# Patient Record
Sex: Female | Born: 1965 | Race: White | Hispanic: No | Smoking: Former smoker
Health system: Southern US, Community
[De-identification: ages and names within clinical notes are randomized; demographics above are authoritative.]

## PROBLEM LIST (undated history)

## (undated) DIAGNOSIS — J45909 Unspecified asthma, uncomplicated: Secondary | ICD-10-CM

## (undated) DIAGNOSIS — I639 Cerebral infarction, unspecified: Secondary | ICD-10-CM

## (undated) DIAGNOSIS — I10 Essential (primary) hypertension: Secondary | ICD-10-CM

## (undated) DIAGNOSIS — D219 Benign neoplasm of connective and other soft tissue, unspecified: Secondary | ICD-10-CM

## (undated) DIAGNOSIS — I5021 Acute systolic (congestive) heart failure: Secondary | ICD-10-CM

## (undated) DIAGNOSIS — R7989 Other specified abnormal findings of blood chemistry: Secondary | ICD-10-CM

## (undated) DIAGNOSIS — E785 Hyperlipidemia, unspecified: Secondary | ICD-10-CM

## (undated) DIAGNOSIS — Z9289 Personal history of other medical treatment: Secondary | ICD-10-CM

## (undated) DIAGNOSIS — F319 Bipolar disorder, unspecified: Secondary | ICD-10-CM

## (undated) DIAGNOSIS — K209 Esophagitis, unspecified without bleeding: Secondary | ICD-10-CM

## (undated) DIAGNOSIS — I771 Stricture of artery: Secondary | ICD-10-CM

## (undated) DIAGNOSIS — E119 Type 2 diabetes mellitus without complications: Secondary | ICD-10-CM

## (undated) DIAGNOSIS — R011 Cardiac murmur, unspecified: Secondary | ICD-10-CM

## (undated) DIAGNOSIS — R0602 Shortness of breath: Secondary | ICD-10-CM

## (undated) DIAGNOSIS — K219 Gastro-esophageal reflux disease without esophagitis: Secondary | ICD-10-CM

## (undated) DIAGNOSIS — K760 Fatty (change of) liver, not elsewhere classified: Secondary | ICD-10-CM

## (undated) DIAGNOSIS — I214 Non-ST elevation (NSTEMI) myocardial infarction: Secondary | ICD-10-CM

## (undated) DIAGNOSIS — I70208 Unspecified atherosclerosis of native arteries of extremities, other extremity: Secondary | ICD-10-CM

## (undated) DIAGNOSIS — Z8719 Personal history of other diseases of the digestive system: Secondary | ICD-10-CM

## (undated) DIAGNOSIS — E559 Vitamin D deficiency, unspecified: Secondary | ICD-10-CM

## (undated) DIAGNOSIS — F419 Anxiety disorder, unspecified: Secondary | ICD-10-CM

## (undated) DIAGNOSIS — K257 Chronic gastric ulcer without hemorrhage or perforation: Secondary | ICD-10-CM

## (undated) DIAGNOSIS — E039 Hypothyroidism, unspecified: Secondary | ICD-10-CM

## (undated) DIAGNOSIS — J42 Unspecified chronic bronchitis: Secondary | ICD-10-CM

## (undated) DIAGNOSIS — G43909 Migraine, unspecified, not intractable, without status migrainosus: Secondary | ICD-10-CM

## (undated) DIAGNOSIS — R945 Abnormal results of liver function studies: Secondary | ICD-10-CM

## (undated) HISTORY — DX: Shortness of breath: R06.02

## (undated) HISTORY — PX: LAPAROSCOPIC CHOLECYSTECTOMY: SUR755

## (undated) HISTORY — DX: Esophagitis, unspecified without bleeding: K20.90

## (undated) HISTORY — DX: Other specified abnormal findings of blood chemistry: R79.89

## (undated) HISTORY — DX: Acute systolic (congestive) heart failure: I50.21

## (undated) HISTORY — DX: Abnormal results of liver function studies: R94.5

---
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
## 2000-05-13 ENCOUNTER — Encounter: Admission: RE | Admit: 2000-05-13 | Discharge: 2000-05-13 | Payer: Self-pay | Admitting: Family Medicine

## 2000-05-13 ENCOUNTER — Encounter: Payer: Self-pay | Admitting: Family Medicine

## 2002-12-17 ENCOUNTER — Ambulatory Visit (HOSPITAL_COMMUNITY): Admission: RE | Admit: 2002-12-17 | Discharge: 2002-12-17 | Payer: Self-pay | Admitting: Gastroenterology

## 2002-12-17 ENCOUNTER — Encounter: Payer: Self-pay | Admitting: Gastroenterology

## 2002-12-19 ENCOUNTER — Ambulatory Visit (HOSPITAL_COMMUNITY): Admission: RE | Admit: 2002-12-19 | Discharge: 2002-12-19 | Payer: Self-pay | Admitting: Gastroenterology

## 2002-12-24 ENCOUNTER — Ambulatory Visit (HOSPITAL_COMMUNITY): Admission: RE | Admit: 2002-12-24 | Discharge: 2002-12-25 | Payer: Self-pay | Admitting: Surgery

## 2006-09-07 ENCOUNTER — Ambulatory Visit: Payer: Self-pay | Admitting: Internal Medicine

## 2006-09-30 ENCOUNTER — Encounter: Admission: RE | Admit: 2006-09-30 | Discharge: 2006-12-29 | Payer: Self-pay | Admitting: Internal Medicine

## 2008-08-02 DIAGNOSIS — I70208 Unspecified atherosclerosis of native arteries of extremities, other extremity: Secondary | ICD-10-CM

## 2008-08-02 DIAGNOSIS — Z9289 Personal history of other medical treatment: Secondary | ICD-10-CM

## 2008-08-02 HISTORY — PX: SUBCLAVIAN STENT PLACEMENT: SUR1038

## 2008-08-02 HISTORY — DX: Personal history of other medical treatment: Z92.89

## 2008-08-02 HISTORY — DX: Unspecified atherosclerosis of native arteries of extremities, other extremity: I70.208

## 2012-01-13 ENCOUNTER — Other Ambulatory Visit: Payer: Self-pay | Admitting: Nephrology

## 2013-08-02 ENCOUNTER — Inpatient Hospital Stay (HOSPITAL_COMMUNITY)
Admission: AD | Admit: 2013-08-02 | Discharge: 2013-08-02 | Disposition: A | Discharge status: Home / Self Care | Payer: BC Managed Care – PPO | Source: Ambulatory Visit | Attending: Obstetrics & Gynecology | Admitting: Obstetrics & Gynecology

## 2013-08-02 ENCOUNTER — Encounter (HOSPITAL_COMMUNITY): Payer: Self-pay | Admitting: *Deleted

## 2013-08-02 DIAGNOSIS — E118 Type 2 diabetes mellitus with unspecified complications: Secondary | ICD-10-CM

## 2013-08-02 DIAGNOSIS — IMO0002 Reserved for concepts with insufficient information to code with codable children: Secondary | ICD-10-CM | POA: Insufficient documentation

## 2013-08-02 DIAGNOSIS — IMO0001 Reserved for inherently not codable concepts without codable children: Secondary | ICD-10-CM

## 2013-08-02 DIAGNOSIS — I10 Essential (primary) hypertension: Secondary | ICD-10-CM | POA: Insufficient documentation

## 2013-08-02 DIAGNOSIS — N730 Acute parametritis and pelvic cellulitis: Secondary | ICD-10-CM | POA: Insufficient documentation

## 2013-08-02 DIAGNOSIS — A5901 Trichomonal vulvovaginitis: Secondary | ICD-10-CM | POA: Insufficient documentation

## 2013-08-02 DIAGNOSIS — D259 Leiomyoma of uterus, unspecified: Secondary | ICD-10-CM

## 2013-08-02 DIAGNOSIS — E1165 Type 2 diabetes mellitus with hyperglycemia: Secondary | ICD-10-CM

## 2013-08-02 DIAGNOSIS — R109 Unspecified abdominal pain: Secondary | ICD-10-CM | POA: Insufficient documentation

## 2013-08-02 HISTORY — DX: Hypothyroidism, unspecified: E03.9

## 2013-08-02 HISTORY — DX: Vitamin D deficiency, unspecified: E55.9

## 2013-08-02 HISTORY — DX: Benign neoplasm of connective and other soft tissue, unspecified: D21.9

## 2013-08-02 HISTORY — DX: Hyperlipidemia, unspecified: E78.5

## 2013-08-02 HISTORY — DX: Essential (primary) hypertension: I10

## 2013-08-02 HISTORY — DX: Unspecified atherosclerosis of native arteries of extremities, other extremity: I70.208

## 2013-08-02 HISTORY — PX: ABDOMINAL HYSTERECTOMY: SHX81

## 2013-08-02 LAB — BASIC METABOLIC PANEL
BUN: 21 mg/dL (ref 6–23)
CALCIUM: 8.7 mg/dL (ref 8.4–10.5)
CHLORIDE: 91 meq/L — AB (ref 96–112)
CO2: 19 meq/L (ref 19–32)
Creatinine, Ser: 0.57 mg/dL (ref 0.50–1.10)
GFR calc Af Amer: >90 mL/min (ref >90)
GFR calc non Af Amer: >90 mL/min (ref >90)
Glucose, Bld: 234 mg/dL — ABNORMAL HIGH (ref 70–99)
Potassium: 3.4 mEq/L — ABNORMAL LOW (ref 3.7–5.3)
SODIUM: 129 meq/L — AB (ref 137–147)

## 2013-08-02 LAB — WET PREP, GENITAL
Trich, Wet Prep: NONE SEEN
Yeast Wet Prep HPF POC: NONE SEEN

## 2013-08-02 LAB — URINALYSIS, ROUTINE W REFLEX MICROSCOPIC
Bilirubin Urine: NEGATIVE
GLUCOSE, UA: 500 mg/dL — AB
KETONES UR: 40 mg/dL — AB
LEUKOCYTES UA: NEGATIVE
NITRITE: POSITIVE — AB
PROTEIN: 100 mg/dL — AB
Specific Gravity, Urine: >1.03 — ABNORMAL HIGH (ref 1.005–1.030)
Urobilinogen, UA: 0.2 mg/dL (ref 0.0–1.0)
pH: 5.5 (ref 5.0–8.0)

## 2013-08-02 LAB — CBC
HCT: 33.5 % — ABNORMAL LOW (ref 36.0–46.0)
Hemoglobin: 10.1 g/dL — ABNORMAL LOW (ref 12.0–15.0)
MCH: 15.7 pg — AB (ref 26.0–34.0)
MCHC: 30.1 g/dL (ref 30.0–36.0)
MCV: 52.1 fL — ABNORMAL LOW (ref 78.0–100.0)
Platelets: 451 10*3/uL — ABNORMAL HIGH (ref 150–400)
RBC: 6.43 MIL/uL — AB (ref 3.87–5.11)
RDW: 23.3 % — ABNORMAL HIGH (ref 11.5–15.5)
WBC: 27.2 10*3/uL — AB (ref 4.0–10.5)

## 2013-08-02 LAB — URINE MICROSCOPIC-ADD ON

## 2013-08-02 MED ORDER — ONDANSETRON 4 MG PO TBDP
4.0000 mg | ORAL_TABLET | Freq: Once | ORAL | Status: AC
  Administered 2013-08-02: 4 mg via ORAL
  Filled 2013-08-02: qty 1

## 2013-08-02 MED ORDER — METRONIDAZOLE 500 MG PO TABS: 500.0000 mg | ORAL_TABLET | Freq: Two times a day (BID) | ORAL | Status: DC

## 2013-08-02 MED ORDER — KETOROLAC TROMETHAMINE 60 MG/2ML IM SOLN
60.0000 mg | Freq: Once | INTRAMUSCULAR | Status: AC
  Administered 2013-08-02: 60 mg via INTRAMUSCULAR
  Filled 2013-08-02: qty 2

## 2013-08-02 MED ORDER — ONDANSETRON 4 MG PO TBDP: 4.0000 mg | ORAL_TABLET | Freq: Four times a day (QID) | ORAL | Status: DC | PRN

## 2013-08-02 MED ORDER — OXYCODONE-ACETAMINOPHEN 5-325 MG PO TABS: 1.0000 | ORAL_TABLET | ORAL | Status: DC | PRN

## 2013-08-02 MED ORDER — LEVOFLOXACIN 500 MG PO TABS: 500.0000 mg | ORAL_TABLET | Freq: Every day | ORAL | Status: DC

## 2013-08-02 MED ORDER — HYDROMORPHONE HCL PF 1 MG/ML IJ SOLN
1.0000 mg | Freq: Once | INTRAMUSCULAR | Status: AC
  Administered 2013-08-02: 1 mg via INTRAMUSCULAR
  Filled 2013-08-02: qty 1

## 2013-08-02 MED ORDER — DOXYCYCLINE HYCLATE 100 MG PO CAPS: 100.0000 mg | ORAL_CAPSULE | Freq: Two times a day (BID) | ORAL | Status: DC

## 2013-08-02 MED ORDER — AZITHROMYCIN 500 MG PO TABS: 500.0000 mg | ORAL_TABLET | Freq: Every day | ORAL | Status: DC

## 2013-08-02 NOTE — Discharge Instructions (Signed)
Pelvic Inflammatory Disease Pelvic inflammatory disease (PID) refers to an infection in some or all of the female organs. The infection can be in the uterus, ovaries, fallopian tubes, or the surrounding tissues in the pelvis. PID can cause abdominal or pelvic pain that comes on suddenly (acute pelvic pain). PID is a serious infection because it can lead to lasting (chronic) pelvic pain or the inability to have children (infertile).  CAUSES  The infection is often caused by the normal bacteria found in the vaginal tissues. PID may also be caused by an infection that is spread during sexual contact. PID can also occur following:   The birth of a baby.   A miscarriage.   An abortion.   Major pelvic surgery.   The use of an intrauterine device (IUD).   A sexual assault.  RISK FACTORS Certain factors can put a person at higher risk for PID, such as:  Being younger than 25 years.  Being sexually active at [place] age.  Usingnonbarrier contraception.  Havingmultiple sexual partners.  Having sex with someone who has symptoms of a genital infection.  Using oral contraception. Other times, certain behaviors can increase the possibility of getting PID, such as:  Having sex during your period.  Using a vaginal douche.  Having an intrauterine device (IUD) in place. SYMPTOMS   Abdominal or pelvic pain.   Fever.   Chills.   Abnormal vaginal discharge.  Abnormal uterine bleeding.   Unusual pain shortly after finishing your period. DIAGNOSIS  Your caregiver will choose some of the following methods to make a diagnosis, such as:   Performinga physical exam and history. A pelvic exam typically reveals a very tender uterus and surrounding pelvis.   Ordering laboratory tests including a pregnancy test, blood tests, and urine test.  Orderingcultures of the vagina and cervix to check for a sexually transmitted infection (STI).  Performing an ultrasound.    Performing a laparoscopic procedure to look inside the pelvis.  TREATMENT   Antibiotic medicines may be prescribed and taken by mouth.   Sexual partners may be treated when the infection is caused by a sexually transmitted disease (STD).   Hospitalization may be needed to give antibiotics intravenously.  Surgery may be needed, but this is rare. It may take weeks until you are completely well. If you are diagnosed with PID, you should also be checked for human immunodeficiency virus (HIV). HOME CARE INSTRUCTIONS   If given, take your antibiotics as directed. Finish the medicine even if you start to feel better.   Only take over-the-counter or prescription medicines for pain, discomfort, or fever as directed by your caregiver.   Do not have sexual intercourse until treatment is completed or as directed by your caregiver. If PID is confirmed, your recent sexual partner(s) will need treatment.   Keep your follow-up appointments. SEEK MEDICAL CARE IF:   You have increased or abnormal vaginal discharge.   You need prescription medicine for your pain.   You vomit.   You cannot take your medicines.   Your partner has an STD.  SEEK IMMEDIATE MEDICAL CARE IF:   You have a fever.   You have increased abdominal or pelvic pain.   You have chills.   You have pain when you urinate.   You are not better after 72 hours following treatment.  MAKE SURE YOU:   Understand these instructions.  Will watch your condition.  Will get help right away if you are not doing well or get worse.  Document Released: 07/19/2005 Document Revised: 11/13/2012 Document Reviewed: 07/15/2011 [facility] Patient Information 2014 [place], [place]. Uterine Fibroid A uterine fibroid is a growth (tumor) that occurs in a woman's uterus. This type of tumor is not cancerous and does not spread out of the uterus. A woman can have one or many fibroids, and the fiboid(s) can become quite large. A  fibroid can vary in size, weight, and where it grows in the uterus. Most fibroids do not require medical treatment, but some can cause pain or heavy bleeding during and between periods. CAUSES  A fibroid is the result of a single uterine cell that keeps growing (unregulated), which is different than most cells in the human body. Most cells have a control mechanism that keeps them from reproducing without control.  SYMPTOMS   Bleeding.  Pelvic pain and pressure.  Bladder problems due to the size of the fibroid.  Infertility and miscarriages depending on the size and location of the fibroid. DIAGNOSIS  A diagnosis is made by physical exam. Your caregiver may feel the lumpy tumors during a pelvic exam. Important information regarding size, location, and number of tumors can be gained by having an ultrasound. It is rare that other tests, such as a CT scan or MRI, are needed. TREATMENT   Your caregiver may recommend watchful waiting. This involves getting the fibroid checked by your caregiver to see if the fibroids grow or shrink.   Hormonal treatment or an intrauterine device (IUD) may be prescribed.   Surgery may be needed to remove the fibroids (myomectomy) or the uterus (hysterectomy). This depends on your situation. When fibroids interfere with fertility and a woman wants to become pregnant, a caregiver may recommend having the fibroids removed.  [place] care depends on how you were treated. In general:   Keep all follow-up appointments with your caregiver.   Only take medicine as told by your caregiver. Do not take aspirin. It can cause bleeding.   If you have excessive periods and soak tampons or pads in a half hour or less, contact your caregiver immediately. If your periods are troublesome but not so heavy, lie down with your feet raised slightly above your heart. Place cold packs on your lower abdomen.   If your periods are heavy, write down the number of  pads or tampons you use per month. Bring this information to your caregiver.   Talk to your caregiver about taking iron pills.   Include green vegetables in your diet.   If you were prescribed a hormonal treatment, take the hormonal medicines as directed.   If you need surgery, ask your caregiver for information on your specific surgery.  SEEK IMMEDIATE MEDICAL CARE IF:  You have pelvic pain or cramps not controlled with medicines.   You have a sudden increase in pelvic pain.   You have an increase of bleeding between and during periods.   You feel lightheaded or have fainting episodes.  MAKE SURE YOU:  Understand these instructions.  Will watch your condition.  Will get help right away if you are not doing well or get worse. Document Released: 07/16/2000 Document Revised: 10/11/2011 Document Reviewed: 02/15/2013 [facility] Patient Information 2014 [place], [place].

## 2013-08-02 NOTE — MAU Note (Signed)
Patient states she has been diagnosed with fibroids at [facility] about 1 1/2 months ago. States she she has abdominal pain for about 3 days and has been bleeding for 5 days. Had Depo on 11-13 and bleeding slowed down but is not heavy again.

## 2013-08-02 NOTE — MAU Provider Note (Signed)
CC: Abdominal Pain and Vaginal Bleeding    First Provider Initiated Contact with Patient 08/02/13 1745      HPI Morgan [name] is a 48 y.o. [id number] who left her job to come to [facility] due to constant severe lower abdominal pain which she attributes to her fibroids. Aleve has been ineffective. In addition she is having heavy vaginal bleeding which has been ongoing for the last 5 days. This morning she had ringing in her ears and felt dizzy but denies orthostatic symptoms at present. She has been vomiting intermittently for the last 3 days due to pain. She's eaten very little. She's not taken her lisinopril or her Lantus and oral hypoglycemic meds for 3 days. She is being followed at [facility] and has records with her. Pelvic ultrasound on 07/04/2013 revealed enlarged uterus 13.6 x 13  x 12 cm and presence of multiple fibroids. 06/15/2013 hemoglobin was 8.8 MCV 50. Endometrial biopsy 06/1713 done for menometrorrhagia was negative for hyperplasia or malignancy. She received Depo-Provera 11/17/ 2014 which initially stopped her bleeding. She began spotting December 1 and bleeding has been progressively heavier over the last 5 days. Appointment with Ob/Gyn is 08/08/12.   Past Medical History  Diagnosis Date  . Fibroid   . Diabetes mellitus without complication     Type 2  . Hypertension   . Hypothyroid   . Hyperlipidemia   . Thalassemia minor   . Vitamin D deficiency   . Occlusion of brachial artery     OB History  Gravida Para Term Preterm AB SAB TAB Ectopic Multiple Living  6 2 2  4 4    2     # Outcome Date GA Lbr Len/2nd Weight Sex Delivery Anes PTL Lv  6 SAB           5 SAB           4 SAB           3 SAB           2 TRM           1 TRM               Past Surgical History  Procedure Laterality Date  . Tonsillectomy    . Knee surgery    . Cesarean section    . Cholecystectomy    . Subclavian stent placement      History   Social History  . Marital Status: Divorced    Spouse  Name: N/A    Number of Children: N/A  . Years of Education: N/A   Occupational History  . Not on file.   Social History Main Topics  . Smoking status: Current Every Day Smoker -- 0.25 packs/day    Types: Cigarettes  . Smokeless tobacco: Never Used  . Alcohol Use: No  . Drug Use: Not on file  . Sexual Activity: Not on file   Other Topics Concern  . Not on file   Social History Narrative  . No narrative on file    No current facility-administered medications on file prior to encounter.   No current outpatient prescriptions on file prior to encounter.    Allergies  Allergen Reactions  . Ilosone [Erythromycin] Anaphylaxis  . Keflex [Cephalexin] Anaphylaxis  . Penicillins Anaphylaxis  . Iron Other (See Comments)    Pt reports condition limiting Iron intake.  . Metformin And Related Other (See Comments)    Pt prefers to not take this medication  . Morphine  And Related Other (See Comments)    Pt reports hallucinations    ROS Pertinent items in HPI  PHYSICAL EXAM Filed Vitals:   08/02/13 1700  BP: 142/103  Pulse: 107  Temp:   Resp: 16   Filed Vitals:   08/02/13 1634 08/02/13 1700 08/02/13 1810  BP: 201/96 142/103 188/85  Pulse: 115 107 111  Temp: 98.4 F (36.9 C)    TempSrc: Oral    Resp: 16 16   Height: 5' 2.5" (1.588 m)    Weight: 78.382 kg (172 lb 12.8 oz)    SpO2: 100%     General: Well nourished, well developed female in moderate distress Cardiovascular: Normal rate Respiratory: Normal effort Abdomen: Firm, mild-moderate TTP lower abd. Fundus 1 FB above U Back: No CVAT Extremities: No edema Neurologic: Alert and oriented Peri-pad with moderate amount dry dark blood Speculum exam: NEFG; vagina with small  blood; cervix with tissue fragments protruding from os: 2 cm and 3 cm fragments removed with sponge stick, malodor noted Bimanual exam:  positive CMT; uterus firm, tender enlarged to 22 wk size; no adnexal tenderness or masses appreciated   LAB  RESULTS Results for orders placed during the hospital encounter of 08/02/13 (from the past 24 hour(s))  CBC     Status: Abnormal   Collection Time    08/02/13  5:58 PM      Result Value Range   WBC 27.2 (*) 4.0 - 10.5 K/uL   RBC 6.43 (*) 3.87 - 5.11 MIL/uL   Hemoglobin 10.1 (*) 12.0 - 15.0 g/dL   HCT 33.5 (*) 36.0 - 46.0 %   MCV 52.1 (*) 78.0 - 100.0 fL   MCH 15.7 (*) 26.0 - 34.0 pg   MCHC 30.1  30.0 - 36.0 g/dL   RDW 23.3 (*) 11.5 - 15.5 %   Platelets 451 (*) 150 - 400 K/uL  BASIC METABOLIC PANEL     Status: Abnormal   Collection Time    08/02/13  5:58 PM      Result Value Range   Sodium 129 (*) 137 - 147 mEq/L   Potassium 3.4 (*) 3.7 - 5.3 mEq/L   Chloride 91 (*) 96 - 112 mEq/L   CO2 19  19 - 32 mEq/L   Glucose, Bld 234 (*) 70 - 99 mg/dL   BUN 21  6 - 23 mg/dL   Creatinine, Ser 0.57  0.50 - 1.10 mg/dL   Calcium 8.7  8.4 - 10.5 mg/dL   GFR calc non Af Amer >90  >90 mL/min   GFR calc Af Amer >90  >90 mL/min  URINALYSIS, ROUTINE W REFLEX MICROSCOPIC     Status: Abnormal   Collection Time    08/02/13  7:20 PM      Result Value Range   Color, Urine YELLOW  YELLOW   APPearance CLEAR  CLEAR   Specific Gravity, Urine >1.030 (*) 1.005 - 1.030   pH 5.5  5.0 - 8.0   Glucose, UA 500 (*) NEGATIVE mg/dL   Hgb urine dipstick LARGE (*) NEGATIVE   Bilirubin Urine NEGATIVE  NEGATIVE   Ketones, ur 40 (*) NEGATIVE mg/dL   Protein, ur 100 (*) NEGATIVE mg/dL   Urobilinogen, UA 0.2  0.0 - 1.0 mg/dL   Nitrite POSITIVE (*) NEGATIVE   Leukocytes, UA NEGATIVE  NEGATIVE  URINE MICROSCOPIC-ADD ON     Status: Abnormal   Collection Time    08/02/13  7:20 PM      Result Value Range  Squamous Epithelial / LPF FEW (*) RARE   WBC, UA 3-6  <3 WBC/hpf   RBC / HPF 21-50  <3 RBC/hpf   Bacteria, UA MANY (*) RARE   Casts HYALINE CASTS (*) NEGATIVE   Urine-Other TRICHOMONAS PRESENT    WET PREP, GENITAL     Status: Abnormal   Collection Time    08/02/13  7:32 PM      Result Value Range   Yeast Wet  Prep HPF POC NONE SEEN  NONE SEEN   Trich, Wet Prep NONE SEEN  NONE SEEN   Clue Cells Wet Prep HPF POC FEW (*) NONE SEEN   WBC, Wet Prep HPF POC MODERATE (*) NONE SEEN    IMAGING No results found.  MAU COURSE Toradol 60 mg IM @1805 >ineffective Zofran 4 mg ODT given Dilaudid 1  Mg IM at 1820> pain improved to 3/10 GC/CT sent  PO fluids pushed  ASSESSMENT  1. Fibroid (bleeding) (uterine)   2. Hypertension, poor control   3. Diabetes mellitus out of control   4. PID (acute pelvic inflammatory disease)   5. Trichomonal vaginitis     PLAN Consulted with Dr. Harolyn Rutherford: will treat empirically for PID Discharge home. See AVS for patient education. Zofran if needed to tolerate chronic meds    Medication List         aspirin 81 MG tablet  Take 81 mg by mouth daily.     azithromycin 500 MG tablet  Commonly known as:  ZITHROMAX  Take 1 tablet (500 mg total) by mouth daily.     glipiZIDE 10 MG 24 hr tablet  Commonly known as:  GLUCOTROL XL  Take 10 mg by mouth daily with breakfast.     insulin glargine 100 UNIT/ML injection  Commonly known as:  LANTUS  Inject 60 Units into the skin at bedtime.     levofloxacin 500 MG tablet  Commonly known as:  LEVAQUIN  Take 1 tablet (500 mg total) by mouth daily.     lisinopril 10 MG tablet  Commonly known as:  PRINIVIL,ZESTRIL  Take 10 mg by mouth daily.     metroNIDAZOLE 500 MG tablet  Commonly known as:  FLAGYL  Take 1 tablet (500 mg total) by mouth 2 (two) times daily.     multivitamin with minerals Tabs tablet  Take 1 tablet by mouth daily.     naproxen sodium 220 MG tablet  Commonly known as:  ANAPROX  Take 220 mg by mouth 2 (two) times daily as needed (pain).     ondansetron 4 MG disintegrating tablet  Commonly known as:  ZOFRAN ODT  Take 1 tablet (4 mg total) by mouth every 6 (six) hours as needed for nausea.     pravastatin 40 MG tablet  Commonly known as:  PRAVACHOL  Take 40 mg by mouth daily.     Vitamin D  (Ergocalciferol) 50000 UNITS Caps capsule  Commonly known as:  DRISDOL  Take 50,000 Units by mouth every 7 (seven) days.          Lorene Dy, CNM 08/02/2013 5:46 PM

## 2013-08-03 ENCOUNTER — Emergency Department (HOSPITAL_COMMUNITY): Payer: BC Managed Care – PPO

## 2013-08-03 ENCOUNTER — Encounter (HOSPITAL_COMMUNITY): Payer: Self-pay | Admitting: Emergency Medicine

## 2013-08-03 ENCOUNTER — Emergency Department (HOSPITAL_COMMUNITY)
Admission: EM | Admit: 2013-08-03 | Discharge: 2013-08-04 | Disposition: A | Discharge status: Other Acute Inpatient Hospital | Payer: BC Managed Care – PPO | Attending: Emergency Medicine | Admitting: Emergency Medicine

## 2013-08-03 DIAGNOSIS — Z792 Long term (current) use of antibiotics: Secondary | ICD-10-CM | POA: Insufficient documentation

## 2013-08-03 DIAGNOSIS — R109 Unspecified abdominal pain: Secondary | ICD-10-CM | POA: Insufficient documentation

## 2013-08-03 DIAGNOSIS — R112 Nausea with vomiting, unspecified: Secondary | ICD-10-CM | POA: Insufficient documentation

## 2013-08-03 DIAGNOSIS — N898 Other specified noninflammatory disorders of vagina: Secondary | ICD-10-CM | POA: Insufficient documentation

## 2013-08-03 DIAGNOSIS — R197 Diarrhea, unspecified: Secondary | ICD-10-CM | POA: Insufficient documentation

## 2013-08-03 DIAGNOSIS — D259 Leiomyoma of uterus, unspecified: Secondary | ICD-10-CM | POA: Insufficient documentation

## 2013-08-03 DIAGNOSIS — N739 Female pelvic inflammatory disease, unspecified: Secondary | ICD-10-CM

## 2013-08-03 DIAGNOSIS — I10 Essential (primary) hypertension: Secondary | ICD-10-CM | POA: Insufficient documentation

## 2013-08-03 DIAGNOSIS — Z79899 Other long term (current) drug therapy: Secondary | ICD-10-CM | POA: Insufficient documentation

## 2013-08-03 DIAGNOSIS — N939 Abnormal uterine and vaginal bleeding, unspecified: Secondary | ICD-10-CM

## 2013-08-03 DIAGNOSIS — R42 Dizziness and giddiness: Secondary | ICD-10-CM | POA: Insufficient documentation

## 2013-08-03 DIAGNOSIS — N730 Acute parametritis and pelvic cellulitis: Secondary | ICD-10-CM | POA: Insufficient documentation

## 2013-08-03 DIAGNOSIS — F17200 Nicotine dependence, unspecified, uncomplicated: Secondary | ICD-10-CM | POA: Insufficient documentation

## 2013-08-03 DIAGNOSIS — Z3202 Encounter for pregnancy test, result negative: Secondary | ICD-10-CM | POA: Insufficient documentation

## 2013-08-03 DIAGNOSIS — E119 Type 2 diabetes mellitus without complications: Secondary | ICD-10-CM | POA: Insufficient documentation

## 2013-08-03 DIAGNOSIS — Z9089 Acquired absence of other organs: Secondary | ICD-10-CM | POA: Insufficient documentation

## 2013-08-03 DIAGNOSIS — R630 Anorexia: Secondary | ICD-10-CM | POA: Insufficient documentation

## 2013-08-03 DIAGNOSIS — E559 Vitamin D deficiency, unspecified: Secondary | ICD-10-CM | POA: Insufficient documentation

## 2013-08-03 DIAGNOSIS — Z7982 Long term (current) use of aspirin: Secondary | ICD-10-CM | POA: Insufficient documentation

## 2013-08-03 DIAGNOSIS — Z880 Allergy status to penicillin: Secondary | ICD-10-CM | POA: Insufficient documentation

## 2013-08-03 DIAGNOSIS — E785 Hyperlipidemia, unspecified: Secondary | ICD-10-CM | POA: Insufficient documentation

## 2013-08-03 DIAGNOSIS — D72829 Elevated white blood cell count, unspecified: Secondary | ICD-10-CM

## 2013-08-03 LAB — BASIC METABOLIC PANEL
BUN: 25 mg/dL — ABNORMAL HIGH (ref 6–23)
CO2: 19 mEq/L (ref 19–32)
CREATININE: 0.58 mg/dL (ref 0.50–1.10)
Calcium: 9.3 mg/dL (ref 8.4–10.5)
Chloride: 90 mEq/L — ABNORMAL LOW (ref 96–112)
GFR calc non Af Amer: >90 mL/min (ref >90)
Glucose, Bld: 296 mg/dL — ABNORMAL HIGH (ref 70–99)
Potassium: 3.5 mEq/L — ABNORMAL LOW (ref 3.7–5.3)
SODIUM: 126 meq/L — AB (ref 137–147)

## 2013-08-03 LAB — CBC WITH DIFFERENTIAL/PLATELET
Basophils Absolute: 0 10*3/uL (ref 0.0–0.1)
Basophils Relative: 0 % (ref 0–1)
Eosinophils Absolute: 0 10*3/uL (ref 0.0–0.7)
Eosinophils Relative: 0 % (ref 0–5)
HCT: 31.2 % — ABNORMAL LOW (ref 36.0–46.0)
Hemoglobin: 9.3 g/dL — ABNORMAL LOW (ref 12.0–15.0)
Lymphocytes Relative: 2 % — ABNORMAL LOW (ref 12–46)
Lymphs Abs: 0.8 10*3/uL (ref 0.7–4.0)
MCH: 15.8 pg — ABNORMAL LOW (ref 26.0–34.0)
MCHC: 29.8 g/dL — ABNORMAL LOW (ref 30.0–36.0)
MCV: 52.9 fL — ABNORMAL LOW (ref 78.0–100.0)
Monocytes Absolute: 2 10*3/uL — ABNORMAL HIGH (ref 0.1–1.0)
Monocytes Relative: 5 % (ref 3–12)
Neutro Abs: 37 10*3/uL — ABNORMAL HIGH (ref 1.7–7.7)
Neutrophils Relative %: 93 % — ABNORMAL HIGH (ref 43–77)
Platelets: 392 10*3/uL (ref 150–400)
RBC: 5.9 MIL/uL — ABNORMAL HIGH (ref 3.87–5.11)
RDW: 22.8 % — ABNORMAL HIGH (ref 11.5–15.5)
WBC Morphology: INCREASED
WBC: 39.8 10*3/uL — ABNORMAL HIGH (ref 4.0–10.5)

## 2013-08-03 MED ORDER — HYDROMORPHONE HCL PF 1 MG/ML IJ SOLN
1.0000 mg | Freq: Once | INTRAMUSCULAR | Status: AC
Start: 2013-08-03 — End: 2013-08-03
  Administered 2013-08-03: 1 mg via INTRAVENOUS
  Filled 2013-08-03: qty 1

## 2013-08-03 MED ORDER — IOHEXOL 300 MG/ML  SOLN
100.0000 mL | Freq: Once | INTRAMUSCULAR | Status: AC | PRN
  Administered 2013-08-03: 100 mL via INTRAVENOUS

## 2013-08-03 MED ORDER — IOHEXOL 300 MG/ML  SOLN
25.0000 mL | Freq: Once | INTRAMUSCULAR | Status: AC | PRN
  Administered 2013-08-03: 25 mL via ORAL

## 2013-08-03 MED ORDER — HYDROMORPHONE HCL PF 1 MG/ML IJ SOLN
1.0000 mg | Freq: Once | INTRAMUSCULAR | Status: AC
  Administered 2013-08-03: 1 mg via INTRAVENOUS
  Filled 2013-08-03: qty 1

## 2013-08-03 MED ORDER — SODIUM CHLORIDE 0.9 % IV BOLUS (SEPSIS)
1000.0000 mL | Freq: Once | INTRAVENOUS | Status: AC
  Administered 2013-08-03: 1000 mL via INTRAVENOUS

## 2013-08-03 NOTE — Progress Notes (Signed)
   CARE MANAGEMENT ED NOTE 08/03/2013  Patient:  Morgan [name], Morgan [name]   Account Number:  [id number]  Date Initiated:  08/03/2013  Documentation initiated by:  Livia Snellen  Subjective/Objective Assessment:   Patient presents to Ed with abdominal pain and heavy vaginal bleeding.     Subjective/Objective Assessment Detail:     Action/Plan:   Action/Plan Detail:   Anticipated DC Date:       Status Recommendation to Physician:   Result of Recommendation:    Other ED [place]  Other  PCP issues    Choice offered to / List presented to:            Status of service:  Completed, signed off  ED Comments:   ED Comments Detail:  Patient confirms her pcp is Dr. Donato Heinz at [facility] updated.

## 2013-08-03 NOTE — ED Notes (Signed)
Pt. Taken to CT without having pregnancy test complete. CT made aware.

## 2013-08-03 NOTE — ED Notes (Signed)
Pt c/o abd pain since yesterday; states having heavy vaginal bleeding due to fibroid tumors;

## 2013-08-03 NOTE — MAU Provider Note (Signed)
Attestation of Attending Supervision of Advanced Practitioner (PA/CNM/NP): Evaluation and management procedures were performed by the Advanced Practitioner under my supervision and collaboration.  I have reviewed the Advanced Practitioner's note and chart, and I agree with the management and plan.  Prabhjot Maddux, MD, FACOG Attending Obstetrician & Gynecologist Faculty Practice, [facility]  

## 2013-08-03 NOTE — ED Notes (Signed)
Pt BIB EMS. Pt c/o abdominal pain. Pt was dx'ed with fibroid tumors and cysts on uterus and ovaries. Pt has had pain for the past 4 days. Pt has also been nauseous. Pt was seen at [facility] yesterday for same c/o.

## 2013-08-03 NOTE — ED Provider Notes (Signed)
CSN: [id number]     Arrival date & time 08/03/13  1826 History   First MD Initiated Contact with Patient 08/03/13 1952     Chief Complaint  Patient presents with  . Abdominal Pain    HPI  Morgan [name] is a 48 y.o. female with a PMH of DM, HTN, HLD, fibroid, hypothyroidism, thalassemia, occlusion of the brachial artery who presents to the ED for evaluation of abdominal pain.  History was provided by the patient.  Patient has had heavy vaginal bleeding for the past 5 years.  She received a Depo shot on 06/18/13 which improved her bleeding until 5 days ago.  She reports large blood clots which are larger than a quarter.  She has been changing her pads sometimes every hour depending on how heavy she is bleeding.  She denies any vaginal discharge, genital sores, dysuria, or hematuria.  She was seen at the [facility] health clinic yesterday and received a pelvic exam.  She was treated for possible PID and UTI and discharged home on azithromycin, zofran, oxycodone, flagyl, and Levaquin.  She states she filled these prescriptions but her pain has not been controlled on oxycodone.  She also states that the bleeding has not stopped but also has not worsened.  She has lightheadedness with ambulation however denies this currently.  No syncope.  She had a pelvic US on 07/04/13 which showed an enlarged uterus and multiple fibroids.  She also had a biopsy on 06/18/13 which was negative for malignancy or hyperplasia.  She currently has unchanged middle lower pelvic pain with radiation to her lower abdomen diffusely and sometimes her lower back bilaterally.  She describes an intermittent cramping pain.  She has an appointment to establish care with an OB/GYN at [facility] on 08/08/12 (Dr. Elizabeth Sauer).  She has been seeing a TEFL teacher in the meantime. No fevers, chills.  She has had a few episodes of emesis due to pain but denies nausea currently.  She had one episode of diarrhea but this has not been ongoing.  She  has had decreased appetite and intake due to pain and nausea.  Morgan [name]   Past Medical History  Diagnosis Date  . Fibroid   . Diabetes mellitus without complication     Type 2  . Hypertension   . Hypothyroid   . Hyperlipidemia   . Thalassemia minor   . Vitamin D deficiency   . Occlusion of brachial artery    Past Surgical History  Procedure Laterality Date  . Tonsillectomy    . Knee surgery    . Cesarean section    . Cholecystectomy    . Subclavian stent placement     No family history on file. History  Substance Use Topics  . Smoking status: Current Every Day Smoker -- 0.25 packs/day    Types: Cigarettes  . Smokeless tobacco: Never Used  . Alcohol Use: No   OB History   Grav Para Term Preterm Abortions TAB SAB Ect Mult Living   6 2 2  4  4   2      Review of Systems  Constitutional: Positive for appetite change. Negative for fever, chills, diaphoresis, activity change and fatigue.  HENT: Negative for congestion, rhinorrhea and sore throat.   Eyes: Negative for visual disturbance.  Respiratory: Negative for cough and shortness of breath.   Cardiovascular: Negative for chest pain and leg swelling.  Gastrointestinal: Positive for nausea, vomiting, abdominal pain and diarrhea. Negative for constipation and blood in stool.  Genitourinary: Positive for vaginal bleeding and pelvic pain. Negative for dysuria, urgency, frequency, hematuria, flank pain, decreased urine volume, vaginal discharge, difficulty urinating, genital sores and vaginal pain.  Musculoskeletal: Negative for back pain and myalgias.  Neurological: Positive for light-headedness. Negative for dizziness, syncope, weakness, numbness and headaches.    Allergies  Ilosone; Keflex; Penicillins; Iron; Metformin and related; and Morphine and related  Home Medications   Current Outpatient Rx  Name  Route  Sig  Dispense  Refill  . aspirin 81 MG tablet   Oral   Take 81 mg by mouth daily.         [name] azithromycin  (ZITHROMAX) 500 MG tablet   Oral   Take 1 tablet (500 mg total) by mouth daily.   4 tablet   0     Take all 4 tabs at once Note: She has taken this  ...   . glipiZIDE (GLUCOTROL XL) 10 MG 24 hr tablet   Oral   Take 10 mg by mouth daily with breakfast.         . insulin glargine (LANTUS) 100 UNIT/ML injection   Subcutaneous   Inject 60 Units into the skin at bedtime.         [name] levofloxacin (LEVAQUIN) 500 MG tablet   Oral   Take 1 tablet (500 mg total) by mouth daily.   14 tablet   0   . lisinopril (PRINIVIL,ZESTRIL) 10 MG tablet   Oral   Take 10 mg by mouth daily.         . metroNIDAZOLE (FLAGYL) 500 MG tablet   Oral   Take 1 tablet (500 mg total) by mouth 2 (two) times daily.   28 tablet   0   . Multiple Vitamin (MULTIVITAMIN WITH MINERALS) TABS tablet   Oral   Take 1 tablet by mouth daily.         . naproxen sodium (ANAPROX) 220 MG tablet   Oral   Take 220 mg by mouth 2 (two) times daily as needed (pain).         . ondansetron (ZOFRAN ODT) 4 MG disintegrating tablet   Oral   Take 1 tablet (4 mg total) by mouth every 6 (six) hours as needed for nausea.   20 tablet   0   . oxyCODONE-acetaminophen (PERCOCET/ROXICET) 5-325 MG per tablet   Oral   Take 1 tablet by mouth every 4 (four) hours as needed.   20 tablet   0   . pravastatin (PRAVACHOL) 40 MG tablet   Oral   Take 40 mg by mouth daily.         . Vitamin D, Ergocalciferol, (DRISDOL) 50000 UNITS CAPS capsule   Oral   Take 50,000 Units by mouth every 7 (seven) days.          BP 116/92  Pulse 116  Temp(Src) 98.5 F (36.9 C)  Resp 20  SpO2 100%  LMP 08/03/2013  Filed Vitals:   08/03/13 2354 08/04/13 0229 08/04/13 0258 08/04/13 0427  BP: 105/51  113/55 95/59  Pulse: 102  86 98  Temp: 98.5 F (36.9 C)  98.2 F (36.8 C)   TempSrc: Oral  Oral   Resp: 18  18 19   Height:  5\' 3"  (1.6 m)    Weight:  172 lb 13.5 oz (78.4 kg)    SpO2: 99%  98% 98%    Physical Exam  Vitals  reviewed. Constitutional: She is oriented to person, place, and time. She appears  well-developed and well-nourished. No distress.  HENT:  Head: Normocephalic and atraumatic.  Right Ear: External ear normal.  Left Ear: External ear normal.  Nose: Nose normal.  Mouth/Throat: Oropharynx is clear and moist.  Eyes: Conjunctivae are normal. Right eye exhibits no discharge. Left eye exhibits no discharge.  Neck: Normal range of motion. Neck supple.  Cardiovascular: Normal rate, regular rhythm and normal heart sounds.  Exam reveals no gallop and no friction rub.   No murmur heard. Pulmonary/Chest: Effort normal and breath sounds normal. No respiratory distress. She has no wheezes. She has no rales. She exhibits no tenderness.  Abdominal: Soft. She exhibits mass. She exhibits no distension. There is tenderness. There is no rebound and no guarding.  Tenderness to palpation to the lower middle abdomen and pelvis.  Large firm non-mobile mass palpated in the lower middle abdomen and pelvis about 6 cm x 4 cm   Musculoskeletal: Normal range of motion. She exhibits no edema and no tenderness.  Neurological: She is alert and oriented to person, place, and time.  Skin: Skin is warm and dry. She is not diaphoretic.    ED Course  Procedures (including critical care time) Labs Review Labs Reviewed  CBC WITH DIFFERENTIAL   Imaging Review No results found.  EKG Interpretation   None      Results for orders placed during the hospital encounter of 08/03/13  CBC WITH DIFFERENTIAL      Result Value Range   WBC 39.8 (*) 4.0 - 10.5 K/uL   RBC 5.90 (*) 3.87 - 5.11 MIL/uL   Hemoglobin 9.3 (*) 12.0 - 15.0 g/dL   HCT 31.2 (*) 36.0 - 46.0 %   MCV 52.9 (*) 78.0 - 100.0 fL   MCH 15.8 (*) 26.0 - 34.0 pg   MCHC 29.8 (*) 30.0 - 36.0 g/dL   RDW 22.8 (*) 11.5 - 15.5 %   Platelets 392  150 - 400 K/uL   Neutrophils Relative % 93 (*) 43 - 77 %   Lymphocytes Relative 2 (*) 12 - 46 %   Monocytes Relative 5  3 - 12  %   Eosinophils Relative 0  0 - 5 %   Basophils Relative 0  0 - 1 %   Neutro Abs 37.0 (*) 1.7 - 7.7 K/uL   Lymphs Abs 0.8  0.7 - 4.0 K/uL   Monocytes Absolute 2.0 (*) 0.1 - 1.0 K/uL   Eosinophils Absolute 0.0  0.0 - 0.7 K/uL   Basophils Absolute 0.0  0.0 - 0.1 K/uL   RBC Morphology ELLIPTOCYTES     WBC Morphology INCREASED BANDS (>20% BANDS)     Smear Review PLATELET COUNT CONFIRMED BY SMEAR    BASIC METABOLIC PANEL      Result Value Range   Sodium 126 (*) 137 - 147 mEq/L   Potassium 3.5 (*) 3.7 - 5.3 mEq/L   Chloride 90 (*) 96 - 112 mEq/L   CO2 19  19 - 32 mEq/L   Glucose, Bld 296 (*) 70 - 99 mg/dL   BUN 25 (*) 6 - 23 mg/dL   Creatinine, Ser 0.58  0.50 - 1.10 mg/dL   Calcium 9.3  8.4 - 10.5 mg/dL   GFR calc non Af Amer >90  >90 mL/min   GFR calc Af Amer >90  >90 mL/min  PREGNANCY, URINE      Result Value Range   Preg Test, Ur NEGATIVE  NEGATIVE  URINALYSIS, ROUTINE W REFLEX MICROSCOPIC      Result Value  Range   Color, Urine YELLOW  YELLOW   APPearance CLOUDY (*) CLEAR   Specific Gravity, Urine 1.029  1.005 - 1.030   pH 6.0  5.0 - 8.0   Glucose, UA >1000 (*) NEGATIVE mg/dL   Hgb urine dipstick LARGE (*) NEGATIVE   Bilirubin Urine NEGATIVE  NEGATIVE   Ketones, ur 15 (*) NEGATIVE mg/dL   Protein, ur 30 (*) NEGATIVE mg/dL   Urobilinogen, UA 0.2  0.0 - 1.0 mg/dL   Nitrite POSITIVE (*) NEGATIVE   Leukocytes, UA MODERATE (*) NEGATIVE  URINE MICROSCOPIC-ADD ON      Result Value Range   WBC, UA 11-20  <3 WBC/hpf   RBC / HPF TOO NUMEROUS TO COUNT  <3 RBC/hpf   Bacteria, UA MANY (*) RARE     CT Abdomen Pelvis W Contrast (Final result)  Result time: 08/03/13 23:30:05    Final result by Rad Results In Interface (08/03/13 23:30:05)    Narrative:   CLINICAL DATA: Lower pelvic pain and vaginal bleeding.  EXAM: CT ABDOMEN AND PELVIS WITH CONTRAST  TECHNIQUE: Multidetector CT imaging of the abdomen and pelvis was performed using the standard protocol following bolus  administration of intravenous contrast.  CONTRAST: 33mL OMNIPAQUE IOHEXOL 300 MG/ML SOLN, 153mL OMNIPAQUE IOHEXOL 300 MG/ML SOLN  COMPARISON: None.  FINDINGS: The lung bases are clear. Small hiatal hernia.  The solid abdominal organs are unremarkable. No focal lesions or inflammatory changes. There is mild right-sided hydronephrosis and hydroureter. No obstructing ureteral calculi. There is moderate mass effect on the right ureter by a large pelvic mass. Mild nodular enlargement of the left adrenal gland but no focal mass. No biliary dilatation. The gallbladder is surgically absent.  The stomach, duodenum, small bowel and colon are unremarkable. No inflammatory changes, mass lesions or obstructive findings. No mesenteric or retroperitoneal mass or adenopathy. The aorta is normal in caliber. Mild to moderate distal atherosclerotic changes.  The uterus is markedly enlarged this appears to be due to numerous large necrotic fibroids. The visualized endometrial cavity appears grossly normal. Cervix appears normal. The bladder is normal. No pelvic lymphadenopathy or free pelvic fluid collections. No inguinal mass or adenopathy.  The bony structures are intact.  IMPRESSION: Markedly enlarged fibroid uterus measuring 19 x 16.5 x 11.5 cm. Several fibroids are necrotic in appearance. The endometrium appears grossly normal and the cervix is normal. Both ovaries are normal.  The right ureter is compressed between the uterus and the right psoas muscle causing moderate hydroureteronephrosis.   Electronically Signed By: Kalman Jewels M.D. On: 08/03/2013 23:30         MDM   Morgan [name] is a 48 y.o. female with a PMH of DM, HTN, HLD, fibroid, hypothyroidism, thalassemia, occlusion of the brachial artery who presents to the ED for evaluation of abdominal pain.  Rechecks  9:45 PM = Pelvic exam performed at bedside with ED tech.  Mild-moderate amount of BRB in the vaginal vault.   Two small dime sized blood clots.  No vaginal discharge.  No CMT or adnexal tenderness.  Os is open with no active bleeding or hemorrhage.  Pain much improved per patient.    11:45 PM = Pain returning.  1 mg dilaudid ordered.   1:24 AM = Pain 2/10.  Had one episode of emesis.   2:40 AM = Patient started on clindamycin and gentamicin for possible PID vs endometriosis   Consults  1:20 AM = Spoke with Dr. Mora Bellman who states that the patient can follow-up  on Monday.  No change to antibiotic regimen.     2:30 AM = Dr. Venora Maples spoke with ED physician Dr. Hurley Cisco at [facility] who accepts transfer (ED to ED).  OB/GYN will be consulted and evaluate the patient.      Patient transferred to [facility] for further evaluation and management of her abdominal pain likely due to her uterine fibroids.  CT scan shows necrosis of the fibroids as well as a large uterus.  She was afebrile in the ED however had leukocytosis of 39.8 on tody's visit.  She also was found to have a UTI.  She started azithromycin, flagyl, and levaquin yesterday.  She was given clindamycin and gentamicin in the ED.  Patient had passage of large blood clots on exam but had no CMT or hemorrhage.  H&H dropped one since yesterday but is stable.  Patient's pain not well controlled with OP therapy (oxycodone) but was well controlled in the ED with dilaudid.  Patient transferred for further evaluation and management.  Patient in agreement with transfer and plan.     Final impressions: 1. Abdominal pain   2. Leucocytosis   3. Uterine fibroid   4. Vaginal bleeding   5. Pelvic inflammatory disease (PID)      Mercy Moore PA-C   This patient was discussed with Dr. Shirlee Latch, PA-C 08/04/13 1554

## 2013-08-04 LAB — URINE CULTURE: Colony Count: >=100000

## 2013-08-04 LAB — URINALYSIS, ROUTINE W REFLEX MICROSCOPIC
BILIRUBIN URINE: NEGATIVE
Glucose, UA: >1000 mg/dL — AB
KETONES UR: 15 mg/dL — AB
NITRITE: POSITIVE — AB
Protein, ur: 30 mg/dL — AB
Specific Gravity, Urine: 1.029 (ref 1.005–1.030)
UROBILINOGEN UA: 0.2 mg/dL (ref 0.0–1.0)
pH: 6 (ref 5.0–8.0)

## 2013-08-04 LAB — URINE MICROSCOPIC-ADD ON

## 2013-08-04 LAB — PREGNANCY, URINE: Preg Test, Ur: NEGATIVE

## 2013-08-04 MED ORDER — GENTAMICIN SULFATE 40 MG/ML IJ SOLN
310.0000 mg | Freq: Once | INTRAVENOUS | Status: AC
  Administered 2013-08-04: 310 mg via INTRAVENOUS
  Filled 2013-08-04: qty 7.75

## 2013-08-04 MED ORDER — ONDANSETRON HCL 4 MG/2ML IJ SOLN
INTRAMUSCULAR | Status: AC
  Filled 2013-08-04: qty 2

## 2013-08-04 MED ORDER — CLINDAMYCIN PHOSPHATE 900 MG/50ML IV SOLN
900.0000 mg | Freq: Once | INTRAVENOUS | Status: AC
  Administered 2013-08-04: 900 mg via INTRAVENOUS
  Filled 2013-08-04 (×2): qty 50

## 2013-08-04 MED ORDER — HYDROMORPHONE HCL PF 1 MG/ML IJ SOLN
INTRAMUSCULAR | Status: AC
  Filled 2013-08-04: qty 1

## 2013-08-04 MED ORDER — SODIUM CHLORIDE 0.9 % IV BOLUS (SEPSIS)
1000.0000 mL | Freq: Once | INTRAVENOUS | Status: AC
  Administered 2013-08-04: 1000 mL via INTRAVENOUS

## 2013-08-04 MED ORDER — ONDANSETRON HCL 4 MG/2ML IJ SOLN
4.0000 mg | Freq: Once | INTRAMUSCULAR | Status: AC
  Administered 2013-08-04: 4 mg via INTRAVENOUS

## 2013-08-04 MED ORDER — HYDROMORPHONE HCL PF 1 MG/ML IJ SOLN
1.0000 mg | Freq: Once | INTRAMUSCULAR | Status: AC
Start: 2013-08-04 — End: 2013-08-04
  Administered 2013-08-04: 1 mg via INTRAVENOUS

## 2013-08-04 NOTE — ED Notes (Addendum)
Pt vomited up large amount of green liquid. Pt sts. She is no longer nauseous. Pt says the nausea typically comes on fast and then it's over quickly after vomiting.

## 2013-08-04 NOTE — ED Provider Notes (Signed)
Medical screening examination/treatment/procedure(s) were conducted as a shared visit with non-physician practitioner(s) and myself.  I personally evaluated the patient during the encounter.  EKG Interpretation   None       Pt has necrotic fibroids on CT.  Pt's hgb has dropped one point and her wbc has elevated now to 39k.  Will consult with gyn regarding admission for pain management, possible hysterectomy.  Kathalene Frames, MD 08/04/13 [phone]

## 2013-08-04 NOTE — ED Provider Notes (Signed)
2:50 AM Attempted to contact gynecology at [facility] but was unable to get through.  I discussed her case with the emergency physician at [place] Dr. Francesco Runner who accepts the patient in transfer.  I suspect the patient has worsening developing ascending infection and and questionable endometritis given her worsening abdominal pain and elevated white blood cell count.  Patient will receive and the mycin and gentamicin in the ER.  Additional pain medications given.  CT scan reviewed.  Ct Abdomen Pelvis W Contrast  08/03/2013   CLINICAL DATA:  Lower pelvic pain and vaginal bleeding.  EXAM: CT ABDOMEN AND PELVIS WITH CONTRAST  TECHNIQUE: Multidetector CT imaging of the abdomen and pelvis was performed using the standard protocol following bolus administration of intravenous contrast.  CONTRAST:  7mL OMNIPAQUE IOHEXOL 300 MG/ML SOLN, 133mL OMNIPAQUE IOHEXOL 300 MG/ML SOLN  COMPARISON:  None.  FINDINGS: The lung bases are clear.  Small hiatal hernia.  The solid abdominal organs are unremarkable. No focal lesions or inflammatory changes. There is mild right-sided hydronephrosis and hydroureter. No obstructing ureteral calculi. There is moderate mass effect on the right ureter by a large pelvic mass. Mild nodular enlargement of the left adrenal gland but no focal mass. No biliary dilatation. The gallbladder is surgically absent.  The stomach, duodenum, small bowel and colon are unremarkable. No inflammatory changes, mass lesions or obstructive findings. No mesenteric or retroperitoneal mass or adenopathy. The aorta is normal in caliber. Mild to moderate distal atherosclerotic changes.  The uterus is markedly enlarged this appears to be due to numerous large necrotic fibroids. The visualized endometrial cavity appears grossly normal. Cervix appears normal. The bladder is normal. No pelvic lymphadenopathy or free pelvic fluid collections. No inguinal mass or adenopathy.  The bony structures are  intact.  IMPRESSION: Markedly enlarged fibroid uterus measuring 19 x 16.5 x 11.5 cm. Several fibroids are necrotic in appearance. The endometrium appears grossly normal and the cervix is normal. Both ovaries are normal.  The right ureter is compressed between the uterus and the right psoas muscle causing moderate hydroureteronephrosis.   Electronically Signed   By: Kalman Jewels M.D.   On: 08/03/2013 23:30  I personally reviewed the imaging tests through PACS system I reviewed available ER/hospitalization records through the EMR   Results for orders placed during the hospital encounter of 08/03/13  CBC WITH DIFFERENTIAL      Result Value Range   WBC 39.8 (*) 4.0 - 10.5 K/uL   RBC 5.90 (*) 3.87 - 5.11 MIL/uL   Hemoglobin 9.3 (*) 12.0 - 15.0 g/dL   HCT 31.2 (*) 36.0 - 46.0 %   MCV 52.9 (*) 78.0 - 100.0 fL   MCH 15.8 (*) 26.0 - 34.0 pg   MCHC 29.8 (*) 30.0 - 36.0 g/dL   RDW 22.8 (*) 11.5 - 15.5 %   Platelets 392  150 - 400 K/uL   Neutrophils Relative % 93 (*) 43 - 77 %   Lymphocytes Relative 2 (*) 12 - 46 %   Monocytes Relative 5  3 - 12 %   Eosinophils Relative 0  0 - 5 %   Basophils Relative 0  0 - 1 %   Neutro Abs 37.0 (*) 1.7 - 7.7 K/uL   Lymphs Abs 0.8  0.7 - 4.0 K/uL   Monocytes Absolute 2.0 (*) 0.1 - 1.0 K/uL   Eosinophils Absolute 0.0  0.0 - 0.7 K/uL   Basophils Absolute 0.0  0.0 - 0.1 K/uL  RBC Morphology ELLIPTOCYTES     WBC Morphology INCREASED BANDS (>20% BANDS)     Smear Review PLATELET COUNT CONFIRMED BY SMEAR    BASIC METABOLIC PANEL      Result Value Range   Sodium 126 (*) 137 - 147 mEq/L   Potassium 3.5 (*) 3.7 - 5.3 mEq/L   Chloride 90 (*) 96 - 112 mEq/L   CO2 19  19 - 32 mEq/L   Glucose, Bld 296 (*) 70 - 99 mg/dL   BUN 25 (*) 6 - 23 mg/dL   Creatinine, Ser 0.58  0.50 - 1.10 mg/dL   Calcium 9.3  8.4 - 10.5 mg/dL   GFR calc non Af Amer >90  >90 mL/min   GFR calc Af Amer >90  >90 mL/min  PREGNANCY, URINE      Result Value Range   Preg Test, Ur NEGATIVE   NEGATIVE  URINALYSIS, ROUTINE W REFLEX MICROSCOPIC      Result Value Range   Color, Urine YELLOW  YELLOW   APPearance CLOUDY (*) CLEAR   Specific Gravity, Urine 1.029  1.005 - 1.030   pH 6.0  5.0 - 8.0   Glucose, UA >1000 (*) NEGATIVE mg/dL   Hgb urine dipstick LARGE (*) NEGATIVE   Bilirubin Urine NEGATIVE  NEGATIVE   Ketones, ur 15 (*) NEGATIVE mg/dL   Protein, ur 30 (*) NEGATIVE mg/dL   Urobilinogen, UA 0.2  0.0 - 1.0 mg/dL   Nitrite POSITIVE (*) NEGATIVE   Leukocytes, UA MODERATE (*) NEGATIVE  URINE MICROSCOPIC-ADD ON      Result Value Range   WBC, UA 11-20  <3 WBC/hpf   RBC / HPF TOO NUMEROUS TO COUNT  <3 RBC/hpf   Bacteria, UA MANY (*) RARE     Hoy Morn, MD 08/04/13 [phone]

## 2013-08-05 LAB — GC/CHLAMYDIA PROBE AMP
CT Probe RNA: NEGATIVE
GC Probe RNA: NEGATIVE

## 2013-08-06 LAB — URINE CULTURE

## 2013-08-16 ENCOUNTER — Encounter: Payer: Self-pay | Admitting: Obstetrics & Gynecology

## 2013-09-17 ENCOUNTER — Encounter: Payer: BC Managed Care – PPO | Admitting: Obstetrics & Gynecology

## 2014-06-03 ENCOUNTER — Encounter (HOSPITAL_COMMUNITY): Payer: Self-pay | Admitting: Emergency Medicine

## 2016-05-13 ENCOUNTER — Encounter (HOSPITAL_COMMUNITY): Payer: Self-pay | Admitting: Emergency Medicine

## 2016-05-13 ENCOUNTER — Observation Stay (HOSPITAL_COMMUNITY)
Admission: EM | Admit: 2016-05-13 | Discharge: 2016-05-16 | Disposition: A | Discharge status: Home / Self Care | Payer: 59 | Attending: Internal Medicine | Admitting: Internal Medicine

## 2016-05-13 ENCOUNTER — Emergency Department (HOSPITAL_COMMUNITY): Payer: 59

## 2016-05-13 DIAGNOSIS — I10 Essential (primary) hypertension: Secondary | ICD-10-CM | POA: Diagnosis not present

## 2016-05-13 DIAGNOSIS — Z794 Long term (current) use of insulin: Secondary | ICD-10-CM | POA: Diagnosis not present

## 2016-05-13 DIAGNOSIS — E039 Hypothyroidism, unspecified: Secondary | ICD-10-CM | POA: Diagnosis present

## 2016-05-13 DIAGNOSIS — Z7984 Long term (current) use of oral hypoglycemic drugs: Secondary | ICD-10-CM | POA: Insufficient documentation

## 2016-05-13 DIAGNOSIS — F17210 Nicotine dependence, cigarettes, uncomplicated: Secondary | ICD-10-CM | POA: Diagnosis not present

## 2016-05-13 DIAGNOSIS — E119 Type 2 diabetes mellitus without complications: Secondary | ICD-10-CM | POA: Insufficient documentation

## 2016-05-13 DIAGNOSIS — I639 Cerebral infarction, unspecified: Secondary | ICD-10-CM

## 2016-05-13 DIAGNOSIS — H53451 Other localized visual field defect, right eye: Secondary | ICD-10-CM

## 2016-05-13 DIAGNOSIS — Z7982 Long term (current) use of aspirin: Secondary | ICD-10-CM | POA: Insufficient documentation

## 2016-05-13 DIAGNOSIS — R0789 Other chest pain: Secondary | ICD-10-CM | POA: Diagnosis not present

## 2016-05-13 DIAGNOSIS — R4182 Altered mental status, unspecified: Secondary | ICD-10-CM | POA: Diagnosis not present

## 2016-05-13 DIAGNOSIS — E785 Hyperlipidemia, unspecified: Secondary | ICD-10-CM | POA: Diagnosis present

## 2016-05-13 DIAGNOSIS — R079 Chest pain, unspecified: Secondary | ICD-10-CM | POA: Diagnosis present

## 2016-05-13 LAB — CBC WITH DIFFERENTIAL/PLATELET
Basophils Absolute: 0.1 10*3/uL (ref 0.0–0.1)
Basophils Relative: 1 %
EOS PCT: 4 %
Eosinophils Absolute: 0.5 10*3/uL (ref 0.0–0.7)
HEMATOCRIT: 46.8 % — AB (ref 36.0–46.0)
HEMOGLOBIN: 15.4 g/dL — AB (ref 12.0–15.0)
LYMPHS ABS: 3.8 10*3/uL (ref 0.7–4.0)
Lymphocytes Relative: 33 %
MCH: 20.7 pg — ABNORMAL LOW (ref 26.0–34.0)
MCHC: 32.9 g/dL (ref 30.0–36.0)
MCV: 62.8 fL — ABNORMAL LOW (ref 78.0–100.0)
Monocytes Absolute: 0.8 10*3/uL (ref 0.1–1.0)
Monocytes Relative: 7 %
NEUTROS PCT: 55 %
Neutro Abs: 6.2 10*3/uL (ref 1.7–7.7)
Platelets: 323 10*3/uL (ref 150–400)
RBC: 7.45 MIL/uL — AB (ref 3.87–5.11)
RDW: 16.6 % — ABNORMAL HIGH (ref 11.5–15.5)
WBC: 11.4 10*3/uL — AB (ref 4.0–10.5)

## 2016-05-13 LAB — I-STAT TROPONIN, ED: Troponin i, poc: 0 ng/mL (ref 0.00–0.08)

## 2016-05-13 LAB — COMPREHENSIVE METABOLIC PANEL
ALBUMIN: 4 g/dL (ref 3.5–5.0)
ALK PHOS: 101 U/L (ref 38–126)
ALT: 48 U/L (ref 14–54)
AST: 56 U/L — AB (ref 15–41)
Anion gap: 11 (ref 5–15)
BILIRUBIN TOTAL: 0.7 mg/dL (ref 0.3–1.2)
BUN: 7 mg/dL (ref 6–20)
CALCIUM: 9.4 mg/dL (ref 8.9–10.3)
CO2: 23 mmol/L (ref 22–32)
Chloride: 99 mmol/L — ABNORMAL LOW (ref 101–111)
Creatinine, Ser: 0.7 mg/dL (ref 0.44–1.00)
GFR calc Af Amer: >60 mL/min (ref >60)
GFR calc non Af Amer: >60 mL/min (ref >60)
GLUCOSE: 180 mg/dL — AB (ref 65–99)
Potassium: 3.3 mmol/L — ABNORMAL LOW (ref 3.5–5.1)
Sodium: 133 mmol/L — ABNORMAL LOW (ref 135–145)
TOTAL PROTEIN: 7 g/dL (ref 6.5–8.1)

## 2016-05-13 MED ORDER — ASPIRIN 81 MG PO CHEW
324.0000 mg | CHEWABLE_TABLET | Freq: Once | ORAL | Status: AC
  Administered 2016-05-14: 324 mg via ORAL
  Filled 2016-05-13: qty 4

## 2016-05-13 NOTE — ED Provider Notes (Signed)
[facility] DEPT Provider Note   CSN: [id number] Arrival date & time: 05/13/16  1955  By signing my name below, I, Evelene Croon, attest that this documentation has been prepared under the direction and in the presence of Ripley Fraise, MD . Electronically Signed: Evelene Croon, Scribe. 05/14/2016. 12:01 AM.  History   Chief Complaint Chief Complaint  Patient presents with  . Chest Tightness  . Fatigue    The history is provided by the patient. No language interpreter was used.  Chest Pain   This is a new problem. The current episode started 12 to 24 hours ago. The problem has not changed since onset.The pain is associated with exertion. The quality of the pain is described as heavy. The pain does not radiate. Associated symptoms include cough, diaphoresis and shortness of breath. Pertinent negatives include no abdominal pain, no lower extremity edema and no vomiting. She has tried nothing for the symptoms. Risk factors include obesity.  Her past medical history is significant for diabetes, hyperlipidemia and hypertension.  Pertinent negatives for past medical history include no MI.    HPI Comments:  Morgan [name] is a 50 y.o. female with a history of DM, HTN, PAD, and HLD,  who presents to the Emergency Department complaining of chest heaviness x a few days. Pt reports associated fatigue x a few weeks, SOB, diaphoresis and occasional cough. Her symptoms are exacerbated with exertion; states she feels heavy when ambulating. Daughter states pt has a h/o similar symptoms when diagnosed with a blood clot in the LUE (brachial artery). No alleviating factors noted. She denies vomiting, abdominal pain and extremity swelling. She also denies h/o MI. Pt is a former smoker; quit in 2015. She has a FHx of MI.    Past Medical History:  Diagnosis Date  . Diabetes mellitus without complication (HCC)    Type 2  . Fibroid   . Hyperlipidemia   . Hypertension   . Hypothyroid   . 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 Thalassemia minor   . Vitamin D deficiency     There are no active problems to display for this patient.   Past Surgical History:  Procedure Laterality Date  . CESAREAN SECTION    . CHOLECYSTECTOMY    . KNEE SURGERY    . SUBCLAVIAN STENT PLACEMENT    . TONSILLECTOMY      OB History    Gravida Para Term Preterm AB Living   6 2 2   4 2    SAB TAB Ectopic Multiple Live Births   4               Home Medications    Prior to Admission medications   Medication Sig Start Date End Date Taking? Authorizing Provider  aspirin 81 MG tablet Take 81 mg by mouth daily.    Historical Provider, MD  azithromycin (ZITHROMAX) 500 MG tablet Take 1 tablet (500 mg total) by mouth daily. 08/02/13   Deirdre C Poe, CNM  glipiZIDE (GLUCOTROL XL) 10 MG 24 hr tablet Take 10 mg by mouth daily with breakfast.    Historical Provider, MD  insulin glargine (LANTUS) 100 UNIT/ML injection Inject 60 Units into the skin at bedtime.    Historical Provider, MD  levofloxacin (LEVAQUIN) 500 MG tablet Take 1 tablet (500 mg total) by mouth daily. 08/02/13   Deirdre C Poe, CNM  lisinopril (PRINIVIL,ZESTRIL) 10 MG tablet Take 10 mg by mouth daily.    Historical Provider, MD  metroNIDAZOLE (FLAGYL)  500 MG tablet Take 1 tablet (500 mg total) by mouth 2 (two) times daily. 08/02/13   Deirdre Freida Busman, CNM  Multiple Vitamin (MULTIVITAMIN WITH MINERALS) TABS tablet Take 1 tablet by mouth daily.    Historical Provider, MD  naproxen sodium (ANAPROX) 220 MG tablet Take 220 mg by mouth 2 (two) times daily as needed (pain).    Historical Provider, MD  ondansetron (ZOFRAN ODT) 4 MG disintegrating tablet Take 1 tablet (4 mg total) by mouth every 6 (six) hours as needed for nausea. 08/02/13   Deirdre Freida Busman, CNM  oxyCODONE-acetaminophen (PERCOCET/ROXICET) 5-325 MG per tablet Take 1 tablet by mouth every 4 (four) hours as needed. 08/02/13   Deirdre C Poe, CNM  pravastatin (PRAVACHOL) 40 MG tablet Take 40 mg by mouth daily.     Historical Provider, MD  Vitamin D, Ergocalciferol, (DRISDOL) 50000 UNITS CAPS capsule Take 50,000 Units by mouth every 7 (seven) days.    Historical Provider, MD    Family History No family history on file.  Social History Social History  Substance Use Topics  . Smoking status: Current Every Day Smoker    Types: Cigarettes  . Smokeless tobacco: Never Used  . Alcohol use No     Allergies   Ilosone [erythromycin]; Keflex [cephalexin]; Penicillins; Iron; Metformin and related; and Morphine and related   Review of Systems Review of Systems  Constitutional: Positive for diaphoresis and fatigue.  Respiratory: Positive for cough and shortness of breath.   Cardiovascular: Positive for chest pain. Negative for leg swelling.  Gastrointestinal: Negative for abdominal pain and vomiting.  All other systems reviewed and are negative.   Physical Exam Updated Vital Signs BP 107/95   Pulse 106   Temp 99 F (37.2 C) (Oral)   Resp 20   LMP 08/03/2013   SpO2 98%   Physical Exam CONSTITUTIONAL: Well developed/well nourished HEAD: Normocephalic/atraumatic EYES: EOMI/PERRL ENMT: Mucous membranes moist NECK: supple no meningeal signs SPINE/BACK:entire spine nontender CV: S1/S2 noted, no murmurs/rubs/gallops noted LUNGS: Lungs are clear to auscultation bilaterally, no apparent distress ABDOMEN: soft, nontender, no rebound or guarding, bowel sounds noted throughout abdomen GU:no cva tenderness NEURO: Pt is awake/alert/appropriate, moves all extremitiesx4.  No facial droop.   EXTREMITIES: pulses normal/equal, full ROM SKIN: warm, color normal PSYCH: no abnormalities of mood noted, alert and oriented to situation   ED Treatments / Results  DIAGNOSTIC STUDIES:  Oxygen Saturation is 98% on RA, normal by my interpretation.    COORDINATION OF CARE:  12:00 AM Discussed treatment plan with pt at bedside and pt agreed to plan.  Labs (all labs ordered are listed, but only abnormal  results are displayed) Labs Reviewed  CBC WITH DIFFERENTIAL/PLATELET - Abnormal; Notable for the following:       Result Value   WBC 11.4 (*)    RBC 7.45 (*)    Hemoglobin 15.4 (*)    HCT 46.8 (*)    MCV 62.8 (*)    MCH 20.7 (*)    RDW 16.6 (*)    All other components within normal limits  COMPREHENSIVE METABOLIC PANEL - Abnormal; Notable for the following:    Sodium 133 (*)    Potassium 3.3 (*)    Chloride 99 (*)    Glucose, Bld 180 (*)    AST 56 (*)    All other components within normal limits  Randolm Idol, ED    EKG  EKG Interpretation  Date/Time:  Thursday May 13 2016 20:13:18 EDT Ventricular Rate:  104  PR Interval:  124 QRS Duration: 82 QT Interval:  372 QTC Calculation: 489 R Axis:   60 Text Interpretation:  Sinus tachycardia Otherwise normal ECG No significant change since last tracing Confirmed by Bulpitt ([id number]) on 05/13/2016 11:47:14 PM       Radiology Dg Chest 2 View  Result Date: 05/13/2016 CLINICAL DATA:  Subacute onset of generalized chest tightness, shortness of breath, fatigue, fever and cough. Initial encounter. EXAM: CHEST  2 VIEW COMPARISON:  CT of the chest performed 11/13/2007 FINDINGS: The lungs are well-aerated and clear. There is no evidence of focal opacification, pleural effusion or pneumothorax. The heart is normal in size; the mediastinal contour is within normal limits. No acute osseous abnormalities are seen. Clips are noted within the right upper quadrant, reflecting prior cholecystectomy. IMPRESSION: No acute cardiopulmonary process seen. Electronically Signed   By: Garald Balding M.D.   On: 05/13/2016 20:55    Procedures Procedures (including critical care time)  Medications Ordered in ED Medications  aspirin chewable tablet 324 mg (324 mg Oral Given 05/14/16 0015)  sodium chloride 0.9 % bolus 1,000 mL (1,000 mLs Intravenous New Bag/Given 05/14/16 0029)     Initial Impression / Assessment and Plan / ED Course   I have reviewed the triage vital signs and the nursing notes.  Pertinent labs & imaging results that were available during my care of the patient were reviewed by me and considered in my medical decision making (see chart for details).  Clinical Course    Pt stable High risk for CAD with multiple risk factors She is feeling improved Will need admission as story is very concerning with progressively worsening chest tightness Doubt Pe as no pleuritic CP reported.  She has h/o PAD with brachial artery occlusion   D/w dr Olevia Bowens for admission  Final Clinical Impressions(s) / ED Diagnoses   Final diagnoses:  Chest tightness  Chest pain, rule out acute myocardial infarction    New Prescriptions New Prescriptions   No medications on file   I personally performed the services described in this documentation, which was scribed in my presence. The recorded information has been reviewed and is accurate.        Ripley Fraise, MD 05/14/16 [phone]

## 2016-05-13 NOTE — ED Triage Notes (Signed)
Patient reports chest tightness/heaviness with mild SOB and fatigue for several days , denies nausea or diaphoresis .

## 2016-05-14 ENCOUNTER — Observation Stay (HOSPITAL_COMMUNITY): Payer: 59

## 2016-05-14 ENCOUNTER — Observation Stay (HOSPITAL_BASED_OUTPATIENT_CLINIC_OR_DEPARTMENT_OTHER): Payer: 59

## 2016-05-14 ENCOUNTER — Encounter (HOSPITAL_COMMUNITY): Payer: Self-pay | Admitting: Internal Medicine

## 2016-05-14 DIAGNOSIS — R0789 Other chest pain: Secondary | ICD-10-CM | POA: Diagnosis not present

## 2016-05-14 DIAGNOSIS — R079 Chest pain, unspecified: Secondary | ICD-10-CM | POA: Diagnosis present

## 2016-05-14 DIAGNOSIS — E039 Hypothyroidism, unspecified: Secondary | ICD-10-CM | POA: Diagnosis present

## 2016-05-14 DIAGNOSIS — E785 Hyperlipidemia, unspecified: Secondary | ICD-10-CM | POA: Diagnosis present

## 2016-05-14 DIAGNOSIS — I10 Essential (primary) hypertension: Secondary | ICD-10-CM | POA: Diagnosis present

## 2016-05-14 LAB — COMPREHENSIVE METABOLIC PANEL
ALT: 37 U/L (ref 14–54)
ANION GAP: 8 (ref 5–15)
AST: 40 U/L (ref 15–41)
Albumin: 3.1 g/dL — ABNORMAL LOW (ref 3.5–5.0)
Alkaline Phosphatase: 74 U/L (ref 38–126)
BUN: 6 mg/dL (ref 6–20)
CALCIUM: 8.4 mg/dL — AB (ref 8.9–10.3)
CHLORIDE: 109 mmol/L (ref 101–111)
CO2: 20 mmol/L — AB (ref 22–32)
Creatinine, Ser: 0.65 mg/dL (ref 0.44–1.00)
GFR calc non Af Amer: >60 mL/min (ref >60)
Glucose, Bld: 234 mg/dL — ABNORMAL HIGH (ref 65–99)
Potassium: 3 mmol/L — ABNORMAL LOW (ref 3.5–5.1)
SODIUM: 137 mmol/L (ref 135–145)
Total Bilirubin: 0.7 mg/dL (ref 0.3–1.2)
Total Protein: 5 g/dL — ABNORMAL LOW (ref 6.5–8.1)

## 2016-05-14 LAB — GLUCOSE, CAPILLARY
GLUCOSE-CAPILLARY: 75 mg/dL (ref 65–99)
GLUCOSE-CAPILLARY: 145 mg/dL — AB (ref 65–99)
GLUCOSE-CAPILLARY: 126 mg/dL — AB (ref 65–99)
Glucose-Capillary: 285 mg/dL — ABNORMAL HIGH (ref 65–99)

## 2016-05-14 LAB — CBC
HCT: 42.1 % (ref 36.0–46.0)
HCT: 39.4 % (ref 36.0–46.0)
Hemoglobin: 13.3 g/dL (ref 12.0–15.0)
Hemoglobin: 12.6 g/dL (ref 12.0–15.0)
MCH: 20.1 pg — ABNORMAL LOW (ref 26.0–34.0)
MCH: 20.2 pg — AB (ref 26.0–34.0)
MCHC: 31.6 g/dL (ref 30.0–36.0)
MCHC: 32 g/dL (ref 30.0–36.0)
MCV: 63.6 fL — ABNORMAL LOW (ref 78.0–100.0)
MCV: 63.2 fL — ABNORMAL LOW (ref 78.0–100.0)
PLATELETS: 238 10*3/uL (ref 150–400)
Platelets: 267 10*3/uL (ref 150–400)
RBC: 6.62 MIL/uL — ABNORMAL HIGH (ref 3.87–5.11)
RBC: 6.23 MIL/uL — ABNORMAL HIGH (ref 3.87–5.11)
RDW: 15.7 % — AB (ref 11.5–15.5)
RDW: 16 % — AB (ref 11.5–15.5)
WBC: 12.9 10*3/uL — ABNORMAL HIGH (ref 4.0–10.5)
WBC: 9.1 10*3/uL (ref 4.0–10.5)

## 2016-05-14 LAB — NM MYOCAR MULTI W/SPECT W/WALL MOTION / EF
CSEPEDS: 54 s
CSEPEW: 1 METS
CSEPHR: 65 %
CSEPPHR: 110 {beats}/min
Exercise duration (min): 6 min
MPHR: 168 {beats}/min
Rest HR: 86 {beats}/min

## 2016-05-14 LAB — URINALYSIS, ROUTINE W REFLEX MICROSCOPIC
BILIRUBIN URINE: NEGATIVE
Glucose, UA: >1000 mg/dL — AB
Hgb urine dipstick: NEGATIVE
Ketones, ur: NEGATIVE mg/dL
Leukocytes, UA: NEGATIVE
NITRITE: NEGATIVE
PH: 6 (ref 5.0–8.0)
Protein, ur: NEGATIVE mg/dL
SPECIFIC GRAVITY, URINE: 1.035 — AB (ref 1.005–1.030)

## 2016-05-14 LAB — CBG MONITORING, ED: Glucose-Capillary: 78 mg/dL (ref 65–99)

## 2016-05-14 LAB — TROPONIN I: Troponin I: <0.03 ng/mL (ref <0.03)

## 2016-05-14 LAB — URINE MICROSCOPIC-ADD ON: RBC / HPF: NONE SEEN RBC/hpf (ref 0–5)

## 2016-05-14 LAB — CREATININE, SERUM
CREATININE: 0.66 mg/dL (ref 0.44–1.00)
GFR calc Af Amer: >60 mL/min (ref >60)

## 2016-05-14 MED ORDER — POTASSIUM CHLORIDE CRYS ER 20 MEQ PO TBCR
40.0000 meq | EXTENDED_RELEASE_TABLET | Freq: Two times a day (BID) | ORAL | Status: AC
  Administered 2016-05-14 – 2016-05-15 (×2): 40 meq via ORAL
  Filled 2016-05-14 (×3): qty 2

## 2016-05-14 MED ORDER — ACETAMINOPHEN 325 MG PO TABS: 650.0000 mg | ORAL_TABLET | Freq: Four times a day (QID) | ORAL | Status: DC | PRN

## 2016-05-14 MED ORDER — FLUOXETINE HCL 20 MG PO CAPS
40.0000 mg | ORAL_CAPSULE | Freq: Every day | ORAL | Status: DC
  Administered 2016-05-14 – 2016-05-16 (×3): 40 mg via ORAL
  Filled 2016-05-14 (×4): qty 2

## 2016-05-14 MED ORDER — INSULIN STARTER KIT- PEN NEEDLES (ENGLISH)
1.0000 | Freq: Once | Status: AC
  Administered 2016-05-14: 1
  Filled 2016-05-14: qty 1

## 2016-05-14 MED ORDER — VITAMIN D 1000 UNITS PO TABS
2000.0000 [IU] | ORAL_TABLET | Freq: Every day | ORAL | Status: DC
  Administered 2016-05-14 – 2016-05-16 (×3): 2000 [IU] via ORAL
  Filled 2016-05-14 (×3): qty 2

## 2016-05-14 MED ORDER — POTASSIUM CHLORIDE IN NACL 40-0.9 MEQ/L-% IV SOLN
INTRAVENOUS | Status: AC
  Administered 2016-05-14: 125 mL/h via INTRAVENOUS
  Filled 2016-05-14: qty 1000

## 2016-05-14 MED ORDER — PANTOPRAZOLE SODIUM 40 MG PO TBEC
40.0000 mg | DELAYED_RELEASE_TABLET | Freq: Every day | ORAL | Status: DC
  Administered 2016-05-14 – 2016-05-16 (×3): 40 mg via ORAL
  Filled 2016-05-14 (×3): qty 1

## 2016-05-14 MED ORDER — LEVOTHYROXINE SODIUM 75 MCG PO TABS
75.0000 ug | ORAL_TABLET | Freq: Every day | ORAL | Status: DC
  Administered 2016-05-14 – 2016-05-16 (×3): 75 ug via ORAL
  Filled 2016-05-14 (×3): qty 1

## 2016-05-14 MED ORDER — LIVING WELL WITH DIABETES BOOK
Freq: Once | Status: AC
  Administered 2016-05-14: 15:00:00
  Filled 2016-05-14: qty 1

## 2016-05-14 MED ORDER — INFLUENZA VAC SPLIT QUAD 0.5 ML IM SUSY
0.5000 mL | PREFILLED_SYRINGE | INTRAMUSCULAR | Status: AC
  Administered 2016-05-16: 0.5 mL via INTRAMUSCULAR

## 2016-05-14 MED ORDER — ASPIRIN EC 81 MG PO TBEC
81.0000 mg | DELAYED_RELEASE_TABLET | Freq: Every day | ORAL | Status: DC
Start: 2016-05-14 — End: 2016-05-16
  Administered 2016-05-14 – 2016-05-16 (×3): 81 mg via ORAL
  Filled 2016-05-14 (×3): qty 1

## 2016-05-14 MED ORDER — REGADENOSON 0.4 MG/5ML IV SOLN
0.4000 mg | Freq: Once | INTRAVENOUS | Status: AC
Start: 2016-05-14 — End: 2016-05-14
  Administered 2016-05-14: 0.4 mg via INTRAVENOUS
  Filled 2016-05-14: qty 5

## 2016-05-14 MED ORDER — TOPIRAMATE 100 MG PO TABS
50.0000 mg | ORAL_TABLET | Freq: Two times a day (BID) | ORAL | Status: DC
  Administered 2016-05-14 – 2016-05-16 (×4): 50 mg via ORAL
  Filled 2016-05-14 (×4): qty 1

## 2016-05-14 MED ORDER — TECHNETIUM TC 99M TETROFOSMIN IV KIT
10.0000 | PACK | Freq: Once | INTRAVENOUS | Status: AC | PRN
  Administered 2016-05-14: 10 via INTRAVENOUS

## 2016-05-14 MED ORDER — ENOXAPARIN SODIUM 40 MG/0.4ML ~~LOC~~ SOLN
40.0000 mg | SUBCUTANEOUS | Status: DC
  Administered 2016-05-14 – 2016-05-16 (×3): 40 mg via SUBCUTANEOUS
  Filled 2016-05-14 (×3): qty 0.4

## 2016-05-14 MED ORDER — SODIUM CHLORIDE 0.9% FLUSH
3.0000 mL | Freq: Two times a day (BID) | INTRAVENOUS | Status: DC
  Administered 2016-05-14 – 2016-05-16 (×4): 3 mL via INTRAVENOUS

## 2016-05-14 MED ORDER — PRAVASTATIN SODIUM 40 MG PO TABS
40.0000 mg | ORAL_TABLET | Freq: Every day | ORAL | Status: DC
  Administered 2016-05-14 – 2016-05-16 (×3): 40 mg via ORAL
  Filled 2016-05-14 (×3): qty 1

## 2016-05-14 MED ORDER — LIRAGLUTIDE 18 MG/3ML ~~LOC~~ SOPN
1.2000 mg | PEN_INJECTOR | SUBCUTANEOUS | Status: DC
  Administered 2016-05-15 – 2016-05-16 (×2): 1.2 mg via SUBCUTANEOUS
  Filled 2016-05-14 (×2): qty 3

## 2016-05-14 MED ORDER — ONDANSETRON HCL 4 MG PO TABS: 4.0000 mg | ORAL_TABLET | Freq: Four times a day (QID) | ORAL | Status: DC | PRN

## 2016-05-14 MED ORDER — SODIUM CHLORIDE 0.9 % IV BOLUS (SEPSIS)
1000.0000 mL | Freq: Once | INTRAVENOUS | Status: AC
  Administered 2016-05-14: 1000 mL via INTRAVENOUS

## 2016-05-14 MED ORDER — LOSARTAN POTASSIUM 50 MG PO TABS
100.0000 mg | ORAL_TABLET | Freq: Every day | ORAL | Status: DC
  Administered 2016-05-14 – 2016-05-16 (×3): 100 mg via ORAL
  Filled 2016-05-14 (×3): qty 2

## 2016-05-14 MED ORDER — REGADENOSON 0.4 MG/5ML IV SOLN
INTRAVENOUS | Status: AC
  Administered 2016-05-14: 0.4 mg via INTRAVENOUS
  Filled 2016-05-14: qty 5

## 2016-05-14 MED ORDER — INSULIN ASPART PROT & ASPART (70-30 MIX) 100 UNIT/ML ~~LOC~~ SUSP
100.0000 [IU] | Freq: Two times a day (BID) | SUBCUTANEOUS | Status: DC
  Administered 2016-05-14 – 2016-05-16 (×3): 100 [IU] via SUBCUTANEOUS
  Filled 2016-05-14 (×2): qty 10

## 2016-05-14 MED ORDER — ALPRAZOLAM 0.25 MG PO TABS: 0.2500 mg | ORAL_TABLET | Freq: Two times a day (BID) | ORAL | Status: DC | PRN

## 2016-05-14 MED ORDER — ONDANSETRON HCL 4 MG/2ML IJ SOLN: 4.0000 mg | Freq: Four times a day (QID) | INTRAMUSCULAR | Status: DC | PRN

## 2016-05-14 MED ORDER — TECHNETIUM TC 99M TETROFOSMIN IV KIT
30.0000 | PACK | Freq: Once | INTRAVENOUS | Status: AC | PRN
  Administered 2016-05-14: 30 via INTRAVENOUS

## 2016-05-14 MED ORDER — CANAGLIFLOZIN 100 MG PO TABS
100.0000 mg | ORAL_TABLET | Freq: Every day | ORAL | Status: DC
  Administered 2016-05-15: 100 mg via ORAL
  Filled 2016-05-14 (×3): qty 1

## 2016-05-14 NOTE — Progress Notes (Signed)
NUTRITION EDUCATION NOTE  Consulted by physician for Diabetes and Heart Healthy diet education.  Provided Morgan [name] with "Type 2 Diabetes Nutrition Therapy" and "Low-Sodium Nutrition Therapy" handouts from the Academy of [employer] and [employer]etic's Nutrition Care Manual. RD and Intern contact information provided with handout.  Spoke with Morgan [name] and daughter at bedside. Per Diabetes Coordinator note, Morgan [name] has lost 40# since August on the ketogenic diet. Diabetes Coordinator discussed basic diabetes diet nutrition principles with Morgan [name] prior to visit.  Morgan [name] states she does not salt her food and does not consume most processed food due to intolerance. Morgan [name] states she is aware of foods containing sodium. Morgan [name] reports seasoning her food with spices, lemon, and lime. Morgan [name] does not eat at restaurants regularly and cooks most of her meals at home.  Morgan [name] states she wants to continue following the ketogenic diet after discharge. Spoke with Morgan [name] about the importance of consuming carbohydrates throughout the day as it relates to blood glucose control. Encouraged Morgan [name] to eat carbohydrate-containing foods at each meal. Morgan [name] states she ate mostly meat PTA. Morgan [name] willing to try pairing each serving of meat with a carbohydrate. Encouraged Morgan [name] to do this. Discussed sources of carbohydrates: bread, rice, pasta, crackers, starchy vegetables, etc. Morgan [name] knowledgeable about carb-containing foods due to experience with ketogenic diet. Ordered Morgan [name]'s dinner at bedside.  Morgan [name] reports enjoying making chips and crackers with ground cauliflower. Expresses difficulty "controlling myself" with regular chips, cookies, and crackers. Discussed with Morgan [name] how she can navigate these difficulties by purchasing small packages of these items. Morgan [name] agreeable to trying. Morgan [name] expresses having a similar experience with diet sodas. Morgan [name] states she drinks a regular soda "every once in a while" at a slow pace which works better for her. Discussed with Morgan [name] the importance of moderation and the effect on  blood glucose if she chooses to continue consuming regular sodas.  Teach-back method used.  Body mass index is 39.56 kg/m. Categorized as Obesity II.  Expect fair compliance.  There are no other nutrition concerns at this time. If new nutrition needs arise, please re-consult RD.  Jeb Levering Dietetic Intern Pager Number: [phone]

## 2016-05-14 NOTE — ED Notes (Signed)
Transported to [facility] unit in stable condition .

## 2016-05-14 NOTE — Progress Notes (Signed)
Spoke w/ Dr Alfredia Ferguson and patient, reviewed data. Pt w/ 2 week hx CP, became worse yesterday. Admitted overnight, ez neg MI, ECG not acute.  OK to do Myoview.   Lexiscan Myoview performed, no immediate complications. 1 day study, [place] to read.  Rosaria Ferries, Hershal Coria 05/14/2016 10:46 AM Beeper [phone]

## 2016-05-14 NOTE — Progress Notes (Signed)
Patient is a obese 50 yo with a PMH of HTN, HLD, IDDM Type 2, Hypothyroidsim, Thalassemia, and Hx of Left Brachial Arterery occlusion and other comorbids who was admitted overnight after Midnight early morning for CP and H and P was reviewed. Patient underwent Lexiscan Myoview Stress test for her Cp and Cardiology Cleared her. She however complained of Burning on Urination and patient's Potassium was low. Work Up for UTI being done and repletion of Kcl. Diabetic Educator Consulted because of Patient's "ketotic" diet. Continue to Monitor. Repeat Bloodwork in Am.

## 2016-05-14 NOTE — Progress Notes (Signed)
Patient has headache pre and post Lexiscan. Does not want Tylenol at present as she would like to wait to see if her snack relieves her headache.

## 2016-05-14 NOTE — Progress Notes (Signed)
Inpatient Diabetes Program Recommendations  AACE/ADA: New Consensus Statement on Inpatient Glycemic Control (2015)  Target Ranges:  Prepandial:   less than 140 mg/dL      Peak postprandial:   less than 180 mg/dL (1-2 hours)      Critically ill patients:  140 - 180 mg/dL   Lab Results  Component Value Date   GLUCAP 145 (H) 05/14/2016    Review of Glycemic Control  Diabetes history: DM2 Outpatient Diabetes medications: 75/25 100 units bid + Victoza 1.2+ Jardiance 25 Current orders for Inpatient glycemic control: 70/30 100 bid+ Invokana 100 qd+Liraglutide 1.2 qd  Inpatient Diabetes Program Recommendations:  Spoke with patient @ length @ bedside. Patient shared she lost 40 lbs since August on a keto diet and has not seen her physician since to evaluate medication management. States last A1c this year was 10.0 and reviewed with patient what an A1C is, basic pathophysiology of DM Type 2, basic home care, basic diabetes diet nutrition principles, importance of checking CBGs and maintaining good CBG control to prevent long-term and short-term complications. Reviewed signs and symptoms of hyperglycemia and hypoglycemia and how to treat hypoglycemia at home. Also reviewed blood sugar goals at home.  RNs to provide ongoing basic DM education at bedside with this patient. Have ordered educational booklet, insulin starter kit, and DM videos. Patient states willingness to attend outpatient diabetes education and ordered for [facility]. Evaluate insulin dosing this pm. May need to decrease to 70/30 50 units (1/2 home dose) while in the hospital. Will follow.  Thank you, Nani Gasser. Dariella Gillihan, RN, MSN, CDE Inpatient Glycemic Control Team Team Pager [phone] (8am-5pm) 05/14/2016 2:59 PM

## 2016-05-14 NOTE — H&P (Signed)
History and Physical    Morgan [name] [id number] DOB: May 31, 1966 DOA: 05/13/2016  PCP: Bradly Bienenstock., MD   Patient coming from: Home.  Chief Complaint: Chest pressure.  HPI: Morgan [name] is a 50 y.o. female with medical history significant of Type 2 diabetes, fibroids, hyperlipidemia, hypertension, hypothyroidism, history of occlusion of left brachial artery, thalassemia minor, vitamin D deficiency who is coming to the emergency department due to chest pressure.  Per patient, she has been feeling fatigued for several weeks. However, for the past few days she has been experiencing at chest pressure associated with dyspnea, diaphoresis, occasional nonproductive cough worsened by exertion and relieved by rest. The patient states that initially the symptoms were only happening during exertion, but just today symptoms were happening about she was at rest. States that her symptoms are similar to when she had a brachial artery thrombosis. She denies nausea, emesis, palpitations or dizziness. She denies GU symptoms.  ED Course: Patient's EKG shows sinus tachycardia, but otherwise did not show any other abnormalities. Troponin levels have been negative. Sodium 133, potassium 3.3 mm/L, her glucose was 180 mg/dL. Chest radiograph did not show any acute cardiopulmonary disease.   Review of Systems: As per HPI otherwise 10 point review of systems negative.    Past Medical History:  Diagnosis Date  . Diabetes mellitus without complication (HCC)    Type 2  . Fibroid   . Hyperlipidemia   . Hypertension   . Hypothyroid   . 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 Thalassemia minor   . Vitamin D deficiency     Past Surgical History:  Procedure Laterality Date  . CESAREAN SECTION    . CHOLECYSTECTOMY    . KNEE SURGERY    . SUBCLAVIAN STENT PLACEMENT    . TONSILLECTOMY       reports that she quit smoking about 2 years ago. Her smoking use included Cigarettes. She has never  used smokeless tobacco. She reports that she does not drink alcohol or use drugs.  Allergies  Allergen Reactions  . Ilosone [Erythromycin] Anaphylaxis  . Keflex [Cephalexin] Anaphylaxis  . Penicillins Anaphylaxis  . Iron Other (See Comments)    Pt reports condition limiting Iron intake.  . Metformin And Related Other (See Comments)    Pt prefers to not take this medication  . Morphine And Related Other (See Comments)    Pt reports hallucinations    Family History  Problem Relation Age of Onset  . Diabetes Mother   . Hypertension Mother   . Hyperlipidemia Mother   . CAD Mother   . CAD Father   . Heart failure Father   . Hypertension Sister   . Hyperlipidemia Sister   . Hypertension Brother   . Hyperlipidemia Brother     Prior to Admission medications   Medication Sig Start Date End Date Taking? Authorizing Provider  aspirin 81 MG tablet Take 81 mg by mouth every morning.    Yes Historical Provider, MD  cholecalciferol (VITAMIN D) 1000 units tablet Take 2,000 Units by mouth daily.   Yes Historical Provider, MD  empagliflozin (JARDIANCE) 25 MG TABS tablet Take 25 mg by mouth every morning.   Yes Historical Provider, MD  FLUoxetine (PROZAC) 20 MG tablet Take 40 mg by mouth every morning.   Yes Historical Provider, MD  insulin lispro protamine-lispro (HUMALOG 75/25 MIX) (75-25) 100 UNIT/ML SUSP injection Inject 100 Units into the skin 2 (two) times daily.   Yes Historical Provider, MD  levothyroxine (SYNTHROID, LEVOTHROID) 75 MCG tablet Take 75 mcg by mouth daily.   Yes Historical Provider, MD  liraglutide (VICTOZA) 18 MG/3ML SOPN Inject 1.2 mg into the skin every morning.   Yes Historical Provider, MD  losartan (COZAAR) 100 MG tablet Take 100 mg by mouth daily.   Yes Historical Provider, MD  Multiple Vitamin (MULTIVITAMIN WITH MINERALS) TABS tablet Take 1 tablet by mouth daily.   Yes Historical Provider, MD  naproxen sodium (ANAPROX) 220 MG tablet Take 220 mg by mouth 2 (two)  times daily as needed (pain).   Yes Historical Provider, MD  pantoprazole (PROTONIX) 20 MG tablet Take 20 mg by mouth every morning.   Yes Historical Provider, MD  pravastatin (PRAVACHOL) 40 MG tablet Take 40 mg by mouth daily.   Yes Historical Provider, MD  topiramate (TOPAMAX) 25 MG tablet Take 50 mg by mouth 2 (two) times daily.   Yes Historical Provider, MD    Physical Exam:   Constitutional: NAD, calm, comfortable Vitals:   05/14/16 0045 05/14/16 0128 05/14/16 0130 05/14/16 0209  BP: 126/75  95/64 (!) 129/59  Pulse: 100 98 98 100  Resp: 16 12 23    Temp:    98.1 F (36.7 C)  TempSrc:    Oral  SpO2: 93% 96% 94% 100%  Weight:    98.1 kg (216 lb 4.8 oz)  Height:    5\' 2"  (1.575 m)   Eyes: PERRL, lids and conjunctivae normal ENMT: Mucous membranes are moist. Posterior pharynx clear of any exudate or lesions.Normal dentition.  Neck: normal, supple, no masses, no thyromegaly Respiratory: clear to auscultation bilaterally, no wheezing, no crackles. Normal respiratory effort. No accessory muscle use.  Cardiovascular: Regular rate and rhythm, no murmurs / rubs / gallops. No extremity edema. 2+ pedal pulses. No carotid bruits.  Abdomen: no tenderness, no masses palpated. No hepatosplenomegaly. Bowel sounds positive.  Musculoskeletal: no clubbing / cyanosis. No joint deformity upper and lower extremities. Good ROM, no contractures. Normal muscle tone.  Skin: no rashes, lesions, ulcers. No induration Neurologic: CN 2-12 grossly intact. Sensation intact, DTR normal. Strength 5/5 in all 4.  Psychiatric: Normal judgment and insight. Alert and oriented x 4. Normal mood.    Labs on Admission: I have personally reviewed following labs and imaging studies  CBC:  Recent Labs Lab 05/13/16 2014  WBC 11.4*  NEUTROABS 6.2  HGB 15.4*  HCT 46.8*  MCV 62.8*  PLT [id number]   Basic Metabolic Panel:  Recent Labs Lab 05/13/16 2014  NA 133*  K 3.3*  CL 99*  CO2 23  GLUCOSE 180*  BUN 7    CREATININE 0.70  CALCIUM 9.4   GFR: Estimated Creatinine Clearance: 90 mL/min (by C-G formula based on SCr of 0.7 mg/dL). Liver Function Tests:  Recent Labs Lab 05/13/16 2014  AST 56*  ALT 48  ALKPHOS 101  BILITOT 0.7  PROT 7.0  ALBUMIN 4.0   No results for input(s): LIPASE, AMYLASE in the last 168 hours. No results for input(s): AMMONIA in the last 168 hours. Coagulation Profile: No results for input(s): INR, PROTIME in the last 168 hours. Cardiac Enzymes: No results for input(s): CKTOTAL, CKMB, CKMBINDEX, TROPONINI in the last 168 hours. BNP (last 3 results) No results for input(s): PROBNP in the last 8760 hours. HbA1C: No results for input(s): HGBA1C in the last 72 hours. CBG:  Recent Labs Lab 05/14/16 0105  GLUCAP 78   Lipid Profile: No results for input(s): CHOL, HDL, LDLCALC, TRIG, CHOLHDL, LDLDIRECT in the  last 72 hours. Thyroid Function Tests: No results for input(s): TSH, T4TOTAL, FREET4, T3FREE, THYROIDAB in the last 72 hours. Anemia Panel: No results for input(s): VITAMINB12, FOLATE, FERRITIN, TIBC, IRON, RETICCTPCT in the last 72 hours. Urine analysis:    Component Value Date/Time   COLORURINE YELLOW 08/03/2013 2352   APPEARANCEUR CLOUDY (A) 08/03/2013 2352   LABSPEC 1.029 08/03/2013 2352   PHURINE 6.0 08/03/2013 2352   GLUCOSEU >1000 (A) 08/03/2013 2352   HGBUR LARGE (A) 08/03/2013 2352   BILIRUBINUR NEGATIVE 08/03/2013 2352   KETONESUR 15 (A) 08/03/2013 2352   PROTEINUR 30 (A) 08/03/2013 2352   UROBILINOGEN 0.2 08/03/2013 2352   NITRITE POSITIVE (A) 08/03/2013 2352   LEUKOCYTESUR MODERATE (A) 08/03/2013 2352    Radiological Exams on Admission: Dg Chest 2 View  Result Date: 05/13/2016 CLINICAL DATA:  Subacute onset of generalized chest tightness, shortness of breath, fatigue, fever and cough. Initial encounter. EXAM: CHEST  2 VIEW COMPARISON:  CT of the chest performed 11/13/2007 FINDINGS: The lungs are well-aerated and clear. There is no  evidence of focal opacification, pleural effusion or pneumothorax. The heart is normal in size; the mediastinal contour is within normal limits. No acute osseous abnormalities are seen. Clips are noted within the right upper quadrant, reflecting prior cholecystectomy. IMPRESSION: No acute cardiopulmonary process seen. Electronically Signed   By: Garald Balding M.D.   On: 05/13/2016 20:55    EKG: Independently reviewed. Vent. rate 104 BPM PR interval 124 ms QRS duration 82 ms QT/QTc 372/489 ms P-R-T axes 80 60 68 Sinus tachycardia Otherwise normal ECG  Assessment/Plan Principal Problem:   Chest pain, rule out acute myocardial infarction Telemetry monitoring/observation. Trend troponin levels. Continue aspirin 81 mg by mouth daily. Check Myoview stress test in a.m. Consider cardiology consult.  Active Problems:   Hyperlipidemia Pravastatin 40 mg by mouth daily. Monitor LFTs and lipid panel periodically.    Hypothyroidism Continue levothyroxine 75 g by mouth daily. Monitor TSH periodically.    Essential hypertension Continue losartan 100 mg by mouth daily. Monitor blood pressure, BUN/creatinine and electrolytes periodically    DVT prophylaxis: Lovenox SQ. Code Status: Full code. Family Communication:  Disposition Plan: Admit for troponin level trending and stress testing. Consults called:  Admission status: observation/Telemetry.   Reubin Milan MD [facility] Pager [phone].  If 7PM-7AM, please contact night-coverage [email or web address] Password [email or web address]  05/14/2016, 5:40 AM

## 2016-05-14 NOTE — Plan of Care (Signed)
Problem: Safety: Goal: Ability to remain free from injury will improve Outcome: Progressing Pt placed as a fall risk d/t dizziness when standing and ambulating. Bed low and lock, alarm activated. Pt verbally agrees to call for assistance before attempting to exit bed.   Problem: Pain Managment: Goal: General experience of comfort will improve Outcome: Progressing Pt denies CP at this time. Pt reports chest heaviness and dizziness when ambulating or standing.

## 2016-05-15 ENCOUNTER — Other Ambulatory Visit: Payer: Self-pay | Admitting: Oncology

## 2016-05-15 ENCOUNTER — Observation Stay (HOSPITAL_COMMUNITY): Payer: 59

## 2016-05-15 DIAGNOSIS — G43419 Hemiplegic migraine, intractable, without status migrainosus: Secondary | ICD-10-CM

## 2016-05-15 DIAGNOSIS — R079 Chest pain, unspecified: Secondary | ICD-10-CM | POA: Diagnosis not present

## 2016-05-15 DIAGNOSIS — G43109 Migraine with aura, not intractable, without status migrainosus: Secondary | ICD-10-CM | POA: Diagnosis not present

## 2016-05-15 DIAGNOSIS — I10 Essential (primary) hypertension: Secondary | ICD-10-CM

## 2016-05-15 DIAGNOSIS — E039 Hypothyroidism, unspecified: Secondary | ICD-10-CM

## 2016-05-15 DIAGNOSIS — E785 Hyperlipidemia, unspecified: Secondary | ICD-10-CM

## 2016-05-15 LAB — CBC WITH DIFFERENTIAL/PLATELET
BASOS ABS: 0.1 10*3/uL (ref 0.0–0.1)
Basophils Relative: 1 %
EOS ABS: 0.6 10*3/uL (ref 0.0–0.7)
Eosinophils Relative: 6 %
HEMATOCRIT: 42.9 % (ref 36.0–46.0)
Hemoglobin: 13.6 g/dL (ref 12.0–15.0)
LYMPHS ABS: 4.4 10*3/uL — AB (ref 0.7–4.0)
Lymphocytes Relative: 48 %
MCH: 20.3 pg — AB (ref 26.0–34.0)
MCHC: 31.7 g/dL (ref 30.0–36.0)
MCV: 64 fL — ABNORMAL LOW (ref 78.0–100.0)
Monocytes Absolute: 1.2 10*3/uL — ABNORMAL HIGH (ref 0.1–1.0)
Monocytes Relative: 13 %
NEUTROS ABS: 3 10*3/uL (ref 1.7–7.7)
Neutrophils Relative %: 32 %
Platelets: 224 10*3/uL (ref 150–400)
RBC: 6.7 MIL/uL — ABNORMAL HIGH (ref 3.87–5.11)
RDW: 16.4 % — AB (ref 11.5–15.5)
WBC: 9.3 10*3/uL (ref 4.0–10.5)

## 2016-05-15 LAB — COMPREHENSIVE METABOLIC PANEL
ALBUMIN: 3.3 g/dL — AB (ref 3.5–5.0)
ALT: 34 U/L (ref 14–54)
ANION GAP: 7 (ref 5–15)
AST: 51 U/L — ABNORMAL HIGH (ref 15–41)
Alkaline Phosphatase: 71 U/L (ref 38–126)
BILIRUBIN TOTAL: 0.5 mg/dL (ref 0.3–1.2)
BUN: 5 mg/dL — ABNORMAL LOW (ref 6–20)
CO2: 20 mmol/L — ABNORMAL LOW (ref 22–32)
Calcium: 8.7 mg/dL — ABNORMAL LOW (ref 8.9–10.3)
Chloride: 111 mmol/L (ref 101–111)
Creatinine, Ser: 0.64 mg/dL (ref 0.44–1.00)
GFR calc non Af Amer: >60 mL/min (ref >60)
GLUCOSE: 133 mg/dL — AB (ref 65–99)
POTASSIUM: 5.3 mmol/L — AB (ref 3.5–5.1)
SODIUM: 138 mmol/L (ref 135–145)
TOTAL PROTEIN: 5.5 g/dL — AB (ref 6.5–8.1)

## 2016-05-15 LAB — GLUCOSE, CAPILLARY
GLUCOSE-CAPILLARY: 102 mg/dL — AB (ref 65–99)
GLUCOSE-CAPILLARY: 133 mg/dL — AB (ref 65–99)
GLUCOSE-CAPILLARY: 103 mg/dL — AB (ref 65–99)
Glucose-Capillary: 65 mg/dL (ref 65–99)
Glucose-Capillary: 67 mg/dL (ref 65–99)
Glucose-Capillary: 109 mg/dL — ABNORMAL HIGH (ref 65–99)

## 2016-05-15 LAB — MAGNESIUM: Magnesium: 1.9 mg/dL (ref 1.7–2.4)

## 2016-05-15 LAB — PHOSPHORUS: PHOSPHORUS: 3.9 mg/dL (ref 2.5–4.6)

## 2016-05-15 MED ORDER — IOPAMIDOL (ISOVUE-370) INJECTION 76%
INTRAVENOUS | Status: DC
Start: 2016-05-15 — End: 2016-05-15
  Filled 2016-05-15: qty 50

## 2016-05-15 MED ORDER — PROCHLORPERAZINE EDISYLATE 5 MG/ML IJ SOLN
10.0000 mg | Freq: Once | INTRAMUSCULAR | Status: AC
  Administered 2016-05-15: 10 mg via INTRAVENOUS
  Filled 2016-05-15: qty 2

## 2016-05-15 MED ORDER — PROCHLORPERAZINE EDISYLATE 5 MG/ML IJ SOLN
10.0000 mg | Freq: Four times a day (QID) | INTRAMUSCULAR | Status: DC | PRN
  Administered 2016-05-16: 10 mg via INTRAVENOUS
  Filled 2016-05-15 (×2): qty 2

## 2016-05-15 MED ORDER — KETOROLAC TROMETHAMINE 30 MG/ML IJ SOLN
30.0000 mg | Freq: Once | INTRAMUSCULAR | Status: AC
  Administered 2016-05-15: 30 mg via INTRAVENOUS
  Filled 2016-05-15: qty 1

## 2016-05-15 MED ORDER — DIPHENHYDRAMINE HCL 50 MG/ML IJ SOLN
12.5000 mg | Freq: Four times a day (QID) | INTRAMUSCULAR | Status: DC | PRN
  Administered 2016-05-16: 12.5 mg via INTRAVENOUS
  Filled 2016-05-15: qty 1

## 2016-05-15 MED ORDER — DIPHENHYDRAMINE HCL 50 MG/ML IJ SOLN
12.5000 mg | Freq: Once | INTRAMUSCULAR | Status: AC
Start: 2016-05-15 — End: 2016-05-15
  Administered 2016-05-15: 12.5 mg via INTRAVENOUS
  Filled 2016-05-15: qty 1

## 2016-05-15 MED ORDER — SODIUM CHLORIDE 0.9 % IV BOLUS (SEPSIS)
500.0000 mL | Freq: Once | INTRAVENOUS | Status: AC
  Administered 2016-05-15: 500 mL via INTRAVENOUS

## 2016-05-15 NOTE — Progress Notes (Signed)
PROGRESS NOTE    Morgan [name]  [id number] DOB: [birth year] DOA: 05/13/2016 PCP: Bradly Bienenstock., MD   Brief Narrative:  Morgan [name] is a 50 y.o. female with medical history significant of Type 2 diabetes, fibroids, hyperlipidemia, hypertension, hypothyroidism, history of occlusion of left brachial artery, thalassemia minor, vitamin D deficiency who is coming to the emergency department due to chest pressure. She was worked up and admitted and evaluated by Cardiology and underwent Lexiscan stress testing with was Normal. She was going to be D/C'd yesterday but complained of burning on urination and had a low Potassium so she was kept for work up for UTI. This AM her labs showed no UTI, and Potassium was actually Hyperkalemic. Patient was getting ready to be D/C'd and got in the shower however became dizzy and lightheaded almost passing out. She had complained of a Headache earlier. Upon examination she had some dysarthria and Left arm drop so a Rapid Response and Code Stroke was Called because she became somewhat encephalopathic. She was taken for immediate Head CT and evaluated by Neurology Dr. Leonel Ramsay. She also underwent MRI testing which was negative for a stroke. She was treated for a Complex Migraine with Neurological symptoms and given Compazine and Phenegran. She was then resting at the time of re-evaluation. Depending on how she does in the AM she can likely be D/C'd Home.   Assessment & Plan:   Principal Problem:   Chest pain, rule out acute myocardial infarction Active Problems:   Hyperlipidemia   Hypothyroidism   Essential hypertension  Acute Complex Migraine with Neurological Symptoms of Weakness, Photophobia, Headache and Dysarthria -Code Stroke Head CT was called and showed Negative CT of the head and showed Chronic right maxillary sinus disease -Blood Sugar at time of Rapid was 103. Patient Bolused 500 mL of NS -MRI and MRA of Head showed No acute finding,  including infarct. Unremarkable appearance of the orbits and optic chiasm. 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 No acute arterial finding. -Patient was given Compazine, Benadryl and Toradol by Neurologist -[facility] and will Continue to follow in Am.   Chest pain ruled out Acute myocardial infarction -Cardiac Troponins Negative x 3 -Lexiscan Stress Test Done and showed No reversible ischemia or infarction. Normal left ventricular wall motion. Left ventricular ejection fraction 75%. Non invasive risk stratification*: Low -Continue aspirin 81 mg by mouth daily. -Appreciated Cardiology Consult and Recc's. Cards Signed off.   Hyperlipidemia -Will obtain Lipid Panel -Pravastatin 40 mg by mouth daily.  Hypothyroidism -Continue levothyroxine 75 g by mouth daily.  Essential hypertension -Hold Losartan 100 mg by mouth daily.  Hyperkalemia -Hold Losartan in AM and Canagliflozin -Likely 2/2 to Kcl repletion and Losartan  Diabetes Mellitus Type 2 -C/w Novolog Mix, Liratlutid 1.2 mg sq; Held Canagliflozin because of Hyperkalemia  Atypical Lymphocytes -Discussed Case with Dr. Marjie Skiff in Heme/Onc and stated that patient can have outpatient evaluation with Dr. Irene Limbo in Heme/Onc.   Urinary Discomfort and Burning -UA Negative for UTI -Blood Cultures Negative -Possibly from Canagliflozin 100 mg. Now D/C'd  Hx of Thalassemia -Can follow outpatient with Dr. Irene Limbo in Heme/Onc.  DVT prophylaxis: Lovenox Code Status: Full Family Communication: Discussed case with patient's daughter at bedside Disposition Plan: Home in AM  Consultants:   Neurology Dr. Leonel Ramsay  Cardiology [facility]  Heme/Onc Dr. Marjie Skiff via Phone Consultation.    Procedures: CT HEAD w/o Contrast, MRI/MRA of  Brain, Lexiscan Stress  Antimicrobials: None  Subjective: Patient was doing well  this AM and had no complaints earlier and was about to be D/C'd in the afternoon but Rapid Response happened because she got dizzy and almost passed out in [employer]. She was evaluated and a Code Stroke was called because of her concerning symptoms and dysarthria. Work Up was negative for CVA. She was back in bed resting and asleep after.   Objective: Vitals:   05/14/16 2100 05/15/16 0500 05/15/16 1318 05/15/16 1319  BP: 111/89 (!) 102/58 109/68 120/68  Pulse: 85 84    Resp: 20 18    Temp: 98.7 F (37.1 C) 97.8 F (36.6 C)    TempSrc:      SpO2: 99% 95%    Weight:  99.8 kg (220 lb)    Height:        Intake/Output Summary (Last 24 hours) at 05/15/16 1827 Last data filed at 05/15/16 1420  Gross per 24 hour  Intake              620 ml  Output              200 ml  Net              420 ml   Filed Weights   05/14/16 0209 05/15/16 0500  Weight: 98.1 kg (216 lb 4.8 oz) 99.8 kg (220 lb)    Examination: Physical Exam at Rapid:  Constitutional: WN/WD, moderately distressed Eyes: PERRLA, lids and conjunctivae normal, sclerae anicteric  ENMT: External Ears, Nose appear normal. Grossly normal hearing. Mucous membranes are moist. Normal dentition.  Neck: Appears normal, supple, no cervical masses, normal ROM, no appreciable thyromegaly, no JVD.  Respiratory: Clear to auscultation bilaterally, no wheezing, rales, rhonchi or crackles. Normal respiratory effort and patient is not tachypenic. No accessory muscle use.  Cardiovascular: RRR, no murmurs / rubs / gallops. S1 and S2 auscultated. No extremity edema. 2+ pedal pulses. No carotid bruits.  Abdomen: Soft, non-tender, non-distended. No masses palpated. No appreciable hepatosplenomegaly. Bowel sounds positive.  GU: Deferred. Musculoskeletal: No clubbing / cyanosis of digits/nails. No joint deformity upper and lower extremities.. Decreased strength on left compared to Right. .  Skin: No rashes, lesions, ulcers. No induration; Warm and dry.    Neurologic: Dysarthric with a field cut. Sensation diminished on Left. Appeared confused. Romberg sign cerebellar reflexes not assessed.  Psychiatric: Anxious and Tearful.  Data Reviewed: I have personally reviewed following labs and imaging studies  CBC:  Recent Labs Lab 05/13/16 2014 05/14/16 0634 05/14/16 1409 05/15/16 0859  WBC 11.4* 12.9* 9.1 9.3  NEUTROABS 6.2  --   --  3.0  HGB 15.4* 13.3 12.6 13.6  HCT 46.8* 42.1 39.4 42.9  MCV 62.8* 63.6* 63.2* 64.0*  PLT 323 267 238 [id number]   Basic Metabolic Panel:  Recent Labs Lab 05/13/16 2014 05/14/16 0634 05/14/16 1409 05/15/16 0859  NA 133*  --  137 138  K 3.3*  --  3.0* 5.3*  CL 99*  --  109 111  CO2 23  --  20* 20*  GLUCOSE 180*  --  234* 133*  BUN 7  --  6 5*  CREATININE 0.70 0.66 0.65 0.64  CALCIUM 9.4  --  8.4* 8.7*  MG  --   --   --  1.9  PHOS  --   --   --  3.9   GFR: Estimated Creatinine Clearance: 90.9 mL/min (by C-G formula based on SCr of 0.64  mg/dL). Liver Function Tests:  Recent Labs Lab 05/13/16 2014 05/14/16 1409 05/15/16 0859  AST 56* 40 51*  ALT 48 37 34  ALKPHOS 101 74 71  BILITOT 0.7 0.7 0.5  PROT 7.0 5.0* 5.5*  ALBUMIN 4.0 3.1* 3.3*   No results for input(s): LIPASE, AMYLASE in the last 168 hours. No results for input(s): AMMONIA in the last 168 hours. Coagulation Profile: No results for input(s): INR, PROTIME in the last 168 hours. Cardiac Enzymes:  Recent Labs Lab 05/14/16 0634 05/14/16 1409  TROPONINI <0.03 <0.03   BNP (last 3 results) No results for input(s): PROBNP in the last 8760 hours. HbA1C: No results for input(s): HGBA1C in the last 72 hours. CBG:  Recent Labs Lab 05/15/16 0803 05/15/16 1131 05/15/16 1310 05/15/16 1455 05/15/16 1632  GLUCAP 102* 133* 103* 65 67   Lipid Profile: No results for input(s): CHOL, HDL, LDLCALC, TRIG, CHOLHDL, LDLDIRECT in the last 72 hours. Thyroid Function Tests: No results for input(s): TSH, T4TOTAL, FREET4, T3FREE, THYROIDAB  in the last 72 hours. Anemia Panel: No results for input(s): VITAMINB12, FOLATE, FERRITIN, TIBC, IRON, RETICCTPCT in the last 72 hours. Sepsis Labs: No results for input(s): PROCALCITON, LATICACIDVEN in the last 168 hours.  Recent Results (from the past 240 hour(s))  Culture, blood (Routine X 2) w Reflex to ID Panel     Status: None (Preliminary result)   Collection Time: 05/14/16  7:55 PM  Result Value Ref Range Status   Specimen Description BLOOD RIGHT ANTECUBITAL  Final   Special Requests BOTTLES DRAWN AEROBIC ONLY 5CC  Final   Culture NO GROWTH < 24 HOURS  Final   Report Status PENDING  Incomplete  Culture, blood (Routine X 2) w Reflex to ID Panel     Status: None (Preliminary result)   Collection Time: 05/14/16  7:57 PM  Result Value Ref Range Status   Specimen Description BLOOD LEFT ANTECUBITAL  Final   Special Requests BOTTLES DRAWN AEROBIC ONLY 5CC  Final   Culture NO GROWTH < 24 HOURS  Final   Report Status PENDING  Incomplete    Radiology Studies: Dg Chest 2 View  Result Date: 05/13/2016 CLINICAL DATA:  Subacute onset of generalized chest tightness, shortness of breath, fatigue, fever and cough. Initial encounter. EXAM: CHEST  2 VIEW COMPARISON:  CT of the chest performed 11/13/2007 FINDINGS: The lungs are well-aerated and clear. There is no evidence of focal opacification, pleural effusion or pneumothorax. The heart is normal in size; the mediastinal contour is within normal limits. No acute osseous abnormalities are seen. Clips are noted within the right upper quadrant, reflecting prior cholecystectomy. IMPRESSION: No acute cardiopulmonary process seen. Electronically Signed   By: Garald Balding M.D.   On: 05/13/2016 20:55   Mr Jodene Nam Head Wo Contrast  Result Date: 05/15/2016 CLINICAL DATA:  Loss of peripheral visual field on the right. EXAM: MRI HEAD WITHOUT CONTRAST MRA HEAD WITHOUT CONTRAST TECHNIQUE: Multiplanar, multiecho pulse sequences of the brain and surrounding  structures were obtained without intravenous contrast. Angiographic images of the head were obtained using MRA technique without contrast. COMPARISON:  None. FINDINGS: MRI HEAD FINDINGS Brain: No acute infarction, hemorrhage, hydrocephalus, extra-axial collection or mass lesion. Remote small vessel infarct in the peripheral left cerebellum. Few foci of cerebral white matter FLAIR signal abnormality are presumably chronic microvascular ischemic change given vascular risk factors and the cerebellar finding. Normal appearance of the pituitary and chiasm. Vascular: Dural venous sinuses are negative. Arterial findings below. Skull and upper cervical  spine: Hypo intense appearance of the calvarium, question if this is related patient's thalassemia minor history. T2 hyperintensity in the right petrous apex shows no expansion or bony thinning on head CT. This area does not restrict diffusion. Favor trapped fluid or arrested aeration. Sinuses/Orbits: Mucosal thickening versus retention cysts in the inferior right maxillary sinus. Unremarkable appearance of the orbits. Other: Motion degraded exam. Axial T1 weighted imaging was not obtained. MRA HEAD FINDINGS Symmetric carotid arteries and branching. Standard vertebrobasilar branching. Small if any posterior communicating arteries. Broad anterior communicating artery. Imaging is limited by motion artifact. No major branch occlusion is noted. There is moderate right cavernous and paraclinoid ICA narrowing. Atherosclerotic carotid siphon calcifications by CT. Negative for aneurysm. Mildly bulbous appearance of the right para clinoid ICA is plates atherosclerotic changes based on 3D reformats. IMPRESSION: 1. No acute finding, including infarct. Unremarkable appearance of the orbits and optic chiasm. 2. 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 3. 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 No acute arterial finding. Electronically Signed    By: Monte Fantasia M.D.   On: 05/15/2016 15:07   Mr Brain Wo Contrast  Result Date: 05/15/2016 CLINICAL DATA:  Loss of peripheral visual field on the right. EXAM: MRI HEAD WITHOUT CONTRAST MRA HEAD WITHOUT CONTRAST TECHNIQUE: Multiplanar, multiecho pulse sequences of the brain and surrounding structures were obtained without intravenous contrast. Angiographic images of the head were obtained using MRA technique without contrast. COMPARISON:  None. FINDINGS: MRI HEAD FINDINGS Brain: No acute infarction, hemorrhage, hydrocephalus, extra-axial collection or mass lesion. Remote small vessel infarct in the peripheral left cerebellum. Few foci of cerebral white matter FLAIR signal abnormality are presumably chronic microvascular ischemic change given vascular risk factors and the cerebellar finding. Normal appearance of the pituitary and chiasm. Vascular: Dural venous sinuses are negative. Arterial findings below. Skull and upper cervical spine: Hypo intense appearance of the calvarium, question if this is related patient's thalassemia minor history. T2 hyperintensity in the right petrous apex shows no expansion or bony thinning on head CT. This area does not restrict diffusion. Favor trapped fluid or arrested aeration. Sinuses/Orbits: Mucosal thickening versus retention cysts in the inferior right maxillary sinus. Unremarkable appearance of the orbits. Other: Motion degraded exam. Axial T1 weighted imaging was not obtained. MRA HEAD FINDINGS Symmetric carotid arteries and branching. Standard vertebrobasilar branching. Small if any posterior communicating arteries. Broad anterior communicating artery. Imaging is limited by motion artifact. No major branch occlusion is noted. There is moderate right cavernous and paraclinoid ICA narrowing. Atherosclerotic carotid siphon calcifications by CT. Negative for aneurysm. Mildly bulbous appearance of the right para clinoid ICA is plates atherosclerotic changes based on 3D  reformats. IMPRESSION: 1. No acute finding, including infarct. Unremarkable appearance of the orbits and optic chiasm. 2. 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 3. 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 No acute arterial finding. Electronically Signed   By: Monte Fantasia M.D.   On: 05/15/2016 15:07   Nm Myocar Multi W/spect W/wall Motion / Ef  Result Date: 05/14/2016 CLINICAL DATA:  Chest pain for 2 weeks.  Diabetes and hypertension. EXAM: MYOCARDIAL IMAGING WITH SPECT (REST AND PHARMACOLOGIC-STRESS) GATED LEFT VENTRICULAR WALL MOTION STUDY LEFT VENTRICULAR EJECTION FRACTION TECHNIQUE: Standard myocardial SPECT imaging was performed after resting intravenous injection of 10 mCi Tc-55m tetrofosmin. Subsequently, intravenous infusion of Lexiscan was performed under the supervision of the Cardiology staff. At peak effect of the drug, 30 mCi Tc-5m tetrofosmin was injected  intravenously and standard myocardial SPECT imaging was performed. Quantitative gated imaging was also performed to evaluate left ventricular wall motion, and estimate left ventricular ejection fraction. COMPARISON:  None. FINDINGS: Perfusion: No decreased activity in the left ventricle on stress imaging to suggest reversible ischemia or infarction. Wall Motion: Normal left ventricular wall motion. No left ventricular dilation. Left Ventricular Ejection Fraction: 75 % End diastolic volume 52 ml End systolic volume 13 ml IMPRESSION: 1. No reversible ischemia or infarction. 2. Normal left ventricular wall motion. 3. Left ventricular ejection fraction 75% 4. Non invasive risk stratification*: Low *2012 Appropriate Use Criteria for Coronary Revascularization Focused Update: J Am Coll Cardiol. [id number]. [email or web address] Electronically Signed   By: Earle Gell M.D.   On: 05/14/2016 14:12   Ct Head Code Stroke Wo Contrast`  Result Date:  05/15/2016 CLINICAL DATA:  Code stroke. Aphasia. Unable follow directions, recall name, or birthday. Altered mental status. EXAM: CT HEAD WITHOUT CONTRAST TECHNIQUE: Contiguous axial images were obtained from the base of the skull through the vertex without intravenous contrast. COMPARISON:  CT head without contrast 08/11/2006. FINDINGS: Brain: No acute infarct, hemorrhage, or mass lesion is present. The ventricles are of normal size. No significant extraaxial fluid collection is present. No significant white matter disease is present. Vascular: No hyperdense vessel or unexpected calcification. Skull: The calvarium is intact. Sinuses/Orbits: Prominent mucosal thickening opacifies over 50% of the right maxillary sinus. There is wall thickening suggesting chronic disease. The left maxillary sinus is clear. The paranasal sinuses and mastoid air cells are clear. ASPECTS [facility] Stroke Program Early CT Score) - Ganglionic level infarction (caudate, lentiform nuclei, internal capsule, insula, M1-M3 cortex): 7/7 - Supraganglionic infarction (M4-M6 cortex): 3/3 Total score (0-10 with 10 being normal): 10/10 IMPRESSION: 1. Negative CT of the head 2. Chronic right maxillary sinus disease These results were called by telephone at the time of interpretation on 05/15/2016 at 2:01 pm to Dr. Leonel Ramsay, who verbally acknowledged these results. 3. ASPECTS is Electronically Signed   By: San Morelle M.D.   On: 05/15/2016 14:01   Scheduled Meds: . aspirin EC  81 mg Oral Daily  . canagliflozin  100 mg Oral QAC breakfast  . cholecalciferol  2,000 Units Oral Daily  . enoxaparin (LOVENOX) injection  40 mg Subcutaneous Q24H  . FLUoxetine  40 mg Oral Daily  . Influenza vac split quadrivalent PF  0.5 mL Intramuscular Tomorrow-1000  . insulin aspart protamine- aspart  100 Units Subcutaneous BID WC  . levothyroxine  75 mcg Oral QAC breakfast  . liraglutide  1.2 mg Subcutaneous BH-q7a  . losartan  100 mg Oral Daily  .  pantoprazole  40 mg Oral Daily  . pravastatin  40 mg Oral Daily  . sodium chloride flush  3 mL Intravenous Q12H  . topiramate  50 mg Oral BID   Continuous Infusions:    LOS: 0 days   Kerney Elbe, DO [facility] Pager [phone]  If 7PM-7AM, please contact night-coverage [email or web address] Password [facility] 05/15/2016, 6:27 PM

## 2016-05-15 NOTE — Consult Note (Signed)
Neurology Consultation Reason for Consult: Right-sided numbness Referring Physician: Alfredia Ferguson, Jenetta Downer  CC: Right-sided numbness  History is obtained from: Patient  HPI: Morgan [name] is a 50 y.o. female was in her normal state of health until approximately 1 PM. She felt generalized weakness, and was slow to respond. She complains of left-sided severe headache. She endorses photophobia. She has tingling on the right side as well as numbness. She also notices some change in her vision. States that she does get spots in her vision migraines, but does not typically have these symptoms.  Her daughter states that she has had a similar episode once before they were told that it was "not a stroke" but was not sure what it was.   LKW: 1 PM tpa given?: no, not a stroke, received Lovenox   ROS: A 14 point ROS was performed and is negative except as noted in the HPI.   Past Medical History:  Diagnosis Date  . Diabetes mellitus without complication (HCC)    Type 2  . Fibroid   . Hyperlipidemia   . Hypertension   . Hypothyroid   . 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 Thalassemia minor   . Vitamin D deficiency      Family History  Problem Relation Age of Onset  . Diabetes Mother   . Hypertension Mother   . Hyperlipidemia Mother   . CAD Mother   . CAD Father   . Heart failure Father   . Hypertension Sister   . Hyperlipidemia Sister   . Hypertension Brother   . Hyperlipidemia Brother      Social History:  reports that she quit smoking about 2 years ago. Her smoking use included Cigarettes. She has never used smokeless tobacco. She reports that she does not drink alcohol or use drugs.   Exam: Current vital signs: BP 120/68   Pulse 84   Temp 97.8 F (36.6 C)   Resp 18   Ht 5\' 2"  (1.575 m)   Wt 99.8 kg (220 lb)   LMP 08/03/2013   SpO2 95%   BMI 40.24 kg/m  Vital signs in last 24 hours: Temp:  [97.8 F (36.6 C)-98.7 F (37.1 C)] 97.8 F (36.6 C) (10/14 0500) Pulse Rate:   [84-85] 84 (10/14 0500) Resp:  [18-20] 18 (10/14 0500) BP: (102-120)/(58-89) 120/68 (10/14 1319) SpO2:  [95 %-99 %] 95 % (10/14 0500) Weight:  [99.8 kg (220 lb)] 99.8 kg (220 lb) (10/14 0500)   Physical Exam  Constitutional: Appears well-developed and well-nourished.  Psych: Affect appropriate to situation Eyes: No scleral injection HENT: No OP obstrucion Head: Normocephalic.  Cardiovascular: Normal rate and regular rhythm.  Respiratory: Effort normal and breath sounds normal to anterior ascultation GI: Soft.  No distension. There is no tenderness.  Skin: WDI  Neuro: Mental Status: Patient is awake, alert, oriented to person, place, month, year, and situation. Patient is able to give a clear and coherent history. No signs of neglect. She does have mild latency of speech and difficulty with repetition. Cranial Nerves: II: She is able to detect movement in all visual fields, but has difficulty with counting fingers in the right lower field. Pupils are equal, round, and reactive to light.   III,IV, VI: EOMI without ptosis or diploplia.  V: Facial sensation is decreased on the right VII: Facial movement is symmetric.  VIII: hearing is intact to voice X: Uvula elevates symmetrically XI: Shoulder shrug is symmetric. XII: tongue is midline without atrophy or fasciculations.  Motor: Tone is normal. Bulk is normal. 5/5 strength was present on the left side, she has mild weakness of the right leg. 4+/5 Sensory: Sensation is decreased in the right leg Cerebellar: FNF  are intact bilaterally    I have reviewed labs in epic and the results pertinent to this consultation are: Mild hypokalemia  I have reviewed the images obtained: CT head-negative  Impression: 50 year old female with sudden onset right leg weakness, difficult speaking, blurred vision on the right. In the setting of a severe photophobic headache, competent migraine may need to be considered. On my initial evaluation, I  recommended an MRI of the brain which was accomplished prior to my finalizing this note and does not show any evidence of large vessel occlusion or infarction.  Recommendations: 1) Compazine, Benadryl, Toradol 2) could use Compazine, Benadryl every 6 hours when necessary for headache 3) neurology will continue to follow.   Roland Rack, MD [facility] [phone]  If 7pm- 7am, please page neurology on call as listed in [place].

## 2016-05-15 NOTE — Significant Event (Signed)
Rapid Response Event Note  Overview:  Called by the RN for assistance.  Time Called: [id number] Arrival Time: [id number] Event Type: Neurologic  Initial Focused Assessment:  Called by RN for assistance.  On my arrival to patients Rn, family and MD at bedside.  Patient was in shower when she suddenly became confused, and generalized weak.  Patient is very tearful, states has tingling of right side. Code stroke called by MD.  Dr Leonel Ramsay arrived to bedside prior to arriving to leaving floor for CT scan.  Patient brought to MRI @ 1350.  Patient is slightly aphasic, right field cut, and right sensation deficits.  NIHSS 5.   Interventions:  Code stroke called, to CT scan, To MRI, transported vai bed with monitor.    Plan of Care (if not transferred):  RN to monitor   Event Summary:  Patient to remain on 3W, RN to monitor and call if assistance needed   at      at          [facility], Harlin Rain

## 2016-05-15 NOTE — Progress Notes (Signed)
Called out to Nurses station from [facility] c/o weakness. Pt. With generalized weakness and lethargy assisted to chair in room with Max. Assist x 3. Pt confused and slow to respond. Assisted back to bed. CBG = 103. VSS. Dr. Chana Bode into room. IVF bolus 500 ml started. Rapid Response called.

## 2016-05-15 NOTE — Progress Notes (Signed)
OOB to shower. No c/o expressed. Daughter into room.

## 2016-05-16 DIAGNOSIS — G43419 Hemiplegic migraine, intractable, without status migrainosus: Secondary | ICD-10-CM | POA: Diagnosis not present

## 2016-05-16 DIAGNOSIS — R079 Chest pain, unspecified: Secondary | ICD-10-CM | POA: Diagnosis not present

## 2016-05-16 DIAGNOSIS — G43109 Migraine with aura, not intractable, without status migrainosus: Secondary | ICD-10-CM | POA: Diagnosis not present

## 2016-05-16 DIAGNOSIS — E785 Hyperlipidemia, unspecified: Secondary | ICD-10-CM | POA: Diagnosis not present

## 2016-05-16 DIAGNOSIS — E039 Hypothyroidism, unspecified: Secondary | ICD-10-CM | POA: Diagnosis not present

## 2016-05-16 LAB — CBC WITH DIFFERENTIAL/PLATELET
BASOS ABS: 0.1 10*3/uL (ref 0.0–0.1)
Basophils Relative: 1 %
EOS ABS: 0.5 10*3/uL (ref 0.0–0.7)
Eosinophils Relative: 6 %
HEMATOCRIT: 37.7 % (ref 36.0–46.0)
Hemoglobin: 11.8 g/dL — ABNORMAL LOW (ref 12.0–15.0)
LYMPHS ABS: 3.6 10*3/uL (ref 0.7–4.0)
Lymphocytes Relative: 44 %
MCH: 20 pg — ABNORMAL LOW (ref 26.0–34.0)
MCHC: 31.3 g/dL (ref 30.0–36.0)
MCV: 63.8 fL — ABNORMAL LOW (ref 78.0–100.0)
MONO ABS: 0.9 10*3/uL (ref 0.1–1.0)
MONOS PCT: 11 %
Neutro Abs: 3.1 10*3/uL (ref 1.7–7.7)
Neutrophils Relative %: 38 %
PLATELETS: 235 10*3/uL (ref 150–400)
RBC: 5.91 MIL/uL — AB (ref 3.87–5.11)
RDW: 15.6 % — AB (ref 11.5–15.5)
WBC: 8.2 10*3/uL (ref 4.0–10.5)

## 2016-05-16 LAB — COMPREHENSIVE METABOLIC PANEL
ALBUMIN: 2.9 g/dL — AB (ref 3.5–5.0)
ALT: 31 U/L (ref 14–54)
AST: 31 U/L (ref 15–41)
Alkaline Phosphatase: 60 U/L (ref 38–126)
Anion gap: 7 (ref 5–15)
BUN: 8 mg/dL (ref 6–20)
CHLORIDE: 109 mmol/L (ref 101–111)
CO2: 22 mmol/L (ref 22–32)
Calcium: 8.8 mg/dL — ABNORMAL LOW (ref 8.9–10.3)
Creatinine, Ser: 0.65 mg/dL (ref 0.44–1.00)
GFR calc Af Amer: >60 mL/min (ref >60)
GFR calc non Af Amer: >60 mL/min (ref >60)
GLUCOSE: 135 mg/dL — AB (ref 65–99)
POTASSIUM: 3.7 mmol/L (ref 3.5–5.1)
SODIUM: 138 mmol/L (ref 135–145)
Total Bilirubin: 0.6 mg/dL (ref 0.3–1.2)
Total Protein: 5.1 g/dL — ABNORMAL LOW (ref 6.5–8.1)

## 2016-05-16 LAB — URINE CULTURE

## 2016-05-16 LAB — GLUCOSE, CAPILLARY
GLUCOSE-CAPILLARY: 134 mg/dL — AB (ref 65–99)
GLUCOSE-CAPILLARY: 135 mg/dL — AB (ref 65–99)

## 2016-05-16 LAB — MAGNESIUM: Magnesium: 1.9 mg/dL (ref 1.7–2.4)

## 2016-05-16 LAB — PHOSPHORUS: Phosphorus: 3.8 mg/dL (ref 2.5–4.6)

## 2016-05-16 LAB — HEMOGLOBIN A1C
Hgb A1c MFr Bld: 13.2 % — ABNORMAL HIGH (ref 4.8–5.6)
Mean Plasma Glucose: 332 mg/dL

## 2016-05-16 MED ORDER — KETOROLAC TROMETHAMINE 30 MG/ML IJ SOLN
30.0000 mg | Freq: Once | INTRAMUSCULAR | Status: AC
  Administered 2016-05-16: 30 mg via INTRAVENOUS
  Filled 2016-05-16: qty 1

## 2016-05-16 MED ORDER — PNEUMOCOCCAL VAC POLYVALENT 25 MCG/0.5ML IJ INJ
0.5000 mL | INJECTION | INTRAMUSCULAR | Status: AC
  Administered 2016-05-16: 0.5 mL via INTRAMUSCULAR
  Filled 2016-05-16: qty 0.5

## 2016-05-16 NOTE — Discharge Summary (Signed)
Physician Discharge Summary  ORRA COMI [id number] DOB: [birth year] DOA: 05/13/2016  PCP: Bradly Bienenstock., MD  Admit date: 05/13/2016 Discharge date: 05/16/2016  Admitted From: Home Disposition:  Home  Recommendations for Outpatient Follow-up:  1. Follow up with PCP in 1-2 weeks 2. Follow up with Dr. 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 Discuss with Dietician Diabetic Education 5. Please obtain BMP/CBC in one week   Home Health: No Equipment/Devices: None  Discharge Condition: Stable and Improved CODE STATUS: FULL Diet recommendation: Heart Healthy / Carb Modified   Brief/Interim Summary: Morgan [name] a 50 y.o.femalewith medical history significant of Type 2 diabetes, fibroids, hyperlipidemia, hypertension, hypothyroidism, history of occlusion of left brachial artery, thalassemia minor, vitamin D deficiency who is coming to the emergency department due to chest pressure. She was worked up and admitted and evaluated by Cardiology and underwent Lexiscan stress testing with was Normal. She was going to be D/C'd yesterday but complained of burning on urination and had a low Potassium so she was kept for work up for UTI. This AM her labs showed no UTI, and Potassium was actually Hyperkalemic. Patient was getting ready to be D/C'd and got in the shower however became dizzy and lightheaded almost passing out. She had complained of a Headache earlier. Upon examination she had some dysarthria and Left arm drop so a Rapid Response and Code Stroke was Called because she became somewhat encephalopathic. She was taken for immediate Head CT and evaluated by Neurology Dr. Leonel Ramsay. She also underwent MRI testing which was negative for a stroke. She was treated for a Complex Migraine with Neurological symptoms and given Compazine and  Phenegran. She was then resting at the time of re-evaluation. She did well and had a headache this AM which improved. She was deemed stable to be D/C'd and will follow up with the Providers Mentioned Above. Cardiology stated she could follow up with them prn as she had a low risk stress.  Discharge Diagnoses:  Principal Problem:   Chest pain, rule out acute myocardial infarction Active Problems:   Hyperlipidemia   Hypothyroidism   Essential hypertension  Acute Complex Migraine with Neurological Symptoms of Weakness, Photophobia, Headache and Dysarthria -Code Stroke Head CT was called and showed Negative CT of the head and showed Chronic right maxillary sinus disease -Blood Sugar at time of Rapid was 103. Patient Bolused 500 mL of NS -MRI and MRA of Head showed No acute finding, including infarct. Unremarkable appearance of the orbits and optic chiasm. 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 No acute arterial finding. -Patient was given Compazine, Benadryl and Toradol by Neurologist -Appreciated Neurology Recc's. Patient had repeat Toradol injection -Follow up with [facility]rology [facility] as an outpatient.   Chest pain ruled out Acute myocardial infarction -Cardiac Troponins Negative x 3 -Lexiscan Stress Test Done and showed No reversible ischemia or infarction. Normal left ventricular wall motion. Left ventricular ejection fraction 75%. Non invasive risk stratification*: Low -Continue aspirin 81 mg by mouth daily. -Appreciated Cardiology Consult and Recc's. Cards Signed off. Follow up with Cards PRN.  Hyperlipidemia -Will obtain Lipid Panel as outpatient -Pravastatin 40 mg by mouth daily.  Hypothyroidism -Continue levothyroxine 75 g by mouth daily.  Essential hypertension -Continue Home Losartan 100 mg by mouth daily. -Follow up with PCP  Hyperkalemia -Likely  2/2 to Kcl repletion and  Losartan -Improved  Diabetes Mellitus Type 2 -C/w Novolog Mix, Liratlutid 1.2 mg sq; and Home Jardiance -Have follow up with A Diabetic Dietician.   Atypical Lymphocytes -Discussed Case with Dr. Marjie Skiff in Heme/Onc and stated that patient can have outpatient evaluation with Dr. Irene Limbo in Heme/Onc.   Urinary Discomfort and Burning -UA Negative for UTI -Blood Cultures Negative -Possibly from Canagliflozin 100 mg. Now D/C'd  Hx of Thalassemia -Can follow outpatient with Dr. Irene Limbo in Heme/Onc.  Discharge Instructions  Discharge Instructions    Ambulatory referral to Nutrition and Diabetic Education    Complete by:  As directed    Call MD for:  difficulty breathing, headache or visual disturbances    Complete by:  As directed    Call MD for:  persistant dizziness or light-headedness    Complete by:  As directed    Call MD for:  persistant nausea and vomiting    Complete by:  As directed    Diet - low sodium heart healthy    Complete by:  As directed    Discharge instructions    Complete by:  As directed    Follow up with PCP, Neurology, and Hematology/Oncology as an outpatient. Take all medications as prescribed. If symptoms worsen or change please come back to the ER for evaluation. Cardiology follow up as needed since Stress Test was Low Risk.   Increase activity slowly    Complete by:  As directed        Medication List    STOP taking these medications   naproxen sodium 220 MG tablet Commonly known as:  ANAPROX     TAKE these medications   aspirin 81 MG tablet Take 81 mg by mouth every morning.   cholecalciferol 1000 units tablet Commonly known as:  VITAMIN D Take 2,000 Units by mouth daily.   FLUoxetine 20 MG tablet Commonly known as:  PROZAC Take 40 mg by mouth every morning.   insulin lispro protamine-lispro (75-25) 100 UNIT/ML Susp injection Commonly known as:  HUMALOG 75/25 MIX Inject 100 Units into the skin 2 (two) times daily.   JARDIANCE 25 MG  Tabs tablet Generic drug:  empagliflozin Take 25 mg by mouth every morning.   levothyroxine 75 MCG tablet Commonly known as:  SYNTHROID, LEVOTHROID Take 75 mcg by mouth daily.   losartan 100 MG tablet Commonly known as:  COZAAR Take 100 mg by mouth daily.   multivitamin with minerals Tabs tablet Take 1 tablet by mouth daily.   pantoprazole 20 MG tablet Commonly known as:  PROTONIX Take 20 mg by mouth every morning.   pravastatin 40 MG tablet Commonly known as:  PRAVACHOL Take 40 mg by mouth daily.   topiramate 25 MG tablet Commonly known as:  TOPAMAX Take 50 mg by mouth 2 (two) times daily.   VICTOZA 18 MG/3ML Sopn Generic drug:  liraglutide Inject 1.2 mg into the skin every morning.      Follow-up Information    MILLER, DAVID Milus Banister., MD Follow up in 1 week(s).   Contact information: [address] [place] [address] [place], [place] Follow up in 1 week(s).   Specialties:  Hematology, Oncology Why:  Follow up for Atypical Lymphocytes and Thalassemia  Contact information: [place] [address] [phone]        [facility]. Call in 1 week(s).   Specialty:  Neurology Why:  Follow  up for Migraines and MRA findings with Stenosis Contact information: [place] [address] [phone]         Allergies  Allergen Reactions  . Ilosone [Erythromycin] Anaphylaxis  . Keflex [Cephalexin] Anaphylaxis  . Penicillins Anaphylaxis  . Iron Other (See Comments)    Pt reports condition limiting Iron intake.  . Metformin And Related Other (See Comments)    Pt prefers to not take this medication  . Morphine And Related Other (See Comments)    Pt reports hallucinations    Consultations:  Cardiology  Neurology   Hematology/Oncology via Telephone Consultation  Procedures/Studies: Dg Chest 2 View  Result Date: 05/13/2016 CLINICAL DATA:  Subacute  onset of generalized chest tightness, shortness of breath, fatigue, fever and cough. Initial encounter. EXAM: CHEST  2 VIEW COMPARISON:  CT of the chest performed 11/13/2007 FINDINGS: The lungs are well-aerated and clear. There is no evidence of focal opacification, pleural effusion or pneumothorax. The heart is normal in size; the mediastinal contour is within normal limits. No acute osseous abnormalities are seen. Clips are noted within the right upper quadrant, reflecting prior cholecystectomy. IMPRESSION: No acute cardiopulmonary process seen. Electronically Signed   By: Garald Balding M.D.   On: 05/13/2016 20:55   Mr Jodene Nam Head Wo Contrast  Result Date: 05/15/2016 CLINICAL DATA:  Loss of peripheral visual field on the right. EXAM: MRI HEAD WITHOUT CONTRAST MRA HEAD WITHOUT CONTRAST TECHNIQUE: Multiplanar, multiecho pulse sequences of the brain and surrounding structures were obtained without intravenous contrast. Angiographic images of the head were obtained using MRA technique without contrast. COMPARISON:  None. FINDINGS: MRI HEAD FINDINGS Brain: No acute infarction, hemorrhage, hydrocephalus, extra-axial collection or mass lesion. Remote small vessel infarct in the peripheral left cerebellum. Few foci of cerebral white matter FLAIR signal abnormality are presumably chronic microvascular ischemic change given vascular risk factors and the cerebellar finding. Normal appearance of the pituitary and chiasm. Vascular: Dural venous sinuses are negative. Arterial findings below. Skull and upper cervical spine: Hypo intense appearance of the calvarium, question if this is related patient's thalassemia minor history. T2 hyperintensity in the right petrous apex shows no expansion or bony thinning on head CT. This area does not restrict diffusion. Favor trapped fluid or arrested aeration. Sinuses/Orbits: Mucosal thickening versus retention cysts in the inferior right maxillary sinus. Unremarkable appearance of the  orbits. Other: Motion degraded exam. Axial T1 weighted imaging was not obtained. MRA HEAD FINDINGS Symmetric carotid arteries and branching. Standard vertebrobasilar branching. Small if any posterior communicating arteries. Broad anterior communicating artery. Imaging is limited by motion artifact. No major branch occlusion is noted. There is moderate right cavernous and paraclinoid ICA narrowing. Atherosclerotic carotid siphon calcifications by CT. Negative for aneurysm. Mildly bulbous appearance of the right para clinoid ICA is plates atherosclerotic changes based on 3D reformats. IMPRESSION: 1. No acute finding, including infarct. Unremarkable appearance of the orbits and optic chiasm. 2. 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 3. 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 No acute arterial finding. Electronically Signed   By: Monte Fantasia M.D.   On: 05/15/2016 15:07   Mr Brain Wo Contrast  Result Date: 05/15/2016 CLINICAL DATA:  Loss of peripheral visual field on the right. EXAM: MRI HEAD WITHOUT CONTRAST MRA HEAD WITHOUT CONTRAST TECHNIQUE: Multiplanar, multiecho pulse sequences of the brain and surrounding structures were obtained without intravenous contrast. Angiographic images of the head were obtained using MRA technique without contrast. COMPARISON:  None.  FINDINGS: MRI HEAD FINDINGS Brain: No acute infarction, hemorrhage, hydrocephalus, extra-axial collection or mass lesion. Remote small vessel infarct in the peripheral left cerebellum. Few foci of cerebral white matter FLAIR signal abnormality are presumably chronic microvascular ischemic change given vascular risk factors and the cerebellar finding. Normal appearance of the pituitary and chiasm. Vascular: Dural venous sinuses are negative. Arterial findings below. Skull and upper cervical spine: Hypo intense appearance of the calvarium, question if this is related patient's thalassemia minor history.  T2 hyperintensity in the right petrous apex shows no expansion or bony thinning on head CT. This area does not restrict diffusion. Favor trapped fluid or arrested aeration. Sinuses/Orbits: Mucosal thickening versus retention cysts in the inferior right maxillary sinus. Unremarkable appearance of the orbits. Other: Motion degraded exam. Axial T1 weighted imaging was not obtained. MRA HEAD FINDINGS Symmetric carotid arteries and branching. Standard vertebrobasilar branching. Small if any posterior communicating arteries. Broad anterior communicating artery. Imaging is limited by motion artifact. No major branch occlusion is noted. There is moderate right cavernous and paraclinoid ICA narrowing. Atherosclerotic carotid siphon calcifications by CT. Negative for aneurysm. Mildly bulbous appearance of the right para clinoid ICA is plates atherosclerotic changes based on 3D reformats. IMPRESSION: 1. No acute finding, including infarct. Unremarkable appearance of the orbits and optic chiasm. 2. 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 3. 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 No acute arterial finding. Electronically Signed   By: Monte Fantasia M.D.   On: 05/15/2016 15:07   Nm Myocar Multi W/spect W/wall Motion / Ef  Result Date: 05/14/2016 CLINICAL DATA:  Chest pain for 2 weeks.  Diabetes and hypertension. EXAM: MYOCARDIAL IMAGING WITH SPECT (REST AND PHARMACOLOGIC-STRESS) GATED LEFT VENTRICULAR WALL MOTION STUDY LEFT VENTRICULAR EJECTION FRACTION TECHNIQUE: Standard myocardial SPECT imaging was performed after resting intravenous injection of 10 mCi Tc-3m tetrofosmin. Subsequently, intravenous infusion of Lexiscan was performed under the supervision of the Cardiology staff. At peak effect of the drug, 30 mCi Tc-72m tetrofosmin was injected intravenously and standard myocardial SPECT imaging was performed. Quantitative gated imaging was also performed to evaluate left  ventricular wall motion, and estimate left ventricular ejection fraction. COMPARISON:  None. FINDINGS: Perfusion: No decreased activity in the left ventricle on stress imaging to suggest reversible ischemia or infarction. Wall Motion: Normal left ventricular wall motion. No left ventricular dilation. Left Ventricular Ejection Fraction: 75 % End diastolic volume 52 ml End systolic volume 13 ml IMPRESSION: 1. No reversible ischemia or infarction. 2. Normal left ventricular wall motion. 3. Left ventricular ejection fraction 75% 4. Non invasive risk stratification*: Low *2012 Appropriate Use Criteria for Coronary Revascularization Focused Update: J Am Coll Cardiol. [id number]. http://content.[email or web address]?[email or web address] Electronically Signed   By: Earle Gell M.D.   On: 05/14/2016 14:12   Ct Head Code Stroke Wo Contrast`  Result Date: 05/15/2016 CLINICAL DATA:  Code stroke. Aphasia. Unable follow directions, recall name, or birthday. Altered mental status. EXAM: CT HEAD WITHOUT CONTRAST TECHNIQUE: Contiguous axial images were obtained from the base of the skull through the vertex without intravenous contrast. COMPARISON:  CT head without contrast 08/11/2006. FINDINGS: Brain: No acute infarct, hemorrhage, or mass lesion is present. The ventricles are of normal size. No significant extraaxial fluid collection is present. No significant white matter disease is present. Vascular: No hyperdense vessel or unexpected calcification. Skull: The calvarium is intact. Sinuses/Orbits: Prominent mucosal thickening opacifies over 50% of the right maxillary sinus. There is wall thickening suggesting chronic disease. The left maxillary sinus is  clear. The paranasal sinuses and mastoid air cells are clear. ASPECTS [facility] Stroke Program Early CT Score) - Ganglionic level infarction (caudate, lentiform nuclei, internal capsule, insula, M1-M3 cortex): 7/7 - Supraganglionic infarction (M4-M6 cortex): 3/3 Total  score (0-10 with 10 being normal): 10/10 IMPRESSION: 1. Negative CT of the head 2. Chronic right maxillary sinus disease These results were called by telephone at the time of interpretation on 05/15/2016 at 2:01 pm to Dr. Leonel Ramsay, who verbally acknowledged these results. 3. ASPECTS is Electronically Signed   By: San Morelle M.D.   On: 05/15/2016 14:01     STRESS TEST Lexiscan Stress Test Done and showed No reversible ischemia or infarction. Normal left ventricular wall motion. Left ventricular ejection fraction 75%. Non invasive risk stratification*: Low  Subjective: Patient was doing extremely well this AM and stated her headache resolved. Denied any active complaints. Discussed follow up appointments with Patient and she verbally understood and agreed. Ready to be D/C'd Home.   Discharge Exam: Vitals:   05/16/16 0843 05/16/16 1243  BP: (!) 103/56 101/67  Pulse: 81 85  Resp:    Temp: 98.2 F (36.8 C) 98.1 F (36.7 C)   Vitals:   05/16/16 0019 05/16/16 0500 05/16/16 0843 05/16/16 1243  BP: 124/77 119/71 (!) 103/56 101/67  Pulse: 76 85 81 85  Resp: 15 18    Temp: 98.1 F (36.7 C) 98.3 F (36.8 C) 98.2 F (36.8 C) 98.1 F (36.7 C)  TempSrc:   Oral Oral  SpO2: 93% 100% 100% 96%  Weight:  100.3 kg (221 lb 3.2 oz)    Height:        General: Pt is alert, awake, not in acute distress Cardiovascular: RRR, S1/S2 +, no rubs, no gallops Respiratory: CTA bilaterally, no wheezing, no rhonchi Abdominal: Soft, NT, ND, bowel sounds + Extremities: no edema, no cyanosis  The results of significant diagnostics from this hospitalization (including imaging, microbiology, ancillary and laboratory) are listed below for reference.    Microbiology: Recent Results (from the past 240 hour(s))  Culture, blood (Routine X 2) w Reflex to ID Panel     Status: None (Preliminary result)   Collection Time: 05/14/16  7:55 PM  Result Value Ref Range Status   Specimen Description BLOOD RIGHT  ANTECUBITAL  Final   Special Requests BOTTLES DRAWN AEROBIC ONLY 5CC  Final   Culture NO GROWTH < 24 HOURS  Final   Report Status PENDING  Incomplete  Culture, blood (Routine X 2) w Reflex to ID Panel     Status: None (Preliminary result)   Collection Time: 05/14/16  7:57 PM  Result Value Ref Range Status   Specimen Description BLOOD LEFT ANTECUBITAL  Final   Special Requests BOTTLES DRAWN AEROBIC ONLY 5CC  Final   Culture NO GROWTH < 24 HOURS  Final   Report Status PENDING  Incomplete  Culture, Urine     Status: Abnormal   Collection Time: 05/14/16  9:06 PM  Result Value Ref Range Status   Specimen Description URINE, RANDOM  Final   Special Requests NONE  Final   Culture MULTIPLE SPECIES PRESENT, SUGGEST RECOLLECTION (A)  Final   Report Status 05/16/2016 FINAL  Final   Labs: BNP (last 3 results) No results for input(s): BNP in the last 8760 hours. Basic Metabolic Panel:  Recent Labs Lab 05/13/16 2014 05/14/16 0634 05/14/16 1409 05/15/16 0859 05/16/16 0439  NA 133*  --  137 138 138  K 3.3*  --  3.0* 5.3* 3.7  CL 99*  --  109 111 109  CO2 23  --  20* 20* 22  GLUCOSE 180*  --  234* 133* 135*  BUN 7  --  6 5* 8  CREATININE 0.70 0.66 0.65 0.64 0.65  CALCIUM 9.4  --  8.4* 8.7* 8.8*  MG  --   --   --  1.9 1.9  PHOS  --   --   --  3.9 3.8   Liver Function Tests:  Recent Labs Lab 05/13/16 2014 05/14/16 1409 05/15/16 0859 05/16/16 0439  AST 56* 40 51* 31  ALT 48 37 34 31  ALKPHOS 101 74 71 60  BILITOT 0.7 0.7 0.5 0.6  PROT 7.0 5.0* 5.5* 5.1*  ALBUMIN 4.0 3.1* 3.3* 2.9*   No results for input(s): LIPASE, AMYLASE in the last 168 hours. No results for input(s): AMMONIA in the last 168 hours. CBC:  Recent Labs Lab 05/13/16 2014 05/14/16 0634 05/14/16 1409 05/15/16 0859 05/16/16 0439  WBC 11.4* 12.9* 9.1 9.3 8.2  NEUTROABS 6.2  --   --  3.0 3.1  HGB 15.4* 13.3 12.6 13.6 11.8*  HCT 46.8* 42.1 39.4 42.9 37.7  MCV 62.8* 63.6* 63.2* 64.0* 63.8*  PLT 323 267 238  224 235   Cardiac Enzymes:  Recent Labs Lab 05/14/16 0634 05/14/16 1409  TROPONINI <0.03 <0.03   BNP: Invalid input(s): POCBNP CBG:  Recent Labs Lab 05/15/16 1455 05/15/16 1632 05/15/16 2132 05/16/16 0721 05/16/16 1139  GLUCAP 65 67 109* 134* 135*   D-Dimer No results for input(s): DDIMER in the last 72 hours. Hgb A1c No results for input(s): HGBA1C in the last 72 hours. Lipid Profile No results for input(s): CHOL, HDL, LDLCALC, TRIG, CHOLHDL, LDLDIRECT in the last 72 hours. Thyroid function studies No results for input(s): TSH, T4TOTAL, T3FREE, THYROIDAB in the last 72 hours.  Invalid input(s): FREET3 Anemia work up No results for input(s): VITAMINB12, FOLATE, FERRITIN, TIBC, IRON, RETICCTPCT in the last 72 hours. Urinalysis    Component Value Date/Time   COLORURINE YELLOW 05/14/2016 2105   APPEARANCEUR CLEAR 05/14/2016 2105   LABSPEC 1.035 (H) 05/14/2016 2105   PHURINE 6.0 05/14/2016 2105   GLUCOSEU >1000 (A) 05/14/2016 2105   HGBUR NEGATIVE 05/14/2016 2105   BILIRUBINUR NEGATIVE 05/14/2016 2105   KETONESUR NEGATIVE 05/14/2016 2105   PROTEINUR NEGATIVE 05/14/2016 2105   UROBILINOGEN 0.2 08/03/2013 2352   NITRITE NEGATIVE 05/14/2016 2105   LEUKOCYTESUR NEGATIVE 05/14/2016 2105   Sepsis Labs Invalid input(s): PROCALCITONIN,  WBC,  LACTICIDVEN Microbiology Recent Results (from the past 240 hour(s))  Culture, blood (Routine X 2) w Reflex to ID Panel     Status: None (Preliminary result)   Collection Time: 05/14/16  7:55 PM  Result Value Ref Range Status   Specimen Description BLOOD RIGHT ANTECUBITAL  Final   Special Requests BOTTLES DRAWN AEROBIC ONLY 5CC  Final   Culture NO GROWTH < 24 HOURS  Final   Report Status PENDING  Incomplete  Culture, blood (Routine X 2) w Reflex to ID Panel     Status: None (Preliminary result)   Collection Time: 05/14/16  7:57 PM  Result Value Ref Range Status   Specimen Description BLOOD LEFT ANTECUBITAL  Final   Special  Requests BOTTLES DRAWN AEROBIC ONLY 5CC  Final   Culture NO GROWTH < 24 HOURS  Final   Report Status PENDING  Incomplete  Culture, Urine     Status: Abnormal   Collection Time: 05/14/16  9:06 PM  Result Value  Ref Range Status   Specimen Description URINE, RANDOM  Final   Special Requests NONE  Final   Culture MULTIPLE SPECIES PRESENT, SUGGEST RECOLLECTION (A)  Final   Report Status 05/16/2016 FINAL  Final   Time coordinating discharge: Over 30 minutes  SIGNED: Kerney Elbe, DO [facility] 05/16/2016, 12:58 PM Pager [phone]  If 7PM-7AM, please contact night-coverage [email or web address] Password [email or web address]

## 2016-05-16 NOTE — Progress Notes (Signed)
Subjective: Much improved from yesterday. Still has headache 8/10.   Exam: Vitals:   05/16/16 0019 05/16/16 0500  BP: 124/77 119/71  Pulse: 76 85  Resp: 15 18  Temp: 98.1 F (36.7 C) 98.3 F (36.8 C)   Gen: In bed, NAD Resp: non-labored breathing, no acute distress Abd: soft, nt  Neuro: MS: awake, alert, interactive and appropriate [id number], She is able to count fingers in all fields.  Motor: MAEW with good strength, Sensory:symmetric to LT  Pertinent Labs: Cr 0.65  Impression: 50 yo F with likely complicated migraine. Neurolgocal symptoms much imprved, though still with headache.   Recommendations: 1) Repeat compazine/benadryl/toradol.  2) If still with headache following this, could use gabapentin 300mg  TID x 3 days.  3) No further recommendations beyond this at this time. Please call with further questions or concerns.   Roland Rack, MD [facility] [phone]  If 7pm- 7am, please page neurology on call as listed in [place].

## 2016-05-19 LAB — CULTURE, BLOOD (ROUTINE X 2)
Culture: NO GROWTH
Culture: NO GROWTH

## 2016-06-01 ENCOUNTER — Emergency Department (HOSPITAL_COMMUNITY)
Admission: EM | Admit: 2016-06-01 | Discharge: 2016-06-01 | Disposition: A | Discharge status: Home / Self Care | Payer: 59 | Attending: Emergency Medicine | Admitting: Emergency Medicine

## 2016-06-01 ENCOUNTER — Encounter (HOSPITAL_COMMUNITY): Payer: Self-pay

## 2016-06-01 DIAGNOSIS — Z7982 Long term (current) use of aspirin: Secondary | ICD-10-CM | POA: Diagnosis not present

## 2016-06-01 DIAGNOSIS — R51 Headache: Secondary | ICD-10-CM | POA: Diagnosis present

## 2016-06-01 DIAGNOSIS — G4489 Other headache syndrome: Secondary | ICD-10-CM | POA: Diagnosis not present

## 2016-06-01 DIAGNOSIS — Z794 Long term (current) use of insulin: Secondary | ICD-10-CM | POA: Insufficient documentation

## 2016-06-01 DIAGNOSIS — Z79899 Other long term (current) drug therapy: Secondary | ICD-10-CM | POA: Diagnosis not present

## 2016-06-01 DIAGNOSIS — E039 Hypothyroidism, unspecified: Secondary | ICD-10-CM | POA: Diagnosis not present

## 2016-06-01 DIAGNOSIS — Z87891 Personal history of nicotine dependence: Secondary | ICD-10-CM | POA: Diagnosis not present

## 2016-06-01 DIAGNOSIS — E119 Type 2 diabetes mellitus without complications: Secondary | ICD-10-CM | POA: Insufficient documentation

## 2016-06-01 DIAGNOSIS — I10 Essential (primary) hypertension: Secondary | ICD-10-CM | POA: Diagnosis not present

## 2016-06-01 MED ORDER — METOCLOPRAMIDE HCL 5 MG/ML IJ SOLN
10.0000 mg | Freq: Once | INTRAMUSCULAR | Status: AC
  Administered 2016-06-01: 10 mg via INTRAVENOUS
  Filled 2016-06-01: qty 2

## 2016-06-01 MED ORDER — DIPHENHYDRAMINE HCL 50 MG/ML IJ SOLN
25.0000 mg | Freq: Once | INTRAMUSCULAR | Status: AC
  Administered 2016-06-01: 25 mg via INTRAVENOUS
  Filled 2016-06-01: qty 1

## 2016-06-01 MED ORDER — DEXAMETHASONE SODIUM PHOSPHATE 10 MG/ML IJ SOLN
10.0000 mg | Freq: Once | INTRAMUSCULAR | Status: AC
  Administered 2016-06-01: 10 mg via INTRAVENOUS
  Filled 2016-06-01: qty 1

## 2016-06-01 MED ORDER — SODIUM CHLORIDE 0.9 % IV SOLN
INTRAVENOUS | Status: AC
  Administered 2016-06-01: 20:00:00 via INTRAVENOUS

## 2016-06-01 NOTE — ED Triage Notes (Signed)
Pt reports headache that began on Saturday. Pt reports hx of migraines. She reports sounds are amplified causing worsening of her headache.

## 2016-06-01 NOTE — ED Provider Notes (Signed)
[facility] DEPT Provider Note   CSN: [id number] Arrival date & time: 06/01/16  1748  By signing my name below, I, Dora Sims, attest that this documentation has been prepared under the direction and in the presence of Debroah Baller, NP. Electronically Signed: Dora Sims, Scribe. 06/01/2016. 6:59 PM.  History   Chief Complaint Chief Complaint  Patient presents with  . Headache    The history is provided by the patient. No language interpreter was used.  Headache   This is a recurrent problem. The current episode started more than 2 days ago. The problem occurs constantly. The problem has not changed since onset.The headache is associated with nothing. Pain location: generalized. The quality of the pain is described as throbbing. The pain is severe. The pain radiates to the left neck and right neck. Associated symptoms include nausea. Pertinent negatives include no fever, no near-syncope, no syncope and no vomiting. She has tried nothing for the symptoms.     Marland KitchenHPI Comments: SHARIE NATALI is a 50 y.o. female who presents to the Emergency Department complaining of gradual onset, constant, throbbing, severe, generalized headache beginning 3 days ago. She notes associated phonophobia, photophobia, nausea, and intermittent chills. Pt states she started seeing small white spots today. She reports a h/o recurrent headaches and states her current one feels like her past headaches. She has had a negative CT scan of her head in the past. She is a former smoker (quit date 2015). Pt denies fever, abdominal pain, back pain, vomiting, cough, congestion, dysuria, difficulty urinating, syncope, or any other associated symptoms.  Past Medical History:  Diagnosis Date  . Diabetes mellitus without complication (HCC)    Type 2  . Fibroid   . Hyperlipidemia   . Hypertension   . Hypothyroid   . 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 Thalassemia minor   . Vitamin D deficiency     Patient  Active Problem List   Diagnosis Date Noted  . Chest pain, rule out acute myocardial infarction 05/14/2016  . Chest pain 05/14/2016  . Hyperlipidemia 05/14/2016  . Hypothyroidism 05/14/2016  . Essential hypertension 05/14/2016    Past Surgical History:  Procedure Laterality Date  . CESAREAN SECTION    . CHOLECYSTECTOMY    . KNEE SURGERY    . SUBCLAVIAN STENT PLACEMENT    . TONSILLECTOMY      OB History    Gravida Para Term Preterm AB Living   6 2 2   4 2    SAB TAB Ectopic Multiple Live Births   4               Home Medications    Prior to Admission medications   Medication Sig Start Date End Date Taking? Authorizing Provider  aspirin 81 MG tablet Take 81 mg by mouth every morning.    Yes Historical Provider, MD  cholecalciferol (VITAMIN D) 1000 units tablet Take 2,000 Units by mouth daily.   Yes Historical Provider, MD  empagliflozin (JARDIANCE) 25 MG TABS tablet Take 25 mg by mouth every morning.   Yes Historical Provider, MD  FLUoxetine (PROZAC) 20 MG tablet Take 40 mg by mouth every morning.   Yes Historical Provider, MD  insulin lispro protamine-lispro (HUMALOG 75/25 MIX) (75-25) 100 UNIT/ML SUSP injection Inject 100 Units into the skin 2 (two) times daily.   Yes Historical Provider, MD  levothyroxine (SYNTHROID, LEVOTHROID) 75 MCG tablet Take 75 mcg by mouth daily.   Yes Historical Provider, MD  liraglutide (VICTOZA) 18  MG/3ML SOPN Inject 1.2 mg into the skin every morning.   Yes Historical Provider, MD  losartan (COZAAR) 100 MG tablet Take 100 mg by mouth daily.   Yes Historical Provider, MD  Multiple Vitamin (MULTIVITAMIN WITH MINERALS) TABS tablet Take 1 tablet by mouth daily.   Yes Historical Provider, MD  pantoprazole (PROTONIX) 20 MG tablet Take 20 mg by mouth every morning.   Yes Historical Provider, MD  pravastatin (PRAVACHOL) 40 MG tablet Take 40 mg by mouth daily.   Yes Historical Provider, MD  topiramate (TOPAMAX) 25 MG tablet Take 50 mg by mouth 2 (two) times  daily.   Yes Historical Provider, MD    Family History Family History  Problem Relation Age of Onset  . Diabetes Mother   . Hypertension Mother   . Hyperlipidemia Mother   . CAD Mother   . CAD Father   . Heart failure Father   . Hypertension Sister   . Hyperlipidemia Sister   . Hypertension Brother   . Hyperlipidemia Brother     Social History Social History  Substance Use Topics  . Smoking status: Former Smoker    Types: Cigarettes    Quit date: 05/14/2014  . Smokeless tobacco: Never Used  . Alcohol use No     Allergies   Ilosone [erythromycin]; Keflex [cephalexin]; Penicillins; Iron; Metformin and related; and Morphine and related   Review of Systems Review of Systems  Constitutional: Positive for chills (intermittent). Negative for fever.  HENT: Negative for congestion.   Eyes: Positive for photophobia.  Respiratory: Negative for cough.   Cardiovascular: Negative for syncope and near-syncope.  Gastrointestinal: Positive for nausea. Negative for abdominal pain and vomiting.  Genitourinary: Negative for difficulty urinating and dysuria.  Musculoskeletal: Positive for neck pain. Negative for back pain.  Neurological: Positive for headaches (generalized). Negative for syncope.  All other systems reviewed and are negative.   Physical Exam Updated Vital Signs BP 110/67 (BP Location: Left Arm)   Pulse 91   Temp 98.5 F (36.9 C) (Oral)   Resp 18   Ht 5\' 2"  (1.575 m)   Wt 100.2 kg   LMP 08/03/2013   SpO2 94%   BMI 40.42 kg/m   Physical Exam  Constitutional: She is oriented to person, place, and time. She appears well-developed and well-nourished. No distress.  HENT:  Head: Normocephalic and atraumatic.  Right Ear: Tympanic membrane normal.  Left Ear: Tympanic membrane normal.  Mouth/Throat: Uvula is midline. No posterior oropharyngeal edema or posterior oropharyngeal erythema.  Eyes: Conjunctivae and EOM are normal.  Neck: Normal range of motion. Neck  supple. No tracheal deviation present. No Brudzinski's sign and no Kernig's sign noted.  Full range of motion. No meningeal signs.  Cardiovascular: Normal rate and regular rhythm.   Pulmonary/Chest: Effort normal. No respiratory distress.  Lungs CTA.  Abdominal: Soft. Bowel sounds are normal. There is no tenderness.  No CVA tenderness.  Musculoskeletal: Normal range of motion.  Radial pulses 2+  Neurological: She is alert and oriented to person, place, and time. She displays a negative Romberg sign.  Grips are equal bilaterally. Rapid alternating movement without difficulty.  Skin: Skin is warm and dry.  Psychiatric: She has a normal mood and affect. Her behavior is normal.  Nursing note and vitals reviewed.   ED Treatments / Results  Labs (all labs ordered are listed, but only abnormal results are displayed) Labs Reviewed - No data to display   Radiology No results found.  Procedures Procedures (including critical  care time)  DIAGNOSTIC STUDIES: Oxygen Saturation is 100% on RA, normal by my interpretation.    COORDINATION OF CARE: 7:11 PM Discussed treatment plan with pt at bedside and pt agreed to plan. After migraine cocktail and IV fluids patient reports feeing much better and ready to go home. Discussed with the patient plan of care and all questioned fully answered. She will call me if any problems arise.   Medications Ordered in ED Medications  0.9 %  sodium chloride infusion ( Intravenous Stopped 06/01/16 2055)  dexamethasone (DECADRON) injection 10 mg (10 mg Intravenous Given 06/01/16 2012)  diphenhydrAMINE (BENADRYL) injection 25 mg (25 mg Intravenous Given 06/01/16 2007)  metoCLOPramide (REGLAN) injection 10 mg (10 mg Intravenous Given 06/01/16 2009)     Initial Impression / Assessment and Plan / ED Course  I have reviewed the triage vital signs and the nursing notes.  Pertinent labs & imaging results that were available during my care of the patient were  reviewed by me and considered in my medical decision making (see chart for details).  Clinical Course   I personally performed the services described in this documentation, which was scribed in my presence. The recorded information has been reviewed and is accurate.   Final Clinical Impressions(s) / ED Diagnoses   Final diagnoses:  Other headache syndrome    New Prescriptions New Prescriptions   No medications on file     [facility], NP 06/01/16 2105    Leonard Schwartz, MD 06/12/16 2146

## 2016-06-01 NOTE — ED Notes (Signed)
Pt verbalized understanding of discharge paperwork and follow up recommendations.  Signature pad not working in the room.

## 2016-06-01 NOTE — ED Notes (Signed)
This RN attempted IV x 2 without success.  Have reached out for Charge RN.

## 2016-06-01 NOTE — ED Notes (Signed)
IV attempt x 2 unsuccessful.

## 2016-06-01 NOTE — ED Notes (Signed)
Pt states she has had a headache for the 4 days. Pt states she has a history of headaches, not migraines. Pt is wearing sunglasses and has her fingers in her ears.

## 2016-06-01 NOTE — ED Notes (Signed)
Patient able to ambulate independently  

## 2016-06-01 NOTE — Discharge Instructions (Signed)
Follow up with your doctor, return here as needed.  °

## 2016-06-28 ENCOUNTER — Ambulatory Visit: Payer: 59 | Admitting: Dietician

## 2016-08-04 DIAGNOSIS — I10 Essential (primary) hypertension: Secondary | ICD-10-CM | POA: Diagnosis not present

## 2016-08-04 DIAGNOSIS — E1165 Type 2 diabetes mellitus with hyperglycemia: Secondary | ICD-10-CM | POA: Diagnosis not present

## 2016-08-04 DIAGNOSIS — J45909 Unspecified asthma, uncomplicated: Secondary | ICD-10-CM | POA: Diagnosis not present

## 2016-08-07 DIAGNOSIS — E1165 Type 2 diabetes mellitus with hyperglycemia: Secondary | ICD-10-CM | POA: Diagnosis not present

## 2016-08-07 DIAGNOSIS — J45909 Unspecified asthma, uncomplicated: Secondary | ICD-10-CM | POA: Diagnosis not present

## 2016-08-07 DIAGNOSIS — I10 Essential (primary) hypertension: Secondary | ICD-10-CM | POA: Diagnosis not present

## 2016-08-10 DIAGNOSIS — E11319 Type 2 diabetes mellitus with unspecified diabetic retinopathy without macular edema: Secondary | ICD-10-CM | POA: Diagnosis not present

## 2016-08-10 DIAGNOSIS — R911 Solitary pulmonary nodule: Secondary | ICD-10-CM | POA: Diagnosis not present

## 2016-08-10 DIAGNOSIS — E113293 Type 2 diabetes mellitus with mild nonproliferative diabetic retinopathy without macular edema, bilateral: Secondary | ICD-10-CM | POA: Diagnosis not present

## 2016-08-10 DIAGNOSIS — I10 Essential (primary) hypertension: Secondary | ICD-10-CM | POA: Diagnosis not present

## 2016-08-26 DIAGNOSIS — M25562 Pain in left knee: Secondary | ICD-10-CM | POA: Diagnosis not present

## 2016-08-26 DIAGNOSIS — W19XXXA Unspecified fall, initial encounter: Secondary | ICD-10-CM | POA: Diagnosis not present

## 2016-08-26 DIAGNOSIS — S60222A Contusion of left hand, initial encounter: Secondary | ICD-10-CM | POA: Diagnosis not present

## 2016-08-26 DIAGNOSIS — M25552 Pain in left hip: Secondary | ICD-10-CM | POA: Diagnosis not present

## 2016-08-26 DIAGNOSIS — Y92099 Unspecified place in other non-institutional residence as the place of occurrence of the external cause: Secondary | ICD-10-CM | POA: Diagnosis not present

## 2016-08-26 DIAGNOSIS — S60512A Abrasion of left hand, initial encounter: Secondary | ICD-10-CM | POA: Diagnosis not present

## 2016-08-26 DIAGNOSIS — M25572 Pain in left ankle and joints of left foot: Secondary | ICD-10-CM | POA: Diagnosis not present

## 2016-08-26 DIAGNOSIS — M25512 Pain in left shoulder: Secondary | ICD-10-CM | POA: Diagnosis not present

## 2016-08-26 DIAGNOSIS — M25522 Pain in left elbow: Secondary | ICD-10-CM | POA: Diagnosis not present

## 2016-08-26 DIAGNOSIS — M79642 Pain in left hand: Secondary | ICD-10-CM | POA: Diagnosis not present

## 2016-08-26 DIAGNOSIS — S46912A Strain of unspecified muscle, fascia and tendon at shoulder and upper arm level, left arm, initial encounter: Secondary | ICD-10-CM | POA: Diagnosis not present

## 2016-08-31 DIAGNOSIS — K3184 Gastroparesis: Secondary | ICD-10-CM | POA: Diagnosis not present

## 2016-08-31 DIAGNOSIS — Z794 Long term (current) use of insulin: Secondary | ICD-10-CM | POA: Diagnosis not present

## 2016-08-31 DIAGNOSIS — E039 Hypothyroidism, unspecified: Secondary | ICD-10-CM | POA: Diagnosis not present

## 2016-08-31 DIAGNOSIS — E1143 Type 2 diabetes mellitus with diabetic autonomic (poly)neuropathy: Secondary | ICD-10-CM | POA: Diagnosis not present

## 2016-08-31 DIAGNOSIS — E1165 Type 2 diabetes mellitus with hyperglycemia: Secondary | ICD-10-CM | POA: Diagnosis not present

## 2016-09-11 DIAGNOSIS — N390 Urinary tract infection, site not specified: Secondary | ICD-10-CM | POA: Diagnosis not present

## 2016-09-11 DIAGNOSIS — R51 Headache: Secondary | ICD-10-CM | POA: Diagnosis not present

## 2016-09-11 DIAGNOSIS — R5383 Other fatigue: Secondary | ICD-10-CM | POA: Diagnosis not present

## 2016-10-20 DIAGNOSIS — E119 Type 2 diabetes mellitus without complications: Secondary | ICD-10-CM | POA: Diagnosis not present

## 2016-11-23 DIAGNOSIS — E1165 Type 2 diabetes mellitus with hyperglycemia: Secondary | ICD-10-CM | POA: Diagnosis not present

## 2016-11-23 DIAGNOSIS — Z794 Long term (current) use of insulin: Secondary | ICD-10-CM | POA: Diagnosis not present

## 2016-12-15 ENCOUNTER — Encounter (HOSPITAL_COMMUNITY): Payer: Self-pay | Admitting: Family Medicine

## 2016-12-15 ENCOUNTER — Ambulatory Visit (HOSPITAL_COMMUNITY)
Admission: EM | Admit: 2016-12-15 | Discharge: 2016-12-15 | Disposition: A | Discharge status: Home / Self Care | Payer: 59 | Attending: Family Medicine | Admitting: Family Medicine

## 2016-12-15 ENCOUNTER — Ambulatory Visit (INDEPENDENT_AMBULATORY_CARE_PROVIDER_SITE_OTHER): Payer: 59

## 2016-12-15 DIAGNOSIS — S93602A Unspecified sprain of left foot, initial encounter: Secondary | ICD-10-CM | POA: Diagnosis not present

## 2016-12-15 MED ORDER — TRAMADOL HCL 50 MG PO TABS: 50.0000 mg | ORAL_TABLET | Freq: Four times a day (QID) | ORAL | 0 refills | Status: DC | PRN

## 2016-12-15 NOTE — ED Provider Notes (Addendum)
[facility]    CSN: [id number] Arrival date & time: 12/15/16  [place]     History   Chief Complaint Chief Complaint  Patient presents with  . Ankle Pain    HPI Morgan [name] is a 51 y.o. female.   This is a 51 year old woman who stepped in a hole and twisted her left foot.  This happened about 2 hours PTA.  Pain is primarily over the dorsal lateral aspect of the foot, with burning pain radiating into the toes.  She has taken tramadol in the past without significant side effects other than drowsiness.      Past Medical History:  Diagnosis Date  . Diabetes mellitus without complication (HCC)    Type 2  . Fibroid   . Hyperlipidemia   . Hypertension   . Hypothyroid   . 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 Thalassemia minor   . Vitamin D deficiency     Patient Active Problem List   Diagnosis Date Noted  . Chest pain, rule out acute myocardial infarction 05/14/2016  . Chest pain 05/14/2016  . Hyperlipidemia 05/14/2016  . Hypothyroidism 05/14/2016  . Essential hypertension 05/14/2016    Past Surgical History:  Procedure Laterality Date  . CESAREAN SECTION    . CHOLECYSTECTOMY    . KNEE SURGERY    . SUBCLAVIAN STENT PLACEMENT    . TONSILLECTOMY      OB History    Gravida Para Term Preterm AB Living   6 2 2   4 2    SAB TAB Ectopic Multiple Live Births   4               Home Medications    Prior to Admission medications   Medication Sig Start Date End Date Taking? Authorizing Provider  aspirin 81 MG tablet Take 81 mg by mouth every morning.     [provider]  cholecalciferol (VITAMIN D) 1000 units tablet Take 2,000 Units by mouth daily.    [provider]  empagliflozin (JARDIANCE) 25 MG TABS tablet Take 25 mg by mouth every morning.    [provider]  FLUoxetine (PROZAC) 20 MG tablet Take 40 mg by mouth every morning.    [provider]  insulin lispro protamine-lispro (HUMALOG 75/25 MIX) (75-25) 100  UNIT/ML SUSP injection Inject 100 Units into the skin 2 (two) times daily.    [provider]  levothyroxine (SYNTHROID, LEVOTHROID) 75 MCG tablet Take 125 mcg by mouth daily.     [provider]  liraglutide (VICTOZA) 18 MG/3ML SOPN Inject 1.2 mg into the skin every morning.    [provider]  losartan (COZAAR) 100 MG tablet Take 100 mg by mouth daily.    [provider]  Multiple Vitamin (MULTIVITAMIN WITH MINERALS) TABS tablet Take 1 tablet by mouth daily.    [provider]  pantoprazole (PROTONIX) 20 MG tablet Take 20 mg by mouth every morning.    [provider]  pravastatin (PRAVACHOL) 40 MG tablet Take 40 mg by mouth daily.    [provider]  topiramate (TOPAMAX) 25 MG tablet Take 50 mg by mouth 2 (two) times daily.    [provider]  traMADol (ULTRAM) 50 MG tablet Take 1 tablet (50 mg total) by mouth every 6 (six) hours as needed. 12/15/16   Robyn Haber, MD    Family History Family History  Problem Relation Age of Onset  . Diabetes Mother   . Hypertension Mother   .  Hyperlipidemia Mother   . CAD Mother   . CAD Father   . Heart failure Father   . Hypertension Sister   . Hyperlipidemia Sister   . Hypertension Brother   . Hyperlipidemia Brother     Social History Social History  Substance Use Topics  . Smoking status: Former Smoker    Types: Cigarettes    Quit date: 05/14/2014  . Smokeless tobacco: Never Used  . Alcohol use No     Allergies   Ilosone [erythromycin]; Keflex [cephalexin]; Penicillins; Iron; Metformin and related; and Morphine and related   Review of Systems Review of Systems  All other systems reviewed and are negative.    Physical Exam Triage Vital Signs ED Triage Vitals [12/15/16 1903]  Enc Vitals Group     BP      Pulse      Resp      Temp      Temp src      SpO2      Weight      Height      Head Circumference      Peak Flow      Pain Score 10     Pain  Loc      Pain Edu?      Excl. in [place]?    No data found.   Updated Vital Signs LMP 08/03/2013    Physical Exam  Constitutional: She is oriented to person, place, and time. She appears well-developed and well-nourished.  HENT:  Right Ear: External ear normal.  Left Ear: External ear normal.  Mouth/Throat: Oropharynx is clear and moist.  Eyes: Conjunctivae are normal. Pupils are equal, round, and reactive to light.  Neck: Normal range of motion. Neck supple.  Pulmonary/Chest: Effort normal.  Musculoskeletal:  Swollen left foot with erythematous changes at the distal aspect.  Neurological: She is alert and oriented to person, place, and time.  Skin: Skin is warm and dry. There is erythema.  Nursing note and vitals reviewed.    UC Treatments / Results  Labs (all labs ordered are listed, but only abnormal results are displayed) Labs Reviewed - No data to display  EKG  EKG Interpretation None       Radiology Dg Foot Complete Left  Result Date: 12/15/2016 CLINICAL DATA:  Stepped in hole and twisted foot today. EXAM: LEFT FOOT - COMPLETE 3+ VIEW COMPARISON:  None. FINDINGS: Tiny fracture fragment anterior to the distal tibia seen on lateral radiograph. No dislocation. The ankle mortise appears congruent and the tibiofibular syndesmosis intact. Moderate plantar calcaneal spur. No destructive bony lesions. Diffuse soft tissue swelling without subcutaneous gas or radiopaque foreign bodies. IMPRESSION: Acute tiny suspected avulsion fracture anterior distal tibia, recommend LEFT ankle radiograph. Electronically Signed   By: Elon Alas M.D.   On: 12/15/2016 19:39    Procedures Procedures (including critical care time)  Medications Ordered in UC Medications - No data to display   Initial Impression / Assessment and Plan / UC Course  I have reviewed the triage vital signs and the nursing notes.  Pertinent labs & imaging results that were available during my care of the  patient were reviewed by me and considered in my medical decision making (see chart for details).   I gave some consideration for a Lisfranc injury given the patient between the first and second meta-tarsals. Given the x-ray reading and lack of bony abnormality in terms of fracture, I'm more inclined to think this is a foot sprain. I'm impressed  upon the patient that she should return to the emergency department for a CT in several days if she is not improving.  Final Clinical Impressions(s) / UC Diagnoses   Final diagnoses:  Foot sprain, left, initial encounter    New Prescriptions New Prescriptions   TRAMADOL (ULTRAM) 50 MG TABLET    Take 1 tablet (50 mg total) by mouth every 6 (six) hours as needed.     Robyn Haber, MD 12/15/16 Lattie Corns    Robyn Haber, MD 12/15/16 [phone]

## 2016-12-15 NOTE — ED Triage Notes (Signed)
Stepped     In    A  Hole      And   Twisted   l  Ankle    Swelling   Present

## 2016-12-15 NOTE — Discharge Instructions (Signed)
Expect significant improvement by Saturday.

## 2016-12-20 ENCOUNTER — Encounter (HOSPITAL_COMMUNITY): Payer: Self-pay | Admitting: Emergency Medicine

## 2016-12-20 ENCOUNTER — Emergency Department (HOSPITAL_COMMUNITY): Payer: 59

## 2016-12-20 ENCOUNTER — Emergency Department (HOSPITAL_COMMUNITY)
Admission: EM | Admit: 2016-12-20 | Discharge: 2016-12-20 | Disposition: A | Discharge status: Home / Self Care | Payer: 59 | Attending: Emergency Medicine | Admitting: Emergency Medicine

## 2016-12-20 DIAGNOSIS — Z87891 Personal history of nicotine dependence: Secondary | ICD-10-CM | POA: Diagnosis not present

## 2016-12-20 DIAGNOSIS — S93402A Sprain of unspecified ligament of left ankle, initial encounter: Secondary | ICD-10-CM

## 2016-12-20 DIAGNOSIS — Z79899 Other long term (current) drug therapy: Secondary | ICD-10-CM | POA: Diagnosis not present

## 2016-12-20 DIAGNOSIS — Z7982 Long term (current) use of aspirin: Secondary | ICD-10-CM | POA: Diagnosis not present

## 2016-12-20 DIAGNOSIS — Y999 Unspecified external cause status: Secondary | ICD-10-CM | POA: Diagnosis not present

## 2016-12-20 DIAGNOSIS — Y929 Unspecified place or not applicable: Secondary | ICD-10-CM | POA: Diagnosis not present

## 2016-12-20 DIAGNOSIS — Y939 Activity, unspecified: Secondary | ICD-10-CM | POA: Diagnosis not present

## 2016-12-20 DIAGNOSIS — S99912A Unspecified injury of left ankle, initial encounter: Secondary | ICD-10-CM | POA: Diagnosis present

## 2016-12-20 DIAGNOSIS — M79672 Pain in left foot: Secondary | ICD-10-CM | POA: Diagnosis not present

## 2016-12-20 DIAGNOSIS — I10 Essential (primary) hypertension: Secondary | ICD-10-CM | POA: Insufficient documentation

## 2016-12-20 DIAGNOSIS — E119 Type 2 diabetes mellitus without complications: Secondary | ICD-10-CM | POA: Insufficient documentation

## 2016-12-20 DIAGNOSIS — M8589 Other specified disorders of bone density and structure, multiple sites: Secondary | ICD-10-CM | POA: Diagnosis not present

## 2016-12-20 DIAGNOSIS — X509XXA Other and unspecified overexertion or strenuous movements or postures, initial encounter: Secondary | ICD-10-CM | POA: Diagnosis not present

## 2016-12-20 DIAGNOSIS — Z794 Long term (current) use of insulin: Secondary | ICD-10-CM | POA: Insufficient documentation

## 2016-12-20 DIAGNOSIS — E039 Hypothyroidism, unspecified: Secondary | ICD-10-CM | POA: Insufficient documentation

## 2016-12-20 DIAGNOSIS — M25572 Pain in left ankle and joints of left foot: Secondary | ICD-10-CM | POA: Diagnosis not present

## 2016-12-20 NOTE — Discharge Instructions (Signed)
Elevate ankle, Ice to area of swelling.   See your Physician for recheck in 1 week

## 2016-12-20 NOTE — ED Notes (Signed)
Pt twisted her ankle on Wednesday and hit her ankle on a desk on Friday. CMS intact.

## 2016-12-20 NOTE — Progress Notes (Signed)
Orthopedic Tech Progress Note Patient Details:  Morgan [name] 1966/01/17 [id number]  Ortho Devices Type of Ortho Device: CAM walker Ortho Device/Splint Location: LLE Ortho Device/Splint Interventions: Ordered, Application   Braulio Bosch 12/20/2016, 10:34 PM

## 2016-12-20 NOTE — ED Provider Notes (Signed)
[facility] DEPT Provider Note   CSN: [id number] Arrival date & time: 12/20/16  1817  By signing my name below, I, Neta Mends, attest that this documentation has been prepared under the direction and in the presence of Alyse Low, [place]. Electronically Signed: Neta Mends, ED Scribe. 12/20/2016. 8:55 PM.    History   Chief Complaint Chief Complaint  Patient presents with  . Ankle Pain     The history is provided by the patient. No language interpreter was used.   HPI Comments:  Morgan [name] is a 51 y.o. female who presents to the Emergency Department complaining of constant left ankle pain x 5 days. Pt reports that she twisted her ankle in a hole 5 days ago, and went to [facility] urgent care for an x-ray. She states that the pain and swelling were improving, but then she hit her left ankle on her desk at work today which worsened the pain and swelling. She states that the pain radiates up her left leg to her back. No alleviating factors noted. Pt denies other associated symptoms.    Past Medical History:  Diagnosis Date  . Diabetes mellitus without complication (HCC)    Type 2  . Fibroid   . Hyperlipidemia   . Hypertension   . Hypothyroid   . 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 Thalassemia minor   . Vitamin D deficiency     Patient Active Problem List   Diagnosis Date Noted  . Chest pain, rule out acute myocardial infarction 05/14/2016  . Chest pain 05/14/2016  . Hyperlipidemia 05/14/2016  . Hypothyroidism 05/14/2016  . Essential hypertension 05/14/2016    Past Surgical History:  Procedure Laterality Date  . CESAREAN SECTION    . CHOLECYSTECTOMY    . KNEE SURGERY    . SUBCLAVIAN STENT PLACEMENT    . TONSILLECTOMY      OB History    Gravida Para Term Preterm AB Living   6 2 2   4 2    SAB TAB Ectopic Multiple Live Births   4               Home Medications    Prior to Admission medications   Medication Sig Start Date End Date Taking?  Authorizing Provider  aspirin 81 MG tablet Take 81 mg by mouth every morning.     [provider]  cholecalciferol (VITAMIN D) 1000 units tablet Take 2,000 Units by mouth daily.    [provider]  empagliflozin (JARDIANCE) 25 MG TABS tablet Take 25 mg by mouth every morning.    [provider]  FLUoxetine (PROZAC) 20 MG tablet Take 40 mg by mouth every morning.    [provider]  insulin lispro protamine-lispro (HUMALOG 75/25 MIX) (75-25) 100 UNIT/ML SUSP injection Inject 100 Units into the skin 2 (two) times daily.    [provider]  levothyroxine (SYNTHROID, LEVOTHROID) 75 MCG tablet Take 125 mcg by mouth daily.     [provider]  liraglutide (VICTOZA) 18 MG/3ML SOPN Inject 1.2 mg into the skin every morning.    [provider]  losartan (COZAAR) 100 MG tablet Take 100 mg by mouth daily.    [provider]  Multiple Vitamin (MULTIVITAMIN WITH MINERALS) TABS tablet Take 1 tablet by mouth daily.    [provider]  pantoprazole (PROTONIX) 20 MG tablet Take 20 mg by mouth every morning.    [provider]  pravastatin (PRAVACHOL) 40 MG tablet Take  40 mg by mouth daily.    [provider]  topiramate (TOPAMAX) 25 MG tablet Take 50 mg by mouth 2 (two) times daily.    [provider]  traMADol (ULTRAM) 50 MG tablet Take 1 tablet (50 mg total) by mouth every 6 (six) hours as needed. 12/15/16   Robyn Haber, MD    Family History Family History  Problem Relation Age of Onset  . Diabetes Mother   . Hypertension Mother   . Hyperlipidemia Mother   . CAD Mother   . CAD Father   . Heart failure Father   . Hypertension Sister   . Hyperlipidemia Sister   . Hypertension Brother   . Hyperlipidemia Brother     Social History Social History  Substance Use Topics  . Smoking status: Former Smoker    Types: Cigarettes    Quit date: 05/14/2014  . Smokeless tobacco: Never Used  . Alcohol  use No     Allergies   Ilosone [erythromycin]; Keflex [cephalexin]; Penicillins; Iron; Metformin and related; and Morphine and related   Review of Systems Review of Systems  Constitutional: Negative for fever.  Musculoskeletal: Positive for arthralgias and myalgias.     Physical Exam Updated Vital Signs BP (!) 157/97 (BP Location: Right Arm)   Pulse (!) 115   Temp 99.5 F (37.5 C) (Oral)   Resp 19   Ht 5\' 2"  (1.575 m)   Wt 210 lb (95.3 kg)   LMP 08/03/2013   SpO2 96%   BMI 38.41 kg/m   Physical Exam  Constitutional: She appears well-developed and well-nourished. No distress.  HENT:  Head: Normocephalic and atraumatic.  Eyes: Conjunctivae are normal.  Cardiovascular: Normal rate.   Pulmonary/Chest: Effort normal.  Abdominal: She exhibits no distension.  Neurological: She is alert.  Skin: Skin is warm and dry.  Psychiatric: She has a normal mood and affect.  Nursing note and vitals reviewed.    ED Treatments / Results  DIAGNOSTIC STUDIES:  Oxygen Saturation is 96% on RA, normal by my interpretation.    COORDINATION OF CARE:  8:53 PM Discussed treatment plan with pt at bedside and pt agreed to plan.   Labs (all labs ordered are listed, but only abnormal results are displayed) Labs Reviewed - No data to display  EKG  EKG Interpretation None       Radiology No results found.  Procedures Procedures (including critical care time)  Medications Ordered in ED Medications - No data to display   Initial Impression / Assessment and Plan / ED Course  I have reviewed the triage vital signs and the nursing notes.  Pertinent labs & imaging results that were available during my care of the patient were reviewed by me and considered in my medical decision making (see chart for details).       Final Clinical Impressions(s) / ED Diagnoses   Final diagnoses:  Sprain of left ankle, unspecified ligament, initial encounter    New  Prescriptions Discharge Medication List as of 12/20/2016 10:32 PM    cam walker An After Visit Summary was printed and given to the patient.  I personally performed the services in this documentation, which was scribed in my presence.  The recorded information has been reviewed and considered.   Ronnald Collum.    Sidney Ace 12/21/16 0133    Davonna Belling, MD 12/22/16 2100

## 2016-12-20 NOTE — ED Notes (Signed)
Pt verbalized understanding of d/c instructions and has no further questions. Pt is stable, A&Ox4, VSS.  

## 2016-12-20 NOTE — ED Triage Notes (Signed)
Pt st's she twisted left ankle 5 days ago and was seen at Urgent Care for same.  St's today she hit left ankle on her desk and now has pain in left ankle radiating up to hip and back

## 2017-01-10 DIAGNOSIS — E119 Type 2 diabetes mellitus without complications: Secondary | ICD-10-CM | POA: Diagnosis not present

## 2017-01-17 DIAGNOSIS — E1110 Type 2 diabetes mellitus with ketoacidosis without coma: Secondary | ICD-10-CM | POA: Diagnosis not present

## 2017-01-17 DIAGNOSIS — E785 Hyperlipidemia, unspecified: Secondary | ICD-10-CM | POA: Diagnosis not present

## 2017-01-17 DIAGNOSIS — Z794 Long term (current) use of insulin: Secondary | ICD-10-CM | POA: Diagnosis not present

## 2017-01-17 DIAGNOSIS — Z9119 Patient's noncompliance with other medical treatment and regimen: Secondary | ICD-10-CM | POA: Diagnosis not present

## 2017-01-17 DIAGNOSIS — Z9641 Presence of insulin pump (external) (internal): Secondary | ICD-10-CM | POA: Diagnosis not present

## 2017-01-17 DIAGNOSIS — R112 Nausea with vomiting, unspecified: Secondary | ICD-10-CM | POA: Diagnosis not present

## 2017-01-17 DIAGNOSIS — I10 Essential (primary) hypertension: Secondary | ICD-10-CM | POA: Diagnosis not present

## 2017-01-17 DIAGNOSIS — R42 Dizziness and giddiness: Secondary | ICD-10-CM | POA: Diagnosis not present

## 2017-01-25 DIAGNOSIS — R112 Nausea with vomiting, unspecified: Secondary | ICD-10-CM | POA: Diagnosis not present

## 2017-01-25 DIAGNOSIS — Z1231 Encounter for screening mammogram for malignant neoplasm of breast: Secondary | ICD-10-CM | POA: Diagnosis not present

## 2017-01-25 DIAGNOSIS — R918 Other nonspecific abnormal finding of lung field: Secondary | ICD-10-CM | POA: Diagnosis not present

## 2017-01-25 DIAGNOSIS — E1169 Type 2 diabetes mellitus with other specified complication: Secondary | ICD-10-CM | POA: Diagnosis not present

## 2017-03-14 DIAGNOSIS — J069 Acute upper respiratory infection, unspecified: Secondary | ICD-10-CM | POA: Diagnosis not present

## 2017-04-07 DIAGNOSIS — E119 Type 2 diabetes mellitus without complications: Secondary | ICD-10-CM | POA: Diagnosis not present

## 2017-06-07 DIAGNOSIS — M5441 Lumbago with sciatica, right side: Secondary | ICD-10-CM | POA: Diagnosis not present

## 2017-06-27 ENCOUNTER — Emergency Department (HOSPITAL_COMMUNITY): Payer: 59

## 2017-06-27 ENCOUNTER — Other Ambulatory Visit: Payer: Self-pay

## 2017-06-27 ENCOUNTER — Emergency Department (HOSPITAL_COMMUNITY)
Admission: EM | Admit: 2017-06-27 | Discharge: 2017-06-27 | Disposition: A | Discharge status: Home / Self Care | Payer: 59 | Attending: Emergency Medicine | Admitting: Emergency Medicine

## 2017-06-27 ENCOUNTER — Encounter (HOSPITAL_COMMUNITY): Payer: Self-pay

## 2017-06-27 DIAGNOSIS — R05 Cough: Secondary | ICD-10-CM | POA: Diagnosis present

## 2017-06-27 DIAGNOSIS — Z7982 Long term (current) use of aspirin: Secondary | ICD-10-CM | POA: Diagnosis not present

## 2017-06-27 DIAGNOSIS — B372 Candidiasis of skin and nail: Secondary | ICD-10-CM | POA: Diagnosis not present

## 2017-06-27 DIAGNOSIS — I10 Essential (primary) hypertension: Secondary | ICD-10-CM | POA: Diagnosis not present

## 2017-06-27 DIAGNOSIS — Z7902 Long term (current) use of antithrombotics/antiplatelets: Secondary | ICD-10-CM | POA: Diagnosis not present

## 2017-06-27 DIAGNOSIS — Z7984 Long term (current) use of oral hypoglycemic drugs: Secondary | ICD-10-CM | POA: Diagnosis not present

## 2017-06-27 DIAGNOSIS — E039 Hypothyroidism, unspecified: Secondary | ICD-10-CM | POA: Insufficient documentation

## 2017-06-27 DIAGNOSIS — Z87891 Personal history of nicotine dependence: Secondary | ICD-10-CM | POA: Insufficient documentation

## 2017-06-27 DIAGNOSIS — E119 Type 2 diabetes mellitus without complications: Secondary | ICD-10-CM | POA: Diagnosis not present

## 2017-06-27 DIAGNOSIS — H9209 Otalgia, unspecified ear: Secondary | ICD-10-CM | POA: Diagnosis not present

## 2017-06-27 DIAGNOSIS — J069 Acute upper respiratory infection, unspecified: Secondary | ICD-10-CM

## 2017-06-27 MED ORDER — KETOCONAZOLE 2 % EX CREA: 1.0000 "application " | TOPICAL_CREAM | Freq: Every day | CUTANEOUS | 0 refills | Status: AC

## 2017-06-27 NOTE — ED Provider Notes (Signed)
[name] Provider Note   CSN: [id number] Arrival date & time: 06/27/17  6433     History   Chief Complaint Chief Complaint  Patient presents with  . Cough    HPI Morgan [name] is a 51 y.o. female presenting with sudden onset upper respiratory symptoms including nonproductive cough, sore throat after coughing, right earache and generalized myalgia for 2 days. Known ill contacts with similar symptoms. Patient reported that she did not get her flu shot this year. Denies fever, chills, nausea vomiting other than some nausea after coughing spells. She also reports that 2 days ago as she was showering, she noted some purulence to her left groin and has been experiencing a burning discomfort in the left groin. No urinary symptoms, dysuria, hematuria.  HPI  Past Medical History:  Diagnosis Date  . Diabetes mellitus without complication (HCC)    Type 2  . Fibroid   . Hyperlipidemia   . Hypertension   . Hypothyroid   . 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 Thalassemia minor   . Vitamin D deficiency     Patient Active Problem List   Diagnosis Date Noted  . Chest pain, rule out acute myocardial infarction 05/14/2016  . Chest pain 05/14/2016  . Hyperlipidemia 05/14/2016  . Hypothyroidism 05/14/2016  . Essential hypertension 05/14/2016    Past Surgical History:  Procedure Laterality Date  . CESAREAN SECTION    . CHOLECYSTECTOMY    . KNEE SURGERY    . SUBCLAVIAN STENT PLACEMENT    . TONSILLECTOMY      OB History    Gravida Para Term Preterm AB Living   6 2 2   4 2    SAB TAB Ectopic Multiple Live Births   4               Home Medications    Prior to Admission medications   Medication Sig Start Date End Date Taking? Authorizing Provider  aspirin 81 MG tablet Take 81 mg by mouth every morning.     [provider]  cholecalciferol (VITAMIN D) 1000 units tablet Take 2,000 Units by mouth daily.    [provider]  empagliflozin (JARDIANCE) 25 MG TABS tablet Take 25 mg by mouth every morning.    [provider]  FLUoxetine (PROZAC) 20 MG tablet Take 40 mg by mouth every morning.    [provider]  insulin lispro protamine-lispro (HUMALOG 75/25 MIX) (75-25) 100 UNIT/ML SUSP injection Inject 100 Units into the skin 2 (two) times daily.    [provider]  ketoconazole (NIZORAL) 2 % cream Apply 1 application topically daily for 14 days. 06/27/17 07/11/17  Emeline General, PA-C  levothyroxine (SYNTHROID, LEVOTHROID) 75 MCG tablet Take 125 mcg by mouth daily.     [provider]  liraglutide (VICTOZA) 18 MG/3ML SOPN Inject 1.2 mg into the skin every morning.    [provider]  losartan (COZAAR) 100 MG tablet Take 100 mg by mouth daily.    [provider]  Multiple Vitamin (MULTIVITAMIN WITH MINERALS) TABS tablet Take 1 tablet by mouth daily.    [provider]  pantoprazole (PROTONIX) 20 MG tablet Take 20 mg by mouth every morning.    [provider]  pravastatin (PRAVACHOL) 40 MG tablet Take 40 mg by mouth daily.    [provider]  topiramate (TOPAMAX) 25 MG tablet Take 50 mg by mouth 2 (two) times daily.    [provider]  traMADol (ULTRAM) 50 MG tablet Take 1 tablet (50 mg total) by mouth every 6 (six) hours as needed. 12/15/16   Robyn Haber, MD    Family History Family History  Problem Relation Age of Onset  . Diabetes Mother   . Hypertension Mother   . Hyperlipidemia Mother   . CAD Mother   . CAD Father   . Heart failure Father   . Hypertension Sister   . Hyperlipidemia Sister   . Hypertension Brother   . Hyperlipidemia Brother     Social History Social History   Tobacco Use  . Smoking status: Former Smoker    Types: Cigarettes    Last attempt to quit: 05/14/2014    Years since quitting: 3.1  . Smokeless tobacco: Never Used  Substance Use Topics  . Alcohol use: No  .  Drug use: No     Allergies   Ilosone [erythromycin]; Keflex [cephalexin]; Penicillins; Iron; Metformin and related; and Morphine and related   Review of Systems Review of Systems  Constitutional: Negative for chills and fever.  HENT: Positive for congestion, ear pain, sinus pressure and sore throat. Negative for ear discharge, sinus pain, tinnitus, trouble swallowing and voice change.   Respiratory: Positive for cough. Negative for choking, chest tightness, wheezing and stridor.   Cardiovascular: Negative for chest pain and palpitations.  Gastrointestinal: Negative for abdominal pain, diarrhea, nausea and vomiting.  Genitourinary: Negative for difficulty urinating, dysuria, frequency and hematuria.  Musculoskeletal: Positive for arthralgias and myalgias. Negative for gait problem, joint swelling, neck pain and neck stiffness.  Skin: Positive for color change and rash. Negative for pallor.       Left inguinal region with erythema and exudate  Neurological: Negative for dizziness, weakness, light-headedness, numbness and headaches.     Physical Exam Updated Vital Signs BP 140/75 (BP Location: Right Arm)   Pulse 99   Temp 98.6 F (37 C) (Oral)   Resp 16   Ht 5\' 2"  (1.575 m)   Wt 113.4 kg (250 lb)   LMP 08/03/2013   SpO2 95%   BMI 45.73 kg/m   Physical Exam  Constitutional: She appears well-developed and well-nourished. No distress.  Afebrile, nontoxic-appearing, sitting comfortably in chair in no acute distress.  HENT:  Head: Normocephalic and atraumatic.  Right Ear: External ear normal.  Left Ear: External ear normal.  Mouth/Throat: Oropharynx is clear and moist. No oropharyngeal exudate.  Right TM slightly erythematous but nonbulging and without effusion. Normal left TM.  Eyes: Conjunctivae and EOM are normal. Right eye exhibits no discharge. Left eye exhibits no discharge.  Neck: Normal range of motion. Neck supple.  Cardiovascular: Normal rate, regular rhythm and  normal heart sounds.  No murmur heard. Pulmonary/Chest: Effort normal and breath sounds normal. No stridor. No respiratory distress. She has no wheezes. She has no rales.  Lungs CTA bilaterally  Abdominal: She exhibits no distension.  Musculoskeletal: Normal range of motion. She exhibits no edema, tenderness or deformity.  Lymphadenopathy:    She has no cervical adenopathy.  Neurological: She is alert.  Skin: Skin is warm and dry. Rash noted. She is not diaphoretic. There is erythema. No pallor.  Purulent intertrigo of the left inguinal region. No abscess amenable to I&D  Psychiatric: She has a normal mood and affect.  Nursing note and vitals reviewed.    ED Treatments / Results  Labs (all labs ordered are listed, but only abnormal results are displayed) Labs Reviewed - No data to display  EKG  EKG Interpretation None       Radiology Dg Chest 2 View  Result Date: 06/27/2017 CLINICAL DATA:  Cough EXAM: CHEST  2 VIEW COMPARISON:  05/13/2016 FINDINGS: Low lung volumes. Heart and mediastinal contours are within normal limits. No focal opacities or effusions. No acute bony abnormality. IMPRESSION: Low volumes.  No active cardiopulmonary disease. Electronically Signed   By: Rolm Baptise M.D.   On: 06/27/2017 10:54    Procedures Procedures (including critical care time)  Medications Ordered in ED Medications - No data to display   Initial Impression / Assessment and Plan / ED Course  I have reviewed the triage vital signs and the nursing notes.  Pertinent labs & imaging results that were available during my care of the patient were reviewed by me and considered in my medical decision making (see chart for details).    Pt CXR negative for acute infiltrate. Patients symptoms are consistent with URI, likely viral etiology. Discussed that antibiotics are not indicated for viral infections. Pt will be discharged with symptomatic treatment.  Verbalizes understanding and is  agreeable with plan. Pt is hemodynamically stable & in NAD prior to dc.  Patient with intertrigo of the left inguinal region, is charge home with antifungal cream.  Patient has a PCP and will be following up in clinic. Advised hydration and rest and conservative symptomatic relief.  Discussed strict return precautions and advised to return to the emergency [name] if experiencing any new or worsening symptoms. Instructions were understood and patient agreed with discharge plan.  Final Clinical Impressions(s) / ED Diagnoses   Final diagnoses:  Candidal intertrigo  Viral upper respiratory tract infection  Ear ache    ED Discharge Orders        Ordered    ketoconazole (NIZORAL) 2 % cream  Daily     06/27/17 1310       Dossie Der 06/27/17 1321    Julianne Rice, MD 06/27/17 1718

## 2017-06-27 NOTE — ED Notes (Signed)
Patient c/o headache with earache and sorethroat onset 2 days ago, also c/o abscess in left groin area.

## 2017-06-27 NOTE — ED Triage Notes (Addendum)
Per Pt, Pt is coming from home with complaints of cough and generally not feeling well that worsened yesterday. Denies congestion or productive cough. No fevers.   Also reports sore to the left hip and right hip pain that has continued and is being followed by a MD.

## 2017-06-27 NOTE — Discharge Instructions (Signed)
As discussed, apply cream to affected area once daily for 2 weeks. Keep the area clean and dry. Stay well-hydrated, get some rest and follow-up with your primary care provider. Viral upper respiratory infections may last up to 15 days. Return if symptoms worsen or new concerning symptoms in the meantime.

## 2018-01-05 ENCOUNTER — Emergency Department (HOSPITAL_COMMUNITY)
Admission: EM | Admit: 2018-01-05 | Discharge: 2018-01-06 | Disposition: A | Discharge status: Home / Self Care | Payer: 59 | Attending: Emergency Medicine | Admitting: Emergency Medicine

## 2018-01-05 ENCOUNTER — Other Ambulatory Visit: Payer: Self-pay

## 2018-01-05 ENCOUNTER — Encounter (HOSPITAL_COMMUNITY): Payer: Self-pay | Admitting: Emergency Medicine

## 2018-01-05 DIAGNOSIS — E162 Hypoglycemia, unspecified: Secondary | ICD-10-CM

## 2018-01-05 DIAGNOSIS — Z79899 Other long term (current) drug therapy: Secondary | ICD-10-CM | POA: Insufficient documentation

## 2018-01-05 DIAGNOSIS — E11649 Type 2 diabetes mellitus with hypoglycemia without coma: Secondary | ICD-10-CM | POA: Insufficient documentation

## 2018-01-05 DIAGNOSIS — R55 Syncope and collapse: Secondary | ICD-10-CM | POA: Insufficient documentation

## 2018-01-05 DIAGNOSIS — E876 Hypokalemia: Secondary | ICD-10-CM | POA: Insufficient documentation

## 2018-01-05 DIAGNOSIS — Z7982 Long term (current) use of aspirin: Secondary | ICD-10-CM | POA: Insufficient documentation

## 2018-01-05 DIAGNOSIS — E039 Hypothyroidism, unspecified: Secondary | ICD-10-CM | POA: Diagnosis not present

## 2018-01-05 DIAGNOSIS — Z794 Long term (current) use of insulin: Secondary | ICD-10-CM | POA: Diagnosis not present

## 2018-01-05 DIAGNOSIS — I10 Essential (primary) hypertension: Secondary | ICD-10-CM | POA: Insufficient documentation

## 2018-01-05 LAB — CBG MONITORING, ED: Glucose-Capillary: 160 mg/dL — ABNORMAL HIGH (ref 65–99)

## 2018-01-05 LAB — BASIC METABOLIC PANEL
ANION GAP: 12 (ref 5–15)
BUN: 10 mg/dL (ref 6–20)
CHLORIDE: 101 mmol/L (ref 101–111)
CO2: 25 mmol/L (ref 22–32)
Calcium: 10.5 mg/dL — ABNORMAL HIGH (ref 8.9–10.3)
Creatinine, Ser: 0.78 mg/dL (ref 0.44–1.00)
GFR calc Af Amer: >60 mL/min (ref >60)
GFR calc non Af Amer: >60 mL/min (ref >60)
GLUCOSE: 143 mg/dL — AB (ref 65–99)
POTASSIUM: 2.8 mmol/L — AB (ref 3.5–5.1)
Sodium: 138 mmol/L (ref 135–145)

## 2018-01-05 LAB — CBC
HEMATOCRIT: 46.4 % — AB (ref 36.0–46.0)
HEMOGLOBIN: 14.1 g/dL (ref 12.0–15.0)
MCH: 19.7 pg — AB (ref 26.0–34.0)
MCHC: 30.4 g/dL (ref 30.0–36.0)
MCV: 65 fL — ABNORMAL LOW (ref 78.0–100.0)
Platelets: 427 10*3/uL — ABNORMAL HIGH (ref 150–400)
RBC: 7.14 MIL/uL — AB (ref 3.87–5.11)
RDW: 19.1 % — ABNORMAL HIGH (ref 11.5–15.5)
WBC: 15.1 10*3/uL — ABNORMAL HIGH (ref 4.0–10.5)

## 2018-01-05 LAB — I-STAT BETA HCG BLOOD, ED (MC, WL, AP ONLY): I-stat hCG, quantitative: <5 m[IU]/mL (ref <5)

## 2018-01-05 MED ORDER — ACETAMINOPHEN 500 MG PO TABS
1000.0000 mg | ORAL_TABLET | Freq: Once | ORAL | Status: AC
  Administered 2018-01-06: 1000 mg via ORAL
  Filled 2018-01-05: qty 2

## 2018-01-05 MED ORDER — SODIUM CHLORIDE 0.9 % IV BOLUS
1000.0000 mL | Freq: Once | INTRAVENOUS | Status: AC
  Administered 2018-01-06: 1000 mL via INTRAVENOUS

## 2018-01-05 NOTE — ED Provider Notes (Signed)
[facility] EMERGENCY DEPARTMENT Provider Note  CSN: [id number] Arrival date & time: 01/05/18 1705  Chief Complaint(s) Loss of Consciousness  HPI Morgan [name] is a 52 y.o. female   The history is provided by the patient.  Loss of Consciousness   This is a recurrent problem. Episode onset: 18 hrs ago. Episode frequency: once. The problem has been resolved. Length of episode of loss of consciousness: approx 1 hr. Associated with: taking a shower. She also noted blood sugar of 50 prior to getting in the shower; states that she ate something. Associated symptoms include focal weakness (LUE), headaches and light-headedness. Pertinent negatives include bladder incontinence, chest pain, confusion, congestion, diaphoresis, fever, focal sensory loss, malaise/fatigue, nausea, palpitations, vertigo, visual change, vomiting and weakness. Her past medical history is significant for CVA. Past medical history comments: DM on insulin pump.    Past Medical History Past Medical History:  Diagnosis Date  . Diabetes mellitus without complication (HCC)    Type 2  . Fibroid   . Hyperlipidemia   . Hypertension   . Hypothyroid   . 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 Thalassemia minor   . Vitamin D deficiency    Patient Active Problem List   Diagnosis Date Noted  . Chest pain, rule out acute myocardial infarction 05/14/2016  . Chest pain 05/14/2016  . Hyperlipidemia 05/14/2016  . Hypothyroidism 05/14/2016  . Essential hypertension 05/14/2016   Home Medication(s) Prior to Admission medications   Medication Sig Start Date End Date Taking? Authorizing Provider  aspirin 81 MG tablet Take 81 mg by mouth every morning.    Yes [provider]  Cholecalciferol (VITAMIN D) 2000 units CAPS Take 2,000-4,000 Units by mouth See admin instructions. 4,000units daily on Mon/Wed/Fri and 2,000units daily on all other days   Yes [provider]  Co-Enzyme Q-10 30 MG CAPS Take  30 mg by mouth 3 (three) times daily.   Yes [provider]  FLUoxetine (PROZAC) 20 MG tablet Take 60 mg by mouth every morning.    Yes [provider]  insulin lispro (HUMALOG) 100 UNIT/ML injection Inject 200 Units into the skin daily. Using in pump 09/06/17  Yes [provider]  levothyroxine (SYNTHROID, LEVOTHROID) 150 MCG tablet Take 150 mcg by mouth daily.    Yes [provider]  losartan (COZAAR) 100 MG tablet Take 100 mg by mouth daily.   Yes [provider]  Multiple Vitamin (MULTIVITAMIN WITH MINERALS) TABS tablet Take 1 tablet by mouth daily.   Yes [provider]  pantoprazole (PROTONIX) 20 MG tablet Take 20 mg by mouth every morning.   Yes [provider]  potassium chloride (K-DUR) 10 MEQ tablet Take 1 tablet (10 mEq total) by mouth daily for 14 days. 01/06/18 01/20/18  Fatima Blank, MD  traMADol (ULTRAM) 50 MG tablet Take 1 tablet (50 mg total) by mouth every 6 (six) hours as needed. Patient not taking: Reported on 01/05/2018 12/15/16   Robyn Haber, MD  Past Surgical History Past Surgical History:  Procedure Laterality Date  . CESAREAN SECTION    . CHOLECYSTECTOMY    . KNEE SURGERY    . SUBCLAVIAN STENT PLACEMENT    . TONSILLECTOMY     Family History Family History  Problem Relation Age of Onset  . Diabetes Mother   . Hypertension Mother   . Hyperlipidemia Mother   . CAD Mother   . CAD Father   . Heart failure Father   . Hypertension Sister   . Hyperlipidemia Sister   . Hypertension Brother   . Hyperlipidemia Brother     Social History Social History   Tobacco Use  . Smoking status: Former Smoker    Types: Cigarettes    Last attempt to quit: 05/14/2014    Years since quitting: 3.6  . Smokeless tobacco: Never Used  Substance Use Topics  . Alcohol use: No  .  Drug use: No   Allergies Ilosone [erythromycin]; Keflex [cephalexin]; Penicillins; Iron; Metformin and related; and Morphine and related  Review of Systems Review of Systems  Constitutional: Negative for diaphoresis, fever and malaise/fatigue.  HENT: Negative for congestion.   Cardiovascular: Positive for syncope. Negative for chest pain and palpitations.  Gastrointestinal: Negative for nausea and vomiting.  Genitourinary: Negative for bladder incontinence.  Neurological: Positive for focal weakness (LUE), light-headedness and headaches. Negative for vertigo and weakness.  Psychiatric/Behavioral: Negative for confusion.   All other systems are reviewed and are negative for acute change except as noted in the HPI  Physical Exam Vital Signs  I have reviewed the triage vital signs BP 120/71 (BP Location: Right Arm)   Pulse (!) 106   Temp 98.2 F (36.8 C)   Resp 16   Ht 5\' 2"  (1.575 m)   Wt 97.5 kg (215 lb)   LMP 08/03/2013   SpO2 99%   BMI 39.32 kg/m   Physical Exam  Constitutional: She is oriented to person, place, and time. She appears well-developed and well-nourished. No distress.  HENT:  Head: Normocephalic and atraumatic.  Nose: Nose normal.  Eyes: Pupils are equal, round, and reactive to light. Conjunctivae and EOM are normal. Right eye exhibits no discharge. Left eye exhibits no discharge. No scleral icterus.  Neck: Normal range of motion. Neck supple.  Cardiovascular: Normal rate and regular rhythm. Exam reveals no gallop and no friction rub.  No murmur heard. Pulmonary/Chest: Effort normal and breath sounds normal. No stridor. No respiratory distress. She has no rales.  Abdominal: Soft. She exhibits no distension. There is no tenderness.  Musculoskeletal: She exhibits no edema or tenderness.  Neurological: She is alert and oriented to person, place, and time.  Mental Status:  Alert and oriented to person, place, and time.  Attention and concentration normal.    Speech clear.  Recent memory is intact  Cranial Nerves:  II Visual Fields: Intact to confrontation. Visual fields intact. III, IV, VI: Pupils equal and reactive to light and near. Full eye movement without nystagmus  V Facial Sensation: Normal. No weakness of masticatory muscles  VII: No facial weakness or asymmetry  VIII Auditory Acuity: Grossly normal  IX/X: The uvula is midline; the palate elevates symmetrically  XI: Normal sternocleidomastoid and trapezius strength  XII: The tongue is midline. No atrophy or fasciculations.   Motor System: Muscle Strength: 5/5 and symmetric in the upper and lower extremities. No pronation or drift.  Muscle Tone: Tone and muscle bulk are normal in the upper and lower extremities.   Reflexes: DTRs:  1+ and symmetrical in all four extremities. No Clonus Coordination: Intact finger-to-nose, heel-to-shin. No tremor.  Sensation: Intact to light [name], and pinprick.  Gait: ataxic   Skin: Skin is warm and dry. No rash noted. She is not diaphoretic. No erythema.  Psychiatric: She has a normal mood and affect.  Vitals reviewed.   ED Results and Treatments Labs (all labs ordered are listed, but only abnormal results are displayed) Labs Reviewed  BASIC METABOLIC PANEL - Abnormal; Notable for the following components:      Result Value   Potassium 2.8 (*)    Glucose, Bld 143 (*)    Calcium 10.5 (*)    All other components within normal limits  CBC - Abnormal; Notable for the following components:   WBC 15.1 (*)    RBC 7.14 (*)    HCT 46.4 (*)    MCV 65.0 (*)    MCH 19.7 (*)    RDW 19.1 (*)    Platelets 427 (*)    All other components within normal limits  CBG MONITORING, ED - Abnormal; Notable for the following components:   Glucose-Capillary 160 (*)    All other components within normal limits  CBG MONITORING, ED - Abnormal; Notable for the following components:   Glucose-Capillary 109 (*)    All other components within normal limits  CBG  MONITORING, ED - Abnormal; Notable for the following components:   Glucose-Capillary 54 (*)    All other components within normal limits  CBG MONITORING, ED - Abnormal; Notable for the following components:   Glucose-Capillary 104 (*)    All other components within normal limits  I-STAT BETA HCG BLOOD, ED ([facility], [facility], [facility] ONLY)  CBG MONITORING, ED  CBG MONITORING, ED                                                                                                                         EKG  EKG Interpretation  Date/Time:  Thursday January 05 2018 18:51:31 EDT Ventricular Rate:  107 PR Interval:  130 QRS Duration: 84 QT Interval:  380 QTC Calculation: 507 R Axis:   55 Text Interpretation:  Sinus tachycardia Otherwise normal ECG No significant change since last tracing Confirmed by Addison Lank [phone]) on 01/05/2018 11:24:43 PM      Radiology Mr Brain W And Wo Contrast  Result Date: 01/06/2018 CLINICAL DATA:  Focal neuro deficit, > 6 hrs, stroke suspected EXAM: MRI HEAD WITHOUT AND WITH CONTRAST TECHNIQUE: Multiplanar, multiecho pulse sequences of the brain and surrounding structures were obtained without and with intravenous contrast. CONTRAST:  81mL MULTIHANCE GADOBENATE DIMEGLUMINE 529 MG/ML IV SOLN COMPARISON:  05/15/2016 brain MRI 10/10/2017 head CT FINDINGS: BRAIN: The midline structures are normal. There is no acute infarct, acute hemorrhage or mass effect. The white matter signal is normal for the patient's age. There is an old left cerebellar infarct and an old right corona radiata lacunar infarct. The CSF spaces are normal for age, with no hydrocephalus. Blood-sensitive  sequences show no chronic microhemorrhage or superficial siderosis. No abnormal contrast enhancement. VASCULAR: Major intracranial arterial and venous sinus flow voids are preserved. SKULL AND UPPER CERVICAL SPINE: The visualized skull base, calvarium, upper cervical spine and extracranial soft tissues are normal.  SINUSES/ORBITS: Inferior right maxillary retention cyst. No mastoid effusion. The orbits are normal. 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 No acute abnormality. Electronically Signed   By: Ulyses Jarred M.D.   On: 01/06/2018 01:38   Pertinent labs & imaging results that were available during my care of the patient were reviewed by me and considered in my medical decision making (see chart for details).  Medications Ordered in ED Medications  sodium chloride 0.9 % bolus 1,000 mL (0 mLs Intravenous Stopped 01/06/18 0253)  acetaminophen (TYLENOL) tablet 1,000 mg (1,000 mg Oral Given 01/06/18 0008)  gadobenate dimeglumine (MULTIHANCE) injection 20 mL (20 mLs Intravenous Contrast Given 01/06/18 0102)  potassium chloride SA (K-DUR,KLOR-CON) CR tablet 40 mEq (40 mEq Oral Given 01/06/18 0212)  potassium chloride 10 mEq in 100 mL IVPB (0 mEq Intravenous Stopped 01/06/18 0259)  ibuprofen (ADVIL,MOTRIN) tablet 600 mg (600 mg Oral Given 01/06/18 0256)  prochlorperazine (COMPAZINE) injection 10 mg (10 mg Intravenous Given 01/06/18 0334)  dexamethasone (DECADRON) injection 10 mg (10 mg Intravenous Given 01/06/18 0335)  diphenhydrAMINE (BENADRYL) injection 25 mg (25 mg Intravenous Given 01/06/18 0335)                                                                                                                                    Procedures Procedures  (including critical care time)  Medical Decision Making / ED Course I have reviewed the nursing notes for this encounter and the patient's prior records (if available in EHR or on provided paperwork).    Syncope likely hypoglycemia. EKG with sinus tach; otherwise without acute ischemic changes, dysrhythmias, blocks.  Doubt cardiac etiology.  Doubt PE.  Labs with leukocytosis likely demargination.  Hemoglobin stable.  BMP with mild hypokalemia which was repleted orally and through IV.  Glucose of 143 which was right after she ate  in the waiting room.  Orthostatics reassuring.  Given patient's history of CVA and ataxia, MRI was repeated which revealed old strokes without acute process.  No ICH.  On repeat CBG patient had recurrent hypoglycemia.  Her pump was removed.  She was given oral nutrition and monitored for several hours with repeated CBGs that were stable x3.  Recommended discontinuing her insulin pump until she contacts her primary care provider to adjust dosing.  Patient also with typical migrainous headache given migraine cocktail. Significant improvement after.  Improved gait.  The patient appears reasonably screened and/or stabilized for discharge and I doubt any other medical condition or other [facility] requiring further screening, evaluation, or treatment in the ED at this time prior to discharge.  The patient is safe for discharge with strict return precautions.  Final Clinical Impression(s) / ED Diagnoses Final diagnoses:  Hypoglycemia  Syncope and collapse  Hypokalemia   Disposition: Discharge  Condition: Good  I have discussed the results, Dx and Tx plan with the patient who expressed understanding and agree(s) with the plan. Discharge instructions discussed at great length. The patient was given strict return precautions who verbalized understanding of the instructions. No further questions at time of discharge.    ED Discharge Orders        Ordered    potassium chloride (K-DUR) 10 MEQ tablet  Daily     01/06/18 0713       Follow Up: [address] [place] [address] [phone]  Call today       This chart was dictated using voice recognition software.  Despite best efforts to proofread,  errors can occur which can change the documentation meaning.   Fatima Blank, MD 01/06/18 [phone]

## 2018-01-05 NOTE — ED Triage Notes (Addendum)
Pt had syncopal episode today while in the shower; unsure if she hit her head. Pt has been feeling lightheaded/dizzy for two weeks. Pt having some nausea. Pt has had highs/lows with CBG over the past two weeks as well. CBG ranges from 43-200's.

## 2018-01-06 ENCOUNTER — Emergency Department (HOSPITAL_COMMUNITY): Payer: 59

## 2018-01-06 ENCOUNTER — Encounter (HOSPITAL_COMMUNITY): Payer: Self-pay | Admitting: *Deleted

## 2018-01-06 LAB — CBG MONITORING, ED
GLUCOSE-CAPILLARY: 109 mg/dL — AB (ref 65–99)
GLUCOSE-CAPILLARY: 93 mg/dL (ref 65–99)
GLUCOSE-CAPILLARY: 97 mg/dL (ref 65–99)
GLUCOSE-CAPILLARY: 104 mg/dL — AB (ref 65–99)
Glucose-Capillary: 54 mg/dL — ABNORMAL LOW (ref 65–99)

## 2018-01-06 MED ORDER — POTASSIUM CHLORIDE CRYS ER 20 MEQ PO TBCR
40.0000 meq | EXTENDED_RELEASE_TABLET | Freq: Once | ORAL | Status: AC
  Administered 2018-01-06: 40 meq via ORAL
  Filled 2018-01-06: qty 2

## 2018-01-06 MED ORDER — GADOBENATE DIMEGLUMINE 529 MG/ML IV SOLN
20.0000 mL | Freq: Once | INTRAVENOUS | Status: AC | PRN
  Administered 2018-01-06: 20 mL via INTRAVENOUS

## 2018-01-06 MED ORDER — PROCHLORPERAZINE EDISYLATE 10 MG/2ML IJ SOLN
10.0000 mg | Freq: Once | INTRAMUSCULAR | Status: AC
  Administered 2018-01-06: 10 mg via INTRAVENOUS
  Filled 2018-01-06: qty 2

## 2018-01-06 MED ORDER — POTASSIUM CHLORIDE 10 MEQ/100ML IV SOLN
10.0000 meq | Freq: Once | INTRAVENOUS | Status: AC
  Administered 2018-01-06: 10 meq via INTRAVENOUS
  Filled 2018-01-06: qty 100

## 2018-01-06 MED ORDER — DEXAMETHASONE SODIUM PHOSPHATE 10 MG/ML IJ SOLN
10.0000 mg | Freq: Once | INTRAMUSCULAR | Status: AC
  Administered 2018-01-06: 10 mg via INTRAVENOUS
  Filled 2018-01-06: qty 1

## 2018-01-06 MED ORDER — DIPHENHYDRAMINE HCL 50 MG/ML IJ SOLN
25.0000 mg | Freq: Once | INTRAMUSCULAR | Status: AC
  Administered 2018-01-06: 25 mg via INTRAVENOUS
  Filled 2018-01-06: qty 1

## 2018-01-06 MED ORDER — POTASSIUM CHLORIDE ER 10 MEQ PO TBCR: 10.0000 meq | EXTENDED_RELEASE_TABLET | Freq: Every day | ORAL | 0 refills | Status: DC

## 2018-01-06 MED ORDER — IBUPROFEN 400 MG PO TABS
600.0000 mg | ORAL_TABLET | Freq: Once | ORAL | Status: AC
  Administered 2018-01-06: 600 mg via ORAL
  Filled 2018-01-06: qty 1

## 2018-01-06 NOTE — Discharge Instructions (Signed)
Follow-up closely with your primary care provider regarding parameters on your insulin pump.  Until then discontinue the use of your pump.

## 2018-01-06 NOTE — ED Notes (Signed)
Patient transported to MRI 

## 2018-01-06 NOTE — ED Notes (Addendum)
Dr. Leonette Monarch ( EDP) explained plan of care to pt. , pt. given [place] sandwich and orange juice .

## 2018-04-30 ENCOUNTER — Ambulatory Visit (INDEPENDENT_AMBULATORY_CARE_PROVIDER_SITE_OTHER)
Admission: EM | Admit: 2018-04-30 | Discharge: 2018-04-30 | Disposition: A | Discharge status: Home / Self Care | Payer: 59 | Source: Home / Self Care | Attending: Internal Medicine | Admitting: Internal Medicine

## 2018-04-30 ENCOUNTER — Other Ambulatory Visit: Payer: Self-pay

## 2018-04-30 ENCOUNTER — Emergency Department (HOSPITAL_COMMUNITY)
Admission: EM | Admit: 2018-04-30 | Discharge: 2018-04-30 | Disposition: A | Discharge status: Home / Self Care | Payer: 59 | Attending: Emergency Medicine | Admitting: Emergency Medicine

## 2018-04-30 ENCOUNTER — Encounter (HOSPITAL_COMMUNITY): Payer: Self-pay

## 2018-04-30 ENCOUNTER — Encounter (HOSPITAL_COMMUNITY): Payer: Self-pay | Admitting: *Deleted

## 2018-04-30 DIAGNOSIS — Z7982 Long term (current) use of aspirin: Secondary | ICD-10-CM | POA: Insufficient documentation

## 2018-04-30 DIAGNOSIS — E785 Hyperlipidemia, unspecified: Secondary | ICD-10-CM | POA: Diagnosis not present

## 2018-04-30 DIAGNOSIS — G43909 Migraine, unspecified, not intractable, without status migrainosus: Secondary | ICD-10-CM | POA: Diagnosis not present

## 2018-04-30 DIAGNOSIS — E039 Hypothyroidism, unspecified: Secondary | ICD-10-CM | POA: Diagnosis not present

## 2018-04-30 DIAGNOSIS — R0602 Shortness of breath: Secondary | ICD-10-CM

## 2018-04-30 DIAGNOSIS — R112 Nausea with vomiting, unspecified: Secondary | ICD-10-CM

## 2018-04-30 DIAGNOSIS — E1165 Type 2 diabetes mellitus with hyperglycemia: Secondary | ICD-10-CM | POA: Diagnosis not present

## 2018-04-30 DIAGNOSIS — Z87891 Personal history of nicotine dependence: Secondary | ICD-10-CM | POA: Insufficient documentation

## 2018-04-30 DIAGNOSIS — R51 Headache: Secondary | ICD-10-CM | POA: Diagnosis present

## 2018-04-30 DIAGNOSIS — Z794 Long term (current) use of insulin: Secondary | ICD-10-CM | POA: Insufficient documentation

## 2018-04-30 DIAGNOSIS — Z79899 Other long term (current) drug therapy: Secondary | ICD-10-CM | POA: Insufficient documentation

## 2018-04-30 DIAGNOSIS — E119 Type 2 diabetes mellitus without complications: Secondary | ICD-10-CM | POA: Diagnosis not present

## 2018-04-30 DIAGNOSIS — I10 Essential (primary) hypertension: Secondary | ICD-10-CM | POA: Insufficient documentation

## 2018-04-30 DIAGNOSIS — R197 Diarrhea, unspecified: Secondary | ICD-10-CM | POA: Diagnosis not present

## 2018-04-30 LAB — POCT I-STAT, CHEM 8
BUN: 18 mg/dL (ref 6–20)
Calcium, Ion: 0.86 mmol/L — CL (ref 1.15–1.40)
Chloride: 106 mmol/L (ref 98–111)
Creatinine, Ser: 0.7 mg/dL (ref 0.44–1.00)
Glucose, Bld: 238 mg/dL — ABNORMAL HIGH (ref 70–99)
HCT: 48 % — ABNORMAL HIGH (ref 36.0–46.0)
Hemoglobin: 16.3 g/dL — ABNORMAL HIGH (ref 12.0–15.0)
POTASSIUM: 3.8 mmol/L (ref 3.5–5.1)
Sodium: 135 mmol/L (ref 135–145)
TCO2: 19 mmol/L — ABNORMAL LOW (ref 22–32)

## 2018-04-30 LAB — COMPREHENSIVE METABOLIC PANEL
ALT: 50 U/L — ABNORMAL HIGH (ref 0–44)
ANION GAP: 11 (ref 5–15)
AST: 46 U/L — ABNORMAL HIGH (ref 15–41)
Albumin: 3.9 g/dL (ref 3.5–5.0)
Alkaline Phosphatase: 70 U/L (ref 38–126)
BUN: 15 mg/dL (ref 6–20)
CO2: 23 mmol/L (ref 22–32)
Calcium: 9.5 mg/dL (ref 8.9–10.3)
Chloride: 104 mmol/L (ref 98–111)
Creatinine, Ser: 0.85 mg/dL (ref 0.44–1.00)
GFR calc non Af Amer: >60 mL/min (ref >60)
Glucose, Bld: 153 mg/dL — ABNORMAL HIGH (ref 70–99)
POTASSIUM: 3.5 mmol/L (ref 3.5–5.1)
Sodium: 138 mmol/L (ref 135–145)
TOTAL PROTEIN: 7.1 g/dL (ref 6.5–8.1)
Total Bilirubin: 1 mg/dL (ref 0.3–1.2)

## 2018-04-30 LAB — CBC
HEMATOCRIT: 44.1 % (ref 36.0–46.0)
HEMOGLOBIN: 13.5 g/dL (ref 12.0–15.0)
MCH: 20.7 pg — ABNORMAL LOW (ref 26.0–34.0)
MCHC: 30.6 g/dL (ref 30.0–36.0)
MCV: 67.7 fL — AB (ref 78.0–100.0)
Platelets: 391 10*3/uL (ref 150–400)
RBC: 6.51 MIL/uL — AB (ref 3.87–5.11)
RDW: 17.2 % — ABNORMAL HIGH (ref 11.5–15.5)
WBC: 16 10*3/uL — ABNORMAL HIGH (ref 4.0–10.5)

## 2018-04-30 LAB — LIPASE, BLOOD: Lipase: 23 U/L (ref 11–51)

## 2018-04-30 MED ORDER — ONDANSETRON HCL 4 MG PO TABS
4.0000 mg | ORAL_TABLET | Freq: Once | ORAL | Status: AC
  Administered 2018-04-30: 4 mg via ORAL
  Filled 2018-04-30: qty 1

## 2018-04-30 MED ORDER — SODIUM CHLORIDE 0.9 % IV BOLUS
1000.0000 mL | Freq: Once | INTRAVENOUS | Status: AC
  Administered 2018-04-30: 1000 mL via INTRAVENOUS

## 2018-04-30 MED ORDER — PROCHLORPERAZINE MALEATE 5 MG PO TABS
10.0000 mg | ORAL_TABLET | Freq: Once | ORAL | Status: AC
  Administered 2018-04-30: 10 mg via ORAL
  Filled 2018-04-30: qty 2

## 2018-04-30 MED ORDER — MAGNESIUM SULFATE 2 GM/50ML IV SOLN
2.0000 g | Freq: Once | INTRAVENOUS | Status: AC
  Administered 2018-04-30: 2 g via INTRAVENOUS
  Filled 2018-04-30: qty 50

## 2018-04-30 MED ORDER — DIPHENHYDRAMINE HCL 25 MG PO CAPS
25.0000 mg | ORAL_CAPSULE | Freq: Once | ORAL | Status: AC
  Administered 2018-04-30: 25 mg via ORAL
  Filled 2018-04-30: qty 1

## 2018-04-30 NOTE — ED Provider Notes (Signed)
[place]   [id number] 04/30/18 Arrival Time: 1054  CC: Nausea, vomiting, and diarrhea  SUBJECTIVE:  Morgan [name] is a 52 y.o. female hx is significant for DM, HLD, HTN, hypothyroid, vitamin D deficiency, who presents with complaint of abrupt onset of vomiting x 3 epidsodes for that began 1 days ago and diarrhea x 10+ episodes that began 2 days ago.  Denies a precipitating event, or trauma.  Denies recent travel or antibiotic use.  Denies abdominal pain.  Has tried OTC medications including antidiarrheals and protonix with minimal relief.  Symptoms are made worse with laying down at night.  Reports similar symptoms in the past.  Last BM this morning with "explosive liquids."  Complains of associated decreased appetite, persistent heart burn, and SOB.  Denies fever, chills, weight changes, chest pain, constipation, hematochezia, melena, dysuria, difficulty urinating, increased frequency or urgency, flank pain, loss of bowel or bladder function.  Patient's last menstrual period was 08/03/2013.  ROS: As per HPI.  Past Medical History:  Diagnosis Date  . Diabetes mellitus without complication (HCC)    Type 2  . Fibroid   . Hyperlipidemia   . Hypertension   . Hypothyroid   . 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 Thalassemia minor   . Vitamin D deficiency    Past Surgical History:  Procedure Laterality Date  . ABDOMINAL HYSTERECTOMY    . CESAREAN SECTION    . CHOLECYSTECTOMY    . KNEE SURGERY    . SUBCLAVIAN STENT PLACEMENT    . TONSILLECTOMY     Allergies  Allergen Reactions  . Ilosone [Erythromycin] Anaphylaxis  . Keflex [Cephalexin] Anaphylaxis  . Penicillins Anaphylaxis    Has patient had a PCN reaction causing immediate rash, facial/tongue/throat swelling, SOB or lightheadedness with hypotension: YES Has patient had a PCN reaction causing severe rash involving mucus membranes or skin necrosis: NO Has patient had a PCN reaction that required  hospitalizationNO Has patient had a PCN reaction occurring within the last 10 years: NO If all of the above answers are "NO", then may proceed with Cephalosporin use.  . Iron Other (See Comments)    Pt reports condition limiting Iron intake.  . Metformin And Related Other (See Comments)    Pt prefers to not take this medication  . Morphine And Related Other (See Comments)    Pt reports hallucinations   No current facility-administered medications on file prior to encounter.    Current Outpatient Medications on File Prior to Encounter  Medication Sig Dispense Refill  . aspirin 81 MG tablet Take 81 mg by mouth every morning.     . Cholecalciferol (VITAMIN D) 2000 units CAPS Take 2,000-4,000 Units by mouth See admin instructions. 4,000units daily on Mon/Wed/Fri and 2,000units daily on all other days    . Co-Enzyme Q-10 30 MG CAPS Take 30 mg by mouth 3 (three) times daily.    [name] doxepin (SINEQUAN) 25 MG capsule Take 50 mg by mouth.    [name] FLUoxetine (PROZAC) 20 MG tablet Take 40 mg by mouth every morning.     . insulin lispro (HUMALOG) 100 UNIT/ML injection Inject 200 Units into the skin daily. Using in pump    . levothyroxine (SYNTHROID, LEVOTHROID) 150 MCG tablet Take 150 mcg by mouth daily.     [name] losartan (COZAAR) 100 MG tablet Take 100 mg by mouth daily.    . Multiple Vitamin (MULTIVITAMIN WITH MINERALS) TABS tablet Take 1 tablet by mouth daily.    [name]  pantoprazole (PROTONIX) 20 MG tablet Take 20 mg by mouth every morning.    [name] QUEtiapine (SEROQUEL) 50 MG tablet Take 150 mg by mouth at bedtime.    . Semaglutide (OZEMPIC, 0.25 OR 0.5 MG/DOSE, [place]) Inject into the skin.    . potassium chloride (K-DUR) 10 MEQ tablet Take 1 tablet (10 mEq total) by mouth daily for 14 days. 14 tablet 0   Social History   Socioeconomic History  . Marital status: Divorced    Spouse name: Not on file  . Number of children: Not on file  . Years of education: Not on file  . Highest education level: Not on file    Occupational History  . Not on file  Social Needs  . Financial resource strain: Not on file  . Food insecurity:    Worry: Not on file    Inability: Not on file  . Transportation needs:    Medical: Not on file    Non-medical: Not on file  Tobacco Use  . Smoking status: Former Smoker    Types: Cigarettes    Last attempt to quit: 05/14/2014    Years since quitting: 3.9  . Smokeless tobacco: Never Used  Substance and Sexual Activity  . Alcohol use: No  . Drug use: No  . Sexual activity: Not on file  Lifestyle  . Physical activity:    Days per week: Not on file    Minutes per session: Not on file  . Stress: Not on file  Relationships  . Social connections:    Talks on phone: Not on file    Gets together: Not on file    Attends religious service: Not on file    Active member of club or organization: Not on file    Attends meetings of clubs or organizations: Not on file    Relationship status: Not on file  . Intimate partner violence:    Fear of current or ex partner: Not on file    Emotionally abused: Not on file    Physically abused: Not on file    Forced sexual activity: Not on file  Other Topics Concern  . Not on file  Social History Narrative  . Not on file   Family History  Problem Relation Age of Onset  . Diabetes Mother   . Hypertension Mother   . Hyperlipidemia Mother   . CAD Mother   . CAD Father   . Heart failure Father   . Hypertension Sister   . Hyperlipidemia Sister   . Hypertension Brother   . Hyperlipidemia Brother    OBJECTIVE:  Vitals:   04/30/18 1140  BP: 135/74  Pulse: (!) 109  Resp: 18  Temp: 98.4 F (36.9 C)  TempSrc: Oral  SpO2: 98%    General appearance: AO; appears uncomfortable; appears older than stated age HEENT: NCAT.  Oropharynx clear.  Lungs: clear to auscultation bilaterally without adventitious breath sounds Heart: Murmur present.  Radial pulses 2+ symmetrical bilaterally Abdomen: soft, non-distended; hyperactive  bowel sounds; diffuse abdominal tenderness; minimal guarding Back: no CVA tenderness Extremities: no edema; symmetrical with no gross deformities Skin: warm and dry Neurologic: normal gait Psychological: alert and cooperative; normal mood and affect  Labs: Results for orders placed or performed during the hospital encounter of 04/30/18 (from the past 24 hour(s))  I-STAT, chem 8     Status: Abnormal   Collection Time: 04/30/18 12:37 PM  Result Value Ref Range   Sodium 135 135 - 145 mmol/L  Potassium 3.8 3.5 - 5.1 mmol/L   Chloride 106 98 - 111 mmol/L   BUN 18 6 - 20 mg/dL   Creatinine, Ser 0.70 0.44 - 1.00 mg/dL   Glucose, Bld 238 (H) 70 - 99 mg/dL   Calcium, Ion 0.86 (LL) 1.15 - 1.40 mmol/L   TCO2 19 (L) 22 - 32 mmol/L   Hemoglobin 16.3 (H) 12.0 - 15.0 g/dL   HCT 48.0 (H) 36.0 - 46.0 %   Comment NOTIFIED PHYSICIAN    EKG:  EKG sinus tachycardia rhythm without ST elevations, depressions, or prolonged PR interval.  No narrowing or widening of the QRS complexes.  Comparable to past EKG.    ASSESSMENT & PLAN:  1. Nausea vomiting and diarrhea   2. Hypocalcemia   3. Type 2 diabetes mellitus with hyperglycemia, without long-term current use of insulin (HCC)     No orders of the defined types were placed in this encounter.  Recommending further evaluation and management in ED for persistent nausea, vomiting and diarrhea with critically low calcium of 0.86 Patient aware and in agreement with this plan.   Reviewed expectations re: course of current medical issues. Questions answered. Outlined signs and symptoms indicating need for more acute intervention. Patient verbalized understanding. After Visit Summary given.   Lestine Box, PA-C 04/30/18 1301

## 2018-04-30 NOTE — ED Notes (Signed)
Patient verbalizes understanding of discharge instructions. Opportunity for questioning and answers were provided. Armband removed by staff, pt discharged from ED ambulatory.   

## 2018-04-30 NOTE — ED Provider Notes (Signed)
[facility] EMERGENCY DEPARTMENT Provider Note   CSN: [id number] Arrival date & time: 04/30/18  1308   History   Chief Complaint Chief Complaint  Patient presents with  . Abdominal Pain  . Headache    HPI LADA FULBRIGHT is a 52 y.o. female.  HPI Patient with 3 days of migraine-like headaches.  There is tension bilaterally in her head and she can feel when they are going to come on, she has nausea and photophobia with them when they get "to their worst".  She says she occasionally has had neurological symptoms with these in the past and people thought she had a stroke but it was just the headache.  She denies current neurological symptoms but does point out that she has a history of stroke and brachial stent.  She has had stress lately but this roundof headaches started before a stressful family event.  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    She also complains of ~24hrs of n/v.  She has vomtied 4x so far without blood, 3 of which she has been laying down and one reclining.   She does ask if her GERD could be contributory.  She has not been able to eat during that time but states that she has been able to sip fluids.    Past Medical History:  Diagnosis Date  . Diabetes mellitus without complication (HCC)    Type 2  . Fibroid   . Hyperlipidemia   . Hypertension   . Hypothyroid   . 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 Thalassemia minor   . Vitamin D deficiency     Patient Active Problem List   Diagnosis Date Noted  . Chest pain, rule out acute myocardial infarction 05/14/2016  . Chest pain 05/14/2016  . Hyperlipidemia 05/14/2016  . Hypothyroidism 05/14/2016  . Essential hypertension 05/14/2016    Past Surgical History:  Procedure Laterality Date  . ABDOMINAL HYSTERECTOMY    . CESAREAN SECTION    . CHOLECYSTECTOMY    . KNEE SURGERY      . SUBCLAVIAN STENT PLACEMENT    . TONSILLECTOMY       OB History    Gravida  6   Para  2   Term  2   Preterm      AB  4   Living  2     SAB  4   TAB      Ectopic      Multiple      Live Births               Home Medications    Prior to Admission medications   Medication Sig Start Date End Date Taking? Authorizing Provider  aspirin 81 MG tablet Take 81 mg by mouth every morning.    Yes [provider]  Aspirin-Salicylamide-Caffeine (BC HEADACHE POWDER PO) Take 1 Package by mouth every 6 (six) hours as needed (dental pain).   Yes [provider]  Cholecalciferol (VITAMIN D) 2000 units CAPS Take 2,000-4,000 Units by mouth See admin instructions. 4,000units daily on Mon/Wed/Fri and 2,000units daily on all other days   Yes [provider]  Co-Enzyme Q-10 30 MG CAPS Take 30 mg by mouth daily.    Yes [provider]  doxepin (SINEQUAN) 25 MG capsule Take 50 mg by mouth at bedtime.  Yes [provider]  EPINEPHrine 0.3 mg/0.3 mL IJ SOAJ injection Inject 0.3 mg into the muscle once as needed. 02/08/18  Yes [provider]  FLUoxetine (PROZAC) 20 MG tablet Take 40 mg by mouth every morning.    Yes [provider]  GLUCAGON EMERGENCY 1 MG injection Inject 1 mg into the vein once as needed. USE AT ONSET OF HYPOGLYCEMIA 02/09/18  Yes [provider]  insulin lispro (HUMALOG) 100 UNIT/ML injection Inject 200 Units into the skin daily. Using in pump 09/06/17  Yes [provider]  levothyroxine (SYNTHROID, LEVOTHROID) 150 MCG tablet Take 150 mcg by mouth daily.    Yes [provider]  losartan (COZAAR) 100 MG tablet Take 100 mg by mouth daily.   Yes [provider]  Multiple Vitamin (MULTIVITAMIN WITH MINERALS) TABS tablet Take 1 tablet by mouth daily.   Yes [provider]  naproxen sodium (ALEVE) 220 MG tablet Take 440 mg by mouth 2 (two) times daily as needed (PAIN).   Yes  [provider]  pantoprazole (PROTONIX) 20 MG tablet Take 40 mg by mouth every morning.    Yes [provider]  QUEtiapine (SEROQUEL) 50 MG tablet Take 150 mg by mouth at bedtime.   Yes [provider]  Semaglutide (OZEMPIC, 0.25 OR 0.5 MG/DOSE, [place]) Inject 0.5 mg into the skin every Monday.    Yes [provider]  potassium chloride (K-DUR) 10 MEQ tablet Take 1 tablet (10 mEq total) by mouth daily for 14 days. 01/06/18 01/20/18  Fatima Blank, MD    Family History Family History  Problem Relation Age of Onset  . Diabetes Mother   . Hypertension Mother   . Hyperlipidemia Mother   . CAD Mother   . CAD Father   . Heart failure Father   . Hypertension Sister   . Hyperlipidemia Sister   . Hypertension Brother   . Hyperlipidemia Brother     Social History Social History   Tobacco Use  . Smoking status: Former Smoker    Types: Cigarettes    Last attempt to quit: 05/14/2014    Years since quitting: 3.9  . Smokeless tobacco: Never Used  Substance Use Topics  . Alcohol use: No  . Drug use: No     Allergies   Ilosone [erythromycin]; Keflex [cephalexin]; Penicillins; Iron; Metformin and related; and Morphine and related   Review of Systems Review of Systems  Constitutional: Positive for appetite change. Negative for chills, diaphoresis, fatigue and fever.  HENT: Negative for congestion, ear pain, sinus pain, sore throat, trouble swallowing and voice change.   Eyes: Positive for photophobia and visual disturbance. Negative for pain.       Intermittent blurriness for years (claims due to diabetes)  Respiratory: Negative for apnea, cough, choking, chest tightness, shortness of breath, wheezing and stridor.   Cardiovascular: Negative for chest pain and leg swelling.  Gastrointestinal: Positive for diarrhea, nausea and vomiting. Negative for abdominal pain, anal bleeding and constipation.  Endocrine: Negative for polyuria.  Genitourinary:  Negative for dysuria and urgency.  Musculoskeletal: Negative for arthralgias and myalgias.  Skin: Negative for color change, pallor, rash and wound.  Neurological: Positive for headaches. Negative for dizziness, tremors, seizures, syncope, facial asymmetry, speech difficulty, weakness, light-headedness and numbness.     Physical Exam Updated Vital Signs BP 122/71 (BP Location: Right Arm)   Pulse 95   Temp 99.2 F (37.3 C) (Oral)   Resp 18   LMP 08/03/2013   SpO2  97%   Physical Exam  Constitutional: She appears well-developed and well-nourished.  Non-toxic appearance. She does not appear ill. No distress.  HENT:  Head: Normocephalic.  Eyes: EOM are normal.  Cardiovascular: Normal rate and regular rhythm.  Murmur heard. Pulmonary/Chest: Effort normal and breath sounds normal. No respiratory distress. She has no wheezes.  Abdominal: Normal appearance. There is no tenderness. There is no rigidity, no guarding and negative Murphy's sign.  Neurological: She is alert.  Skin: Skin is warm and dry. Capillary refill takes less than 2 seconds. No rash noted. She is not diaphoretic.  Psychiatric: She has a normal mood and affect. Her behavior is normal.  Nursing note and vitals reviewed.    ED Treatments / Results  Labs (all labs ordered are listed, but only abnormal results are displayed) Labs Reviewed  COMPREHENSIVE METABOLIC PANEL - Abnormal; Notable for the following components:      Result Value   Glucose, Bld 153 (*)    AST 46 (*)    ALT 50 (*)    All other components within normal limits  CBC - Abnormal; Notable for the following components:   WBC 16.0 (*)    RBC 6.51 (*)    MCV 67.7 (*)    MCH 20.7 (*)    RDW 17.2 (*)    All other components within normal limits  LIPASE, BLOOD    EKG None  Radiology No results found.  Procedures Procedures (including critical care time)  Medications Ordered in ED Medications  prochlorperazine (COMPAZINE) tablet 10 mg (10 mg  Oral Given 04/30/18 1745)  diphenhydrAMINE (BENADRYL) capsule 25 mg (25 mg Oral Given 04/30/18 1745)  sodium chloride 0.9 % bolus 1,000 mL (0 mLs Intravenous Stopped 04/30/18 1901)  ondansetron (ZOFRAN) tablet 4 mg (4 mg Oral Given 04/30/18 1745)  magnesium sulfate IVPB 2 g 50 mL (2 g Intravenous New Bag/Given 04/30/18 1921)     Initial Impression / Assessment and Plan / ED Course  I have reviewed the triage vital signs and the nursing notes.  Pertinent labs & imaging results that were available during my care of the patient were reviewed by me and considered in my medical decision making (see chart for details).   Patient with symptoms and story consistent with migraine and associated nausea.  Neuro exam completely benign.  Will give migraine cocktail, NS bolus, magnesium and allow to rest in darkened room.  On recheck, headache had subsided and patient had tolerated food.  She was requesting to go home with a work note.  We will d/c with return precautions and instructions to followup with pcp this week.  Final Clinical Impressions(s) / ED Diagnoses   Final diagnoses:  Migraine without status migrainosus, not intractable, unspecified migraine type    ED Discharge Orders    None       Sherene Sires, DO 04/30/18 1959    Quintella Reichert, MD 05/04/18 [phone]

## 2018-04-30 NOTE — ED Triage Notes (Signed)
C/O starting with HA 3 days ago; started with "heartburn" 2 days ago, now having diarrhea and vomiting.  Reports belching "rotten egg taste" and emesis "smells like poop".  Denies any abd pain.

## 2018-04-30 NOTE — ED Notes (Signed)
ekg given to [place]

## 2018-04-30 NOTE — ED Triage Notes (Signed)
Pt presents from [facility] for evaluation of ongoing N/V/D x 1 day. States has had heartburn as well but is on protonix and is typically well controlled. States headache x 3 days.

## 2018-04-30 NOTE — Discharge Instructions (Addendum)
Recommending further evaluation and management in ED for persistent nausea, vomiting and diarrhea with critically low calcium of 0.86 Patient aware and in agreement with this plan.

## 2018-06-27 ENCOUNTER — Emergency Department (HOSPITAL_COMMUNITY): Payer: 59

## 2018-06-27 ENCOUNTER — Encounter (HOSPITAL_COMMUNITY): Payer: Self-pay | Admitting: Emergency Medicine

## 2018-06-27 ENCOUNTER — Inpatient Hospital Stay (HOSPITAL_COMMUNITY)
Admission: EM | Admit: 2018-06-27 | Discharge: 2018-07-02 | DRG: 871 | Disposition: A | Discharge status: Home / Self Care | Payer: 59 | Attending: Family Medicine | Admitting: Family Medicine

## 2018-06-27 DIAGNOSIS — Z6841 Body Mass Index (BMI) 40.0 and over, adult: Secondary | ICD-10-CM

## 2018-06-27 DIAGNOSIS — A4189 Other specified sepsis: Secondary | ICD-10-CM | POA: Diagnosis present

## 2018-06-27 DIAGNOSIS — Z881 Allergy status to other antibiotic agents status: Secondary | ICD-10-CM

## 2018-06-27 DIAGNOSIS — N179 Acute kidney failure, unspecified: Secondary | ICD-10-CM | POA: Diagnosis present

## 2018-06-27 DIAGNOSIS — E119 Type 2 diabetes mellitus without complications: Secondary | ICD-10-CM | POA: Diagnosis present

## 2018-06-27 DIAGNOSIS — E785 Hyperlipidemia, unspecified: Secondary | ICD-10-CM | POA: Diagnosis present

## 2018-06-27 DIAGNOSIS — Z9049 Acquired absence of other specified parts of digestive tract: Secondary | ICD-10-CM

## 2018-06-27 DIAGNOSIS — Z888 Allergy status to other drugs, medicaments and biological substances status: Secondary | ICD-10-CM

## 2018-06-27 DIAGNOSIS — Z833 Family history of diabetes mellitus: Secondary | ICD-10-CM

## 2018-06-27 DIAGNOSIS — A419 Sepsis, unspecified organism: Secondary | ICD-10-CM | POA: Diagnosis present

## 2018-06-27 DIAGNOSIS — E860 Dehydration: Secondary | ICD-10-CM | POA: Diagnosis present

## 2018-06-27 DIAGNOSIS — E669 Obesity, unspecified: Secondary | ICD-10-CM | POA: Diagnosis present

## 2018-06-27 DIAGNOSIS — Z90710 Acquired absence of both cervix and uterus: Secondary | ICD-10-CM | POA: Diagnosis not present

## 2018-06-27 DIAGNOSIS — Z87891 Personal history of nicotine dependence: Secondary | ICD-10-CM | POA: Diagnosis not present

## 2018-06-27 DIAGNOSIS — E039 Hypothyroidism, unspecified: Secondary | ICD-10-CM | POA: Diagnosis present

## 2018-06-27 DIAGNOSIS — E1165 Type 2 diabetes mellitus with hyperglycemia: Secondary | ICD-10-CM | POA: Diagnosis present

## 2018-06-27 DIAGNOSIS — Z885 Allergy status to narcotic agent status: Secondary | ICD-10-CM

## 2018-06-27 DIAGNOSIS — R7401 Elevation of levels of liver transaminase levels: Secondary | ICD-10-CM

## 2018-06-27 DIAGNOSIS — Z23 Encounter for immunization: Secondary | ICD-10-CM | POA: Diagnosis not present

## 2018-06-27 DIAGNOSIS — R0902 Hypoxemia: Secondary | ICD-10-CM | POA: Diagnosis present

## 2018-06-27 DIAGNOSIS — Z8249 Family history of ischemic heart disease and other diseases of the circulatory system: Secondary | ICD-10-CM

## 2018-06-27 DIAGNOSIS — N170 Acute kidney failure with tubular necrosis: Secondary | ICD-10-CM | POA: Diagnosis not present

## 2018-06-27 DIAGNOSIS — A0472 Enterocolitis due to Clostridium difficile, not specified as recurrent: Secondary | ICD-10-CM | POA: Diagnosis present

## 2018-06-27 DIAGNOSIS — Z794 Long term (current) use of insulin: Secondary | ICD-10-CM | POA: Diagnosis not present

## 2018-06-27 DIAGNOSIS — Z9641 Presence of insulin pump (external) (internal): Secondary | ICD-10-CM | POA: Diagnosis present

## 2018-06-27 DIAGNOSIS — Z8349 Family history of other endocrine, nutritional and metabolic diseases: Secondary | ICD-10-CM

## 2018-06-27 DIAGNOSIS — E1169 Type 2 diabetes mellitus with other specified complication: Secondary | ICD-10-CM | POA: Diagnosis present

## 2018-06-27 DIAGNOSIS — E876 Hypokalemia: Secondary | ICD-10-CM | POA: Diagnosis not present

## 2018-06-27 DIAGNOSIS — Z79899 Other long term (current) drug therapy: Secondary | ICD-10-CM

## 2018-06-27 DIAGNOSIS — R6520 Severe sepsis without septic shock: Secondary | ICD-10-CM | POA: Diagnosis present

## 2018-06-27 DIAGNOSIS — I10 Essential (primary) hypertension: Secondary | ICD-10-CM | POA: Diagnosis present

## 2018-06-27 DIAGNOSIS — Z7982 Long term (current) use of aspirin: Secondary | ICD-10-CM

## 2018-06-27 DIAGNOSIS — G9341 Metabolic encephalopathy: Secondary | ICD-10-CM | POA: Diagnosis present

## 2018-06-27 DIAGNOSIS — Z880 Allergy status to penicillin: Secondary | ICD-10-CM | POA: Diagnosis not present

## 2018-06-27 DIAGNOSIS — R740 Nonspecific elevation of levels of transaminase and lactic acid dehydrogenase [LDH]: Secondary | ICD-10-CM

## 2018-06-27 DIAGNOSIS — Z79890 Hormone replacement therapy (postmenopausal): Secondary | ICD-10-CM

## 2018-06-27 HISTORY — DX: Type 2 diabetes mellitus without complications: E11.9

## 2018-06-27 HISTORY — DX: Personal history of other medical treatment: Z92.89

## 2018-06-27 HISTORY — DX: Cardiac murmur, unspecified: R01.1

## 2018-06-27 HISTORY — DX: Unspecified chronic bronchitis: J42

## 2018-06-27 HISTORY — DX: Migraine, unspecified, not intractable, without status migrainosus: G43.909

## 2018-06-27 HISTORY — DX: Personal history of other diseases of the digestive system: Z87.19

## 2018-06-27 HISTORY — DX: Bipolar disorder, unspecified: F31.9

## 2018-06-27 HISTORY — DX: Cerebral infarction, unspecified: I63.9

## 2018-06-27 HISTORY — DX: Anxiety disorder, unspecified: F41.9

## 2018-06-27 HISTORY — DX: Chronic gastric ulcer without hemorrhage or perforation: K25.7

## 2018-06-27 HISTORY — DX: Unspecified asthma, uncomplicated: J45.909

## 2018-06-27 HISTORY — DX: Gastro-esophageal reflux disease without esophagitis: K21.9

## 2018-06-27 LAB — CBC WITH DIFFERENTIAL/PLATELET
Abs Immature Granulocytes: 0.1 10*3/uL — ABNORMAL HIGH (ref 0.00–0.07)
Basophils Absolute: 0.1 10*3/uL (ref 0.0–0.1)
Basophils Relative: 0 %
EOS ABS: 1.9 10*3/uL — AB (ref 0.0–0.5)
EOS PCT: 10 %
HEMATOCRIT: 45.6 % (ref 36.0–46.0)
HEMOGLOBIN: 13.6 g/dL (ref 12.0–15.0)
Immature Granulocytes: 1 %
LYMPHS ABS: 7.2 10*3/uL — AB (ref 0.7–4.0)
Lymphocytes Relative: 38 %
MCH: 19.9 pg — AB (ref 26.0–34.0)
MCHC: 29.8 g/dL — ABNORMAL LOW (ref 30.0–36.0)
MCV: 66.8 fL — ABNORMAL LOW (ref 80.0–100.0)
MONO ABS: 1.2 10*3/uL — AB (ref 0.1–1.0)
MONOS PCT: 6 %
Neutro Abs: 8.7 10*3/uL — ABNORMAL HIGH (ref 1.7–7.7)
Neutrophils Relative %: 45 %
Platelets: 370 10*3/uL (ref 150–400)
RBC: 6.83 MIL/uL — ABNORMAL HIGH (ref 3.87–5.11)
RDW: 17.7 % — ABNORMAL HIGH (ref 11.5–15.5)
WBC: 19.2 10*3/uL — ABNORMAL HIGH (ref 4.0–10.5)
nRBC: 0.1 % (ref 0.0–0.2)

## 2018-06-27 LAB — COMPREHENSIVE METABOLIC PANEL
ALK PHOS: 73 U/L (ref 38–126)
ALT: 56 U/L — AB (ref 0–44)
AST: 50 U/L — ABNORMAL HIGH (ref 15–41)
Albumin: 3.6 g/dL (ref 3.5–5.0)
Anion gap: 12 (ref 5–15)
BILIRUBIN TOTAL: 0.8 mg/dL (ref 0.3–1.2)
BUN: 19 mg/dL (ref 6–20)
CALCIUM: 9.8 mg/dL (ref 8.9–10.3)
CO2: 22 mmol/L (ref 22–32)
CREATININE: 1.31 mg/dL — AB (ref 0.44–1.00)
Chloride: 101 mmol/L (ref 98–111)
GFR calc Af Amer: 52 mL/min — ABNORMAL LOW (ref >60)
GFR calc non Af Amer: 45 mL/min — ABNORMAL LOW (ref >60)
GLUCOSE: 183 mg/dL — AB (ref 70–99)
Potassium: 2.4 mmol/L — CL (ref 3.5–5.1)
Sodium: 135 mmol/L (ref 135–145)
TOTAL PROTEIN: 6.3 g/dL — AB (ref 6.5–8.1)

## 2018-06-27 LAB — I-STAT CG4 LACTIC ACID, ED
LACTIC ACID, VENOUS: 2.76 mmol/L — AB (ref 0.5–1.9)
Lactic Acid, Venous: 5.08 mmol/L (ref 0.5–1.9)

## 2018-06-27 LAB — ETHANOL

## 2018-06-27 LAB — URINALYSIS, ROUTINE W REFLEX MICROSCOPIC
BACTERIA UA: NONE SEEN
BILIRUBIN URINE: NEGATIVE
Glucose, UA: >=500 mg/dL — AB
Hgb urine dipstick: NEGATIVE
Ketones, ur: NEGATIVE mg/dL
Leukocytes, UA: NEGATIVE
Nitrite: NEGATIVE
PROTEIN: NEGATIVE mg/dL
Specific Gravity, Urine: 1.036 — ABNORMAL HIGH (ref 1.005–1.030)
pH: 6 (ref 5.0–8.0)

## 2018-06-27 LAB — GLUCOSE, CAPILLARY
Glucose-Capillary: 203 mg/dL — ABNORMAL HIGH (ref 70–99)
Glucose-Capillary: 278 mg/dL — ABNORMAL HIGH (ref 70–99)

## 2018-06-27 LAB — MRSA PCR SCREENING: MRSA by PCR: NEGATIVE

## 2018-06-27 LAB — RAPID URINE DRUG SCREEN, HOSP PERFORMED
Amphetamines: NOT DETECTED
Barbiturates: NOT DETECTED
Benzodiazepines: NOT DETECTED
Cocaine: NOT DETECTED
OPIATES: NOT DETECTED
Tetrahydrocannabinol: NOT DETECTED

## 2018-06-27 LAB — SALICYLATE LEVEL

## 2018-06-27 LAB — HEMOGLOBIN A1C
HEMOGLOBIN A1C: 9.8 % — AB (ref 4.8–5.6)
Mean Plasma Glucose: 234.56 mg/dL

## 2018-06-27 LAB — POTASSIUM: POTASSIUM: 3.2 mmol/L — AB (ref 3.5–5.1)

## 2018-06-27 LAB — CBG MONITORING, ED
GLUCOSE-CAPILLARY: 177 mg/dL — AB (ref 70–99)
GLUCOSE-CAPILLARY: 189 mg/dL — AB (ref 70–99)
Glucose-Capillary: 76 mg/dL (ref 70–99)
Glucose-Capillary: 122 mg/dL — ABNORMAL HIGH (ref 70–99)

## 2018-06-27 LAB — TSH: TSH: 3.588 u[IU]/mL (ref 0.350–4.500)

## 2018-06-27 LAB — CK: CK TOTAL: 30 U/L — AB (ref 38–234)

## 2018-06-27 LAB — T4, FREE: Free T4: 1.14 ng/dL (ref 0.82–1.77)

## 2018-06-27 LAB — ACETAMINOPHEN LEVEL: Acetaminophen (Tylenol), Serum: <10 ug/mL — ABNORMAL LOW (ref 10–30)

## 2018-06-27 LAB — MAGNESIUM: MAGNESIUM: 1.6 mg/dL — AB (ref 1.7–2.4)

## 2018-06-27 LAB — PROCALCITONIN: Procalcitonin: <0.1 ng/mL

## 2018-06-27 LAB — LACTIC ACID, PLASMA
Lactic Acid, Venous: 2.1 mmol/L (ref 0.5–1.9)
Lactic Acid, Venous: 2.7 mmol/L (ref 0.5–1.9)

## 2018-06-27 MED ORDER — ASPIRIN EC 81 MG PO TBEC
81.0000 mg | DELAYED_RELEASE_TABLET | ORAL | Status: DC
  Administered 2018-06-28 – 2018-07-02 (×5): 81 mg via ORAL
  Filled 2018-06-27 (×6): qty 1

## 2018-06-27 MED ORDER — VANCOMYCIN HCL 10 G IV SOLR
2000.0000 mg | Freq: Once | INTRAVENOUS | Status: AC
  Administered 2018-06-27: 2000 mg via INTRAVENOUS
  Filled 2018-06-27: qty 2000

## 2018-06-27 MED ORDER — FLUOXETINE HCL 20 MG PO CAPS
40.0000 mg | ORAL_CAPSULE | ORAL | Status: DC
  Administered 2018-06-28 – 2018-06-29 (×2): 40 mg via ORAL
  Filled 2018-06-27 (×3): qty 2

## 2018-06-27 MED ORDER — SODIUM CHLORIDE 0.9 % IV BOLUS (SEPSIS)
1000.0000 mL | Freq: Once | INTRAVENOUS | Status: AC
  Administered 2018-06-27: 1000 mL via INTRAVENOUS

## 2018-06-27 MED ORDER — POTASSIUM CHLORIDE 10 MEQ/100ML IV SOLN
10.0000 meq | INTRAVENOUS | Status: AC
  Administered 2018-06-27 (×2): 10 meq via INTRAVENOUS
  Filled 2018-06-27 (×2): qty 100

## 2018-06-27 MED ORDER — ACETAMINOPHEN 650 MG RE SUPP: 650.0000 mg | Freq: Four times a day (QID) | RECTAL | Status: DC | PRN

## 2018-06-27 MED ORDER — VANCOMYCIN HCL IN DEXTROSE 1-5 GM/200ML-% IV SOLN: 1000.0000 mg | Freq: Once | INTRAVENOUS | Status: DC

## 2018-06-27 MED ORDER — INSULIN ASPART 100 UNIT/ML ~~LOC~~ SOLN
0.0000 [IU] | Freq: Three times a day (TID) | SUBCUTANEOUS | Status: DC
  Administered 2018-06-27: 4 [IU] via SUBCUTANEOUS
  Administered 2018-06-27: 7 [IU] via SUBCUTANEOUS
  Administered 2018-06-28 (×2): 20 [IU] via SUBCUTANEOUS
  Administered 2018-06-28 – 2018-06-30 (×5): 7 [IU] via SUBCUTANEOUS
  Administered 2018-06-30 – 2018-07-01 (×3): 4 [IU] via SUBCUTANEOUS
  Administered 2018-07-01: 7 [IU] via SUBCUTANEOUS
  Administered 2018-07-01: 4 [IU] via SUBCUTANEOUS
  Administered 2018-07-02: 7 [IU] via SUBCUTANEOUS
  Administered 2018-07-02: 11 [IU] via SUBCUTANEOUS

## 2018-06-27 MED ORDER — ACETAMINOPHEN 325 MG PO TABS: 650.0000 mg | ORAL_TABLET | Freq: Four times a day (QID) | ORAL | Status: DC | PRN

## 2018-06-27 MED ORDER — POTASSIUM CHLORIDE IN NACL 40-0.9 MEQ/L-% IV SOLN
INTRAVENOUS | Status: DC
  Administered 2018-06-28: 100 mL/h via INTRAVENOUS
  Administered 2018-06-28: 125 mL/h via INTRAVENOUS
  Administered 2018-06-29: 100 mL/h via INTRAVENOUS
  Filled 2018-06-27 (×4): qty 1000

## 2018-06-27 MED ORDER — LEVOTHYROXINE SODIUM 150 MCG PO TABS
150.0000 ug | ORAL_TABLET | Freq: Every day | ORAL | Status: DC
  Administered 2018-06-28 – 2018-07-01 (×4): 150 ug via ORAL
  Filled 2018-06-27: qty 1
  Filled 2018-06-27 (×2): qty 2
  Filled 2018-06-27 (×2): qty 1
  Filled 2018-06-27: qty 2

## 2018-06-27 MED ORDER — SODIUM CHLORIDE 0.9 % IV BOLUS
500.0000 mL | Freq: Once | INTRAVENOUS | Status: AC
  Administered 2018-06-27: 500 mL via INTRAVENOUS

## 2018-06-27 MED ORDER — SODIUM CHLORIDE 0.9 % IV SOLN
INTRAVENOUS | Status: DC
  Administered 2018-06-27: 09:00:00 via INTRAVENOUS

## 2018-06-27 MED ORDER — POTASSIUM CHLORIDE CRYS ER 20 MEQ PO TBCR
40.0000 meq | EXTENDED_RELEASE_TABLET | Freq: Once | ORAL | Status: AC
  Administered 2018-06-27: 40 meq via ORAL
  Filled 2018-06-27: qty 2

## 2018-06-27 MED ORDER — SODIUM CHLORIDE 0.9 % IV SOLN
1.0000 g | Freq: Three times a day (TID) | INTRAVENOUS | Status: DC
  Administered 2018-06-27 – 2018-06-28 (×3): 1 g via INTRAVENOUS
  Filled 2018-06-27 (×4): qty 1

## 2018-06-27 MED ORDER — INSULIN ASPART 100 UNIT/ML ~~LOC~~ SOLN
0.0000 [IU] | Freq: Every day | SUBCUTANEOUS | Status: DC
  Administered 2018-06-27 – 2018-06-30 (×3): 3 [IU] via SUBCUTANEOUS

## 2018-06-27 MED ORDER — SODIUM CHLORIDE 0.9 % IV SOLN
2.0000 g | Freq: Once | INTRAVENOUS | Status: AC
  Administered 2018-06-27: 2 g via INTRAVENOUS
  Filled 2018-06-27: qty 2

## 2018-06-27 MED ORDER — SODIUM CHLORIDE 0.9 % IV SOLN
INTRAVENOUS | Status: DC
  Administered 2018-06-27: 16:00:00 via INTRAVENOUS
  Filled 2018-06-27 (×3): qty 1000

## 2018-06-27 MED ORDER — SODIUM CHLORIDE 0.9 % IV BOLUS (SEPSIS)
500.0000 mL | Freq: Once | INTRAVENOUS | Status: AC
  Administered 2018-06-27: 500 mL via INTRAVENOUS

## 2018-06-27 MED ORDER — VANCOMYCIN HCL IN DEXTROSE 750-5 MG/150ML-% IV SOLN
750.0000 mg | Freq: Two times a day (BID) | INTRAVENOUS | Status: DC
  Administered 2018-06-27: 750 mg via INTRAVENOUS
  Filled 2018-06-27 (×2): qty 150

## 2018-06-27 MED ORDER — ONDANSETRON HCL 4 MG PO TABS: 4.0000 mg | ORAL_TABLET | Freq: Four times a day (QID) | ORAL | Status: DC | PRN

## 2018-06-27 MED ORDER — ONDANSETRON HCL 4 MG/2ML IJ SOLN: 4.0000 mg | Freq: Four times a day (QID) | INTRAMUSCULAR | Status: DC | PRN

## 2018-06-27 MED ORDER — METRONIDAZOLE IN NACL 5-0.79 MG/ML-% IV SOLN
500.0000 mg | Freq: Three times a day (TID) | INTRAVENOUS | Status: DC
  Administered 2018-06-27 – 2018-07-01 (×14): 500 mg via INTRAVENOUS
  Filled 2018-06-27 (×16): qty 100

## 2018-06-27 MED ORDER — ENOXAPARIN SODIUM 40 MG/0.4ML ~~LOC~~ SOLN
40.0000 mg | SUBCUTANEOUS | Status: DC
  Administered 2018-06-28 – 2018-07-02 (×5): 40 mg via SUBCUTANEOUS
  Filled 2018-06-27 (×5): qty 0.4

## 2018-06-27 MED ORDER — SODIUM CHLORIDE 0.9% FLUSH
3.0000 mL | Freq: Two times a day (BID) | INTRAVENOUS | Status: DC
  Administered 2018-06-27 – 2018-07-02 (×5): 3 mL via INTRAVENOUS

## 2018-06-27 MED ORDER — DOXEPIN HCL 50 MG PO CAPS
50.0000 mg | ORAL_CAPSULE | Freq: Every day | ORAL | Status: DC
  Administered 2018-06-27 – 2018-07-01 (×5): 50 mg via ORAL
  Filled 2018-06-27 (×6): qty 1

## 2018-06-27 MED ORDER — LURASIDONE HCL 40 MG PO TABS
40.0000 mg | ORAL_TABLET | Freq: Every day | ORAL | Status: DC
  Administered 2018-06-28 – 2018-07-02 (×5): 40 mg via ORAL
  Filled 2018-06-27 (×5): qty 1

## 2018-06-27 MED ORDER — MAGNESIUM SULFATE 2 GM/50ML IV SOLN
2.0000 g | Freq: Once | INTRAVENOUS | Status: AC
  Administered 2018-06-27: 2 g via INTRAVENOUS
  Filled 2018-06-27: qty 50

## 2018-06-27 MED ORDER — INSULIN GLARGINE 100 UNIT/ML ~~LOC~~ SOLN
10.0000 [IU] | Freq: Every day | SUBCUTANEOUS | Status: DC
  Administered 2018-06-27: 10 [IU] via SUBCUTANEOUS
  Filled 2018-06-27 (×3): qty 0.1

## 2018-06-27 MED ORDER — QUETIAPINE FUMARATE 50 MG PO TABS
150.0000 mg | ORAL_TABLET | Freq: Every day | ORAL | Status: DC
  Administered 2018-06-27 – 2018-07-01 (×5): 150 mg via ORAL
  Filled 2018-06-27 (×6): qty 1

## 2018-06-27 MED ORDER — PANTOPRAZOLE SODIUM 40 MG PO TBEC
40.0000 mg | DELAYED_RELEASE_TABLET | ORAL | Status: DC
  Administered 2018-06-28 – 2018-07-02 (×5): 40 mg via ORAL
  Filled 2018-06-27 (×5): qty 1

## 2018-06-27 NOTE — ED Provider Notes (Signed)
[facility] Provider Note   CSN: [id number] Arrival date & time: 06/27/18  0429     History   Chief Complaint Chief Complaint  Patient presents with  . Lethargic    HPI Morgan [name] is a 52 y.o. female.  The history is provided by the EMS personnel. The history is limited by the condition of the patient (Altered mental status).  She has history of hypertension, diabetes, hyperlipidemia, thalassemia minor and was brought in by ambulance after being found unresponsive.  She had gone to her office and her son found her laying on the floor.  She had reportedly been having problems with vomiting for the last 4 days.  No further history is immediately available.  She was noted to be hypotensive in route.  Patient is able to answer some simple questions and states that she is just feeling sleepy.  She denies any pain.  Past Medical History:  Diagnosis Date  . Diabetes mellitus without complication (HCC)    Type 2  . Fibroid   . Hyperlipidemia   . Hypertension   . Hypothyroid   . 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 Thalassemia minor   . Vitamin D deficiency     Patient Active Problem List   Diagnosis Date Noted  . Chest pain, rule out acute myocardial infarction 05/14/2016  . Chest pain 05/14/2016  . Hyperlipidemia 05/14/2016  . Hypothyroidism 05/14/2016  . Essential hypertension 05/14/2016    Past Surgical History:  Procedure Laterality Date  . ABDOMINAL HYSTERECTOMY    . CESAREAN SECTION    . CHOLECYSTECTOMY    . KNEE SURGERY    . SUBCLAVIAN STENT PLACEMENT    . TONSILLECTOMY       OB History    Gravida  6   Para  2   Term  2   Preterm      AB  4   Living  2     SAB  4   TAB      Ectopic      Multiple      Live Births               Home Medications    Prior to Admission medications   Medication Sig Start Date End Date Taking? Authorizing Provider  aspirin 81 MG tablet Take 81 mg by mouth  every morning.     [provider]  Aspirin-Salicylamide-Caffeine (BC HEADACHE POWDER PO) Take 1 Package by mouth every 6 (six) hours as needed (dental pain).    [provider]  Cholecalciferol (VITAMIN D) 2000 units CAPS Take 2,000-4,000 Units by mouth See admin instructions. 4,000units daily on Mon/Wed/Fri and 2,000units daily on all other days    [provider]  Co-Enzyme Q-10 30 MG CAPS Take 30 mg by mouth daily.     [provider]  doxepin (SINEQUAN) 25 MG capsule Take 50 mg by mouth at bedtime.     [provider]  EPINEPHrine 0.3 mg/0.3 mL IJ SOAJ injection Inject 0.3 mg into the muscle once as needed. 02/08/18   [provider]  FLUoxetine (PROZAC) 20 MG tablet Take 40 mg by mouth every morning.     [provider]  GLUCAGON EMERGENCY 1 MG injection Inject 1 mg into the vein once as needed. USE AT ONSET OF HYPOGLYCEMIA 02/09/18   [provider]  insulin lispro (HUMALOG) 100 UNIT/ML injection Inject 200 Units into the skin daily. Using in  pump 09/06/17   [provider]  levothyroxine (SYNTHROID, LEVOTHROID) 150 MCG tablet Take 150 mcg by mouth daily.     [provider]  losartan (COZAAR) 100 MG tablet Take 100 mg by mouth daily.    [provider]  Multiple Vitamin (MULTIVITAMIN WITH MINERALS) TABS tablet Take 1 tablet by mouth daily.    [provider]  naproxen sodium (ALEVE) 220 MG tablet Take 440 mg by mouth 2 (two) times daily as needed (PAIN).    [provider]  pantoprazole (PROTONIX) 20 MG tablet Take 40 mg by mouth every morning.     [provider]  potassium chloride (K-DUR) 10 MEQ tablet Take 1 tablet (10 mEq total) by mouth daily for 14 days. 01/06/18 01/20/18  Fatima Blank, MD  QUEtiapine (SEROQUEL) 50 MG tablet Take 150 mg by mouth at bedtime.    [provider]  Semaglutide (OZEMPIC, 0.25 OR 0.5 MG/DOSE, [place]) Inject 0.5 mg into the skin  every Monday.     [provider]    Family History Family History  Problem Relation Age of Onset  . Diabetes Mother   . Hypertension Mother   . Hyperlipidemia Mother   . CAD Mother   . CAD Father   . Heart failure Father   . Hypertension Sister   . Hyperlipidemia Sister   . Hypertension Brother   . Hyperlipidemia Brother     Social History Social History   Tobacco Use  . Smoking status: Former Smoker    Types: Cigarettes    Last attempt to quit: 05/14/2014    Years since quitting: 4.1  . Smokeless tobacco: Never Used  Substance Use Topics  . Alcohol use: No  . Drug use: No     Allergies   Ilosone [erythromycin]; Keflex [cephalexin]; Penicillins; Iron; Metformin and related; and Morphine and related   Review of Systems Review of Systems  Unable to perform ROS: Mental status change     Physical Exam Updated Vital Signs BP (!) 81/54 (BP Location: Right Arm)   Pulse (!) 102   Temp 100.2 F (37.9 C) (Rectal)   Resp 12   Ht 5\' 6"  (1.676 m)   Wt 113.4 kg   LMP 08/03/2013   SpO2 97%   BMI 40.35 kg/m   Physical Exam  Nursing note and vitals reviewed.  52 year old female, resting comfortably and in no acute distress. Vital signs are significant for borderline fever, borderline elevated heart rate, low blood pressure. Oxygen saturation is 97%, which is normal. Head is normocephalic and atraumatic. PERRLA, EOMI. Oropharynx is clear. Neck is nontender and supple without adenopathy or JVD. Back is nontender and there is no CVA tenderness. Lungs are clear without rales, wheezes, or rhonchi. Chest is nontender. Heart has regular rate and rhythm without murmur. Abdomen is soft, flat, nontender without masses or hepatosplenomegaly and peristalsis is normoactive. Extremities have no cyanosis or edema, full range of motion is present. Skin is warm and dry without rash. Neurologic: Awake and able to answer simple questions by nodding her head or stating yes  or no, cranial nerves are intact, there are no motor or sensory deficits.  ED Treatments / Results  Labs (all labs ordered are listed, but only abnormal results are displayed) Labs Reviewed  COMPREHENSIVE METABOLIC PANEL - Abnormal; Notable for the following components:      Result Value   Potassium 2.4 (*)    Glucose, Bld 183 (*)    Creatinine, Ser  1.31 (*)    Total Protein 6.3 (*)    AST 50 (*)    ALT 56 (*)    GFR calc non Af Amer 45 (*)    GFR calc Af Amer 52 (*)    All other components within normal limits  CBC WITH DIFFERENTIAL/PLATELET - Abnormal; Notable for the following components:   WBC 19.2 (*)    RBC 6.83 (*)    MCV 66.8 (*)    MCH 19.9 (*)    MCHC 29.8 (*)    RDW 17.7 (*)    Neutro Abs 8.7 (*)    Lymphs Abs 7.2 (*)    Monocytes Absolute 1.2 (*)    Eosinophils Absolute 1.9 (*)    Abs Immature Granulocytes 0.10 (*)    All other components within normal limits  URINALYSIS, ROUTINE W REFLEX MICROSCOPIC - Abnormal; Notable for the following components:   APPearance HAZY (*)    Specific Gravity, Urine 1.036 (*)    Glucose, UA >=500 (*)    All other components within normal limits  I-STAT CG4 LACTIC ACID, ED - Abnormal; Notable for the following components:   Lactic Acid, Venous 5.08 (*)    All other components within normal limits  CBG MONITORING, ED - Abnormal; Notable for the following components:   Glucose-Capillary 177 (*)    All other components within normal limits  CULTURE, BLOOD (ROUTINE X 2)  CULTURE, BLOOD (ROUTINE X 2)  URINE CULTURE  RAPID URINE DRUG SCREEN, HOSP PERFORMED    EKG EKG Interpretation  Date/Time:  Tuesday June 27 2018 04:55:24 EST Ventricular Rate:  101 PR Interval:    QRS Duration: 93 QT Interval:  384 QTC Calculation: 498 R Axis:   46 Text Interpretation:  Sinus tachycardia Borderline ST depression, diffuse leads Borderline prolonged QT interval When compared with ECG of 04/30/2018, No significant change was found  Confirmed by Delora Fuel ([id number]) on 06/27/2018 5:05:18 AM  Radiology Dg Chest Port 1 View  Result Date: 06/27/2018 CLINICAL DATA:  Fever EXAM: PORTABLE CHEST 1 VIEW COMPARISON:  10/10/2017 FINDINGS: The heart size and mediastinal contours are within normal limits. Both lungs are clear. The visualized skeletal structures are unremarkable. IMPRESSION: No active disease. Electronically Signed   By: Ulyses Jarred M.D.   On: 06/27/2018 05:17    Procedures Procedures  CRITICAL CARE Performed by: Delora Fuel Total critical care time: 80 minutes Critical care time was exclusive of separately billable procedures and treating other patients. Critical care was necessary to treat or prevent imminent or life-threatening deterioration. Critical care was time spent personally by me on the following activities: development of treatment plan with patient and/or surrogate as well as nursing, discussions with consultants, evaluation of patient's response to treatment, examination of patient, obtaining history from patient or surrogate, ordering and performing treatments and interventions, ordering and review of laboratory studies, ordering and review of radiographic studies, pulse oximetry and re-evaluation of patient's condition.  Medications Ordered in ED Medications  sodium chloride 0.9 % bolus 1,000 mL (has no administration in time range)    And  sodium chloride 0.9 % bolus 1,000 mL (has no administration in time range)    And  sodium chloride 0.9 % bolus 1,000 mL (has no administration in time range)    And  sodium chloride 0.9 % bolus 500 mL (has no administration in time range)  aztreonam (AZACTAM) 2 g in sodium chloride 0.9 % 100 mL IVPB (has no administration in time range)  metroNIDAZOLE (FLAGYL)  IVPB 500 mg (has no administration in time range)  vancomycin (VANCOCIN) 2,000 mg in sodium chloride 0.9 % 500 mL IVPB (has no administration in time range)     Initial Impression / Assessment and  Plan / ED Course  I have reviewed the triage vital signs and the nursing notes.  Pertinent labs & imaging results that were available during my care of the patient were reviewed by me and considered in my medical decision making (see chart for details).  Probable sepsis with borderline fever and hypotension and altered mentation.  She is started on sepsis pathway and started immediately on early goal directed fluid resuscitation and empiric antibiotics for sepsis of undetermined source.  Old records are reviewed, and she has no relevant past visits.  Blood pressure is come up with fluids.  Chest x-ray shows no evidence of pneumonia.  Urinalysis shows no evidence of infection.  Lactic acid is significantly elevated to 5.0.  WBC is markedly elevated to 19.2, but with normal differential.  Chemistries shows severe hypokalemia and evidence of acute kidney injury with creatinine 1.3.  Also, mild elevation of transaminases.  Transaminase elevation is not significantly changed from September 29.  Case is discussed with Dr. Hal Hope of [facility], who agrees to admit the patient.  Final Clinical Impressions(s) / ED Diagnoses   Final diagnoses:  Sepsis, due to unspecified organism, unspecified whether acute organ dysfunction present ([place])  Hypokalemia  Elevated transaminase level  Acute kidney injury (nontraumatic) [facility])    ED Discharge Orders    None       Delora Fuel, MD 94/50/38 [phone]

## 2018-06-27 NOTE — H&P (Signed)
History and Physical    Morgan [name] VPX:[id number] DOB: [birth year] DOA: 06/27/2018  PCP: Bradly Bienenstock., MD Consultants:  [place] Lank - endocrinology; [facility] - psychiatry Patient coming from:  Home - lives with son; [place]: Son  Chief Complaint: Unresponsive  HPI: Morgan [name] is a 52 y.o. female with medical history significant of hypothyroidism; HTN; HLD; and DM presenting with unresponsiveness.  She is not sure why she is in the ER.  She remembers lying down in her bed feeling hot - sometime last night.  She felt well yesterday.  She has not vomited in a few days - she had n/v for several days before but that was better.  No cough.  SOB with exertion.  +diarrhea, still ongoing, last stool in the ER.  She has been having 3-4 stools per day for maybe a month.  She has not had recent antibiotics.  Subjective fever since she began vomiting.  She saw Dr. Denton Lank on 11/22.  She was having trouble keeping her medications down - she was able to keep them down yesterday for the first time.  Denies intentional or unintentional overdose.  She currently feels a little confused, tired, cold, and thirsty.   ED Course:  Carryover, per Dr. Hal Hope:  52 year old female with history of diabetes mellitus type 2 hypertension hypothyroidism was found to be unresponsive at office by patient's son. Patient has been having nausea vomiting and diarrhea for last 5 days. In the ER patient appears encephalopathic confused nonfocal. Was hypotensive which improved with fluids lactate was 5 repeat lactate is pending. Patient was started on empiric antibiotics. As per ER physician patient does not look to have any encephalitis or meningitis.  Review of Systems: As per HPI; otherwise review of systems reviewed and negative.   Ambulatory Status:  Ambulates without assistance  Past Medical History:  Diagnosis Date  . Diabetes mellitus without complication (HCC)    Type 2  . Fibroid   . Hyperlipidemia   .  Hypertension   . Hypothyroid   . 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 Thalassemia minor   . Vitamin D deficiency     Past Surgical History:  Procedure Laterality Date  . ABDOMINAL HYSTERECTOMY    . CESAREAN SECTION    . CHOLECYSTECTOMY    . KNEE SURGERY    . SUBCLAVIAN STENT PLACEMENT    . TONSILLECTOMY      Social History   Socioeconomic History  . Marital status: Divorced    Spouse name: Not on file  . Number of children: Not on file  . Years of education: Not on file  . Highest education level: Not on file  Occupational History  . Occupation: Electrical engineer support  Social Needs  . Financial resource strain: Not on file  . Food insecurity:    Worry: Not on file    Inability: Not on file  . Transportation needs:    Medical: Not on file    Non-medical: Not on file  Tobacco Use  . Smoking status: Former Smoker    Years: 36.00    Types: Cigarettes    Last attempt to quit: 05/14/2014    Years since quitting: 4.1  . Smokeless tobacco: Never Used  Substance and Sexual Activity  . Alcohol use: No  . Drug use: No  . Sexual activity: Not on file  Lifestyle  . Physical activity:    Days per week: Not on file    Minutes per session: Not on  file  . Stress: Not on file  Relationships  . Social connections:    Talks on phone: Not on file    Gets together: Not on file    Attends religious service: Not on file    Active member of club or organization: Not on file    Attends meetings of clubs or organizations: Not on file    Relationship status: Not on file  . Intimate partner violence:    Fear of current or ex partner: Not on file    Emotionally abused: Not on file    Physically abused: Not on file    Forced sexual activity: Not on file  Other Topics Concern  . Not on file  Social History Narrative  . Not on file    Allergies  Allergen Reactions  . Ilosone [Erythromycin] Anaphylaxis  . Keflex [Cephalexin] Anaphylaxis  . Penicillins Anaphylaxis     Has patient had a PCN reaction causing immediate rash, facial/tongue/throat swelling, SOB or lightheadedness with hypotension: YES Has patient had a PCN reaction causing severe rash involving mucus membranes or skin necrosis: NO Has patient had a PCN reaction that required hospitalizationNO Has patient had a PCN reaction occurring within the last 10 years: NO If all of the above answers are "NO", then may proceed with Cephalosporin use.  . Iron Other (See Comments)    Pt reports condition limiting Iron intake.  . Metformin And Related Other (See Comments)    Pt prefers to not take this medication  . Morphine And Related Other (See Comments)    Pt reports hallucinations    Family History  Problem Relation Age of Onset  . Diabetes Mother   . Hypertension Mother   . Hyperlipidemia Mother   . CAD Mother   . CAD Father   . Heart failure Father   . Hypertension Sister   . Hyperlipidemia Sister   . Hypertension Brother   . Hyperlipidemia Brother     Prior to Admission medications   Medication Sig Start Date End Date Taking? Authorizing Provider  aspirin 81 MG tablet Take 81 mg by mouth every morning.     [provider]  Aspirin-Salicylamide-Caffeine (BC HEADACHE POWDER PO) Take 1 Package by mouth every 6 (six) hours as needed (dental pain).    [provider]  Cholecalciferol (VITAMIN D) 2000 units CAPS Take 2,000-4,000 Units by mouth See admin instructions. 4,000units daily on Mon/Wed/Fri and 2,000units daily on all other days    [provider]  Co-Enzyme Q-10 30 MG CAPS Take 30 mg by mouth daily.     [provider]  doxepin (SINEQUAN) 25 MG capsule Take 50 mg by mouth at bedtime.     [provider]  EPINEPHrine 0.3 mg/0.3 mL IJ SOAJ injection Inject 0.3 mg into the muscle once as needed. 02/08/18   [provider]  FLUoxetine (PROZAC) 20 MG tablet Take 40 mg by mouth every morning.     [provider]  GLUCAGON EMERGENCY  1 MG injection Inject 1 mg into the vein once as needed. USE AT ONSET OF HYPOGLYCEMIA 02/09/18   [provider]  insulin lispro (HUMALOG) 100 UNIT/ML injection Inject 200 Units into the skin daily. Using in pump 09/06/17   [provider]  levothyroxine (SYNTHROID, LEVOTHROID) 150 MCG tablet Take 150 mcg by mouth daily.     [provider]  losartan (COZAAR) 100 MG tablet Take 100 mg by mouth daily.    [provider]  Multiple  Vitamin (MULTIVITAMIN WITH MINERALS) TABS tablet Take 1 tablet by mouth daily.    [provider]  naproxen sodium (ALEVE) 220 MG tablet Take 440 mg by mouth 2 (two) times daily as needed ([place]).    [provider]  pantoprazole (PROTONIX) 20 MG tablet Take 40 mg by mouth every morning.     [provider]  potassium chloride (K-DUR) 10 MEQ tablet Take 1 tablet (10 mEq total) by mouth daily for 14 days. 01/06/18 01/20/18  Fatima Blank, MD  QUEtiapine (SEROQUEL) 50 MG tablet Take 150 mg by mouth at bedtime.    [provider]  Semaglutide (OZEMPIC, 0.25 OR 0.5 MG/DOSE, [place]) Inject 0.5 mg into the skin every Monday.     [provider]    Physical Exam: Vitals:   06/27/18 1339 06/27/18 1345 06/27/18 1346 06/27/18 1347  BP:  (!) 110/55    Pulse:  99 98 98  Resp:  16 (!) 21 (!) 22  Temp: 98 F (36.7 C)     TempSrc: Oral     SpO2:  (!) 89% 93% 98%  Weight:      Height:         General: Appears calm and comfortable and is NAD; mostly fatigued in appearance Eyes:  PERRL, EOMI, normal lids, iris ENT:  grossly normal hearing, lips & tongue, mmm; suboptimal dentition Neck:  no LAD, masses or thyromegaly Cardiovascular: Mild tachycardia, no m/r/g. No LE edema.  Respiratory:   CTA bilaterally with no wheezes/rales/rhonchi.  Normal respiratory effort. Abdomen:  soft, NT, ND, NABS Back:   normal alignment, no CVAT Skin:  no rash or induration seen on limited exam Musculoskeletal:   grossly normal tone BUE/BLE, good ROM, no bony abnormality Lower extremity:  No LE edema.  Limited foot exam with no ulcerations.  2+ distal pulses. Psychiatric:  grossly normal mood and affect, speech fluent and appropriate, AOx3 Neurologic:  CN 2-12 grossly intact, moves all extremities in coordinated fashion, sensation intact    Radiological Exams on Admission: Dg Chest Port 1 View  Result Date: 06/27/2018 CLINICAL DATA:  Fever EXAM: PORTABLE CHEST 1 VIEW COMPARISON:  10/10/2017 FINDINGS: The heart size and mediastinal contours are within normal limits. Both lungs are clear. The visualized skeletal structures are unremarkable. IMPRESSION: No active disease. Electronically Signed   By: Ulyses Jarred M.D.   On: 06/27/2018 05:17    EKG: Independently reviewed.  Sinus tachycardia with rate 101; nonspecific ST changes with no evidence of acute ischemia   Labs on Admission: I have personally reviewed the available labs and imaging studies at the time of the admission.  Pertinent labs:   Lactate 5.05 -> 2.76 -> 2.1 UA: ?500 glucose, SG 1.036 K+ 2.4 Glucose 183 BUN 19/Creatinine 1.31/GFR 45; 15/0.85/>60 on 9/29 AST 50/ALT 56 - stable WBC 19.2 Procalcitonin <0.10 ASA, APAP, ETOH negative UDS negative CK 30  Assessment/Plan Principal Problem:   Sepsis (HCC) Active Problems:   Hypothyroidism   Essential hypertension   Hypokalemia   Diabetes mellitus type 2 in obese (HCC)   AKI (acute kidney injury) ([place])   Acute metabolic encephalopathy   Sepsis -SIRS criteria in this patient includes: Leukocytosis, tachycardia, tachypnea, hypoxia  -Patient has evidence of acute organ failure with markedly elevated lactate and hypotension - both improved with IVF -While awaiting blood cultures, this appears to be a preseptic condition. -Sepsis protocol initiated -Patient had initial lactate >4 and SBP <90/MAP <65 and so has received the 30 cc/kg IVF bolus. -  Suspected source is  gastroenteritis or other GI illness; GI panel and C diff are pending. -Blood and urine cultures pending -Will admit to SDU given ongoing borderline hypotension and continue to monitor -Will admit due to: hemodynamic instability; AMS that was severe  (now improved) -Treat with IV Aztreonam, Flagyl, Vanc for undifferentiated sepsis -Will add HIV -Will trend lactate to ensure improvement -Sepsis procalcitonin level is PCT <0.1 so it will be reasonable to consider de-escalation of antibiotic coverage quickly with ongoing clinical improvement.  Acute encephalopathy -Patient presenting with encephalopathy as evidenced by her obtunded/unresponsiveness -Suspect that this is related to her acute illness with sepsis and AKI on presentation -Medication misadventure was considered but patient is now alert enough to provide a reasonable history and this seems less likely -Her mental status appears to have returned to baseline after initial stabilization -She does have underlying psychiatric illness (uncertain diagnosis) and her home medications have been continued - Doxepin, Prozac, Seroquel, Latuda -She reports taking both Seroquel and Latuda and Latuda dose was increased from 40 to 60 mg last week, but she appears to still be taking 40 mg daily -Patient denies SI/HI and reports that this was not an intentional or inadvertent overdose -Consider BH consult if concern that this is related to acute presentation  AKI -Likely due to prerenal secondary to moderate to severe dehydration -IVF -Follow up renal function by BMP -Avoid ACEI/ARB and NSAIDs  DM -Suboptimal control as evidenced by A1c 9.8 - although this is improved from 13.2 in 10/17 -She is followed by nephrology and on an insulin pump, but this is on hold currently given concern about her ability to manage the pump -Will give Lantus 10 units for now -Hold Semaglutide -Cover with resistant-scale SSI  HTN -Hold Cozaar in the setting of AKI  and hypotension  Hypokalemia -Suspect that this is related to recent GI illness with significant GI losses. -Repleted in ER with 10 mEq IV KCl, which would be expected to raise K+ to 2.5.   -Will add 40 mEq per L to IVF. -Will also give an additional 40 mEq PO KCl now -Will follow. -Will check Mag level and repeat K+ level now.  Hypothyroidism -Check TSH and free T4 -Continue Synthroid at current dose for now    DVT prophylaxis:  Lovenox Code Status:  Full - confirmed with patient Family Communication: None  Disposition Plan:  Home once clinically improved Consults called: None  Admission status: Admit - It is my clinical opinion that admission to INPATIENT is reasonable and necessary because of the expectation that this patient will require hospital care that crosses at least 2 midnights to treat this condition based on the medical complexity of the problems presented.  Given the aforementioned information, the predictability of an adverse outcome is felt to be significant.   Karmen Bongo MD [facility]  If note is complete, please contact covering daytime or nighttime physician. [email or web address] Password [email or web address]  06/27/2018, 2:00 PM

## 2018-06-27 NOTE — ED Notes (Signed)
Pt is very drowsy and snoring loudly useless her name is called loudly. She will open her eyes momentarily but then closes them back but will continue to answer questions. At this time pt is not alert enough to sit up and take medications.

## 2018-06-27 NOTE — ED Notes (Signed)
Hospital bed ordered for the pt

## 2018-06-27 NOTE — ED Notes (Signed)
BIB EMS from home. Pt has had N/V X4-5 days, this AM pt told her son that she was going to her office, he went to check on her and found her unresponsive. Upon EMS arrival pt only responding to pain, hypotensive en route, pressures 70 systolic.

## 2018-06-27 NOTE — Progress Notes (Signed)
Pharmacy Antibiotic Note  Morgan [name] is a 52 y.o. female admitted on 06/27/2018 with sepsis.  Pharmacy has been consulted for Vancomycin/Aztreonam dosing. WBC is elevated. Mild bump in Scr. Lactic acid elevated but trending down.   Plan: Vancomycin 2000 mg IV x 1, then give 750 mg IV q12h Aztreonam 1g IV q8h Flagyl 500 mg IV q8h Trend WBC, temp, renal function  F/U infectious work-up Drug levels as indicated   Height: 5\' 6"  (167.6 cm) Weight: 250 lb (113.4 kg) IBW/kg (Calculated) : 59.3  Temp (24hrs), Avg:100.2 F (37.9 C), Min:100.2 F (37.9 C), Max:100.2 F (37.9 C)  Recent Labs  Lab 06/27/18 0451 06/27/18 0453 06/27/18 0730  WBC 19.2*  --   --   CREATININE 1.31*  --   --   LATICACIDVEN  --  5.08* 2.76*    Estimated Creatinine Clearance: 62.7 mL/min (A) (by C-G formula based on SCr of 1.31 mg/dL (H)).    Allergies  Allergen Reactions  . Ilosone [Erythromycin] Anaphylaxis  . Keflex [Cephalexin] Anaphylaxis  . Penicillins Anaphylaxis    Has patient had a PCN reaction causing immediate rash, facial/tongue/throat swelling, SOB or lightheadedness with hypotension: YES Has patient had a PCN reaction causing severe rash involving mucus membranes or skin necrosis: NO Has patient had a PCN reaction that required hospitalizationNO Has patient had a PCN reaction occurring within the last 10 years: NO If all of the above answers are "NO", then may proceed with Cephalosporin use.  . Iron Other (See Comments)    Pt reports condition limiting Iron intake.  . Metformin And Related Other (See Comments)    Pt prefers to not take this medication  . Morphine And Related Other (See Comments)    Pt reports hallucinations     Narda Bonds 06/27/2018 7:38 AM

## 2018-06-28 DIAGNOSIS — R6520 Severe sepsis without septic shock: Secondary | ICD-10-CM

## 2018-06-28 DIAGNOSIS — N170 Acute kidney failure with tubular necrosis: Secondary | ICD-10-CM

## 2018-06-28 DIAGNOSIS — A419 Sepsis, unspecified organism: Secondary | ICD-10-CM

## 2018-06-28 LAB — BASIC METABOLIC PANEL
ANION GAP: 5 (ref 5–15)
BUN: 10 mg/dL (ref 6–20)
CHLORIDE: 110 mmol/L (ref 98–111)
CO2: 21 mmol/L — ABNORMAL LOW (ref 22–32)
Calcium: 7.8 mg/dL — ABNORMAL LOW (ref 8.9–10.3)
Creatinine, Ser: 0.87 mg/dL (ref 0.44–1.00)
GFR calc Af Amer: >60 mL/min (ref >60)
GLUCOSE: 322 mg/dL — AB (ref 70–99)
POTASSIUM: 4.2 mmol/L (ref 3.5–5.1)
Sodium: 136 mmol/L (ref 135–145)

## 2018-06-28 LAB — GASTROINTESTINAL PANEL BY PCR, STOOL (REPLACES STOOL CULTURE)

## 2018-06-28 LAB — C DIFFICILE QUICK SCREEN W PCR REFLEX
C Diff antigen: POSITIVE — AB
C Diff interpretation: DETECTED
C Diff toxin: POSITIVE — AB

## 2018-06-28 LAB — GLUCOSE, CAPILLARY
GLUCOSE-CAPILLARY: 421 mg/dL — AB (ref 70–99)
GLUCOSE-CAPILLARY: 382 mg/dL — AB (ref 70–99)
Glucose-Capillary: 221 mg/dL — ABNORMAL HIGH (ref 70–99)
Glucose-Capillary: 271 mg/dL — ABNORMAL HIGH (ref 70–99)

## 2018-06-28 LAB — URINE CULTURE: CULTURE: NO GROWTH

## 2018-06-28 LAB — HIV ANTIBODY (ROUTINE TESTING W REFLEX): HIV Screen 4th Generation wRfx: NONREACTIVE

## 2018-06-28 LAB — MAGNESIUM: Magnesium: 2 mg/dL (ref 1.7–2.4)

## 2018-06-28 MED ORDER — INSULIN GLARGINE 100 UNIT/ML ~~LOC~~ SOLN
20.0000 [IU] | Freq: Every day | SUBCUTANEOUS | Status: DC
  Filled 2018-06-28 (×2): qty 0.2

## 2018-06-28 MED ORDER — INSULIN ASPART 100 UNIT/ML ~~LOC~~ SOLN
4.0000 [IU] | Freq: Three times a day (TID) | SUBCUTANEOUS | Status: DC
  Administered 2018-06-28 – 2018-06-29 (×2): 4 [IU] via SUBCUTANEOUS

## 2018-06-28 MED ORDER — INSULIN GLARGINE 100 UNIT/ML ~~LOC~~ SOLN
60.0000 [IU] | Freq: Every day | SUBCUTANEOUS | Status: DC
  Administered 2018-06-28 – 2018-07-02 (×5): 60 [IU] via SUBCUTANEOUS
  Filled 2018-06-28 (×5): qty 0.6

## 2018-06-28 MED ORDER — VANCOMYCIN 50 MG/ML ORAL SOLUTION
125.0000 mg | Freq: Four times a day (QID) | ORAL | Status: DC
  Administered 2018-06-28 – 2018-07-02 (×17): 125 mg via ORAL
  Filled 2018-06-28 (×20): qty 2.5

## 2018-06-28 NOTE — Progress Notes (Addendum)
Inpatient Diabetes Program Recommendations  AACE/ADA: New Consensus Statement on Inpatient Glycemic Control (2015)  Target Ranges:  Prepandial:   less than 140 mg/dL      Peak postprandial:   less than 180 mg/dL (1-2 hours)      Critically ill patients:  140 - 180 mg/dL   Lab Results  Component Value Date   GLUCAP 421 (H) 06/28/2018   HGBA1C 9.8 (H) 06/27/2018    Review of Glycemic Control Results for Morgan [name], Morgan [name] (MRN [id number]) as of 06/28/2018 10:51  Ref. Range 06/27/2018 16:28 06/27/2018 21:51 06/28/2018 08:18  Glucose-Capillary Latest Ref Range: 70 - 99 mg/dL 203 (H) 278 (H) 421 (H)   Diabetes history: Type 2 DM Outpatient Diabetes medications: Humalog 200 units/day in insulin pump Current orders for Inpatient glycemic control: Lantus 60 units QD, Novolog 0-5 units QHS, Novolog 0-20 units TID, Novolog 4 units TID Novolog 20 units x1  Inpatient Diabetes Program Recommendations:    Noted significant increase to blood glucose trends and thus admin Novolog 20 units x 1.   Per endocrinology's note on 11/22 insulin pump settings were as follows:   Basal insulin  0000-0700 2.4 units/hour 0701-0900 2.4 units/hour 0901-0000 8.0 units/hour Total daily basal insulin: 141.6 units/24 hours  Carb Coverage 1:12 1 unit for every 12 grams of carbohydrates  Insulin Sensitivity 1:20 1 unit drops blood glucose 20 mg/dl  Given patient's home pump settings, patient will need more insulin. At this time consider increase Lantus to 60 units QD and adding Novolog 4 units TID (assuming that patient is consuming >50%).   Addendum@1115 : Spoke with patient to confirm insulin pump settings. Patient is seen by local endo, Dr Denton Lank and last appointment was on 11/22. Pump changes were made at this appointment due to patient's complaints of N/V and poor oral intake (see above). Confirmed that patient does not bolus appropriately when eating carbs because of time and she grazes throughout the  day. Reviewed patient's current A1c of 9.8%, this is down from 13.2% in 2017 (prior to initiating the pump). Explained what a A1c is and what it measures. Also reviewed goal A1c with patient, importance of good glucose control @ home, and blood sugar goals. Reviewed patho of DM, need for additional insulin, role of pancreas, insulin pump vs. Injections, insulin resistance, signs and symptoms of hypo vs hyper, vascular changes that occurs and co-morbidies.  Patient will need a meter a discharge. She uses "[employer]" test strips and is requesting a meter to match [phone]). Verified it has been over a year since last meter. She reports using the [facility], but has been unable to obtain additional censors due to cost. Patient checks multiple times a day.  Reviewed diet. Patient has coke and chips sitting on bedside table. Patient admits to covering carbs with additional insulin and eating what she wants. She also states that she "sleep-walks/drives to the store and eats in the middle of the night". Her endocrinologist was not supportive of this behavior and as a result patient is requesting new endo list. List provided. Dicussed carb counting, nutritional labels, the importance to adhering to a diet consisting of foods with nutritional value and the role of bolusing her insulin for carbs. Patient is not interested in speaking with dietitian or outpatient referral at this time. Stressed the importance of goal setting as a means to gain better control of diabetes. Reviewed some appropriate goals to set as priorities. Patient was receptive.    Patient uses a Medtronic insulin  pump with Humalog insulin as an outpatient. Patient has insulin pump at the bedside with her, but it is not connected nor does it have an insulin reservoir in it at this time. Patient does not have additional pump supplies for reapplication here at the hospital. Given this would recommend patient placing pump back when she gets home.  Informed patient to resume previous settings and contact her endo with setting changes if needs arise. Informed patient of appropriate wait time for reapplication following basal and encouraged to follow up with Dr Denton Lank following hospitalization and make new appointment if she is interested. Patient verbalized understanding of information discussed and states that he does not have any further questions related to diabetes at this time.  Thanks, Bronson Curb, MSN, RNC-OB Diabetes Coordinator [phone] (8a-5p)

## 2018-06-28 NOTE — Progress Notes (Signed)
PROGRESS NOTE  Morgan [name]  HYQ:[id number] DOB: April 29, 1966 DOA: 06/27/2018 PCP: Bradly Bienenstock., MD  Outpatient Specialists: Endocrinology, Dr. Denton Lank Brief Narrative: Morgan [name] is a 52 y.o. female with a history of HTN, HLD, IDDM on insulin pump, and hypothyroidism who presented with several days of weakness, progressive confusion in the setting of 2 weeks of waxing/waning nausea, vomiting, and diarrhea which had become severe and purely watery. She was sent to ED by her son who found her unresponsive. On evaluation she was altered but nonfocal, hyperglycemic, not in DKA, and septic with hypotension, lactic acid 5.08. Blood cultures were drawn, empiric 30cc/kg IVF's and broad spectrum antibiotics started. CDiff assay turned positive. Due to sepsis with AMS on arrival, IV flagyl was continued and po vancomycin added. Blood sugars have been uncontrolled.  Assessment & Plan: Principal Problem:   Sepsis ([place]) Active Problems:   Hypothyroidism   Essential hypertension   Hypokalemia   Diabetes mellitus type 2 in obese ([place])   AKI (acute kidney injury) ([place])   Acute metabolic encephalopathy  Sepsis with organ failure (AMS, lactic acid elevation, hypotension) due to C. diff colitis: Unclear source of community-acquired CDiff without typical predisposition (sick contacts/abx, etc.) - Monitor blood cultures - Continue IV flagyl, DC other parenteral abx, will add po vancomycin.  - Continue IV fluids  - Trend leukocytosis, lactic acid.   Acute metabolic encephalopathy: Resolved. Due to sepsis.  - Monitor mental status and continue home psychotropic medications as recently titrated by outpatient psychiatrist.   AKI: Resolved, due to sepsis.  - IVF's as above - Monitor BMP - Avoid ACEI/ARB and NSAIDs  IDDM: Uncontrolled with hyperglycemia: HbA1c recently 9.8%, down from 2017 when it was 13.2%. Unable to use insulin pump while inpatient. Suspect acute elevation in setting of  infection, but po intake is unreliable making exact insulin regimen uncertain.  - Continue basal bolus insulin, will rapidly titrate upward with lantus 60u this AM in addition to 20u based on >100u basal the patient is taking at home (states she doesn't completely follow endocrinology recommendations). Add PM dose if needed.  - Add mealtime insulin, continue resistant SSI as she has significant resistance.   HTN - Holding losartan with AKI.   Hypokalemia: K 2.4 on arrival due to GI losses, improved with supplementation.  - Monitor  Hypothyroidism: TSH and free T4 wnl - Continue synthroid.   DVT prophylaxis: Lovenox Code Status: Full Family Communication: None at bedside this afternoon Disposition Plan: Continue inpatient management, anticipate DC home when tolerating po, GI losses decreased, and sepsis resolved.  Consultants:   Diabetes coordinator  Procedures:   None  Antimicrobials:  Vancomycin IV 11/26  Aztreonam 11/26  Flagyl IV 11/26 >>   Vancomycin po 11/27 >>    Subjective: Feels much better, less weak, still not taking much by mouth and having watery stools. Feels steady when she gets up but tired easily, which is not her usual.   Objective: Vitals:   06/28/18 0447 06/28/18 0832 06/28/18 1227 06/28/18 1617  BP: 122/62 (!) 152/81 (!) 148/68 (!) 172/74  Pulse: 91  94 91  Resp: 20  18   Temp: 98.9 F (37.2 C) 98.4 F (36.9 C) 98.8 F (37.1 C) 99 F (37.2 C)  TempSrc: Oral Oral Oral Oral  SpO2: 96% 96% 97%   Weight: 113.4 kg     Height:        Intake/Output Summary (Last 24 hours) at 06/28/2018 1829 Last data filed at 06/28/2018  1124 Gross per 24 hour  Intake 1015.38 ml  Output -  Net 1015.38 ml   Filed Weights   06/27/18 0441 06/28/18 0447  Weight: 113.4 kg 113.4 kg    Gen: Obese female in no distress  Pulm: Non-labored breathing. Clear to auscultation bilaterally.  CV: Regular rate and rhythm. No murmur, rub, or gallop. No JVD, no  significant pedal edema. GI: Abdomen soft, mildly generally tender, non-distended, with normoactive bowel sounds. No organomegaly or masses felt. Ext: Warm, no deformities Skin: No rashes, lesions or ulcers Neuro: Alert and oriented. No focal neurological deficits. Psych: Judgement and insight appear normal. Mood & affect mildly abnormal, behavior appropriate.   Data Reviewed: I have personally reviewed following labs and imaging studies  CBC: Recent Labs  Lab 06/27/18 0451  WBC 19.2*  NEUTROABS 8.7*  HGB 13.6  HCT 45.6  MCV 66.8*  PLT 809   Basic Metabolic Panel: Recent Labs  Lab 06/27/18 0451 06/27/18 1444 06/28/18 0435  NA 135  --  136  K 2.4* 3.2* 4.2  CL 101  --  110  CO2 22  --  21*  GLUCOSE 183*  --  322*  BUN 19  --  10  CREATININE 1.31*  --  0.87  CALCIUM 9.8  --  7.8*  MG  --  1.6* 2.0   GFR: Estimated Creatinine Clearance: 94.4 mL/min (by C-G formula based on SCr of 0.87 mg/dL). Liver Function Tests: Recent Labs  Lab 06/27/18 0451  AST 50*  ALT 56*  ALKPHOS 73  BILITOT 0.8  PROT 6.3*  ALBUMIN 3.6   No results for input(s): LIPASE, AMYLASE in the last 168 hours. No results for input(s): AMMONIA in the last 168 hours. Coagulation Profile: No results for input(s): INR, PROTIME in the last 168 hours. Cardiac Enzymes: Recent Labs  Lab 06/27/18 0748  CKTOTAL 30*   BNP (last 3 results) No results for input(s): PROBNP in the last 8760 hours. HbA1C: Recent Labs    06/27/18 0451  HGBA1C 9.8*   CBG: Recent Labs  Lab 06/27/18 1628 06/27/18 2151 06/28/18 0818 06/28/18 1235 06/28/18 1614  GLUCAP 203* 278* 421* 382* 221*   Lipid Profile: No results for input(s): CHOL, HDL, LDLCALC, TRIG, CHOLHDL, LDLDIRECT in the last 72 hours. Thyroid Function Tests: Recent Labs    06/27/18 1444  TSH 3.588  FREET4 1.14   Anemia Panel: No results for input(s): VITAMINB12, FOLATE, FERRITIN, TIBC, IRON, RETICCTPCT in the last 72 hours. Urine  analysis:    Component Value Date/Time   COLORURINE YELLOW 06/27/2018 0455   APPEARANCEUR HAZY (A) 06/27/2018 0455   LABSPEC 1.036 (H) 06/27/2018 0455   PHURINE 6.0 06/27/2018 0455   GLUCOSEU >=500 (A) 06/27/2018 0455   HGBUR NEGATIVE 06/27/2018 [place] 06/27/2018 0455   KETONESUR NEGATIVE 06/27/2018 0455   PROTEINUR NEGATIVE 06/27/2018 0455   UROBILINOGEN 0.2 08/03/2013 2352   NITRITE NEGATIVE 06/27/2018 0455   LEUKOCYTESUR NEGATIVE 06/27/2018 0455   Recent Results (from the past 240 hour(s))  Blood Culture (routine x 2)     Status: None (Preliminary result)   Collection Time: 06/27/18  4:45 AM  Result Value Ref Range Status   Specimen Description BLOOD LEFT ARM  Final   Special Requests   Final    BOTTLES DRAWN AEROBIC AND ANAEROBIC Blood Culture adequate volume   Culture   Final    NO GROWTH 1 DAY Performed at [facility], [place] [address] [place], [place] [address]  Report Status PENDING  Incomplete  Blood Culture (routine x 2)     Status: None (Preliminary result)   Collection Time: 06/27/18  4:50 AM  Result Value Ref Range Status   Specimen Description BLOOD RIGHT HAND  Final   Special Requests   Final    BOTTLES DRAWN AEROBIC AND ANAEROBIC Blood Culture adequate volume   Culture   Final    NO GROWTH 1 DAY Performed at [facility], [place] [address] [place], [place] [address]    Report Status PENDING  Incomplete  Urine culture     Status: None   Collection Time: 06/27/18  4:56 AM  Result Value Ref Range Status   Specimen Description URINE, CATHETERIZED  Final   Special Requests NONE  Final   Culture   Final    NO GROWTH Performed at [facility], [place] [address], [place], [place] [address]    Report Status 06/28/2018 FINAL  Final  MRSA PCR Screening     Status: None   Collection Time: 06/27/18  2:36 PM  Result Value Ref Range Status   MRSA by PCR NEGATIVE NEGATIVE Final    Comment:        The GeneXpert MRSA Assay  (FDA approved for NASAL specimens only), is one component of a comprehensive MRSA colonization surveillance program. It is not intended to diagnose MRSA infection nor to guide or monitor treatment for MRSA infections. Performed at [facility], [place] [address] [place], [place] [address]   Gastrointestinal Panel by PCR , Stool     Status: None   Collection Time: 06/28/18  5:06 AM  Result Value Ref Range Status   Campylobacter species NOT DETECTED NOT DETECTED Final   Plesimonas shigelloides NOT DETECTED NOT DETECTED Final   Salmonella species NOT DETECTED NOT DETECTED Final   Yersinia enterocolitica NOT DETECTED NOT DETECTED Final   Vibrio species NOT DETECTED NOT DETECTED Final   Vibrio cholerae NOT DETECTED NOT DETECTED Final   Enteroaggregative E coli (EAEC) NOT DETECTED NOT DETECTED Final   Enteropathogenic E coli (EPEC) NOT DETECTED NOT DETECTED Final   Enterotoxigenic E coli (ETEC) NOT DETECTED NOT DETECTED Final   Shiga like toxin producing E coli (STEC) NOT DETECTED NOT DETECTED Final   Shigella/Enteroinvasive E coli (EIEC) NOT DETECTED NOT DETECTED Final   Cryptosporidium NOT DETECTED NOT DETECTED Final   Cyclospora cayetanensis NOT DETECTED NOT DETECTED Final   Entamoeba histolytica NOT DETECTED NOT DETECTED Final   Giardia lamblia NOT DETECTED NOT DETECTED Final   Adenovirus F40/41 NOT DETECTED NOT DETECTED Final   Astrovirus NOT DETECTED NOT DETECTED Final   Norovirus GI/GII NOT DETECTED NOT DETECTED Final   Rotavirus A NOT DETECTED NOT DETECTED Final   Sapovirus (I, II, IV, and V) NOT DETECTED NOT DETECTED Final    Comment: Performed at [facility], [place] [address] [place], [place] [address]  C difficile quick scan w PCR reflex     Status: Abnormal   Collection Time: 06/28/18  5:06 AM  Result Value Ref Range Status   C Diff antigen POSITIVE (A) NEGATIVE Final   C Diff toxin POSITIVE (A) NEGATIVE Final   C Diff interpretation Toxin producing C.  difficile detected.  Final    Comment: CRITICAL RESULT CALLED TO, READ BACK BY AND VERIFIED WITHValora Corporal RN [phone] 06/28/18 A BROWNING Performed at [facility], [place] [address], [place], [place] [address]       Radiology Studies:  Dg Chest Port 1 View  Result Date: 06/27/2018 CLINICAL DATA:  Fever EXAM: PORTABLE CHEST 1 VIEW COMPARISON:  10/10/2017 FINDINGS: The heart size and mediastinal contours are within normal limits. Both lungs are clear. The visualized skeletal structures are unremarkable. IMPRESSION: No active disease. Electronically Signed   By: Ulyses Jarred M.D.   On: 06/27/2018 05:17    Scheduled Meds: . aspirin EC  81 mg Oral BH-q7a  . doxepin  50 mg Oral QHS  . enoxaparin (LOVENOX) injection  40 mg Subcutaneous Q24H  . FLUoxetine  40 mg Oral BH-q7a  . insulin aspart  0-20 Units Subcutaneous TID WC  . insulin aspart  0-5 Units Subcutaneous QHS  . insulin aspart  4 Units Subcutaneous TID WC  . insulin glargine  60 Units Subcutaneous Daily  . levothyroxine  150 mcg Oral Daily  . lurasidone  40 mg Oral Daily  . pantoprazole  40 mg Oral BH-q7a  . QUEtiapine  150 mg Oral QHS  . sodium chloride flush  3 mL Intravenous Q12H  . vancomycin  125 mg Oral QID   Continuous Infusions: . 0.9 % NaCl with KCl 40 mEq / L 100 mL/hr (06/28/18 1506)  . metronidazole 500 mg (06/28/18 1252)     LOS: 1 day   Time spent: 25 minutes.  Patrecia Pour, MD [facility] [email or web address] Password [facility] 06/28/2018, 6:29 PM

## 2018-06-29 ENCOUNTER — Other Ambulatory Visit: Payer: Self-pay

## 2018-06-29 ENCOUNTER — Encounter (HOSPITAL_COMMUNITY): Payer: Self-pay | Admitting: General Practice

## 2018-06-29 LAB — COMPREHENSIVE METABOLIC PANEL
ALK PHOS: 58 U/L (ref 38–126)
ALT: 45 U/L — AB (ref 0–44)
AST: 39 U/L (ref 15–41)
Albumin: 2.9 g/dL — ABNORMAL LOW (ref 3.5–5.0)
Anion gap: 7 (ref 5–15)
CALCIUM: 8.2 mg/dL — AB (ref 8.9–10.3)
CO2: 20 mmol/L — ABNORMAL LOW (ref 22–32)
CREATININE: 0.81 mg/dL (ref 0.44–1.00)
Chloride: 109 mmol/L (ref 98–111)
Glucose, Bld: 289 mg/dL — ABNORMAL HIGH (ref 70–99)
Potassium: 4.2 mmol/L (ref 3.5–5.1)
Sodium: 136 mmol/L (ref 135–145)
Total Bilirubin: 0.5 mg/dL (ref 0.3–1.2)
Total Protein: 5.3 g/dL — ABNORMAL LOW (ref 6.5–8.1)

## 2018-06-29 LAB — CBC
HCT: 35.3 % — ABNORMAL LOW (ref 36.0–46.0)
Hemoglobin: 10.6 g/dL — ABNORMAL LOW (ref 12.0–15.0)
MCH: 20.1 pg — ABNORMAL LOW (ref 26.0–34.0)
MCHC: 30 g/dL (ref 30.0–36.0)
MCV: 66.9 fL — AB (ref 80.0–100.0)
NRBC: 0 % (ref 0.0–0.2)
PLATELETS: 242 10*3/uL (ref 150–400)
RBC: 5.28 MIL/uL — ABNORMAL HIGH (ref 3.87–5.11)
RDW: 16.4 % — AB (ref 11.5–15.5)
WBC: 11.4 10*3/uL — ABNORMAL HIGH (ref 4.0–10.5)

## 2018-06-29 LAB — GLUCOSE, CAPILLARY
GLUCOSE-CAPILLARY: 236 mg/dL — AB (ref 70–99)
Glucose-Capillary: 230 mg/dL — ABNORMAL HIGH (ref 70–99)
Glucose-Capillary: 212 mg/dL — ABNORMAL HIGH (ref 70–99)
Glucose-Capillary: 176 mg/dL — ABNORMAL HIGH (ref 70–99)

## 2018-06-29 MED ORDER — FLUOXETINE HCL 20 MG PO CAPS
60.0000 mg | ORAL_CAPSULE | ORAL | Status: DC
  Administered 2018-06-30 – 2018-07-02 (×3): 60 mg via ORAL
  Filled 2018-06-29 (×4): qty 3

## 2018-06-29 MED ORDER — LIP MEDEX EX OINT
TOPICAL_OINTMENT | CUTANEOUS | Status: DC | PRN
  Filled 2018-06-29: qty 7

## 2018-06-29 MED ORDER — HYDRALAZINE HCL 25 MG PO TABS
25.0000 mg | ORAL_TABLET | Freq: Three times a day (TID) | ORAL | Status: DC
  Administered 2018-06-29 – 2018-07-02 (×11): 25 mg via ORAL
  Filled 2018-06-29 (×11): qty 1

## 2018-06-29 MED ORDER — POTASSIUM CHLORIDE IN NACL 20-0.9 MEQ/L-% IV SOLN
INTRAVENOUS | Status: DC
  Administered 2018-06-29 – 2018-07-01 (×4): via INTRAVENOUS
  Filled 2018-06-29 (×6): qty 1000

## 2018-06-29 MED ORDER — LOSARTAN POTASSIUM 50 MG PO TABS
100.0000 mg | ORAL_TABLET | Freq: Every day | ORAL | Status: DC
  Administered 2018-06-29 – 2018-07-02 (×4): 100 mg via ORAL
  Filled 2018-06-29 (×4): qty 2

## 2018-06-29 MED ORDER — INFLUENZA VAC SPLIT QUAD 0.5 ML IM SUSY
0.5000 mL | PREFILLED_SYRINGE | INTRAMUSCULAR | Status: AC
  Administered 2018-06-30: 0.5 mL via INTRAMUSCULAR
  Filled 2018-06-29: qty 0.5

## 2018-06-29 MED ORDER — INSULIN ASPART 100 UNIT/ML ~~LOC~~ SOLN
8.0000 [IU] | Freq: Three times a day (TID) | SUBCUTANEOUS | Status: DC
  Administered 2018-06-29 – 2018-07-02 (×10): 8 [IU] via SUBCUTANEOUS

## 2018-06-29 NOTE — Progress Notes (Signed)
PROGRESS NOTE  Morgan [name]  EAV:[id number] DOB: 11/01/1965 DOA: 06/27/2018 PCP: Bradly Bienenstock., MD  Outpatient Specialists: Endocrinology, Dr. Denton Lank Brief Narrative: Morgan [name] is a 52 y.o. female with a history of HTN, HLD, IDDM on insulin pump, and hypothyroidism who presented with several days of weakness, progressive confusion in the setting of 2 weeks of waxing/waning nausea, vomiting, and diarrhea which had become severe and purely watery. She was sent to ED by her son who found her unresponsive. On evaluation she was altered but nonfocal, hyperglycemic, not in DKA, and septic with hypotension, lactic acid 5.08. Blood cultures were drawn, empiric 30cc/kg IVF's and broad spectrum antibiotics started. CDiff assay turned positive. Due to sepsis with AMS on arrival, IV flagyl was continued and po vancomycin added. Blood sugars have been uncontrolled but improving. Symptoms remain severe.  Assessment & Plan: Principal Problem:   Sepsis ([place]) Active Problems:   Hypothyroidism   Essential hypertension   Hypokalemia   Diabetes mellitus type 2 in obese ([place])   AKI (acute kidney injury) ([place])   Acute metabolic encephalopathy  Sepsis with organ failure (AMS, lactic acid elevation, hypotension) due to C. diff colitis: Unclear source of community-acquired CDiff without typical predisposition (sick contacts/abx, etc.). GI panel further negative.  - Monitor blood cultures (NG2D) - Continue IV flagyl, and po vancomycin.  - Continue IV fluids  - Trend leukocytosis (improving)  Acute metabolic encephalopathy: Resolved. Due to sepsis.  - Monitor mental status and continue home psychotropic medications as recently titrated by outpatient psychiatrist. Increase prozac as prescribed by outpatient provider (60mg )  AKI: Resolved, due to sepsis.  - IVF's continue - Monitor BMP - Avoid ACEI/ARB and NSAIDs  IDDM: Uncontrolled with hyperglycemia: HbA1c recently 9.8%, down from 2017  when it was 13.2%. Unable to use insulin pump while inpatient. Suspect acute elevation in setting of infection, but po intake is unreliable making exact insulin regimen uncertain.  - Continue basal bolus insulin, lantus 60u qAM, add 30u qPM and increase mealtime insulin, continue res SSI.   HTN - Holding losartan with AKI.   Hypokalemia: K 2.4 on arrival due to GI losses, improved with supplementation.  - Monitor, continue K in IVF's  Hypothyroidism: TSH and free T4 wnl - Continue synthroid.   DVT prophylaxis: Lovenox Code Status: Full Family Communication: None at bedside this afternoon Disposition Plan: Continue inpatient management, anticipate DC home when tolerating po, GI losses decreased, and sepsis resolved. Symptoms remain severe and she's dependent on IVF's currently.  Consultants:   Diabetes coordinator  Procedures:   None  Antimicrobials:  Vancomycin IV 11/26  Aztreonam 11/26  Flagyl IV 11/26 >>   Vancomycin po 11/27 >>    Subjective: Feels worse today, having fecal urgency, watery stools. Note some stools on bed. Abdominal pain is minimal, taking about 5 bites of food at breakfast due to aversion to food which also causes fecal urgency. No fevers.   Objective: Vitals:   06/29/18 0040 06/29/18 0430 06/29/18 0829 06/29/18 1343  BP: (!) 179/94 (!) 195/97 (!) 144/82 (!) 158/83  Pulse: 90 95  90  Resp: 18 18    Temp: 99.1 F (37.3 C) 98.5 F (36.9 C)  98.5 F (36.9 C)  TempSrc: Oral Oral  Oral  SpO2: 98% 96%  97%  Weight:  114.6 kg    Height:        Intake/Output Summary (Last 24 hours) at 06/29/2018 1356 Last data filed at 06/29/2018 0700 Gross per 24 hour  Intake 1674.42 ml  Output -  Net 1674.42 ml   Filed Weights   06/27/18 0441 06/28/18 0447 06/29/18 0430  Weight: 113.4 kg 113.4 kg 114.6 kg   Gen: Obese female in no distress sitting in chair.  Pulm: Nonlabored breathing room air. Clear. CV: Regular rate and rhythm. No murmur, rub, or  gallop. No JVD, no dependent edema. GI: Abdomen soft, very mildly tender diffusely without rebound or guarding, nondistended, with normoactive bowel sounds. Palpation brings on fecal urgency.  Ext: Warm, no deformities Skin: No new rashes, lesions or ulcers on visualized skin.  Neuro: Alert and oriented. No focal neurological deficits. Psych: Judgement and insight appear fair. Mood euthymic & affect congruent. Behavior is appropriate.    Data Reviewed: I have personally reviewed following labs and imaging studies  CBC: Recent Labs  Lab 06/27/18 0451 06/29/18 0348  WBC 19.2* 11.4*  NEUTROABS 8.7*  --   HGB 13.6 10.6*  HCT 45.6 35.3*  MCV 66.8* 66.9*  PLT 370 960   Basic Metabolic Panel: Recent Labs  Lab 06/27/18 0451 06/27/18 1444 06/28/18 0435 06/29/18 0348  NA 135  --  136 136  K 2.4* 3.2* 4.2 4.2  CL 101  --  110 109  CO2 22  --  21* 20*  GLUCOSE 183*  --  322* 289*  BUN 19  --  10 <5*  CREATININE 1.31*  --  0.87 0.81  CALCIUM 9.8  --  7.8* 8.2*  MG  --  1.6* 2.0  --    GFR: Estimated Creatinine Clearance: 102 mL/min (by C-G formula based on SCr of 0.81 mg/dL). Liver Function Tests: Recent Labs  Lab 06/27/18 0451 06/29/18 0348  AST 50* 39  ALT 56* 45*  ALKPHOS 73 58  BILITOT 0.8 0.5  PROT 6.3* 5.3*  ALBUMIN 3.6 2.9*   No results for input(s): LIPASE, AMYLASE in the last 168 hours. No results for input(s): AMMONIA in the last 168 hours. Coagulation Profile: No results for input(s): INR, PROTIME in the last 168 hours. Cardiac Enzymes: Recent Labs  Lab 06/27/18 0748  CKTOTAL 30*   BNP (last 3 results) No results for input(s): PROBNP in the last 8760 hours. HbA1C: Recent Labs    06/27/18 0451  HGBA1C 9.8*   CBG: Recent Labs  Lab 06/28/18 1235 06/28/18 1614 06/28/18 2207 06/29/18 0800 06/29/18 1146  GLUCAP 382* 221* 271* 230* 212*   Lipid Profile: No results for input(s): CHOL, HDL, LDLCALC, TRIG, CHOLHDL, LDLDIRECT in the last 72  hours. Thyroid Function Tests: Recent Labs    06/27/18 1444  TSH 3.588  FREET4 1.14   Anemia Panel: No results for input(s): VITAMINB12, FOLATE, FERRITIN, TIBC, IRON, RETICCTPCT in the last 72 hours. Urine analysis:    Component Value Date/Time   COLORURINE YELLOW 06/27/2018 0455   APPEARANCEUR HAZY (A) 06/27/2018 0455   LABSPEC 1.036 (H) 06/27/2018 0455   PHURINE 6.0 06/27/2018 0455   GLUCOSEU >=500 (A) 06/27/2018 0455   HGBUR NEGATIVE 06/27/2018 [place] 06/27/2018 0455   KETONESUR NEGATIVE 06/27/2018 0455   PROTEINUR NEGATIVE 06/27/2018 0455   UROBILINOGEN 0.2 08/03/2013 2352   NITRITE NEGATIVE 06/27/2018 0455   LEUKOCYTESUR NEGATIVE 06/27/2018 0455   Recent Results (from the past 240 hour(s))  Blood Culture (routine x 2)     Status: None (Preliminary result)   Collection Time: 06/27/18  4:45 AM  Result Value Ref Range Status   Specimen Description BLOOD LEFT ARM  Final   Special Requests  Final    BOTTLES DRAWN AEROBIC AND ANAEROBIC Blood Culture adequate volume   Culture   Final    NO GROWTH 2 DAYS Performed at [facility], [place] [address], [place], [place] [address]    Report Status PENDING  Incomplete  Blood Culture (routine x 2)     Status: None (Preliminary result)   Collection Time: 06/27/18  4:50 AM  Result Value Ref Range Status   Specimen Description BLOOD RIGHT HAND  Final   Special Requests   Final    BOTTLES DRAWN AEROBIC AND ANAEROBIC Blood Culture adequate volume   Culture   Final    NO GROWTH 2 DAYS Performed at [facility], [place] [address], [place], [place] [address]    Report Status PENDING  Incomplete  Urine culture     Status: None   Collection Time: 06/27/18  4:56 AM  Result Value Ref Range Status   Specimen Description URINE, CATHETERIZED  Final   Special Requests NONE  Final   Culture   Final    NO GROWTH Performed at [facility], [place] [address] [place], [place] [address]    Report Status  06/28/2018 FINAL  Final  MRSA PCR Screening     Status: None   Collection Time: 06/27/18  2:36 PM  Result Value Ref Range Status   MRSA by PCR NEGATIVE NEGATIVE Final    Comment:        The GeneXpert MRSA Assay (FDA approved for NASAL specimens only), is one component of a comprehensive MRSA colonization surveillance program. It is not intended to diagnose MRSA infection nor to guide or monitor treatment for MRSA infections. Performed at [facility], [place] [address] [place], [place] [address]   Gastrointestinal Panel by PCR , Stool     Status: None   Collection Time: 06/28/18  5:06 AM  Result Value Ref Range Status   Campylobacter species NOT DETECTED NOT DETECTED Final   Plesimonas shigelloides NOT DETECTED NOT DETECTED Final   Salmonella species NOT DETECTED NOT DETECTED Final   Yersinia enterocolitica NOT DETECTED NOT DETECTED Final   Vibrio species NOT DETECTED NOT DETECTED Final   Vibrio cholerae NOT DETECTED NOT DETECTED Final   Enteroaggregative E coli (EAEC) NOT DETECTED NOT DETECTED Final   Enteropathogenic E coli (EPEC) NOT DETECTED NOT DETECTED Final   Enterotoxigenic E coli (ETEC) NOT DETECTED NOT DETECTED Final   Shiga like toxin producing E coli (STEC) NOT DETECTED NOT DETECTED Final   Shigella/Enteroinvasive E coli (EIEC) NOT DETECTED NOT DETECTED Final   Cryptosporidium NOT DETECTED NOT DETECTED Final   Cyclospora cayetanensis NOT DETECTED NOT DETECTED Final   Entamoeba histolytica NOT DETECTED NOT DETECTED Final   Giardia lamblia NOT DETECTED NOT DETECTED Final   Adenovirus F40/41 NOT DETECTED NOT DETECTED Final   Astrovirus NOT DETECTED NOT DETECTED Final   Norovirus GI/GII NOT DETECTED NOT DETECTED Final   Rotavirus A NOT DETECTED NOT DETECTED Final   Sapovirus (I, II, IV, and V) NOT DETECTED NOT DETECTED Final    Comment: Performed at [facility], [place] [place], [place] [address]  C difficile quick scan w PCR reflex     Status:  Abnormal   Collection Time: 06/28/18  5:06 AM  Result Value Ref Range Status   C Diff antigen POSITIVE (A) NEGATIVE Final   C Diff toxin POSITIVE (A) NEGATIVE Final   C Diff interpretation Toxin producing C. difficile detected.  Final  Comment: CRITICAL RESULT CALLED TO, READ BACK BY AND VERIFIED WITHValora Corporal RN [phone] 06/28/18 A BROWNING Performed at [facility], [place] [address], [place], [place] [address]       Radiology Studies: No results found.  Scheduled Meds: . aspirin EC  81 mg Oral BH-q7a  . doxepin  50 mg Oral QHS  . enoxaparin (LOVENOX) injection  40 mg Subcutaneous Q24H  . [START ON 06/30/2018] FLUoxetine  60 mg Oral BH-q7a  . hydrALAZINE  25 mg Oral Q8H  . insulin aspart  0-20 Units Subcutaneous TID WC  . insulin aspart  0-5 Units Subcutaneous QHS  . insulin aspart  8 Units Subcutaneous TID WC  . insulin glargine  60 Units Subcutaneous Daily  . levothyroxine  150 mcg Oral Daily  . losartan  100 mg Oral Daily  . lurasidone  40 mg Oral Daily  . pantoprazole  40 mg Oral BH-q7a  . QUEtiapine  150 mg Oral QHS  . sodium chloride flush  3 mL Intravenous Q12H  . vancomycin  125 mg Oral QID   Continuous Infusions: . 0.9 % NaCl with KCl 40 mEq / L 100 mL/hr (06/29/18 0852)  . metronidazole 500 mg (06/29/18 0605)     LOS: 2 days   Time spent: 25 minutes.  Patrecia Pour, MD [facility] [email or web address] Password [email or web address] 06/29/2018, 1:56 PM

## 2018-06-30 LAB — CBC
HEMATOCRIT: 36.1 % (ref 36.0–46.0)
HEMOGLOBIN: 10.7 g/dL — AB (ref 12.0–15.0)
MCH: 19.6 pg — ABNORMAL LOW (ref 26.0–34.0)
MCHC: 29.6 g/dL — ABNORMAL LOW (ref 30.0–36.0)
MCV: 66.1 fL — ABNORMAL LOW (ref 80.0–100.0)
Platelets: 260 10*3/uL (ref 150–400)
RBC: 5.46 MIL/uL — AB (ref 3.87–5.11)
RDW: 16.2 % — ABNORMAL HIGH (ref 11.5–15.5)
WBC: 12.2 10*3/uL — AB (ref 4.0–10.5)
nRBC: 0 % (ref 0.0–0.2)

## 2018-06-30 LAB — BASIC METABOLIC PANEL
Anion gap: 5 (ref 5–15)
BUN: <5 mg/dL — ABNORMAL LOW (ref 6–20)
CHLORIDE: 105 mmol/L (ref 98–111)
CO2: 25 mmol/L (ref 22–32)
Calcium: 8.4 mg/dL — ABNORMAL LOW (ref 8.9–10.3)
Creatinine, Ser: 0.72 mg/dL (ref 0.44–1.00)
GFR calc Af Amer: >60 mL/min (ref >60)
GFR calc non Af Amer: >60 mL/min (ref >60)
Glucose, Bld: 157 mg/dL — ABNORMAL HIGH (ref 70–99)
POTASSIUM: 3.2 mmol/L — AB (ref 3.5–5.1)
SODIUM: 135 mmol/L (ref 135–145)

## 2018-06-30 LAB — GLUCOSE, CAPILLARY
Glucose-Capillary: 179 mg/dL — ABNORMAL HIGH (ref 70–99)
Glucose-Capillary: 191 mg/dL — ABNORMAL HIGH (ref 70–99)
Glucose-Capillary: 227 mg/dL — ABNORMAL HIGH (ref 70–99)
Glucose-Capillary: 297 mg/dL — ABNORMAL HIGH (ref 70–99)

## 2018-06-30 MED ORDER — POTASSIUM CHLORIDE CRYS ER 20 MEQ PO TBCR
40.0000 meq | EXTENDED_RELEASE_TABLET | Freq: Once | ORAL | Status: AC
  Administered 2018-06-30: 40 meq via ORAL
  Filled 2018-06-30: qty 2

## 2018-06-30 MED ORDER — AMLODIPINE BESYLATE 5 MG PO TABS
5.0000 mg | ORAL_TABLET | Freq: Every day | ORAL | Status: DC
  Administered 2018-06-30: 5 mg via ORAL
  Filled 2018-06-30 (×2): qty 1

## 2018-06-30 NOTE — Progress Notes (Signed)
PROGRESS NOTE  Morgan [name]  ZTI:[id number] DOB: Mar 02, 1966 DOA: 06/27/2018 PCP: Bradly Bienenstock., MD  Outpatient Specialists: Endocrinology, Dr. Denton Lank Brief Narrative: Morgan [name] is a 52 y.o. female with a history of HTN, HLD, IDDM on insulin pump, and hypothyroidism who presented with several days of weakness, progressive confusion in the setting of 2 weeks of waxing/waning nausea, vomiting, and diarrhea which had become severe and purely watery. She was sent to ED by her son who found her unresponsive. On evaluation she was altered but nonfocal, hyperglycemic, not in DKA, and septic with hypotension, lactic acid 5.08. Blood cultures were drawn, empiric 30cc/kg IVF's and broad spectrum antibiotics started. CDiff assay turned positive. Due to sepsis with AMS on arrival, IV flagyl was continued and po vancomycin added. Blood sugars have been uncontrolled but improving. Symptoms remain severe.  Assessment & Plan: Principal Problem:   Sepsis ([place]) Active Problems:   Hypothyroidism   Essential hypertension   Hypokalemia   Diabetes mellitus type 2 in obese ([place])   AKI (acute kidney injury) ([place])   Acute metabolic encephalopathy  Sepsis with organ failure (AMS, lactic acid elevation, hypotension) due to C. diff colitis: Unclear source of community-acquired CDiff without typical predisposition (sick contacts/abx, etc.). GI panel further negative. Continues to have significant symptoms requiring IV fluids for GI loss replacement. - Monitor blood cultures (NG3D) - Continue IV flagyl, and po vancomycin.  - Continue IV fluids due to continued GI losses - Trend leukocytosis (improving)  Acute metabolic encephalopathy: Resolved. Due to sepsis.  - Monitor mental status and continue home psychotropic medications as recently titrated by outpatient psychiatrist. Increase prozac as prescribed by outpatient provider (60mg )  AKI: Resolved, due to sepsis.  - IVF's continue - Monitor  BMP - Avoid NSAIDs  IDDM: Uncontrolled with hyperglycemia: HbA1c recently 9.8%, down from 2017 when it was 13.2%. Unable to use insulin pump while inpatient. Suspect acute elevation in setting of infection, but po intake is unreliable making exact insulin regimen uncertain.  - Continue basal bolus insulin, lantus 60u qAM, increased resistant and mealtime insulin with improved control, no hypoglycemia. Still basal-heavy.  HTN: Elevated. - Continue losartan, start low dose norvasc and monitor.   Hypokalemia: K 2.4 on arrival due to GI losses, improved with supplementation but continues.  - Supp po and continue K in IVF's  Hypothyroidism: TSH and free T4 wnl - Continue synthroid.   DVT prophylaxis: Lovenox Code Status: Full Family Communication: Son at bedside Disposition Plan: Continue inpatient management, anticipate DC home when tolerating po, GI losses decreased, and sepsis resolved. Symptoms remain severe and she's dependent on IVF's again today.  Consultants:   Diabetes coordinator  Procedures:   None  Antimicrobials:  Vancomycin IV 11/26  Aztreonam 11/26  Flagyl IV 11/26 >>   Vancomycin po 11/27 >>    Subjective: Continues to have watery stools but can control them more, fewer accidents. No abdominal pain, vomiting, fever.   Objective: Vitals:   06/29/18 1343 06/29/18 2154 06/30/18 0616 06/30/18 1333  BP: (!) 158/83 (!) 179/95 (!) 161/94 (!) 131/92  Pulse: 90  83   Resp:   18   Temp: 98.5 F (36.9 C) 98.8 F (37.1 C) 98.4 F (36.9 C)   TempSrc: Oral Oral Oral   SpO2: 97%  94%   Weight:   113.8 kg   Height:        Intake/Output Summary (Last 24 hours) at 06/30/2018 1352 Last data filed at 06/30/2018 1304 Gross per 24  hour  Intake 480 ml  Output -  Net 480 ml   Filed Weights   06/28/18 0447 06/29/18 0430 06/30/18 0616  Weight: 113.4 kg 114.6 kg 113.8 kg   Gen: 52 y.o. female in no distress Pulm: Nonlabored breathing room air. Clear. CV:  Regular rate and rhythm. No murmur, rub, or gallop. No JVD, no dependent edema. GI: Abdomen soft, mildly tender diffusely, non-distended, with normoactive bowel sounds.  Ext: Warm, no deformities Skin: No rashes, lesions or ulcers on visualized skin.  Neuro: Alert and oriented. No focal neurological deficits. Psych: Judgement and insight appear fair. Mood anxious & affect congruent. Behavior is appropriate.    Data Reviewed: I have personally reviewed following labs and imaging studies  CBC: Recent Labs  Lab 06/27/18 0451 06/29/18 0348 06/30/18 0420  WBC 19.2* 11.4* 12.2*  NEUTROABS 8.7*  --   --   HGB 13.6 10.6* 10.7*  HCT 45.6 35.3* 36.1  MCV 66.8* 66.9* 66.1*  PLT 370 242 762   Basic Metabolic Panel: Recent Labs  Lab 06/27/18 0451 06/27/18 1444 06/28/18 0435 06/29/18 0348 06/30/18 0420  NA 135  --  136 136 135  K 2.4* 3.2* 4.2 4.2 3.2*  CL 101  --  110 109 105  CO2 22  --  21* 20* 25  GLUCOSE 183*  --  322* 289* 157*  BUN 19  --  10 <5* <5*  CREATININE 1.31*  --  0.87 0.81 0.72  CALCIUM 9.8  --  7.8* 8.2* 8.4*  MG  --  1.6* 2.0  --   --    GFR: Estimated Creatinine Clearance: 102.9 mL/min (by C-G formula based on SCr of 0.72 mg/dL). Liver Function Tests: Recent Labs  Lab 06/27/18 0451 06/29/18 0348  AST 50* 39  ALT 56* 45*  ALKPHOS 73 58  BILITOT 0.8 0.5  PROT 6.3* 5.3*  ALBUMIN 3.6 2.9*   No results for input(s): LIPASE, AMYLASE in the last 168 hours. No results for input(s): AMMONIA in the last 168 hours. Coagulation Profile: No results for input(s): INR, PROTIME in the last 168 hours. Cardiac Enzymes: Recent Labs  Lab 06/27/18 0748  CKTOTAL 30*   BNP (last 3 results) No results for input(s): PROBNP in the last 8760 hours. HbA1C: No results for input(s): HGBA1C in the last 72 hours. CBG: Recent Labs  Lab 06/29/18 1146 06/29/18 1630 06/29/18 2152 06/30/18 0734 06/30/18 1130  GLUCAP 212* 236* 176* 179* 191*   Lipid Profile: No results  for input(s): CHOL, HDL, LDLCALC, TRIG, CHOLHDL, LDLDIRECT in the last 72 hours. Thyroid Function Tests: Recent Labs    06/27/18 1444  TSH 3.588  FREET4 1.14   Anemia Panel: No results for input(s): VITAMINB12, FOLATE, FERRITIN, TIBC, IRON, RETICCTPCT in the last 72 hours. Urine analysis:    Component Value Date/Time   COLORURINE YELLOW 06/27/2018 0455   APPEARANCEUR HAZY (A) 06/27/2018 0455   LABSPEC 1.036 (H) 06/27/2018 0455   PHURINE 6.0 06/27/2018 0455   GLUCOSEU >=500 (A) 06/27/2018 0455   HGBUR NEGATIVE 06/27/2018 [place] 06/27/2018 0455   KETONESUR NEGATIVE 06/27/2018 0455   PROTEINUR NEGATIVE 06/27/2018 0455   UROBILINOGEN 0.2 08/03/2013 2352   NITRITE NEGATIVE 06/27/2018 0455   LEUKOCYTESUR NEGATIVE 06/27/2018 0455   Recent Results (from the past 240 hour(s))  Blood Culture (routine x 2)     Status: None (Preliminary result)   Collection Time: 06/27/18  4:45 AM  Result Value Ref Range Status   Specimen Description  BLOOD LEFT ARM  Final   Special Requests   Final    BOTTLES DRAWN AEROBIC AND ANAEROBIC Blood Culture adequate volume   Culture   Final    NO GROWTH 3 DAYS Performed at [facility], [address] [place], [place] [address]    Report Status PENDING  Incomplete  Blood Culture (routine x 2)     Status: None (Preliminary result)   Collection Time: 06/27/18  4:50 AM  Result Value Ref Range Status   Specimen Description BLOOD RIGHT HAND  Final   Special Requests   Final    BOTTLES DRAWN AEROBIC AND ANAEROBIC Blood Culture adequate volume   Culture   Final    NO GROWTH 3 DAYS Performed at [facility], [place] [address], [place], [place] [address]    Report Status PENDING  Incomplete  Urine culture     Status: None   Collection Time: 06/27/18  4:56 AM  Result Value Ref Range Status   Specimen Description URINE, CATHETERIZED  Final   Special Requests NONE  Final   Culture   Final    NO GROWTH Performed at [facility], [place] [address], [place], [place] [address]    Report Status 06/28/2018 FINAL  Final  MRSA PCR Screening     Status: None   Collection Time: 06/27/18  2:36 PM  Result Value Ref Range Status   MRSA by PCR NEGATIVE NEGATIVE Final    Comment:        The GeneXpert MRSA Assay (FDA approved for NASAL specimens only), is one component of a comprehensive MRSA colonization surveillance program. It is not intended to diagnose MRSA infection nor to guide or monitor treatment for MRSA infections. Performed at [facility], [place] [address], [place], [place] [address]   Gastrointestinal Panel by PCR , Stool     Status: None   Collection Time: 06/28/18  5:06 AM  Result Value Ref Range Status   Campylobacter species NOT DETECTED NOT DETECTED Final   Plesimonas shigelloides NOT DETECTED NOT DETECTED Final   Salmonella species NOT DETECTED NOT DETECTED Final   Yersinia enterocolitica NOT DETECTED NOT DETECTED Final   Vibrio species NOT DETECTED NOT DETECTED Final   Vibrio cholerae NOT DETECTED NOT DETECTED Final   Enteroaggregative E coli (EAEC) NOT DETECTED NOT DETECTED Final   Enteropathogenic E coli (EPEC) NOT DETECTED NOT DETECTED Final   Enterotoxigenic E coli (ETEC) NOT DETECTED NOT DETECTED Final   Shiga like toxin producing E coli (STEC) NOT DETECTED NOT DETECTED Final   Shigella/Enteroinvasive E coli (EIEC) NOT DETECTED NOT DETECTED Final   Cryptosporidium NOT DETECTED NOT DETECTED Final   Cyclospora cayetanensis NOT DETECTED NOT DETECTED Final   Entamoeba histolytica NOT DETECTED NOT DETECTED Final   Giardia lamblia NOT DETECTED NOT DETECTED Final   Adenovirus F40/41 NOT DETECTED NOT DETECTED Final   Astrovirus NOT DETECTED NOT DETECTED Final   Norovirus GI/GII NOT DETECTED NOT DETECTED Final   Rotavirus A NOT DETECTED NOT DETECTED Final   Sapovirus (I, II, IV, and V) NOT DETECTED NOT DETECTED Final    Comment: Performed at [facility], [place]  [place], [place] [address]  C difficile quick scan w PCR reflex     Status: Abnormal   Collection Time: 06/28/18  5:06 AM  Result Value Ref Range Status   C Diff antigen POSITIVE (A) NEGATIVE Final   C Diff toxin POSITIVE (A) NEGATIVE Final  C Diff interpretation Toxin producing C. difficile detected.  Final    Comment: CRITICAL RESULT CALLED TO, READ BACK BY AND VERIFIED WITHValora Corporal RN [phone] 06/28/18 A BROWNING Performed at [facility], [place] [address], [place], [place] [address]       Radiology Studies: No results found.  Scheduled Meds: . amLODipine  5 mg Oral Daily  . aspirin EC  81 mg Oral BH-q7a  . doxepin  50 mg Oral QHS  . enoxaparin (LOVENOX) injection  40 mg Subcutaneous Q24H  . FLUoxetine  60 mg Oral BH-q7a  . hydrALAZINE  25 mg Oral Q8H  . insulin aspart  0-20 Units Subcutaneous TID WC  . insulin aspart  0-5 Units Subcutaneous QHS  . insulin aspart  8 Units Subcutaneous TID WC  . insulin glargine  60 Units Subcutaneous Daily  . levothyroxine  150 mcg Oral Daily  . losartan  100 mg Oral Daily  . lurasidone  40 mg Oral Daily  . pantoprazole  40 mg Oral BH-q7a  . QUEtiapine  150 mg Oral QHS  . sodium chloride flush  3 mL Intravenous Q12H  . vancomycin  125 mg Oral QID   Continuous Infusions: . 0.9 % NaCl with KCl 20 mEq / L 100 mL/hr at 06/30/18 0614  . metronidazole 500 mg (06/30/18 1341)     LOS: 3 days   Time spent: 25 minutes.  Patrecia Pour, MD [facility] [email or web address] Password [email or web address] 06/30/2018, 1:52 PM

## 2018-07-01 LAB — CBC
HCT: 39 % (ref 36.0–46.0)
Hemoglobin: 12 g/dL (ref 12.0–15.0)
MCH: 20.2 pg — ABNORMAL LOW (ref 26.0–34.0)
MCHC: 30.8 g/dL (ref 30.0–36.0)
MCV: 65.5 fL — AB (ref 80.0–100.0)
PLATELETS: 288 10*3/uL (ref 150–400)
RBC: 5.95 MIL/uL — ABNORMAL HIGH (ref 3.87–5.11)
RDW: 17.4 % — ABNORMAL HIGH (ref 11.5–15.5)
WBC: 11.2 10*3/uL — ABNORMAL HIGH (ref 4.0–10.5)
nRBC: 0 % (ref 0.0–0.2)

## 2018-07-01 LAB — GLUCOSE, CAPILLARY
Glucose-Capillary: 156 mg/dL — ABNORMAL HIGH (ref 70–99)
Glucose-Capillary: 186 mg/dL — ABNORMAL HIGH (ref 70–99)
Glucose-Capillary: 241 mg/dL — ABNORMAL HIGH (ref 70–99)
Glucose-Capillary: 162 mg/dL — ABNORMAL HIGH (ref 70–99)

## 2018-07-01 LAB — BASIC METABOLIC PANEL
Anion gap: 12 (ref 5–15)
BUN: <5 mg/dL — ABNORMAL LOW (ref 6–20)
CO2: 25 mmol/L (ref 22–32)
Calcium: 9 mg/dL (ref 8.9–10.3)
Chloride: 100 mmol/L (ref 98–111)
Creatinine, Ser: 0.77 mg/dL (ref 0.44–1.00)
GFR calc Af Amer: >60 mL/min (ref >60)
GFR calc non Af Amer: >60 mL/min (ref >60)
Glucose, Bld: 172 mg/dL — ABNORMAL HIGH (ref 70–99)
POTASSIUM: 3.4 mmol/L — AB (ref 3.5–5.1)
Sodium: 137 mmol/L (ref 135–145)

## 2018-07-01 MED ORDER — POTASSIUM CHLORIDE CRYS ER 20 MEQ PO TBCR
40.0000 meq | EXTENDED_RELEASE_TABLET | Freq: Once | ORAL | Status: AC
  Administered 2018-07-01: 40 meq via ORAL
  Filled 2018-07-01: qty 2

## 2018-07-01 MED ORDER — LEVOTHYROXINE SODIUM 75 MCG PO TABS
150.0000 ug | ORAL_TABLET | Freq: Every day | ORAL | Status: DC
  Administered 2018-07-01: 150 ug via ORAL
  Filled 2018-07-01: qty 2

## 2018-07-01 MED ORDER — METRONIDAZOLE 500 MG PO TABS
500.0000 mg | ORAL_TABLET | Freq: Three times a day (TID) | ORAL | Status: DC
  Administered 2018-07-01 – 2018-07-02 (×3): 500 mg via ORAL
  Filled 2018-07-01 (×3): qty 1

## 2018-07-01 MED ORDER — AMLODIPINE BESYLATE 10 MG PO TABS
10.0000 mg | ORAL_TABLET | Freq: Every day | ORAL | Status: DC
  Administered 2018-07-01 – 2018-07-02 (×2): 10 mg via ORAL
  Filled 2018-07-01: qty 1

## 2018-07-01 NOTE — Progress Notes (Signed)
PROGRESS NOTE  Morgan [name]  KKX:[id number] DOB: [birth year] DOA: 06/27/2018 PCP: Bradly Bienenstock., MD  Outpatient Specialists: Endocrinology, Dr. Denton Lank Brief Narrative: Morgan [name] is a 52 y.o. female with a history of HTN, HLD, IDDM on insulin pump, and hypothyroidism who presented with several days of weakness, progressive confusion in the setting of 2 weeks of waxing/waning nausea, vomiting, and diarrhea which had become severe and purely watery. She was sent to ED by her son who found her unresponsive. On evaluation she was altered but nonfocal, hyperglycemic, not in DKA, and septic with hypotension, lactic acid 5.08. Blood cultures were drawn, empiric 30cc/kg IVF's and broad spectrum antibiotics started. CDiff assay turned positive. Due to sepsis with AMS on arrival, IV flagyl was continued and po vancomycin added. Blood sugars have been uncontrolled but improving. Symptoms remain severe though po intake and weakness are improving.  Assessment & Plan: Principal Problem:   Sepsis ([place]) Active Problems:   Hypothyroidism   Essential hypertension   Hypokalemia   Diabetes mellitus type 2 in obese ([place])   AKI (acute kidney injury) ([place])   Acute metabolic encephalopathy  Sepsis with organ failure (AMS, lactic acid elevation, hypotension) due to C. diff colitis: Unclear source of community-acquired CDiff without typical predisposition (sick contacts/abx, etc.). GI panel further negative. Continues to have significant symptoms requiring IV fluids for GI loss replacement. - Monitor blood cultures (NG4D) - Continue flagyl, though can convert to po, and continue po vancomycin.  - GI losses continue though she may be able to keep up with losses by mouth. Will DC fluids and recheck. - Low grade leukocytosis remains.  Acute metabolic encephalopathy: Resolved. Due to sepsis.  - Monitor mental status and continue home psychotropic medications as recently titrated by outpatient  psychiatrist. Increase prozac as prescribed by outpatient provider (60mg )  AKI: Resolved, due to sepsis.  - Monitor BMP - Avoid NSAIDs  IDDM: Uncontrolled with hyperglycemia: HbA1c recently 9.8%, down from 2017 when it was 13.2%. Unable to use insulin pump while inpatient. Suspect acute elevation in setting of infection, but po intake is unreliable making exact insulin regimen uncertain.  - Continue basal bolus insulin, lantus 60u qAM, increased resistant and mealtime insulin with improved control, no hypoglycemia. Still basal-heavy, will plan to restart insulin pump at home and have close endocrinology follow up.  HTN: Elevated. - Continue losartan, started norvasc, will increase dose for now. Do not anticipate discharging with this.   Hypokalemia: K 2.4 on arrival due to GI losses, improved with supplementation but continues.  - Supplement by mouth today and recheck in AM with magnesium.  Hypothyroidism: TSH and free T4 wnl - Continue synthroid.   DVT prophylaxis: Lovenox Code Status: Full Family Communication: None at bedside Disposition Plan: Will trial off IVF's today and recheck in AM. She's tolerating po, so if frequency of stools improves may DC home  Consultants:   Diabetes coordinator  Procedures:   None  Antimicrobials:  Vancomycin IV 11/26  Aztreonam 11/26  Flagyl IV 11/26 - 11/30; PO 11/30 >>   Vancomycin po 11/27 >>    Subjective: Still copious liquid stools but has more ability to hold it before getting to the bathroom. >5 per day still. Eating without N/V or abd pain. No fever. She feels more energy currently than she has had in weeks. Slept well.   Objective: Vitals:   06/30/18 2133 06/30/18 2222 07/01/18 0613 07/01/18 1517  BP: (!) 181/91 (!) 170/80 (!) 139/91 (!) 164/88  Pulse:  Resp:      Temp:      TempSrc:      SpO2:      Weight:      Height:        Intake/Output Summary (Last 24 hours) at 07/01/2018 1800 Last data filed at  07/01/2018 0300 Gross per 24 hour  Intake 1393.81 ml  Output -  Net 1393.81 ml   Filed Weights   06/28/18 0447 06/29/18 0430 06/30/18 0616  Weight: 113.4 kg 114.6 kg 113.8 kg   Gen: Pleasant, obese female in no distress Pulm: Nonlabored breathing room air. Clear. CV: Regular rate and rhythm. No murmur, rub, or gallop. No JVD, no dependent edema. GI: Abdomen soft, non-tender, non-distended, with normoactive bowel sounds.  Ext: Warm, no deformities Skin: No rashes, lesions or ulcers on visualized skin. [name]  Neuro: Alert and oriented. No focal neurological deficits. Psych: Judgement and insight appear fair. Mood euthymic & affect congruent. Behavior is appropriate.    Data Reviewed: I have personally reviewed following labs and imaging studies  CBC: Recent Labs  Lab 06/27/18 0451 06/29/18 0348 06/30/18 0420 07/01/18 0318  WBC 19.2* 11.4* 12.2* 11.2*  NEUTROABS 8.7*  --   --   --   HGB 13.6 10.6* 10.7* 12.0  HCT 45.6 35.3* 36.1 39.0  MCV 66.8* 66.9* 66.1* 65.5*  PLT 370 242 260 315   Basic Metabolic Panel: Recent Labs  Lab 06/27/18 0451 06/27/18 1444 06/28/18 0435 06/29/18 0348 06/30/18 0420 07/01/18 0318  NA 135  --  136 136 135 137  K 2.4* 3.2* 4.2 4.2 3.2* 3.4*  CL 101  --  110 109 105 100  CO2 22  --  21* 20* 25 25  GLUCOSE 183*  --  322* 289* 157* 172*  BUN 19  --  10 <5* <5* <5*  CREATININE 1.31*  --  0.87 0.81 0.72 0.77  CALCIUM 9.8  --  7.8* 8.2* 8.4* 9.0  MG  --  1.6* 2.0  --   --   --    GFR: Estimated Creatinine Clearance: 102.9 mL/min (by C-G formula based on SCr of 0.77 mg/dL). Liver Function Tests: Recent Labs  Lab 06/27/18 0451 06/29/18 0348  AST 50* 39  ALT 56* 45*  ALKPHOS 73 58  BILITOT 0.8 0.5  PROT 6.3* 5.3*  ALBUMIN 3.6 2.9*   No results for input(s): LIPASE, AMYLASE in the last 168 hours. No results for input(s): AMMONIA in the last 168 hours. Coagulation Profile: No results for input(s): INR, PROTIME in the last 168  hours. Cardiac Enzymes: Recent Labs  Lab 06/27/18 0748  CKTOTAL 30*   BNP (last 3 results) No results for input(s): PROBNP in the last 8760 hours. HbA1C: No results for input(s): HGBA1C in the last 72 hours. CBG: Recent Labs  Lab 06/30/18 1646 06/30/18 2142 07/01/18 0736 07/01/18 1151 07/01/18 1632  GLUCAP 227* 297* 156* 186* 241*   Lipid Profile: No results for input(s): CHOL, HDL, LDLCALC, TRIG, CHOLHDL, LDLDIRECT in the last 72 hours. Thyroid Function Tests: No results for input(s): TSH, T4TOTAL, FREET4, T3FREE, THYROIDAB in the last 72 hours. Anemia Panel: No results for input(s): VITAMINB12, FOLATE, FERRITIN, TIBC, IRON, RETICCTPCT in the last 72 hours. Urine analysis:    Component Value Date/Time   COLORURINE YELLOW 06/27/2018 0455   APPEARANCEUR HAZY (A) 06/27/2018 0455   LABSPEC 1.036 (H) 06/27/2018 0455   PHURINE 6.0 06/27/2018 0455   GLUCOSEU >=500 (A) 06/27/2018 0455   HGBUR NEGATIVE 06/27/2018 0455  BILIRUBINUR NEGATIVE 06/27/2018 [place] 06/27/2018 0455   PROTEINUR NEGATIVE 06/27/2018 0455   UROBILINOGEN 0.2 08/03/2013 2352   NITRITE NEGATIVE 06/27/2018 0455   LEUKOCYTESUR NEGATIVE 06/27/2018 0455   Recent Results (from the past 240 hour(s))  Blood Culture (routine x 2)     Status: None (Preliminary result)   Collection Time: 06/27/18  4:45 AM  Result Value Ref Range Status   Specimen Description BLOOD LEFT ARM  Final   Special Requests   Final    BOTTLES DRAWN AEROBIC AND ANAEROBIC Blood Culture adequate volume   Culture   Final    NO GROWTH 4 DAYS Performed at [facility], [address], [place], [place] [address]    Report Status PENDING  Incomplete  Blood Culture (routine x 2)     Status: None (Preliminary result)   Collection Time: 06/27/18  4:50 AM  Result Value Ref Range Status   Specimen Description BLOOD RIGHT HAND  Final   Special Requests   Final    BOTTLES DRAWN AEROBIC AND ANAEROBIC Blood Culture adequate  volume   Culture   Final    NO GROWTH 4 DAYS Performed at [facility], [place] [address], [place], [place] [address]    Report Status PENDING  Incomplete  Urine culture     Status: None   Collection Time: 06/27/18  4:56 AM  Result Value Ref Range Status   Specimen Description URINE, CATHETERIZED  Final   Special Requests NONE  Final   Culture   Final    NO GROWTH Performed at [facility], [place] [address], [place], [place] [address]    Report Status 06/28/2018 FINAL  Final  MRSA PCR Screening     Status: None   Collection Time: 06/27/18  2:36 PM  Result Value Ref Range Status   MRSA by PCR NEGATIVE NEGATIVE Final    Comment:        The GeneXpert MRSA Assay (FDA approved for NASAL specimens only), is one component of a comprehensive MRSA colonization surveillance program. It is not intended to diagnose MRSA infection nor to guide or monitor treatment for MRSA infections. Performed at [facility], [place] [address], [place],  [address]   Gastrointestinal Panel by PCR , Stool     Status: None   Collection Time: 06/28/18  5:06 AM  Result Value Ref Range Status   Campylobacter species NOT DETECTED NOT DETECTED Final   Plesimonas shigelloides NOT DETECTED NOT DETECTED Final   Salmonella species NOT DETECTED NOT DETECTED Final   Yersinia enterocolitica NOT DETECTED NOT DETECTED Final   Vibrio species NOT DETECTED NOT DETECTED Final   Vibrio cholerae NOT DETECTED NOT DETECTED Final   Enteroaggregative E coli (EAEC) NOT DETECTED NOT DETECTED Final   Enteropathogenic E coli (EPEC) NOT DETECTED NOT DETECTED Final   Enterotoxigenic E coli (ETEC) NOT DETECTED NOT DETECTED Final   Shiga like toxin producing E coli (STEC) NOT DETECTED NOT DETECTED Final   Shigella/Enteroinvasive E coli (EIEC) NOT DETECTED NOT DETECTED Final   Cryptosporidium NOT DETECTED NOT DETECTED Final   Cyclospora cayetanensis NOT DETECTED NOT DETECTED Final   Entamoeba histolytica NOT  DETECTED NOT DETECTED Final   Giardia lamblia NOT DETECTED NOT DETECTED Final   Adenovirus F40/41 NOT DETECTED NOT DETECTED Final   Astrovirus NOT DETECTED NOT DETECTED Final   Norovirus GI/GII NOT DETECTED NOT DETECTED Final   Rotavirus A NOT DETECTED NOT DETECTED Final  Sapovirus (I, II, IV, and V) NOT DETECTED NOT DETECTED Final    Comment: Performed at [facility], [place] [place],  [address]  C difficile quick scan w PCR reflex     Status: Abnormal   Collection Time: 06/28/18  5:06 AM  Result Value Ref Range Status   C Diff antigen POSITIVE (A) NEGATIVE Final   C Diff toxin POSITIVE (A) NEGATIVE Final   C Diff interpretation Toxin producing C. difficile detected.  Final    Comment: CRITICAL RESULT CALLED TO, READ BACK BY AND VERIFIED WITHValora Corporal RN [phone] 06/28/18 A BROWNING Performed at [facility], [place] [address] [place],  [address]       Radiology Studies: No results found.  Scheduled Meds: . amLODipine  10 mg Oral Daily  . aspirin EC  81 mg Oral BH-q7a  . doxepin  50 mg Oral QHS  . enoxaparin (LOVENOX) injection  40 mg Subcutaneous Q24H  . FLUoxetine  60 mg Oral BH-q7a  . hydrALAZINE  25 mg Oral Q8H  . insulin aspart  0-20 Units Subcutaneous TID WC  . insulin aspart  0-5 Units Subcutaneous QHS  . insulin aspart  8 Units Subcutaneous TID WC  . insulin glargine  60 Units Subcutaneous Daily  . levothyroxine  150 mcg Oral QHS  . losartan  100 mg Oral Daily  . lurasidone  40 mg Oral Daily  . metroNIDAZOLE  500 mg Oral Q8H  . pantoprazole  40 mg Oral BH-q7a  . QUEtiapine  150 mg Oral QHS  . sodium chloride flush  3 mL Intravenous Q12H  . vancomycin  125 mg Oral QID   Continuous Infusions:    LOS: 4 days   Time spent: 25 minutes.  Patrecia Pour, MD [facility] [email or web address] Password [email or web address] 07/01/2018, 6:00 PM

## 2018-07-02 LAB — GLUCOSE, CAPILLARY
Glucose-Capillary: 253 mg/dL — ABNORMAL HIGH (ref 70–99)
Glucose-Capillary: 236 mg/dL — ABNORMAL HIGH (ref 70–99)

## 2018-07-02 LAB — BASIC METABOLIC PANEL
Anion gap: 12 (ref 5–15)
BUN: 6 mg/dL (ref 6–20)
CHLORIDE: 100 mmol/L (ref 98–111)
CO2: 24 mmol/L (ref 22–32)
Calcium: 8.7 mg/dL — ABNORMAL LOW (ref 8.9–10.3)
Creatinine, Ser: 0.76 mg/dL (ref 0.44–1.00)
GFR calc Af Amer: >60 mL/min (ref >60)
GFR calc non Af Amer: >60 mL/min (ref >60)
Glucose, Bld: 226 mg/dL — ABNORMAL HIGH (ref 70–99)
POTASSIUM: 4 mmol/L (ref 3.5–5.1)
Sodium: 136 mmol/L (ref 135–145)

## 2018-07-02 LAB — CULTURE, BLOOD (ROUTINE X 2)
CULTURE: NO GROWTH
Culture: NO GROWTH
Special Requests: ADEQUATE
Special Requests: ADEQUATE

## 2018-07-02 LAB — CBC
HEMATOCRIT: 37.4 % (ref 36.0–46.0)
HEMOGLOBIN: 11.3 g/dL — AB (ref 12.0–15.0)
MCH: 19.9 pg — ABNORMAL LOW (ref 26.0–34.0)
MCHC: 30.2 g/dL (ref 30.0–36.0)
MCV: 66 fL — ABNORMAL LOW (ref 80.0–100.0)
Platelets: 249 10*3/uL (ref 150–400)
RBC: 5.67 MIL/uL — ABNORMAL HIGH (ref 3.87–5.11)
RDW: 16.6 % — ABNORMAL HIGH (ref 11.5–15.5)
WBC: 12.5 10*3/uL — ABNORMAL HIGH (ref 4.0–10.5)
nRBC: 0 % (ref 0.0–0.2)

## 2018-07-02 LAB — MAGNESIUM: Magnesium: 1.5 mg/dL — ABNORMAL LOW (ref 1.7–2.4)

## 2018-07-02 MED ORDER — VANCOMYCIN HCL 125 MG PO CAPS: 125.0000 mg | ORAL_CAPSULE | Freq: Four times a day (QID) | ORAL | 0 refills | Status: AC

## 2018-07-02 MED ORDER — VANCOMYCIN HCL 125 MG PO CAPS: 125.0000 mg | ORAL_CAPSULE | Freq: Four times a day (QID) | ORAL | 0 refills | Status: DC

## 2018-07-02 MED ORDER — CONTOUR NEXT ONE KIT: 1.0000 | PACK | 0 refills | Status: DC

## 2018-07-02 NOTE — Discharge Summary (Signed)
Physician Discharge Summary  Morgan [name] HEN:[id number] DOB: 03-24-1966 DOA: 06/27/2018  PCP: Bradly Bienenstock., MD  Admit date: 06/27/2018 Discharge date: 07/02/2018  Admitted From: Home Disposition: Home   Recommendations for Outpatient Follow-up:  1. Follow up with PCP in 1-2 weeks. 2. Please obtain BMP/CBC in one week. 3. Follow up with endocrinology in the next week.  Home Health: None Equipment/Devices: None Discharge Condition: Stable CODE STATUS: Full Diet recommendation: Heart healthy, carb-modified  Brief/Interim Summary: Morgan [name] is a 52 y.o. female with a history of HTN, HLD, IDDM on insulin pump, and hypothyroidism who presented with several days of weakness, progressive confusion in the setting of 2 weeks of waxing/waning nausea, vomiting, and diarrhea which had become severe and purely watery. She was sent to ED by her son who found her unresponsive. On evaluation she was altered but nonfocal, hyperglycemic, not in DKA, and septic with hypotension, lactic acid 5.08. Blood cultures were drawn, empiric 30cc/kg IVF's and broad spectrum antibiotics started. CDiff assay turned positive. Due to sepsis with AMS on arrival, IV flagyl was continued and po vancomycin added. Blood sugars have been uncontrolled but improved with titration of insulin. Symptoms slowly resolved over the course of several days and the patient appears well on the day of discharge.  Sepsis with organ failure (AMS, lactic acid elevation, hypotension) due to C. diff colitis: Unclear source of community-acquired CDiff without typical predisposition (sick contacts/abx, etc.). GI panel further negative. Continues to have significant symptoms requiring IV fluids for GI loss replacement. - Monitor blood cultures (NG4D) - Continue po vancomycin, will stop flagyl with good clinical response.  - GI losses improved, tolerating po and not dependent on IVF's.  - Low grade leukocytosis remains. Suggest  recheck at follow up.   Acute metabolic encephalopathy: Resolved. Due to sepsis.  - Follow up with psychiatry.  AKI: Resolved, due to sepsis.  - Monitor BMP at follow up. - Avoid NSAIDs  IDDM: Uncontrolled with hyperglycemia: HbA1c recently 9.8%, down from 2017 when it was 13.2%. Unable to use insulin pump while inpatient. Suspect acute elevation in setting of infection, but po intake is unreliable making exact insulin regimen uncertain.  - Continue insulin pump per pt. Given prescription for glucose monitoring. Will need close endocrinology follow up. Note while on hospital diet, basal bolus insulin, lantus 60u qAM, resistant sliding scale and mealtime insulin demonstrated good control.   HTN: Elevated. - Continue losartan, started norvasc, will increase dose for now. Do not anticipate discharging with this.   Hypokalemia: K 2.4 on arrival due to GI losses, improved with supplementation but continues. Improved with improved po intake.  Hypothyroidism: TSH and free T4 wnl - Continue synthroid.   Discharge Diagnoses:  Principal Problem:   Sepsis ([place]) Active Problems:   Hypothyroidism   Essential hypertension   Hypokalemia   Diabetes mellitus type 2 in obese ([place])   AKI (acute kidney injury) ([place])   Acute metabolic encephalopathy  Discharge Instructions Discharge Instructions    Diet - low sodium heart healthy   Complete by:  As directed    Diet Carb Modified   Complete by:  As directed    Discharge instructions   Complete by:  As directed    Continue taking vancomycin capsule four times daily for the next 5 days to complete treatment for C. difficile colitis.   Restart the insulin pump and other medications as you were and follow up closely with our PCP and endocrinologist.   Symptoms should  slowly continue to improve. If your symptoms WORSEN seek medical attention right away.   Increase activity slowly   Complete by:  As directed      Allergies as of 07/02/2018       Reactions   Ilosone [erythromycin] Anaphylaxis   Keflex [cephalexin] Anaphylaxis   Penicillins Anaphylaxis   Has patient had a PCN reaction causing immediate rash, facial/tongue/throat swelling, SOB or lightheadedness with hypotension: YES Has patient had a PCN reaction causing severe rash involving mucus membranes or skin necrosis: NO Has patient had a PCN reaction that required hospitalizationNO Has patient had a PCN reaction occurring within the last 10 years: NO If all of the above answers are "NO", then may proceed with Cephalosporin use.   Iron Other (See Comments)   Pt reports condition limiting Iron intake.   Metformin And Related Other (See Comments)   Pt prefers to not take this medication   Morphine And Related Other (See Comments)   Pt reports hallucinations      Medication List    STOP taking these medications   potassium chloride 10 MEQ tablet Commonly known as:  K-DUR     TAKE these medications   aspirin 81 MG tablet Take 81 mg by mouth every morning.   BC HEADACHE POWDER PO Take 1 Package by mouth every 6 (six) hours as needed (dental pain).   Co-Enzyme Q-10 30 MG Caps Take 30 mg by mouth daily.   CONTOUR NEXT ONE Kit 1 kit by Does not apply route as directed.   doxepin 25 MG capsule Commonly known as:  SINEQUAN Take 50 mg by mouth at bedtime.   EPINEPHrine 0.3 mg/0.3 mL [place] injection Commonly known as:  EPI-PEN Inject 0.3 mg into the muscle once as needed.   FLUoxetine 20 MG tablet Commonly known as:  PROZAC Take 60 mg by mouth every morning.   GLUCAGON EMERGENCY 1 MG injection Generic drug:  glucagon Inject 1 mg into the vein once as needed. USE AT ONSET OF HYPOGLYCEMIA   insulin lispro 100 UNIT/ML injection Commonly known as:  HUMALOG Inject 200 Units into the skin daily. Using in pump   LATUDA 40 MG Tabs tablet Generic drug:  lurasidone Take 40 mg by mouth daily.   levothyroxine 150 MCG tablet Commonly known as:  SYNTHROID,  LEVOTHROID Take 150 mcg by mouth daily.   losartan 100 MG tablet Commonly known as:  COZAAR Take 100 mg by mouth daily.   multivitamin with minerals Tabs tablet Take 1 tablet by mouth daily.   naproxen sodium 220 MG tablet Commonly known as:  ALEVE Take 440 mg by mouth 2 (two) times daily as needed (PAIN).   OZEMPIC (0.25 OR 0.5 MG/DOSE) [place] Inject 0.5 mg into the skin every Monday.   pantoprazole 20 MG tablet Commonly known as:  PROTONIX Take 40 mg by mouth every morning.   QUEtiapine 50 MG tablet Commonly known as:  SEROQUEL Take 150 mg by mouth at bedtime.   vancomycin 125 MG capsule Commonly known as:  VANCOCIN Take 1 capsule (125 mg total) by mouth 4 (four) times daily for 5 days.   Vitamin D 50 MCG (2000 UT) Caps Take 2,000-4,000 Units by mouth See admin instructions. 4,000units daily on Mon/Wed/Fri and 2,000units daily on all other days      Follow-up Information    Bradly Bienenstock., MD. Schedule an appointment as soon as possible for a visit in 1 week(s).   Contact information: [address]  [place] [address] [phone]          Allergies  Allergen Reactions  . Ilosone [Erythromycin] Anaphylaxis  . Keflex [Cephalexin] Anaphylaxis  . Penicillins Anaphylaxis    Has patient had a PCN reaction causing immediate rash, facial/tongue/throat swelling, SOB or lightheadedness with hypotension: YES Has patient had a PCN reaction causing severe rash involving mucus membranes or skin necrosis: NO Has patient had a PCN reaction that required hospitalizationNO Has patient had a PCN reaction occurring within the last 10 years: NO If all of the above answers are "NO", then may proceed with Cephalosporin use.  . Iron Other (See Comments)    Pt reports condition limiting Iron intake.  . Metformin And Related Other (See Comments)    Pt prefers to not take this medication  . Morphine And Related Other (See Comments)    Pt reports hallucinations     Consultations:  None  Procedures/Studies: Dg Chest Port 1 View  Result Date: 06/27/2018 CLINICAL DATA:  Fever EXAM: PORTABLE CHEST 1 VIEW COMPARISON:  10/10/2017 FINDINGS: The heart size and mediastinal contours are within normal limits. Both lungs are clear. The visualized skeletal structures are unremarkable. IMPRESSION: No active disease. Electronically Signed   By: Ulyses Jarred M.D.   On: 06/27/2018 05:17    Subjective: Eating, drinking normally. No abd pain, N/V, diarrhea significantly declined. Slept great the past 2 nights, no further weakness. No fever. Wants to go home.  Discharge Exam: Vitals:   07/02/18 0852 07/02/18 1420  BP: (!) 155/97 (!) 144/66  Pulse:  84  Resp:    Temp:  98.2 F (36.8 C)  SpO2:     General: Pt is alert, awake, not in acute distress Cardiovascular: RRR, S1/S2 +, no rubs, no gallops Respiratory: CTA bilaterally, no wheezing, no rhonchi Abdominal: Soft, NT, ND, bowel sounds + Extremities: No edema, no cyanosis  Labs: BNP (last 3 results) No results for input(s): BNP in the last 8760 hours. Basic Metabolic Panel: Recent Labs  Lab 06/28/18 0435 06/29/18 0348 06/30/18 0420 07/01/18 0318 07/02/18 0509  NA 136 136 135 137 136  K 4.2 4.2 3.2* 3.4* 4.0  CL 110 109 105 100 100  CO2 21* 20* '25 25 24  ' GLUCOSE 322* 289* 157* 172* 226*  BUN 10 <5* <5* <5* 6  CREATININE 0.87 0.81 0.72 0.77 0.76  CALCIUM 7.8* 8.2* 8.4* 9.0 8.7*  MG 2.0  --   --   --  1.5*   Liver Function Tests: Recent Labs  Lab 06/29/18 0348  AST 39  ALT 45*  ALKPHOS 58  BILITOT 0.5  PROT 5.3*  ALBUMIN 2.9*   No results for input(s): LIPASE, AMYLASE in the last 168 hours. No results for input(s): AMMONIA in the last 168 hours. CBC: Recent Labs  Lab 06/29/18 0348 06/30/18 0420 07/01/18 0318 07/02/18 0509  WBC 11.4* 12.2* 11.2* 12.5*  HGB 10.6* 10.7* 12.0 11.3*  HCT 35.3* 36.1 39.0 37.4  MCV 66.9* 66.1* 65.5* 66.0*  PLT 242 260 288 249   Cardiac  Enzymes: No results for input(s): CKTOTAL, CKMB, CKMBINDEX, TROPONINI in the last 168 hours. BNP: Invalid input(s): POCBNP CBG: Recent Labs  Lab 07/01/18 1151 07/01/18 1632 07/01/18 2140 07/02/18 0741 07/02/18 1129  GLUCAP 186* 241* 162* 253* 236*   D-Dimer No results for input(s): DDIMER in the last 72 hours. Hgb A1c No results for input(s): HGBA1C in the last 72 hours. Lipid Profile No results for input(s): CHOL, HDL, LDLCALC, TRIG, CHOLHDL, LDLDIRECT  in the last 72 hours. Thyroid function studies No results for input(s): TSH, T4TOTAL, T3FREE, THYROIDAB in the last 72 hours.  Invalid input(s): FREET3 Anemia work up No results for input(s): VITAMINB12, FOLATE, FERRITIN, TIBC, IRON, RETICCTPCT in the last 72 hours. Urinalysis    Component Value Date/Time   COLORURINE YELLOW 06/27/2018 0455   APPEARANCEUR HAZY (A) 06/27/2018 0455   LABSPEC 1.036 (H) 06/27/2018 0455   PHURINE 6.0 06/27/2018 0455   GLUCOSEU >=500 (A) 06/27/2018 0455   HGBUR NEGATIVE 06/27/2018 0455   BILIRUBINUR NEGATIVE 06/27/2018 0455   KETONESUR NEGATIVE 06/27/2018 0455   PROTEINUR NEGATIVE 06/27/2018 0455   UROBILINOGEN 0.2 08/03/2013 2352   NITRITE NEGATIVE 06/27/2018 0455   LEUKOCYTESUR NEGATIVE 06/27/2018 0455    Microbiology Recent Results (from the past 240 hour(s))  Blood Culture (routine x 2)     Status: None   Collection Time: 06/27/18  4:45 AM  Result Value Ref Range Status   Specimen Description BLOOD LEFT ARM  Final   Special Requests   Final    BOTTLES DRAWN AEROBIC AND ANAEROBIC Blood Culture adequate volume   Culture   Final    NO GROWTH 5 DAYS Performed at [facility], [address], [place], [place] [address]    Report Status 07/02/2018 FINAL  Final  Blood Culture (routine x 2)     Status: None   Collection Time: 06/27/18  4:50 AM  Result Value Ref Range Status   Specimen Description BLOOD RIGHT HAND  Final   Special Requests   Final    BOTTLES DRAWN AEROBIC AND  ANAEROBIC Blood Culture adequate volume   Culture   Final    NO GROWTH 5 DAYS Performed at [facility], [place] [address], [place], [place] [address]    Report Status 07/02/2018 FINAL  Final  Urine culture     Status: None   Collection Time: 06/27/18  4:56 AM  Result Value Ref Range Status   Specimen Description URINE, CATHETERIZED  Final   Special Requests NONE  Final   Culture   Final    NO GROWTH Performed at [facility], [place] [address] [place], [place] [address]    Report Status 06/28/2018 FINAL  Final  MRSA PCR Screening     Status: None   Collection Time: 06/27/18  2:36 PM  Result Value Ref Range Status   MRSA by PCR NEGATIVE NEGATIVE Final    Comment:        The GeneXpert MRSA Assay (FDA approved for NASAL specimens only), is one component of a comprehensive MRSA colonization surveillance program. It is not intended to diagnose MRSA infection nor to guide or monitor treatment for MRSA infections. Performed at [facility], [place] [address] [place], [place] [address]   Gastrointestinal Panel by PCR , Stool     Status: None   Collection Time: 06/28/18  5:06 AM  Result Value Ref Range Status   Campylobacter species NOT DETECTED NOT DETECTED Final   Plesimonas shigelloides NOT DETECTED NOT DETECTED Final   Salmonella species NOT DETECTED NOT DETECTED Final   Yersinia enterocolitica NOT DETECTED NOT DETECTED Final   Vibrio species NOT DETECTED NOT DETECTED Final   Vibrio cholerae NOT DETECTED NOT DETECTED Final   Enteroaggregative E coli (EAEC) NOT DETECTED NOT DETECTED Final   Enteropathogenic E coli (EPEC) NOT DETECTED NOT DETECTED Final   Enterotoxigenic E coli (ETEC) NOT DETECTED NOT DETECTED Final   Shiga like toxin producing  E coli (STEC) NOT DETECTED NOT DETECTED Final   Shigella/Enteroinvasive E coli (EIEC) NOT DETECTED NOT DETECTED Final   Cryptosporidium NOT DETECTED NOT DETECTED Final   Cyclospora cayetanensis NOT DETECTED NOT DETECTED  Final   Entamoeba histolytica NOT DETECTED NOT DETECTED Final   Giardia lamblia NOT DETECTED NOT DETECTED Final   Adenovirus F40/41 NOT DETECTED NOT DETECTED Final   Astrovirus NOT DETECTED NOT DETECTED Final   Norovirus GI/GII NOT DETECTED NOT DETECTED Final   Rotavirus A NOT DETECTED NOT DETECTED Final   Sapovirus (I, II, IV, and V) NOT DETECTED NOT DETECTED Final    Comment: Performed at [facility], [address] [place], [place] [address]  C difficile quick scan w PCR reflex     Status: Abnormal   Collection Time: 06/28/18  5:06 AM  Result Value Ref Range Status   C Diff antigen POSITIVE (A) NEGATIVE Final   C Diff toxin POSITIVE (A) NEGATIVE Final   C Diff interpretation Toxin producing C. difficile detected.  Final    Comment: CRITICAL RESULT CALLED TO, READ BACK BY AND VERIFIED WITHValora Corporal RN [phone] 06/28/18 A BROWNING Performed at [facility], [place] [address], [place], [place] [address]     Time coordinating discharge: Approximately 40 minutes  Patrecia Pour, MD  [facility] 07/04/2018, 7:29 PM Pager [phone]

## 2018-07-02 NOTE — Progress Notes (Signed)
CSW was consulted by CM Caryl Pina for transportation needs for medication pickup. CSW gave bus passes (2) to pick up medication on Monday. No further needs noted at this time. CSW signing off.  Reed Breech LCSWA [phone]

## 2018-07-02 NOTE — Care Management (Signed)
Consulted by Dr. Bonner Puna for transportation needs to pharmacy.  Pt's brother will pick her up later this afternoon but will not take her to pharmacy.  Pt's car is broken down and she has no cash for cab/bus.  Pt agrees that she can use bus passes to get from home to pharmacy and home again.    Scripts sent to [facility] on [address] since that is only pharmacy that has medication in stock.  RN will give patient as many doses as possible prior to discharge.

## 2018-07-02 NOTE — Discharge Instructions (Signed)
Sepsis, Adult Sepsis is a serious bodily reaction to an infection. The infection that causes sepsis may be from a bacteria, a virus, a fungus, or a parasite. Sepsis can result from an infection in any part of the body. Infections that commonly lead to sepsis include skin, lung, and urinary tract infections. Sepsis is a medical emergency that requires immediate treatment at the hospital. In severe cases, it can lead to septic shock. Shock can weaken the heart and cause blood pressure to drop. This can make the central nervous system and the body's organs to stop working. What are the causes? This condition is caused by a severe reaction to a bacterial, viral, fungal, or parasitic infection. The germs that most commonly lead to sepsis include:  Escherichia coli (E. coli).  Staphylococcus aureus (staph).  The most common infections that lead to sepsis include infections of:  The skin.  The lung (pneumonia).  The gut.  The kidneys (urinary tract infection).  What increases the risk? You are more likely to develop this condition if:  You have a weakened disease-fighting (immune) system.  You are 65 or older.  You are female.  You had surgery, or you have been hospitalized.  You have a catheter, breathing tube, or drainage tubes inserted into your body.  You are not getting enough nutrients from food (are malnourished).  You have other long-term (chronic) diseases, including: ? Cancer. ? AIDS. ? Liver disease. ? Lung disease. ? Diabetes.  You have severe burns or injuries.  You inject drugs.  You have heart valve problems.  What are the signs or symptoms? Symptoms of this condition may include:  Fever.  Chills or feeling very cold.  Fast heart rate (tachycardia).  Rapid breathing (hyperventilation).  Shortness of breath.  Confusion or light-headedness.  Changes in skin color. Your skin may look blotchy, pale, or blue.  Cool, clammy skin or sweaty skin.  Skin  rash.  Nausea and vomiting.  Urinating much less than usual.  How is this diagnosed? This condition is diagnosed based on:  Your symptoms.  Your medical history.  A physical exam.  Other tests may also be done to find out the cause of the infection and how severe the sepsis is. These tests may include:  Blood tests.  Urine tests.  Swabs from other areas of the body that may have an infection. These samples may be tested (cultured) to find out what type of bacteria is causing the infection.  Chest X-ray to check for pneumonia. Other imaging tests, such as a CT scan, may also be done.  Lumbar puncture. This is a procedure to remove a small amount of the fluid that surrounds the brain and spinal cord. The fluid is then examined for infection.  How is this treated? This condition is treated in a hospital with antibiotic medicines. You may also receive:  Fluids through an IV tube.  Oxygen and breathing assistance.  Kidney dialysis. This process cleans the blood if the kidneys have failed.  Surgery to remove infected tissue.  Medicines to increase your blood pressure.  Nutrients to correct imbalances in basic body function (metabolism). This may involve receiving important salts and minerals (electrolytes) through an IV and having your blood sugar level adjusted.  Steroid medicines to control your body's reaction to the infection.  Follow these instructions at home: Medicines  Take over-the-counter and prescription medicines only as told by your health care provider.  If you were prescribed an antibiotic or anti-fungal medicine, take it   as told by your health care provider. Do not stop taking the antibiotic or anti-fungal medicine even if you start to feel better. Activity  Rest and gradually return to your normal activities. Ask your health care provider what activities are safe for you.  Try to set small, achievable goals each week, such as dressing yourself,  bathing, or walking up stairs. It may take a while to rebuild your strength.  Try to exercise regularly, if you feel healthy enough to do so. Ask your health care provider what exercises are safe for you. General instructions  Drink enough fluid to keep your urine clear or pale yellow.  Eat a healthy, balanced diet. This includes plenty of fruits and vegetables, whole grains, and lowfat (lean) proteins. Ask your health care provider if you should avoid certain foods.  Keep all follow-up visits as told by your health care provider. This is important. Contact a health care provider if:  You do not feel like you are getting better or regaining strength.  You are having trouble coping with your recovery.  You frequently feel tired.  You feel worse or do not seem to get better after surgery.  You think you may have an infection after surgery. Get help right away if:  You have any symptoms of sepsis.  You have difficulty breathing.  You have a rapid or skipping heartbeat.  You become confused.  You have a high fever.  Your skin becomes blotchy, pale, or blue. These symptoms may represent a serious problem that is an emergency. Do not wait to see if the symptoms will go away. Get medical help right away. Call your local emergency services (911 in the [place] Summary  Sepsis is a medical emergency that requires immediate treatment at the hospital.  This condition is caused by a severe reaction to a bacterial, viral, fungal, or parasitic infection.  This condition is treated in a hospital with antibiotics. Treatment may also include IV fluids, breathing assistance, and kidney dialysis.  If you were prescribed an antibiotic or anti-fungal medicine, take it as told by your health care provider. Do not stop taking the antibiotic or anti-fungal medicine even if you start to feel better. This information is not intended to replace advice given to you by your health care provider. Make  sure you discuss any questions you have with your health care provider. Document Released: 04/17/2003 Document Revised: 06/22/2016 Document Reviewed: 06/22/2016 Elsevier Interactive Patient Education  2018 [employer]  

## 2018-07-21 ENCOUNTER — Inpatient Hospital Stay (HOSPITAL_COMMUNITY)
Admission: EM | Admit: 2018-07-21 | Discharge: 2018-07-26 | DRG: 392 | Disposition: A | Discharge status: Home Health | Payer: 59 | Attending: Internal Medicine | Admitting: Internal Medicine

## 2018-07-21 ENCOUNTER — Encounter (HOSPITAL_COMMUNITY): Payer: Self-pay | Admitting: *Deleted

## 2018-07-21 ENCOUNTER — Emergency Department (HOSPITAL_COMMUNITY): Payer: 59

## 2018-07-21 ENCOUNTER — Other Ambulatory Visit: Payer: Self-pay

## 2018-07-21 DIAGNOSIS — K210 Gastro-esophageal reflux disease with esophagitis: Secondary | ICD-10-CM | POA: Diagnosis not present

## 2018-07-21 DIAGNOSIS — E876 Hypokalemia: Secondary | ICD-10-CM | POA: Diagnosis present

## 2018-07-21 DIAGNOSIS — F319 Bipolar disorder, unspecified: Secondary | ICD-10-CM | POA: Diagnosis not present

## 2018-07-21 DIAGNOSIS — R0602 Shortness of breath: Secondary | ICD-10-CM

## 2018-07-21 DIAGNOSIS — R1310 Dysphagia, unspecified: Secondary | ICD-10-CM | POA: Diagnosis present

## 2018-07-21 DIAGNOSIS — R7989 Other specified abnormal findings of blood chemistry: Secondary | ICD-10-CM | POA: Diagnosis present

## 2018-07-21 DIAGNOSIS — E860 Dehydration: Secondary | ICD-10-CM | POA: Diagnosis present

## 2018-07-21 DIAGNOSIS — Z91048 Other nonmedicinal substance allergy status: Secondary | ICD-10-CM

## 2018-07-21 DIAGNOSIS — Z8711 Personal history of peptic ulcer disease: Secondary | ICD-10-CM

## 2018-07-21 DIAGNOSIS — R0789 Other chest pain: Secondary | ICD-10-CM | POA: Diagnosis not present

## 2018-07-21 DIAGNOSIS — J42 Unspecified chronic bronchitis: Secondary | ICD-10-CM | POA: Diagnosis present

## 2018-07-21 DIAGNOSIS — E785 Hyperlipidemia, unspecified: Secondary | ICD-10-CM | POA: Diagnosis present

## 2018-07-21 DIAGNOSIS — E1169 Type 2 diabetes mellitus with other specified complication: Secondary | ICD-10-CM | POA: Diagnosis present

## 2018-07-21 DIAGNOSIS — R12 Heartburn: Secondary | ICD-10-CM | POA: Diagnosis not present

## 2018-07-21 DIAGNOSIS — Z9641 Presence of insulin pump (external) (internal): Secondary | ICD-10-CM | POA: Diagnosis present

## 2018-07-21 DIAGNOSIS — R945 Abnormal results of liver function studies: Secondary | ICD-10-CM | POA: Diagnosis present

## 2018-07-21 DIAGNOSIS — Z885 Allergy status to narcotic agent status: Secondary | ICD-10-CM

## 2018-07-21 DIAGNOSIS — R7401 Elevation of levels of liver transaminase levels: Secondary | ICD-10-CM | POA: Diagnosis present

## 2018-07-21 DIAGNOSIS — T80818A Extravasation of other vesicant agent, initial encounter: Secondary | ICD-10-CM

## 2018-07-21 DIAGNOSIS — R5383 Other fatigue: Secondary | ICD-10-CM

## 2018-07-21 DIAGNOSIS — Z90710 Acquired absence of both cervix and uterus: Secondary | ICD-10-CM

## 2018-07-21 DIAGNOSIS — R000 Tachycardia, unspecified: Secondary | ICD-10-CM | POA: Diagnosis not present

## 2018-07-21 DIAGNOSIS — Z8673 Personal history of transient ischemic attack (TIA), and cerebral infarction without residual deficits: Secondary | ICD-10-CM

## 2018-07-21 DIAGNOSIS — E559 Vitamin D deficiency, unspecified: Secondary | ICD-10-CM | POA: Diagnosis present

## 2018-07-21 DIAGNOSIS — X58XXXA Exposure to other specified factors, initial encounter: Secondary | ICD-10-CM | POA: Diagnosis not present

## 2018-07-21 DIAGNOSIS — K319 Disease of stomach and duodenum, unspecified: Secondary | ICD-10-CM | POA: Diagnosis present

## 2018-07-21 DIAGNOSIS — K449 Diaphragmatic hernia without obstruction or gangrene: Secondary | ICD-10-CM | POA: Diagnosis present

## 2018-07-21 DIAGNOSIS — I951 Orthostatic hypotension: Secondary | ICD-10-CM | POA: Diagnosis not present

## 2018-07-21 DIAGNOSIS — Z8249 Family history of ischemic heart disease and other diseases of the circulatory system: Secondary | ICD-10-CM

## 2018-07-21 DIAGNOSIS — Z833 Family history of diabetes mellitus: Secondary | ICD-10-CM

## 2018-07-21 DIAGNOSIS — Z888 Allergy status to other drugs, medicaments and biological substances status: Secondary | ICD-10-CM

## 2018-07-21 DIAGNOSIS — R112 Nausea with vomiting, unspecified: Secondary | ICD-10-CM

## 2018-07-21 DIAGNOSIS — Z87891 Personal history of nicotine dependence: Secondary | ICD-10-CM

## 2018-07-21 DIAGNOSIS — R911 Solitary pulmonary nodule: Secondary | ICD-10-CM | POA: Diagnosis present

## 2018-07-21 DIAGNOSIS — K209 Esophagitis, unspecified without bleeding: Secondary | ICD-10-CM

## 2018-07-21 DIAGNOSIS — G43909 Migraine, unspecified, not intractable, without status migrainosus: Secondary | ICD-10-CM | POA: Diagnosis present

## 2018-07-21 DIAGNOSIS — N289 Disorder of kidney and ureter, unspecified: Secondary | ICD-10-CM

## 2018-07-21 DIAGNOSIS — E669 Obesity, unspecified: Secondary | ICD-10-CM

## 2018-07-21 DIAGNOSIS — D72829 Elevated white blood cell count, unspecified: Secondary | ICD-10-CM | POA: Diagnosis present

## 2018-07-21 DIAGNOSIS — J45909 Unspecified asthma, uncomplicated: Secondary | ICD-10-CM | POA: Diagnosis present

## 2018-07-21 DIAGNOSIS — K224 Dyskinesia of esophagus: Secondary | ICD-10-CM | POA: Diagnosis present

## 2018-07-21 DIAGNOSIS — K760 Fatty (change of) liver, not elsewhere classified: Secondary | ICD-10-CM | POA: Diagnosis present

## 2018-07-21 DIAGNOSIS — I10 Essential (primary) hypertension: Secondary | ICD-10-CM | POA: Diagnosis not present

## 2018-07-21 DIAGNOSIS — Z79899 Other long term (current) drug therapy: Secondary | ICD-10-CM

## 2018-07-21 DIAGNOSIS — Z79890 Hormone replacement therapy (postmenopausal): Secondary | ICD-10-CM

## 2018-07-21 DIAGNOSIS — D563 Thalassemia minor: Secondary | ICD-10-CM | POA: Diagnosis present

## 2018-07-21 DIAGNOSIS — Z7982 Long term (current) use of aspirin: Secondary | ICD-10-CM

## 2018-07-21 DIAGNOSIS — E039 Hypothyroidism, unspecified: Secondary | ICD-10-CM | POA: Diagnosis present

## 2018-07-21 DIAGNOSIS — N179 Acute kidney failure, unspecified: Secondary | ICD-10-CM | POA: Diagnosis present

## 2018-07-21 DIAGNOSIS — Z880 Allergy status to penicillin: Secondary | ICD-10-CM

## 2018-07-21 DIAGNOSIS — R079 Chest pain, unspecified: Secondary | ICD-10-CM

## 2018-07-21 DIAGNOSIS — I493 Ventricular premature depolarization: Secondary | ICD-10-CM | POA: Diagnosis present

## 2018-07-21 DIAGNOSIS — R9431 Abnormal electrocardiogram [ECG] [EKG]: Secondary | ICD-10-CM | POA: Diagnosis present

## 2018-07-21 DIAGNOSIS — F3130 Bipolar disorder, current episode depressed, mild or moderate severity, unspecified: Secondary | ICD-10-CM | POA: Diagnosis present

## 2018-07-21 DIAGNOSIS — Z794 Long term (current) use of insulin: Secondary | ICD-10-CM

## 2018-07-21 DIAGNOSIS — Z881 Allergy status to other antibiotic agents status: Secondary | ICD-10-CM

## 2018-07-21 DIAGNOSIS — T82898A Other specified complication of vascular prosthetic devices, implants and grafts, initial encounter: Secondary | ICD-10-CM

## 2018-07-21 DIAGNOSIS — Z8349 Family history of other endocrine, nutritional and metabolic diseases: Secondary | ICD-10-CM

## 2018-07-21 DIAGNOSIS — Z9049 Acquired absence of other specified parts of digestive tract: Secondary | ICD-10-CM

## 2018-07-21 DIAGNOSIS — Z8619 Personal history of other infectious and parasitic diseases: Secondary | ICD-10-CM

## 2018-07-21 DIAGNOSIS — Z6841 Body Mass Index (BMI) 40.0 and over, adult: Secondary | ICD-10-CM

## 2018-07-21 DIAGNOSIS — R531 Weakness: Secondary | ICD-10-CM

## 2018-07-21 DIAGNOSIS — F419 Anxiety disorder, unspecified: Secondary | ICD-10-CM | POA: Diagnosis present

## 2018-07-21 DIAGNOSIS — E1165 Type 2 diabetes mellitus with hyperglycemia: Secondary | ICD-10-CM | POA: Diagnosis present

## 2018-07-21 DIAGNOSIS — R740 Nonspecific elevation of levels of transaminase and lactic acid dehydrogenase [LDH]: Secondary | ICD-10-CM

## 2018-07-21 HISTORY — DX: Extravasation of other vesicant agent, initial encounter: T80.818A

## 2018-07-21 HISTORY — DX: Elevation of levels of liver transaminase levels: R74.01

## 2018-07-21 HISTORY — DX: Fatty (change of) liver, not elsewhere classified: K76.0

## 2018-07-21 LAB — URINALYSIS, ROUTINE W REFLEX MICROSCOPIC
Bilirubin Urine: NEGATIVE
Glucose, UA: 150 mg/dL — AB
Hgb urine dipstick: NEGATIVE
Ketones, ur: 5 mg/dL — AB
Leukocytes, UA: NEGATIVE
Nitrite: NEGATIVE
PH: 6 (ref 5.0–8.0)
Protein, ur: NEGATIVE mg/dL
Specific Gravity, Urine: 1.039 — ABNORMAL HIGH (ref 1.005–1.030)

## 2018-07-21 LAB — GLUCOSE, CAPILLARY: Glucose-Capillary: 207 mg/dL — ABNORMAL HIGH (ref 70–99)

## 2018-07-21 LAB — COMPREHENSIVE METABOLIC PANEL
ALBUMIN: 3.6 g/dL (ref 3.5–5.0)
ALK PHOS: 76 U/L (ref 38–126)
ALT: 95 U/L — ABNORMAL HIGH (ref 0–44)
AST: 95 U/L — ABNORMAL HIGH (ref 15–41)
Anion gap: 16 — ABNORMAL HIGH (ref 5–15)
BUN: 29 mg/dL — ABNORMAL HIGH (ref 6–20)
CO2: 24 mmol/L (ref 22–32)
Calcium: 8.9 mg/dL (ref 8.9–10.3)
Chloride: 91 mmol/L — ABNORMAL LOW (ref 98–111)
Creatinine, Ser: 1.18 mg/dL — ABNORMAL HIGH (ref 0.44–1.00)
GFR calc Af Amer: >60 mL/min (ref >60)
GFR calc non Af Amer: 52 mL/min — ABNORMAL LOW (ref >60)
Glucose, Bld: 405 mg/dL — ABNORMAL HIGH (ref 70–99)
Potassium: 3.7 mmol/L (ref 3.5–5.1)
Sodium: 131 mmol/L — ABNORMAL LOW (ref 135–145)
Total Bilirubin: 1.6 mg/dL — ABNORMAL HIGH (ref 0.3–1.2)
Total Protein: 6.8 g/dL (ref 6.5–8.1)

## 2018-07-21 LAB — D-DIMER, QUANTITATIVE: D-Dimer, Quant: 1.1 ug/mL-FEU — ABNORMAL HIGH (ref 0.00–0.50)

## 2018-07-21 LAB — CBC
HEMATOCRIT: 45.3 % (ref 36.0–46.0)
Hemoglobin: 13.9 g/dL (ref 12.0–15.0)
MCH: 20.1 pg — ABNORMAL LOW (ref 26.0–34.0)
MCHC: 30.7 g/dL (ref 30.0–36.0)
MCV: 65.5 fL — AB (ref 80.0–100.0)
Platelets: 423 10*3/uL — ABNORMAL HIGH (ref 150–400)
RBC: 6.92 MIL/uL — ABNORMAL HIGH (ref 3.87–5.11)
RDW: 17.8 % — ABNORMAL HIGH (ref 11.5–15.5)
WBC: 19.7 10*3/uL — ABNORMAL HIGH (ref 4.0–10.5)
nRBC: 0 % (ref 0.0–0.2)

## 2018-07-21 LAB — CBG MONITORING, ED: Glucose-Capillary: 383 mg/dL — ABNORMAL HIGH (ref 70–99)

## 2018-07-21 LAB — I-STAT TROPONIN, ED: Troponin i, poc: 0.01 ng/mL (ref 0.00–0.08)

## 2018-07-21 LAB — TROPONIN I: Troponin I: <0.03 ng/mL (ref <0.03)

## 2018-07-21 MED ORDER — SODIUM CHLORIDE 0.9 % IV SOLN
INTRAVENOUS | Status: DC
  Administered 2018-07-21: 23:00:00 via INTRAVENOUS

## 2018-07-21 MED ORDER — LEVOTHYROXINE SODIUM 75 MCG PO TABS
150.0000 ug | ORAL_TABLET | Freq: Every day | ORAL | Status: DC
  Administered 2018-07-22 – 2018-07-26 (×5): 150 ug via ORAL
  Filled 2018-07-21 (×3): qty 2
  Filled 2018-07-21: qty 1
  Filled 2018-07-21: qty 2
  Filled 2018-07-21 (×2): qty 1
  Filled 2018-07-21 (×2): qty 2
  Filled 2018-07-21: qty 1

## 2018-07-21 MED ORDER — LURASIDONE HCL 40 MG PO TABS
60.0000 mg | ORAL_TABLET | Freq: Every day | ORAL | Status: DC
  Administered 2018-07-22: 60 mg via ORAL
  Administered 2018-07-23: 40 mg via ORAL
  Administered 2018-07-24 – 2018-07-26 (×3): 60 mg via ORAL
  Filled 2018-07-21 (×5): qty 1

## 2018-07-21 MED ORDER — ALUM & MAG HYDROXIDE-SIMETH 200-200-20 MG/5ML PO SUSP
30.0000 mL | Freq: Four times a day (QID) | ORAL | Status: DC | PRN
  Administered 2018-07-25: 30 mL via ORAL
  Filled 2018-07-21: qty 30

## 2018-07-21 MED ORDER — ASPIRIN 81 MG PO TABS: 81.0000 mg | ORAL_TABLET | Freq: Every day | ORAL | Status: DC

## 2018-07-21 MED ORDER — FAMOTIDINE IN NACL 20-0.9 MG/50ML-% IV SOLN
20.0000 mg | Freq: Two times a day (BID) | INTRAVENOUS | Status: DC
  Administered 2018-07-21 – 2018-07-24 (×6): 20 mg via INTRAVENOUS
  Filled 2018-07-21 (×6): qty 50

## 2018-07-21 MED ORDER — ONDANSETRON HCL 4 MG/2ML IJ SOLN: 4.0000 mg | Freq: Four times a day (QID) | INTRAMUSCULAR | Status: DC | PRN

## 2018-07-21 MED ORDER — PROCHLORPERAZINE EDISYLATE 10 MG/2ML IJ SOLN
10.0000 mg | Freq: Once | INTRAMUSCULAR | Status: AC
  Administered 2018-07-21: 10 mg via INTRAVENOUS
  Filled 2018-07-21: qty 2

## 2018-07-21 MED ORDER — MAGNESIUM SULFATE 2 GM/50ML IV SOLN
2.0000 g | Freq: Once | INTRAVENOUS | Status: AC
  Administered 2018-07-21: 2 g via INTRAVENOUS
  Filled 2018-07-21: qty 50

## 2018-07-21 MED ORDER — FLUOXETINE HCL 20 MG PO TABS: 60.0000 mg | ORAL_TABLET | ORAL | Status: DC

## 2018-07-21 MED ORDER — INSULIN ASPART 100 UNIT/ML ~~LOC~~ SOLN
0.0000 [IU] | Freq: Three times a day (TID) | SUBCUTANEOUS | Status: DC
  Administered 2018-07-22 (×2): 8 [IU] via SUBCUTANEOUS
  Administered 2018-07-22: 5 [IU] via SUBCUTANEOUS
  Administered 2018-07-23 (×3): 8 [IU] via SUBCUTANEOUS
  Administered 2018-07-24: 11 [IU] via SUBCUTANEOUS
  Administered 2018-07-24: 5 [IU] via SUBCUTANEOUS
  Administered 2018-07-24: 9 [IU] via SUBCUTANEOUS
  Administered 2018-07-25 (×2): 3 [IU] via SUBCUTANEOUS
  Administered 2018-07-25: 8 [IU] via SUBCUTANEOUS
  Administered 2018-07-26: 3 [IU] via SUBCUTANEOUS
  Administered 2018-07-26: 8 [IU] via SUBCUTANEOUS

## 2018-07-21 MED ORDER — INSULIN ASPART 100 UNIT/ML ~~LOC~~ SOLN
0.0000 [IU] | Freq: Every day | SUBCUTANEOUS | Status: DC
  Administered 2018-07-21: 2 [IU] via SUBCUTANEOUS
  Administered 2018-07-22 – 2018-07-25 (×4): 3 [IU] via SUBCUTANEOUS

## 2018-07-21 MED ORDER — DIPHENHYDRAMINE HCL 50 MG/ML IJ SOLN
25.0000 mg | Freq: Once | INTRAMUSCULAR | Status: AC
  Administered 2018-07-21: 25 mg via INTRAVENOUS
  Filled 2018-07-21: qty 1

## 2018-07-21 MED ORDER — INSULIN PUMP
Freq: Three times a day (TID) | SUBCUTANEOUS | Status: DC
  Filled 2018-07-21: qty 1

## 2018-07-21 MED ORDER — SODIUM CHLORIDE 0.9 % IV BOLUS
1000.0000 mL | Freq: Once | INTRAVENOUS | Status: AC
  Administered 2018-07-21: 1000 mL via INTRAVENOUS

## 2018-07-21 MED ORDER — DOXEPIN HCL 50 MG PO CAPS
50.0000 mg | ORAL_CAPSULE | Freq: Every day | ORAL | Status: DC
  Administered 2018-07-21 – 2018-07-25 (×5): 50 mg via ORAL
  Filled 2018-07-21 (×5): qty 1

## 2018-07-21 MED ORDER — ENOXAPARIN SODIUM 40 MG/0.4ML ~~LOC~~ SOLN
40.0000 mg | SUBCUTANEOUS | Status: DC
  Administered 2018-07-21 – 2018-07-25 (×5): 40 mg via SUBCUTANEOUS
  Filled 2018-07-21 (×5): qty 0.4

## 2018-07-21 MED ORDER — ACETAMINOPHEN 325 MG PO TABS
650.0000 mg | ORAL_TABLET | ORAL | Status: DC | PRN
  Administered 2018-07-23 – 2018-07-26 (×6): 650 mg via ORAL
  Filled 2018-07-21 (×6): qty 2

## 2018-07-21 MED ORDER — POTASSIUM CHLORIDE CRYS ER 20 MEQ PO TBCR
20.0000 meq | EXTENDED_RELEASE_TABLET | Freq: Once | ORAL | Status: AC
  Administered 2018-07-21: 20 meq via ORAL
  Filled 2018-07-21: qty 1

## 2018-07-21 MED ORDER — NITROGLYCERIN 0.4 MG SL SUBL: 0.4000 mg | SUBLINGUAL_TABLET | SUBLINGUAL | Status: DC | PRN

## 2018-07-21 MED ORDER — QUETIAPINE FUMARATE 25 MG PO TABS
150.0000 mg | ORAL_TABLET | Freq: Every day | ORAL | Status: DC
  Administered 2018-07-21 – 2018-07-25 (×5): 150 mg via ORAL
  Filled 2018-07-21 (×4): qty 1
  Filled 2018-07-21: qty 6

## 2018-07-21 MED ORDER — IOPAMIDOL (ISOVUE-370) INJECTION 76%
75.0000 mL | Freq: Once | INTRAVENOUS | Status: AC | PRN
  Administered 2018-07-21: 75 mL via INTRAVENOUS

## 2018-07-21 MED ORDER — LURASIDONE HCL 60 MG PO TABS: 60.0000 mg | ORAL_TABLET | Freq: Every day | ORAL | Status: DC

## 2018-07-21 MED ORDER — PANTOPRAZOLE SODIUM 40 MG PO TBEC: 40.0000 mg | DELAYED_RELEASE_TABLET | ORAL | Status: DC

## 2018-07-21 MED ORDER — ASPIRIN 300 MG RE SUPP: 300.0000 mg | RECTAL | Status: AC

## 2018-07-21 MED ORDER — ASPIRIN 81 MG PO CHEW
81.0000 mg | CHEWABLE_TABLET | Freq: Every day | ORAL | Status: DC
  Administered 2018-07-22 – 2018-07-26 (×5): 81 mg via ORAL
  Filled 2018-07-21 (×5): qty 1

## 2018-07-21 MED ORDER — ASPIRIN 81 MG PO CHEW
324.0000 mg | CHEWABLE_TABLET | ORAL | Status: AC
  Administered 2018-07-21: 324 mg via ORAL
  Filled 2018-07-21: qty 4

## 2018-07-21 NOTE — ED Notes (Signed)
PT denies chest pain.  States headache improving to 6/10 after compazine and benadryl.  Placed on 2L [place] for sats of 89% while sleeping.  Pt ao x 4 when awoken.  Ice placed on L arm and arm elevated.

## 2018-07-21 NOTE — Plan of Care (Signed)
Pt is alert and oriented. Skin warm and dry. Able to communicate needs. Call bell within reach. Will continue monitoring

## 2018-07-21 NOTE — ED Provider Notes (Signed)
Pt signed out by Dr. Billy Fischer pending CT chest and other labs.  Pt does not have a PE, but her IV on her left arm had some extravasation.  Arm iced and elevated.  Pt d/w Dr. Myna Hidalgo for admission for cp.   Isla Pence, MD 07/21/18 [phone]

## 2018-07-21 NOTE — ED Notes (Signed)
Patient transported to CT 

## 2018-07-21 NOTE — H&P (Signed)
History and Physical    KARLA PAVONE GYF:[id number] DOB: 06-26-1966 DOA: 07/21/2018  PCP: Bradly Bienenstock., MD   Patient coming from: Home   Chief Complaint: Heartburn, lethargy, N/V   HPI: Morgan [name] is a 52 y.o. female with medical history significant for hypertension, bipolar disorder, poorly controlled insulin-dependent diabetes mellitus, thyroidism, and admission 1 month ago with C. difficile colitis, now presenting to the emergency department with several days of heartburn, lethargy, and nausea with nonbloody vomiting.  Patient reports that her diarrhea resolved shortly after her hospitalization, but she has had some persistent fatigue and nausea with occasional nonbloody vomiting that is worsened over the past several days.  She also describes "heartburn" with this, described as a soreness in the central chest that has been waxing and waning without any alleviating or exacerbating factors.  She reports some low-grade fevers, but acknowledges she has not taken her temperature.  Reports shortness of breath without cough.  Denies orthopnea or lower extremity swelling or tenderness.  Has some mild generalized abdominal discomfort, but no real pain.  ED Course: Upon arrival to the ED, patient is found to be afebrile, saturating well on room air, tachycardic in the low 100s, and with stable blood pressure.  EKG features a sinus tachycardia with rate 109, PVC, and QTc interval of 538 ms.  Chest x-ray is negative for acute cardiopulmonary disease.  CTA chest is negative for PE, notable for an incidental right lower lobe lung nodule and contrast extravasation into the left upper extremity.  Chemistry panel is notable for a glucose of 405 with normal bicarbonate, mild elevation in transaminases and bilirubin, and creatinine of 1.18, up from 0.76 a month ago.  CBC is notable for leukocytosis to 19,700 and mild thrombocytosis.  Urinalysis features ketones and elevated specific gravity.   Patient was given a liter of normal saline in the ED, Compazine, and Benadryl.  She remains hemodynamically stable and will be observed for further evaluation and management.  Review of Systems:  All other systems reviewed and apart from HPI, are negative.  Past Medical History:  Diagnosis Date  . Anxiety   . Asthma   . Bipolar depression ([place])   . Chronic bronchitis ([place])   . Chronic esophagogastric ulcer   . Chronic stomach ulcer   . Depression   . Fibroid   . GERD (gastroesophageal reflux disease)   . Heart murmur   . History of blood transfusion 2010   "related to subclavian stent"  . History of hiatal hernia   . Hyperlipidemia   . Hypertension   . Hypothyroid   . Migraine    "cerebral migraines; 1-2/month" (06/29/2018)  . 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 Stroke [facility])    "I've had 2; most recent one was ~ 2015 or before; affected balance" (06/29/2018)  . Thalassemia minor   . Type II diabetes mellitus ([place])   . Vitamin D deficiency     Past Surgical History:  Procedure Laterality Date  . ABDOMINAL HYSTERECTOMY  2015   "still have 1 ovary"  . CESAREAN SECTION  1988; 1996  . KNEE RECONSTRUCTION Left 1980  . LAPAROSCOPIC CHOLECYSTECTOMY    . SUBCLAVIAN STENT PLACEMENT Left 2010  . TONSILLECTOMY  1970     reports that she quit smoking about 4 years ago. Her smoking use included cigarettes. She has a 72.00 pack-year smoking history. She has never used smokeless tobacco. She reports previous alcohol use. She reports previous drug use.  Allergies  Allergen Reactions  . Ilosone [Erythromycin] Anaphylaxis  . Keflex [Cephalexin] Anaphylaxis  . Penicillins Anaphylaxis    Has patient had a PCN reaction causing immediate rash, facial/tongue/throat swelling, SOB or lightheadedness with hypotension: YES Has patient had a PCN reaction causing severe rash involving mucus membranes or skin necrosis: NO Has patient had a PCN reaction that required  hospitalizationNO Has patient had a PCN reaction occurring within the last 10 years: NO If all of the above answers are "NO", then may proceed with Cephalosporin use.  . Iron Other (See Comments)    Pt reports condition limiting Iron intake.  . Metformin And Related Other (See Comments)    Pt prefers to not take this medication  . Morphine And Related Other (See Comments)    Pt reports hallucinations    Family History  Problem Relation Age of Onset  . Diabetes Mother   . Hypertension Mother   . Hyperlipidemia Mother   . CAD Mother   . CAD Father   . Heart failure Father   . Hypertension Sister   . Hyperlipidemia Sister   . Hypertension Brother   . Hyperlipidemia Brother      Prior to Admission medications   Medication Sig Start Date End Date Taking? Authorizing Provider  aspirin 81 MG tablet Take 81 mg by mouth daily.    Yes [provider]  Cholecalciferol (VITAMIN D3) 50 MCG (2000 UT) TABS Take 2,000-4,000 Units by mouth See admin instructions. Take 4,000 units by mouth once a day on Mon/Wed/Fri and 2,000 units on Sun/Tues/Thurs/Sat   Yes [provider]  Co-Enzyme Q-10 30 MG CAPS Take 30 mg by mouth daily.    Yes [provider]  doxepin (SINEQUAN) 25 MG capsule Take 50 mg by mouth at bedtime.    Yes [provider]  EPINEPHrine 0.3 mg/0.3 mL IJ SOAJ injection Inject 0.3 mg into the muscle once as needed (for an anaphylactic reaction).  02/08/18  Yes [provider]  FLUoxetine (PROZAC) 20 MG tablet Take 60 mg by mouth every morning.    Yes [provider]  GLUCAGON EMERGENCY 1 MG injection Inject 1 mg into the vein once as needed (FOR ONSET OF HYPOGLYCEMIA).  02/09/18  Yes [provider]  Insulin Human (INSULIN PUMP) SOLN Inject into the skin continuous. Humalog   Yes [provider]  levothyroxine (SYNTHROID, LEVOTHROID) 150 MCG tablet Take 150 mcg by mouth daily.    Yes [provider]  losartan  (COZAAR) 100 MG tablet Take 100 mg by mouth daily.   Yes [provider]  Lurasidone HCl (LATUDA) 60 MG TABS Take 60 mg by mouth daily.   Yes [provider]  Multiple Vitamin (MULTIVITAMIN WITH MINERALS) TABS tablet Take 1 tablet by mouth daily.   Yes [provider]  naproxen sodium (ALEVE) 220 MG tablet Take 440 mg by mouth 2 (two) times daily as needed (for pain).    Yes [provider]  pantoprazole (PROTONIX) 20 MG tablet Take 40 mg by mouth every morning.    Yes [provider]  QUEtiapine (SEROQUEL) 50 MG tablet Take 150 mg by mouth at bedtime.   Yes [provider]  Semaglutide (OZEMPIC, 0.25 OR 0.5 MG/DOSE, [place]) Inject 0.5 mg into the skin every Monday.    Yes [provider]  Blood Glucose Monitoring Suppl (CONTOUR NEXT ONE) KIT 1 kit by Does not apply route as directed. 07/02/18   Patrecia Pour, MD  Physical Exam: Vitals:   07/21/18 1309 07/21/18 1315 07/21/18 1320 07/21/18 1330  BP: 118/71 117/62  (!) 112/53  Pulse: (!) 111 (!) 130  (!) 108  Resp: '16 18  17  ' Temp: 99.2 F (37.3 C)     TempSrc: Oral     SpO2: 98% 94%  96%  Weight:   113 kg   Height:   '5\' 6"'  (1.676 m)     Constitutional: NAD, calm  Eyes: PERTLA, lids and conjunctivae normal ENMT: Mucous membranes are moist. Posterior pharynx clear of any exudate or lesions.   Neck: normal, supple, no masses, no thyromegaly Respiratory: clear to auscultation bilaterally, no wheezing, no crackles. Normal respiratory effort.   Cardiovascular: S1 & S2 heard, regular rate and rhythm. No extremity edema.  Abdomen: No distension, mild generalized discomfort without rebound pain or guarding, soft. Bowel sounds active.  Musculoskeletal: no clubbing / cyanosis. No joint deformity upper and lower extremities.    Skin: no significant rashes, lesions, ulcers. Warm, dry, well-perfused. Neurologic: no facial asymmetry. Sensation intact. Moving all extremities.  Psychiatric:  Alert and oriented to person, place, and situation. Calm, cooperative.    Labs on Admission: I have personally reviewed following labs and imaging studies  CBC: Recent Labs  Lab 07/21/18 1333  WBC 19.7*  HGB 13.9  HCT 45.3  MCV 65.5*  PLT 383*   Basic Metabolic Panel: Recent Labs  Lab 07/21/18 1333  NA 131*  K 3.7  CL 91*  CO2 24  GLUCOSE 405*  BUN 29*  CREATININE 1.18*  CALCIUM 8.9   GFR: Estimated Creatinine Clearance: 69.5 mL/min (A) (by C-G formula based on SCr of 1.18 mg/dL (H)). Liver Function Tests: Recent Labs  Lab 07/21/18 1333  AST 95*  ALT 95*  ALKPHOS 76  BILITOT 1.6*  PROT 6.8  ALBUMIN 3.6   No results for input(s): LIPASE, AMYLASE in the last 168 hours. No results for input(s): AMMONIA in the last 168 hours. Coagulation Profile: No results for input(s): INR, PROTIME in the last 168 hours. Cardiac Enzymes: No results for input(s): CKTOTAL, CKMB, CKMBINDEX, TROPONINI in the last 168 hours. BNP (last 3 results) No results for input(s): PROBNP in the last 8760 hours. HbA1C: No results for input(s): HGBA1C in the last 72 hours. CBG: Recent Labs  Lab 07/21/18 1346  GLUCAP 383*   Lipid Profile: No results for input(s): CHOL, HDL, LDLCALC, TRIG, CHOLHDL, LDLDIRECT in the last 72 hours. Thyroid Function Tests: No results for input(s): TSH, T4TOTAL, FREET4, T3FREE, THYROIDAB in the last 72 hours. Anemia Panel: No results for input(s): VITAMINB12, FOLATE, FERRITIN, TIBC, IRON, RETICCTPCT in the last 72 hours. Urine analysis:    Component Value Date/Time   COLORURINE YELLOW 07/21/2018 1904   APPEARANCEUR HAZY (A) 07/21/2018 1904   LABSPEC 1.039 (H) 07/21/2018 1904   PHURINE 6.0 07/21/2018 1904   GLUCOSEU 150 (A) 07/21/2018 1904   HGBUR NEGATIVE 07/21/2018 1904   BILIRUBINUR NEGATIVE 07/21/2018 1904   KETONESUR 5 (A) 07/21/2018 1904   PROTEINUR NEGATIVE 07/21/2018 1904   UROBILINOGEN 0.2 08/03/2013 2352   NITRITE NEGATIVE 07/21/2018 1904    LEUKOCYTESUR NEGATIVE 07/21/2018 1904   Sepsis Labs: '@LABRCNTIP' (procalcitonin:4,lacticidven:4) )No results found for this or any previous visit (from the past 240 hour(s)).   Radiological Exams on Admission: Dg Chest 2 View  Result Date: 07/21/2018 CLINICAL DATA:  Heartburn EXAM: CHEST - 2 VIEW COMPARISON:  06/27/2018 FINDINGS: The heart size and mediastinal contours are within normal limits. Both lungs are clear. Stent  cephalad to the aortic arch IMPRESSION: No active cardiopulmonary disease. Electronically Signed   By: Donavan Foil M.D.   On: 07/21/2018 15:57   Ct Angio Chest Pe W And/or Wo Contrast  Result Date: 07/21/2018 CLINICAL DATA:  Chest pain.  PE suspected. EXAM: CT ANGIOGRAPHY CHEST WITH CONTRAST TECHNIQUE: Multidetector CT imaging of the chest was performed using the standard protocol during bolus administration of intravenous contrast. Multiplanar CT image reconstructions and MIPs were obtained to evaluate the vascular anatomy. There was extravasation of contrast at the end of the injection. Opacification of pulmonary arteries is diagnostic. Exam the patient. The area of the extravasation in the left forearm was blue. Equal compress was being applied. The area measured 3 x 8 cm. I outlined the area on the patient's skin. Distal sensation and blood flow was intact. CONTRAST:  80m ISOVUE-370 IOPAMIDOL (ISOVUE-370) INJECTION 76% COMPARISON:  Two-view chest x-ray 07/21/2018 FINDINGS: Cardiovascular: The heart size is normal. Coronary artery calcifications are present. Atherosclerotic changes are noted in the aorta and at the origins the great vessels. A stent is in place at the left subclavian artery. Pulmonary artery opacification is satisfactory. No focal filling defects are present to suggest pulmonary embolus. Mediastinum/Nodes: No significant mediastinal, hilar, or axillary adenopathy is present. Esophagus is normal. Thoracic inlet is within normal limits. Lungs/Pleura: Scattered  ground-glass attenuation is present bilaterally. A 7 x 4 x 4 mm pulmonary nodule is noted along the major fissure in the right lower lobe. No other focal nodule or mass lesion is present. There is no significant pleural effusion. Upper Abdomen: Upper abdomen is remarkable for cholecystectomy. No solid organ lesions are present. Musculoskeletal: Vertebral body heights are maintained. A hemangioma is present at T2. Marrow density is otherwise normal. Sternum is intact. Ribs are within normal limits. Review of the MIP images confirms the above findings. IMPRESSION: 1. No pulmonary embolus. 2. Contrast extravasation into the left upper extremity. Recommend core compress and elevation. Recommend continued surveillance. Patient is at low risk for complication. 3. Right lower lobe pulmonary nodule associated with the minor fissure. Me in size of the nodule is 5.5 mm. Non-contrast chest CT at 6-12 months is recommended. If the nodule is stable at time of repeat CT, then future CT at 18-24 months (from today's scan) is considered optional for low-risk patients, but is recommended for high-risk patients. This recommendation follows the consensus statement: Guidelines for Management of Incidental Pulmonary Nodules Detected on CT Images: From the Fleischner Society 2017; Radiology 2017; 284:228-243. 4. Left subclavian artery stent. 5.  Aortic Atherosclerosis (ICD10-I70.0). Electronically Signed   By: CSan MorelleM.D.   On: 07/21/2018 18:46    EKG: Independently reviewed. Sinus tachycardia (rate 109), PVC, QTc 538 ms.   Assessment/Plan   1. Chest discomfort  - Presents with several days of chest discomfort with nausea occasional non-bloody vomiting, and fatigue  - She was ruled-out for PE with CTA in ED, troponin was 0.01  - Presentation most suggestive of GI etiology, though she has risk factors for CAD that include HTN and poorly-controlled DM  - Continue cardiac monitoring, obtain serial troponin  measurements, repeat EKG, give ASA 324, start Pepcid and as-needed GI-cocktail    2. Leukocytosis  - WBC is 19,700 on admission without fever, appears to have been consistently elevated since June, recent C diff with reported resolution in diarrhea  - CXR clear and UA not suggestive of infection  - She has ongoing N/V, abd exam is benign, there is new mild  hyperbilirubinemia and mild transaminase elevations, will check RUQ [place] and culture if febrile    3. Mild renal insufficiency  - SCr is 1.18 on admission, up from 0.76 on 07/02/18  - Likely dehydrated in setting of recent C diff and persistent N/V  - Given a liter of NS in ED, continued on NS infusion, losartan held, repeat chemistries in am   4. Insulin-dependent DM  - A1c was 9.8% last month  - She is managed with insulin pump at home  - Serum glucose is 405 on admission without DKA  - Continue pump for now with close monitoring and extra bolus for hyperglycemia, consult with diabetes coordinator    5. Hypertension  - BP at goal  - Losartan held on admission in light of increased creatinine    6. Hypothyroidism  - TSH and T4 normal <1 month ago  - Continue Synthroid    7. Bipolar disorder  - Stable  - Continue doxepin, Seroquel, Latuda, Prozac with close monitoring of QT interval    8. Incidental lung nodule  - 5.5 mm RLL nodule noted on CTA chest  - Follow-up CT recommended in 6-12 months    9. Contrast extravasation  - Extravasation of contrast noted in LUE on CTA - Continue cold compress, elevation, monitoring    10. Prolonged QT interval  - QTc is 538 ms on admission EKG  - Continue cardiac monitoring, replace K to 4 and mag to 2, minimize QT-prolonging medications, repeat EKG in am     DVT prophylaxis: Lovenox  Code Status: Full  Family Communication: Discussed with patient  Consults called: None Admission status: Observation     Vianne Bulls, MD [facility] Pager [phone]  If 7PM-7AM, please  contact night-coverage [email or web address] Password [email or web address]  07/21/2018, 7:50 PM

## 2018-07-21 NOTE — ED Provider Notes (Signed)
[facility] EMERGENCY DEPARTMENT Provider Note   CSN: [id number] Arrival date & time: 07/21/18  1304     History   Chief Complaint Chief Complaint  Patient presents with  . Chest Pain  . Weakness    HPI Morgan [name] is a 52 y.o. female.  HPI   Presenting with pain, diffuse, in chest,headache. Feels like the chest pain radiating to ears Chest pain started around 4 hours prior to arrival. Feels like severe heart burn that radiates into ears.  Feels like being punched. When she has it arms feel very heavy.  Severe fatigue.   Nothing makes the pain better or worse, not positional, not better drinking fluids. Fatigue worse with exertion, pain not necessarily.   Feels short of breath with fatigue, dyspnea on exertion.   Fatigue has been going on since emesis started on Wednesday, threw up almost every hour, Thurs had dry heaves and headache. Today has been dry heaves and pains.   No abdominal pain, no diarrhea, constipation, Not vomiting blood.   No blood in stool prior. Urine is tea color, has lower back pain with urination.   No cough.  Runny nose. Low grade 99.6., no chills.   Reports fatigue and heavy arms with prior stroke, no numbness/weakness focal, no difficulty talking or walking, no hcange in vision    Past Medical History:  Diagnosis Date  . Anxiety   . Asthma   . Bipolar depression ([place])   . Chronic bronchitis ([place])   . Chronic esophagogastric ulcer   . Chronic stomach ulcer   . Depression   . Fibroid   . GERD (gastroesophageal reflux disease)   . Heart murmur   . History of blood transfusion 2010   "related to subclavian stent"  . History of hiatal hernia   . Hyperlipidemia   . Hypertension   . Hypothyroid   . Migraine    "cerebral migraines; 1-2/month" (06/29/2018)  . 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 Stroke [facility])    "I've had 2; most recent one was ~ 2015 or before; affected balance" (06/29/2018)  . Thalassemia minor    . Type II diabetes mellitus ([place])   . Vitamin D deficiency     Patient Active Problem List   Diagnosis Date Noted  . Sepsis ([place]) 06/27/2018  . Hypokalemia 06/27/2018  . Diabetes mellitus type 2 in obese ([place]) 06/27/2018  . AKI (acute kidney injury) ([place]) 06/27/2018  . Acute metabolic encephalopathy 06/04/1593  . Chest pain, rule out acute myocardial infarction 05/14/2016  . Chest pain 05/14/2016  . Hyperlipidemia 05/14/2016  . Hypothyroidism 05/14/2016  . Essential hypertension 05/14/2016    Past Surgical History:  Procedure Laterality Date  . ABDOMINAL HYSTERECTOMY  2015   "still have 1 ovary"  . CESAREAN SECTION  1988; 1996  . KNEE RECONSTRUCTION Left 1980  . LAPAROSCOPIC CHOLECYSTECTOMY    . SUBCLAVIAN STENT PLACEMENT Left 2010  . TONSILLECTOMY  1970     OB History    Gravida  6   Para  2   Term  2   Preterm      AB  4   Living  2     SAB  4   TAB      Ectopic      Multiple      Live Births               Home Medications    Prior to Admission medications  Medication Sig Start Date End Date Taking? Authorizing Provider  aspirin 81 MG tablet Take 81 mg by mouth every morning.     [provider]  Aspirin-Salicylamide-Caffeine (BC HEADACHE POWDER PO) Take 1 Package by mouth every 6 (six) hours as needed (dental pain).    [provider]  Blood Glucose Monitoring Suppl (CONTOUR NEXT ONE) KIT 1 kit by Does not apply route as directed. 07/02/18   Patrecia Pour, MD  Cholecalciferol (VITAMIN D) 2000 units CAPS Take 2,000-4,000 Units by mouth See admin instructions. 4,000units daily on Mon/Wed/Fri and 2,000units daily on all other days    [provider]  Co-Enzyme Q-10 30 MG CAPS Take 30 mg by mouth daily.     [provider]  doxepin (SINEQUAN) 25 MG capsule Take 50 mg by mouth at bedtime.     [provider]  EPINEPHrine 0.3 mg/0.3 mL IJ SOAJ injection Inject 0.3 mg into the muscle once as needed.  02/08/18   [provider]  FLUoxetine (PROZAC) 20 MG tablet Take 60 mg by mouth every morning.     [provider]  GLUCAGON EMERGENCY 1 MG injection Inject 1 mg into the vein once as needed. USE AT ONSET OF HYPOGLYCEMIA 02/09/18   [provider]  insulin lispro (HUMALOG) 100 UNIT/ML injection Inject 200 Units into the skin daily. Using in pump 09/06/17   [provider]  LATUDA 40 MG TABS tablet Take 40 mg by mouth daily.  06/21/18   [provider]  levothyroxine (SYNTHROID, LEVOTHROID) 150 MCG tablet Take 150 mcg by mouth daily.     [provider]  losartan (COZAAR) 100 MG tablet Take 100 mg by mouth daily.    [provider]  Multiple Vitamin (MULTIVITAMIN WITH MINERALS) TABS tablet Take 1 tablet by mouth daily.    [provider]  naproxen sodium (ALEVE) 220 MG tablet Take 440 mg by mouth 2 (two) times daily as needed (PAIN).    [provider]  pantoprazole (PROTONIX) 20 MG tablet Take 40 mg by mouth every morning.     [provider]  QUEtiapine (SEROQUEL) 50 MG tablet Take 150 mg by mouth at bedtime.    [provider]  Semaglutide (OZEMPIC, 0.25 OR 0.5 MG/DOSE, [place]) Inject 0.5 mg into the skin every Monday.     [provider]    Family History Family History  Problem Relation Age of Onset  . Diabetes Mother   . Hypertension Mother   . Hyperlipidemia Mother   . CAD Mother   . CAD Father   . Heart failure Father   . Hypertension Sister   . Hyperlipidemia Sister   . Hypertension Brother   . Hyperlipidemia Brother     Social History Social History   Tobacco Use  . Smoking status: Former Smoker    Packs/day: 2.00    Years: 36.00    Pack years: 72.00    Types: Cigarettes    Last attempt to quit: 05/14/2014    Years since quitting: 4.1  . Smokeless tobacco: Never Used  Substance Use Topics  . Alcohol use: Not Currently  . Drug use: Not Currently     Allergies     Ilosone [erythromycin]; Keflex [cephalexin]; Penicillins; Iron; Metformin and related; and Morphine and related   Review of Systems Review of Systems  Constitutional: Positive for activity change, appetite change and fatigue. Negative for fever.  HENT: Positive for congestion, rhinorrhea and sore throat (with vomiting).  Eyes: Negative for visual disturbance.  Respiratory: Negative for cough and shortness of breath.   Cardiovascular: Positive for chest pain.  Gastrointestinal: Positive for nausea and vomiting (dry heaves today). Negative for abdominal pain, constipation and diarrhea.  Genitourinary: Positive for flank pain (with urination). Negative for difficulty urinating and dysuria.  Musculoskeletal: Positive for back pain. Negative for neck pain.  Skin: Negative for rash.  Neurological: Positive for headaches. Negative for dizziness, syncope, facial asymmetry, weakness and light-headedness.     Physical Exam Updated Vital Signs BP 118/71 (BP Location: Right Arm)   Pulse (!) 111   Temp 99.2 F (37.3 C) (Oral)   Resp 16   Ht '5\' 6"'  (1.676 m)   Wt 113 kg   LMP 08/03/2013   SpO2 98%   BMI 40.21 kg/m   Physical Exam Vitals signs and nursing note reviewed.  Constitutional:      General: She is not in acute distress.    Appearance: She is well-developed. She is obese. She is not diaphoretic.  HENT:     Head: Normocephalic and atraumatic.  Eyes:     Conjunctiva/sclera: Conjunctivae normal.  Neck:     Musculoskeletal: Normal range of motion.  Cardiovascular:     Rate and Rhythm: Regular rhythm. Tachycardia present.     Heart sounds: Normal heart sounds. No murmur. No friction rub. No gallop.   Pulmonary:     Effort: Pulmonary effort is normal. No respiratory distress.     Breath sounds: Normal breath sounds. No wheezing or rales.  Abdominal:     General: There is no distension.     Palpations: Abdomen is soft.     Tenderness: There is no abdominal tenderness. There  is no guarding.  Musculoskeletal:        General: No tenderness.  Skin:    General: Skin is warm and dry.     Findings: No erythema or rash.  Neurological:     Mental Status: She is alert and oriented to person, place, and time.      ED Treatments / Results  Labs (all labs ordered are listed, but only abnormal results are displayed) Labs Reviewed  CBC - Abnormal; Notable for the following components:      Result Value   WBC 19.7 (*)    RBC 6.92 (*)    MCV 65.5 (*)    MCH 20.1 (*)    RDW 17.8 (*)    Platelets 423 (*)    All other components within normal limits  COMPREHENSIVE METABOLIC PANEL - Abnormal; Notable for the following components:   Sodium 131 (*)    Chloride 91 (*)    Glucose, Bld 405 (*)    BUN 29 (*)    Creatinine, Ser 1.18 (*)    AST 95 (*)    ALT 95 (*)    Total Bilirubin 1.6 (*)    GFR calc non Af Amer 52 (*)    Anion gap 16 (*)    All other components within normal limits  D-DIMER, QUANTITATIVE (NOT AT [facility]) - Abnormal; Notable for the following components:   D-Dimer, Quant 1.10 (*)    All other components within normal limits  CBG MONITORING, ED - Abnormal; Notable for the following components:   Glucose-Capillary 383 (*)    All other components within normal limits  URINE CULTURE  URINALYSIS, ROUTINE W REFLEX MICROSCOPIC  D-DIMER, QUANTITATIVE (NOT AT [facility])  I-STAT TROPONIN, ED    EKG EKG Interpretation  Date/Time:  Friday  July 21 2018 13:07:12 EST Ventricular Rate:  109 PR Interval:    QRS Duration: 91 QT Interval:  399 QTC Calculation: 538 R Axis:   34 Text Interpretation:  Sinus tachycardia Ventricular premature complex Aberrant complex Prolonged QT interval No significant change since last tracing Confirmed by Gareth Morgan [phone]) on 07/21/2018 1:47:03 PM   Radiology Dg Chest 2 View  Result Date: 07/21/2018 CLINICAL DATA:  Heartburn EXAM: CHEST - 2 VIEW COMPARISON:  06/27/2018 FINDINGS: The heart size and mediastinal  contours are within normal limits. Both lungs are clear. Stent cephalad to the aortic arch IMPRESSION: No active cardiopulmonary disease. Electronically Signed   By: Donavan Foil M.D.   On: 07/21/2018 15:57    Procedures Procedures (including critical care time)  Medications Ordered in ED Medications  prochlorperazine (COMPAZINE) injection 10 mg (has no administration in time range)  diphenhydrAMINE (BENADRYL) injection 25 mg (has no administration in time range)  sodium chloride 0.9 % bolus 1,000 mL (1,000 mLs Intravenous New Bag/Given 07/21/18 1445)     Initial Impression / Assessment and Plan / ED Course  I have reviewed the triage vital signs and the nursing notes.  Pertinent labs & imaging results that were available during my care of the patient were reviewed by me and considered in my medical decision making (see chart for details).     52 y.o.femalewith a history of HTN, HLD, IDDM on insulin pump, and hypothyroidism who presents with chest pain, shortness of breath, severe fatigue, nausea and vomiting. Abd exam benign. NO cough, doubt flu. Has headache, no focal neuro symptoms or trauma, slow onset, doubt ICH, meningitis or other. Initial EKG and troponin without acute findings.   Plan to evaluate for PE given dyspnea, tachycardia, recent hospitalization.  UA pending for nausea, vomiting, fatigue, flank pain with urination.  Will plan on admission for chest pain evaluation if these studies are negative given significant dyspnea, fatigue on exertion with nausea and chest pain.  Final Clinical Impressions(s) / ED Diagnoses   Final diagnoses:  Generalized weakness  Chest pain, unspecified type  Nausea and vomiting, intractability of vomiting not specified, unspecified vomiting type  Shortness of breath  Other fatigue    ED Discharge Orders    None       Gareth Morgan, MD 07/21/18 1640

## 2018-07-21 NOTE — ED Triage Notes (Addendum)
Pt here from home via [facility] for heartburn today and lethargy for several days.  PT states when heartburn hits, the pain radiates to both ears and they begin ringing.  PT states no relief with tums. ekg st.  cbg 479.  Given 250 ml given en-route.  20 L wrist.  Hr 114, 96% RA , bp 122/66, rr 18.

## 2018-07-22 ENCOUNTER — Encounter (HOSPITAL_COMMUNITY): Payer: Self-pay | Admitting: Internal Medicine

## 2018-07-22 ENCOUNTER — Observation Stay (HOSPITAL_COMMUNITY): Payer: 59

## 2018-07-22 ENCOUNTER — Other Ambulatory Visit (HOSPITAL_COMMUNITY): Payer: 59

## 2018-07-22 DIAGNOSIS — R0789 Other chest pain: Secondary | ICD-10-CM | POA: Diagnosis not present

## 2018-07-22 DIAGNOSIS — K760 Fatty (change of) liver, not elsewhere classified: Secondary | ICD-10-CM | POA: Diagnosis present

## 2018-07-22 DIAGNOSIS — R531 Weakness: Secondary | ICD-10-CM

## 2018-07-22 DIAGNOSIS — I951 Orthostatic hypotension: Secondary | ICD-10-CM | POA: Diagnosis not present

## 2018-07-22 DIAGNOSIS — R000 Tachycardia, unspecified: Secondary | ICD-10-CM | POA: Diagnosis not present

## 2018-07-22 DIAGNOSIS — F319 Bipolar disorder, unspecified: Secondary | ICD-10-CM | POA: Diagnosis not present

## 2018-07-22 DIAGNOSIS — E1169 Type 2 diabetes mellitus with other specified complication: Secondary | ICD-10-CM | POA: Diagnosis not present

## 2018-07-22 LAB — CBC WITH DIFFERENTIAL/PLATELET
BASOS PCT: 1 %
Band Neutrophils: 0 %
Basophils Absolute: 0.1 10*3/uL (ref 0.0–0.1)
Blasts: 0 %
EOS PCT: 3 %
Eosinophils Absolute: 0.4 10*3/uL (ref 0.0–0.5)
HCT: 40 % (ref 36.0–46.0)
Hemoglobin: 12.2 g/dL (ref 12.0–15.0)
Lymphocytes Relative: 37 %
Lymphs Abs: 4.9 10*3/uL — ABNORMAL HIGH (ref 0.7–4.0)
MCH: 20 pg — ABNORMAL LOW (ref 26.0–34.0)
MCHC: 30.5 g/dL (ref 30.0–36.0)
MCV: 65.5 fL — ABNORMAL LOW (ref 80.0–100.0)
MYELOCYTES: 0 %
Metamyelocytes Relative: 0 %
Monocytes Absolute: 0.9 10*3/uL (ref 0.1–1.0)
Monocytes Relative: 7 %
Neutro Abs: 6.9 10*3/uL (ref 1.7–7.7)
Neutrophils Relative %: 52 %
Other: 0 %
PLATELETS: 340 10*3/uL (ref 150–400)
Promyelocytes Relative: 0 %
RBC: 6.11 MIL/uL — ABNORMAL HIGH (ref 3.87–5.11)
RDW: 17.2 % — ABNORMAL HIGH (ref 11.5–15.5)
WBC: 13.2 10*3/uL — ABNORMAL HIGH (ref 4.0–10.5)
nRBC: 0 % (ref 0.0–0.2)
nRBC: 0 /100 WBC

## 2018-07-22 LAB — COMPREHENSIVE METABOLIC PANEL
ALT: 88 U/L — ABNORMAL HIGH (ref 0–44)
AST: 92 U/L — ABNORMAL HIGH (ref 15–41)
Albumin: 3.1 g/dL — ABNORMAL LOW (ref 3.5–5.0)
Alkaline Phosphatase: 65 U/L (ref 38–126)
Anion gap: 12 (ref 5–15)
BUN: 28 mg/dL — ABNORMAL HIGH (ref 6–20)
CALCIUM: 8.3 mg/dL — AB (ref 8.9–10.3)
CO2: 23 mmol/L (ref 22–32)
Chloride: 100 mmol/L (ref 98–111)
Creatinine, Ser: 1.09 mg/dL — ABNORMAL HIGH (ref 0.44–1.00)
GFR calc non Af Amer: 58 mL/min — ABNORMAL LOW (ref >60)
Glucose, Bld: 269 mg/dL — ABNORMAL HIGH (ref 70–99)
Potassium: 3.6 mmol/L (ref 3.5–5.1)
Sodium: 135 mmol/L (ref 135–145)
Total Bilirubin: 1.6 mg/dL — ABNORMAL HIGH (ref 0.3–1.2)
Total Protein: 5.9 g/dL — ABNORMAL LOW (ref 6.5–8.1)

## 2018-07-22 LAB — GLUCOSE, CAPILLARY
GLUCOSE-CAPILLARY: 288 mg/dL — AB (ref 70–99)
GLUCOSE-CAPILLARY: 261 mg/dL — AB (ref 70–99)
Glucose-Capillary: 269 mg/dL — ABNORMAL HIGH (ref 70–99)
Glucose-Capillary: 267 mg/dL — ABNORMAL HIGH (ref 70–99)
Glucose-Capillary: 298 mg/dL — ABNORMAL HIGH (ref 70–99)
Glucose-Capillary: 271 mg/dL — ABNORMAL HIGH (ref 70–99)
Glucose-Capillary: 240 mg/dL — ABNORMAL HIGH (ref 70–99)

## 2018-07-22 LAB — MAGNESIUM: Magnesium: 2.7 mg/dL — ABNORMAL HIGH (ref 1.7–2.4)

## 2018-07-22 LAB — URINE CULTURE

## 2018-07-22 LAB — TROPONIN I: Troponin I: <0.03 ng/mL (ref <0.03)

## 2018-07-22 MED ORDER — BUTALBITAL-APAP-CAFFEINE 50-325-40 MG PO TABS
2.0000 | ORAL_TABLET | Freq: Once | ORAL | Status: AC
  Administered 2018-07-22: 2 via ORAL
  Filled 2018-07-22: qty 2

## 2018-07-22 MED ORDER — NYSTATIN 100000 UNIT/ML MT SUSP
5.0000 mL | Freq: Four times a day (QID) | OROMUCOSAL | Status: DC
  Administered 2018-07-22 – 2018-07-24 (×9): 500000 [IU] via ORAL
  Filled 2018-07-22 (×8): qty 5

## 2018-07-22 MED ORDER — SODIUM CHLORIDE 0.9 % IV BOLUS
500.0000 mL | Freq: Once | INTRAVENOUS | Status: AC
  Administered 2018-07-22: 250 mL via INTRAVENOUS

## 2018-07-22 MED ORDER — SODIUM CHLORIDE 0.9 % IV SOLN
INTRAVENOUS | Status: AC
  Administered 2018-07-22 – 2018-07-23 (×2): via INTRAVENOUS

## 2018-07-22 MED ORDER — INSULIN GLARGINE 100 UNIT/ML ~~LOC~~ SOLN
20.0000 [IU] | Freq: Every day | SUBCUTANEOUS | Status: DC
  Administered 2018-07-22 – 2018-07-24 (×3): 20 [IU] via SUBCUTANEOUS
  Filled 2018-07-22 (×4): qty 0.2

## 2018-07-22 MED ORDER — INSULIN ASPART 100 UNIT/ML ~~LOC~~ SOLN
4.0000 [IU] | Freq: Three times a day (TID) | SUBCUTANEOUS | Status: DC
  Administered 2018-07-22 – 2018-07-23 (×4): 4 [IU] via SUBCUTANEOUS

## 2018-07-22 NOTE — Plan of Care (Signed)
  Problem: Activity: Goal: Risk for activity intolerance will decrease Outcome: Adequate for Discharge   

## 2018-07-22 NOTE — Progress Notes (Signed)
Inpatient Diabetes Program Recommendations  AACE/ADA: New Consensus Statement on Inpatient Glycemic Control (2015)  Target Ranges:  Prepandial:   less than 140 mg/dL      Peak postprandial:   less than 180 mg/dL (1-2 hours)      Critically ill patients:  140 - 180 mg/dL   Lab Results  Component Value Date   GLUCAP 269 (H) 07/22/2018   HGBA1C 9.8 (H) 06/27/2018    Review of Glycemic Control Results for Morgan [name], Morgan [name] (MRN [id number]) as of 07/22/2018 08:44  Ref. Range 07/21/2018 13:46 07/21/2018 20:58 07/22/2018 02:09  Glucose-Capillary Latest Ref Range: 70 - 99 mg/dL 383 (H) 207 (H) 269 (H)   Diabetes history: Type 2 DM Outpatient Diabetes medications: Insulin pump- Humalog, Ozempic 0.25 mg Qweek Current orders for Inpatient glycemic control: Novolog 0-15 units TID, Novolog 0-5 units QHS  Inpatient Diabetes Program Recommendations:    Patient is followed by Dr Denton Lank, endocrinology with last visit from 06/23/18. Last insulin pump rates were: Current Pump settings: 0000-0700: 3.0 u/hr 0700-0900: 3:0 u/hr 0900-0000: 10 u/hr  ICR- 12 ISF- 20  Per his note: "She is not checking her blood sugar consistently, some days does not check at all. Attributes her poor adherence to depression. Is not often bolusing for meals, which is likely the reason for her very high basal rates."  Spoke with RN and patient does not have supplies with her while inpatient and the insulin pump is not connected. Rn to inform patient not to apply while inpatient since receiving insulin per subQ and to anticipate order changes.   Recommending Lantus 20 units QD (104 kg x 0.2) and Novolog 4 units TID (assuming patient is eating >50% of meals).  Thanks, Bronson Curb, MSN, RNC-OB Diabetes Coordinator [phone] (8a-5p)

## 2018-07-22 NOTE — Progress Notes (Signed)
Pt assisted to bathroom by RN. Pt was very dizzy upon changing position from lying to sitting. bp 140/73. Standing bp 105/66, pt very weak and RN had to assist her to stand and keep balance. bp 3 minutes after standing 111/69. Pt needed moderate assist for balance to walk to bathroom. Ellamae Sia

## 2018-07-22 NOTE — Progress Notes (Addendum)
[facility] PROGRESS NOTE  Morgan [name] MOQ:[id number] DOB: 08-Jul-1966 DOA: 07/21/2018 PCP: Morgan [name]., MD  Assessment/Plan: 1. Chest discomfort. Ruled-out for PE with CTA in ED, troponin negative x3. EKG without acute changes. Chest xray no active cardio pulmonary disease. Presentation most suggestive of GI etiology. Query esophagitis as complains of painful swallowing and diabetes poorly controlled. Unable to obtain DG esophagus. Likely needs EGD in future.  -continue pepsid -obtain DG esophagus -nystatin -supportive therapy    2. Leukocytosis  - WBC is 19,700 on admission without fever, appears to have been consistently elevated since June, recent C diff with reported resolution in diarrhea. WBC trending down this am.  CXR clear and UA not suggestive of infection  -monitor closely  3. Mild renal insufficiency  SCr is 1.18 on admission, up from 0.76 on 07/02/18. Likely dehydrated in setting of recent C diff and persistent N/V. Improved this am.  -hold nephrotoxins -monitor urine output -recheck in am  4. Insulin-dependent DM  Uncontrolled.  A1c was 9.8% last month. She is managed with insulin pump at home. serum glucose 269 this am -appreciate Diabetes coordinator assistance.  -add lantus -add meal coverage  5. Hypertension Orthostatic today. Likely multifactorial I.e. volume and dehydration secondary to decreased oral intake related to painful swallowing in setting of self diuresing due to hyperglycemia. Also recent cdiff diagnosis.  Losartan held on admission in light of increased creatinine   -will bolus 500ccNS -provide NS 75cc/hr for 16 hours -check orthostatics in am -bmet in am  6. Hypothyroidism  - TSH and T4 normal <1 month ago  - Continue Synthroid    7. Bipolar disorder  - Stable  - Continue doxepin, Seroquel, Latuda, Prozac with close monitoring of QT interval    8. Incidental lung nodule  - 5.5 mm RLL nodule noted on CTA chest  -  Follow-up CT recommended in 6-12 months    9. Contrast extravasation  - Extravasation of contrast noted in LUE on CTA - Continue cold compress, elevation, monitoring    10. Prolonged QT interval  - QTc is 538 ms on admission EKG  - Continue cardiac monitoring, replace K to 4 and mag  Code Status: full Family Communication: none present Disposition Plan: home hopefully in am   Consultants:  none  Procedures:    Antibiotics:    HPI/Subjective: 52 yo hx htn, bipolar, poorly controlled DM, admitted for cp rule out. Complains today of difficulty swallowing. Concern for esophogitis. Unable to obtain imaging of esophagus on weekends. Has ruled out for acs and PE. Appears orthostatic this afternoon  Fluid bolus, bmet in am. nystatin  Objective: Vitals:   07/22/18 0740 07/22/18 1155  BP: 103/61 122/70  Pulse: (!) 101 97  Resp: 15 15  Temp: 98.5 F (36.9 C) 98.4 F (36.9 C)  SpO2: 92% 95%    Intake/Output Summary (Last 24 hours) at 07/22/2018 1222 Last data filed at 07/22/2018 0200 Gross per 24 hour  Intake 1102.89 ml  Output -  Net 1102.89 ml   Filed Weights   07/21/18 1320 07/21/18 2025 07/22/18 0548  Weight: 113 kg 104.1 kg 104.4 kg    Exam:   General:  Obese lethargic in no acute distress  Cardiovascular: rrr no mgr no LE edema  Respiratory: normal effort BS clear bilaterally no wheeze  Abdomen: obese soft +BS no guarding or rebounding  Musculoskeletal: joints without swelling/erythema   Data Reviewed: Basic Metabolic Panel: Recent Labs  Lab 07/21/18 1333 07/22/18 0428  NA 131* 135  K 3.7 3.6  CL 91* 100  CO2 24 23  GLUCOSE 405* 269*  BUN 29* 28*  CREATININE 1.18* 1.09*  CALCIUM 8.9 8.3*  MG  --  2.7*   Liver Function Tests: Recent Labs  Lab 07/21/18 1333 07/22/18 0428  AST 95* 92*  ALT 95* 88*  ALKPHOS 76 65  BILITOT 1.6* 1.6*  PROT 6.8 5.9*  ALBUMIN 3.6 3.1*   No results for input(s): LIPASE, AMYLASE in the last 168  hours. No results for input(s): AMMONIA in the last 168 hours. CBC: Recent Labs  Lab 07/21/18 1333 07/22/18 0428  WBC 19.7* 13.2*  NEUTROABS  --  6.9  HGB 13.9 12.2  HCT 45.3 40.0  MCV 65.5* 65.5*  PLT 423* 340   Cardiac Enzymes: Recent Labs  Lab 07/21/18 2131 07/22/18 0236  TROPONINI <0.03 <0.03   BNP (last 3 results) No results for input(s): BNP in the last 8760 hours.  ProBNP (last 3 results) No results for input(s): PROBNP in the last 8760 hours.  CBG: Recent Labs  Lab 07/21/18 1346 07/21/18 2058 07/22/18 0209 07/22/18 0737 07/22/18 1050  GLUCAP 383* 207* 269* 267* 298*    No results found for this or any previous visit (from the past 240 hour(s)).   Studies: Dg Chest 2 View  Result Date: 07/21/2018 CLINICAL DATA:  Heartburn EXAM: CHEST - 2 VIEW COMPARISON:  06/27/2018 FINDINGS: The heart size and mediastinal contours are within normal limits. Both lungs are clear. Stent cephalad to the aortic arch IMPRESSION: No active cardiopulmonary disease. Electronically Signed   By: Donavan Foil M.D.   On: 07/21/2018 15:57   Ct Angio Chest Pe W And/or Wo Contrast  Result Date: 07/21/2018 CLINICAL DATA:  Chest pain.  PE suspected. EXAM: CT ANGIOGRAPHY CHEST WITH CONTRAST TECHNIQUE: Multidetector CT imaging of the chest was performed using the standard protocol during bolus administration of intravenous contrast. Multiplanar CT image reconstructions and MIPs were obtained to evaluate the vascular anatomy. There was extravasation of contrast at the end of the injection. Opacification of pulmonary arteries is diagnostic. Exam the patient. The area of the extravasation in the left forearm was blue. Equal compress was being applied. The area measured 3 x 8 cm. I outlined the area on the patient's skin. Distal sensation and blood flow was intact. CONTRAST:  67mL ISOVUE-370 IOPAMIDOL (ISOVUE-370) INJECTION 76% COMPARISON:  Two-view chest x-ray 07/21/2018 FINDINGS: Cardiovascular:  The heart size is normal. Coronary artery calcifications are present. Atherosclerotic changes are noted in the aorta and at the origins the great vessels. A stent is in place at the left subclavian artery. Pulmonary artery opacification is satisfactory. No focal filling defects are present to suggest pulmonary embolus. Mediastinum/Nodes: No significant mediastinal, hilar, or axillary adenopathy is present. Esophagus is normal. Thoracic inlet is within normal limits. Lungs/Pleura: Scattered ground-glass attenuation is present bilaterally. A 7 x 4 x 4 mm pulmonary nodule is noted along the major fissure in the right lower lobe. No other focal nodule or mass lesion is present. There is no significant pleural effusion. Upper Abdomen: Upper abdomen is remarkable for cholecystectomy. No solid organ lesions are present. Musculoskeletal: Vertebral body heights are maintained. A hemangioma is present at T2. Marrow density is otherwise normal. Sternum is intact. Ribs are within normal limits. Review of the MIP images confirms the above findings. IMPRESSION: 1. No pulmonary embolus. 2. Contrast extravasation into the left upper extremity. Recommend core compress and elevation. Recommend continued surveillance. Patient is at  low risk for complication. 3. Right lower lobe pulmonary nodule associated with the minor fissure. Me in size of the nodule is 5.5 mm. Non-contrast chest CT at 6-12 months is recommended. If the nodule is stable at time of repeat CT, then future CT at 18-24 months (from today's scan) is considered optional for low-risk patients, but is recommended for high-risk patients. This recommendation follows the consensus statement: Guidelines for Management of Incidental Pulmonary Nodules Detected on CT Images: From the Fleischner Society 2017; Radiology 2017; 284:228-243. 4. Left subclavian artery stent. 5.  Aortic Atherosclerosis (ICD10-I70.0). Electronically Signed   By: San Morelle M.D.   On: 07/21/2018  18:46   US Abdomen Limited Ruq  Result Date: 07/22/2018 CLINICAL DATA:  Heartburn, nausea, vomiting, elevated LFTs EXAM: ULTRASOUND ABDOMEN LIMITED RIGHT UPPER QUADRANT COMPARISON:  07/21/2018 FINDINGS: Gallbladder: Surgically absent Common bile duct: Diameter: 4 mm Liver: Increased hepatic echogenicity compatible with hepatic steatosis. This limits evaluation for a focal hepatic abnormality. No biliary dilatation. Portal vein is patent on color Doppler imaging with normal direction of blood flow towards the liver. IMPRESSION: Remote cholecystectomy No biliary dilatation or obstruction Hepatic steatosis Electronically Signed   By: Jerilynn Mages.  Shick M.D.   On: 07/22/2018 09:14    Scheduled Meds: . aspirin  81 mg Oral Daily  . doxepin  50 mg Oral QHS  . enoxaparin (LOVENOX) injection  40 mg Subcutaneous Q24H  . insulin aspart  0-15 Units Subcutaneous TID WC  . insulin aspart  0-5 Units Subcutaneous QHS  . insulin aspart  4 Units Subcutaneous TID WC  . insulin glargine  20 Units Subcutaneous Daily  . levothyroxine  150 mcg Oral QAC breakfast  . lurasidone  60 mg Oral Q breakfast  . nystatin  5 mL Oral QID  . QUEtiapine  150 mg Oral QHS   Continuous Infusions: . famotidine (PEPCID) IV 20 mg (07/22/18 0916)    Principal Problem:   Chest pain Active Problems:   Essential hypertension   Diabetes mellitus type 2 in obese ([facility])   Mild renal insufficiency   Hypothyroidism   Elevated transaminase level   Leukocytosis   Incidental lung nodule   Extravasation accident, initial encounter   Prolonged QT interval   LFTs abnormal   Hepatic steatosis   Bipolar depression ([place])    Time spent: 26 minutes    [place] NP [facility]  If 7PM-7AM, please contact night-coverage at [email or web address], password [facility] 07/22/2018, 12:22 PM  LOS: 0 days

## 2018-07-22 NOTE — Progress Notes (Signed)
Patient complains of migraine, NP paged, medication given.

## 2018-07-22 NOTE — Progress Notes (Signed)
Patient placed on enteric precautions 

## 2018-07-23 DIAGNOSIS — E1169 Type 2 diabetes mellitus with other specified complication: Secondary | ICD-10-CM | POA: Diagnosis not present

## 2018-07-23 DIAGNOSIS — R079 Chest pain, unspecified: Secondary | ICD-10-CM | POA: Diagnosis not present

## 2018-07-23 DIAGNOSIS — R1310 Dysphagia, unspecified: Secondary | ICD-10-CM | POA: Diagnosis not present

## 2018-07-23 DIAGNOSIS — E669 Obesity, unspecified: Secondary | ICD-10-CM | POA: Diagnosis not present

## 2018-07-23 LAB — CBC
HCT: 35.5 % — ABNORMAL LOW (ref 36.0–46.0)
Hemoglobin: 10.8 g/dL — ABNORMAL LOW (ref 12.0–15.0)
MCH: 20.1 pg — AB (ref 26.0–34.0)
MCHC: 30.4 g/dL (ref 30.0–36.0)
MCV: 66 fL — ABNORMAL LOW (ref 80.0–100.0)
Platelets: 263 10*3/uL (ref 150–400)
RBC: 5.38 MIL/uL — ABNORMAL HIGH (ref 3.87–5.11)
RDW: 15.9 % — ABNORMAL HIGH (ref 11.5–15.5)
WBC: 7.7 10*3/uL (ref 4.0–10.5)
nRBC: 0 % (ref 0.0–0.2)

## 2018-07-23 LAB — BASIC METABOLIC PANEL
Anion gap: 12 (ref 5–15)
BUN: 15 mg/dL (ref 6–20)
CO2: 20 mmol/L — AB (ref 22–32)
Calcium: 8.1 mg/dL — ABNORMAL LOW (ref 8.9–10.3)
Chloride: 104 mmol/L (ref 98–111)
Creatinine, Ser: 0.96 mg/dL (ref 0.44–1.00)
GFR calc non Af Amer: >60 mL/min (ref >60)
Glucose, Bld: 283 mg/dL — ABNORMAL HIGH (ref 70–99)
Potassium: 3.6 mmol/L (ref 3.5–5.1)
Sodium: 136 mmol/L (ref 135–145)

## 2018-07-23 LAB — GLUCOSE, CAPILLARY
Glucose-Capillary: 266 mg/dL — ABNORMAL HIGH (ref 70–99)
Glucose-Capillary: 266 mg/dL — ABNORMAL HIGH (ref 70–99)
Glucose-Capillary: 286 mg/dL — ABNORMAL HIGH (ref 70–99)

## 2018-07-23 MED ORDER — SODIUM CHLORIDE 0.9 % IV SOLN
INTRAVENOUS | Status: DC
  Administered 2018-07-23 – 2018-07-24 (×2): via INTRAVENOUS

## 2018-07-23 MED ORDER — SODIUM CHLORIDE 0.9 % IV SOLN
INTRAVENOUS | Status: AC
  Administered 2018-07-23 – 2018-07-24 (×2): via INTRAVENOUS

## 2018-07-23 MED ORDER — INSULIN ASPART 100 UNIT/ML ~~LOC~~ SOLN
6.0000 [IU] | Freq: Three times a day (TID) | SUBCUTANEOUS | Status: DC
  Administered 2018-07-23 – 2018-07-25 (×4): 6 [IU] via SUBCUTANEOUS

## 2018-07-23 MED ORDER — TRAMADOL HCL 50 MG PO TABS
50.0000 mg | ORAL_TABLET | Freq: Once | ORAL | Status: AC
  Administered 2018-07-23: 50 mg via ORAL
  Filled 2018-07-23: qty 1

## 2018-07-23 MED ORDER — SODIUM CHLORIDE 0.9 % IV SOLN: INTRAVENOUS | Status: DC

## 2018-07-23 NOTE — Progress Notes (Signed)
[facility] PROGRESS NOTE  Morgan [name] EPP:[id number] DOB: 05-20-1966 DOA: 07/21/2018 PCP: Bradly Bienenstock., MD  Assessment/Plan:  1.Odynophagia/Chest discomfort.Ruled-out for PE with CTA in ED, troponin negative x3. EKG without acute changes. Chest xray no active cardio pulmonary disease. Presentation most suggestive of GI etiology. Query esophagitis vs yeast as complains of painful swallowing and diabetes poorly controlled. having difficulty taking in fluids and food -IV fluids -liquid diet -continue pepsid -obtain DG esophagus -nystatin -supportive therapy -depending on results of DG esophagus tomorrow may need gi consult  2.Leukocytosis -WBC is 19,700 on admission without fever, appears to have been consistently elevated since June, recent C diff with reported resolution in diarrhea. WBC within limits of normal today. no diarrhea.  CXR clear and UA not suggestive of infection -monitor closely  3.Mild renal insufficiencySCr is 1.18 on admission, up from 0.76 on 07/02/18.Likely dehydrated in setting of recent C diff and persistent N/V. Creatinine within limits of normal today  -hold nephrotoxins -monitor urine output -recheck in am  4.Insulin-dependent DM Uncontrolled. A1c was 9.8% last month.She is managed with insulin pump at home.serum glucose 283 this am -appreciate Diabetes coordinator assistance.  -continue lantus at recommended dose -continue SSI at average coverage -will increase meal coverage to 6units  5.Hypertension BP high end of normal today. Home meds include cozaar which is on hold for now.  -monitor BP closely -continue to hold home cozaar -bmet in am  6.Hypothyroidism -TSH and T4 normal <1 month ago -Continue Synthroid  7.Bipolar disorder -Stable -Continue doxepin, Seroquel, Latuda, Prozac with close monitoring of QT interval  8.Incidental lung nodule -5.5 mm RLL nodule noted on CTA  chest -Follow-up CT recommended in 6-12 months   9. Contrast extravasation  - Extravasation of contrast noted in LUE on CTA - Continue cold compress, elevation, monitoring   10. Prolonged QT interval  - QTc is 538 ms on admission EKG  - Continue cardiac monitoring, replace K to 4 and mag  Code Status: full Family Communication: none present Disposition Plan: home tomorrow hopefully   Consultants:    Procedures:    Antibiotics:    HPI/Subjective: Pt complaining of painful swallowing and poor nursing care.  52 yo hx htn, bipolar, poorly controlled DM, admitted for cp rule out. Complains  of difficulty swallowing. Concern for esophogitis vs yeast. Has ruled out for acs and PE. Requested DG esophagus tomorrow depending on results may need gi consult.    Objective: Vitals:   07/23/18 0616 07/23/18 1243  BP: (!) 109/52 131/70  Pulse: 88 90  Resp: 16 (!) 22  Temp: 98.6 F (37 C) 98.5 F (36.9 C)  SpO2: 95% 97%    Intake/Output Summary (Last 24 hours) at 07/23/2018 1440 Last data filed at 07/23/2018 1358 Gross per 24 hour  Intake 2254.28 ml  Output 200 ml  Net 2054.28 ml   Filed Weights   07/21/18 2025 07/22/18 0548 07/23/18 0619  Weight: 104.1 kg 104.4 kg 107.2 kg    Exam:   General:  Sitting up in bed watching TV in no acute distress  Cardiovascular: rrr no mgr no LE edema  Respiratory: normal effort BS clear bilaterally no wheeze  Abdomen: obese soft +BS no guarding or rebounding  Musculoskeletal: joints without swelling/erythema.   Data Reviewed: Basic Metabolic Panel: Recent Labs  Lab 07/21/18 1333 07/22/18 0428 07/23/18 0557  NA 131* 135 136  K 3.7 3.6 3.6  CL 91* 100 104  CO2 24 23 20*  GLUCOSE 405* 269* 283*  BUN 29* 28* 15  CREATININE 1.18* 1.09* 0.96  CALCIUM 8.9 8.3* 8.1*  MG  --  2.7*  --    Liver Function Tests: Recent Labs  Lab 07/21/18 1333 07/22/18 0428  AST 95* 92*  ALT 95* 88*  ALKPHOS 76 65  BILITOT  1.6* 1.6*  PROT 6.8 5.9*  ALBUMIN 3.6 3.1*   No results for input(s): LIPASE, AMYLASE in the last 168 hours. No results for input(s): AMMONIA in the last 168 hours. CBC: Recent Labs  Lab 07/21/18 1333 07/22/18 0428 07/23/18 0557  WBC 19.7* 13.2* 7.7  NEUTROABS  --  6.9  --   HGB 13.9 12.2 10.8*  HCT 45.3 40.0 35.5*  MCV 65.5* 65.5* 66.0*  PLT 423* 340 263   Cardiac Enzymes: Recent Labs  Lab 07/21/18 2131 07/22/18 0236  TROPONINI <0.03 <0.03   BNP (last 3 results) No results for input(s): BNP in the last 8760 hours.  ProBNP (last 3 results) No results for input(s): PROBNP in the last 8760 hours.  CBG: Recent Labs  Lab 07/22/18 1153 07/22/18 1450 07/22/18 1737 07/22/18 2147 07/23/18 0739  GLUCAP 288* 271* 240* 261* 266*    Recent Results (from the past 240 hour(s))  Urine culture     Status: None   Collection Time: 07/21/18  7:04 PM  Result Value Ref Range Status   Specimen Description URINE, RANDOM  Final   Special Requests   Final    NONE Performed at [facility], [place] [address], [place], [place] [address]    Culture   Final    Multiple bacterial morphotypes present, none predominant. Suggest appropriate recollection if clinically indicated.   Report Status 07/22/2018 FINAL  Final     Studies: Dg Chest 2 View  Result Date: 07/21/2018 CLINICAL DATA:  Heartburn EXAM: CHEST - 2 VIEW COMPARISON:  06/27/2018 FINDINGS: The heart size and mediastinal contours are within normal limits. Both lungs are clear. Stent cephalad to the aortic arch IMPRESSION: No active cardiopulmonary disease. Electronically Signed   By: Donavan Foil M.D.   On: 07/21/2018 15:57   Ct Angio Chest Pe W And/or Wo Contrast  Result Date: 07/21/2018 CLINICAL DATA:  Chest pain.  PE suspected. EXAM: CT ANGIOGRAPHY CHEST WITH CONTRAST TECHNIQUE: Multidetector CT imaging of the chest was performed using the standard protocol during bolus administration of intravenous contrast.  Multiplanar CT image reconstructions and MIPs were obtained to evaluate the vascular anatomy. There was extravasation of contrast at the end of the injection. Opacification of pulmonary arteries is diagnostic. Exam the patient. The area of the extravasation in the left forearm was blue. Equal compress was being applied. The area measured 3 x 8 cm. I outlined the area on the patient's skin. Distal sensation and blood flow was intact. CONTRAST:  23mL ISOVUE-370 IOPAMIDOL (ISOVUE-370) INJECTION 76% COMPARISON:  Two-view chest x-ray 07/21/2018 FINDINGS: Cardiovascular: The heart size is normal. Coronary artery calcifications are present. Atherosclerotic changes are noted in the aorta and at the origins the great vessels. A stent is in place at the left subclavian artery. Pulmonary artery opacification is satisfactory. No focal filling defects are present to suggest pulmonary embolus. Mediastinum/Nodes: No significant mediastinal, hilar, or axillary adenopathy is present. Esophagus is normal. Thoracic inlet is within normal limits. Lungs/Pleura: Scattered ground-glass attenuation is present bilaterally. A 7 x 4 x 4 mm pulmonary nodule is noted along the major fissure in the right lower lobe. No other focal nodule or mass lesion is present. There is  no significant pleural effusion. Upper Abdomen: Upper abdomen is remarkable for cholecystectomy. No solid organ lesions are present. Musculoskeletal: Vertebral body heights are maintained. A hemangioma is present at T2. Marrow density is otherwise normal. Sternum is intact. Ribs are within normal limits. Review of the MIP images confirms the above findings. IMPRESSION: 1. No pulmonary embolus. 2. Contrast extravasation into the left upper extremity. Recommend core compress and elevation. Recommend continued surveillance. Patient is at low risk for complication. 3. Right lower lobe pulmonary nodule associated with the minor fissure. Me in size of the nodule is 5.5 mm.  Non-contrast chest CT at 6-12 months is recommended. If the nodule is stable at time of repeat CT, then future CT at 18-24 months (from today's scan) is considered optional for low-risk patients, but is recommended for high-risk patients. This recommendation follows the consensus statement: Guidelines for Management of Incidental Pulmonary Nodules Detected on CT Images: From the Fleischner Society 2017; Radiology 2017; 284:228-243. 4. Left subclavian artery stent. 5.  Aortic Atherosclerosis (ICD10-I70.0). Electronically Signed   By: San Morelle M.D.   On: 07/21/2018 18:46   US Abdomen Limited Ruq  Result Date: 07/22/2018 CLINICAL DATA:  Heartburn, nausea, vomiting, elevated LFTs EXAM: ULTRASOUND ABDOMEN LIMITED RIGHT UPPER QUADRANT COMPARISON:  07/21/2018 FINDINGS: Gallbladder: Surgically absent Common bile duct: Diameter: 4 mm Liver: Increased hepatic echogenicity compatible with hepatic steatosis. This limits evaluation for a focal hepatic abnormality. No biliary dilatation. Portal vein is patent on color Doppler imaging with normal direction of blood flow towards the liver. IMPRESSION: Remote cholecystectomy No biliary dilatation or obstruction Hepatic steatosis Electronically Signed   By: Jerilynn Mages.  Shick M.D.   On: 07/22/2018 09:14    Scheduled Meds: . aspirin  81 mg Oral Daily  . doxepin  50 mg Oral QHS  . enoxaparin (LOVENOX) injection  40 mg Subcutaneous Q24H  . insulin aspart  0-15 Units Subcutaneous TID WC  . insulin aspart  0-5 Units Subcutaneous QHS  . insulin aspart  4 Units Subcutaneous TID WC  . insulin glargine  20 Units Subcutaneous Daily  . levothyroxine  150 mcg Oral QAC breakfast  . lurasidone  60 mg Oral Q breakfast  . nystatin  5 mL Oral QID  . QUEtiapine  150 mg Oral QHS   Continuous Infusions: . sodium chloride Stopped (07/23/18 1308)  . sodium chloride 75 mL/hr at 07/23/18 1307  . famotidine (PEPCID) IV Stopped (07/23/18 1134)    Principal Problem:    Odynophagia Active Problems:   Chest pain   Essential hypertension   Diabetes mellitus type 2 in obese (HCC)   Mild renal insufficiency   Orthostatic hypotension   Tachycardia   Hypothyroidism   Elevated transaminase level   Leukocytosis   Incidental lung nodule   Extravasation accident, initial encounter   Prolonged QT interval   LFTs abnormal   Hepatic steatosis   Bipolar depression ([place])    Time spent: 55 minutes    [place] NP  [facility]  If 7PM-7AM, please contact night-coverage at [email or web address], password [facility] 07/23/2018, 2:40 PM  LOS: 0 days

## 2018-07-23 NOTE — Progress Notes (Signed)
Received patient on enteric precautions per protocol.CDiff done within the last 30 days and was positive during last admission.Noted order to D/C enteric precautions by the NP.

## 2018-07-23 NOTE — Progress Notes (Signed)
Patient complaining of trouble swallowing, says that when she swallows food it feels like it gets stuck and is causing pain.

## 2018-07-24 ENCOUNTER — Observation Stay (HOSPITAL_COMMUNITY): Payer: 59

## 2018-07-24 ENCOUNTER — Encounter (HOSPITAL_COMMUNITY): Payer: Self-pay | Admitting: Physician Assistant

## 2018-07-24 DIAGNOSIS — E876 Hypokalemia: Secondary | ICD-10-CM | POA: Diagnosis present

## 2018-07-24 DIAGNOSIS — E785 Hyperlipidemia, unspecified: Secondary | ICD-10-CM | POA: Diagnosis present

## 2018-07-24 DIAGNOSIS — E1165 Type 2 diabetes mellitus with hyperglycemia: Secondary | ICD-10-CM | POA: Diagnosis present

## 2018-07-24 DIAGNOSIS — R1310 Dysphagia, unspecified: Secondary | ICD-10-CM

## 2018-07-24 DIAGNOSIS — X58XXXA Exposure to other specified factors, initial encounter: Secondary | ICD-10-CM | POA: Diagnosis not present

## 2018-07-24 DIAGNOSIS — R079 Chest pain, unspecified: Secondary | ICD-10-CM | POA: Diagnosis not present

## 2018-07-24 DIAGNOSIS — I10 Essential (primary) hypertension: Secondary | ICD-10-CM | POA: Diagnosis present

## 2018-07-24 DIAGNOSIS — Z6841 Body Mass Index (BMI) 40.0 and over, adult: Secondary | ICD-10-CM | POA: Diagnosis not present

## 2018-07-24 DIAGNOSIS — E669 Obesity, unspecified: Secondary | ICD-10-CM | POA: Diagnosis not present

## 2018-07-24 DIAGNOSIS — R12 Heartburn: Secondary | ICD-10-CM | POA: Diagnosis present

## 2018-07-24 DIAGNOSIS — R911 Solitary pulmonary nodule: Secondary | ICD-10-CM | POA: Diagnosis present

## 2018-07-24 DIAGNOSIS — K210 Gastro-esophageal reflux disease with esophagitis: Secondary | ICD-10-CM | POA: Diagnosis present

## 2018-07-24 DIAGNOSIS — E559 Vitamin D deficiency, unspecified: Secondary | ICD-10-CM | POA: Diagnosis present

## 2018-07-24 DIAGNOSIS — F3130 Bipolar disorder, current episode depressed, mild or moderate severity, unspecified: Secondary | ICD-10-CM | POA: Diagnosis present

## 2018-07-24 DIAGNOSIS — E039 Hypothyroidism, unspecified: Secondary | ICD-10-CM | POA: Diagnosis present

## 2018-07-24 DIAGNOSIS — K449 Diaphragmatic hernia without obstruction or gangrene: Secondary | ICD-10-CM | POA: Diagnosis present

## 2018-07-24 DIAGNOSIS — K760 Fatty (change of) liver, not elsewhere classified: Secondary | ICD-10-CM | POA: Diagnosis present

## 2018-07-24 DIAGNOSIS — R0789 Other chest pain: Secondary | ICD-10-CM | POA: Diagnosis present

## 2018-07-24 DIAGNOSIS — K224 Dyskinesia of esophagus: Secondary | ICD-10-CM | POA: Diagnosis present

## 2018-07-24 DIAGNOSIS — T80818A Extravasation of other vesicant agent, initial encounter: Secondary | ICD-10-CM | POA: Diagnosis not present

## 2018-07-24 DIAGNOSIS — J42 Unspecified chronic bronchitis: Secondary | ICD-10-CM | POA: Diagnosis present

## 2018-07-24 DIAGNOSIS — G43909 Migraine, unspecified, not intractable, without status migrainosus: Secondary | ICD-10-CM | POA: Diagnosis present

## 2018-07-24 DIAGNOSIS — N179 Acute kidney failure, unspecified: Secondary | ICD-10-CM | POA: Diagnosis present

## 2018-07-24 DIAGNOSIS — F319 Bipolar disorder, unspecified: Secondary | ICD-10-CM | POA: Diagnosis not present

## 2018-07-24 DIAGNOSIS — D563 Thalassemia minor: Secondary | ICD-10-CM | POA: Diagnosis present

## 2018-07-24 DIAGNOSIS — E1169 Type 2 diabetes mellitus with other specified complication: Secondary | ICD-10-CM | POA: Diagnosis not present

## 2018-07-24 DIAGNOSIS — K319 Disease of stomach and duodenum, unspecified: Secondary | ICD-10-CM | POA: Diagnosis present

## 2018-07-24 DIAGNOSIS — E860 Dehydration: Secondary | ICD-10-CM | POA: Diagnosis present

## 2018-07-24 DIAGNOSIS — K209 Esophagitis, unspecified: Secondary | ICD-10-CM | POA: Diagnosis not present

## 2018-07-24 DIAGNOSIS — J45909 Unspecified asthma, uncomplicated: Secondary | ICD-10-CM | POA: Diagnosis present

## 2018-07-24 LAB — CBC
HCT: 34.5 % — ABNORMAL LOW (ref 36.0–46.0)
Hemoglobin: 10.3 g/dL — ABNORMAL LOW (ref 12.0–15.0)
MCH: 19.7 pg — ABNORMAL LOW (ref 26.0–34.0)
MCHC: 29.9 g/dL — AB (ref 30.0–36.0)
MCV: 66 fL — ABNORMAL LOW (ref 80.0–100.0)
Platelets: 264 10*3/uL (ref 150–400)
RBC: 5.23 MIL/uL — ABNORMAL HIGH (ref 3.87–5.11)
RDW: 15.7 % — ABNORMAL HIGH (ref 11.5–15.5)
WBC: 7.6 10*3/uL (ref 4.0–10.5)
nRBC: 0 % (ref 0.0–0.2)

## 2018-07-24 LAB — BASIC METABOLIC PANEL
Anion gap: 7 (ref 5–15)
BUN: 8 mg/dL (ref 6–20)
CO2: 23 mmol/L (ref 22–32)
Calcium: 8 mg/dL — ABNORMAL LOW (ref 8.9–10.3)
Chloride: 106 mmol/L (ref 98–111)
Creatinine, Ser: 0.76 mg/dL (ref 0.44–1.00)
GFR calc Af Amer: >60 mL/min (ref >60)
GFR calc non Af Amer: >60 mL/min (ref >60)
Glucose, Bld: 271 mg/dL — ABNORMAL HIGH (ref 70–99)
Potassium: 3.4 mmol/L — ABNORMAL LOW (ref 3.5–5.1)
Sodium: 136 mmol/L (ref 135–145)

## 2018-07-24 LAB — GLUCOSE, CAPILLARY
GLUCOSE-CAPILLARY: 191 mg/dL — AB (ref 70–99)
Glucose-Capillary: 274 mg/dL — ABNORMAL HIGH (ref 70–99)
Glucose-Capillary: 228 mg/dL — ABNORMAL HIGH (ref 70–99)
Glucose-Capillary: 327 mg/dL — ABNORMAL HIGH (ref 70–99)
Glucose-Capillary: 267 mg/dL — ABNORMAL HIGH (ref 70–99)

## 2018-07-24 MED ORDER — POTASSIUM CHLORIDE 20 MEQ/15ML (10%) PO SOLN
40.0000 meq | Freq: Once | ORAL | Status: AC
  Administered 2018-07-24: 40 meq via ORAL
  Filled 2018-07-24: qty 30

## 2018-07-24 MED ORDER — FLUCONAZOLE 100MG IVPB
100.0000 mg | INTRAVENOUS | Status: DC
  Administered 2018-07-24 (×2): 100 mg via INTRAVENOUS
  Filled 2018-07-24 (×4): qty 50

## 2018-07-24 MED ORDER — LOSARTAN POTASSIUM 50 MG PO TABS
100.0000 mg | ORAL_TABLET | Freq: Every day | ORAL | Status: DC
  Administered 2018-07-24 – 2018-07-26 (×3): 100 mg via ORAL
  Filled 2018-07-24 (×3): qty 2

## 2018-07-24 MED ORDER — FAMOTIDINE 20 MG PO TABS
20.0000 mg | ORAL_TABLET | Freq: Two times a day (BID) | ORAL | Status: DC
  Administered 2018-07-24 – 2018-07-26 (×3): 20 mg via ORAL
  Filled 2018-07-24 (×4): qty 1

## 2018-07-24 NOTE — Progress Notes (Signed)
Inpatient Diabetes Program Recommendations  AACE/ADA: New Consensus Statement on Inpatient Glycemic Control (2019)  Target Ranges:  Prepandial:   less than 140 mg/dL      Peak postprandial:   less than 180 mg/dL (1-2 hours)      Critically ill patients:  140 - 180 mg/dL   Results for ALAYA, IVERSON (MRN [id number]) as of 07/24/2018 08:18  Ref. Range 07/24/2018 06:41  Glucose Latest Ref Range: 70 - 99 mg/dL 271 (H)   Results for LAURNA, SHETLEY (MRN [id number]) as of 07/24/2018 08:18  Ref. Range 07/23/2018 07:39 07/23/2018 12:41 07/23/2018 17:12 07/23/2018 21:29  Glucose-Capillary Latest Ref Range: 70 - 99 mg/dL 266 (H) 266 (H) 286 (H) 274 (H)    Review of Glycemic Control   Current orders for Inpatient glycemic control: Lantus 20 units daily, Novolog 6 units TID with meals, Novolog 0-15 units TID with meals, Novolog 0-5 units QHS  Inpatient Diabetes Program Recommendations:  Insulin - Basal: Please consider increasing Lantus to 40 units daily. Insulin - Meal Coverage: Please consider increasing meal coverage to Novolog 8 units TID with meals.   Thanks, Barnie Alderman, RN, MSN, CDE Diabetes Coordinator Inpatient Diabetes Program [phone] (Team Pager from 8am to 5pm)

## 2018-07-24 NOTE — Progress Notes (Signed)
PROGRESS NOTE        PATIENT DETAILS Name: Morgan [name] Age: 52 y.o. Sex: female Date of Birth: 03-26-1966 Admit Date: 07/21/2018 Admitting Physician Vianne Bulls, MD JKD:TOIZTI, Mikey Bussing., MD  Brief Narrative: Patient is a 52 y.o. female with history of hypertension, bipolar disorder, diabetes-recent history of C. difficile colitis approximately 1 month back-presenting with 2-3 history of odynophagia/dysphagia.  A few days back she had nausea/vomiting.  See below for further details  Subjective: No nausea or vomiting-but continues to have severe dysphagia to both liquids and solids-preventing reliable oral intake.  Assessment/Plan: Odynophagia/dysphagia: To both solids and liquids-barium esophagogram did not show any stricture or mass.  Due to acute onset of symptoms-have consulted GI for endoscopic evaluation.  We will start empiric IV Diflucan.  Continue IV fluids as patient's oral intake is unreliable due to severity of her symptoms.  Chest pain: Likely noncardiac-history very suggestive of GI etiology.  Troponin/EKG negative, CTA chest negative for PE.  Doubt further work-up required.  Leukocytosis: No infection apparent on UA/chest x-ray-suspect this could be from esophagitis.  WBC downtrending.  Continue supportive care.  AKI: Resolved.  Likely hemodynamically mediated due to poor oral intake and nausea/vomiting.  Hypokalemia: Replete and recheck.  Recheck magnesium tomorrow.  Insulin-dependent DM-2: CBGs on the higher side-her diet is very erratic at this point-suspect best to tolerate some mild hypoglycemia at this point-once GI evaluation is completed-and her diet is more reliable-we can then become more aggressive with CBG management.  Continue Lantus 20 units daily, 6 units of NovoLog with meals and SSI.  Hypertension: Blood pressure fluctuating-reasonable to resume Cozaar and follow.  Hypothyroidism: Continue Synthroid  Incidental  RLL nodule seen on CT chest: Follow-up CT in 6 to 12 months as an outpatient.  Bipolar disorder: Stable-continue doxepin, Seroquel, Latuda and Prozac.  QTC within normal limits on 12/22.  Debility/deconditioning: Suspect worsening debility/deconditioning due to acute illness-PT evaluation completed-recommendations are for SNF.  Will consult social worker.  DVT Prophylaxis: Prophylactic Lovenox   Code Status: Full code   Family Communication: None at bedside  Disposition Plan: Remain inpatient  Antimicrobial agents: Anti-infectives (From admission, onward)   None      Procedures: None  CONSULTS:  GI  Time spent: 25- minutes-Greater than 50% of this time was spent in counseling, explanation of diagnosis, planning of further management, and coordination of care.  MEDICATIONS: Scheduled Meds: . aspirin  81 mg Oral Daily  . doxepin  50 mg Oral QHS  . enoxaparin (LOVENOX) injection  40 mg Subcutaneous Q24H  . famotidine  20 mg Oral BID  . insulin aspart  0-15 Units Subcutaneous TID WC  . insulin aspart  0-5 Units Subcutaneous QHS  . insulin aspart  6 Units Subcutaneous TID WC  . insulin glargine  20 Units Subcutaneous Daily  . levothyroxine  150 mcg Oral QAC breakfast  . lurasidone  60 mg Oral Q breakfast  . nystatin  5 mL Oral QID  . QUEtiapine  150 mg Oral QHS   Continuous Infusions: . sodium chloride Stopped (07/23/18 1308)  . sodium chloride 75 mL/hr at 07/24/18 0600   PRN Meds:.acetaminophen, alum & mag hydroxide-simeth, nitroGLYCERIN   PHYSICAL EXAM: Vital signs: Vitals:   07/24/18 0133 07/24/18 0552 07/24/18 0559 07/24/18 1207  BP:  (!) 173/91 123/63 (!) 159/87  Pulse:  86 83  81  Resp:  18  16  Temp:  98.2 F (36.8 C)  98.3 F (36.8 C)  TempSrc:  Oral  Oral  SpO2:  93%  99%  Weight: 109.1 kg     Height:       Filed Weights   07/22/18 0548 07/23/18 0619 07/24/18 0133  Weight: 104.4 kg 107.2 kg 109.1 kg   Body mass index is 44.01 kg/m.    General appearance :Awake, alert, not in any distress.  Eyes:Pink conjunctiva HEENT: Atraumatic and Normocephalic Neck: supple Resp:Good air entry bilaterally, no added sounds  CVS: S1 S2 regular, no murmurs.  GI: Bowel sounds present, very mildly tender in epigastric area-no peritoneal signs.  Nondistended.  Extremities: B/L Lower Ext shows no edema, both legs are warm to touch Neurology:  speech clear,Non focal-but with some mild generalized weakness. Musculoskeletal:No digital cyanosis Skin:No Rash, warm and dry Wounds:N/A  I have personally reviewed following labs and imaging studies  LABORATORY DATA: CBC: Recent Labs  Lab 07/21/18 1333 07/22/18 0428 07/23/18 0557 07/24/18 0641  WBC 19.7* 13.2* 7.7 7.6  NEUTROABS  --  6.9  --   --   HGB 13.9 12.2 10.8* 10.3*  HCT 45.3 40.0 35.5* 34.5*  MCV 65.5* 65.5* 66.0* 66.0*  PLT 423* 340 263 240    Basic Metabolic Panel: Recent Labs  Lab 07/21/18 1333 07/22/18 0428 07/23/18 0557 07/24/18 0641  NA 131* 135 136 136  K 3.7 3.6 3.6 3.4*  CL 91* 100 104 106  CO2 24 23 20* 23  GLUCOSE 405* 269* 283* 271*  BUN 29* 28* 15 8  CREATININE 1.18* 1.09* 0.96 0.76  CALCIUM 8.9 8.3* 8.1* 8.0*  MG  --  2.7*  --   --     GFR: Estimated Creatinine Clearance: 93.5 mL/min (by C-G formula based on SCr of 0.76 mg/dL).  Liver Function Tests: Recent Labs  Lab 07/21/18 1333 07/22/18 0428  AST 95* 92*  ALT 95* 88*  ALKPHOS 76 65  BILITOT 1.6* 1.6*  PROT 6.8 5.9*  ALBUMIN 3.6 3.1*   No results for input(s): LIPASE, AMYLASE in the last 168 hours. No results for input(s): AMMONIA in the last 168 hours.  Coagulation Profile: No results for input(s): INR, PROTIME in the last 168 hours.  Cardiac Enzymes: Recent Labs  Lab 07/21/18 2131 07/22/18 0236  TROPONINI <0.03 <0.03    BNP (last 3 results) No results for input(s): PROBNP in the last 8760 hours.  HbA1C: No results for input(s): HGBA1C in the last 72  hours.  CBG: Recent Labs  Lab 07/23/18 0739 07/23/18 1241 07/23/18 1712 07/23/18 2129 07/24/18 0850  GLUCAP 266* 266* 286* 274* 228*    Lipid Profile: No results for input(s): CHOL, HDL, LDLCALC, TRIG, CHOLHDL, LDLDIRECT in the last 72 hours.  Thyroid Function Tests: No results for input(s): TSH, T4TOTAL, FREET4, T3FREE, THYROIDAB in the last 72 hours.  Anemia Panel: No results for input(s): VITAMINB12, FOLATE, FERRITIN, TIBC, IRON, RETICCTPCT in the last 72 hours.  Urine analysis:    Component Value Date/Time   COLORURINE YELLOW 07/21/2018 1904   APPEARANCEUR HAZY (A) 07/21/2018 1904   LABSPEC 1.039 (H) 07/21/2018 1904   PHURINE 6.0 07/21/2018 1904   GLUCOSEU 150 (A) 07/21/2018 1904   HGBUR NEGATIVE 07/21/2018 1904   BILIRUBINUR NEGATIVE 07/21/2018 1904   KETONESUR 5 (A) 07/21/2018 1904   PROTEINUR NEGATIVE 07/21/2018 1904   UROBILINOGEN 0.2 08/03/2013 2352   NITRITE NEGATIVE 07/21/2018 1904   LEUKOCYTESUR NEGATIVE 07/21/2018 1904  Sepsis Labs: Lactic Acid, Venous    Component Value Date/Time   LATICACIDVEN 2.7 ([place]) 06/27/2018 1444    MICROBIOLOGY: Recent Results (from the past 240 hour(s))  Urine culture     Status: None   Collection Time: 07/21/18  7:04 PM  Result Value Ref Range Status   Specimen Description URINE, RANDOM  Final   Special Requests   Final    NONE Performed at [facility], [place] [address], [place], [place] [address]    Culture   Final    Multiple bacterial morphotypes present, none predominant. Suggest appropriate recollection if clinically indicated.   Report Status 07/22/2018 FINAL  Final    RADIOLOGY STUDIES/RESULTS: Dg Chest 2 View  Result Date: 07/21/2018 CLINICAL DATA:  Heartburn EXAM: CHEST - 2 VIEW COMPARISON:  06/27/2018 FINDINGS: The heart size and mediastinal contours are within normal limits. Both lungs are clear. Stent cephalad to the aortic arch IMPRESSION: No active cardiopulmonary disease. Electronically  Signed   By: Donavan Foil M.D.   On: 07/21/2018 15:57   Ct Angio Chest Pe W And/or Wo Contrast  Result Date: 07/21/2018 CLINICAL DATA:  Chest pain.  PE suspected. EXAM: CT ANGIOGRAPHY CHEST WITH CONTRAST TECHNIQUE: Multidetector CT imaging of the chest was performed using the standard protocol during bolus administration of intravenous contrast. Multiplanar CT image reconstructions and MIPs were obtained to evaluate the vascular anatomy. There was extravasation of contrast at the end of the injection. Opacification of pulmonary arteries is diagnostic. Exam the patient. The area of the extravasation in the left forearm was blue. Equal compress was being applied. The area measured 3 x 8 cm. I outlined the area on the patient's skin. Distal sensation and blood flow was intact. CONTRAST:  40mL ISOVUE-370 IOPAMIDOL (ISOVUE-370) INJECTION 76% COMPARISON:  Two-view chest x-ray 07/21/2018 FINDINGS: Cardiovascular: The heart size is normal. Coronary artery calcifications are present. Atherosclerotic changes are noted in the aorta and at the origins the great vessels. A stent is in place at the left subclavian artery. Pulmonary artery opacification is satisfactory. No focal filling defects are present to suggest pulmonary embolus. Mediastinum/Nodes: No significant mediastinal, hilar, or axillary adenopathy is present. Esophagus is normal. Thoracic inlet is within normal limits. Lungs/Pleura: Scattered ground-glass attenuation is present bilaterally. A 7 x 4 x 4 mm pulmonary nodule is noted along the major fissure in the right lower lobe. No other focal nodule or mass lesion is present. There is no significant pleural effusion. Upper Abdomen: Upper abdomen is remarkable for cholecystectomy. No solid organ lesions are present. Musculoskeletal: Vertebral body heights are maintained. A hemangioma is present at T2. Marrow density is otherwise normal. Sternum is intact. Ribs are within normal limits. Review of the MIP images  confirms the above findings. IMPRESSION: 1. No pulmonary embolus. 2. Contrast extravasation into the left upper extremity. Recommend core compress and elevation. Recommend continued surveillance. Patient is at low risk for complication. 3. Right lower lobe pulmonary nodule associated with the minor fissure. Me in size of the nodule is 5.5 mm. Non-contrast chest CT at 6-12 months is recommended. If the nodule is stable at time of repeat CT, then future CT at 18-24 months (from today's scan) is considered optional for low-risk patients, but is recommended for high-risk patients. This recommendation follows the consensus statement: Guidelines for Management of Incidental Pulmonary Nodules Detected on CT Images: From the Fleischner Society 2017; Radiology 2017; 284:228-243. 4. Left subclavian artery stent. 5.  Aortic Atherosclerosis (ICD10-I70.0). Electronically Signed   By:  San Morelle M.D.   On: 07/21/2018 18:46   Dg Esophagus  Result Date: 07/24/2018 CLINICAL DATA:  Odynophagia. Feels like food and pills get stuck in throat. EXAM: ESOPHOGRAM/BARIUM SWALLOW TECHNIQUE: Single contrast examination was performed using thin barium and barium tablet. FLUOROSCOPY TIME:  Fluoroscopy Time:  1 minutes 6 second Radiation Exposure Index (if provided by the fluoroscopic device): Number of Acquired Spot Images: 0 COMPARISON:  None. FINDINGS: Examination is limited due to limited mobility the patient. The study was performed in the left posterior oblique position with the patient head elevated approximately 20 degrees. Pharyngeal swallowing is negative. No aspiration. No stricture in the pharynx or esophagus. Mild to moderate esophageal dysmotility. Mild dilatation esophagus with decreased motility. No esophageal stricture or mass Negative for hiatal hernia Barium tablet initially with slow to pass the vallecula. Barium tablet then passed readily into the distal esophagus where it stayed for several minutes. No  stricture is identified. This could be due to poor esophageal motility. IMPRESSION: Mild to moderate esophageal dysmotility. No stricture or mass. No hiatal hernia Barium tablet was slow to pass through the pharynx and also did not pass into the stomach. This is likely due to dysmotility as well as semi supine positioning. Electronically Signed   By: Franchot Gallo M.D.   On: 07/24/2018 09:22   Dg Chest Port 1 View  Result Date: 06/27/2018 CLINICAL DATA:  Fever EXAM: PORTABLE CHEST 1 VIEW COMPARISON:  10/10/2017 FINDINGS: The heart size and mediastinal contours are within normal limits. Both lungs are clear. The visualized skeletal structures are unremarkable. IMPRESSION: No active disease. Electronically Signed   By: Ulyses Jarred M.D.   On: 06/27/2018 05:17   US Abdomen Limited Ruq  Result Date: 07/22/2018 CLINICAL DATA:  Heartburn, nausea, vomiting, elevated LFTs EXAM: ULTRASOUND ABDOMEN LIMITED RIGHT UPPER QUADRANT COMPARISON:  07/21/2018 FINDINGS: Gallbladder: Surgically absent Common bile duct: Diameter: 4 mm Liver: Increased hepatic echogenicity compatible with hepatic steatosis. This limits evaluation for a focal hepatic abnormality. No biliary dilatation. Portal vein is patent on color Doppler imaging with normal direction of blood flow towards the liver. IMPRESSION: Remote cholecystectomy No biliary dilatation or obstruction Hepatic steatosis Electronically Signed   By: Jerilynn Mages.  Shick M.D.   On: 07/22/2018 09:14     LOS: 0 days   Oren Binet, MD  [facility]  If 7PM-7AM, please contact night-coverage  Please page via [email or web address]-click on MD name and type text message  07/24/2018, 12:44 PM

## 2018-07-24 NOTE — Consult Note (Signed)
[facility] Consult: 10:57 AM 07/24/2018  LOS: 0 days    Referring Provider: Dr Sloan Leiter.    Primary Care Physician:  Bradly Bienenstock., MD Primary Gastroenterologist:  Althia Forts.   Dr. Rickard Rhymes at [facility].    Reason for Consultation: Dysphagia, odynophagia.   HPI: Morgan [name] is a 52 y.o. female.  PMH IDDM, type 2.  Obesity. CVA, most recent 2015.  Thalassemia minor.  Hiatal hernia,GERD.  Hepatic steatosis.  Bipolar depression.  Hypertension.  Hyperlipidemia.  Cholecystectomy 2010.  Abdominal hysterectomy 2015. 01/2014 EGD showing minor reflux esophagitis with small linear erosion at the GE junction.  01/2014 average risk screening colonoscopy normal.  Repeat study due 01/2024.   Hospital admission 11/26 -07/02/2018 with sepsis, AKI, acute encephalopathy, uncontrolled IDDM.  C. difficile + diarrhea.  Completed outpatient oral vancomycin, diarrhea resolved.  Beginning Tuesday evening through late evening Wednesday, she had nonbloody, bilious nausea vomiting "every hour".  As she began to take liquids, starting with water, she noticed a sense of an air bubble in her upper esophageal region and pain in her esophagus with swallowing.  Eventually stuff would go down but it hurt and she had to eat small amounts.  Symptoms have improved somewhat and she is able to tolerate soft diet in small quantities.  She has long history of GERD but this is well controlled with 40 mg Protonix daily.  Normally has no issues with swallowing.  About 5 times a week she takes 2 Aleve/day.  Also on low-dose aspirin  Patient was admitted 12/28 with the esophageal pain, lethargy.  Diarrhea had resolved several days back. Mild hypokalemia, K 3.4.  Blood glucose 283. T bili 1.6, alkaline phosphatase 65,  AST/ALT 92/88. Hgb 12.2 >> 10.3.  Was 10.6 - 13.9 during recent hospitalization. MCV consistently low ranging 65 -T6. 2 days ago mild AKI, resolved today. Troponin I is negative x 2.  07/21/2018 CT angio chest negative for PE.  There was contrast extravasation into the left upper extremity.  5.5 mm right lower lobe pulmonary nodule will need follow-up CT surveillance.  Left subclavian artery stent.  Aortic atherosclerosis. 12/20/2017 abdominal ultrasound: Hepatic steatosis.  4 mm CBD.  Surgically absent gallbladder. Barium esophagram: Mild to moderate esophageal dysmotility.  No stricture, no masses, no HH.  Barium tablet slow to pass through pharynx and did not pass into the stomach likely due to dysmotility as well as her semi-supine positioning.  In terms of patient's microcytosis and history of thalassemia, MD has prescribed and she continues to take multivitamin with iron, has never received iron infusions.    Past Medical History:  Diagnosis Date  . Anxiety   . Asthma   . Bipolar depression ([place])   . Chronic bronchitis ([place])   . Chronic esophagogastric ulcer   . Chronic stomach ulcer   . Depression   . Fibroid   . GERD (gastroesophageal reflux disease)   . Heart murmur   . Hepatic steatosis   . History of blood transfusion 2010   "related to subclavian stent"  .  History of hiatal hernia   . Hyperlipidemia   . Hypertension   . Hypothyroid   . Migraine    "cerebral migraines; 1-2/month" (06/29/2018)  . 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 Stroke [facility])    "I've had 2; most recent one was ~ 2015 or before; affected balance" (06/29/2018)  . Thalassemia minor   . Type II diabetes mellitus ([place])   . Vitamin D deficiency     Past Surgical History:  Procedure Laterality Date  . ABDOMINAL HYSTERECTOMY  2015   "still have 1 ovary"  . CESAREAN SECTION  1988; 1996  . KNEE RECONSTRUCTION Left 1980  . LAPAROSCOPIC CHOLECYSTECTOMY    . SUBCLAVIAN STENT PLACEMENT Left 2010    . TONSILLECTOMY  1970    Prior to Admission medications   Medication Sig Start Date End Date Taking? Authorizing Provider  aspirin 81 MG tablet Take 81 mg by mouth daily.    Yes [provider]  Cholecalciferol (VITAMIN D3) 50 MCG (2000 UT) TABS Take 2,000-4,000 Units by mouth See admin instructions. Take 4,000 units by mouth once a day on Mon/Wed/Fri and 2,000 units on Sun/Tues/Thurs/Sat   Yes [provider]  Co-Enzyme Q-10 30 MG CAPS Take 30 mg by mouth daily.    Yes [provider]  doxepin (SINEQUAN) 25 MG capsule Take 50 mg by mouth at bedtime.    Yes [provider]  EPINEPHrine 0.3 mg/0.3 mL IJ SOAJ injection Inject 0.3 mg into the muscle once as needed (for an anaphylactic reaction).  02/08/18  Yes [provider]  FLUoxetine (PROZAC) 20 MG tablet Take 60 mg by mouth every morning.    Yes [provider]  GLUCAGON EMERGENCY 1 MG injection Inject 1 mg into the vein once as needed (FOR ONSET OF HYPOGLYCEMIA).  02/09/18  Yes [provider]  Insulin Human (INSULIN PUMP) SOLN Inject into the skin continuous. Humalog   Yes [provider]  levothyroxine (SYNTHROID, LEVOTHROID) 150 MCG tablet Take 150 mcg by mouth daily.    Yes [provider]  losartan (COZAAR) 100 MG tablet Take 100 mg by mouth daily.   Yes [provider]  Lurasidone HCl (LATUDA) 60 MG TABS Take 60 mg by mouth daily.   Yes [provider]  Multiple Vitamin (MULTIVITAMIN WITH MINERALS) TABS tablet Take 1 tablet by mouth daily.   Yes [provider]  naproxen sodium (ALEVE) 220 MG tablet Take 440 mg by mouth 2 (two) times daily as needed (for pain).    Yes [provider]  pantoprazole (PROTONIX) 20 MG tablet Take 40 mg by mouth every morning.    Yes [provider]  QUEtiapine (SEROQUEL) 50 MG tablet Take 150 mg by mouth at bedtime.   Yes [provider]  Semaglutide (OZEMPIC, 0.25 OR 0.5  MG/DOSE, [place]) Inject 0.5 mg into the skin every Monday.    Yes [provider]  Blood Glucose Monitoring Suppl (CONTOUR NEXT ONE) KIT 1 kit by Does not apply route as directed. 07/02/18   Patrecia Pour, MD    Scheduled Meds: . aspirin  81 mg Oral Daily  . doxepin  50 mg Oral QHS  . enoxaparin (LOVENOX) injection  40 mg Subcutaneous Q24H  . famotidine  20 mg Oral BID  . insulin aspart  0-15 Units Subcutaneous TID WC  . insulin aspart  0-5 Units Subcutaneous QHS  . insulin aspart  6 Units Subcutaneous TID WC  . insulin glargine  20  Units Subcutaneous Daily  . levothyroxine  150 mcg Oral QAC breakfast  . lurasidone  60 mg Oral Q breakfast  . nystatin  5 mL Oral QID  . QUEtiapine  150 mg Oral QHS   Infusions: . sodium chloride Stopped (07/23/18 1308)  . sodium chloride 75 mL/hr at 07/24/18 0600   PRN Meds: acetaminophen, alum & mag hydroxide-simeth, nitroGLYCERIN   Allergies as of 07/21/2018 - Review Complete 07/21/2018  Allergen Reaction Noted  . Ilosone [erythromycin] Anaphylaxis 08/02/2013  . Keflex [cephalexin] Anaphylaxis 08/02/2013  . Penicillins Anaphylaxis 08/02/2013  . Iron Other (See Comments) 08/02/2013  . Metformin and related Other (See Comments) 08/02/2013  . Morphine and related Other (See Comments) 08/02/2013    Family History  Problem Relation Age of Onset  . Diabetes Mother   . Hypertension Mother   . Hyperlipidemia Mother   . CAD Mother   . CAD Father   . Heart failure Father   . Hypertension Sister   . Hyperlipidemia Sister   . Hypertension Brother   . Hyperlipidemia Brother     Social History   Socioeconomic History  . Marital status: Divorced    Spouse name: Not on file  . Number of children: Not on file  . Years of education: Not on file  . Highest education level: Not on file  Occupational History  . Occupation: Electrical engineer support  Social Needs  . Financial resource strain: Not on file  . Food insecurity:    Worry: Not  on file    Inability: Not on file  . Transportation needs:    Medical: Not on file    Non-medical: Not on file  Tobacco Use  . Smoking status: Former Smoker    Packs/day: 2.00    Years: 36.00    Pack years: 72.00    Types: Cigarettes    Last attempt to quit: 05/14/2014    Years since quitting: 4.1  . Smokeless tobacco: Never Used  Substance and Sexual Activity  . Alcohol use: Not Currently  . Drug use: Not Currently  . Sexual activity: Not Currently  Lifestyle  . Physical activity:    Days per week: Not on file    Minutes per session: Not on file  . Stress: Not on file  Relationships  . Social connections:    Talks on phone: Not on file    Gets together: Not on file    Attends religious service: Not on file    Active member of club or organization: Not on file    Attends meetings of clubs or organizations: Not on file    Relationship status: Not on file  . Intimate partner violence:    Fear of current or ex partner: Not on file    Emotionally abused: Not on file    Physically abused: Not on file    Forced sexual activity: Not on file  Other Topics Concern  . Not on file  Social History Narrative  . Not on file    REVIEW OF SYSTEMS: Constitutional: Patient quite inactive.  If she walks to her mailbox which is not very far, she gets out of breath.  Her conditioning has worsened with recent hospitalizations so it is a challenge for her to walk more than about 20 feet. ENT:  No nose bleeds Pulm: Dyspnea on exertion, worse than usual.  No cough no dyspnea at rest. CV:  No palpitations, no LE edema.  No chest pain. GU:  No hematuria, no frequency. GI:  Per HPI Heme: Denies excessive or unusual bleeding/bruising. Transfusions: In 2010 related to subclavian stent. Neuro:  No headaches, no peripheral tingling or numbness Derm:  No itching, no rash or sores.  Endocrine:  No sweats or chills.  No polyuria or dysuria Immunization: Current on her flu shot. Travel:  None  beyond local counties in last few months.    PHYSICAL EXAM: Vital signs in last 24 hours: Vitals:   07/24/18 0552 07/24/18 0559  BP: (!) 173/91 123/63  Pulse: 86 83  Resp: 18   Temp: 98.2 F (36.8 C)   SpO2: 93%    Wt Readings from Last 3 Encounters:  07/24/18 109.1 kg  06/30/18 113.8 kg  01/05/18 97.5 kg    General: Patient looks chronically ill, obese.  No acute distress.  Alert in bed, comfortable. Head: Facial asymmetry or swelling.  No signs of head trauma. Eyes: No scleral icterus.  Slight conjunctival pallor.  EOMI. Ears: Not hard of hearing Nose: No discharge or congestion. Mouth: Oral mucosa slightly dry, pink, clear.  Tongue midline.  Poor dentition. Neck: No JVD, no masses, no thyromegaly. Lungs: Clear bilaterally, no labored breathing or cough. Heart: RRR.  No MRG.  S1, S2 present. Abdomen: Soft.  Not tender or distended.  No HSM, masses, bruits, hernias.  Active bowel sounds..   Rectal: Did not perform. Musc/Skeltl: No gross joint deformities, redness or swelling. Extremities: Lower extremity edema, nonpitting. Neurologic: Alert.  Oriented x3.  No tremors, no limb weakness. Skin: No jaundice, no rashes, no sores.  A few hematomas consistent with Lovenox and insulin injection in the lower abdomen. Nodes: No cervical or inguinal adenopathy. Psych: Operative, pleasant, calm.  Intake/Output from previous day: 12/22 0701 - 12/23 0700 In: 2467.4 [P.O.:960; I.V.:1407.4; IV Piggyback:100] Out: -  Intake/Output this shift: Total I/O In: 540.1 [P.O.:240; I.V.:300.1] Out: -   LAB RESULTS: Recent Labs    07/22/18 0428 07/23/18 0557 07/24/18 0641  WBC 13.2* 7.7 7.6  HGB 12.2 10.8* 10.3*  HCT 40.0 35.5* 34.5*  PLT 340 263 264   BMET Lab Results  Component Value Date   NA 136 07/24/2018   NA 136 07/23/2018   NA 135 07/22/2018   K 3.4 (L) 07/24/2018   K 3.6 07/23/2018   K 3.6 07/22/2018   CL 106 07/24/2018   CL 104 07/23/2018   CL 100 07/22/2018    CO2 23 07/24/2018   CO2 20 (L) 07/23/2018   CO2 23 07/22/2018   GLUCOSE 271 (H) 07/24/2018   GLUCOSE 283 (H) 07/23/2018   GLUCOSE 269 (H) 07/22/2018   BUN 8 07/24/2018   BUN 15 07/23/2018   BUN 28 (H) 07/22/2018   CREATININE 0.76 07/24/2018   CREATININE 0.96 07/23/2018   CREATININE 1.09 (H) 07/22/2018   CALCIUM 8.0 (L) 07/24/2018   CALCIUM 8.1 (L) 07/23/2018   CALCIUM 8.3 (L) 07/22/2018   LFT Recent Labs    07/21/18 1333 07/22/18 0428  PROT 6.8 5.9*  ALBUMIN 3.6 3.1*  AST 95* 92*  ALT 95* 88*  ALKPHOS 76 65  BILITOT 1.6* 1.6*   PT/INR No results found for: INR, PROTIME Hepatitis Panel No results for input(s): HEPBSAG, HCVAB, HEPAIGM, HEPBIGM in the last 72 hours. C-Diff No components found for: CDIFF Lipase     Component Value Date/Time   LIPASE 23 04/30/2018 1339    Drugs of Abuse     Component Value Date/Time   LABOPIA NONE DETECTED 06/27/2018 [place] DETECTED 06/27/2018 0455  LABBENZ NONE DETECTED 06/27/2018 0455   AMPHETMU NONE DETECTED 06/27/2018 0455   THCU NONE DETECTED 06/27/2018 0455   LABBARB NONE DETECTED 06/27/2018 0455     RADIOLOGY STUDIES: Dg Esophagus  Result Date: 07/24/2018 CLINICAL DATA:  Odynophagia. Feels like food and pills get stuck in throat. EXAM: ESOPHOGRAM/BARIUM SWALLOW TECHNIQUE: Single contrast examination was performed using thin barium and barium tablet. FLUOROSCOPY TIME:  Fluoroscopy Time:  1 minutes 6 second Radiation Exposure Index (if provided by the fluoroscopic device): Number of Acquired Spot Images: 0 COMPARISON:  None. FINDINGS: Examination is limited due to limited mobility the patient. The study was performed in the left posterior oblique position with the patient head elevated approximately 20 degrees. Pharyngeal swallowing is negative. No aspiration. No stricture in the pharynx or esophagus. Mild to moderate esophageal dysmotility. Mild dilatation esophagus with decreased motility. No esophageal  stricture or mass Negative for hiatal hernia Barium tablet initially with slow to pass the vallecula. Barium tablet then passed readily into the distal esophagus where it stayed for several minutes. No stricture is identified. This could be due to poor esophageal motility. IMPRESSION: Mild to moderate esophageal dysmotility. No stricture or mass. No hiatal hernia Barium tablet was slow to pass through the pharynx and also did not pass into the stomach. This is likely due to dysmotility as well as semi supine positioning. Electronically Signed   By: Franchot Gallo M.D.   On: 07/24/2018 09:22     IMPRESSION:   *   Acute odyno/dysphagia following 24 to 36 hours of frequent nonbloody nausea and vomiting.  Diagnosis of GERD which is normally very well controlled with daily pantoprazole.  Esophageal dysmotility instructed on barium esophagram. EGD in 2015 confirming mild esophagitis. Suspect exacerbation of esophagitis given recent, possibly viral, nausea vomiting.  Possible Candidial esophagitis given poorly controlled DM.    *    C. difficile infection, diarrhea resolved after course of oral vancomycin.  *    Microcytosis with chronic anemia.  History thalassemia, takes oral iron at home.  *    Normal average risk screening colonoscopy in 2015.  Not due for repeat study until 2025    PLAN:     *  EGD tomorrow.  Time TBD.  NPO after midnight.   Check iron studies.  May benefit from [facility].    Azucena Freed  07/24/2018, 10:57 AM Phone [phone]

## 2018-07-24 NOTE — Evaluation (Addendum)
Physical Therapy Evaluation Patient Details Name: Morgan [name] MRN: [id number] DOB: October 27, 1965 Today's Date: 07/24/2018   History of Present Illness  Pt is a 52 y.o. female with recent admission with sepsis, AKI, acute encephalopathy, uncontrolled IDDM  (06/27/18-07/02/18), now admitted 07/21/18 with odynophagia/chest discomfort. Ruled out for PE. Query esophagitis vs yeast as complains of painful swallowing and diabetes poorly controlled. having difficulty taking in fluids and food. PMH includes HTN, uncontrolled DM, bipolar disorder, asthma, anxiety.    Clinical Impression  Pt presents with an overall decrease in functional mobility secondary to above. PTA, pt indep with intermittent use of SPC; lives with son who is unable to provide consistent support. Today, pt requires min guard to minA to ambulate short distance with RW. Pt with BLE weakness, poor balance strategies/postural reactions, fear of falling and h/o peripheral neuropathy putting her at significant risk for falls. Pt would benefit from continued acute PT services to maximize functional mobility and independence prior to d/c with SNF-level therapies.     Follow Up Recommendations SNF;Supervision for mobility/OOB    Equipment Recommendations  Rolling walker with 5" wheels    Recommendations for Other Services       Precautions / Restrictions Precautions Precautions: Fall Restrictions Weight Bearing Restrictions: No      Mobility  Bed Mobility Overal bed mobility: Independent                Transfers Overall transfer level: Needs assistance Equipment used: Rolling walker (2 wheeled) Transfers: Sit to/from Stand Sit to Stand: Supervision         General transfer comment: Increased time and effort; heavy reliance on BUE support pushing up on RW to standing (difficulty when attempting to push from bed); no physical assist required  Ambulation/Gait Ambulation/Gait assistance: Min guard;Min assist Gait  Distance (Feet): 40 Feet Assistive device: Rolling walker (2 wheeled) Gait Pattern/deviations: Step-to pattern;Step-through pattern;Decreased dorsiflexion - right;Decreased dorsiflexion - left;Trunk flexed;Antalgic Gait velocity: Decreased Gait velocity interpretation: <1.31 ft/sec, indicative of household ambulator General Gait Details: Slow, unsteady ambulation with RW and close min guard for balance, intermittent minA due to knee buckling; required 1x seated rest break secondary to leg fatigue/weakness. Pt reports "legs feels like spaghetti" with heavy reliance on BUE support, also fear of falling noted. Pt spending prolonged time in single leg stance as she slowly lifts opposite leg to take step, able to somewhat increase step length and shorten SLS time with cues  Stairs            Wheelchair Mobility    Modified Rankin (Stroke Patients Only)       Balance Overall balance assessment: Needs assistance   Sitting balance-Leahy Scale: Good       Standing balance-Leahy Scale: Poor Standing balance comment: Reliant on UE support                             Pertinent Vitals/Pain Pain Assessment: Faces Faces Pain Scale: Hurts a little bit Pain Location: Back Pain Descriptors / Indicators: Sore;Discomfort Pain Intervention(s): Monitored during session    Home Living Family/patient expects to be discharged to:: Private residence Living Arrangements: Children Available Help at Discharge: Family;Available PRN/intermittently Type of Home: House Home Access: Stairs to enter   [employer] of Steps: 2 Home Layout: One level Home Equipment: Kasandra Knudsen - single point Additional Comments: Lives with son who is not available for consistent support    Prior Function Level of Independence: Independent with  assistive device(s)         Comments: Intermittent use of SPC; indep with ADLs     Hand Dominance        Extremity/Trunk Assessment   Upper  Extremity Assessment Upper Extremity Assessment: Overall WFL for tasks assessed    Lower Extremity Assessment Lower Extremity Assessment: RLE deficits/detail;LLE deficits/detail RLE Deficits / Details: R hip flex 3/5, knee ext 4/5, knee flex 4/5 RLE Sensation: decreased light touch RLE Coordination: decreased gross motor LLE Deficits / Details: L hip flex 3/5, knee ext 3/5, knee flex 4/5 (reports chronic LLE deficits) LLE Sensation: decreased light touch       Communication   Communication: No difficulties  Cognition Arousal/Alertness: Awake/alert Behavior During Therapy: WFL for tasks assessed/performed Overall Cognitive Status: Within Functional Limits for tasks assessed                                 General Comments: WFL for simple tasks      General Comments      Exercises     Assessment/Plan    PT Assessment Patient needs continued PT services  PT Problem List Decreased strength;Decreased activity tolerance;Decreased balance;Decreased mobility;Decreased knowledge of use of DME       PT Treatment Interventions DME instruction;Gait training;Stair training;Functional mobility training;Therapeutic activities;Therapeutic exercise;Balance training;Patient/family education    PT Goals (Current goals can be found in the Care Plan section)  Acute Rehab PT Goals Patient Stated Goal: Does not feel safe to return home without assist; "I don't want to fall" PT Goal Formulation: With patient Time For Goal Achievement: 08/07/18 Potential to Achieve Goals: Fair    Frequency Min 3X/week   Barriers to discharge Decreased caregiver support      Co-evaluation               AM-PAC PT "6 Clicks" Mobility  Outcome Measure Help needed turning from your back to your side while in a flat bed without using bedrails?: None Help needed moving from lying on your back to sitting on the side of a flat bed without using bedrails?: None Help needed moving to and  from a bed to a chair (including a wheelchair)?: A Little Help needed standing up from a chair using your arms (e.g., wheelchair or bedside chair)?: A Little Help needed to walk in hospital room?: A Little Help needed climbing 3-5 steps with a railing? : A Lot 6 Click Score: 19    End of Session Equipment Utilized During Treatment: Gait belt Activity Tolerance: Patient tolerated treatment well;Patient limited by fatigue Patient left: in chair;with call bell/phone within reach Nurse Communication: Mobility status PT Visit Diagnosis: Other abnormalities of gait and mobility (R26.89);Muscle weakness (generalized) (M62.81)    Time: 0321-2248 PT Time Calculation (min) (ACUTE ONLY): 29 min   Charges:   PT Evaluation $PT Eval Moderate Complexity: 1 Mod PT Treatments $Gait Training: 8-22 mins      Mabeline Caras, PT, DPT Acute Rehabilitation Services  Pager [phone] Office [place] 07/24/2018, 12:11 PM

## 2018-07-24 NOTE — H&P (View-Only) (Signed)
[facility] Consult: 10:57 AM 07/24/2018  LOS: 0 days    Referring Provider: Dr Sloan Leiter.    Primary Care Physician:  Bradly Bienenstock., MD Primary Gastroenterologist:  Althia Forts.   Dr. Rickard Rhymes at [facility].    Reason for Consultation: Dysphagia, odynophagia.   HPI: Morgan [name] is a 52 y.o. female.  PMH IDDM, type 2.  Obesity. CVA, most recent 2015.  Thalassemia minor.  Hiatal hernia,GERD.  Hepatic steatosis.  Bipolar depression.  Hypertension.  Hyperlipidemia.  Cholecystectomy 2010.  Abdominal hysterectomy 2015. 01/2014 EGD showing minor reflux esophagitis with small linear erosion at the GE junction.  01/2014 average risk screening colonoscopy normal.  Repeat study due 01/2024.   Hospital admission 11/26 -07/02/2018 with sepsis, AKI, acute encephalopathy, uncontrolled IDDM.  C. difficile + diarrhea.  Completed outpatient oral vancomycin, diarrhea resolved.  Beginning Tuesday evening through late evening Wednesday, she had nonbloody, bilious nausea vomiting "every hour".  As she began to take liquids, starting with water, she noticed a sense of an air bubble in her upper esophageal region and pain in her esophagus with swallowing.  Eventually stuff would go down but it hurt and she had to eat small amounts.  Symptoms have improved somewhat and she is able to tolerate soft diet in small quantities.  She has long history of GERD but this is well controlled with 40 mg Protonix daily.  Normally has no issues with swallowing.  About 5 times a week she takes 2 Aleve/day.  Also on low-dose aspirin  Patient was admitted 12/28 with the esophageal pain, lethargy.  Diarrhea had resolved several days back. Mild hypokalemia, K 3.4.  Blood glucose 283. T bili 1.6, alkaline phosphatase 65,  AST/ALT 92/88. Hgb 12.2 >> 10.3.  Was 10.6 - 13.9 during recent hospitalization. MCV consistently low ranging 65 -T6. 2 days ago mild AKI, resolved today. Troponin I is negative x 2.  07/21/2018 CT angio chest negative for PE.  There was contrast extravasation into the left upper extremity.  5.5 mm right lower lobe pulmonary nodule will need follow-up CT surveillance.  Left subclavian artery stent.  Aortic atherosclerosis. 12/20/2017 abdominal ultrasound: Hepatic steatosis.  4 mm CBD.  Surgically absent gallbladder. Barium esophagram: Mild to moderate esophageal dysmotility.  No stricture, no masses, no HH.  Barium tablet slow to pass through pharynx and did not pass into the stomach likely due to dysmotility as well as her semi-supine positioning.  In terms of patient's microcytosis and history of thalassemia, MD has prescribed and she continues to take multivitamin with iron, has never received iron infusions.    Past Medical History:  Diagnosis Date  . Anxiety   . Asthma   . Bipolar depression ([place])   . Chronic bronchitis ([place])   . Chronic esophagogastric ulcer   . Chronic stomach ulcer   . Depression   . Fibroid   . GERD (gastroesophageal reflux disease)   . Heart murmur   . Hepatic steatosis   . History of blood transfusion 2010   "related to subclavian stent"  .  History of hiatal hernia   . Hyperlipidemia   . Hypertension   . Hypothyroid   . Migraine    "cerebral migraines; 1-2/month" (06/29/2018)  . 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 Stroke [facility])    "I've had 2; most recent one was ~ 2015 or before; affected balance" (06/29/2018)  . Thalassemia minor   . Type II diabetes mellitus ([place])   . Vitamin D deficiency     Past Surgical History:  Procedure Laterality Date  . ABDOMINAL HYSTERECTOMY  2015   "still have 1 ovary"  . CESAREAN SECTION  1988; 1996  . KNEE RECONSTRUCTION Left 1980  . LAPAROSCOPIC CHOLECYSTECTOMY    . SUBCLAVIAN STENT PLACEMENT Left 2010    . TONSILLECTOMY  1970    Prior to Admission medications   Medication Sig Start Date End Date Taking? Authorizing Provider  aspirin 81 MG tablet Take 81 mg by mouth daily.    Yes [provider]  Cholecalciferol (VITAMIN D3) 50 MCG (2000 UT) TABS Take 2,000-4,000 Units by mouth See admin instructions. Take 4,000 units by mouth once a day on Mon/Wed/Fri and 2,000 units on Sun/Tues/Thurs/Sat   Yes [provider]  Co-Enzyme Q-10 30 MG CAPS Take 30 mg by mouth daily.    Yes [provider]  doxepin (SINEQUAN) 25 MG capsule Take 50 mg by mouth at bedtime.    Yes [provider]  EPINEPHrine 0.3 mg/0.3 mL IJ SOAJ injection Inject 0.3 mg into the muscle once as needed (for an anaphylactic reaction).  02/08/18  Yes [provider]  FLUoxetine (PROZAC) 20 MG tablet Take 60 mg by mouth every morning.    Yes [provider]  GLUCAGON EMERGENCY 1 MG injection Inject 1 mg into the vein once as needed (FOR ONSET OF HYPOGLYCEMIA).  02/09/18  Yes [provider]  Insulin Human (INSULIN PUMP) SOLN Inject into the skin continuous. Humalog   Yes [provider]  levothyroxine (SYNTHROID, LEVOTHROID) 150 MCG tablet Take 150 mcg by mouth daily.    Yes [provider]  losartan (COZAAR) 100 MG tablet Take 100 mg by mouth daily.   Yes [provider]  Lurasidone HCl (LATUDA) 60 MG TABS Take 60 mg by mouth daily.   Yes [provider]  Multiple Vitamin (MULTIVITAMIN WITH MINERALS) TABS tablet Take 1 tablet by mouth daily.   Yes [provider]  naproxen sodium (ALEVE) 220 MG tablet Take 440 mg by mouth 2 (two) times daily as needed (for pain).    Yes [provider]  pantoprazole (PROTONIX) 20 MG tablet Take 40 mg by mouth every morning.    Yes [provider]  QUEtiapine (SEROQUEL) 50 MG tablet Take 150 mg by mouth at bedtime.   Yes [provider]  Semaglutide (OZEMPIC, 0.25 OR 0.5  MG/DOSE, [place]) Inject 0.5 mg into the skin every Monday.    Yes [provider]  Blood Glucose Monitoring Suppl (CONTOUR NEXT ONE) KIT 1 kit by Does not apply route as directed. 07/02/18   Patrecia Pour, MD    Scheduled Meds: . aspirin  81 mg Oral Daily  . doxepin  50 mg Oral QHS  . enoxaparin (LOVENOX) injection  40 mg Subcutaneous Q24H  . famotidine  20 mg Oral BID  . insulin aspart  0-15 Units Subcutaneous TID WC  . insulin aspart  0-5 Units Subcutaneous QHS  . insulin aspart  6 Units Subcutaneous TID WC  . insulin glargine  20  Units Subcutaneous Daily  . levothyroxine  150 mcg Oral QAC breakfast  . lurasidone  60 mg Oral Q breakfast  . nystatin  5 mL Oral QID  . QUEtiapine  150 mg Oral QHS   Infusions: . sodium chloride Stopped (07/23/18 1308)  . sodium chloride 75 mL/hr at 07/24/18 0600   PRN Meds: acetaminophen, alum & mag hydroxide-simeth, nitroGLYCERIN   Allergies as of 07/21/2018 - Review Complete 07/21/2018  Allergen Reaction Noted  . Ilosone [erythromycin] Anaphylaxis 08/02/2013  . Keflex [cephalexin] Anaphylaxis 08/02/2013  . Penicillins Anaphylaxis 08/02/2013  . Iron Other (See Comments) 08/02/2013  . Metformin and related Other (See Comments) 08/02/2013  . Morphine and related Other (See Comments) 08/02/2013    Family History  Problem Relation Age of Onset  . Diabetes Mother   . Hypertension Mother   . Hyperlipidemia Mother   . CAD Mother   . CAD Father   . Heart failure Father   . Hypertension Sister   . Hyperlipidemia Sister   . Hypertension Brother   . Hyperlipidemia Brother     Social History   Socioeconomic History  . Marital status: Divorced    Spouse name: Not on file  . Number of children: Not on file  . Years of education: Not on file  . Highest education level: Not on file  Occupational History  . Occupation: Electrical engineer support  Social Needs  . Financial resource strain: Not on file  . Food insecurity:    Worry: Not  on file    Inability: Not on file  . Transportation needs:    Medical: Not on file    Non-medical: Not on file  Tobacco Use  . Smoking status: Former Smoker    Packs/day: 2.00    Years: 36.00    Pack years: 72.00    Types: Cigarettes    Last attempt to quit: 05/14/2014    Years since quitting: 4.1  . Smokeless tobacco: Never Used  Substance and Sexual Activity  . Alcohol use: Not Currently  . Drug use: Not Currently  . Sexual activity: Not Currently  Lifestyle  . Physical activity:    Days per week: Not on file    Minutes per session: Not on file  . Stress: Not on file  Relationships  . Social connections:    Talks on phone: Not on file    Gets together: Not on file    Attends religious service: Not on file    Active member of club or organization: Not on file    Attends meetings of clubs or organizations: Not on file    Relationship status: Not on file  . Intimate partner violence:    Fear of current or ex partner: Not on file    Emotionally abused: Not on file    Physically abused: Not on file    Forced sexual activity: Not on file  Other Topics Concern  . Not on file  Social History Narrative  . Not on file    REVIEW OF SYSTEMS: Constitutional: Patient quite inactive.  If she walks to her mailbox which is not very far, she gets out of breath.  Her conditioning has worsened with recent hospitalizations so it is a challenge for her to walk more than about 20 feet. ENT:  No nose bleeds Pulm: Dyspnea on exertion, worse than usual.  No cough no dyspnea at rest. CV:  No palpitations, no LE edema.  No chest pain. GU:  No hematuria, no frequency. GI:  Per HPI Heme: Denies excessive or unusual bleeding/bruising. Transfusions: In 2010 related to subclavian stent. Neuro:  No headaches, no peripheral tingling or numbness Derm:  No itching, no rash or sores.  Endocrine:  No sweats or chills.  No polyuria or dysuria Immunization: Current on her flu shot. Travel:  None  beyond local counties in last few months.    PHYSICAL EXAM: Vital signs in last 24 hours: Vitals:   07/24/18 0552 07/24/18 0559  BP: (!) 173/91 123/63  Pulse: 86 83  Resp: 18   Temp: 98.2 F (36.8 C)   SpO2: 93%    Wt Readings from Last 3 Encounters:  07/24/18 109.1 kg  06/30/18 113.8 kg  01/05/18 97.5 kg    General: Patient looks chronically ill, obese.  No acute distress.  Alert in bed, comfortable. Head: Facial asymmetry or swelling.  No signs of head trauma. Eyes: No scleral icterus.  Slight conjunctival pallor.  EOMI. Ears: Not hard of hearing Nose: No discharge or congestion. Mouth: Oral mucosa slightly dry, pink, clear.  Tongue midline.  Poor dentition. Neck: No JVD, no masses, no thyromegaly. Lungs: Clear bilaterally, no labored breathing or cough. Heart: RRR.  No MRG.  S1, S2 present. Abdomen: Soft.  Not tender or distended.  No HSM, masses, bruits, hernias.  Active bowel sounds..   Rectal: Did not perform. Musc/Skeltl: No gross joint deformities, redness or swelling. Extremities: Lower extremity edema, nonpitting. Neurologic: Alert.  Oriented x3.  No tremors, no limb weakness. Skin: No jaundice, no rashes, no sores.  A few hematomas consistent with Lovenox and insulin injection in the lower abdomen. Nodes: No cervical or inguinal adenopathy. Psych: Operative, pleasant, calm.  Intake/Output from previous day: 12/22 0701 - 12/23 0700 In: 2467.4 [P.O.:960; I.V.:1407.4; IV Piggyback:100] Out: -  Intake/Output this shift: Total I/O In: 540.1 [P.O.:240; I.V.:300.1] Out: -   LAB RESULTS: Recent Labs    07/22/18 0428 07/23/18 0557 07/24/18 0641  WBC 13.2* 7.7 7.6  HGB 12.2 10.8* 10.3*  HCT 40.0 35.5* 34.5*  PLT 340 263 264   BMET Lab Results  Component Value Date   NA 136 07/24/2018   NA 136 07/23/2018   NA 135 07/22/2018   K 3.4 (L) 07/24/2018   K 3.6 07/23/2018   K 3.6 07/22/2018   CL 106 07/24/2018   CL 104 07/23/2018   CL 100 07/22/2018    CO2 23 07/24/2018   CO2 20 (L) 07/23/2018   CO2 23 07/22/2018   GLUCOSE 271 (H) 07/24/2018   GLUCOSE 283 (H) 07/23/2018   GLUCOSE 269 (H) 07/22/2018   BUN 8 07/24/2018   BUN 15 07/23/2018   BUN 28 (H) 07/22/2018   CREATININE 0.76 07/24/2018   CREATININE 0.96 07/23/2018   CREATININE 1.09 (H) 07/22/2018   CALCIUM 8.0 (L) 07/24/2018   CALCIUM 8.1 (L) 07/23/2018   CALCIUM 8.3 (L) 07/22/2018   LFT Recent Labs    07/21/18 1333 07/22/18 0428  PROT 6.8 5.9*  ALBUMIN 3.6 3.1*  AST 95* 92*  ALT 95* 88*  ALKPHOS 76 65  BILITOT 1.6* 1.6*   PT/INR No results found for: INR, PROTIME Hepatitis Panel No results for input(s): HEPBSAG, HCVAB, HEPAIGM, HEPBIGM in the last 72 hours. C-Diff No components found for: CDIFF Lipase     Component Value Date/Time   LIPASE 23 04/30/2018 1339    Drugs of Abuse     Component Value Date/Time   LABOPIA NONE DETECTED 06/27/2018 [place] DETECTED 06/27/2018 0455  LABBENZ NONE DETECTED 06/27/2018 0455   AMPHETMU NONE DETECTED 06/27/2018 0455   THCU NONE DETECTED 06/27/2018 0455   LABBARB NONE DETECTED 06/27/2018 0455     RADIOLOGY STUDIES: Dg Esophagus  Result Date: 07/24/2018 CLINICAL DATA:  Odynophagia. Feels like food and pills get stuck in throat. EXAM: ESOPHOGRAM/BARIUM SWALLOW TECHNIQUE: Single contrast examination was performed using thin barium and barium tablet. FLUOROSCOPY TIME:  Fluoroscopy Time:  1 minutes 6 second Radiation Exposure Index (if provided by the fluoroscopic device): Number of Acquired Spot Images: 0 COMPARISON:  None. FINDINGS: Examination is limited due to limited mobility the patient. The study was performed in the left posterior oblique position with the patient head elevated approximately 20 degrees. Pharyngeal swallowing is negative. No aspiration. No stricture in the pharynx or esophagus. Mild to moderate esophageal dysmotility. Mild dilatation esophagus with decreased motility. No esophageal  stricture or mass Negative for hiatal hernia Barium tablet initially with slow to pass the vallecula. Barium tablet then passed readily into the distal esophagus where it stayed for several minutes. No stricture is identified. This could be due to poor esophageal motility. IMPRESSION: Mild to moderate esophageal dysmotility. No stricture or mass. No hiatal hernia Barium tablet was slow to pass through the pharynx and also did not pass into the stomach. This is likely due to dysmotility as well as semi supine positioning. Electronically Signed   By: Franchot Gallo M.D.   On: 07/24/2018 09:22     IMPRESSION:   *   Acute odyno/dysphagia following 24 to 36 hours of frequent nonbloody nausea and vomiting.  Diagnosis of GERD which is normally very well controlled with daily pantoprazole.  Esophageal dysmotility instructed on barium esophagram. EGD in 2015 confirming mild esophagitis. Suspect exacerbation of esophagitis given recent, possibly viral, nausea vomiting.  Possible Candidial esophagitis given poorly controlled DM.    *    C. difficile infection, diarrhea resolved after course of oral vancomycin.  *    Microcytosis with chronic anemia.  History thalassemia, takes oral iron at home.  *    Normal average risk screening colonoscopy in 2015.  Not due for repeat study until 2025    PLAN:     *  EGD tomorrow.  Time TBD.  NPO after midnight.   Check iron studies.  May benefit from [facility].    Azucena Freed  07/24/2018, 10:57 AM Phone [phone]

## 2018-07-25 ENCOUNTER — Inpatient Hospital Stay (HOSPITAL_COMMUNITY): Payer: 59 | Admitting: Certified Registered"

## 2018-07-25 ENCOUNTER — Encounter (HOSPITAL_COMMUNITY)
Admission: EM | Disposition: A | Discharge status: Home Health | Payer: Self-pay | Source: Home / Self Care | Attending: Internal Medicine

## 2018-07-25 ENCOUNTER — Encounter (HOSPITAL_COMMUNITY): Payer: Self-pay | Admitting: *Deleted

## 2018-07-25 DIAGNOSIS — F319 Bipolar disorder, unspecified: Secondary | ICD-10-CM

## 2018-07-25 DIAGNOSIS — E669 Obesity, unspecified: Secondary | ICD-10-CM

## 2018-07-25 DIAGNOSIS — R079 Chest pain, unspecified: Secondary | ICD-10-CM

## 2018-07-25 DIAGNOSIS — R1310 Dysphagia, unspecified: Secondary | ICD-10-CM

## 2018-07-25 DIAGNOSIS — E1169 Type 2 diabetes mellitus with other specified complication: Secondary | ICD-10-CM

## 2018-07-25 DIAGNOSIS — K209 Esophagitis, unspecified without bleeding: Secondary | ICD-10-CM

## 2018-07-25 HISTORY — PX: ESOPHAGOGASTRODUODENOSCOPY (EGD) WITH PROPOFOL: SHX5813

## 2018-07-25 HISTORY — PX: BIOPSY: SHX5522

## 2018-07-25 LAB — IRON AND TIBC
Iron: 80 ug/dL (ref 28–170)
Saturation Ratios: 26 % (ref 10.4–31.8)
TIBC: 314 ug/dL (ref 250–450)
UIBC: 234 ug/dL

## 2018-07-25 LAB — RETICULOCYTES
Immature Retic Fract: 10.3 % (ref 2.3–15.9)
RBC.: 5.57 MIL/uL — ABNORMAL HIGH (ref 3.87–5.11)
RETIC COUNT ABSOLUTE: 129.2 10*3/uL (ref 19.0–186.0)
Retic Ct Pct: 2.3 % (ref 0.4–3.1)

## 2018-07-25 LAB — GLUCOSE, CAPILLARY
Glucose-Capillary: 215 mg/dL — ABNORMAL HIGH (ref 70–99)
Glucose-Capillary: 179 mg/dL — ABNORMAL HIGH (ref 70–99)
Glucose-Capillary: 276 mg/dL — ABNORMAL HIGH (ref 70–99)
Glucose-Capillary: 342 mg/dL — ABNORMAL HIGH (ref 70–99)

## 2018-07-25 LAB — FOLATE: Folate: 19.3 ng/mL (ref >5.9)

## 2018-07-25 LAB — BASIC METABOLIC PANEL
Anion gap: 9 (ref 5–15)
BUN: 6 mg/dL (ref 6–20)
CALCIUM: 8.8 mg/dL — AB (ref 8.9–10.3)
CO2: 27 mmol/L (ref 22–32)
Chloride: 101 mmol/L (ref 98–111)
Creatinine, Ser: 0.79 mg/dL (ref 0.44–1.00)
GFR calc Af Amer: >60 mL/min (ref >60)
GFR calc non Af Amer: >60 mL/min (ref >60)
Glucose, Bld: 253 mg/dL — ABNORMAL HIGH (ref 70–99)
Potassium: 3.2 mmol/L — ABNORMAL LOW (ref 3.5–5.1)
Sodium: 137 mmol/L (ref 135–145)

## 2018-07-25 LAB — MAGNESIUM: Magnesium: 1.7 mg/dL (ref 1.7–2.4)

## 2018-07-25 LAB — FERRITIN: Ferritin: 59 ng/mL (ref 11–307)

## 2018-07-25 LAB — VITAMIN B12: Vitamin B-12: 426 pg/mL (ref 180–914)

## 2018-07-25 SURGERY — ESOPHAGOGASTRODUODENOSCOPY (EGD) WITH PROPOFOL
Anesthesia: Monitor Anesthesia Care

## 2018-07-25 MED ORDER — PROPOFOL 10 MG/ML IV BOLUS
INTRAVENOUS | Status: DC | PRN
  Administered 2018-07-25 (×2): 20 mg via INTRAVENOUS

## 2018-07-25 MED ORDER — LIDOCAINE HCL (PF) 2 % IJ SOLN
INTRAMUSCULAR | Status: DC | PRN
  Administered 2018-07-25: 60 mg via INTRADERMAL

## 2018-07-25 MED ORDER — BUTAMBEN-TETRACAINE-BENZOCAINE 2-2-14 % EX AERO
INHALATION_SPRAY | CUTANEOUS | Status: DC | PRN
  Administered 2018-07-25: 2 via TOPICAL

## 2018-07-25 MED ORDER — INSULIN GLARGINE 100 UNIT/ML ~~LOC~~ SOLN
40.0000 [IU] | Freq: Every day | SUBCUTANEOUS | Status: DC
  Administered 2018-07-25 – 2018-07-26 (×2): 40 [IU] via SUBCUTANEOUS
  Filled 2018-07-25 (×2): qty 0.4

## 2018-07-25 MED ORDER — SUCRALFATE 1 GM/10ML PO SUSP
1.0000 g | Freq: Three times a day (TID) | ORAL | Status: DC
  Administered 2018-07-25 (×2): 1 g via ORAL
  Filled 2018-07-25 (×2): qty 10

## 2018-07-25 MED ORDER — GLYCOPYRROLATE 0.2 MG/ML IJ SOLN
INTRAMUSCULAR | Status: DC | PRN
  Administered 2018-07-25 (×2): 0.1 mg via INTRAVENOUS

## 2018-07-25 MED ORDER — INSULIN ASPART 100 UNIT/ML ~~LOC~~ SOLN
8.0000 [IU] | Freq: Three times a day (TID) | SUBCUTANEOUS | Status: DC
  Administered 2018-07-25 – 2018-07-26 (×4): 8 [IU] via SUBCUTANEOUS

## 2018-07-25 MED ORDER — PROPOFOL 500 MG/50ML IV EMUL
INTRAVENOUS | Status: DC | PRN
  Administered 2018-07-25: 75 ug/kg/min via INTRAVENOUS

## 2018-07-25 MED ORDER — PANTOPRAZOLE SODIUM 40 MG PO TBEC
40.0000 mg | DELAYED_RELEASE_TABLET | Freq: Two times a day (BID) | ORAL | Status: DC
  Administered 2018-07-25 – 2018-07-26 (×3): 40 mg via ORAL
  Filled 2018-07-25 (×2): qty 1

## 2018-07-25 MED ORDER — SUCRALFATE 1 GM/10ML PO SUSP
1.0000 g | Freq: Three times a day (TID) | ORAL | Status: DC
  Administered 2018-07-26 (×2): 1 g via ORAL
  Filled 2018-07-25 (×2): qty 10

## 2018-07-25 MED ORDER — POTASSIUM CHLORIDE 10 MEQ/100ML IV SOLN
10.0000 meq | INTRAVENOUS | Status: AC
  Administered 2018-07-25 (×2): 10 meq via INTRAVENOUS
  Filled 2018-07-25 (×2): qty 100

## 2018-07-25 SURGICAL SUPPLY — 15 items

## 2018-07-25 NOTE — Progress Notes (Signed)
[facility]                                                                              Patient Demographics  Morgan [name], is a 52 y.o. female, DOB - 1965-09-14, TDV:[id number]  Admit date - 07/21/2018   Admitting Physician Vianne Bulls, MD  Outpatient Primary MD for the patient is Bradly Bienenstock., MD  Outpatient specialists:   LOS - 1  days   Medical records reviewed and are as summarized below:    Chief Complaint  Patient presents with  . Chest Pain  . Weakness       Brief summary   Patient is a 52 year old female with history of hypertension, bipolar disorder, diabetes mellitus, recent history of C. difficile colitis approximately 1 month ago presented with 2 to 3 days history of odynophagia with dysphagia.  She also reported nausea and vomiting.   Assessment & Plan    Principal Problem:   Odynophagia with dysphagia -Per patient to both solids and liquids. -Upper GI series did not show any stricture or mass -GI consulted, plan for endoscopy today -Currently on empiric IV Diflucan, will discontinue -EGD showed grade D esophagitis, biopsied, likely cause of her recent symptoms, nonbleeding erosive gastropathy. -Recommended famotidine 20 mg twice daily, pantoprazole 40 mg twice daily for 8 weeks and add Carafate 1 g 3 times daily for 2 weeks. -Outpatient follow-up with Dr. Koleen Distance at [facility] all of our GI -Will start clear liquid diet and advance as tolerated   Active problems Atypical chest pain -Resolved, likely noncardiac/GI etiology -CT angiogram chest negative for PE, troponins, EKG negative, no further cardiac work-up  Acute kidney injury -Likely due to volume depletion, poor oral intake, nausea and vomiting -Patient was placed on IV fluid hydration -Presented with creatinine of 1.1, resolved to 0.79,  Hypokalemia -Placed on IV potassium for replacement as patient was n.p.o.  Insulin-dependent diabetes  mellitus -Uncontrolled, increase Lantus to 40 units daily, NovoLog meal coverage 8 units 3 times daily AC, sliding scale insulin -We will check hemoglobin A1c in a.m.  Essential hypertension BP currently stable, continue Cozaar   Hypothyroidism Follow TSH, continue Synthroid  Bipolar disorder Currently stable, continue Seroquel, Latuda, doxepin  Incidental RLL nodule seen on CT chest -Follow-up CT chest in 6 to 12 months as an outpatient    Code Status: Full CODE STATUS DVT Prophylaxis: Lovenox Family Communication: Discussed in detail with the patient, all imaging results, lab results explained to the patient   Disposition Plan: Start clear liquid diet, advance diet as tolerated, repeat PT evaluation    Time Spent in minutes 35 minutes  Procedures:  EGD  Consultants:   Gastroenterology  Antimicrobials:      Medications  Scheduled Meds: . [MAR Hold] aspirin  81 mg Oral Daily  . [MAR Hold] doxepin  50 mg Oral QHS  . [MAR Hold] enoxaparin (LOVENOX) injection  40 mg Subcutaneous Q24H  . [MAR Hold] famotidine  20 mg Oral BID  . [MAR Hold] insulin aspart  0-15 Units Subcutaneous TID WC  . [MAR Hold] insulin aspart  0-5 Units Subcutaneous QHS  . [facility]  Hold] insulin aspart  6 Units Subcutaneous TID WC  . [MAR Hold] insulin glargine  20 Units Subcutaneous Daily  . [MAR Hold] levothyroxine  150 mcg Oral QAC breakfast  . [MAR Hold] losartan  100 mg Oral Daily  . [MAR Hold] lurasidone  60 mg Oral Q breakfast  . [MAR Hold] QUEtiapine  150 mg Oral QHS   Continuous Infusions: . sodium chloride 10 mL/hr at 07/24/18 2121  . [MAR Hold] fluconazole (DIFLUCAN) IV 100 mg (07/24/18 2313)   PRN Meds:.[MAR Hold] acetaminophen, [MAR Hold] alum & mag hydroxide-simeth, [MAR Hold] nitroGLYCERIN   Antibiotics   Anti-infectives (From admission, onward)   Start     Dose/Rate Route Frequency Ordered Stop   07/24/18 1300  [MAR Hold]  fluconazole (DIFLUCAN) IVPB 100 mg     (MAR Hold  since Tue 07/25/2018 at 0811. Reason: Transfer to a Procedural area.)   100 mg 50 mL/hr over 60 Minutes Intravenous Every 24 hours 07/24/18 1252          Subjective:   Levora Werden was seen and examined today.  Feels pain in the throat during swallowing.  Otherwise chest pain has resolved.  No shortness of breath. Patient denies dizziness, abdominal pain, N/V/D/C, new weakness, numbess, tingling. No acute events overnight.    Objective:   Vitals:   07/25/18 0935 07/25/18 0945 07/25/18 0955 07/25/18 1005  BP: (!) 116/50 (!) 141/84 (!) 164/76 (!) 158/69  Pulse: 85 89 88 84  Resp: 18 16 18 16   Temp:      TempSrc:      SpO2: 94% 93% 92% 93%  Weight:      Height:        Intake/Output Summary (Last 24 hours) at 07/25/2018 1014 Last data filed at 07/25/2018 0934 Gross per 24 hour  Intake 1944.51 ml  Output 2100 ml  Net -155.49 ml     Wt Readings from Last 3 Encounters:  07/25/18 108.9 kg  06/30/18 113.8 kg  01/05/18 97.5 kg     Exam  General: Alert and oriented x 3, NAD  Eyes:   HEENT:    Cardiovascular: S1 S2 auscultated, Regular rate and rhythm.  Respiratory: CTA B  Gastrointestinal: Soft, nontender, nondistended, + bowel sounds  Ext: no pedal edema bilaterally  Neuro: No new FND's  Musculoskeletal: No digital cyanosis, clubbing  Skin: No rashes  Psych: Normal affect and demeanor, alert and oriented x3    Data Reviewed:  I have personally reviewed following labs and imaging studies  Micro Results Recent Results (from the past 240 hour(s))  Urine culture     Status: None   Collection Time: 07/21/18  7:04 PM  Result Value Ref Range Status   Specimen Description URINE, RANDOM  Final   Special Requests   Final    NONE Performed at [facility], [address] [place], [place] [address]    Culture   Final    Multiple bacterial morphotypes present, none predominant. Suggest appropriate recollection if clinically indicated.   Report Status  07/22/2018 FINAL  Final    Radiology Reports Dg Chest 2 View  Result Date: 07/21/2018 CLINICAL DATA:  Heartburn EXAM: CHEST - 2 VIEW COMPARISON:  06/27/2018 FINDINGS: The heart size and mediastinal contours are within normal limits. Both lungs are clear. Stent cephalad to the aortic arch IMPRESSION: No active cardiopulmonary disease. Electronically Signed   By: Donavan Foil M.D.   On: 07/21/2018 15:57   Ct Angio Chest Pe W And/or Wo  Contrast  Result Date: 07/21/2018 CLINICAL DATA:  Chest pain.  PE suspected. EXAM: CT ANGIOGRAPHY CHEST WITH CONTRAST TECHNIQUE: Multidetector CT imaging of the chest was performed using the standard protocol during bolus administration of intravenous contrast. Multiplanar CT image reconstructions and MIPs were obtained to evaluate the vascular anatomy. There was extravasation of contrast at the end of the injection. Opacification of pulmonary arteries is diagnostic. Exam the patient. The area of the extravasation in the left forearm was blue. Equal compress was being applied. The area measured 3 x 8 cm. I outlined the area on the patient's skin. Distal sensation and blood flow was intact. CONTRAST:  79mL ISOVUE-370 IOPAMIDOL (ISOVUE-370) INJECTION 76% COMPARISON:  Two-view chest x-ray 07/21/2018 FINDINGS: Cardiovascular: The heart size is normal. Coronary artery calcifications are present. Atherosclerotic changes are noted in the aorta and at the origins the great vessels. A stent is in place at the left subclavian artery. Pulmonary artery opacification is satisfactory. No focal filling defects are present to suggest pulmonary embolus. Mediastinum/Nodes: No significant mediastinal, hilar, or axillary adenopathy is present. Esophagus is normal. Thoracic inlet is within normal limits. Lungs/Pleura: Scattered ground-glass attenuation is present bilaterally. A 7 x 4 x 4 mm pulmonary nodule is noted along the major fissure in the right lower lobe. No other focal nodule or mass  lesion is present. There is no significant pleural effusion. Upper Abdomen: Upper abdomen is remarkable for cholecystectomy. No solid organ lesions are present. Musculoskeletal: Vertebral body heights are maintained. A hemangioma is present at T2. Marrow density is otherwise normal. Sternum is intact. Ribs are within normal limits. Review of the MIP images confirms the above findings. IMPRESSION: 1. No pulmonary embolus. 2. Contrast extravasation into the left upper extremity. Recommend core compress and elevation. Recommend continued surveillance. Patient is at low risk for complication. 3. Right lower lobe pulmonary nodule associated with the minor fissure. Me in size of the nodule is 5.5 mm. Non-contrast chest CT at 6-12 months is recommended. If the nodule is stable at time of repeat CT, then future CT at 18-24 months (from today's scan) is considered optional for low-risk patients, but is recommended for high-risk patients. This recommendation follows the consensus statement: Guidelines for Management of Incidental Pulmonary Nodules Detected on CT Images: From the Fleischner Society 2017; Radiology 2017; 284:228-243. 4. Left subclavian artery stent. 5.  Aortic Atherosclerosis (ICD10-I70.0). Electronically Signed   By: San Morelle M.D.   On: 07/21/2018 18:46   Dg Esophagus  Result Date: 07/24/2018 CLINICAL DATA:  Odynophagia. Feels like food and pills get stuck in throat. EXAM: ESOPHOGRAM/BARIUM SWALLOW TECHNIQUE: Single contrast examination was performed using thin barium and barium tablet. FLUOROSCOPY TIME:  Fluoroscopy Time:  1 minutes 6 second Radiation Exposure Index (if provided by the fluoroscopic device): Number of Acquired Spot Images: 0 COMPARISON:  None. FINDINGS: Examination is limited due to limited mobility the patient. The study was performed in the left posterior oblique position with the patient head elevated approximately 20 degrees. Pharyngeal swallowing is negative. No  aspiration. No stricture in the pharynx or esophagus. Mild to moderate esophageal dysmotility. Mild dilatation esophagus with decreased motility. No esophageal stricture or mass Negative for hiatal hernia Barium tablet initially with slow to pass the vallecula. Barium tablet then passed readily into the distal esophagus where it stayed for several minutes. No stricture is identified. This could be due to poor esophageal motility. IMPRESSION: Mild to moderate esophageal dysmotility. No stricture or mass. No hiatal hernia Barium tablet was slow to  pass through the pharynx and also did not pass into the stomach. This is likely due to dysmotility as well as semi supine positioning. Electronically Signed   By: Franchot Gallo M.D.   On: 07/24/2018 09:22   Dg Chest Port 1 View  Result Date: 06/27/2018 CLINICAL DATA:  Fever EXAM: PORTABLE CHEST 1 VIEW COMPARISON:  10/10/2017 FINDINGS: The heart size and mediastinal contours are within normal limits. Both lungs are clear. The visualized skeletal structures are unremarkable. IMPRESSION: No active disease. Electronically Signed   By: Ulyses Jarred M.D.   On: 06/27/2018 05:17   US Abdomen Limited Ruq  Result Date: 07/22/2018 CLINICAL DATA:  Heartburn, nausea, vomiting, elevated LFTs EXAM: ULTRASOUND ABDOMEN LIMITED RIGHT UPPER QUADRANT COMPARISON:  07/21/2018 FINDINGS: Gallbladder: Surgically absent Common bile duct: Diameter: 4 mm Liver: Increased hepatic echogenicity compatible with hepatic steatosis. This limits evaluation for a focal hepatic abnormality. No biliary dilatation. Portal vein is patent on color Doppler imaging with normal direction of blood flow towards the liver. IMPRESSION: Remote cholecystectomy No biliary dilatation or obstruction Hepatic steatosis Electronically Signed   By: Jerilynn Mages.  Shick M.D.   On: 07/22/2018 09:14    Lab Data:  CBC: Recent Labs  Lab 07/21/18 1333 07/22/18 0428 07/23/18 0557 07/24/18 0641  WBC 19.7* 13.2* 7.7 7.6   NEUTROABS  --  6.9  --   --   HGB 13.9 12.2 10.8* 10.3*  HCT 45.3 40.0 35.5* 34.5*  MCV 65.5* 65.5* 66.0* 66.0*  PLT 423* 340 263 932   Basic Metabolic Panel: Recent Labs  Lab 07/21/18 1333 07/22/18 0428 07/23/18 0557 07/24/18 0641 07/25/18 0427  NA 131* 135 136 136 137  K 3.7 3.6 3.6 3.4* 3.2*  CL 91* 100 104 106 101  CO2 24 23 20* 23 27  GLUCOSE 405* 269* 283* 271* 253*  BUN 29* 28* 15 8 6   CREATININE 1.18* 1.09* 0.96 0.76 0.79  CALCIUM 8.9 8.3* 8.1* 8.0* 8.8*  MG  --  2.7*  --   --  1.7   GFR: Estimated Creatinine Clearance: 93.4 mL/min (by C-G formula based on SCr of 0.79 mg/dL). Liver Function Tests: Recent Labs  Lab 07/21/18 1333 07/22/18 0428  AST 95* 92*  ALT 95* 88*  ALKPHOS 76 65  BILITOT 1.6* 1.6*  PROT 6.8 5.9*  ALBUMIN 3.6 3.1*   No results for input(s): LIPASE, AMYLASE in the last 168 hours. No results for input(s): AMMONIA in the last 168 hours. Coagulation Profile: No results for input(s): INR, PROTIME in the last 168 hours. Cardiac Enzymes: Recent Labs  Lab 07/21/18 2131 07/22/18 0236  TROPONINI <0.03 <0.03   BNP (last 3 results) No results for input(s): PROBNP in the last 8760 hours. HbA1C: No results for input(s): HGBA1C in the last 72 hours. CBG: Recent Labs  Lab 07/24/18 0850 07/24/18 1204 07/24/18 1656 07/24/18 2053 07/25/18 0728  GLUCAP 228* 327* 191* 267* 215*   Lipid Profile: No results for input(s): CHOL, HDL, LDLCALC, TRIG, CHOLHDL, LDLDIRECT in the last 72 hours. Thyroid Function Tests: No results for input(s): TSH, T4TOTAL, FREET4, T3FREE, THYROIDAB in the last 72 hours. Anemia Panel: Recent Labs    07/25/18 0427  VITAMINB12 426  FOLATE 19.3  FERRITIN 59  TIBC 314  IRON 80  RETICCTPCT 2.3   Urine analysis:    Component Value Date/Time   COLORURINE YELLOW 07/21/2018 1904   APPEARANCEUR HAZY (A) 07/21/2018 1904   LABSPEC 1.039 (H) 07/21/2018 1904   PHURINE 6.0 07/21/2018 1904  GLUCOSEU 150 (A)  07/21/2018 1904   HGBUR NEGATIVE 07/21/2018 1904   BILIRUBINUR NEGATIVE 07/21/2018 1904   KETONESUR 5 (A) 07/21/2018 1904   PROTEINUR NEGATIVE 07/21/2018 1904   UROBILINOGEN 0.2 08/03/2013 2352   NITRITE NEGATIVE 07/21/2018 1904   LEUKOCYTESUR NEGATIVE 07/21/2018 1904     Kyair Ditommaso M.D. [facility] 07/25/2018, 10:14 AM  Pager: [phone] Between 7am to 7pm - call Pager - [phone]  After 7pm go to [email or web address] - password [email or web address]  Call night coverage person covering after 7pm

## 2018-07-25 NOTE — Transfer of Care (Signed)
Immediate Anesthesia Transfer of Care Note  Patient: Morgan [name]  Procedure(s) Performed: ESOPHAGOGASTRODUODENOSCOPY (EGD) WITH PROPOFOL (N/A ) BIOPSY  Patient Location: Endoscopy Unit  Anesthesia Type:MAC  Level of Consciousness: drowsy  Airway & Oxygen Therapy: Patient Spontanous Breathing and Patient connected to face mask oxygen  Post-op Assessment: Report given to RN and Post -op Vital signs reviewed and stable  Post vital signs: Reviewed and stable  Last Vitals:  Vitals Value Taken Time  BP    Temp    Pulse    Resp    SpO2      Last Pain:  Vitals:   07/25/18 0815  TempSrc: Oral  PainSc: 0-No pain      Patients Stated Pain Goal: 0 (86/38/17 7116)  Complications: No apparent anesthesia complications

## 2018-07-25 NOTE — Op Note (Signed)
[facility] Patient Name: Morgan [name] Procedure Date : 07/25/2018 MRN: [id number] Attending MD: Thornton Park MD, MD Date of Birth: 08-23-1965 CSN: [id number] Age: 52 Admit Type: Inpatient Procedure:                Upper GI endoscopy Indications:              Dysphagia, Odynophagia, long standing history of                            GERD Providers:                Thornton Park MD, MD, Vista Lawman, RN,                            William Dalton, Technician Referring MD:              Medicines:                See the Anesthesia note for documentation of the                            administered medications Complications:            No immediate complications. Estimated blood loss:                            Minimal. Estimated Blood Loss:     Estimated blood loss was minimal. Procedure:                Pre-Anesthesia Assessment:                           - Prior to the procedure, a History and Physical                            was performed, and patient medications and                            allergies were reviewed. The patient's tolerance of                            previous anesthesia was also reviewed. The risks                            and benefits of the procedure and the sedation                            options and risks were discussed with the patient.                            All questions were answered, and informed consent                            was obtained. Prior Anticoagulants: The patient has                            taken no previous anticoagulant  or antiplatelet                            agents. ASA Grade Assessment: III - A patient with                            severe systemic disease. After reviewing the risks                            and benefits, the patient was deemed in                            satisfactory condition to undergo the procedure.                           After obtaining informed consent, the  endoscope was                            passed under direct vision. Throughout the                            procedure, the patient's blood pressure, pulse, and                            oxygen saturations were monitored continuously. The                            GIF-H190 ([id number]) Olympus Adult EGD was introduced                            through the mouth, and advanced to the third part                            of duodenum. The upper GI endoscopy was                            accomplished without difficulty. The patient                            tolerated the procedure well. Scope In: Scope Out: Findings:      LA Grade D (one or more mucosal breaks involving at least 75% of       esophageal circumference) esophagitis with no bleeding was found.       Biopsies were taken with a cold forceps for histology. Estimated blood       loss was minimal.      A few localized, small non-bleeding erosions were found in the gastric       body. There were no stigmata of recent bleeding. Biopsies were taken       with a cold forceps for histology. Estimated blood loss was minimal.       Small hiatal hernia present.      The examined duodenum was normal.      The exam was otherwise without abnormality. Impression:               -  LA Grade D esophagitis. Biopsied. This is the                            likely cause of her recent symptoms.                           - Non-bleeding erosive gastropathy. Biopsied.                           - Small hiatal hernia.                           - Normal examined duodenum.                           - The examination was otherwise normal. Recommendation:           - Return patient to hospital ward for ongoing care.                           - Advance diet as tolerated.                           - Continue present medications including Famotidine                            20 mg BID.                           - Pantoprazole 40 mg BID x 8 weeks.                            - Add Carafate 1gram slurry TID x 2 weeks.                           - Await pathology results. We will notify the                            patient when the results are available.                           - Avoid all NSAIDs.                           - No additional inpatient GI evaluation planned at                            this time. GI will move to stand-by. Please call                            the oncall gastroenterologist with any additional                            questions or concerns during this hospitalization.                           -  Follow-up with Dr. Renee Harder at [facility] (her                            gastroenterologist) or with [facility]                            Gastroenterology as needed after discharge. Procedure Code(s):        --- Professional ---                           [phone], Esophagogastroduodenoscopy, flexible,                            transoral; with biopsy, single or multiple Diagnosis Code(s):        --- Professional ---                           K20.9, Esophagitis, unspecified                           K31.89, Other diseases of stomach and duodenum                           R13.10, Dysphagia, unspecified CPT copyright 2018 American Medical Association. All rights reserved. The codes documented in this report are preliminary and upon coder review may  be revised to meet current compliance requirements. Thornton Park MD, MD 07/25/2018 10:16:02 AM This report has been signed electronically. Number of Addenda: 0

## 2018-07-25 NOTE — Anesthesia Postprocedure Evaluation (Signed)
Anesthesia Post Note  Patient: Morgan [name]  Procedure(s) Performed: ESOPHAGOGASTRODUODENOSCOPY (EGD) WITH PROPOFOL (N/A ) BIOPSY     Patient location during evaluation: PACU Anesthesia Type: MAC Level of consciousness: awake and alert Pain management: pain level controlled Vital Signs Assessment: post-procedure vital signs reviewed and stable Respiratory status: spontaneous breathing, nonlabored ventilation, respiratory function stable and patient connected to nasal cannula oxygen Cardiovascular status: stable and blood pressure returned to baseline Postop Assessment: no apparent nausea or vomiting Anesthetic complications: no    Last Vitals:  Vitals:   07/25/18 1005 07/25/18 1223  BP: (!) 158/69 (!) 158/75  Pulse: 84 82  Resp: 16 20  Temp:  37.1 C  SpO2: 93% 95%    Last Pain:  Vitals:   07/25/18 1223  TempSrc: Oral  PainSc:                  Karyl Kinnier Ellender

## 2018-07-25 NOTE — Interval H&P Note (Signed)
History and Physical Interval Note:  07/25/2018 8:16 AM  Morgan [name]  has presented today for surgery, with the diagnosis of dys/odynophagia  The various methods of treatment have been discussed with the patient and family. After consideration of risks, benefits and other options for treatment, the patient has consented to  Procedure(s): ESOPHAGOGASTRODUODENOSCOPY (EGD) WITH PROPOFOL (N/A) as a surgical intervention .  The patient's history has been reviewed, patient examined, no change in status, stable for surgery.  I have reviewed the patient's chart and labs.  Questions were answered to the patient's satisfaction.     Thornton Park

## 2018-07-25 NOTE — Progress Notes (Signed)
Patient c/o HA on pain scale states 8/10. States she has hx of HA and normally takes aleve. Medicated with tylenol. Also c/o midsternal chest burning. Says it is the same as what brought her here. Medicated with scheduled protonix and pepcid to include prn maalox. On return assessment she states tylenol helped a bit and her heartburn is relieved. Instructed her to call if ha does not improve.

## 2018-07-25 NOTE — Plan of Care (Signed)
  Problem: Nutrition: Goal: Adequate nutrition will be maintained 07/25/2018 2323 by Georjean Mode, RN Outcome: Progressing 07/25/2018 2321 by Georjean Mode, RN Outcome: Progressing   Problem: Pain Managment: Goal: General experience of comfort will improve 07/25/2018 2323 by Georjean Mode, RN Outcome: Progressing 07/25/2018 2321 by Georjean Mode, RN Outcome: Progressing   Problem: Safety: Goal: Ability to remain free from injury will improve Outcome: Progressing

## 2018-07-25 NOTE — Progress Notes (Signed)
PT Cancellation Note  Patient Details Name: Morgan [name] MRN: [id number] DOB: March 03, 1966   Cancelled Treatment:    Reason Eval/Treat Not Completed: Patient at procedure or test/unavailable (EGD with propofol). Will follow-up for PT treatment as schedule permits.  Mabeline Caras, PT, DPT Acute Rehabilitation Services  Pager [phone] Office [place] 07/25/2018, 8:38 AM

## 2018-07-25 NOTE — Plan of Care (Signed)
  Problem: Nutrition: Goal: Adequate nutrition will be maintained Outcome: Progressing   Problem: Pain Managment: Goal: General experience of comfort will improve Outcome: Progressing   

## 2018-07-25 NOTE — Progress Notes (Signed)
Inpatient Diabetes Program Recommendations  AACE/ADA: New Consensus Statement on Inpatient Glycemic Control (2019)  Target Ranges:  Prepandial:   less than 140 mg/dL      Peak postprandial:   less than 180 mg/dL (1-2 hours)      Critically ill patients:  140 - 180 mg/dL   Results for Morgan [name], Morgan [name] (MRN [id number]) as of 07/25/2018 08:31  Ref. Range 07/24/2018 08:50 07/24/2018 12:04 07/24/2018 16:56 07/24/2018 20:53 07/25/2018 07:28  Glucose-Capillary Latest Ref Range: 70 - 99 mg/dL 228 (H)  Novolog 5 units  Lantus 20 units 327 (H)  Novolog 17 units 191 (H)  Novolog 15 units 267 (H)  Novolog 3 units 215 (H)  Novolog 9 units   Results for Morgan [name], Morgan [name] (MRN [id number]) as of 07/24/2018 08:18  Ref. Range 07/23/2018 07:39 07/23/2018 12:41 07/23/2018 17:12 07/23/2018 21:29  Glucose-Capillary Latest Ref Range: 70 - 99 mg/dL 266 (H) 266 (H) 286 (H) 274 (H)    Review of Glycemic Control   Current orders for Inpatient glycemic control: Lantus 20 units daily, Novolog 6 units TID with meals, Novolog 0-15 units TID with meals, Novolog 0-5 units QHS  Inpatient Diabetes Program Recommendations:  Insulin - Basal: Over the past 24 hours patient has received a total of Novolog 49 units for correction. Note patient is NPO for procedure this morning but anticipate patient would benefit from increased dose of Lantus. Please consider increasing Lantus to 40 units daily. Insulin - Meal Coverage: Please consider increasing meal coverage to Novolog 8 units TID with meals.   Thanks, Barnie Alderman, RN, MSN, CDE Diabetes Coordinator Inpatient Diabetes Program [phone] (Team Pager from 8am to 5pm)

## 2018-07-25 NOTE — Anesthesia Preprocedure Evaluation (Addendum)
Anesthesia Evaluation  Patient identified by MRN, date of birth, ID band Patient awake    Reviewed: Allergy & Precautions, NPO status , Patient's Chart, lab work & pertinent test results  Airway Mallampati: III  TM Distance: >3 FB Neck ROM: Full    Dental  (+) Chipped,    Pulmonary asthma , COPD, former smoker,    Pulmonary exam normal breath sounds clear to auscultation       Cardiovascular hypertension, Pt. on medications Normal cardiovascular exam Rhythm:Regular Rate:Normal  ECG: NSR, rate 87   Neuro/Psych  Headaches, PSYCHIATRIC DISORDERS Anxiety Bipolar Disorder CVA, Residual Symptoms    GI/Hepatic hiatal hernia, PUD, GERD  Medicated and Controlled,Hepatic steatosis   Endo/Other  diabetes, Insulin DependentHypothyroidism Morbid obesity  Renal/GU negative Renal ROS     Musculoskeletal negative musculoskeletal ROS (+)   Abdominal   Peds  Hematology  (+) anemia , HLD   Anesthesia Other Findings dys/odynophagia  Reproductive/Obstetrics                            Anesthesia Physical Anesthesia Plan  ASA: III  Anesthesia Plan: MAC   Post-op Pain Management:    Induction: Intravenous  PONV Risk Score and Plan: 3 and Propofol infusion and Treatment may vary due to age or medical condition  Airway Management Planned: Nasal Cannula  Additional Equipment:   Intra-op Plan:   Post-operative Plan:   Informed Consent: I have reviewed the patients History and Physical, chart, labs and discussed the procedure including the risks, benefits and alternatives for the proposed anesthesia with the patient or authorized representative who has indicated his/her understanding and acceptance.   Dental advisory given  Plan Discussed with: CRNA  Anesthesia Plan Comments:         Anesthesia Quick Evaluation

## 2018-07-26 LAB — CBC
HCT: 39.5 % (ref 36.0–46.0)
Hemoglobin: 11.8 g/dL — ABNORMAL LOW (ref 12.0–15.0)
MCH: 19.8 pg — ABNORMAL LOW (ref 26.0–34.0)
MCHC: 29.9 g/dL — ABNORMAL LOW (ref 30.0–36.0)
MCV: 66.2 fL — ABNORMAL LOW (ref 80.0–100.0)
Platelets: 276 10*3/uL (ref 150–400)
RBC: 5.97 MIL/uL — ABNORMAL HIGH (ref 3.87–5.11)
RDW: 16.7 % — ABNORMAL HIGH (ref 11.5–15.5)
WBC: 9 10*3/uL (ref 4.0–10.5)
nRBC: 0 % (ref 0.0–0.2)

## 2018-07-26 LAB — BASIC METABOLIC PANEL
Anion gap: 13 (ref 5–15)
BUN: <5 mg/dL — ABNORMAL LOW (ref 6–20)
CO2: 25 mmol/L (ref 22–32)
Calcium: 9.1 mg/dL (ref 8.9–10.3)
Chloride: 100 mmol/L (ref 98–111)
Creatinine, Ser: 0.62 mg/dL (ref 0.44–1.00)
GFR calc Af Amer: >60 mL/min (ref >60)
GFR calc non Af Amer: >60 mL/min (ref >60)
Glucose, Bld: 191 mg/dL — ABNORMAL HIGH (ref 70–99)
Potassium: 3.3 mmol/L — ABNORMAL LOW (ref 3.5–5.1)
Sodium: 138 mmol/L (ref 135–145)

## 2018-07-26 LAB — GLUCOSE, CAPILLARY
Glucose-Capillary: 192 mg/dL — ABNORMAL HIGH (ref 70–99)
Glucose-Capillary: 278 mg/dL — ABNORMAL HIGH (ref 70–99)

## 2018-07-26 LAB — TSH: TSH: 2.978 u[IU]/mL (ref 0.350–4.500)

## 2018-07-26 MED ORDER — NITROGLYCERIN 0.4 MG SL SUBL: 0.4000 mg | SUBLINGUAL_TABLET | SUBLINGUAL | 12 refills | Status: DC | PRN

## 2018-07-26 MED ORDER — BUTALBITAL-APAP-CAFFEINE 50-325-40 MG PO TABS: 1.0000 | ORAL_TABLET | Freq: Four times a day (QID) | ORAL | 0 refills | Status: AC | PRN

## 2018-07-26 MED ORDER — FAMOTIDINE 20 MG PO TABS: 20.0000 mg | ORAL_TABLET | Freq: Two times a day (BID) | ORAL | 0 refills | Status: DC

## 2018-07-26 MED ORDER — SUCRALFATE 1 G PO TABS: 1.0000 g | ORAL_TABLET | Freq: Three times a day (TID) | ORAL | 1 refills | Status: DC

## 2018-07-26 MED ORDER — POTASSIUM CHLORIDE CRYS ER 20 MEQ PO TBCR
40.0000 meq | EXTENDED_RELEASE_TABLET | Freq: Once | ORAL | Status: AC
  Administered 2018-07-26: 40 meq via ORAL
  Filled 2018-07-26: qty 2

## 2018-07-26 MED ORDER — PANTOPRAZOLE SODIUM 40 MG PO TBEC: 40.0000 mg | DELAYED_RELEASE_TABLET | Freq: Two times a day (BID) | ORAL | 3 refills | Status: DC

## 2018-07-26 NOTE — Plan of Care (Signed)
  Problem: Activity: Goal: Risk for activity intolerance will decrease Outcome: Adequate for Discharge   Problem: Nutrition: Goal: Adequate nutrition will be maintained Outcome: Adequate for Discharge   Problem: Pain Managment: Goal: General experience of comfort will improve Outcome: Adequate for Discharge   Problem: Safety: Goal: Ability to remain free from injury will improve Outcome: Adequate for Discharge   Problem: Skin Integrity: Goal: Risk for impaired skin integrity will decrease Outcome: Adequate for Discharge

## 2018-07-26 NOTE — Progress Notes (Signed)
Patient is discharged home with home health, all discharge instructions given to patient, all questions answered and concerns addressed. Patient verbalized understanding.

## 2018-07-26 NOTE — Progress Notes (Signed)
Clinical Social Worker received consult to assist patient in being placed into a SNF. CSW met patient and patient stated she does not want to go to rehab but would prefer to go home with home health. Patient stated her son is in and out of the home but she has support from other family members near by. CSW signing off as patient is requesting to go home with home health.   Rhea Pink, MSW,  [place]

## 2018-07-26 NOTE — Progress Notes (Signed)
Patient ambulates well on room air and tolerated it well the whole time. Her O2. Pt c/o SOB but the O2 sat stays 100% at all time. She got little tachy but she maintains HR of 102. Will continue to monitor patient.

## 2018-07-26 NOTE — Progress Notes (Signed)
CROSS COVER [facility]-GI Subjective: Patient seems to be fairly stable today. She continues to have retrosternal discomfort with active reflux and difficulty swallowing. She is on clear liquids she denies any melena or hematochezia.  Objective: Vital signs in last 24 hours: Temp:  [98.4 F (36.9 C)-99 F (37.2 C)] 98.4 F (36.9 C) (12/25 0550) Pulse Rate:  [82-92] 83 (12/25 0550) Resp:  [18-20] 18 (12/25 0550) BP: (143-182)/(75-83) 143/83 (12/25 0550) SpO2:  [95 %-97 %] 95 % (12/25 0550) Weight:  [107.4 kg] 107.4 kg (12/25 0550) Last BM Date: 07/26/18  Intake/Output from previous day: 12/24 0701 - 12/25 0700 In: 1297 [P.O.:720; I.V.:577] Out: 800 [Urine:800] Intake/Output this shift: Total I/O In: 240 [P.O.:240] Out: -   General appearance: alert, cooperative, no distress, morbidly obese and pale Resp: clear to auscultation bilaterally Cardio: regular rate and rhythm, S1, S2 normal, no murmur, click, rub or gallop GI: soft, non-tender; bowel sounds normal; no masses,  no organomegaly Extremities: edema 2+  Lab Results: Recent Labs    07/24/18 0641 07/26/18 0531  WBC 7.6 9.0  HGB 10.3* 11.8*  HCT 34.5* 39.5  PLT 264 276   BMET Recent Labs    07/24/18 0641 07/25/18 0427 07/26/18 0531  NA 136 137 138  K 3.4* 3.2* 3.3*  CL 106 101 100  CO2 23 27 25   GLUCOSE 271* 253* 191*  BUN 8 6 <5*  CREATININE 0.76 0.79 0.62  CALCIUM 8.0* 8.8* 9.1   Medications: I have reviewed the patient's current medications.  Assessment/Plan: 1) LA grade D esophagitis with severe reflux and regurgitation/hiatal hernia on PPIs and clear liquids esophageal biopsies pending patient is to follow-up with Dr. Nelva Nay within a week or 2 of discharge. All nonsteroidals should be avoided at this time. 2) Hepatic steatosis-low fat diet has been encouraged 3) Morbid obesity. 4) Depression/ bipolar disorder.  LOS: 2 days   Donnamae Muilenburg 07/26/2018, 11:13 AM

## 2018-07-26 NOTE — Discharge Summary (Addendum)
Physician Discharge Summary   Patient ID: Morgan [name] MRN: [id number] DOB/AGE: Dec 28, 1965 52 y.o.  Admit date: 07/21/2018 Discharge date: 07/26/2018  Primary Care Physician:  Bradly Bienenstock., MD   Recommendations for Outpatient Follow-up:  1. Follow up with PCP in 1-2 weeks 2. Patient recommended to continue full liquid diet or soft diet and advance as tolerated 3. Incidental RLL nodule seen on CT chest -Follow-up CT chest in 6 to 12 months as an outpatient    Home Health: Home health PT OT, RN, home health aide Equipment/Devices: DME walker  Discharge Condition: stable  CODE STATUS: FULL Diet recommendation: Full liquid or soft solids as tolerated   Discharge Diagnoses:     Odynophagia with dysphagia secondary to severe esophagitis . Atypical chest pain . Diabetes mellitus type 2 in obese (HCC) Acute kidney injury . Essential hypertension . Hypothyroidism . Hypokalemia . Leukocytosis . Bipolar depression ([place]) . Incidental lung nodule    Consults: Gastroenterology    Allergies:   Allergies  Allergen Reactions  . Ilosone [Erythromycin] Anaphylaxis  . Keflex [Cephalexin] Anaphylaxis  . Penicillins Anaphylaxis    Has patient had a PCN reaction causing immediate rash, facial/tongue/throat swelling, SOB or lightheadedness with hypotension: YES Has patient had a PCN reaction causing severe rash involving mucus membranes or skin necrosis: NO Has patient had a PCN reaction that required hospitalizationNO Has patient had a PCN reaction occurring within the last 10 years: NO If all of the above answers are "NO", then may proceed with Cephalosporin use.  . Iron Other (See Comments)    Pt reports condition limiting Iron intake.  . Metformin And Related Other (See Comments)    Pt prefers to not take this medication  . Morphine And Related Other (See Comments)    Pt reports hallucinations     DISCHARGE MEDICATIONS: Allergies as of 07/26/2018       Reactions   Ilosone [erythromycin] Anaphylaxis   Keflex [cephalexin] Anaphylaxis   Penicillins Anaphylaxis   Has patient had a PCN reaction causing immediate rash, facial/tongue/throat swelling, SOB or lightheadedness with hypotension: YES Has patient had a PCN reaction causing severe rash involving mucus membranes or skin necrosis: NO Has patient had a PCN reaction that required hospitalizationNO Has patient had a PCN reaction occurring within the last 10 years: NO If all of the above answers are "NO", then may proceed with Cephalosporin use.   Iron Other (See Comments)   Pt reports condition limiting Iron intake.   Metformin And Related Other (See Comments)   Pt prefers to not take this medication   Morphine And Related Other (See Comments)   Pt reports hallucinations      Medication List    STOP taking these medications   naproxen sodium 220 MG tablet Commonly known as:  ALEVE     TAKE these medications   aspirin 81 MG tablet Take 81 mg by mouth daily.   butalbital-acetaminophen-caffeine 50-325-40 MG tablet Commonly known as:  FIORICET, ESGIC Take 1-2 tablets by mouth every 6 (six) hours as needed for headache or migraine.   Co-Enzyme Q-10 30 MG Caps Take 30 mg by mouth daily.   CONTOUR NEXT ONE Kit 1 kit by Does not apply route as directed.   doxepin 25 MG capsule Commonly known as:  SINEQUAN Take 50 mg by mouth at bedtime.   EPINEPHrine 0.3 mg/0.3 mL Soaj injection Commonly known as:  EPI-PEN Inject 0.3 mg into the muscle once as needed (for an anaphylactic reaction).  famotidine 20 MG tablet Commonly known as:  PEPCID Take 1 tablet (20 mg total) by mouth 2 (two) times daily.   FLUoxetine 20 MG tablet Commonly known as:  PROZAC Take 60 mg by mouth every morning.   GLUCAGON EMERGENCY 1 MG injection Generic drug:  glucagon Inject 1 mg into the vein once as needed (FOR ONSET OF HYPOGLYCEMIA).   insulin pump Soln Inject into the skin continuous. Humalog    LATUDA 60 MG Tabs Generic drug:  Lurasidone HCl Take 60 mg by mouth daily.   levothyroxine 150 MCG tablet Commonly known as:  SYNTHROID, LEVOTHROID Take 150 mcg by mouth daily.   losartan 100 MG tablet Commonly known as:  COZAAR Take 100 mg by mouth daily.   multivitamin with minerals Tabs tablet Take 1 tablet by mouth daily.   nitroGLYCERIN 0.4 MG SL tablet Commonly known as:  NITROSTAT Place 1 tablet (0.4 mg total) under the tongue every 5 (five) minutes x 3 doses as needed for chest pain.   OZEMPIC (0.25 OR 0.5 MG/DOSE) [place] Inject 0.5 mg into the skin every Monday.   pantoprazole 40 MG tablet Commonly known as:  PROTONIX Take 1 tablet (40 mg total) by mouth 2 (two) times daily before a meal. X 8 weeks What changed:    medication strength  when to take this  additional instructions   QUEtiapine 50 MG tablet Commonly known as:  SEROQUEL Take 150 mg by mouth at bedtime.   sucralfate 1 g tablet Commonly known as:  CARAFATE Take 1 tablet (1 g total) by mouth 4 (four) times daily -  with meals and at bedtime. X 2 weeks   Vitamin D3 50 MCG (2000 UT) Tabs Take 2,000-4,000 Units by mouth See admin instructions. Take 4,000 units by mouth once a day on Mon/Wed/Fri and 2,000 units on Sun/Tues/Thurs/Sat            Durable Medical Equipment  (From admission, onward)         Start     Ordered   07/26/18 0916  For home use only DME Walker rolling  Once    Question:  Patient needs a walker to treat with the following condition  Answer:  Gait instability   07/26/18 0915           Brief H and P: For complete details please refer to admission H and P, but in brief Patient is a 52 year old female with history of hypertension, bipolar disorder, diabetes mellitus, recent history of C. difficile colitis approximately 1 month ago presented with 2 to 3 days history of odynophagia with dysphagia.  She also reported nausea and vomiting.    Hospital Course:     Odynophagia with dysphagia -Per patient to both solids and liquids. Upper GI series did not show any stricture or mass -GI was consulted.  Patient underwent endoscopy which showed grade D esophagitis, biopsied, likely cause of her recent symptoms, nonbleeding erosive gastropathy. -Recommended famotidine 20 mg twice daily, pantoprazole 40 mg twice daily for 8 weeks and add Carafate 1 g 3 times daily for 2 weeks. -Patient wants to follow with GI in [place] as she is transitioning care from [place] full liquid diet, recommended advancing slowly as tolerated to soft solids   Atypical chest pain -Resolved, likely noncardiac/GI etiology -CT angiogram chest negative for PE, troponins, EKG negative, no further cardiac work-up  Acute kidney injury -Likely due to volume depletion, poor oral intake, nausea and vomiting -Patient was placed on IV  fluid hydration -Presented with creatinine of 1.1, resolved, creatinine 0.6 at the time of discharge.  Hypokalemia -Potassium 3.3 at the time of discharge, replaced  Insulin-dependent diabetes mellitus -Continue insulin per outpatient regimen, patient wants to follow endocrinology in [place].  Essential hypertension BP currently stable, continue Cozaar   Hypothyroidism Follow TSH, continue Synthroid  Bipolar disorder Currently stable, continue Seroquel, Latuda, doxepin  Incidental RLL nodule seen on CT chest -Follow-up CT chest in 6 to 12 months as an outpatient   Day of Discharge S: Tolerating full liquid diet without any difficulty, does not want to go to any rehab.  Wants to go home.  States son is at home.  BP (!) 156/63 (BP Location: Right Arm)   Pulse 81   Temp 98.5 F (36.9 C) (Oral)   Resp 20   Ht _0  (1.575 m)   Wt 107.4 kg   LMP 08/03/2013   SpO2 93%   BMI 43.29 kg/m   Physical Exam: General: Alert and awake oriented x3 not in any acute distress. HEENT: anicteric sclera, pupils reactive to  light and accommodation CVS: S1-S2 clear no murmur rubs or gallops Chest: clear to auscultation bilaterally, no wheezing rales or rhonchi Abdomen: soft nontender, nondistended, normal bowel sounds Extremities: no cyanosis, clubbing or edema noted bilaterally Neuro: Cranial nerves II-XII intact, no focal neurological deficits   The results of significant diagnostics from this hospitalization (including imaging, microbiology, ancillary and laboratory) are listed below for reference.      Procedures/Studies:  Dg Chest 2 View  Result Date: 07/21/2018 CLINICAL DATA:  Heartburn EXAM: CHEST - 2 VIEW COMPARISON:  06/27/2018 FINDINGS: The heart size and mediastinal contours are within normal limits. Both lungs are clear. Stent cephalad to the aortic arch IMPRESSION: No active cardiopulmonary disease. Electronically Signed   By: Donavan Foil M.D.   On: 07/21/2018 15:57   Ct Angio Chest Pe W And/or Wo Contrast  Result Date: 07/21/2018 CLINICAL DATA:  Chest pain.  PE suspected. EXAM: CT ANGIOGRAPHY CHEST WITH CONTRAST TECHNIQUE: Multidetector CT imaging of the chest was performed using the standard protocol during bolus administration of intravenous contrast. Multiplanar CT image reconstructions and MIPs were obtained to evaluate the vascular anatomy. There was extravasation of contrast at the end of the injection. Opacification of pulmonary arteries is diagnostic. Exam the patient. The area of the extravasation in the left forearm was blue. Equal compress was being applied. The area measured 3 x 8 cm. I outlined the area on the patient's skin. Distal sensation and blood flow was intact. CONTRAST:  29m ISOVUE-370 IOPAMIDOL (ISOVUE-370) INJECTION 76% COMPARISON:  Two-view chest x-ray 07/21/2018 FINDINGS: Cardiovascular: The heart size is normal. Coronary artery calcifications are present. Atherosclerotic changes are noted in the aorta and at the origins the great vessels. A stent is in place at the left  subclavian artery. Pulmonary artery opacification is satisfactory. No focal filling defects are present to suggest pulmonary embolus. Mediastinum/Nodes: No significant mediastinal, hilar, or axillary adenopathy is present. Esophagus is normal. Thoracic inlet is within normal limits. Lungs/Pleura: Scattered ground-glass attenuation is present bilaterally. A 7 x 4 x 4 mm pulmonary nodule is noted along the major fissure in the right lower lobe. No other focal nodule or mass lesion is present. There is no significant pleural effusion. Upper Abdomen: Upper abdomen is remarkable for cholecystectomy. No solid organ lesions are present. Musculoskeletal: Vertebral body heights are maintained. A hemangioma is present at T2. Marrow density is otherwise normal. Sternum is intact. Ribs  are within normal limits. Review of the MIP images confirms the above findings. IMPRESSION: 1. No pulmonary embolus. 2. Contrast extravasation into the left upper extremity. Recommend core compress and elevation. Recommend continued surveillance. Patient is at low risk for complication. 3. Right lower lobe pulmonary nodule associated with the minor fissure. Me in size of the nodule is 5.5 mm. Non-contrast chest CT at 6-12 months is recommended. If the nodule is stable at time of repeat CT, then future CT at 18-24 months (from today's scan) is considered optional for low-risk patients, but is recommended for high-risk patients. This recommendation follows the consensus statement: Guidelines for Management of Incidental Pulmonary Nodules Detected on CT Images: From the Fleischner Society 2017; Radiology 2017; 284:228-243. 4. Left subclavian artery stent. 5.  Aortic Atherosclerosis (ICD10-I70.0). Electronically Signed   By: San Morelle M.D.   On: 07/21/2018 18:46   Dg Esophagus  Result Date: 07/24/2018 CLINICAL DATA:  Odynophagia. Feels like food and pills get stuck in throat. EXAM: ESOPHOGRAM/BARIUM SWALLOW TECHNIQUE: Single contrast  examination was performed using thin barium and barium tablet. FLUOROSCOPY TIME:  Fluoroscopy Time:  1 minutes 6 second Radiation Exposure Index (if provided by the fluoroscopic device): Number of Acquired Spot Images: 0 COMPARISON:  None. FINDINGS: Examination is limited due to limited mobility the patient. The study was performed in the left posterior oblique position with the patient head elevated approximately 20 degrees. Pharyngeal swallowing is negative. No aspiration. No stricture in the pharynx or esophagus. Mild to moderate esophageal dysmotility. Mild dilatation esophagus with decreased motility. No esophageal stricture or mass Negative for hiatal hernia Barium tablet initially with slow to pass the vallecula. Barium tablet then passed readily into the distal esophagus where it stayed for several minutes. No stricture is identified. This could be due to poor esophageal motility. IMPRESSION: Mild to moderate esophageal dysmotility. No stricture or mass. No hiatal hernia Barium tablet was slow to pass through the pharynx and also did not pass into the stomach. This is likely due to dysmotility as well as semi supine positioning. Electronically Signed   By: Franchot Gallo M.D.   On: 07/24/2018 09:22   Dg Chest Port 1 View  Result Date: 06/27/2018 CLINICAL DATA:  Fever EXAM: PORTABLE CHEST 1 VIEW COMPARISON:  10/10/2017 FINDINGS: The heart size and mediastinal contours are within normal limits. Both lungs are clear. The visualized skeletal structures are unremarkable. IMPRESSION: No active disease. Electronically Signed   By: Ulyses Jarred M.D.   On: 06/27/2018 05:17   US Abdomen Limited Ruq  Result Date: 07/22/2018 CLINICAL DATA:  Heartburn, nausea, vomiting, elevated LFTs EXAM: ULTRASOUND ABDOMEN LIMITED RIGHT UPPER QUADRANT COMPARISON:  07/21/2018 FINDINGS: Gallbladder: Surgically absent Common bile duct: Diameter: 4 mm Liver: Increased hepatic echogenicity compatible with hepatic steatosis. This  limits evaluation for a focal hepatic abnormality. No biliary dilatation. Portal vein is patent on color Doppler imaging with normal direction of blood flow towards the liver. IMPRESSION: Remote cholecystectomy No biliary dilatation or obstruction Hepatic steatosis Electronically Signed   By: Jerilynn Mages.  Shick M.D.   On: 07/22/2018 09:14      LAB RESULTS: Basic Metabolic Panel: Recent Labs  Lab 07/25/18 0427 07/26/18 0531  NA 137 138  K 3.2* 3.3*  CL 101 100  CO2 27 25  GLUCOSE 253* 191*  BUN 6 <5*  CREATININE 0.79 0.62  CALCIUM 8.8* 9.1  MG 1.7  --    Liver Function Tests: Recent Labs  Lab 07/21/18 1333 07/22/18 0428  AST 95*  92*  ALT 95* 88*  ALKPHOS 76 65  BILITOT 1.6* 1.6*  PROT 6.8 5.9*  ALBUMIN 3.6 3.1*   No results for input(s): LIPASE, AMYLASE in the last 168 hours. No results for input(s): AMMONIA in the last 168 hours. CBC: Recent Labs  Lab 07/22/18 0428  07/24/18 0641 07/26/18 0531  WBC 13.2*   < > 7.6 9.0  NEUTROABS 6.9  --   --   --   HGB 12.2   < > 10.3* 11.8*  HCT 40.0   < > 34.5* 39.5  MCV 65.5*   < > 66.0* 66.2*  PLT 340   < > 264 276   < > = values in this interval not displayed.   Cardiac Enzymes: Recent Labs  Lab 07/21/18 2131 07/22/18 0236  TROPONINI <0.03 <0.03   BNP: Invalid input(s): POCBNP CBG: Recent Labs  Lab 07/26/18 0723 07/26/18 1133  GLUCAP 192* 278*      Disposition and Follow-up: Discharge Instructions    Discharge instructions   Complete by:  As directed    Please continue full liquid diet for the next few days and slowly start adding texture and advance to soft diet.  Follow-up outpatient with GI.  Please avoid NSAIDs (ibuprofen/Motrin/Aleve etc).  Tylenol okay.   Increase activity slowly   Complete by:  As directed        DISPOSITION: Home with home health PT, OT, RN, aide   DISCHARGE FOLLOW-UP Follow-up Information    Lucille Passy, MD. Schedule an appointment as soon as possible for a visit in 2  week(s).   Specialty:  Family Medicine Why:  For primary care Contact information: [place] [address] [phone]        Thornton Park, MD. Schedule an appointment as soon as possible for a visit in 2 week(s).   Specialty:  Gastroenterology Why:   please call office to set up follow-up appointment Contact information: [place] [address] [phone]        Philemon Kingdom, MD. Schedule an appointment as soon as possible for a visit in 2 week(s).   Specialty:  Internal Medicine Why:  For endocrinology Contact information: [address] [place] [address] [phone]            Time coordinating discharge:  35 minutes  Signed:   Estill Cotta M.D. [facility] 07/26/2018, 2:13 PM Pager: [phone]

## 2018-07-27 LAB — HEMOGLOBIN A1C
Hgb A1c MFr Bld: 10.4 % — ABNORMAL HIGH (ref 4.8–5.6)
Mean Plasma Glucose: 252 mg/dL

## 2018-07-31 ENCOUNTER — Inpatient Hospital Stay: Payer: 59 | Admitting: Family Medicine

## 2018-08-01 NOTE — Progress Notes (Signed)
Received call from [place] with [facility] [phone] ext [phone], she contacted the patient at home and was informed that she wanted [facility] services and a rolling walker; patient requested Morgan [name]; Dorian Pod with [facility] called, they cannot accept the patient, [facility] called, they are unable to accept the patient, [name] called and [place]; Dan with [facility] stated that they could accept the patient - referral made as requested; Rolling walker ordered through [employer] and will be delivered to the home. Mindi Slicker [facility] [phone]

## 2018-08-07 ENCOUNTER — Inpatient Hospital Stay: Payer: 59 | Admitting: Family Medicine

## 2018-08-09 ENCOUNTER — Emergency Department (HOSPITAL_COMMUNITY): Payer: 59

## 2018-08-09 ENCOUNTER — Other Ambulatory Visit: Payer: Self-pay

## 2018-08-09 ENCOUNTER — Observation Stay (HOSPITAL_COMMUNITY)
Admission: EM | Admit: 2018-08-09 | Discharge: 2018-08-11 | Disposition: A | Discharge status: Home / Self Care | Payer: 59 | Attending: Internal Medicine | Admitting: Internal Medicine

## 2018-08-09 ENCOUNTER — Encounter (HOSPITAL_COMMUNITY): Payer: Self-pay

## 2018-08-09 DIAGNOSIS — I708 Atherosclerosis of other arteries: Secondary | ICD-10-CM | POA: Diagnosis not present

## 2018-08-09 DIAGNOSIS — M6281 Muscle weakness (generalized): Secondary | ICD-10-CM | POA: Diagnosis not present

## 2018-08-09 DIAGNOSIS — Z9181 History of falling: Secondary | ICD-10-CM | POA: Diagnosis not present

## 2018-08-09 DIAGNOSIS — E860 Dehydration: Secondary | ICD-10-CM | POA: Insufficient documentation

## 2018-08-09 DIAGNOSIS — Z90710 Acquired absence of both cervix and uterus: Secondary | ICD-10-CM | POA: Insufficient documentation

## 2018-08-09 DIAGNOSIS — Z9049 Acquired absence of other specified parts of digestive tract: Secondary | ICD-10-CM | POA: Insufficient documentation

## 2018-08-09 DIAGNOSIS — E872 Acidosis: Secondary | ICD-10-CM | POA: Diagnosis not present

## 2018-08-09 DIAGNOSIS — Z79890 Hormone replacement therapy (postmenopausal): Secondary | ICD-10-CM | POA: Insufficient documentation

## 2018-08-09 DIAGNOSIS — I700 Atherosclerosis of aorta: Secondary | ICD-10-CM | POA: Diagnosis not present

## 2018-08-09 DIAGNOSIS — E1169 Type 2 diabetes mellitus with other specified complication: Secondary | ICD-10-CM | POA: Diagnosis not present

## 2018-08-09 DIAGNOSIS — Z885 Allergy status to narcotic agent status: Secondary | ICD-10-CM | POA: Insufficient documentation

## 2018-08-09 DIAGNOSIS — I951 Orthostatic hypotension: Secondary | ICD-10-CM | POA: Diagnosis not present

## 2018-08-09 DIAGNOSIS — Z6841 Body Mass Index (BMI) 40.0 and over, adult: Secondary | ICD-10-CM | POA: Insufficient documentation

## 2018-08-09 DIAGNOSIS — Z794 Long term (current) use of insulin: Secondary | ICD-10-CM | POA: Insufficient documentation

## 2018-08-09 DIAGNOSIS — E876 Hypokalemia: Secondary | ICD-10-CM | POA: Diagnosis present

## 2018-08-09 DIAGNOSIS — R9431 Abnormal electrocardiogram [ECG] [EKG]: Secondary | ICD-10-CM | POA: Insufficient documentation

## 2018-08-09 DIAGNOSIS — Z880 Allergy status to penicillin: Secondary | ICD-10-CM | POA: Insufficient documentation

## 2018-08-09 DIAGNOSIS — Z87891 Personal history of nicotine dependence: Secondary | ICD-10-CM | POA: Insufficient documentation

## 2018-08-09 DIAGNOSIS — Z79899 Other long term (current) drug therapy: Secondary | ICD-10-CM | POA: Insufficient documentation

## 2018-08-09 DIAGNOSIS — R110 Nausea: Secondary | ICD-10-CM | POA: Diagnosis present

## 2018-08-09 DIAGNOSIS — N179 Acute kidney failure, unspecified: Secondary | ICD-10-CM

## 2018-08-09 DIAGNOSIS — R911 Solitary pulmonary nodule: Secondary | ICD-10-CM | POA: Insufficient documentation

## 2018-08-09 DIAGNOSIS — E039 Hypothyroidism, unspecified: Secondary | ICD-10-CM

## 2018-08-09 DIAGNOSIS — R112 Nausea with vomiting, unspecified: Secondary | ICD-10-CM | POA: Diagnosis not present

## 2018-08-09 DIAGNOSIS — E669 Obesity, unspecified: Secondary | ICD-10-CM

## 2018-08-09 DIAGNOSIS — R7989 Other specified abnormal findings of blood chemistry: Secondary | ICD-10-CM | POA: Diagnosis present

## 2018-08-09 DIAGNOSIS — K210 Gastro-esophageal reflux disease with esophagitis: Secondary | ICD-10-CM | POA: Diagnosis not present

## 2018-08-09 DIAGNOSIS — F319 Bipolar disorder, unspecified: Secondary | ICD-10-CM

## 2018-08-09 DIAGNOSIS — E119 Type 2 diabetes mellitus without complications: Secondary | ICD-10-CM | POA: Insufficient documentation

## 2018-08-09 DIAGNOSIS — D72829 Elevated white blood cell count, unspecified: Secondary | ICD-10-CM

## 2018-08-09 DIAGNOSIS — A0472 Enterocolitis due to Clostridium difficile, not specified as recurrent: Secondary | ICD-10-CM | POA: Diagnosis not present

## 2018-08-09 DIAGNOSIS — I959 Hypotension, unspecified: Secondary | ICD-10-CM | POA: Diagnosis present

## 2018-08-09 DIAGNOSIS — D1809 Hemangioma of other sites: Secondary | ICD-10-CM | POA: Diagnosis not present

## 2018-08-09 DIAGNOSIS — E785 Hyperlipidemia, unspecified: Secondary | ICD-10-CM | POA: Diagnosis present

## 2018-08-09 DIAGNOSIS — K209 Esophagitis, unspecified without bleeding: Secondary | ICD-10-CM | POA: Diagnosis present

## 2018-08-09 DIAGNOSIS — Z7982 Long term (current) use of aspirin: Secondary | ICD-10-CM | POA: Insufficient documentation

## 2018-08-09 DIAGNOSIS — R2689 Other abnormalities of gait and mobility: Secondary | ICD-10-CM | POA: Insufficient documentation

## 2018-08-09 DIAGNOSIS — I10 Essential (primary) hypertension: Secondary | ICD-10-CM | POA: Diagnosis not present

## 2018-08-09 DIAGNOSIS — R778 Other specified abnormalities of plasma proteins: Secondary | ICD-10-CM | POA: Diagnosis present

## 2018-08-09 DIAGNOSIS — Z8249 Family history of ischemic heart disease and other diseases of the circulatory system: Secondary | ICD-10-CM | POA: Insufficient documentation

## 2018-08-09 DIAGNOSIS — Z881 Allergy status to other antibiotic agents status: Secondary | ICD-10-CM | POA: Insufficient documentation

## 2018-08-09 HISTORY — DX: Nausea: R11.0

## 2018-08-09 LAB — CBC WITH DIFFERENTIAL/PLATELET
Abs Immature Granulocytes: 0.13 10*3/uL — ABNORMAL HIGH (ref 0.00–0.07)
Basophils Absolute: 0.1 10*3/uL (ref 0.0–0.1)
Basophils Relative: 1 %
Eosinophils Absolute: 0.6 10*3/uL — ABNORMAL HIGH (ref 0.0–0.5)
Eosinophils Relative: 3 %
HCT: 42.4 % (ref 36.0–46.0)
Hemoglobin: 13.4 g/dL (ref 12.0–15.0)
Immature Granulocytes: 1 %
Lymphocytes Relative: 30 %
Lymphs Abs: 6.1 10*3/uL — ABNORMAL HIGH (ref 0.7–4.0)
MCH: 20.6 pg — ABNORMAL LOW (ref 26.0–34.0)
MCHC: 31.6 g/dL (ref 30.0–36.0)
MCV: 65.1 fL — AB (ref 80.0–100.0)
Monocytes Absolute: 1.3 10*3/uL — ABNORMAL HIGH (ref 0.1–1.0)
Monocytes Relative: 7 %
NEUTROS PCT: 58 %
Neutro Abs: 11.8 10*3/uL — ABNORMAL HIGH (ref 1.7–7.7)
Platelets: 420 10*3/uL — ABNORMAL HIGH (ref 150–400)
RBC: 6.51 MIL/uL — ABNORMAL HIGH (ref 3.87–5.11)
RDW: 17.2 % — ABNORMAL HIGH (ref 11.5–15.5)
WBC: 20.1 10*3/uL — ABNORMAL HIGH (ref 4.0–10.5)
nRBC: 0.1 % (ref 0.0–0.2)

## 2018-08-09 LAB — I-STAT CG4 LACTIC ACID, ED
LACTIC ACID, VENOUS: 2.95 mmol/L — AB (ref 0.5–1.9)
LACTIC ACID, VENOUS: 4.32 mmol/L — AB (ref 0.5–1.9)

## 2018-08-09 LAB — COMPREHENSIVE METABOLIC PANEL
ALT: 64 U/L — ABNORMAL HIGH (ref 0–44)
AST: 76 U/L — ABNORMAL HIGH (ref 15–41)
Albumin: 3.5 g/dL (ref 3.5–5.0)
Alkaline Phosphatase: 59 U/L (ref 38–126)
Anion gap: 12 (ref 5–15)
BILIRUBIN TOTAL: 0.8 mg/dL (ref 0.3–1.2)
BUN: 18 mg/dL (ref 6–20)
CO2: 27 mmol/L (ref 22–32)
Calcium: 9.2 mg/dL (ref 8.9–10.3)
Chloride: 97 mmol/L — ABNORMAL LOW (ref 98–111)
Creatinine, Ser: 2.18 mg/dL — ABNORMAL HIGH (ref 0.44–1.00)
GFR calc Af Amer: 29 mL/min — ABNORMAL LOW (ref >60)
GFR calc non Af Amer: 25 mL/min — ABNORMAL LOW (ref >60)
Glucose, Bld: 57 mg/dL — ABNORMAL LOW (ref 70–99)
Potassium: 2.5 mmol/L — CL (ref 3.5–5.1)
Sodium: 136 mmol/L (ref 135–145)
TOTAL PROTEIN: 6.5 g/dL (ref 6.5–8.1)

## 2018-08-09 LAB — MAGNESIUM: Magnesium: 1.9 mg/dL (ref 1.7–2.4)

## 2018-08-09 LAB — I-STAT TROPONIN, ED: Troponin i, poc: 0.1 ng/mL (ref 0.00–0.08)

## 2018-08-09 LAB — LIPASE, BLOOD: Lipase: 24 U/L (ref 11–51)

## 2018-08-09 MED ORDER — ENOXAPARIN SODIUM 40 MG/0.4ML ~~LOC~~ SOLN: 40.0000 mg | SUBCUTANEOUS | Status: DC

## 2018-08-09 MED ORDER — INSULIN ASPART 100 UNIT/ML ~~LOC~~ SOLN: 0.0000 [IU] | Freq: Every day | SUBCUTANEOUS | Status: DC

## 2018-08-09 MED ORDER — SODIUM CHLORIDE 0.9 % IV BOLUS
1000.0000 mL | Freq: Once | INTRAVENOUS | Status: AC
  Administered 2018-08-09: 1000 mL via INTRAVENOUS

## 2018-08-09 MED ORDER — ONDANSETRON HCL 4 MG PO TABS: 4.0000 mg | ORAL_TABLET | Freq: Four times a day (QID) | ORAL | Status: DC | PRN

## 2018-08-09 MED ORDER — VANCOMYCIN HCL IN DEXTROSE 1-5 GM/200ML-% IV SOLN
1000.0000 mg | Freq: Once | INTRAVENOUS | Status: AC
  Administered 2018-08-09: 1000 mg via INTRAVENOUS
  Filled 2018-08-09: qty 200

## 2018-08-09 MED ORDER — ACETAMINOPHEN 325 MG PO TABS: 650.0000 mg | ORAL_TABLET | Freq: Four times a day (QID) | ORAL | Status: DC | PRN

## 2018-08-09 MED ORDER — PANTOPRAZOLE SODIUM 40 MG IV SOLR
40.0000 mg | Freq: Two times a day (BID) | INTRAVENOUS | Status: DC
  Administered 2018-08-10 – 2018-08-11 (×3): 40 mg via INTRAVENOUS
  Filled 2018-08-09 (×3): qty 40

## 2018-08-09 MED ORDER — LEVOTHYROXINE SODIUM 75 MCG PO TABS
150.0000 ug | ORAL_TABLET | Freq: Every day | ORAL | Status: DC
  Administered 2018-08-10 – 2018-08-11 (×2): 150 ug via ORAL
  Filled 2018-08-09 (×2): qty 2

## 2018-08-09 MED ORDER — NITROGLYCERIN 0.4 MG SL SUBL: 0.4000 mg | SUBLINGUAL_TABLET | SUBLINGUAL | Status: DC | PRN

## 2018-08-09 MED ORDER — ONDANSETRON HCL 4 MG/2ML IJ SOLN: 4.0000 mg | Freq: Four times a day (QID) | INTRAMUSCULAR | Status: DC | PRN

## 2018-08-09 MED ORDER — SODIUM CHLORIDE 0.9 % IV SOLN
1.0000 g | Freq: Three times a day (TID) | INTRAVENOUS | Status: DC
  Administered 2018-08-09: 1 g via INTRAVENOUS
  Filled 2018-08-09 (×3): qty 1

## 2018-08-09 MED ORDER — ASPIRIN EC 81 MG PO TBEC
81.0000 mg | DELAYED_RELEASE_TABLET | Freq: Every day | ORAL | Status: DC
  Administered 2018-08-10 – 2018-08-11 (×2): 81 mg via ORAL
  Filled 2018-08-09 (×2): qty 1

## 2018-08-09 MED ORDER — POTASSIUM CHLORIDE IN NACL 40-0.9 MEQ/L-% IV SOLN
INTRAVENOUS | Status: DC
  Administered 2018-08-10 (×2): 150 mL/h via INTRAVENOUS
  Filled 2018-08-09 (×2): qty 1000

## 2018-08-09 MED ORDER — INSULIN ASPART 100 UNIT/ML ~~LOC~~ SOLN
0.0000 [IU] | Freq: Three times a day (TID) | SUBCUTANEOUS | Status: DC
  Administered 2018-08-10: 3 [IU] via SUBCUTANEOUS
  Administered 2018-08-10: 8 [IU] via SUBCUTANEOUS

## 2018-08-09 MED ORDER — LURASIDONE HCL 20 MG PO TABS
60.0000 mg | ORAL_TABLET | Freq: Every day | ORAL | Status: DC
  Administered 2018-08-10 – 2018-08-11 (×2): 60 mg via ORAL
  Filled 2018-08-09 (×3): qty 3

## 2018-08-09 MED ORDER — POTASSIUM CHLORIDE 10 MEQ/100ML IV SOLN
10.0000 meq | INTRAVENOUS | Status: AC
  Administered 2018-08-09 – 2018-08-10 (×4): 10 meq via INTRAVENOUS
  Filled 2018-08-09 (×3): qty 100

## 2018-08-09 MED ORDER — BUTALBITAL-APAP-CAFFEINE 50-325-40 MG PO TABS: 1.0000 | ORAL_TABLET | Freq: Four times a day (QID) | ORAL | Status: DC | PRN

## 2018-08-09 MED ORDER — POTASSIUM CHLORIDE CRYS ER 20 MEQ PO TBCR
40.0000 meq | EXTENDED_RELEASE_TABLET | Freq: Once | ORAL | Status: AC
  Administered 2018-08-09: 40 meq via ORAL
  Filled 2018-08-09: qty 2

## 2018-08-09 MED ORDER — ACETAMINOPHEN 650 MG RE SUPP: 650.0000 mg | Freq: Four times a day (QID) | RECTAL | Status: DC | PRN

## 2018-08-09 NOTE — ED Notes (Signed)
I-Stat Lactic Acid results of 2.95 reported to Dr. Darl Householder.

## 2018-08-09 NOTE — ED Triage Notes (Signed)
[facility] reports pt has vomitting, dizziness, poor balance and hypotension beginning yesterday with falls. Home bp < 763 systolic EMS reports 94/32, 76/54, 90/50  88 standing. Hr 106 97% RA, 113 cbg

## 2018-08-09 NOTE — ED Notes (Signed)
Patient returned from CT

## 2018-08-09 NOTE — ED Notes (Signed)
Patient transported to CT 

## 2018-08-09 NOTE — ED Provider Notes (Signed)
[facility] EMERGENCY DEPARTMENT Provider Note   CSN: [id number] Arrival date & time: 08/09/18  1855     History   Chief Complaint Chief Complaint  Patient presents with  . Dizziness    HPI Morgan [name] is a 53 y.o. female history of diabetes, hypertension, here presenting with vomiting, weakness, hypotension.  Patient was recently admitted for severe esophagitis and had endoscopy that confirmed it.  Patient was discharged home with Carafate and Pepcid and Protonix.  States that she stopped taking Carafate several days ago.  She then started having vomiting for the last several days.  She denies any abdominal pain.  She states that today she feels lightheaded and dizzy and has some subjective chills but no fever.  She denies any cough. She was noted to be hypotensive by EMS.   The history is provided by the patient.    Past Medical History:  Diagnosis Date  . Anxiety   . Asthma   . Bipolar depression ([place])   . Chronic bronchitis ([place])   . Chronic esophagogastric ulcer   . Chronic stomach ulcer   . Fibroid   . GERD (gastroesophageal reflux disease)   . Heart murmur   . Hepatic steatosis   . History of blood transfusion 2010   "related to subclavian stent"  . History of hiatal hernia   . Hyperlipidemia   . Hypertension   . Hypothyroid   . Migraine    "cerebral migraines; 1-2/month" (06/29/2018)  . 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 Stroke [facility])    "I've had 2; most recent one was ~ 2015 or before; affected balance" (06/29/2018)  . Thalassemia minor   . Type II diabetes mellitus ([place])   . Vitamin D deficiency     Patient Active Problem List   Diagnosis Date Noted  . Esophagitis determined by endoscopy   . Dysphagia 07/24/2018  . Odynophagia 07/23/2018  . Orthostatic hypotension 07/22/2018  . Tachycardia 07/22/2018  . Hepatic steatosis   . Elevated transaminase level 07/21/2018  . Mild renal insufficiency 07/21/2018  . Leukocytosis  07/21/2018  . Bipolar depression ([place]) 07/21/2018  . Incidental lung nodule 07/21/2018  . Extravasation accident, initial encounter 07/21/2018  . Prolonged QT interval 07/21/2018  . LFTs abnormal   . Sepsis ([place]) 06/27/2018  . Hypokalemia 06/27/2018  . Diabetes mellitus type 2 in obese ([place]) 06/27/2018  . Chest pain, rule out acute myocardial infarction 05/14/2016  . Chest pain 05/14/2016  . Hyperlipidemia 05/14/2016  . Hypothyroidism 05/14/2016  . Essential hypertension 05/14/2016    Past Surgical History:  Procedure Laterality Date  . ABDOMINAL HYSTERECTOMY  2015   "still have 1 ovary"  . BIOPSY  07/25/2018   Procedure: BIOPSY;  Surgeon: Thornton Park, MD;  Location: [facility] ENDOSCOPY;  Service: Gastroenterology;;  . [place]; 1996  . ESOPHAGOGASTRODUODENOSCOPY (EGD) WITH PROPOFOL N/A 07/25/2018   Procedure: ESOPHAGOGASTRODUODENOSCOPY (EGD) WITH PROPOFOL;  Surgeon: Thornton Park, MD;  Location: [place];  Service: Gastroenterology;  Laterality: N/A;  . KNEE RECONSTRUCTION Left 1980  . LAPAROSCOPIC CHOLECYSTECTOMY    . SUBCLAVIAN STENT PLACEMENT Left 2010  . TONSILLECTOMY  1970     OB History    Gravida  6   Para  2   Term  2   Preterm      AB  4   Living  2     SAB  4   TAB      Ectopic  Multiple      Live Births               Home Medications    Prior to Admission medications   Medication Sig Start Date End Date Taking? Authorizing Provider  aspirin 81 MG tablet Take 81 mg by mouth daily.     [provider]  Blood Glucose Monitoring Suppl (CONTOUR NEXT ONE) KIT 1 kit by Does not apply route as directed. 07/02/18   Patrecia Pour, MD  butalbital-acetaminophen-caffeine (FIORICET, ESGIC) [phone] MG tablet Take 1-2 tablets by mouth every 6 (six) hours as needed for headache or migraine. 07/26/18 07/26/19  Rai, Ripudeep Raliegh Ip, MD  Cholecalciferol (VITAMIN D3) 50 MCG (2000 UT) TABS Take 2,000-4,000 Units by mouth See  admin instructions. Take 4,000 units by mouth once a day on Mon/Wed/Fri and 2,000 units on Sun/Tues/Thurs/Sat    [provider]  Co-Enzyme Q-10 30 MG CAPS Take 30 mg by mouth daily.     [provider]  doxepin (SINEQUAN) 25 MG capsule Take 50 mg by mouth at bedtime.     [provider]  EPINEPHrine 0.3 mg/0.3 mL IJ SOAJ injection Inject 0.3 mg into the muscle once as needed (for an anaphylactic reaction).  02/08/18   [provider]  famotidine (PEPCID) 20 MG tablet Take 1 tablet (20 mg total) by mouth 2 (two) times daily. 07/26/18   Rai, Ripudeep Raliegh Ip, MD  FLUoxetine (PROZAC) 20 MG tablet Take 60 mg by mouth every morning.     [provider]  GLUCAGON EMERGENCY 1 MG injection Inject 1 mg into the vein once as needed (FOR ONSET OF HYPOGLYCEMIA).  02/09/18   [provider]  Insulin Human (INSULIN PUMP) SOLN Inject into the skin continuous. Humalog    [provider]  levothyroxine (SYNTHROID, LEVOTHROID) 150 MCG tablet Take 150 mcg by mouth daily.     [provider]  losartan (COZAAR) 100 MG tablet Take 100 mg by mouth daily.    [provider]  Lurasidone HCl (LATUDA) 60 MG TABS Take 60 mg by mouth daily.    [provider]  Multiple Vitamin (MULTIVITAMIN WITH MINERALS) TABS tablet Take 1 tablet by mouth daily.    [provider]  nitroGLYCERIN (NITROSTAT) 0.4 MG SL tablet Place 1 tablet (0.4 mg total) under the tongue every 5 (five) minutes x 3 doses as needed for chest pain. 07/26/18   Rai, Vernelle Emerald, MD  pantoprazole (PROTONIX) 40 MG tablet Take 1 tablet (40 mg total) by mouth 2 (two) times daily before a meal. X 8 weeks 07/26/18   Rai, Ripudeep K, MD  QUEtiapine (SEROQUEL) 50 MG tablet Take 150 mg by mouth at bedtime.    [provider]  Semaglutide (OZEMPIC, 0.25 OR 0.5 MG/DOSE, [place]) Inject 0.5 mg into the skin every Monday.     [provider]  sucralfate (CARAFATE) 1 g tablet  Take 1 tablet (1 g total) by mouth 4 (four) times daily -  with meals and at bedtime. X 2 weeks 07/26/18 09/24/18  Mendel Corning, MD    Family History Family History  Problem Relation Age of Onset  . Diabetes Mother   . Hypertension Mother   . Hyperlipidemia Mother   . CAD Mother   . CAD Father   . Heart failure Father   . Hypertension Sister   . Hyperlipidemia Sister   . Hypertension Brother   . Hyperlipidemia Brother     Social History Social  History   Tobacco Use  . Smoking status: Former Smoker    Packs/day: 2.00    Years: 36.00    Pack years: 72.00    Types: Cigarettes    Last attempt to quit: 05/14/2014    Years since quitting: 4.2  . Smokeless tobacco: Never Used  Substance Use Topics  . Alcohol use: Not Currently  . Drug use: Not Currently     Allergies   Ilosone [erythromycin]; Keflex [cephalexin]; Penicillins; Iron; Metformin and related; and Morphine and related   Review of Systems Review of Systems  Gastrointestinal: Positive for vomiting.  Neurological: Positive for dizziness.  All other systems reviewed and are negative.    Physical Exam Updated Vital Signs BP (!) 86/56 (BP Location: Right Arm)   Pulse 98   Temp 98.6 F (37 C) (Oral)   Resp 16   Ht 5' 2.5" (1.588 m)   Wt 113.4 kg   LMP 08/03/2013   SpO2 93%   BMI 45.00 kg/m   Physical Exam Vitals signs and nursing note reviewed.  HENT:     Head: Normocephalic.     Nose: Nose normal.     Mouth/Throat:     Comments: MM dry  Eyes:     Extraocular Movements: Extraocular movements intact.     Pupils: Pupils are equal, round, and reactive to light.  Neck:     Musculoskeletal: Normal range of motion.  Cardiovascular:     Rate and Rhythm: Normal rate and regular rhythm.     Pulses: Normal pulses.     Heart sounds: Normal heart sounds.  Pulmonary:     Effort: Pulmonary effort is normal.     Breath sounds: Normal breath sounds.  Abdominal:     General: Abdomen is flat.      Palpations: Abdomen is soft.     Comments: + epigastric tenderness   Musculoskeletal: Normal range of motion.  Skin:    General: Skin is warm.     Capillary Refill: Capillary refill takes less than 2 seconds.  Neurological:     General: No focal deficit present.     Mental Status: She is alert and oriented to person, place, and time.  Psychiatric:        Mood and Affect: Mood normal.        Behavior: Behavior normal.      ED Treatments / Results  Labs (all labs ordered are listed, but only abnormal results are displayed) Labs Reviewed  CBC WITH DIFFERENTIAL/PLATELET - Abnormal; Notable for the following components:      Result Value   WBC 20.1 (*)    RBC 6.51 (*)    MCV 65.1 (*)    MCH 20.6 (*)    RDW 17.2 (*)    Platelets 420 (*)    Neutro Abs 11.8 (*)    Lymphs Abs 6.1 (*)    Monocytes Absolute 1.3 (*)    Eosinophils Absolute 0.6 (*)    Abs Immature Granulocytes 0.13 (*)    All other components within normal limits  COMPREHENSIVE METABOLIC PANEL - Abnormal; Notable for the following components:   Potassium 2.5 (*)    Chloride 97 (*)    Glucose, Bld 57 (*)    Creatinine, Ser 2.18 (*)    AST 76 (*)    ALT 64 (*)    GFR calc non Af Amer 25 (*)    GFR calc Af Amer 29 (*)    All other components within normal limits  I-STAT  CG4 LACTIC ACID, ED - Abnormal; Notable for the following components:   Lactic Acid, Venous 2.95 (*)    All other components within normal limits  I-STAT TROPONIN, ED - Abnormal; Notable for the following components:   Troponin i, poc 0.10 (*)    All other components within normal limits  URINE CULTURE  CULTURE, BLOOD (ROUTINE X 2)  CULTURE, BLOOD (ROUTINE X 2)  LIPASE, BLOOD  URINALYSIS, ROUTINE W REFLEX MICROSCOPIC  MAGNESIUM  I-STAT CG4 LACTIC ACID, ED    EKG EKG Interpretation  Date/Time:  Wednesday August 09 2018 19:05:33 EST Ventricular Rate:  100 PR Interval:    QRS Duration: 92 QT Interval:  402 QTC Calculation: 519 R  Axis:   62 Text Interpretation:  Sinus tachycardia Minimal ST depression, inferior leads Prolonged QT interval Baseline wander in lead(s) II III aVF V1 V5 No significant change since last tracing Confirmed by Wandra Arthurs [phone]) on 08/09/2018 7:27:43 PM   Radiology Ct Abdomen Pelvis Wo Contrast  Result Date: 08/09/2018 CLINICAL DATA:  Vomiting, dizziness and poor balance with hypotension beginning yesterday. Acute renal injury. EXAM: CT CHEST, ABDOMEN AND PELVIS WITHOUT CONTRAST TECHNIQUE: Multidetector CT imaging of the chest, abdomen and pelvis was performed following the standard protocol without IV contrast. COMPARISON:  CT abdomen and pelvis 08/03/2013, process of the abdomen 07/22/2018 FINDINGS: CT CHEST FINDINGS Cardiovascular: Normal heart size with coronary arteriosclerosis along the LAD and left circumflex. No pericardial effusion or thickening. Atherosclerosis of the thoracic aorta without aneurysm. Left subclavian artery stent at its origin. Minimal atherosclerosis is otherwise noted of the great vessels with conventional branch pattern. The unenhanced pulmonary vasculature is unremarkable without significant dilatation of the main pulmonary artery. Mediastinum/Nodes: No enlarged mediastinal, hilar, or axillary lymph nodes. Thyroid gland, trachea, and esophagus demonstrate no significant findings. Lungs/Pleura: 5.7 mm in average ovoid nodular density in the anterior right lower lobe adjacent to the major fissure. Bibasilar dependent atelectasis. No pneumothorax or edema. Musculoskeletal: Vertebral body hemangioma of T2. No acute nor suspicious osseous abnormality. CT ABDOMEN PELVIS FINDINGS Hepatobiliary: No focal liver abnormality is seen. Status post cholecystectomy. No biliary dilatation. Pancreas: Unremarkable. No pancreatic ductal dilatation or surrounding inflammatory changes. Spleen: Normal in size without focal abnormality. Adrenals/Urinary Tract: Adrenal glands are unremarkable. Kidneys are  normal, without renal calculi, focal lesion, or hydronephrosis. Bladder is unremarkable. Stomach/Bowel: Small hiatal hernia. Physiologic distention of the stomach with normal duodenal sweep and ligament of Treitz position. No small bowel dilatation or inflammation. No obstruction is noted. The distal and terminal ileum are normal. Normal appearing appendix. Vascular/Lymphatic: Aortoiliac atherosclerosis without aneurysm. No lymphadenopathy by CT size criteria. Reproductive: Status post hysterectomy. No adnexal masses. Other: No abdominal wall hernia or abnormality. No abdominopelvic ascites. Musculoskeletal: No fracture is seen. IMPRESSION: CT chest: 1. 5.7 mm in average ovoid nodular density in the right lower lobe adjacent to the major fissure. Non-contrast chest CT at 6-12 months is recommended. If the nodule is stable at time of repeat CT, then future CT at 18-24 months (from today's scan) is considered optional for low-risk patients, but is recommended for high-risk patients. This recommendation follows the consensus statement: Guidelines for Management of Incidental Pulmonary Nodules Detected on CT Images: From the Fleischner Society 2017; Radiology 2017; 284:228-243. 2. Coronary arteriosclerosis and aortic atherosclerosis. No aneurysm. CT AP: 1. No acute bowel inflammation or obstruction. 2. Aortoiliac atherosclerosis without aneurysm. 3. Vertebral body hemangioma of T2. 4. Hysterectomy and cholecystectomy. Aortic Atherosclerosis (ICD10-I70.0). Electronically Signed   By: Shanon Brow  Randel Pigg M.D.   On: 08/09/2018 21:11   Ct Chest Wo Contrast  Result Date: 08/09/2018 CLINICAL DATA:  Vomiting, dizziness and poor balance with hypotension beginning yesterday. Acute renal injury. EXAM: CT CHEST, ABDOMEN AND PELVIS WITHOUT CONTRAST TECHNIQUE: Multidetector CT imaging of the chest, abdomen and pelvis was performed following the standard protocol without IV contrast. COMPARISON:  CT abdomen and pelvis 08/03/2013, process  of the abdomen 07/22/2018 FINDINGS: CT CHEST FINDINGS Cardiovascular: Normal heart size with coronary arteriosclerosis along the LAD and left circumflex. No pericardial effusion or thickening. Atherosclerosis of the thoracic aorta without aneurysm. Left subclavian artery stent at its origin. Minimal atherosclerosis is otherwise noted of the great vessels with conventional branch pattern. The unenhanced pulmonary vasculature is unremarkable without significant dilatation of the main pulmonary artery. Mediastinum/Nodes: No enlarged mediastinal, hilar, or axillary lymph nodes. Thyroid gland, trachea, and esophagus demonstrate no significant findings. Lungs/Pleura: 5.7 mm in average ovoid nodular density in the anterior right lower lobe adjacent to the major fissure. Bibasilar dependent atelectasis. No pneumothorax or edema. Musculoskeletal: Vertebral body hemangioma of T2. No acute nor suspicious osseous abnormality. CT ABDOMEN PELVIS FINDINGS Hepatobiliary: No focal liver abnormality is seen. Status post cholecystectomy. No biliary dilatation. Pancreas: Unremarkable. No pancreatic ductal dilatation or surrounding inflammatory changes. Spleen: Normal in size without focal abnormality. Adrenals/Urinary Tract: Adrenal glands are unremarkable. Kidneys are normal, without renal calculi, focal lesion, or hydronephrosis. Bladder is unremarkable. Stomach/Bowel: Small hiatal hernia. Physiologic distention of the stomach with normal duodenal sweep and ligament of Treitz position. No small bowel dilatation or inflammation. No obstruction is noted. The distal and terminal ileum are normal. Normal appearing appendix. Vascular/Lymphatic: Aortoiliac atherosclerosis without aneurysm. No lymphadenopathy by CT size criteria. Reproductive: Status post hysterectomy. No adnexal masses. Other: No abdominal wall hernia or abnormality. No abdominopelvic ascites. Musculoskeletal: No fracture is seen. IMPRESSION: CT chest: 1. 5.7 mm in average  ovoid nodular density in the right lower lobe adjacent to the major fissure. Non-contrast chest CT at 6-12 months is recommended. If the nodule is stable at time of repeat CT, then future CT at 18-24 months (from today's scan) is considered optional for low-risk patients, but is recommended for high-risk patients. This recommendation follows the consensus statement: Guidelines for Management of Incidental Pulmonary Nodules Detected on CT Images: From the Fleischner Society 2017; Radiology 2017; 284:228-243. 2. Coronary arteriosclerosis and aortic atherosclerosis. No aneurysm. CT AP: 1. No acute bowel inflammation or obstruction. 2. Aortoiliac atherosclerosis without aneurysm. 3. Vertebral body hemangioma of T2. 4. Hysterectomy and cholecystectomy. Aortic Atherosclerosis (ICD10-I70.0). Electronically Signed   By: Ashley Royalty M.D.   On: 08/09/2018 21:11   Dg Chest Port 1 View  Result Date: 08/09/2018 CLINICAL DATA:  Dizziness EXAM: PORTABLE CHEST 1 VIEW COMPARISON:  07/21/2018 FINDINGS: Left subclavian artery stent unchanged. Mild atherosclerotic calcification of the aortic arch. Heart size within normal limits. The lungs appear clear. IMPRESSION: 1.  No active cardiopulmonary disease is radiographically apparent. 2. Left subclavian artery stent. 3.  Aortic Atherosclerosis (ICD10-I70.0). Electronically Signed   By: Van Clines M.D.   On: 08/09/2018 19:44    Procedures Procedures (including critical care time)  Angiocath insertion Performed by: Wandra Arthurs  Consent: Verbal consent obtained. Risks and benefits: risks, benefits and alternatives were discussed Time out: Immediately prior to procedure a "time out" was called to verify the correct patient, procedure, equipment, support staff and site/side marked as required.  Preparation: Patient was prepped and draped in the usual sterile fashion.  Vein Location:  R antecube  Ultrasound Guided  Gauge: 20 long   Normal blood return and flush  without difficulty Patient tolerance: Patient tolerated the procedure well with no immediate complications.  CRITICAL CARE Performed by: Wandra Arthurs   Total critical care time: 30 minutes  Critical care time was exclusive of separately billable procedures and treating other patients.  Critical care was necessary to treat or prevent imminent or life-threatening deterioration.  Critical care was time spent personally by me on the following activities: development of treatment plan with patient and/or surrogate as well as nursing, discussions with consultants, evaluation of patient's response to treatment, examination of patient, obtaining history from patient or surrogate, ordering and performing treatments and interventions, ordering and review of laboratory studies, ordering and review of radiographic studies, pulse oximetry and re-evaluation of patient's condition.    Medications Ordered in ED Medications  vancomycin (VANCOCIN) IVPB 1000 mg/200 mL premix (has no administration in time range)  meropenem (MERREM) 1 g in sodium chloride 0.9 % 100 mL IVPB (1 g Intravenous New Bag/Given 08/09/18 2113)  potassium chloride 10 mEq in 100 mL IVPB (has no administration in time range)  sodium chloride 0.9 % bolus 1,000 mL (1,000 mLs Intravenous New Bag/Given 08/09/18 2115)  sodium chloride 0.9 % bolus 1,000 mL (0 mLs Intravenous Stopped 08/09/18 2107)  potassium chloride SA (K-DUR,KLOR-CON) CR tablet 40 mEq (40 mEq Oral Given 08/09/18 2107)     Initial Impression / Assessment and Plan / ED Course  I have reviewed the triage vital signs and the nursing notes.  Pertinent labs & imaging results that were available during my care of the patient were reviewed by me and considered in my medical decision making (see chart for details).    SIA GABRIELSEN is a 53 y.o. female here with vomiting, chills, hypotension. Etiology of hypotension unclear. Consider rupture AAA vs dehydration vs sepsis. Will get  labs, lactate, CXR, UA. Will get CT ab/pel. Will give IVF and reassess.   9:25 PM Patient has AKI and WBC is 20. Lactate elevated at 2.9. Unable to get CT with contrast due to AKI and noncon CT showed no obvious AAA. Given empiric abx since she has leukocytosis, lactic acidosis. UA pending. Also K 2.5, supplemented. Will admit for possible severe sepsis, AKI, hypokalemia.    Final Clinical Impressions(s) / ED Diagnoses   Final diagnoses:  None    ED Discharge Orders    None       Drenda Freeze, MD 08/09/18 2127

## 2018-08-09 NOTE — H&P (Addendum)
History and Physical    RUTA CAPECE IEP:[id number] DOB: 09/11/1965 DOA: 08/09/2018  PCP: Bradly Bienenstock., MD   Patient coming from: Home  I have personally briefly reviewed patient's old medical records in [place]  Chief Complaint: Vomiting and dizziness  HPI: Morgan [name] is a 53 y.o. female with medical history significant of diabetes mellitus type 2, hypertension, hypothyroidism, bipolar disorder, C. difficile colitis, recent admission and discharged on 07/26/2018 for odynophagia/dysphagia secondary to grade D esophagitis swollen EGD for which patient was discharged on on Carafate along with pantoprazole and famotidine presented with vomiting for the last 2 to 3 days.  Patient started vomiting few days ago and had multiple episodes of nonbilious, nonbloody vomiting without any abdominal pain or diarrhea.  No documented fevers.  Her appetite was poor.  She stopped taking her Carafate yesterday.  She has been feeling lightheaded and dizzy as well.  She denies any cough, burning micturition, loss of consciousness, chest pain, palpitations.  ED Course: She was found to be hypotensive by the EMS.  She was given IV fluids.  Her labs indicated WBC of 20.1 with elevated creatinine of 2.18 and elevated lactate as well.  CT of the chest abdomen and pelvis showed no obvious source of infection.  Patient was given empiric antibiotics in the ED.  Hospitalist service was called to assist the patient.  Review of Systems: As per HPI otherwise 10 point systems were reviewed and were negative.  Past Medical History:  Diagnosis Date  . Anxiety   . Asthma   . Bipolar depression ([place])   . Chronic bronchitis ([place])   . Chronic esophagogastric ulcer   . Chronic stomach ulcer   . Fibroid   . GERD (gastroesophageal reflux disease)   . Heart murmur   . Hepatic steatosis   . History of blood transfusion 2010   "related to subclavian stent"  . History of hiatal hernia   .  Hyperlipidemia   . Hypertension   . Hypothyroid   . Migraine    "cerebral migraines; 1-2/month" (06/29/2018)  . 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 Stroke [facility])    "I've had 2; most recent one was ~ 2015 or before; affected balance" (06/29/2018)  . Thalassemia minor   . Type II diabetes mellitus ([place])   . Vitamin D deficiency     Past Surgical History:  Procedure Laterality Date  . ABDOMINAL HYSTERECTOMY  2015   "still have 1 ovary"  . BIOPSY  07/25/2018   Procedure: BIOPSY;  Surgeon: Thornton Park, MD;  Location: [facility] ENDOSCOPY;  Service: Gastroenterology;;  . [place]; 1996  . ESOPHAGOGASTRODUODENOSCOPY (EGD) WITH PROPOFOL N/A 07/25/2018   Procedure: ESOPHAGOGASTRODUODENOSCOPY (EGD) WITH PROPOFOL;  Surgeon: Thornton Park, MD;  Location: [place];  Service: Gastroenterology;  Laterality: N/A;  . KNEE RECONSTRUCTION Left 1980  . LAPAROSCOPIC CHOLECYSTECTOMY    . SUBCLAVIAN STENT PLACEMENT Left 2010  . TONSILLECTOMY  1970     reports that she quit smoking about 4 years ago. Her smoking use included cigarettes. She has a 72.00 pack-year smoking history. She has never used smokeless tobacco. She reports previous alcohol use. She reports previous drug use.  Allergies  Allergen Reactions  . Ilosone [Erythromycin] Anaphylaxis  . Keflex [Cephalexin] Anaphylaxis  . Penicillins Anaphylaxis    Has patient had a PCN reaction causing immediate rash, facial/tongue/throat swelling, SOB or lightheadedness with hypotension: YES Has patient had a PCN reaction causing severe rash involving mucus  membranes or skin necrosis: NO Has patient had a PCN reaction that required hospitalizationNO Has patient had a PCN reaction occurring within the last 10 years: NO If all of the above answers are "NO", then may proceed with Cephalosporin use.  . Iron Other (See Comments)    Pt reports condition limiting Iron intake.  . Metformin And Related Other (See Comments)    Pt  prefers to not take this medication  . Morphine And Related Other (See Comments)    Pt reports hallucinations    Family History  Problem Relation Age of Onset  . Diabetes Mother   . Hypertension Mother   . Hyperlipidemia Mother   . CAD Mother   . CAD Father   . Heart failure Father   . Hypertension Sister   . Hyperlipidemia Sister   . Hypertension Brother   . Hyperlipidemia Brother     Prior to Admission medications   Medication Sig Start Date End Date Taking? Authorizing Provider  aspirin 81 MG tablet Take 81 mg by mouth daily.     [provider]  Blood Glucose Monitoring Suppl (CONTOUR NEXT ONE) KIT 1 kit by Does not apply route as directed. 07/02/18   Patrecia Pour, MD  butalbital-acetaminophen-caffeine (FIORICET, ESGIC) [phone] MG tablet Take 1-2 tablets by mouth every 6 (six) hours as needed for headache or migraine. 07/26/18 07/26/19  Rai, Ripudeep Raliegh Ip, MD  Cholecalciferol (VITAMIN D3) 50 MCG (2000 UT) TABS Take 2,000-4,000 Units by mouth See admin instructions. Take 4,000 units by mouth once a day on Mon/Wed/Fri and 2,000 units on Sun/Tues/Thurs/Sat    [provider]  Co-Enzyme Q-10 30 MG CAPS Take 30 mg by mouth daily.     [provider]  doxepin (SINEQUAN) 25 MG capsule Take 50 mg by mouth at bedtime.     [provider]  EPINEPHrine 0.3 mg/0.3 mL IJ SOAJ injection Inject 0.3 mg into the muscle once as needed (for an anaphylactic reaction).  02/08/18   [provider]  famotidine (PEPCID) 20 MG tablet Take 1 tablet (20 mg total) by mouth 2 (two) times daily. 07/26/18   Rai, Ripudeep Raliegh Ip, MD  FLUoxetine (PROZAC) 20 MG tablet Take 60 mg by mouth every morning.     [provider]  GLUCAGON EMERGENCY 1 MG injection Inject 1 mg into the vein once as needed (FOR ONSET OF HYPOGLYCEMIA).  02/09/18   [provider]  Insulin Human (INSULIN PUMP) SOLN Inject into the skin continuous. Humalog    [provider]    levothyroxine (SYNTHROID, LEVOTHROID) 150 MCG tablet Take 150 mcg by mouth daily.     [provider]  losartan (COZAAR) 100 MG tablet Take 100 mg by mouth daily.    [provider]  Lurasidone HCl (LATUDA) 60 MG TABS Take 60 mg by mouth daily.    [provider]  Multiple Vitamin (MULTIVITAMIN WITH MINERALS) TABS tablet Take 1 tablet by mouth daily.    [provider]  nitroGLYCERIN (NITROSTAT) 0.4 MG SL tablet Place 1 tablet (0.4 mg total) under the tongue every 5 (five) minutes x 3 doses as needed for chest pain. 07/26/18   Rai, Vernelle Emerald, MD  pantoprazole (PROTONIX) 40 MG tablet Take 1 tablet (40 mg total) by mouth 2 (two) times daily before a meal. X 8 weeks 07/26/18   Rai, Ripudeep K, MD  QUEtiapine (SEROQUEL) 50 MG tablet Take 150 mg by mouth at bedtime.    [provider]  Semaglutide (OZEMPIC, 0.25 OR 0.5 MG/DOSE, [place]) Inject 0.5 mg into the skin every Monday.     [provider]  sucralfate (CARAFATE) 1 g tablet Take 1 tablet (1 g total) by mouth 4 (four) times daily -  with meals and at bedtime. X 2 weeks 07/26/18 09/24/18  Mendel Corning, MD    Physical Exam: Vitals:   08/09/18 2045 08/09/18 2115 08/09/18 2130 08/09/18 2145  BP: 114/69  109/60 (!) 87/67  Pulse: 96 95  95  Resp: _0 Temp:      TempSrc:      SpO2: 98% 98%  99%  Weight:      Height:        Constitutional: NAD, calm, comfortable Vitals:   08/09/18 2045 08/09/18 2115 08/09/18 2130 08/09/18 2145  BP: 114/69  109/60 (!) 87/67  Pulse: 96 95  95  Resp: _1 Temp:      TempSrc:      SpO2: 98% 98%  99%  Weight:      Height:       Eyes: PERRL, lids and conjunctivae normal ENMT: Mucous membranes are dry. Posterior pharynx clear of any exudate or lesions. Neck: normal, supple, no masses, no thyromegaly Respiratory: bilateral decreased breath sounds at bases, no wheezing, no crackles. Normal respiratory effort. No accessory muscle use.   Cardiovascular: S1 S2 positive, rate controlled. No extremity edema. 2+ pedal pulses.  Abdomen: Morbidly obese, no tenderness, no masses palpated. No hepatosplenomegaly. Bowel sounds positive.  Musculoskeletal: no clubbing / cyanosis. No joint deformity upper and lower extremities.  Skin: no rashes, lesions, ulcers. No induration Neurologic: CN 2-12 grossly intact. Moving extremities. No focal neurologic deficits.  Psychiatric: Normal judgment and insight. Alert and oriented x 3. Normal mood.   Labs on Admission: I have personally reviewed following labs and imaging studies  CBC: Recent Labs  Lab 08/09/18 1933  WBC 20.1*  NEUTROABS 11.8*  HGB 13.4  HCT 42.4  MCV 65.1*  PLT 163*   Basic Metabolic Panel: Recent Labs  Lab 08/09/18 1933 08/09/18 2100  NA 136  --   K 2.5*  --   CL 97*  --   CO2 27  --   GLUCOSE 57*  --   BUN 18  --   CREATININE 2.18*  --   CALCIUM 9.2  --   MG  --  1.9   GFR: Estimated Creatinine Clearance: 35.4 mL/min (A) (by C-G formula based on SCr of 2.18 mg/dL (H)). Liver Function Tests: Recent Labs  Lab 08/09/18 1933  AST 76*  ALT 64*  ALKPHOS 59  BILITOT 0.8  PROT 6.5  ALBUMIN 3.5   Recent Labs  Lab 08/09/18 1933  LIPASE 24   No results for input(s): AMMONIA in the last 168 hours. Coagulation Profile: No results for input(s): INR, PROTIME in the last 168 hours. Cardiac Enzymes: No results for input(s): CKTOTAL, CKMB, CKMBINDEX, TROPONINI in the last 168 hours. BNP (last 3 results) No results for input(s): PROBNP in the last 8760 hours. HbA1C: No results for input(s): HGBA1C in the last 72 hours. CBG: No results for input(s): GLUCAP in the last 168 hours. Lipid Profile: No results for input(s): CHOL, HDL, LDLCALC, TRIG, CHOLHDL, LDLDIRECT in the last 72 hours. Thyroid Function Tests: No results for input(s): TSH, T4TOTAL, FREET4, T3FREE, THYROIDAB in the last 72 hours. Anemia Panel: No results for input(s): VITAMINB12, FOLATE,  FERRITIN, TIBC, IRON, RETICCTPCT in the last 72 hours.  Urine analysis:    Component Value Date/Time   COLORURINE YELLOW 07/21/2018 1904   APPEARANCEUR HAZY (A) 07/21/2018 1904   LABSPEC 1.039 (H) 07/21/2018 1904   PHURINE 6.0 07/21/2018 1904   GLUCOSEU 150 (A) 07/21/2018 1904   HGBUR NEGATIVE 07/21/2018 1904   BILIRUBINUR NEGATIVE 07/21/2018 1904   KETONESUR 5 (A) 07/21/2018 1904   PROTEINUR NEGATIVE 07/21/2018 1904   UROBILINOGEN 0.2 08/03/2013 2352   NITRITE NEGATIVE 07/21/2018 1904   LEUKOCYTESUR NEGATIVE 07/21/2018 1904    Radiological Exams on Admission: Ct Abdomen Pelvis Wo Contrast  Result Date: 08/09/2018 CLINICAL DATA:  Vomiting, dizziness and poor balance with hypotension beginning yesterday. Acute renal injury. EXAM: CT CHEST, ABDOMEN AND PELVIS WITHOUT CONTRAST TECHNIQUE: Multidetector CT imaging of the chest, abdomen and pelvis was performed following the standard protocol without IV contrast. COMPARISON:  CT abdomen and pelvis 08/03/2013, process of the abdomen 07/22/2018 FINDINGS: CT CHEST FINDINGS Cardiovascular: Normal heart size with coronary arteriosclerosis along the LAD and left circumflex. No pericardial effusion or thickening. Atherosclerosis of the thoracic aorta without aneurysm. Left subclavian artery stent at its origin. Minimal atherosclerosis is otherwise noted of the great vessels with conventional branch pattern. The unenhanced pulmonary vasculature is unremarkable without significant dilatation of the main pulmonary artery. Mediastinum/Nodes: No enlarged mediastinal, hilar, or axillary lymph nodes. Thyroid gland, trachea, and esophagus demonstrate no significant findings. Lungs/Pleura: 5.7 mm in average ovoid nodular density in the anterior right lower lobe adjacent to the major fissure. Bibasilar dependent atelectasis. No pneumothorax or edema. Musculoskeletal: Vertebral body hemangioma of T2. No acute nor suspicious osseous abnormality. CT ABDOMEN PELVIS FINDINGS  Hepatobiliary: No focal liver abnormality is seen. Status post cholecystectomy. No biliary dilatation. Pancreas: Unremarkable. No pancreatic ductal dilatation or surrounding inflammatory changes. Spleen: Normal in size without focal abnormality. Adrenals/Urinary Tract: Adrenal glands are unremarkable. Kidneys are normal, without renal calculi, focal lesion, or hydronephrosis. Bladder is unremarkable. Stomach/Bowel: Small hiatal hernia. Physiologic distention of the stomach with normal duodenal sweep and ligament of Treitz position. No small bowel dilatation or inflammation. No obstruction is noted. The distal and terminal ileum are normal. Normal appearing appendix. Vascular/Lymphatic: Aortoiliac atherosclerosis without aneurysm. No lymphadenopathy by CT size criteria. Reproductive: Status post hysterectomy. No adnexal masses. Other: No abdominal wall hernia or abnormality. No abdominopelvic ascites. Musculoskeletal: No fracture is seen. IMPRESSION: CT chest: 1. 5.7 mm in average ovoid nodular density in the right lower lobe adjacent to the major fissure. Non-contrast chest CT at 6-12 months is recommended. If the nodule is stable at time of repeat CT, then future CT at 18-24 months (from today's scan) is considered optional for low-risk patients, but is recommended for high-risk patients. This recommendation follows the consensus statement: Guidelines for Management of Incidental Pulmonary Nodules Detected on CT Images: From the Fleischner Society 2017; Radiology 2017; 284:228-243. 2. Coronary arteriosclerosis and aortic atherosclerosis. No aneurysm. CT AP: 1. No acute bowel inflammation or obstruction. 2. Aortoiliac atherosclerosis without aneurysm. 3. Vertebral body hemangioma of T2. 4. Hysterectomy and cholecystectomy. Aortic Atherosclerosis (ICD10-I70.0). Electronically Signed   By: Ashley Royalty M.D.   On: 08/09/2018 21:11   Ct Chest Wo Contrast  Result Date: 08/09/2018 CLINICAL DATA:  Vomiting, dizziness and  poor balance with hypotension beginning yesterday. Acute renal injury. EXAM: CT CHEST, ABDOMEN AND PELVIS WITHOUT CONTRAST TECHNIQUE: Multidetector CT imaging of the chest, abdomen and pelvis was performed following the standard protocol without IV contrast. COMPARISON:  CT abdomen and pelvis 08/03/2013, process of the abdomen 07/22/2018 FINDINGS: CT CHEST  FINDINGS Cardiovascular: Normal heart size with coronary arteriosclerosis along the LAD and left circumflex. No pericardial effusion or thickening. Atherosclerosis of the thoracic aorta without aneurysm. Left subclavian artery stent at its origin. Minimal atherosclerosis is otherwise noted of the great vessels with conventional branch pattern. The unenhanced pulmonary vasculature is unremarkable without significant dilatation of the main pulmonary artery. Mediastinum/Nodes: No enlarged mediastinal, hilar, or axillary lymph nodes. Thyroid gland, trachea, and esophagus demonstrate no significant findings. Lungs/Pleura: 5.7 mm in average ovoid nodular density in the anterior right lower lobe adjacent to the major fissure. Bibasilar dependent atelectasis. No pneumothorax or edema. Musculoskeletal: Vertebral body hemangioma of T2. No acute nor suspicious osseous abnormality. CT ABDOMEN PELVIS FINDINGS Hepatobiliary: No focal liver abnormality is seen. Status post cholecystectomy. No biliary dilatation. Pancreas: Unremarkable. No pancreatic ductal dilatation or surrounding inflammatory changes. Spleen: Normal in size without focal abnormality. Adrenals/Urinary Tract: Adrenal glands are unremarkable. Kidneys are normal, without renal calculi, focal lesion, or hydronephrosis. Bladder is unremarkable. Stomach/Bowel: Small hiatal hernia. Physiologic distention of the stomach with normal duodenal sweep and ligament of Treitz position. No small bowel dilatation or inflammation. No obstruction is noted. The distal and terminal ileum are normal. Normal appearing appendix.  Vascular/Lymphatic: Aortoiliac atherosclerosis without aneurysm. No lymphadenopathy by CT size criteria. Reproductive: Status post hysterectomy. No adnexal masses. Other: No abdominal wall hernia or abnormality. No abdominopelvic ascites. Musculoskeletal: No fracture is seen. IMPRESSION: CT chest: 1. 5.7 mm in average ovoid nodular density in the right lower lobe adjacent to the major fissure. Non-contrast chest CT at 6-12 months is recommended. If the nodule is stable at time of repeat CT, then future CT at 18-24 months (from today's scan) is considered optional for low-risk patients, but is recommended for high-risk patients. This recommendation follows the consensus statement: Guidelines for Management of Incidental Pulmonary Nodules Detected on CT Images: From the Fleischner Society 2017; Radiology 2017; 284:228-243. 2. Coronary arteriosclerosis and aortic atherosclerosis. No aneurysm. CT AP: 1. No acute bowel inflammation or obstruction. 2. Aortoiliac atherosclerosis without aneurysm. 3. Vertebral body hemangioma of T2. 4. Hysterectomy and cholecystectomy. Aortic Atherosclerosis (ICD10-I70.0). Electronically Signed   By: Ashley Royalty M.D.   On: 08/09/2018 21:11   Dg Chest Port 1 View  Result Date: 08/09/2018 CLINICAL DATA:  Dizziness EXAM: PORTABLE CHEST 1 VIEW COMPARISON:  07/21/2018 FINDINGS: Left subclavian artery stent unchanged. Mild atherosclerotic calcification of the aortic arch. Heart size within normal limits. The lungs appear clear. IMPRESSION: 1.  No active cardiopulmonary disease is radiographically apparent. 2. Left subclavian artery stent. 3.  Aortic Atherosclerosis (ICD10-I70.0). Electronically Signed   By: Van Clines M.D.   On: 08/09/2018 19:44    Assessment/Plan Principal Problem:   Nausea and vomiting Active Problems:   Hyperlipidemia   Hypothyroidism   Essential hypertension   Hypokalemia   Diabetes mellitus type 2 in obese (HCC)   AKI (acute kidney injury) ([place])    Leukocytosis   Bipolar depression (HCC)   Esophagitis determined by endoscopy   Elevated troponin   Hypotension in a patient with hypertension -Secondary to dehydration and poor oral intake.  Blood pressure improving with IV fluids.  Continue normal saline at 150 cc an hour with intravenous potassium.  Monitor blood pressure.  Hold antihypertensives.  Nausea and vomiting in a patient with recent diagnosis of grade D esophagitis -Questionable cause.  Might be secondary to esophagitis.  Patient states that vomiting has been improving since stopping Carafate.  We will stop Carafate.  Use Protonix intravenously. N.p.o. for  now.  Clear liquid diet in a.m.  CT of the abdomen and pelvis was unremarkable.  If symptoms do not improve, might need GI evaluation.  Acute kidney injury -Most likely prerenal from dehydration -IV fluids as above.  Repeat a.m. labs.  Hold losartan  Leukocytosis with elevated lactic acid -No evidence of sepsis or infection at this time.  We will continue hydration.  Patient received IV antibiotics in the ED.  No source of infection seen on the imaging studies.  Will follow urinalysis but patient denies any urinary symptoms.  Will not continue antibiotics at this time  Positive troponin -No signs of chest pain.  EKG does not show ischemic changes.  Trend troponins.  Hypokalemia -Replace.  Repeat a.m. labs  Hypothyroidism -Continue Synthroid  Bipolar disorder -Resume home meds once able to take orally  Diabetes mellitus type 2  -CBGs with insulin sliding scale coverage  Prolonged QT interval -Repeat EKG in a.m.  Minimize QT prolonging medications if possible.  Incidental lung nodule -Outpatient follow-up with repeat CT in 6 to 12 months  Morbid obesity -Outpatient follow-up  DVT prophylaxis: Lovenox Code Status: Full Family Communication: None at bedside Disposition Plan: Home in 1 to 2 days once clinically improves Consults called: None Admission status:  Observation/progressive care unit  Severity of Illness: The appropriate patient status for this patient is OBSERVATION. Observation status is judged to be reasonable and necessary in order to provide the required intensity of service to ensure the patient's safety. The patient's presenting symptoms, physical exam findings, and initial radiographic and laboratory data in the context of their medical condition is felt to place them at decreased risk for further clinical deterioration. Furthermore, it is anticipated that the patient will be medically stable for discharge from the hospital within 2 midnights of admission. The following factors support the patient status of observation.   " The patient's presenting symptoms include vomiting/dizziness. " The physical exam findings include hypertension/dehydration. " The initial radiographic and laboratory data are acute kidney injury/leukocytosis/elevated lactic acid/elevated troponin.      Aline August MD [facility] Pager [phone]  If 7PM-7AM, please contact night-coverage [email or web address] Password [email or web address]  08/09/2018, 10:08 PM

## 2018-08-09 NOTE — ED Notes (Signed)
Dr. Darl Householder and I prioritized meds for this pt based on limited IV access and her multiple issues. Meropenem firstm, followed by first run of k+, followed by vanc and then 3 additional runs of k+.

## 2018-08-10 DIAGNOSIS — I951 Orthostatic hypotension: Secondary | ICD-10-CM

## 2018-08-10 DIAGNOSIS — R7989 Other specified abnormal findings of blood chemistry: Secondary | ICD-10-CM | POA: Diagnosis not present

## 2018-08-10 DIAGNOSIS — F319 Bipolar disorder, unspecified: Secondary | ICD-10-CM | POA: Diagnosis not present

## 2018-08-10 DIAGNOSIS — E1169 Type 2 diabetes mellitus with other specified complication: Secondary | ICD-10-CM | POA: Diagnosis not present

## 2018-08-10 DIAGNOSIS — N179 Acute kidney failure, unspecified: Secondary | ICD-10-CM | POA: Diagnosis not present

## 2018-08-10 LAB — CBC
HCT: 35.9 % — ABNORMAL LOW (ref 36.0–46.0)
Hemoglobin: 11.2 g/dL — ABNORMAL LOW (ref 12.0–15.0)
MCH: 20.5 pg — ABNORMAL LOW (ref 26.0–34.0)
MCHC: 31.2 g/dL (ref 30.0–36.0)
MCV: 65.6 fL — ABNORMAL LOW (ref 80.0–100.0)
Platelets: 297 10*3/uL (ref 150–400)
RBC: 5.47 MIL/uL — ABNORMAL HIGH (ref 3.87–5.11)
RDW: 15.7 % — AB (ref 11.5–15.5)
WBC: 15.6 10*3/uL — ABNORMAL HIGH (ref 4.0–10.5)
nRBC: 0 % (ref 0.0–0.2)

## 2018-08-10 LAB — COMPREHENSIVE METABOLIC PANEL
ALT: 51 U/L — ABNORMAL HIGH (ref 0–44)
AST: 53 U/L — ABNORMAL HIGH (ref 15–41)
Albumin: 2.8 g/dL — ABNORMAL LOW (ref 3.5–5.0)
Alkaline Phosphatase: 48 U/L (ref 38–126)
Anion gap: 10 (ref 5–15)
BUN: 14 mg/dL (ref 6–20)
CO2: 19 mmol/L — ABNORMAL LOW (ref 22–32)
CREATININE: 1.29 mg/dL — AB (ref 0.44–1.00)
Calcium: 7.8 mg/dL — ABNORMAL LOW (ref 8.9–10.3)
Chloride: 104 mmol/L (ref 98–111)
GFR calc Af Amer: 54 mL/min — ABNORMAL LOW (ref >60)
GFR calc non Af Amer: 47 mL/min — ABNORMAL LOW (ref >60)
Glucose, Bld: 162 mg/dL — ABNORMAL HIGH (ref 70–99)
Potassium: 3.9 mmol/L (ref 3.5–5.1)
Sodium: 133 mmol/L — ABNORMAL LOW (ref 135–145)
Total Bilirubin: 0.9 mg/dL (ref 0.3–1.2)
Total Protein: 5.3 g/dL — ABNORMAL LOW (ref 6.5–8.1)

## 2018-08-10 LAB — TROPONIN I
Troponin I: 0.08 ng/mL (ref <0.03)
Troponin I: 0.06 ng/mL (ref <0.03)
Troponin I: 0.04 ng/mL (ref <0.03)

## 2018-08-10 LAB — URINALYSIS, ROUTINE W REFLEX MICROSCOPIC
Bilirubin Urine: NEGATIVE
Glucose, UA: NEGATIVE mg/dL
Hgb urine dipstick: NEGATIVE
Ketones, ur: NEGATIVE mg/dL
LEUKOCYTES UA: NEGATIVE
Nitrite: NEGATIVE
Protein, ur: NEGATIVE mg/dL
Specific Gravity, Urine: 1.006 (ref 1.005–1.030)
pH: 6 (ref 5.0–8.0)

## 2018-08-10 LAB — CBG MONITORING, ED
Glucose-Capillary: 156 mg/dL — ABNORMAL HIGH (ref 70–99)
Glucose-Capillary: 169 mg/dL — ABNORMAL HIGH (ref 70–99)
Glucose-Capillary: 285 mg/dL — ABNORMAL HIGH (ref 70–99)

## 2018-08-10 LAB — GLUCOSE, CAPILLARY
GLUCOSE-CAPILLARY: 203 mg/dL — AB (ref 70–99)
Glucose-Capillary: 113 mg/dL — ABNORMAL HIGH (ref 70–99)

## 2018-08-10 LAB — LACTIC ACID, PLASMA: Lactic Acid, Venous: 2.3 mmol/L (ref 0.5–1.9)

## 2018-08-10 LAB — PATHOLOGIST SMEAR REVIEW

## 2018-08-10 MED ORDER — POTASSIUM CHLORIDE 10 MEQ/100ML IV SOLN
10.0000 meq | INTRAVENOUS | Status: AC
  Administered 2018-08-10 (×2): 10 meq via INTRAVENOUS

## 2018-08-10 MED ORDER — MAGNESIUM SULFATE IN D5W 1-5 GM/100ML-% IV SOLN
1.0000 g | Freq: Once | INTRAVENOUS | Status: AC
  Administered 2018-08-10: 1 g via INTRAVENOUS
  Filled 2018-08-10 (×2): qty 100

## 2018-08-10 MED ORDER — SODIUM CHLORIDE 0.9 % IV SOLN
INTRAVENOUS | Status: DC
  Administered 2018-08-10: 1000 mL via INTRAVENOUS
  Administered 2018-08-11: 02:00:00 via INTRAVENOUS

## 2018-08-10 MED ORDER — DOXEPIN HCL 25 MG PO CAPS
25.0000 mg | ORAL_CAPSULE | Freq: Every day | ORAL | Status: DC
  Administered 2018-08-10: 25 mg via ORAL
  Filled 2018-08-10 (×2): qty 1

## 2018-08-10 MED ORDER — INSULIN PUMP
SUBCUTANEOUS | Status: DC
  Administered 2018-08-10: 17:00:00 via SUBCUTANEOUS
  Administered 2018-08-11: 3.4 via SUBCUTANEOUS
  Filled 2018-08-10: qty 1

## 2018-08-10 NOTE — Progress Notes (Addendum)
PROGRESS NOTE    Morgan [name]  MWN:[id number]  DOB: [birth year]  DOA: 08/09/2018 PCP: Bradly Bienenstock., MD  Brief Narrative:  53 y.o. female with medical history significant of diabetes mellitus type 2, hypertension, hypothyroidism, bipolar disorder, C. difficile colitis, recent admission and discharged on 07/26/2018 for odynophagia/dysphagia secondary to grade D esophagitis swollen EGD for which patient was discharged on on Carafate along with pantoprazole and famotidine presented with vomiting for the last 2 to 3 days associated with dizzy spells and falls. Patient apparently was noted to be orthostatic by the physical therapist and referred to PCP who advised patient to come to ER.  Patient states at home she stopped taking Carafate as she felt her blood pressure medications were not being absorbed.  She also reports that interestingly her nausea vomiting resolved after she stopped taking Carafate.  She also reports chronic ankle edema and was told to have "weak veins".  She takes losartan at home.    Subjective: Patient feeling much better this morning.  Still in the ED awaiting bed availability.  She is tolerating liquid diet.  Supine blood pressure seems to be in systolic 403K.  He denies any fevers or abdominal pain.   Objective: Vitals:   08/10/18 0730 08/10/18 0745 08/10/18 0800 08/10/18 0816  BP: (!) 119/52 (!) 115/57 129/70 (!) 114/59  Pulse: 83 88 (!) 146 90  Resp:      Temp:      TempSrc:      SpO2: 97% 97% 100% 98%  Weight:      Height:        Intake/Output Summary (Last 24 hours) at 08/10/2018 1012 Last data filed at 08/10/2018 0700 Gross per 24 hour  Intake 2383.24 ml  Output -  Net 2383.24 ml   Filed Weights   08/09/18 1903  Weight: 113.4 kg    Physical Examination:  General exam: Appears calm and comfortable  Respiratory system: Clear to auscultation. Respiratory effort normal. Cardiovascular system: S1 & S2 heard, RRR. No JVD, murmurs, rubs,  gallops or clicks. No pedal edema. Gastrointestinal system: Abdomen is nondistended, soft and nontender. No organomegaly or masses felt. Normal bowel sounds heard. Central nervous system: Alert and oriented. No focal neurological deficits. Extremities: Symmetric 5 x 5 power. Skin: Prominent veins in lower extremities and telangiectasis noted in bilateral feet/ankle area Psychiatry: Judgement and insight appear normal. Mood & affect appropriate.     Data Reviewed: I have personally reviewed following labs and imaging studies  CBC: Recent Labs  Lab 08/09/18 1933 08/10/18 0523  WBC 20.1* 15.6*  NEUTROABS 11.8*  --   HGB 13.4 11.2*  HCT 42.4 35.9*  MCV 65.1* 65.6*  PLT 420* 742   Basic Metabolic Panel: Recent Labs  Lab 08/09/18 1933 08/09/18 2100 08/10/18 0523  NA 136  --  133*  K 2.5*  --  3.9  CL 97*  --  104  CO2 27  --  19*  GLUCOSE 57*  --  162*  BUN 18  --  14  CREATININE 2.18*  --  1.29*  CALCIUM 9.2  --  7.8*  MG  --  1.9  --    GFR: Estimated Creatinine Clearance: 59.9 mL/min (A) (by C-G formula based on SCr of 1.29 mg/dL (H)). Liver Function Tests: Recent Labs  Lab 08/09/18 1933 08/10/18 0523  AST 76* 53*  ALT 64* 51*  ALKPHOS 59 48  BILITOT 0.8 0.9  PROT 6.5 5.3*  ALBUMIN 3.5 2.8*  Recent Labs  Lab 08/09/18 1933  LIPASE 24   No results for input(s): AMMONIA in the last 168 hours. Coagulation Profile: No results for input(s): INR, PROTIME in the last 168 hours. Cardiac Enzymes: Recent Labs  Lab 08/10/18 0202 08/10/18 0523  TROPONINI 0.08* 0.06*   BNP (last 3 results) No results for input(s): PROBNP in the last 8760 hours. HbA1C: No results for input(s): HGBA1C in the last 72 hours. CBG: Recent Labs  Lab 08/10/18 0230 08/10/18 0740  GLUCAP 156* 169*   Lipid Profile: No results for input(s): CHOL, HDL, LDLCALC, TRIG, CHOLHDL, LDLDIRECT in the last 72 hours. Thyroid Function Tests: No results for input(s): TSH, T4TOTAL, FREET4,  T3FREE, THYROIDAB in the last 72 hours. Anemia Panel: No results for input(s): VITAMINB12, FOLATE, FERRITIN, TIBC, IRON, RETICCTPCT in the last 72 hours. Sepsis Labs: Recent Labs  Lab 08/09/18 1942 08/09/18 2141 08/10/18 0202  LATICACIDVEN 2.95* 4.32* 2.3*    Recent Results (from the past 240 hour(s))  Blood culture (routine x 2)     Status: None (Preliminary result)   Collection Time: 08/09/18  9:20 PM  Result Value Ref Range Status   Specimen Description BLOOD RIGHT ANTECUBITAL  Final   Special Requests   Final    BOTTLES DRAWN AEROBIC AND ANAEROBIC Blood Culture adequate volume   Culture   Final    NO GROWTH < 12 HOURS Performed at [facility], [place] [address] [place], [place] [address]    Report Status PENDING  Incomplete  Blood culture (routine x 2)     Status: None (Preliminary result)   Collection Time: 08/09/18 10:56 PM  Result Value Ref Range Status   Specimen Description BLOOD LEFT HAND  Final   Special Requests   Final    BOTTLES DRAWN AEROBIC AND ANAEROBIC Blood Culture adequate volume   Culture   Final    NO GROWTH < 12 HOURS Performed at [facility], [place] [address], [place], [place] [address]    Report Status PENDING  Incomplete      Radiology Studies: Ct Abdomen Pelvis Wo Contrast  Result Date: 08/09/2018 CLINICAL DATA:  Vomiting, dizziness and poor balance with hypotension beginning yesterday. Acute renal injury. EXAM: CT CHEST, ABDOMEN AND PELVIS WITHOUT CONTRAST TECHNIQUE: Multidetector CT imaging of the chest, abdomen and pelvis was performed following the standard protocol without IV contrast. COMPARISON:  CT abdomen and pelvis 08/03/2013, process of the abdomen 07/22/2018 FINDINGS: CT CHEST FINDINGS Cardiovascular: Normal heart size with coronary arteriosclerosis along the LAD and left circumflex. No pericardial effusion or thickening. Atherosclerosis of the thoracic aorta without aneurysm. Left subclavian artery stent at its origin. Minimal  atherosclerosis is otherwise noted of the great vessels with conventional branch pattern. The unenhanced pulmonary vasculature is unremarkable without significant dilatation of the main pulmonary artery. Mediastinum/Nodes: No enlarged mediastinal, hilar, or axillary lymph nodes. Thyroid gland, trachea, and esophagus demonstrate no significant findings. Lungs/Pleura: 5.7 mm in average ovoid nodular density in the anterior right lower lobe adjacent to the major fissure. Bibasilar dependent atelectasis. No pneumothorax or edema. Musculoskeletal: Vertebral body hemangioma of T2. No acute nor suspicious osseous abnormality. CT ABDOMEN PELVIS FINDINGS Hepatobiliary: No focal liver abnormality is seen. Status post cholecystectomy. No biliary dilatation. Pancreas: Unremarkable. No pancreatic ductal dilatation or surrounding inflammatory changes. Spleen: Normal in size without focal abnormality. Adrenals/Urinary Tract: Adrenal glands are unremarkable. Kidneys are normal, without renal calculi, focal lesion, or hydronephrosis. Bladder is unremarkable. Stomach/Bowel: Small hiatal hernia. Physiologic distention of  the stomach with normal duodenal sweep and ligament of Treitz position. No small bowel dilatation or inflammation. No obstruction is noted. The distal and terminal ileum are normal. Normal appearing appendix. Vascular/Lymphatic: Aortoiliac atherosclerosis without aneurysm. No lymphadenopathy by CT size criteria. Reproductive: Status post hysterectomy. No adnexal masses. Other: No abdominal wall hernia or abnormality. No abdominopelvic ascites. Musculoskeletal: No fracture is seen. IMPRESSION: CT chest: 1. 5.7 mm in average ovoid nodular density in the right lower lobe adjacent to the major fissure. Non-contrast chest CT at 6-12 months is recommended. If the nodule is stable at time of repeat CT, then future CT at 18-24 months (from today's scan) is considered optional for low-risk patients, but is recommended for  high-risk patients. This recommendation follows the consensus statement: Guidelines for Management of Incidental Pulmonary Nodules Detected on CT Images: From the Fleischner Society 2017; Radiology 2017; 284:228-243. 2. Coronary arteriosclerosis and aortic atherosclerosis. No aneurysm. CT AP: 1. No acute bowel inflammation or obstruction. 2. Aortoiliac atherosclerosis without aneurysm. 3. Vertebral body hemangioma of T2. 4. Hysterectomy and cholecystectomy. Aortic Atherosclerosis (ICD10-I70.0). Electronically Signed   By: Ashley Royalty M.D.   On: 08/09/2018 21:11   Ct Chest Wo Contrast  Result Date: 08/09/2018 CLINICAL DATA:  Vomiting, dizziness and poor balance with hypotension beginning yesterday. Acute renal injury. EXAM: CT CHEST, ABDOMEN AND PELVIS WITHOUT CONTRAST TECHNIQUE: Multidetector CT imaging of the chest, abdomen and pelvis was performed following the standard protocol without IV contrast. COMPARISON:  CT abdomen and pelvis 08/03/2013, process of the abdomen 07/22/2018 FINDINGS: CT CHEST FINDINGS Cardiovascular: Normal heart size with coronary arteriosclerosis along the LAD and left circumflex. No pericardial effusion or thickening. Atherosclerosis of the thoracic aorta without aneurysm. Left subclavian artery stent at its origin. Minimal atherosclerosis is otherwise noted of the great vessels with conventional branch pattern. The unenhanced pulmonary vasculature is unremarkable without significant dilatation of the main pulmonary artery. Mediastinum/Nodes: No enlarged mediastinal, hilar, or axillary lymph nodes. Thyroid gland, trachea, and esophagus demonstrate no significant findings. Lungs/Pleura: 5.7 mm in average ovoid nodular density in the anterior right lower lobe adjacent to the major fissure. Bibasilar dependent atelectasis. No pneumothorax or edema. Musculoskeletal: Vertebral body hemangioma of T2. No acute nor suspicious osseous abnormality. CT ABDOMEN PELVIS FINDINGS Hepatobiliary: No  focal liver abnormality is seen. Status post cholecystectomy. No biliary dilatation. Pancreas: Unremarkable. No pancreatic ductal dilatation or surrounding inflammatory changes. Spleen: Normal in size without focal abnormality. Adrenals/Urinary Tract: Adrenal glands are unremarkable. Kidneys are normal, without renal calculi, focal lesion, or hydronephrosis. Bladder is unremarkable. Stomach/Bowel: Small hiatal hernia. Physiologic distention of the stomach with normal duodenal sweep and ligament of Treitz position. No small bowel dilatation or inflammation. No obstruction is noted. The distal and terminal ileum are normal. Normal appearing appendix. Vascular/Lymphatic: Aortoiliac atherosclerosis without aneurysm. No lymphadenopathy by CT size criteria. Reproductive: Status post hysterectomy. No adnexal masses. Other: No abdominal wall hernia or abnormality. No abdominopelvic ascites. Musculoskeletal: No fracture is seen. IMPRESSION: CT chest: 1. 5.7 mm in average ovoid nodular density in the right lower lobe adjacent to the major fissure. Non-contrast chest CT at 6-12 months is recommended. If the nodule is stable at time of repeat CT, then future CT at 18-24 months (from today's scan) is considered optional for low-risk patients, but is recommended for high-risk patients. This recommendation follows the consensus statement: Guidelines for Management of Incidental Pulmonary Nodules Detected on CT Images: From the Fleischner Society 2017; Radiology 2017; 284:228-243. 2. Coronary arteriosclerosis and aortic atherosclerosis. No aneurysm.  CT AP: 1. No acute bowel inflammation or obstruction. 2. Aortoiliac atherosclerosis without aneurysm. 3. Vertebral body hemangioma of T2. 4. Hysterectomy and cholecystectomy. Aortic Atherosclerosis (ICD10-I70.0). Electronically Signed   By: Ashley Royalty M.D.   On: 08/09/2018 21:11   Dg Chest Port 1 View  Result Date: 08/09/2018 CLINICAL DATA:  Dizziness EXAM: PORTABLE CHEST 1 VIEW  COMPARISON:  07/21/2018 FINDINGS: Left subclavian artery stent unchanged. Mild atherosclerotic calcification of the aortic arch. Heart size within normal limits. The lungs appear clear. IMPRESSION: 1.  No active cardiopulmonary disease is radiographically apparent. 2. Left subclavian artery stent. 3.  Aortic Atherosclerosis (ICD10-I70.0). Electronically Signed   By: Van Clines M.D.   On: 08/09/2018 19:44        Scheduled Meds: . aspirin EC  81 mg Oral Daily  . insulin aspart  0-15 Units Subcutaneous TID WC  . insulin aspart  0-5 Units Subcutaneous QHS  . levothyroxine  150 mcg Oral Q0600  . lurasidone  60 mg Oral Daily  . pantoprazole (PROTONIX) IV  40 mg Intravenous Q12H   Continuous Infusions: . 0.9 % NaCl with KCl 40 mEq / L 150 mL/hr (08/10/18 0859)  . meropenem (MERREM) IV Stopped (08/09/18 2143)    Assessment & Plan:    1.  Dizzy spells/orthostasis/falls: Likely secondary to dehydration in the setting of nausea vomiting and poor oral intake.  Continue to hold losartan.  Discussed with patient regarding support stockings and possibly permissive supine hypertension to systolic 428J in order to maintain adequate standing blood pressure.  Recheck orthostatic blood pressures after support stockings.  Continue to hydrate for another day at a lower IV fluid rate.  Avoid venodilators like Norvasc or nitrates.  2.  Nausea/vomiting: Secondary to esophagitis versus Carafate intolerance.  Continue PPI.  Hold Carafate.  Tolerating clear liquid diet, advance to regular diet this afternoon.  CT abdomen/pelvis unremarkable.  3.  AKI: Secondary to problem #2 and dehydration.  Hold ACE/ARBs.  Creatinine appears to be improving.  Hydrate another day and resume blood pressure medications as deemed appropriate after orthostatic check.  4.  Leukocytosis: Likely reactive in the setting of nausea/vomiting.  Denies any fevers.  CT chest/abdomen or pelvis unremarkable for any signs of infection.   Continue to monitor without antibiotics.  Received 1 dose of meropenem in the ED.  5.  Lactic acidosis and mildly elevated troponin: Likely secondary to problem #2 and #3.  Continue to monitor, appears to be downtrending.  EKG within normal limits.  No complaints of chest pain.  No signs of infection.  6.  Hypothyroidism: Synthroid  7.  Bipolar disorder: Resume home meds Latuda and lower dose Dexepin. Prozac and seroquel on hold . Prolonged Qtc on presentation. IV mag ordered to keep level >2. Labs and EKG in am  8. Diabetes mellitus: may use home insulin pump  Will downgrade to telemetry bed as patient hemodynamically improved. DVT prophylaxis: Lovenox Code Status: Full code Family / Patient Communication: Discussed with patient and all questions answered. Disposition Plan: Home with resumption of home health services.     LOS: 0 days    Time spent: 35 minutes    Guilford Shi, MD [facility] Pager [phone]  If 7PM-7AM, please contact night-coverage [email or web address] Password [email or web address] 08/10/2018, 10:12 AM

## 2018-08-10 NOTE — Progress Notes (Signed)
Inpatient Diabetes Program Recommendations  AACE/ADA: New Consensus Statement on Inpatient Glycemic Control (2015)  Target Ranges:  Prepandial:   less than 140 mg/dL      Peak postprandial:   less than 180 mg/dL (1-2 hours)      Critically ill patients:  140 - 180 mg/dL   Lab Results  Component Value Date   GLUCAP 285 (H) 08/10/2018   HGBA1C 10.4 (H) 07/26/2018    Review of Glycemic Control Results for [name], [name] (MRN [id number]) as of 08/10/2018 15:47  Ref. Range 08/10/2018 02:30 08/10/2018 07:40 08/10/2018 13:18  Glucose-Capillary Latest Ref Range: 70 - 99 mg/dL 156 (H) 169 (H) 285 (H)   Diabetes history: Type 2 DM Outpatient Diabetes medications: Insulin pump- Humalog, Ozempic 0.25 mg Qweek Current orders for Inpatient glycemic control: Novolog 0-15 units TID, Novolog 0-5 units QHS  Inpatient Diabetes Program Recommendations:    Noted consult, last saw patient on 07/22/18.   Patient is followed by Dr Denton Lank, endocrinology with last visit from 06/23/18. Last insulin pump rates were: Current Pump settings: 0000-0700: 3.0 u/hr 0700-0900: 3:0 u/hr 0900-0000: 10 u/hr  ICR- 12 ISF- 20  Per his note: "She is not checking her blood sugar consistently, some days does not check at all. Attributes her poor adherence to depression. Is not often bolusing for meals, which is likely the reason for her very high basal rates." Patient uses a [employer] insulin pump with Humalog insulin as an outpatient.   Patient has insulin pump in the bed with her and prefers to use at this time, however, at this time it is not connected. RN verified that she is able to tolerate food and keep it down, per her usual schedule. Educated Therapist, sports on insulin pump policy and that patient may be more susceptible to low CBGs and to initiate hypoglycemia protocol in this event. Patient has unusually high rates due to lack of patient bolusing for meals. Patient wanting to manage. Will plan to see patient in AM.  Discussed  plan with Dr Earnest Conroy for patient to apply insulin pump. Orders to be placed. NURSING: Once insulin pump order set is ordered please print off the Patient insulin pump contract and flow sheet. The insulin pump contract should be signed by the patient and then placed in the chart. The patient insulin pump flow sheet will be completed by the patient at the bedside and the RN caring for the patient will use the patient's flow sheet to document in the [facility]. RN will need to complete the Nursing Insulin Pump Flowsheet at least once a shift. Patient will need to keep extra insulin pump supplies at the bedside at all times.    Thanks, Bronson Curb, MSN, RNC-OB Diabetes Coordinator [phone] (8a-5p)

## 2018-08-10 NOTE — ED Notes (Signed)
Attempted report 

## 2018-08-10 NOTE — ED Notes (Signed)
Ambulated patient in hallway, pts vital signs stable prior to ambulation. Pt then became weak, near syncopal in hallway. Assisted to chair. Further assisted to bathroom and then back to bed. No explaination for patients weakness. Will place high fall risk arm band and use bedpan from here on out.

## 2018-08-10 NOTE — Progress Notes (Signed)
Morgan [name] [id number]  Code Status: FULL  Admission Data: 08/10/2018 7:57 PM  Attending Provider: Earnest Conroy  VOP:[id number], Mikey Bussing., MD  Consults/ Treatment Team:   [name] is a 53 y.o. female patient admitted from ED awake, alert - oriented X 4 - no acute distress noted. VSS - Blood pressure (!) 118/103, pulse 95, temperature 98.7 F (37.1 C), temperature source Oral, resp. rate 20, height 5\' 2"  (1.575 m), weight 113.4 kg, last menstrual period 08/03/2013, SpO2 99 %. no c/o shortness of breath, no c/o chest pain. Cardiac tele # 10, in place. Orientation to room, and floor completed with information packet given to patient/family. Admission INP armband ID verified with patient/family, and in place.  SR up x 2, fall assessment complete, with patient and family able to verbalize understanding of risk associated with falls, and verbalized understanding to call nsg before up out of bed. Call light within reach, patient able to voice, and demonstrate understanding. Skin, clean-dry- intact. No evidence of skin break down noted on exam.  ?  Will cont to eval and treat per MD orders.  Melonie Florida, RN  08/10/2018 7:57 PM

## 2018-08-10 NOTE — ED Notes (Signed)
Ordered bfast tray 

## 2018-08-10 NOTE — ED Notes (Signed)
Pt endorsed significant dizziness with ringing in ears upon standing for orthostatics.

## 2018-08-11 DIAGNOSIS — A0472 Enterocolitis due to Clostridium difficile, not specified as recurrent: Secondary | ICD-10-CM | POA: Diagnosis not present

## 2018-08-11 DIAGNOSIS — E785 Hyperlipidemia, unspecified: Secondary | ICD-10-CM | POA: Diagnosis not present

## 2018-08-11 DIAGNOSIS — E1169 Type 2 diabetes mellitus with other specified complication: Secondary | ICD-10-CM | POA: Diagnosis not present

## 2018-08-11 DIAGNOSIS — R9431 Abnormal electrocardiogram [ECG] [EKG]: Secondary | ICD-10-CM | POA: Diagnosis not present

## 2018-08-11 DIAGNOSIS — N179 Acute kidney failure, unspecified: Secondary | ICD-10-CM | POA: Diagnosis not present

## 2018-08-11 DIAGNOSIS — F319 Bipolar disorder, unspecified: Secondary | ICD-10-CM | POA: Diagnosis not present

## 2018-08-11 DIAGNOSIS — E039 Hypothyroidism, unspecified: Secondary | ICD-10-CM | POA: Diagnosis not present

## 2018-08-11 DIAGNOSIS — R112 Nausea with vomiting, unspecified: Secondary | ICD-10-CM | POA: Diagnosis not present

## 2018-08-11 LAB — MAGNESIUM: Magnesium: 1.7 mg/dL (ref 1.7–2.4)

## 2018-08-11 LAB — BASIC METABOLIC PANEL
Anion gap: 13 (ref 5–15)
BUN: 8 mg/dL (ref 6–20)
CO2: 16 mmol/L — ABNORMAL LOW (ref 22–32)
Calcium: 8.1 mg/dL — ABNORMAL LOW (ref 8.9–10.3)
Chloride: 110 mmol/L (ref 98–111)
Creatinine, Ser: 0.85 mg/dL (ref 0.44–1.00)
GFR calc non Af Amer: >60 mL/min (ref >60)
Glucose, Bld: 99 mg/dL (ref 70–99)
Potassium: 3.9 mmol/L (ref 3.5–5.1)
Sodium: 139 mmol/L (ref 135–145)

## 2018-08-11 LAB — GLUCOSE, CAPILLARY
Glucose-Capillary: 189 mg/dL — ABNORMAL HIGH (ref 70–99)
Glucose-Capillary: 71 mg/dL (ref 70–99)
Glucose-Capillary: 85 mg/dL (ref 70–99)
Glucose-Capillary: 91 mg/dL (ref 70–99)

## 2018-08-11 LAB — URINE CULTURE: Culture: NO GROWTH

## 2018-08-11 LAB — CBC
HCT: 39.3 % (ref 36.0–46.0)
Hemoglobin: 11.7 g/dL — ABNORMAL LOW (ref 12.0–15.0)
MCH: 20 pg — ABNORMAL LOW (ref 26.0–34.0)
MCHC: 29.8 g/dL — ABNORMAL LOW (ref 30.0–36.0)
MCV: 67.3 fL — ABNORMAL LOW (ref 80.0–100.0)
Platelets: 214 10*3/uL (ref 150–400)
RBC: 5.84 MIL/uL — ABNORMAL HIGH (ref 3.87–5.11)
RDW: 16 % — AB (ref 11.5–15.5)
WBC: 9.6 10*3/uL (ref 4.0–10.5)
nRBC: 0 % (ref 0.0–0.2)

## 2018-08-11 MED ORDER — AMLODIPINE BESYLATE 5 MG PO TABS: 5.0000 mg | ORAL_TABLET | Freq: Every day | ORAL | 0 refills | Status: DC

## 2018-08-11 NOTE — Progress Notes (Signed)
Pt given discharge instructions, prescriptions, and care notes. Pt verbalized understanding AEB no further questions or concerns at this time. IV was discontinued, no redness, pain, or swelling noted at this time. Telemetry discontinued and Centralized Telemetry was notified. Pt left the floor via wheelchair with volunteer services in stable condition.

## 2018-08-11 NOTE — Progress Notes (Signed)
Inpatient Diabetes Program Recommendations  AACE/ADA: New Consensus Statement on Inpatient Glycemic Control (2015)  Target Ranges:  Prepandial:   less than 140 mg/dL      Peak postprandial:   less than 180 mg/dL (1-2 hours)      Critically ill patients:  140 - 180 mg/dL   Lab Results  Component Value Date   GLUCAP 85 08/11/2018   HGBA1C 10.4 (H) 07/26/2018    Review of Glycemic Control Results for Morgan [name], Morgan [name] (MRN [id number]) as of 08/11/2018 10:37  Ref. Range 08/10/2018 21:48 08/11/2018 00:01 08/11/2018 05:22 08/11/2018 08:16  Glucose-Capillary Latest Ref Range: 70 - 99 mg/dL 113 (H) 91 71 85   Diabetes history:Type 2 DM Outpatient Diabetes medications:Insulin pump- Humalog, Ozempic 0.25 mg Qweek Current orders for Inpatient glycemic control:insulin pump  Inpatient Diabetes Program Recommendations:  Spoke with patient as patient has insulin pump. Upon entering the room, patient states, "I may have made a mistake and have received 300 units per my pump."  Patient reports waking up this morning to an empty insulin cartridge and assumed that somehow she had received the 300 units.  Verified the insulin pump was turned off at 0930. Reviewed recent CBGs and noted that RN at bedside was giving patient juice. At this time, patient not experiencing hypoglycemia (verified by serum) and asymptomatic. Reviewed the insulin pump with the patient. Patient denies leaking fluid from the cartridge, tubing or on the device when issue was discovered. When reviewed administration history on the device, the pump showed a total daily dose of 31.125 units received thus far for today 08/11/2018 and yesterday's totals of 90 units. Both of which are appropriate based off of patient's insulin pump settings.  Patient did report that she felt she may have gotten the old cartridge at the time of restart confused with the new one, however there is no way to confirm this. However, at this time, based off of  current trends I am assuming that patient did not receive that high amount of insulin.  After further review noted that settings had been decreased by Dr Denton Lank at last visit (see updated settings below).   Current Pump settings: 0000-0700: 2.4 u/hr 0700-0900: 2.4 u/hr 0900-0000: 8 u/hr ICR- 12 ISF- 20 Target goal: 120-120 mg/dL  Spoke with RN to discuss patient's situation. Both in agreement that patient is alert and oriented and seems appropriate to continue with insulin pump. Encouraged RN to check another CBG at 1100 and if CBG >90 mg/dL, insulin pump can be restarted and confirmed this with Dr Sloan Leiter.  Anticipate DC today, will continue to follow.   Thanks, Bronson Curb, MSN, RNC-OB Diabetes Coordinator [phone] (8a-5p)

## 2018-08-11 NOTE — Discharge Summary (Signed)
PATIENT DETAILS Name: Morgan [name] Age: 53 y.o. Sex: female Date of Birth: November 14, 1965 MRN: [id number]. Admitting Physician: Aline August, MD SAY:[id number], Mikey Bussing., MD  Admit Date: 08/09/2018 Discharge date: 08/11/2018  Recommendations for Outpatient Follow-up:  1. Follow up with PCP in 1-2 weeks 2. Please obtain BMP/CBC in one week  Admitted From:  Home with home health services  Disposition: Home with home health [place]: Yes  Equipment/Devices: None  Discharge Condition: Stable  CODE STATUS: FULL CODE/  Diet recommendation:  Heart Healthy / Carb Modified   Brief Summary: See H&P, Labs, Consult and Test reports for all details in brief, history of DM-2, hypertension, dyslipidemia, bipolar disorder, hypothyroidism, recent admission for odynophagia/dysphagia-EGD revealed grade D esophagitis-presenting with nausea and vomiting and unsteady gait.  Brief Hospital Course: Nausea with vomiting: Resolved-patient thinks that this is from Carafate and she does not want to take this any further.  In any event-her exam is benign-no evidence of bowel obstruction.  She was managed with supportive care-by day of discharge tolerating regular diet.  Unsteady gait/dizzy spells: High suspicion for orthostatic hypotension in the setting of dizziness-patient was hypotensive on presentation.  However orthostatic vital signs (done the next day) were negative.  Currently able to ambulate in the hallway in the room without much difficulties.  She feels much better and is already dressed and wants to go home today.  Stable for discharge-doubt further work-up is required  AKI: Likely hemodynamically mediated-resolved with supportive care.  Leukocytosis: Likely reactive-no foci of infection apparent-monitored without use of any antimicrobial agents.  Blood/urine cultures were negative.  Hypokalemia: Secondary to GI loss-repleted.  Mildly prolonged QTC: Secondary to  hypokalemia-QTc interval has corrected as of EKG on 1/10 morning.  Insulin-dependent DM-2: Continue insulin pump-there was a question this morning with the patient had inadvertently boluses of a large amount of insulin-seen by diabetic coordinator who evaluated the pump-this was felt to be unlikely.  Patient reports to me that she probably put a empty cartridge in the pump and she probably got confused.   In any event patient was monitored for more than a few hours-CBG stable-okay to discharge-continue insulin pump.  Bipolar disorder: Stable-resume usual medications on discharge-now the QTc interval has corrected  Recent history of odynophagia/dysphagia secondary to grade D esophagitis: Continue PPI-patient is not keen on taking Carafate anymore.  Nursing staff-no major odynophagia/dysphagia seen during this hospital stay  Right lung nodule: This was seen on her most recent CT scan done in her prior admission-needs repeat CT of the chest in 6 months-this is being deferred to the outpatient setting  Procedures/Studies: None  Discharge Diagnoses:  Principal Problem:   Nausea and vomiting Active Problems:   Hyperlipidemia   Hypothyroidism   Essential hypertension   Hypokalemia   Diabetes mellitus type 2 in obese (HCC)   AKI (acute kidney injury) ([place])   Leukocytosis   Bipolar depression (HCC)   Esophagitis determined by endoscopy   Elevated troponin   Discharge Instructions:  Activity:  As tolerated   Discharge Instructions    Call MD for:  persistant dizziness or light-headedness   Complete by:  As directed    Call MD for:  persistant nausea and vomiting   Complete by:  As directed    Diet - low sodium heart healthy   Complete by:  As directed    Diet Carb Modified   Complete by:  As directed    Discharge instructions   Complete by:  As directed    Follow with Primary MD  Bradly Bienenstock., MD in 1 week  You have a right lung nodule-repeat CT chest is recommended  in 6 months.  Please ask your PCP/primary care MD to repeat a CT chest-in some cases these nodules turn cancerous.  Please get a complete blood count and chemistry panel checked by your Primary MD at your next visit, and again as instructed by your Primary MD.  Get Medicines reviewed and adjusted: Please take all your medications with you for your next visit with your Primary MD  Laboratory/radiological data: Please request your Primary MD to go over all hospital tests and procedure/radiological results at the follow up, please ask your Primary MD to get all Hospital records sent to his/her office.  In some cases, they will be blood work, cultures and biopsy results pending at the time of your discharge. Please request that your primary care M.D. follows up on these results.  Also Note the following: If you experience worsening of your admission symptoms, develop shortness of breath, life threatening emergency, suicidal or homicidal thoughts you must seek medical attention immediately by calling 911 or calling your MD immediately  if symptoms less severe.  You must read complete instructions/literature along with all the possible adverse reactions/side effects for all the Medicines you take and that have been prescribed to you. Take any new Medicines after you have completely understood and accpet all the possible adverse reactions/side effects.   Do not drive when taking Pain medications or sleeping medications (Benzodaizepines)  Do not take more than prescribed Pain, Sleep and Anxiety Medications. It is not advisable to combine anxiety,sleep and pain medications without talking with your primary care practitioner  Special Instructions: If you have smoked or chewed Tobacco  in the last 2 yrs please stop smoking, stop any regular Alcohol  and or any Recreational drug use.  Wear Seat belts while driving.  Please note: You were cared for by a hospitalist during your hospital stay. Once you  are discharged, your primary care physician will handle any further medical issues. Please note that NO REFILLS for any discharge medications will be authorized once you are discharged, as it is imperative that you return to your primary care physician (or establish a relationship with a primary care physician if you do not have one) for your post hospital discharge needs so that they can reassess your need for medications and monitor your lab values.   Increase activity slowly   Complete by:  As directed      Allergies as of 08/11/2018      Reactions   Ilosone [erythromycin] Anaphylaxis   Keflex [cephalexin] Anaphylaxis   Penicillins Anaphylaxis   Has patient had a PCN reaction causing immediate rash, facial/tongue/throat swelling, SOB or lightheadedness with hypotension: YES Has patient had a PCN reaction causing severe rash involving mucus membranes or skin necrosis: NO Has patient had a PCN reaction that required hospitalizationNO Has patient had a PCN reaction occurring within the last 10 years: NO If all of the above answers are "NO", then may proceed with Cephalosporin use.   Iron Other (See Comments)   Pt reports condition limiting Iron intake.   Metformin And Related Other (See Comments)   Pt prefers to not take this medication   Morphine And Related Other (See Comments)   Pt reports hallucinations      Medication List    STOP taking these medications   losartan 100 MG tablet Commonly  known as:  COZAAR   sucralfate 1 g tablet Commonly known as:  CARAFATE     TAKE these medications   amLODipine 5 MG tablet Commonly known as:  NORVASC Take 1 tablet (5 mg total) by mouth daily.   aspirin 81 MG tablet Take 81 mg by mouth daily.   butalbital-acetaminophen-caffeine 50-325-40 MG tablet Commonly known as:  FIORICET, ESGIC Take 1-2 tablets by mouth every 6 (six) hours as needed for headache or migraine.   CONTOUR NEXT ONE Kit 1 kit by Does not apply route as directed.     doxepin 25 MG capsule Commonly known as:  SINEQUAN Take 50 mg by mouth at bedtime.   EPINEPHrine 0.3 mg/0.3 mL Soaj injection Commonly known as:  EPI-PEN Inject 0.3 mg into the muscle once as needed (for an anaphylactic reaction).   famotidine 20 MG tablet Commonly known as:  PEPCID Take 1 tablet (20 mg total) by mouth 2 (two) times daily. What changed:  when to take this   FLUoxetine 20 MG tablet Commonly known as:  PROZAC Take 60 mg by mouth every morning.   GLUCAGON EMERGENCY 1 MG injection Generic drug:  glucagon Inject 1 mg into the vein once as needed (FOR ONSET OF HYPOGLYCEMIA).   insulin pump Soln Inject into the skin continuous. Humalog   LATUDA 60 MG Tabs Generic drug:  Lurasidone HCl Take 60 mg by mouth daily.   levothyroxine 150 MCG tablet Commonly known as:  SYNTHROID, LEVOTHROID Take 150 mcg by mouth daily.   multivitamin with minerals Tabs tablet Take 1 tablet by mouth daily.   nitroGLYCERIN 0.4 MG SL tablet Commonly known as:  NITROSTAT Place 1 tablet (0.4 mg total) under the tongue every 5 (five) minutes x 3 doses as needed for chest pain.   OZEMPIC (0.25 OR 0.5 MG/DOSE) [place] Inject 0.5 mg into the skin every Monday.   pantoprazole 40 MG tablet Commonly known as:  PROTONIX Take 1 tablet (40 mg total) by mouth 2 (two) times daily before a meal. X 8 weeks   QUEtiapine 50 MG tablet Commonly known as:  SEROQUEL Take 150 mg by mouth at bedtime.   Vitamin D3 50 MCG (2000 UT) Tabs Take 2,000-4,000 Units by mouth See admin instructions. Take 4,000 units by mouth once a day on Mon/Wed/Fri and 2,000 units on Sun/Tues/Thurs/Sat      Follow-up Information    Bradly Bienenstock., MD. Schedule an appointment as soon as possible for a visit in 1 week(s).   Contact information: [address] [place] [address] [phone]          Allergies  Allergen Reactions  . Ilosone [Erythromycin] Anaphylaxis  . Keflex [Cephalexin]  Anaphylaxis  . Penicillins Anaphylaxis    Has patient had a PCN reaction causing immediate rash, facial/tongue/throat swelling, SOB or lightheadedness with hypotension: YES Has patient had a PCN reaction causing severe rash involving mucus membranes or skin necrosis: NO Has patient had a PCN reaction that required hospitalizationNO Has patient had a PCN reaction occurring within the last 10 years: NO If all of the above answers are "NO", then may proceed with Cephalosporin use.  . Iron Other (See Comments)    Pt reports condition limiting Iron intake.  . Metformin And Related Other (See Comments)    Pt prefers to not take this medication  . Morphine And Related Other (See Comments)    Pt reports hallucinations    Consultations:  None  Other Procedures/Studies: Ct Abdomen  Pelvis Wo Contrast  Result Date: 08/09/2018 CLINICAL DATA:  Vomiting, dizziness and poor balance with hypotension beginning yesterday. Acute renal injury. EXAM: CT CHEST, ABDOMEN AND PELVIS WITHOUT CONTRAST TECHNIQUE: Multidetector CT imaging of the chest, abdomen and pelvis was performed following the standard protocol without IV contrast. COMPARISON:  CT abdomen and pelvis 08/03/2013, process of the abdomen 07/22/2018 FINDINGS: CT CHEST FINDINGS Cardiovascular: Normal heart size with coronary arteriosclerosis along the LAD and left circumflex. No pericardial effusion or thickening. Atherosclerosis of the thoracic aorta without aneurysm. Left subclavian artery stent at its origin. Minimal atherosclerosis is otherwise noted of the great vessels with conventional branch pattern. The unenhanced pulmonary vasculature is unremarkable without significant dilatation of the main pulmonary artery. Mediastinum/Nodes: No enlarged mediastinal, hilar, or axillary lymph nodes. Thyroid gland, trachea, and esophagus demonstrate no significant findings. Lungs/Pleura: 5.7 mm in average ovoid nodular density in the anterior right lower lobe  adjacent to the major fissure. Bibasilar dependent atelectasis. No pneumothorax or edema. Musculoskeletal: Vertebral body hemangioma of T2. No acute nor suspicious osseous abnormality. CT ABDOMEN PELVIS FINDINGS Hepatobiliary: No focal liver abnormality is seen. Status post cholecystectomy. No biliary dilatation. Pancreas: Unremarkable. No pancreatic ductal dilatation or surrounding inflammatory changes. Spleen: Normal in size without focal abnormality. Adrenals/Urinary Tract: Adrenal glands are unremarkable. Kidneys are normal, without renal calculi, focal lesion, or hydronephrosis. Bladder is unremarkable. Stomach/Bowel: Small hiatal hernia. Physiologic distention of the stomach with normal duodenal sweep and ligament of Treitz position. No small bowel dilatation or inflammation. No obstruction is noted. The distal and terminal ileum are normal. Normal appearing appendix. Vascular/Lymphatic: Aortoiliac atherosclerosis without aneurysm. No lymphadenopathy by CT size criteria. Reproductive: Status post hysterectomy. No adnexal masses. Other: No abdominal wall hernia or abnormality. No abdominopelvic ascites. Musculoskeletal: No fracture is seen. IMPRESSION: CT chest: 1. 5.7 mm in average ovoid nodular density in the right lower lobe adjacent to the major fissure. Non-contrast chest CT at 6-12 months is recommended. If the nodule is stable at time of repeat CT, then future CT at 18-24 months (from today's scan) is considered optional for low-risk patients, but is recommended for high-risk patients. This recommendation follows the consensus statement: Guidelines for Management of Incidental Pulmonary Nodules Detected on CT Images: From the Fleischner Society 2017; Radiology 2017; 284:228-243. 2. Coronary arteriosclerosis and aortic atherosclerosis. No aneurysm. CT AP: 1. No acute bowel inflammation or obstruction. 2. Aortoiliac atherosclerosis without aneurysm. 3. Vertebral body hemangioma of T2. 4. Hysterectomy and  cholecystectomy. Aortic Atherosclerosis (ICD10-I70.0). Electronically Signed   By: Ashley Royalty M.D.   On: 08/09/2018 21:11   Dg Chest 2 View  Result Date: 07/21/2018 CLINICAL DATA:  Heartburn EXAM: CHEST - 2 VIEW COMPARISON:  06/27/2018 FINDINGS: The heart size and mediastinal contours are within normal limits. Both lungs are clear. Stent cephalad to the aortic arch IMPRESSION: No active cardiopulmonary disease. Electronically Signed   By: Donavan Foil M.D.   On: 07/21/2018 15:57   Ct Chest Wo Contrast  Result Date: 08/09/2018 CLINICAL DATA:  Vomiting, dizziness and poor balance with hypotension beginning yesterday. Acute renal injury. EXAM: CT CHEST, ABDOMEN AND PELVIS WITHOUT CONTRAST TECHNIQUE: Multidetector CT imaging of the chest, abdomen and pelvis was performed following the standard protocol without IV contrast. COMPARISON:  CT abdomen and pelvis 08/03/2013, process of the abdomen 07/22/2018 FINDINGS: CT CHEST FINDINGS Cardiovascular: Normal heart size with coronary arteriosclerosis along the LAD and left circumflex. No pericardial effusion or thickening. Atherosclerosis of the thoracic aorta without aneurysm. Left subclavian artery stent at  its origin. Minimal atherosclerosis is otherwise noted of the great vessels with conventional branch pattern. The unenhanced pulmonary vasculature is unremarkable without significant dilatation of the main pulmonary artery. Mediastinum/Nodes: No enlarged mediastinal, hilar, or axillary lymph nodes. Thyroid gland, trachea, and esophagus demonstrate no significant findings. Lungs/Pleura: 5.7 mm in average ovoid nodular density in the anterior right lower lobe adjacent to the major fissure. Bibasilar dependent atelectasis. No pneumothorax or edema. Musculoskeletal: Vertebral body hemangioma of T2. No acute nor suspicious osseous abnormality. CT ABDOMEN PELVIS FINDINGS Hepatobiliary: No focal liver abnormality is seen. Status post cholecystectomy. No biliary  dilatation. Pancreas: Unremarkable. No pancreatic ductal dilatation or surrounding inflammatory changes. Spleen: Normal in size without focal abnormality. Adrenals/Urinary Tract: Adrenal glands are unremarkable. Kidneys are normal, without renal calculi, focal lesion, or hydronephrosis. Bladder is unremarkable. Stomach/Bowel: Small hiatal hernia. Physiologic distention of the stomach with normal duodenal sweep and ligament of Treitz position. No small bowel dilatation or inflammation. No obstruction is noted. The distal and terminal ileum are normal. Normal appearing appendix. Vascular/Lymphatic: Aortoiliac atherosclerosis without aneurysm. No lymphadenopathy by CT size criteria. Reproductive: Status post hysterectomy. No adnexal masses. Other: No abdominal wall hernia or abnormality. No abdominopelvic ascites. Musculoskeletal: No fracture is seen. IMPRESSION: CT chest: 1. 5.7 mm in average ovoid nodular density in the right lower lobe adjacent to the major fissure. Non-contrast chest CT at 6-12 months is recommended. If the nodule is stable at time of repeat CT, then future CT at 18-24 months (from today's scan) is considered optional for low-risk patients, but is recommended for high-risk patients. This recommendation follows the consensus statement: Guidelines for Management of Incidental Pulmonary Nodules Detected on CT Images: From the Fleischner Society 2017; Radiology 2017; 284:228-243. 2. Coronary arteriosclerosis and aortic atherosclerosis. No aneurysm. CT AP: 1. No acute bowel inflammation or obstruction. 2. Aortoiliac atherosclerosis without aneurysm. 3. Vertebral body hemangioma of T2. 4. Hysterectomy and cholecystectomy. Aortic Atherosclerosis (ICD10-I70.0). Electronically Signed   By: Ashley Royalty M.D.   On: 08/09/2018 21:11   Ct Angio Chest Pe W And/or Wo Contrast  Result Date: 07/21/2018 CLINICAL DATA:  Chest pain.  PE suspected. EXAM: CT ANGIOGRAPHY CHEST WITH CONTRAST TECHNIQUE: Multidetector  CT imaging of the chest was performed using the standard protocol during bolus administration of intravenous contrast. Multiplanar CT image reconstructions and MIPs were obtained to evaluate the vascular anatomy. There was extravasation of contrast at the end of the injection. Opacification of pulmonary arteries is diagnostic. Exam the patient. The area of the extravasation in the left forearm was blue. Equal compress was being applied. The area measured 3 x 8 cm. I outlined the area on the patient's skin. Distal sensation and blood flow was intact. CONTRAST:  69m ISOVUE-370 IOPAMIDOL (ISOVUE-370) INJECTION 76% COMPARISON:  Two-view chest x-ray 07/21/2018 FINDINGS: Cardiovascular: The heart size is normal. Coronary artery calcifications are present. Atherosclerotic changes are noted in the aorta and at the origins the great vessels. A stent is in place at the left subclavian artery. Pulmonary artery opacification is satisfactory. No focal filling defects are present to suggest pulmonary embolus. Mediastinum/Nodes: No significant mediastinal, hilar, or axillary adenopathy is present. Esophagus is normal. Thoracic inlet is within normal limits. Lungs/Pleura: Scattered ground-glass attenuation is present bilaterally. A 7 x 4 x 4 mm pulmonary nodule is noted along the major fissure in the right lower lobe. No other focal nodule or mass lesion is present. There is no significant pleural effusion. Upper Abdomen: Upper abdomen is remarkable for cholecystectomy. No solid organ  lesions are present. Musculoskeletal: Vertebral body heights are maintained. A hemangioma is present at T2. Marrow density is otherwise normal. Sternum is intact. Ribs are within normal limits. Review of the MIP images confirms the above findings. IMPRESSION: 1. No pulmonary embolus. 2. Contrast extravasation into the left upper extremity. Recommend core compress and elevation. Recommend continued surveillance. Patient is at low risk for  complication. 3. Right lower lobe pulmonary nodule associated with the minor fissure. Me in size of the nodule is 5.5 mm. Non-contrast chest CT at 6-12 months is recommended. If the nodule is stable at time of repeat CT, then future CT at 18-24 months (from today's scan) is considered optional for low-risk patients, but is recommended for high-risk patients. This recommendation follows the consensus statement: Guidelines for Management of Incidental Pulmonary Nodules Detected on CT Images: From the Fleischner Society 2017; Radiology 2017; 284:228-243. 4. Left subclavian artery stent. 5.  Aortic Atherosclerosis (ICD10-I70.0). Electronically Signed   By: San Morelle M.D.   On: 07/21/2018 18:46   Dg Esophagus  Result Date: 07/24/2018 CLINICAL DATA:  Odynophagia. Feels like food and pills get stuck in throat. EXAM: ESOPHOGRAM/BARIUM SWALLOW TECHNIQUE: Single contrast examination was performed using thin barium and barium tablet. FLUOROSCOPY TIME:  Fluoroscopy Time:  1 minutes 6 second Radiation Exposure Index (if provided by the fluoroscopic device): Number of Acquired Spot Images: 0 COMPARISON:  None. FINDINGS: Examination is limited due to limited mobility the patient. The study was performed in the left posterior oblique position with the patient head elevated approximately 20 degrees. Pharyngeal swallowing is negative. No aspiration. No stricture in the pharynx or esophagus. Mild to moderate esophageal dysmotility. Mild dilatation esophagus with decreased motility. No esophageal stricture or mass Negative for hiatal hernia Barium tablet initially with slow to pass the vallecula. Barium tablet then passed readily into the distal esophagus where it stayed for several minutes. No stricture is identified. This could be due to poor esophageal motility. IMPRESSION: Mild to moderate esophageal dysmotility. No stricture or mass. No hiatal hernia Barium tablet was slow to pass through the pharynx and also did  not pass into the stomach. This is likely due to dysmotility as well as semi supine positioning. Electronically Signed   By: Franchot Gallo M.D.   On: 07/24/2018 09:22   Dg Chest Port 1 View  Result Date: 08/09/2018 CLINICAL DATA:  Dizziness EXAM: PORTABLE CHEST 1 VIEW COMPARISON:  07/21/2018 FINDINGS: Left subclavian artery stent unchanged. Mild atherosclerotic calcification of the aortic arch. Heart size within normal limits. The lungs appear clear. IMPRESSION: 1.  No active cardiopulmonary disease is radiographically apparent. 2. Left subclavian artery stent. 3.  Aortic Atherosclerosis (ICD10-I70.0). Electronically Signed   By: Van Clines M.D.   On: 08/09/2018 19:44   US Abdomen Limited Ruq  Result Date: 07/22/2018 CLINICAL DATA:  Heartburn, nausea, vomiting, elevated LFTs EXAM: ULTRASOUND ABDOMEN LIMITED RIGHT UPPER QUADRANT COMPARISON:  07/21/2018 FINDINGS: Gallbladder: Surgically absent Common bile duct: Diameter: 4 mm Liver: Increased hepatic echogenicity compatible with hepatic steatosis. This limits evaluation for a focal hepatic abnormality. No biliary dilatation. Portal vein is patent on color Doppler imaging with normal direction of blood flow towards the liver. IMPRESSION: Remote cholecystectomy No biliary dilatation or obstruction Hepatic steatosis Electronically Signed   By: Jerilynn Mages.  Shick M.D.   On: 07/22/2018 09:14      TODAY-DAY OF DISCHARGE:  Subjective:   Morgan [name] today has no headache,no chest abdominal pain,no new weakness tingling or numbness, feels much better wants to  go home today.   Objective:   Blood pressure 126/67, pulse 99, temperature 98 F (36.7 C), resp. rate 16, height '5\' 2"'  (1.575 m), weight 113.4 kg, last menstrual period 08/03/2013, SpO2 96 %.  Intake/Output Summary (Last 24 hours) at 08/11/2018 0915 Last data filed at 08/11/2018 0542 Gross per 24 hour  Intake 1450 ml  Output 51 ml  Net 1399 ml   Filed Weights   08/09/18 1903 08/10/18 1436    Weight: 113.4 kg 113.4 kg    Exam: Awake Alert, Oriented *3, No new F.N deficits, Normal affect [place].AT,PERRAL Supple Neck,No JVD, No cervical lymphadenopathy appriciated.  Symmetrical Chest wall movement, Good air movement bilaterally, CTAB RRR,No Gallops,Rubs or new Murmurs, No Parasternal Heave +ve B.Sounds, Abd Soft, Non tender, No organomegaly appriciated, No rebound -guarding or rigidity. No Cyanosis, Clubbing or edema, No new Rash or bruise   PERTINENT RADIOLOGIC STUDIES: Ct Abdomen Pelvis Wo Contrast  Result Date: 08/09/2018 CLINICAL DATA:  Vomiting, dizziness and poor balance with hypotension beginning yesterday. Acute renal injury. EXAM: CT CHEST, ABDOMEN AND PELVIS WITHOUT CONTRAST TECHNIQUE: Multidetector CT imaging of the chest, abdomen and pelvis was performed following the standard protocol without IV contrast. COMPARISON:  CT abdomen and pelvis 08/03/2013, process of the abdomen 07/22/2018 FINDINGS: CT CHEST FINDINGS Cardiovascular: Normal heart size with coronary arteriosclerosis along the LAD and left circumflex. No pericardial effusion or thickening. Atherosclerosis of the thoracic aorta without aneurysm. Left subclavian artery stent at its origin. Minimal atherosclerosis is otherwise noted of the great vessels with conventional branch pattern. The unenhanced pulmonary vasculature is unremarkable without significant dilatation of the main pulmonary artery. Mediastinum/Nodes: No enlarged mediastinal, hilar, or axillary lymph nodes. Thyroid gland, trachea, and esophagus demonstrate no significant findings. Lungs/Pleura: 5.7 mm in average ovoid nodular density in the anterior right lower lobe adjacent to the major fissure. Bibasilar dependent atelectasis. No pneumothorax or edema. Musculoskeletal: Vertebral body hemangioma of T2. No acute nor suspicious osseous abnormality. CT ABDOMEN PELVIS FINDINGS Hepatobiliary: No focal liver abnormality is seen. Status post cholecystectomy. No  biliary dilatation. Pancreas: Unremarkable. No pancreatic ductal dilatation or surrounding inflammatory changes. Spleen: Normal in size without focal abnormality. Adrenals/Urinary Tract: Adrenal glands are unremarkable. Kidneys are normal, without renal calculi, focal lesion, or hydronephrosis. Bladder is unremarkable. Stomach/Bowel: Small hiatal hernia. Physiologic distention of the stomach with normal duodenal sweep and ligament of Treitz position. No small bowel dilatation or inflammation. No obstruction is noted. The distal and terminal ileum are normal. Normal appearing appendix. Vascular/Lymphatic: Aortoiliac atherosclerosis without aneurysm. No lymphadenopathy by CT size criteria. Reproductive: Status post hysterectomy. No adnexal masses. Other: No abdominal wall hernia or abnormality. No abdominopelvic ascites. Musculoskeletal: No fracture is seen. IMPRESSION: CT chest: 1. 5.7 mm in average ovoid nodular density in the right lower lobe adjacent to the major fissure. Non-contrast chest CT at 6-12 months is recommended. If the nodule is stable at time of repeat CT, then future CT at 18-24 months (from today's scan) is considered optional for low-risk patients, but is recommended for high-risk patients. This recommendation follows the consensus statement: Guidelines for Management of Incidental Pulmonary Nodules Detected on CT Images: From the Fleischner Society 2017; Radiology 2017; 284:228-243. 2. Coronary arteriosclerosis and aortic atherosclerosis. No aneurysm. CT AP: 1. No acute bowel inflammation or obstruction. 2. Aortoiliac atherosclerosis without aneurysm. 3. Vertebral body hemangioma of T2. 4. Hysterectomy and cholecystectomy. Aortic Atherosclerosis (ICD10-I70.0). Electronically Signed   By: Ashley Royalty M.D.   On: 08/09/2018 21:11   Dg Chest 2  View  Result Date: 07/21/2018 CLINICAL DATA:  Heartburn EXAM: CHEST - 2 VIEW COMPARISON:  06/27/2018 FINDINGS: The heart size and mediastinal contours are  within normal limits. Both lungs are clear. Stent cephalad to the aortic arch IMPRESSION: No active cardiopulmonary disease. Electronically Signed   By: Donavan Foil M.D.   On: 07/21/2018 15:57   Ct Chest Wo Contrast  Result Date: 08/09/2018 CLINICAL DATA:  Vomiting, dizziness and poor balance with hypotension beginning yesterday. Acute renal injury. EXAM: CT CHEST, ABDOMEN AND PELVIS WITHOUT CONTRAST TECHNIQUE: Multidetector CT imaging of the chest, abdomen and pelvis was performed following the standard protocol without IV contrast. COMPARISON:  CT abdomen and pelvis 08/03/2013, process of the abdomen 07/22/2018 FINDINGS: CT CHEST FINDINGS Cardiovascular: Normal heart size with coronary arteriosclerosis along the LAD and left circumflex. No pericardial effusion or thickening. Atherosclerosis of the thoracic aorta without aneurysm. Left subclavian artery stent at its origin. Minimal atherosclerosis is otherwise noted of the great vessels with conventional branch pattern. The unenhanced pulmonary vasculature is unremarkable without significant dilatation of the main pulmonary artery. Mediastinum/Nodes: No enlarged mediastinal, hilar, or axillary lymph nodes. Thyroid gland, trachea, and esophagus demonstrate no significant findings. Lungs/Pleura: 5.7 mm in average ovoid nodular density in the anterior right lower lobe adjacent to the major fissure. Bibasilar dependent atelectasis. No pneumothorax or edema. Musculoskeletal: Vertebral body hemangioma of T2. No acute nor suspicious osseous abnormality. CT ABDOMEN PELVIS FINDINGS Hepatobiliary: No focal liver abnormality is seen. Status post cholecystectomy. No biliary dilatation. Pancreas: Unremarkable. No pancreatic ductal dilatation or surrounding inflammatory changes. Spleen: Normal in size without focal abnormality. Adrenals/Urinary Tract: Adrenal glands are unremarkable. Kidneys are normal, without renal calculi, focal lesion, or hydronephrosis. Bladder is  unremarkable. Stomach/Bowel: Small hiatal hernia. Physiologic distention of the stomach with normal duodenal sweep and ligament of Treitz position. No small bowel dilatation or inflammation. No obstruction is noted. The distal and terminal ileum are normal. Normal appearing appendix. Vascular/Lymphatic: Aortoiliac atherosclerosis without aneurysm. No lymphadenopathy by CT size criteria. Reproductive: Status post hysterectomy. No adnexal masses. Other: No abdominal wall hernia or abnormality. No abdominopelvic ascites. Musculoskeletal: No fracture is seen. IMPRESSION: CT chest: 1. 5.7 mm in average ovoid nodular density in the right lower lobe adjacent to the major fissure. Non-contrast chest CT at 6-12 months is recommended. If the nodule is stable at time of repeat CT, then future CT at 18-24 months (from today's scan) is considered optional for low-risk patients, but is recommended for high-risk patients. This recommendation follows the consensus statement: Guidelines for Management of Incidental Pulmonary Nodules Detected on CT Images: From the Fleischner Society 2017; Radiology 2017; 284:228-243. 2. Coronary arteriosclerosis and aortic atherosclerosis. No aneurysm. CT AP: 1. No acute bowel inflammation or obstruction. 2. Aortoiliac atherosclerosis without aneurysm. 3. Vertebral body hemangioma of T2. 4. Hysterectomy and cholecystectomy. Aortic Atherosclerosis (ICD10-I70.0). Electronically Signed   By: Ashley Royalty M.D.   On: 08/09/2018 21:11   Ct Angio Chest Pe W And/or Wo Contrast  Result Date: 07/21/2018 CLINICAL DATA:  Chest pain.  PE suspected. EXAM: CT ANGIOGRAPHY CHEST WITH CONTRAST TECHNIQUE: Multidetector CT imaging of the chest was performed using the standard protocol during bolus administration of intravenous contrast. Multiplanar CT image reconstructions and MIPs were obtained to evaluate the vascular anatomy. There was extravasation of contrast at the end of the injection. Opacification of  pulmonary arteries is diagnostic. Exam the patient. The area of the extravasation in the left forearm was blue. Equal compress was being applied. The area measured  3 x 8 cm. I outlined the area on the patient's skin. Distal sensation and blood flow was intact. CONTRAST:  22m ISOVUE-370 IOPAMIDOL (ISOVUE-370) INJECTION 76% COMPARISON:  Two-view chest x-ray 07/21/2018 FINDINGS: Cardiovascular: The heart size is normal. Coronary artery calcifications are present. Atherosclerotic changes are noted in the aorta and at the origins the great vessels. A stent is in place at the left subclavian artery. Pulmonary artery opacification is satisfactory. No focal filling defects are present to suggest pulmonary embolus. Mediastinum/Nodes: No significant mediastinal, hilar, or axillary adenopathy is present. Esophagus is normal. Thoracic inlet is within normal limits. Lungs/Pleura: Scattered ground-glass attenuation is present bilaterally. A 7 x 4 x 4 mm pulmonary nodule is noted along the major fissure in the right lower lobe. No other focal nodule or mass lesion is present. There is no significant pleural effusion. Upper Abdomen: Upper abdomen is remarkable for cholecystectomy. No solid organ lesions are present. Musculoskeletal: Vertebral body heights are maintained. A hemangioma is present at T2. Marrow density is otherwise normal. Sternum is intact. Ribs are within normal limits. Review of the MIP images confirms the above findings. IMPRESSION: 1. No pulmonary embolus. 2. Contrast extravasation into the left upper extremity. Recommend core compress and elevation. Recommend continued surveillance. Patient is at low risk for complication. 3. Right lower lobe pulmonary nodule associated with the minor fissure. Me in size of the nodule is 5.5 mm. Non-contrast chest CT at 6-12 months is recommended. If the nodule is stable at time of repeat CT, then future CT at 18-24 months (from today's scan) is considered optional for low-risk  patients, but is recommended for high-risk patients. This recommendation follows the consensus statement: Guidelines for Management of Incidental Pulmonary Nodules Detected on CT Images: From the Fleischner Society 2017; Radiology 2017; 284:228-243. 4. Left subclavian artery stent. 5.  Aortic Atherosclerosis (ICD10-I70.0). Electronically Signed   By: CSan MorelleM.D.   On: 07/21/2018 18:46   Dg Esophagus  Result Date: 07/24/2018 CLINICAL DATA:  Odynophagia. Feels like food and pills get stuck in throat. EXAM: ESOPHOGRAM/BARIUM SWALLOW TECHNIQUE: Single contrast examination was performed using thin barium and barium tablet. FLUOROSCOPY TIME:  Fluoroscopy Time:  1 minutes 6 second Radiation Exposure Index (if provided by the fluoroscopic device): Number of Acquired Spot Images: 0 COMPARISON:  None. FINDINGS: Examination is limited due to limited mobility the patient. The study was performed in the left posterior oblique position with the patient head elevated approximately 20 degrees. Pharyngeal swallowing is negative. No aspiration. No stricture in the pharynx or esophagus. Mild to moderate esophageal dysmotility. Mild dilatation esophagus with decreased motility. No esophageal stricture or mass Negative for hiatal hernia Barium tablet initially with slow to pass the vallecula. Barium tablet then passed readily into the distal esophagus where it stayed for several minutes. No stricture is identified. This could be due to poor esophageal motility. IMPRESSION: Mild to moderate esophageal dysmotility. No stricture or mass. No hiatal hernia Barium tablet was slow to pass through the pharynx and also did not pass into the stomach. This is likely due to dysmotility as well as semi supine positioning. Electronically Signed   By: CFranchot GalloM.D.   On: 07/24/2018 09:22   Dg Chest Port 1 View  Result Date: 08/09/2018 CLINICAL DATA:  Dizziness EXAM: PORTABLE CHEST 1 VIEW COMPARISON:  07/21/2018 FINDINGS:  Left subclavian artery stent unchanged. Mild atherosclerotic calcification of the aortic arch. Heart size within normal limits. The lungs appear clear. IMPRESSION: 1.  No active cardiopulmonary disease  is radiographically apparent. 2. Left subclavian artery stent. 3.  Aortic Atherosclerosis (ICD10-I70.0). Electronically Signed   By: Van Clines M.D.   On: 08/09/2018 19:44   US Abdomen Limited Ruq  Result Date: 07/22/2018 CLINICAL DATA:  Heartburn, nausea, vomiting, elevated LFTs EXAM: ULTRASOUND ABDOMEN LIMITED RIGHT UPPER QUADRANT COMPARISON:  07/21/2018 FINDINGS: Gallbladder: Surgically absent Common bile duct: Diameter: 4 mm Liver: Increased hepatic echogenicity compatible with hepatic steatosis. This limits evaluation for a focal hepatic abnormality. No biliary dilatation. Portal vein is patent on color Doppler imaging with normal direction of blood flow towards the liver. IMPRESSION: Remote cholecystectomy No biliary dilatation or obstruction Hepatic steatosis Electronically Signed   By: Jerilynn Mages.  Shick M.D.   On: 07/22/2018 09:14     PERTINENT LAB RESULTS: CBC: Recent Labs    08/10/18 0523 08/11/18 0623  WBC 15.6* 9.6  HGB 11.2* 11.7*  HCT 35.9* 39.3  PLT 297 214   CMET CMP     Component Value Date/Time   NA 139 08/11/2018 0623   K 3.9 08/11/2018 0623   CL 110 08/11/2018 0623   CO2 16 (L) 08/11/2018 0623   GLUCOSE 99 08/11/2018 0623   BUN 8 08/11/2018 0623   CREATININE 0.85 08/11/2018 0623   CALCIUM 8.1 (L) 08/11/2018 0623   PROT 5.3 (L) 08/10/2018 0523   ALBUMIN 2.8 (L) 08/10/2018 0523   AST 53 (H) 08/10/2018 0523   ALT 51 (H) 08/10/2018 0523   ALKPHOS 48 08/10/2018 0523   BILITOT 0.9 08/10/2018 0523   GFRNONAA >60 08/11/2018 0623   GFRAA >60 08/11/2018 0623    GFR Estimated Creatinine Clearance: 90.1 mL/min (by C-G formula based on SCr of 0.85 mg/dL). Recent Labs    08/09/18 1933  LIPASE 24   Recent Labs    08/10/18 0202 08/10/18 0523 08/10/18 1200    TROPONINI 0.08* 0.06* 0.04*   Invalid input(s): POCBNP No results for input(s): DDIMER in the last 72 hours. No results for input(s): HGBA1C in the last 72 hours. No results for input(s): CHOL, HDL, LDLCALC, TRIG, CHOLHDL, LDLDIRECT in the last 72 hours. No results for input(s): TSH, T4TOTAL, T3FREE, THYROIDAB in the last 72 hours.  Invalid input(s): FREET3 No results for input(s): VITAMINB12, FOLATE, FERRITIN, TIBC, IRON, RETICCTPCT in the last 72 hours. Coags: No results for input(s): INR in the last 72 hours.  Invalid input(s): PT Microbiology: Recent Results (from the past 240 hour(s))  Blood culture (routine x 2)     Status: None (Preliminary result)   Collection Time: 08/09/18  9:20 PM  Result Value Ref Range Status   Specimen Description BLOOD RIGHT ANTECUBITAL  Final   Special Requests   Final    BOTTLES DRAWN AEROBIC AND ANAEROBIC Blood Culture adequate volume   Culture   Final    NO GROWTH 2 DAYS Performed at [facility], [address], [place], [place] [address]    Report Status PENDING  Incomplete  Blood culture (routine x 2)     Status: None (Preliminary result)   Collection Time: 08/09/18 10:56 PM  Result Value Ref Range Status   Specimen Description BLOOD LEFT HAND  Final   Special Requests   Final    BOTTLES DRAWN AEROBIC AND ANAEROBIC Blood Culture adequate volume   Culture   Final    NO GROWTH 2 DAYS Performed at [facility], [place] [address], [place], [place] [address]    Report Status PENDING  Incomplete  Urine culture  Status: None   Collection Time: 08/09/18 11:16 PM  Result Value Ref Range Status   Specimen Description URINE, RANDOM  Final   Special Requests NONE  Final   Culture   Final    NO GROWTH Performed at [facility], [address], [place], [place] [address]    Report Status 08/11/2018 FINAL  Final    FURTHER DISCHARGE INSTRUCTIONS:  Get Medicines reviewed and adjusted: Please take all your medications with  you for your next visit with your Primary MD  Laboratory/radiological data: Please request your Primary MD to go over all hospital tests and procedure/radiological results at the follow up, please ask your Primary MD to get all Hospital records sent to his/her office.  In some cases, they will be blood work, cultures and biopsy results pending at the time of your discharge. Please request that your primary care M.D. goes through all the records of your hospital data and follows up on these results.  Also Note the following: If you experience worsening of your admission symptoms, develop shortness of breath, life threatening emergency, suicidal or homicidal thoughts you must seek medical attention immediately by calling 911 or calling your MD immediately  if symptoms less severe.  You must read complete instructions/literature along with all the possible adverse reactions/side effects for all the Medicines you take and that have been prescribed to you. Take any new Medicines after you have completely understood and accpet all the possible adverse reactions/side effects.   Do not drive when taking Pain medications or sleeping medications (Benzodaizepines)  Do not take more than prescribed Pain, Sleep and Anxiety Medications. It is not advisable to combine anxiety,sleep and pain medications without talking with your primary care practitioner  Special Instructions: If you have smoked or chewed Tobacco  in the last 2 yrs please stop smoking, stop any regular Alcohol  and or any Recreational drug use.  Wear Seat belts while driving.  Please note: You were cared for by a hospitalist during your hospital stay. Once you are discharged, your primary care physician will handle any further medical issues. Please note that NO REFILLS for any discharge medications will be authorized once you are discharged, as it is imperative that you return to your primary care physician (or establish a relationship with a  primary care physician if you do not have one) for your post hospital discharge needs so that they can reassess your need for medications and monitor your lab values.  Total Time spent coordinating discharge including counseling, education and face to face time equals 35 minutes.  Signed: Laurianne Floresca 08/11/2018 9:15 AM

## 2018-08-11 NOTE — Evaluation (Signed)
Physical Therapy Evaluation Patient Details Name: Morgan [name] MRN: [id number] DOB: 1965/10/04 Today's Date: 08/11/2018   History of Present Illness  Patient is a 53 y.o female presenting to the ED on 08/09/18 due to vomitting, dizziness, and poor balanc with falls. Past medical history significant of diabetes mellitus type 2, hypertension, hypothyroidism, bipolar disorder, C. difficile colitis, recent admission and discharged on 07/26/2018 for odynophagia/dysphagia secondary to grade D esophagitis swollen EGD.     Clinical Impression  [name] admitted with the above listed diagnosis. Patient reports Mod I with mobility and ADLs prior to admission with SPC. Patient today requiring general min guard for OOB mobility for general safety. No reports of dizziness or imbalance with mobility. Patient to benefit from HHPT/OT in the home to ensure safe and independent functional mobility in the home environment. PT to follow acutely.     Follow Up Recommendations Home health PT;Supervision - Intermittent    Equipment Recommendations  3in1 (PT)    Recommendations for Other Services       Precautions / Restrictions Precautions Precautions: Fall Restrictions Weight Bearing Restrictions: No      Mobility  Bed Mobility Overal bed mobility: Modified Independent                Transfers Overall transfer level: Needs assistance Equipment used: Rolling walker (2 wheeled) Transfers: Sit to/from Stand Sit to Stand: Supervision         General transfer comment: supervision for safety and immediate standing balance  Ambulation/Gait Ambulation/Gait assistance: Min guard Gait Distance (Feet): 60 Feet Assistive device: Rolling walker (2 wheeled) Gait Pattern/deviations: Step-through pattern;Decreased stride length Gait velocity: Decreased   General Gait Details: slow steady gait pattern; no LOB; no reports of dizziness or imbalance  Stairs            Wheelchair Mobility     Modified Rankin (Stroke Patients Only)       Balance Overall balance assessment: Needs assistance Sitting-balance support: No upper extremity supported;Feet supported Sitting balance-Leahy Scale: Good     Standing balance support: Bilateral upper extremity supported;During functional activity Standing balance-Leahy Scale: Fair                               Pertinent Vitals/Pain Pain Assessment: No/denies pain    Home Living Family/patient expects to be discharged to:: Private residence Living Arrangements: Children Available Help at Discharge: Family;Available PRN/intermittently Type of Home: House Home Access: Stairs to enter Entrance Stairs-Rails: None Entrance Stairs-Number of Steps: 3 Home Layout: One level Home Equipment: Cane - single point;Walker - 2 wheels Additional Comments: lives with son, who she states is there most of the time    Prior Function Level of Independence: Independent with assistive device(s)         Comments: Intermittent use of SPC; indep with ADLs     Hand Dominance        Extremity/Trunk Assessment   Upper Extremity Assessment Upper Extremity Assessment: Defer to OT evaluation    Lower Extremity Assessment Lower Extremity Assessment: Generalized weakness    Cervical / Trunk Assessment Cervical / Trunk Assessment: Normal  Communication   Communication: No difficulties  Cognition Arousal/Alertness: Awake/alert Behavior During Therapy: WFL for tasks assessed/performed Overall Cognitive Status: Within Functional Limits for tasks assessed  General Comments General comments (skin integrity, edema, etc.): very motivated to return home    Exercises     Assessment/Plan    PT Assessment Patient needs continued PT services  PT Problem List Decreased strength;Decreased activity tolerance;Decreased balance;Decreased mobility;Decreased knowledge of use of  DME;Decreased safety awareness       PT Treatment Interventions DME instruction;Gait training;Stair training;Functional mobility training;Therapeutic activities;Therapeutic exercise;Balance training;Patient/family education    PT Goals (Current goals can be found in the Care Plan section)  Acute Rehab PT Goals Patient Stated Goal: I want to go home today PT Goal Formulation: With patient Time For Goal Achievement: 08/25/18 Potential to Achieve Goals: Good    Frequency Min 3X/week   Barriers to discharge Decreased caregiver support      Co-evaluation               AM-PAC PT "6 Clicks" Mobility  Outcome Measure Help needed turning from your back to your side while in a flat bed without using bedrails?: None Help needed moving from lying on your back to sitting on the side of a flat bed without using bedrails?: None Help needed moving to and from a bed to a chair (including a wheelchair)?: A Little Help needed standing up from a chair using your arms (e.g., wheelchair or bedside chair)?: A Little Help needed to walk in hospital room?: A Little Help needed climbing 3-5 steps with a railing? : A Little 6 Click Score: 20    End of Session Equipment Utilized During Treatment: Gait belt Activity Tolerance: Patient tolerated treatment well Patient left: in bed;with call bell/phone within reach Nurse Communication: Mobility status PT Visit Diagnosis: Other abnormalities of gait and mobility (R26.89);Muscle weakness (generalized) (M62.81);History of falling (Z91.81)    Time: 1537-9432 PT Time Calculation (min) (ACUTE ONLY): 18 min   Charges:   PT Evaluation $PT Eval Moderate Complexity: 1 Mod         Lanney Gins, PT, DPT Supplemental Physical Therapist 08/11/18 10:44 AM Pager: [phone] Office: [phone]

## 2018-08-14 LAB — CULTURE, BLOOD (ROUTINE X 2)
Culture: NO GROWTH
Culture: NO GROWTH
Special Requests: ADEQUATE
Special Requests: ADEQUATE

## 2018-08-29 ENCOUNTER — Telehealth: Payer: Self-pay

## 2018-08-29 NOTE — Telephone Encounter (Signed)
Copied from [place] [phone] [phone]. Topic: Quick Communication - Home Health Verbal Orders >> Aug 29, 2018 11:12 AM Alanda Slim E wrote: Caller/Agency: [facility] Number: [phone] ext [phone] Requesting bedside commode orders will be faxed again today

## 2018-09-14 ENCOUNTER — Encounter: Payer: Self-pay | Admitting: Internal Medicine

## 2018-09-25 ENCOUNTER — Ambulatory Visit (INDEPENDENT_AMBULATORY_CARE_PROVIDER_SITE_OTHER): Payer: 59 | Admitting: Internal Medicine

## 2018-09-25 ENCOUNTER — Encounter: Payer: Self-pay | Admitting: Internal Medicine

## 2018-09-25 VITALS — BP 158/68 | HR 113 | Ht 62.0 in | Wt 260.4 lb

## 2018-09-25 DIAGNOSIS — E1165 Type 2 diabetes mellitus with hyperglycemia: Secondary | ICD-10-CM | POA: Diagnosis not present

## 2018-09-25 DIAGNOSIS — R635 Abnormal weight gain: Secondary | ICD-10-CM | POA: Diagnosis not present

## 2018-09-25 DIAGNOSIS — Z794 Long term (current) use of insulin: Secondary | ICD-10-CM | POA: Diagnosis not present

## 2018-09-25 LAB — LIPID PANEL
Cholesterol: 190 mg/dL (ref 0–200)
HDL: 37.5 mg/dL — ABNORMAL LOW (ref >39.00)
NonHDL: 152.32
Total CHOL/HDL Ratio: 5
Triglycerides: 245 mg/dL — ABNORMAL HIGH (ref 0.0–149.0)
VLDL: 49 mg/dL — ABNORMAL HIGH (ref 0.0–40.0)

## 2018-09-25 LAB — LDL CHOLESTEROL, DIRECT: Direct LDL: 144 mg/dL

## 2018-09-25 MED ORDER — SEMAGLUTIDE (1 MG/DOSE) 2 MG/1.5ML ~~LOC~~ SOPN: 1.0000 mg | PEN_INJECTOR | SUBCUTANEOUS | 11 refills | Status: DC

## 2018-09-25 NOTE — Patient Instructions (Addendum)
  New Pump Settings   Basal rate       0000-0730  2.4 u/h    0730-0900  2.4 u/h    0900-0000  2.4 u/h       I:C ratio       0000  4.0                  Sensitivity       0000  20      Goal       0800-0000 120   0000 -0800 130   Increase Ozempic to 1 mg weekly     Fasting goal 80-130 mg/dL  Daytime goal less then 180 mg/dL   Check sugar before each meal and at bedtime   Choose healthy, lower carb lower calorie snacks: toss salad, cooked vegetables, cottage cheese, peanut butter, low fat cheese / string cheese, lower sodium deli meat, tuna salad or chicken salad    HOW TO TREAT LOW BLOOD SUGARS (Blood sugar LESS THAN 70 MG/DL)  Please follow the RULE OF 15 for the treatment of hypoglycemia treatment (when your (blood sugars are less than 70 mg/dL)    STEP 1: Take 15 grams of carbohydrates when your blood sugar is low, which includes:   3-4 GLUCOSE TABS  OR  3-4 OZ OF JUICE OR REGULAR SODA OR  ONE TUBE OF GLUCOSE GEL     STEP 2: RECHECK blood sugar in 15 MINUTES STEP 3: If your blood sugar is still low at the 15 minute recheck --> then, go back to STEP 1 and treat AGAIN with another 15 grams of carbohydrates.

## 2018-09-25 NOTE — Progress Notes (Signed)
Name: Morgan [name]  MRN/ DOB: [id number], [birth year]   Age/ Sex: 53 y.o., female    PCP: Bradly Bienenstock., MD   Reason for Endocrinology Evaluation: Type 2 Diabetes Mellitus     Date of Initial Endocrinology Visit: 09/25/2018     PATIENT IDENTIFIER: [name] is a 53 y.o. female with a past medical history of DM, hypothyroidism and HTN, bipolar depressive disorder .  The patient presented for initial endocrinology clinic visit on 09/25/2018 for consultative assistance with her diabetes management.    HPI: Ms. Swartout was    Diagnosed with T2DM many years  Prior Medications tried/Intolerance: Metformin- diarrhea.  SU, Actos . Has been on the pump since 2017.  Currently checking blood sugars 1 x / day,  Sporadically  Hypoglycemia episodes :   no          Hemoglobin A1c has ranged from 9.8% in 06/2018, peaking at 13.2% in 2017. Patient required assistance for hypoglycemia: no Patient has required hospitalization within the last 1 year from hyper or hypoglycemia:  no  In terms of diet, the patient  Snacks all the time, doesn't eat a proper meals . The patient drinks juice, but avoids other sugar-sweetened beverages.   This patient with type 2 diabetes is treated with Humalog (insulin pump). During the visit the pump basal and bolus doses were reviewed including carb/insulin rations and supplemental doses. The clinical list was updated. The glucose meter download was reviewed in detail to determine if the current pump settings are providing the best glycemic control without excessive hypoglycemia.    Works 11 AM to 10 pm for [employer]   Pump and meter download:    Pump   METRONIC     Insulin type   Humalog     Basal rate       0000-0730  2.4 u/h    0730-0900  2.4 u/h    0900-0000  8.0 u/h       I:C ratio       0000  3.0                  Sensitivity       0000  20      Goal       0800-0000 120   0000 -0800 130          Type & Model of Pump:  Medtronic Insulin Type: Currently using Humalog   Body mass index is 47.63 kg/m.  PUMP STATISTICS: Average BG: 408 +/- 152 BG Readings: 12 (0.8 / day) Average Daily Carbs (g): 80 +/- 90 Average Total Daily Insulin: 113.54 +/- 68.8 Average Daily Basal: 95.69 (84 %) Average Daily Bolus: 17.85 (16 %)   Thyroid history Has been on levothyroxine for years. Denies previous sx or radioactive ablation.  She is not on biotin.  She is compliant with LT-for replacement  Patient has gained 40 LB's in the past month, she has been on [place] for 2 months.  Patient also has fatigue, blood pressure is difficult to control in the past, she does have abdominal stria that she has noted that they get darker as she is older.   HOME DIABETES REGIMEN: Ozempic 0.5 mg weekly  Humalog- pump    Statin: off for month due to leg cramps  ACE-I/ARB: yes Prior Diabetic Education: yes   METER DOWNLOAD SUMMARY: Date range evaluated: 1/25-2/24/20 Fingerstick Blood Glucose Tests = 23 Average Number Tests/Day = 0.7 Overall Mean FS Glucose =  319 Standard Deviation = 127  BG Ranges: Low = 70 High = 543   Hypoglycemic Events/30 Days: BG < 50 = 0 Episodes of symptomatic severe hypoglycemia = 0   DIABETIC COMPLICATIONS: Microvascular complications:    Denies: retinopathy, CKD, neuropathy  Last eye exam: Completed 2018  Macrovascular complications:   CVA (balance)   Denies: CAD, PVD   PAST HISTORY: Past Medical History:  Past Medical History:  Diagnosis Date  . Anxiety   . Asthma   . Bipolar depression ([place])   . Chronic bronchitis ([place])   . Chronic esophagogastric ulcer   . Chronic stomach ulcer   . Fibroid   . GERD (gastroesophageal reflux disease)   . Heart murmur   . Hepatic steatosis   . History of blood transfusion 2010   "related to subclavian stent"  . History of hiatal hernia   . Hyperlipidemia   . Hypertension   . Hypothyroid   . Migraine    "cerebral migraines;  1-2/month" (06/29/2018)  . 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 Stroke [facility])    "I've had 2; most recent one was ~ 2015 or before; affected balance" (06/29/2018)  . Thalassemia minor   . Type II diabetes mellitus ([place])   . Vitamin D deficiency    Past Surgical History:  Past Surgical History:  Procedure Laterality Date  . ABDOMINAL HYSTERECTOMY  2015   "still have 1 ovary"  . BIOPSY  07/25/2018   Procedure: BIOPSY;  Surgeon: Thornton Park, MD;  Location: [facility] ENDOSCOPY;  Service: Gastroenterology;;  . [place]; 1996  . ESOPHAGOGASTRODUODENOSCOPY (EGD) WITH PROPOFOL N/A 07/25/2018   Procedure: ESOPHAGOGASTRODUODENOSCOPY (EGD) WITH PROPOFOL;  Surgeon: Thornton Park, MD;  Location: [facility];  Service: Gastroenterology;  Laterality: N/A;  . KNEE RECONSTRUCTION Left 1980  . LAPAROSCOPIC CHOLECYSTECTOMY    . SUBCLAVIAN STENT PLACEMENT Left 2010  . TONSILLECTOMY  1970      Social History:  reports that she quit smoking about 4 years ago. Her smoking use included cigarettes. She has a 72.00 pack-year smoking history. She has never used smokeless tobacco. She reports previous alcohol use. She reports previous drug use. Family History:  Family History  Problem Relation Age of Onset  . Diabetes Mother   . Hypertension Mother   . Hyperlipidemia Mother   . CAD Mother   . CAD Father   . Heart failure Father   . Hypertension Sister   . Hyperlipidemia Sister   . Hypertension Brother   . Hyperlipidemia Brother      HOME MEDICATIONS: Allergies as of 09/25/2018      Reactions   Ilosone [erythromycin] Anaphylaxis   Keflex [cephalexin] Anaphylaxis   Penicillins Anaphylaxis   Has patient had a PCN reaction causing immediate rash, facial/tongue/throat swelling, SOB or lightheadedness with hypotension: YES Has patient had a PCN reaction causing severe rash involving mucus membranes or skin necrosis: NO Has patient had a PCN reaction that required  hospitalizationNO Has patient had a PCN reaction occurring within the last 10 years: NO If all of the above answers are "NO", then may proceed with Cephalosporin use.   Iron Other (See Comments)   Pt reports condition limiting Iron intake.   Metformin And Related Other (See Comments)   Pt prefers to not take this medication   Morphine And Related Other (See Comments)   Pt reports hallucinations      Medication List       Accurate as of September 25, 2018  2:26 PM. Always use your most recent med list.        amLODipine 5 MG tablet Commonly known as:  NORVASC Take 1 tablet (5 mg total) by mouth daily.   aspirin 81 MG tablet Take 81 mg by mouth daily.   butalbital-acetaminophen-caffeine 50-325-40 MG tablet Commonly known as:  FIORICET, ESGIC Take 1-2 tablets by mouth every 6 (six) hours as needed for headache or migraine.   CONTOUR NEXT ONE Kit 1 kit by Does not apply route as directed.   doxepin 25 MG capsule Commonly known as:  SINEQUAN Take 50 mg by mouth at bedtime.   EPINEPHrine 0.3 mg/0.3 mL [place] injection Commonly known as:  EPI-PEN Inject 0.3 mg into the muscle once as needed (for an anaphylactic reaction).   famotidine 20 MG tablet Commonly known as:  PEPCID Take 1 tablet (20 mg total) by mouth 2 (two) times daily.   FLUoxetine 20 MG tablet Commonly known as:  PROZAC Take 60 mg by mouth every morning.   GLUCAGON EMERGENCY 1 MG injection Generic drug:  glucagon Inject 1 mg into the vein once as needed (FOR ONSET OF HYPOGLYCEMIA).   insulin pump Soln Inject into the skin continuous. Humalog   LATUDA 60 MG Tabs Generic drug:  Lurasidone HCl Take 60 mg by mouth daily.   levothyroxine 150 MCG tablet Commonly known as:  SYNTHROID, LEVOTHROID Take 150 mcg by mouth daily.   losartan 100 MG tablet Commonly known as:  COZAAR Take 100 mg by mouth daily.   multivitamin with minerals Tabs tablet Take 1 tablet by mouth daily.   nitroGLYCERIN 0.4 MG SL  tablet Commonly known as:  NITROSTAT Place 1 tablet (0.4 mg total) under the tongue every 5 (five) minutes x 3 doses as needed for chest pain.   OZEMPIC (0.25 OR 0.5 MG/DOSE) [place] Inject 0.5 mg into the skin every Monday.   pantoprazole 40 MG tablet Commonly known as:  PROTONIX Take 1 tablet (40 mg total) by mouth 2 (two) times daily before a meal. X 8 weeks   QUEtiapine 50 MG tablet Commonly known as:  SEROQUEL Take 150 mg by mouth at bedtime.   Vitamin D3 50 MCG (2000 UT) Tabs Take 2,000-4,000 Units by mouth See admin instructions. Take 4,000 units by mouth once a day on Mon/Wed/Fri and 2,000 units on Sun/Tues/Thurs/Sat        ALLERGIES: Allergies  Allergen Reactions  . Ilosone [Erythromycin] Anaphylaxis  . Keflex [Cephalexin] Anaphylaxis  . Penicillins Anaphylaxis    Has patient had a PCN reaction causing immediate rash, facial/tongue/throat swelling, SOB or lightheadedness with hypotension: YES Has patient had a PCN reaction causing severe rash involving mucus membranes or skin necrosis: NO Has patient had a PCN reaction that required hospitalizationNO Has patient had a PCN reaction occurring within the last 10 years: NO If all of the above answers are "NO", then may proceed with Cephalosporin use.  . Iron Other (See Comments)    Pt reports condition limiting Iron intake.  . Metformin And Related Other (See Comments)    Pt prefers to not take this medication  . Morphine And Related Other (See Comments)    Pt reports hallucinations     REVIEW OF SYSTEMS: A comprehensive ROS was conducted with the patient and is negative except as per HPI and below:  Review of Systems  Constitutional: Positive for malaise/fatigue. Negative for weight loss.  HENT: Negative for congestion and sore throat.   Eyes: Negative for blurred vision and  pain.  Respiratory: Negative for cough and shortness of breath.   Cardiovascular: Negative for chest pain and palpitations.  Gastrointestinal:  Negative for diarrhea and nausea.  Musculoskeletal: Positive for myalgias.  Skin: Negative.   Neurological: Positive for tingling and tremors.  Endo/Heme/Allergies: Negative for polydipsia.  Psychiatric/Behavioral: Positive for depression. The patient is not nervous/anxious.       OBJECTIVE:   VITAL SIGNS: BP (!) 158/68 (BP Location: Right Arm, Patient Position: Sitting, Cuff Size: Large)   Pulse (!) 113   Ht 5' 2" (1.575 m)   Wt 260 lb 6.4 oz (118.1 kg)   LMP 08/03/2013   SpO2 96%   BMI 47.63 kg/m    PHYSICAL EXAM:  General: Pt appears well and is in NAD  Hydration: Well-hydrated with moist mucous membranes and good skin turgor  HEENT: Head: Unremarkable with good dentition. Oropharynx clear without exudate.  Eyes: External eye exam normal without stare, lid lag or exophthalmos.  EOM intact.  PERRL.  Neck: General: Supple without adenopathy or carotid bruits. Thyroid: Thyroid size normal.  No goiter or nodules appreciated. No thyroid bruit.  Lungs: Clear with good BS bilat with no rales, rhonchi, or wheezes  Heart: RRR with normal S1 and S2 and no gallops; no murmurs; no rub  Abdomen: Normoactive bowel sounds, soft, nontender, without masses or organomegaly palpable .  Multiple abdominal wall stria some of them are pale and some of them are faint purple color.  Extremities:  Lower extremities - No pretibial edema. No lesions.  Skin: Normal texture and temperature to palpation. No rash noted. No Acanthosis nigricans/skin tags.   Neuro: MS is good with appropriate affect, pt is alert and Ox3    DM foot exam:  The skin of the feet is intact without sores or ulcerations. The pedal pulses are 2+ on right and 2+ on left. The sensation is intact to a screening 5.07, 10 gram monofilament bilaterally  DATA REVIEWED:  Lab Results  Component Value Date   HGBA1C 10.4 (H) 07/26/2018   HGBA1C 9.8 (H) 06/27/2018   HGBA1C 13.2 (H) 05/15/2016   Lab Results  Component Value Date    CREATININE 0.85 08/11/2018  Results for SHONTAE, ROSILES (MRN [id number]) as of 09/26/2018 10:49  Ref. Range 09/25/2018 15:05  Total CHOL/HDL Ratio Unknown 5  Cholesterol Latest Ref Range: 0 - 200 mg/dL 190  HDL Cholesterol Latest Ref Range: >39.00 mg/dL 37.50 (L)  Direct LDL Latest Units: mg/dL 144.0  NonHDL Unknown 152.32  Triglycerides Latest Ref Range: 0.0 - 149.0 mg/dL 245.0 (H)  VLDL Latest Ref Range: 0.0 - 40.0 mg/dL 49.0 (H)    ASSESSMENT / PLAN / RECOMMENDATIONS:   1) Type 2 Diabetes Mellitus, Poorly controlled, With microvascular complications - Most recent A1c of 10.4 %. Goal A1c <7.0 %.  Plan: GENERAL: I have discussed with the patient the pathophysiology of diabetes. We went over the natural progression of the disease. We talked about both insulin resistance and insulin deficiency. We stressed the importance of lifestyle changes including diet and exercise. I explained the complications associated with diabetes including retinopathy, nephropathy, neuropathy as well as increased risk of cardiovascular disease. We went over the benefit seen with glycemic control.  Patient has been on the Medtronic pump since 2017, patient does not use her insulin pump as changed initially, basically she just uses it as an insulin carrying device.  Her major barrier to diabetes control is her depression, patient states that when she was on multiple daily injections  of insulin due to her depression symptoms she would not even have the motivation to give herself any insulin, she believes that being on the pump has been a life saving for her because at least she is getting basal insulin. Discussed pharmacokinetics of basal/bolus insulin and the importance of taking prandial insulin with meals.   We also discussed avoiding sugar-sweetened beverages and snacks, when possible.   She also admits that she eats all day long, and she does not have a set mealtimes.  Patient advised that with such eating pattern  there will be no way that we can get glucose under good control unless she is willing to check her glucose before every time she eats as well as enter the amount of carbohydrates every time she eats, but with her historical pattern I doubt that this will happen, it will also cause too much stress and anxiety on her.  I have encouraged her to limit her eating to 3 times a day, so she can avoid eating snacks.  We will set her up to see our CDE, so she can discuss options for CGM, I think this will be helpful in taking care of her diabetes and will take the burden of frequent glucose check from her.  Patient states that her current insulin pump settings have been done by her previous endocrinologist, I am not very sure of this as she is set up to have 8 units/h during the day starting from 9 AM until midnight.  Patient also stated did that her son had manipulated her pump because she was having issues with the cannula notifications.  Patient has a whole lot of insulin resistance which could be explained by poor lifestyle as well as being on the psychiatric medications.  I will adjust her pump settings to a basal of 2.4 from 9 AM to midnight and continue the rest of her basal settings as below, I am also going to tweak her insulin to carb ratio and active insulin time as below.  We will also increase her Ozempic to 1 mg weekly  MEDICATIONS: Increase Ozempic to 1 mg weekly Pump   METRONIC     Insulin type   Humalog     Basal rate       0000-0730  2.4 u/h    0730-0900  2.4 u/h    0900-0000  2.4 u/h       I:C ratio       0000  4.0                  Sensitivity       0000  20      Goal       0800-0000 120   0000 -0800 130  Active insulin time 4 hours    EDUCATION / INSTRUCTIONS:  BG monitoring instructions: Patient is instructed to check her blood sugars 4 times a day, before each meal and bedtime.  Call [facility] if: BG persistently < 70 or > 300. . I reviewed  the Rule of 15 for the treatment of hypoglycemia in detail with the patient. Literature supplied.   2) Diabetic complications:   Eye: Does not have known diabetic retinopathy.   Neuro/ Feet: Does not have known diabetic peripheral neuropathy.  Renal: Patient does not have known baseline CKD. She is on an ACEI/ARB at present.   3) Lipids: Patient used to be on a statin, patient states this was stopped due to muscle cramps but despite stopping  the statin her muscle cramps have continued.  I explained to her that she is a high risk for recurrent CVA, and CAD, I strongly encouraged her to start statin again.  Lipid profile today continues to show high LDL.   Medications   Atorvastatin 10 mg QHS- will start small due to leg cramps    4) Hypertension: She is not  at goal of < 140/90 mmHg.   5) Screening for Cushing syndrome:  -Given 60 LB weight gain in the past month, severe insulin resistance, fatigue, and control blood pressure, and tachycardia I will screen her for Cushing's syndrome through midnight saliva very cortisol x2 -This is most likely related to her antipsychotic medications, but will need to rule out any medical conditions that could contribute or exacerbate her symptoms.  6) Hypothyroidism:    -Patient with multiple nonspecific symptoms that are not attributed to her thyroid. -Biochemically she is euthyroid based on her last TFT results. -Continue levothyroxine 150 MCG daily     Follow-up in 6 weeks    Signed electronically by: Mack Guise, MD  [facility]  [facility] [address], [place], [place] [address] Phone: [phone] FAX: [phone]   CC: Bradly Bienenstock., [facility] [place] [address] Phone: [phone]  Fax: [phone]    Return to Endocrinology clinic as below: No future appointments.

## 2018-09-26 ENCOUNTER — Encounter: Payer: Self-pay | Admitting: Internal Medicine

## 2018-09-26 MED ORDER — ATORVASTATIN CALCIUM 10 MG PO TABS: 10.0000 mg | ORAL_TABLET | Freq: Every day | ORAL | 3 refills | Status: DC

## 2018-10-17 ENCOUNTER — Other Ambulatory Visit: Payer: 59

## 2018-10-17 ENCOUNTER — Encounter: Payer: 59 | Attending: Internal Medicine | Admitting: Nutrition

## 2018-10-17 ENCOUNTER — Other Ambulatory Visit: Payer: Self-pay

## 2018-10-17 DIAGNOSIS — E669 Obesity, unspecified: Secondary | ICD-10-CM | POA: Insufficient documentation

## 2018-10-17 DIAGNOSIS — E1169 Type 2 diabetes mellitus with other specified complication: Secondary | ICD-10-CM | POA: Diagnosis not present

## 2018-10-17 DIAGNOSIS — R635 Abnormal weight gain: Secondary | ICD-10-CM

## 2018-10-20 LAB — SALIVARY CORTISOL X2, TIMED
SALIVARY CORTISOL 2ND SPECIMEN: 0.086 ug/dL
Salivary Cortisol Baseline: 0.084 ug/dL

## 2018-10-24 ENCOUNTER — Telehealth: Payer: Self-pay | Admitting: Internal Medicine

## 2018-10-24 NOTE — Telephone Encounter (Signed)
1. Are you taking any type of steroids? no  2. Are you sick or becoming sick? no  3. Is this the first elevated blood sugar reading? no  4. Have you tried to correct it with insulin? no  5. Have you been taking/taken today all of the prescribed medications? Yes humalog in pump and ozempic  6. What is your current diabetic insulin or oral medication dosage? 57.6 units in pump   Date Time Reading Notes       3/24 am 73   3/24 am 160   3/24 am 210   3/23 pm 75   3/23 am 72   3/23 am 78               Please advise

## 2018-10-24 NOTE — Telephone Encounter (Signed)
Patient is requesting a call back to discuss her low blood sugars.

## 2018-10-24 NOTE — Telephone Encounter (Signed)
Pt aware of instructions, stated she has f/u on 11/07/18 and will see [place] then if no other issues

## 2018-10-25 DIAGNOSIS — Z79890 Hormone replacement therapy (postmenopausal): Secondary | ICD-10-CM

## 2018-10-25 DIAGNOSIS — D563 Thalassemia minor: Secondary | ICD-10-CM | POA: Diagnosis present

## 2018-10-25 DIAGNOSIS — I2510 Atherosclerotic heart disease of native coronary artery without angina pectoris: Secondary | ICD-10-CM | POA: Diagnosis present

## 2018-10-25 DIAGNOSIS — J189 Pneumonia, unspecified organism: Secondary | ICD-10-CM | POA: Diagnosis not present

## 2018-10-25 DIAGNOSIS — Z833 Family history of diabetes mellitus: Secondary | ICD-10-CM

## 2018-10-25 DIAGNOSIS — Z794 Long term (current) use of insulin: Secondary | ICD-10-CM

## 2018-10-25 DIAGNOSIS — K210 Gastro-esophageal reflux disease with esophagitis: Secondary | ICD-10-CM | POA: Diagnosis present

## 2018-10-25 DIAGNOSIS — Z888 Allergy status to other drugs, medicaments and biological substances status: Secondary | ICD-10-CM

## 2018-10-25 DIAGNOSIS — I4581 Long QT syndrome: Secondary | ICD-10-CM | POA: Diagnosis present

## 2018-10-25 DIAGNOSIS — A059 Bacterial foodborne intoxication, unspecified: Secondary | ICD-10-CM | POA: Diagnosis present

## 2018-10-25 DIAGNOSIS — I110 Hypertensive heart disease with heart failure: Secondary | ICD-10-CM | POA: Diagnosis present

## 2018-10-25 DIAGNOSIS — J42 Unspecified chronic bronchitis: Secondary | ICD-10-CM | POA: Diagnosis present

## 2018-10-25 DIAGNOSIS — Z881 Allergy status to other antibiotic agents status: Secondary | ICD-10-CM

## 2018-10-25 DIAGNOSIS — Z8349 Family history of other endocrine, nutritional and metabolic diseases: Secondary | ICD-10-CM

## 2018-10-25 DIAGNOSIS — E039 Hypothyroidism, unspecified: Secondary | ICD-10-CM | POA: Diagnosis present

## 2018-10-25 DIAGNOSIS — Z9641 Presence of insulin pump (external) (internal): Secondary | ICD-10-CM | POA: Diagnosis present

## 2018-10-25 DIAGNOSIS — E669 Obesity, unspecified: Secondary | ICD-10-CM | POA: Diagnosis present

## 2018-10-25 DIAGNOSIS — J45909 Unspecified asthma, uncomplicated: Secondary | ICD-10-CM | POA: Diagnosis present

## 2018-10-25 DIAGNOSIS — F319 Bipolar disorder, unspecified: Secondary | ICD-10-CM | POA: Diagnosis present

## 2018-10-25 DIAGNOSIS — I5023 Acute on chronic systolic (congestive) heart failure: Secondary | ICD-10-CM | POA: Diagnosis not present

## 2018-10-25 DIAGNOSIS — Z6841 Body Mass Index (BMI) 40.0 and over, adult: Secondary | ICD-10-CM

## 2018-10-25 DIAGNOSIS — G43909 Migraine, unspecified, not intractable, without status migrainosus: Secondary | ICD-10-CM | POA: Diagnosis present

## 2018-10-25 DIAGNOSIS — Z7982 Long term (current) use of aspirin: Secondary | ICD-10-CM

## 2018-10-25 DIAGNOSIS — Z8249 Family history of ischemic heart disease and other diseases of the circulatory system: Secondary | ICD-10-CM

## 2018-10-25 DIAGNOSIS — I214 Non-ST elevation (NSTEMI) myocardial infarction: Principal | ICD-10-CM | POA: Diagnosis present

## 2018-10-25 DIAGNOSIS — K760 Fatty (change of) liver, not elsewhere classified: Secondary | ICD-10-CM | POA: Diagnosis present

## 2018-10-25 DIAGNOSIS — R112 Nausea with vomiting, unspecified: Secondary | ICD-10-CM | POA: Diagnosis not present

## 2018-10-25 DIAGNOSIS — Z885 Allergy status to narcotic agent status: Secondary | ICD-10-CM

## 2018-10-25 DIAGNOSIS — Z79899 Other long term (current) drug therapy: Secondary | ICD-10-CM

## 2018-10-25 DIAGNOSIS — Z8673 Personal history of transient ischemic attack (TIA), and cerebral infarction without residual deficits: Secondary | ICD-10-CM

## 2018-10-25 DIAGNOSIS — E876 Hypokalemia: Secondary | ICD-10-CM | POA: Diagnosis present

## 2018-10-25 DIAGNOSIS — E785 Hyperlipidemia, unspecified: Secondary | ICD-10-CM | POA: Diagnosis present

## 2018-10-25 DIAGNOSIS — E1110 Type 2 diabetes mellitus with ketoacidosis without coma: Secondary | ICD-10-CM | POA: Diagnosis present

## 2018-10-25 DIAGNOSIS — Z880 Allergy status to penicillin: Secondary | ICD-10-CM

## 2018-10-25 DIAGNOSIS — J9601 Acute respiratory failure with hypoxia: Secondary | ICD-10-CM | POA: Diagnosis not present

## 2018-10-25 DIAGNOSIS — Z87891 Personal history of nicotine dependence: Secondary | ICD-10-CM

## 2018-10-26 ENCOUNTER — Other Ambulatory Visit: Payer: Self-pay

## 2018-10-26 ENCOUNTER — Observation Stay (HOSPITAL_BASED_OUTPATIENT_CLINIC_OR_DEPARTMENT_OTHER): Payer: 59

## 2018-10-26 ENCOUNTER — Encounter (HOSPITAL_COMMUNITY): Payer: Self-pay

## 2018-10-26 ENCOUNTER — Inpatient Hospital Stay (HOSPITAL_COMMUNITY)
Admission: EM | Admit: 2018-10-26 | Discharge: 2018-10-31 | DRG: 246 | Disposition: A | Discharge status: Home / Self Care | Payer: 59 | Attending: Internal Medicine | Admitting: Internal Medicine

## 2018-10-26 DIAGNOSIS — R112 Nausea with vomiting, unspecified: Secondary | ICD-10-CM

## 2018-10-26 DIAGNOSIS — E669 Obesity, unspecified: Secondary | ICD-10-CM

## 2018-10-26 DIAGNOSIS — I5021 Acute systolic (congestive) heart failure: Secondary | ICD-10-CM

## 2018-10-26 DIAGNOSIS — E785 Hyperlipidemia, unspecified: Secondary | ICD-10-CM

## 2018-10-26 DIAGNOSIS — E1169 Type 2 diabetes mellitus with other specified complication: Secondary | ICD-10-CM | POA: Diagnosis present

## 2018-10-26 DIAGNOSIS — R9431 Abnormal electrocardiogram [ECG] [EKG]: Secondary | ICD-10-CM

## 2018-10-26 DIAGNOSIS — J189 Pneumonia, unspecified organism: Secondary | ICD-10-CM

## 2018-10-26 DIAGNOSIS — E876 Hypokalemia: Secondary | ICD-10-CM

## 2018-10-26 DIAGNOSIS — R7989 Other specified abnormal findings of blood chemistry: Secondary | ICD-10-CM | POA: Diagnosis not present

## 2018-10-26 DIAGNOSIS — R079 Chest pain, unspecified: Secondary | ICD-10-CM

## 2018-10-26 DIAGNOSIS — R778 Other specified abnormalities of plasma proteins: Secondary | ICD-10-CM

## 2018-10-26 DIAGNOSIS — R0902 Hypoxemia: Secondary | ICD-10-CM

## 2018-10-26 DIAGNOSIS — F319 Bipolar disorder, unspecified: Secondary | ICD-10-CM | POA: Diagnosis not present

## 2018-10-26 DIAGNOSIS — E039 Hypothyroidism, unspecified: Secondary | ICD-10-CM | POA: Diagnosis present

## 2018-10-26 DIAGNOSIS — I214 Non-ST elevation (NSTEMI) myocardial infarction: Secondary | ICD-10-CM

## 2018-10-26 DIAGNOSIS — Z955 Presence of coronary angioplasty implant and graft: Secondary | ICD-10-CM

## 2018-10-26 LAB — CBC WITH DIFFERENTIAL/PLATELET
Abs Immature Granulocytes: 0 10*3/uL (ref 0.00–0.07)
Band Neutrophils: 10 %
Basophils Absolute: 0.1 10*3/uL (ref 0.0–0.1)
Basophils Relative: 1 %
Eosinophils Absolute: 0 10*3/uL (ref 0.0–0.5)
Eosinophils Relative: 0 %
HCT: 46.6 % — ABNORMAL HIGH (ref 36.0–46.0)
Hemoglobin: 14.2 g/dL (ref 12.0–15.0)
Lymphocytes Relative: 6 %
Lymphs Abs: 0.9 10*3/uL (ref 0.7–4.0)
MCH: 19.7 pg — ABNORMAL LOW (ref 26.0–34.0)
MCHC: 30.5 g/dL (ref 30.0–36.0)
MCV: 64.5 fL — ABNORMAL LOW (ref 80.0–100.0)
MONOS PCT: 1 %
Monocytes Absolute: 0.1 10*3/uL (ref 0.1–1.0)
NEUTROS ABS: 13.5 10*3/uL — AB (ref 1.7–7.7)
Neutrophils Relative %: 82 %
Platelets: 317 10*3/uL (ref 150–400)
RBC: 7.22 MIL/uL — ABNORMAL HIGH (ref 3.87–5.11)
RDW: 17.2 % — ABNORMAL HIGH (ref 11.5–15.5)
WBC: 14.7 10*3/uL — ABNORMAL HIGH (ref 4.0–10.5)
nRBC: 0 % (ref 0.0–0.2)
nRBC: 0 /100 WBC

## 2018-10-26 LAB — BETA-HYDROXYBUTYRIC ACID: Beta-Hydroxybutyric Acid: 0.42 mmol/L — ABNORMAL HIGH (ref 0.05–0.27)

## 2018-10-26 LAB — COMPREHENSIVE METABOLIC PANEL
ALT: 31 U/L (ref 0–44)
AST: 48 U/L — AB (ref 15–41)
Albumin: 3.7 g/dL (ref 3.5–5.0)
Alkaline Phosphatase: 69 U/L (ref 38–126)
Anion gap: 18 — ABNORMAL HIGH (ref 5–15)
BUN: <5 mg/dL — ABNORMAL LOW (ref 6–20)
CO2: 22 mmol/L (ref 22–32)
Calcium: 8.7 mg/dL — ABNORMAL LOW (ref 8.9–10.3)
Chloride: 98 mmol/L (ref 98–111)
Creatinine, Ser: 0.71 mg/dL (ref 0.44–1.00)
GFR calc Af Amer: >60 mL/min (ref >60)
GFR calc non Af Amer: >60 mL/min (ref >60)
Glucose, Bld: 220 mg/dL — ABNORMAL HIGH (ref 70–99)
Potassium: 2.7 mmol/L — CL (ref 3.5–5.1)
Sodium: 138 mmol/L (ref 135–145)
Total Bilirubin: 1.1 mg/dL (ref 0.3–1.2)
Total Protein: 7.1 g/dL (ref 6.5–8.1)

## 2018-10-26 LAB — TROPONIN I
TROPONIN I: 1 ng/mL — AB (ref <0.03)
Troponin I: 0.67 ng/mL (ref <0.03)
Troponin I: 0.98 ng/mL (ref <0.03)
Troponin I: 0.9 ng/mL (ref <0.03)

## 2018-10-26 LAB — BASIC METABOLIC PANEL
Anion gap: 14 (ref 5–15)
Anion gap: 14 (ref 5–15)
Anion gap: 12 (ref 5–15)
Anion gap: 8 (ref 5–15)
BUN: <5 mg/dL — ABNORMAL LOW (ref 6–20)
BUN: <5 mg/dL — ABNORMAL LOW (ref 6–20)
BUN: <5 mg/dL — ABNORMAL LOW (ref 6–20)
BUN: <5 mg/dL — ABNORMAL LOW (ref 6–20)
CALCIUM: 8 mg/dL — AB (ref 8.9–10.3)
CO2: 24 mmol/L (ref 22–32)
CO2: 25 mmol/L (ref 22–32)
CO2: 24 mmol/L (ref 22–32)
CO2: 27 mmol/L (ref 22–32)
CREATININE: 0.69 mg/dL (ref 0.44–1.00)
Calcium: 8.2 mg/dL — ABNORMAL LOW (ref 8.9–10.3)
Calcium: 8.2 mg/dL — ABNORMAL LOW (ref 8.9–10.3)
Calcium: 8.1 mg/dL — ABNORMAL LOW (ref 8.9–10.3)
Chloride: 100 mmol/L (ref 98–111)
Chloride: 100 mmol/L (ref 98–111)
Chloride: 102 mmol/L (ref 98–111)
Chloride: 103 mmol/L (ref 98–111)
Creatinine, Ser: 0.65 mg/dL (ref 0.44–1.00)
Creatinine, Ser: 0.66 mg/dL (ref 0.44–1.00)
Creatinine, Ser: 0.74 mg/dL (ref 0.44–1.00)
GFR calc Af Amer: >60 mL/min (ref >60)
GFR calc Af Amer: >60 mL/min (ref >60)
GFR calc Af Amer: >60 mL/min (ref >60)
GFR calc Af Amer: >60 mL/min (ref >60)
GFR calc non Af Amer: >60 mL/min (ref >60)
GFR calc non Af Amer: >60 mL/min (ref >60)
GFR calc non Af Amer: >60 mL/min (ref >60)
GFR calc non Af Amer: >60 mL/min (ref >60)
GLUCOSE: 231 mg/dL — AB (ref 70–99)
Glucose, Bld: 180 mg/dL — ABNORMAL HIGH (ref 70–99)
Glucose, Bld: 226 mg/dL — ABNORMAL HIGH (ref 70–99)
Glucose, Bld: 215 mg/dL — ABNORMAL HIGH (ref 70–99)
POTASSIUM: 3.1 mmol/L — AB (ref 3.5–5.1)
Potassium: 2.9 mmol/L — ABNORMAL LOW (ref 3.5–5.1)
Potassium: 3.5 mmol/L (ref 3.5–5.1)
Potassium: 3.3 mmol/L — ABNORMAL LOW (ref 3.5–5.1)
Sodium: 138 mmol/L (ref 135–145)
Sodium: 139 mmol/L (ref 135–145)
Sodium: 138 mmol/L (ref 135–145)
Sodium: 138 mmol/L (ref 135–145)

## 2018-10-26 LAB — URINALYSIS, ROUTINE W REFLEX MICROSCOPIC
Bacteria, UA: NONE SEEN
Bilirubin Urine: NEGATIVE
Glucose, UA: >=500 mg/dL — AB
Hgb urine dipstick: NEGATIVE
Ketones, ur: 20 mg/dL — AB
Leukocytes,Ua: NEGATIVE
Nitrite: NEGATIVE
PROTEIN: 30 mg/dL — AB
Specific Gravity, Urine: 1.008 (ref 1.005–1.030)
pH: 7 (ref 5.0–8.0)

## 2018-10-26 LAB — MAGNESIUM: Magnesium: 2.2 mg/dL (ref 1.7–2.4)

## 2018-10-26 LAB — GLUCOSE, CAPILLARY
GLUCOSE-CAPILLARY: 198 mg/dL — AB (ref 70–99)
Glucose-Capillary: 181 mg/dL — ABNORMAL HIGH (ref 70–99)
Glucose-Capillary: 206 mg/dL — ABNORMAL HIGH (ref 70–99)
Glucose-Capillary: 253 mg/dL — ABNORMAL HIGH (ref 70–99)

## 2018-10-26 LAB — CBG MONITORING, ED: Glucose-Capillary: 249 mg/dL — ABNORMAL HIGH (ref 70–99)

## 2018-10-26 LAB — ECHOCARDIOGRAM COMPLETE
Height: 62.5 in
WEIGHTICAEL: 3660.8 [oz_av]

## 2018-10-26 LAB — HEPARIN LEVEL (UNFRACTIONATED): Heparin Unfractionated: <0.1 IU/mL — ABNORMAL LOW (ref 0.30–0.70)

## 2018-10-26 MED ORDER — METOCLOPRAMIDE HCL 5 MG/ML IJ SOLN
10.0000 mg | Freq: Once | INTRAMUSCULAR | Status: AC
  Administered 2018-10-26: 10 mg via INTRAVENOUS
  Filled 2018-10-26: qty 2

## 2018-10-26 MED ORDER — INSULIN DETEMIR 100 UNIT/ML ~~LOC~~ SOLN
15.0000 [IU] | Freq: Two times a day (BID) | SUBCUTANEOUS | Status: DC
  Administered 2018-10-26 – 2018-10-31 (×10): 15 [IU] via SUBCUTANEOUS
  Filled 2018-10-26 (×11): qty 0.15

## 2018-10-26 MED ORDER — LEVOTHYROXINE SODIUM 75 MCG PO TABS
150.0000 ug | ORAL_TABLET | Freq: Every day | ORAL | Status: DC
  Administered 2018-10-26 – 2018-10-31 (×6): 150 ug via ORAL
  Filled 2018-10-26 (×6): qty 2

## 2018-10-26 MED ORDER — ENOXAPARIN SODIUM 40 MG/0.4ML ~~LOC~~ SOLN: 40.0000 mg | Freq: Every day | SUBCUTANEOUS | Status: DC

## 2018-10-26 MED ORDER — METOPROLOL TARTRATE 25 MG PO TABS
25.0000 mg | ORAL_TABLET | Freq: Two times a day (BID) | ORAL | Status: DC
  Administered 2018-10-26: 25 mg via ORAL
  Filled 2018-10-26: qty 1

## 2018-10-26 MED ORDER — POTASSIUM CHLORIDE 10 MEQ/100ML IV SOLN
10.0000 meq | INTRAVENOUS | Status: AC
  Administered 2018-10-26 (×3): 10 meq via INTRAVENOUS
  Filled 2018-10-26 (×4): qty 100

## 2018-10-26 MED ORDER — LOSARTAN POTASSIUM 25 MG PO TABS
25.0000 mg | ORAL_TABLET | Freq: Every day | ORAL | Status: DC
  Administered 2018-10-26 – 2018-10-28 (×3): 25 mg via ORAL
  Filled 2018-10-26 (×3): qty 1

## 2018-10-26 MED ORDER — POTASSIUM CHLORIDE IN NACL 40-0.9 MEQ/L-% IV SOLN
INTRAVENOUS | Status: DC
  Administered 2018-10-26 – 2018-10-27 (×4): 100 mL/h via INTRAVENOUS
  Filled 2018-10-26 (×6): qty 1000

## 2018-10-26 MED ORDER — HYDRALAZINE HCL 20 MG/ML IJ SOLN
5.0000 mg | Freq: Four times a day (QID) | INTRAMUSCULAR | Status: DC | PRN
  Administered 2018-10-27: 5 mg via INTRAVENOUS
  Filled 2018-10-26 (×2): qty 1

## 2018-10-26 MED ORDER — MAGNESIUM SULFATE 2 GM/50ML IV SOLN
2.0000 g | Freq: Once | INTRAVENOUS | Status: AC
  Administered 2018-10-26: 2 g via INTRAVENOUS
  Filled 2018-10-26: qty 50

## 2018-10-26 MED ORDER — SODIUM CHLORIDE 0.9 % IV BOLUS
1000.0000 mL | Freq: Once | INTRAVENOUS | Status: AC
  Administered 2018-10-26: 1000 mL via INTRAVENOUS

## 2018-10-26 MED ORDER — PROMETHAZINE HCL 25 MG/ML IJ SOLN
12.5000 mg | Freq: Four times a day (QID) | INTRAMUSCULAR | Status: DC | PRN
  Administered 2018-10-26 – 2018-10-30 (×12): 12.5 mg via INTRAVENOUS
  Filled 2018-10-26 (×12): qty 1

## 2018-10-26 MED ORDER — INSULIN ASPART 100 UNIT/ML ~~LOC~~ SOLN
0.0000 [IU] | SUBCUTANEOUS | Status: DC
  Administered 2018-10-26: 3 [IU] via SUBCUTANEOUS
  Administered 2018-10-26 (×2): 2 [IU] via SUBCUTANEOUS
  Administered 2018-10-26 – 2018-10-27 (×2): 5 [IU] via SUBCUTANEOUS
  Administered 2018-10-27: 2 [IU] via SUBCUTANEOUS
  Administered 2018-10-27 (×2): 1 [IU] via SUBCUTANEOUS
  Administered 2018-10-28: 5 [IU] via SUBCUTANEOUS
  Administered 2018-10-28 (×4): 3 [IU] via SUBCUTANEOUS
  Administered 2018-10-28: 2 [IU] via SUBCUTANEOUS
  Administered 2018-10-29: 3 [IU] via SUBCUTANEOUS
  Administered 2018-10-29: 2 [IU] via SUBCUTANEOUS
  Administered 2018-10-29: 3 [IU] via SUBCUTANEOUS
  Administered 2018-10-29: 2 [IU] via SUBCUTANEOUS
  Administered 2018-10-29: 1 [IU] via SUBCUTANEOUS
  Administered 2018-10-30: 3 [IU] via SUBCUTANEOUS
  Administered 2018-10-30: 2 [IU] via SUBCUTANEOUS
  Administered 2018-10-30: 3 [IU] via SUBCUTANEOUS

## 2018-10-26 MED ORDER — LURASIDONE HCL 20 MG PO TABS
60.0000 mg | ORAL_TABLET | Freq: Every day | ORAL | Status: DC
  Administered 2018-10-26 – 2018-10-31 (×6): 60 mg via ORAL
  Filled 2018-10-26 (×6): qty 3

## 2018-10-26 MED ORDER — METOPROLOL TARTRATE 50 MG PO TABS
50.0000 mg | ORAL_TABLET | Freq: Two times a day (BID) | ORAL | Status: DC
  Administered 2018-10-26 – 2018-10-31 (×10): 50 mg via ORAL
  Filled 2018-10-26 (×10): qty 1

## 2018-10-26 MED ORDER — HEPARIN BOLUS VIA INFUSION
4000.0000 [IU] | Freq: Once | INTRAVENOUS | Status: AC
  Administered 2018-10-26: 4000 [IU] via INTRAVENOUS
  Filled 2018-10-26: qty 4000

## 2018-10-26 MED ORDER — ASPIRIN 81 MG PO CHEW
324.0000 mg | CHEWABLE_TABLET | Freq: Once | ORAL | Status: AC
  Administered 2018-10-26: 324 mg via ORAL
  Filled 2018-10-26: qty 4

## 2018-10-26 MED ORDER — ATORVASTATIN CALCIUM 10 MG PO TABS
10.0000 mg | ORAL_TABLET | Freq: Every day | ORAL | Status: DC
  Administered 2018-10-26 – 2018-10-28 (×3): 10 mg via ORAL
  Filled 2018-10-26 (×3): qty 1

## 2018-10-26 MED ORDER — QUETIAPINE FUMARATE 50 MG PO TABS: 150.0000 mg | ORAL_TABLET | Freq: Every day | ORAL | Status: DC

## 2018-10-26 MED ORDER — ACETAMINOPHEN 650 MG RE SUPP: 650.0000 mg | Freq: Four times a day (QID) | RECTAL | Status: DC | PRN

## 2018-10-26 MED ORDER — ONDANSETRON HCL 4 MG/2ML IJ SOLN
4.0000 mg | Freq: Four times a day (QID) | INTRAMUSCULAR | Status: DC | PRN
  Administered 2018-10-26 – 2018-10-30 (×10): 4 mg via INTRAVENOUS
  Filled 2018-10-26 (×10): qty 2

## 2018-10-26 MED ORDER — ONDANSETRON HCL 4 MG/2ML IJ SOLN
4.0000 mg | Freq: Once | INTRAMUSCULAR | Status: AC
  Administered 2018-10-26: 4 mg via INTRAVENOUS
  Filled 2018-10-26: qty 2

## 2018-10-26 MED ORDER — ASPIRIN EC 81 MG PO TBEC
81.0000 mg | DELAYED_RELEASE_TABLET | Freq: Every day | ORAL | Status: DC
  Administered 2018-10-28 – 2018-10-31 (×4): 81 mg via ORAL
  Filled 2018-10-26 (×5): qty 1

## 2018-10-26 MED ORDER — ACETAMINOPHEN 325 MG PO TABS
650.0000 mg | ORAL_TABLET | Freq: Four times a day (QID) | ORAL | Status: DC | PRN
  Administered 2018-10-28 – 2018-10-30 (×8): 650 mg via ORAL
  Filled 2018-10-26 (×8): qty 2

## 2018-10-26 MED ORDER — HEPARIN (PORCINE) 25000 UT/250ML-% IV SOLN
1100.0000 [IU]/h | INTRAVENOUS | Status: DC
  Administered 2018-10-26: 900 [IU]/h via INTRAVENOUS
  Filled 2018-10-26: qty 250

## 2018-10-26 NOTE — Progress Notes (Addendum)
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

## 2018-10-26 NOTE — ED Provider Notes (Addendum)
[facility] EMERGENCY DEPARTMENT Provider Note   CSN: [id number] Arrival date & time: 10/25/18  2359    History   Chief Complaint No chief complaint on file.   HPI Morgan [name] is a 53 y.o. female.  The history is provided by the patient.  She has history of diabetes, hypertension, hyperlipidemia and comes in with vomiting since 6 PM.  She had eaten at [place] at about 4 PM.  She initially had one episode of diarrhea but no longer feels like she is going to have more diarrhea.  She has vomited multiple times and is now having dry heaves.  There is soreness in the epigastric area without radiation.  She denies fever, chills, sweats.  Her son had eaten at the same restaurant and is having some diarrhea.  Past Medical History:  Diagnosis Date  . Anxiety   . Asthma   . Bipolar depression ([place])   . Chronic bronchitis ([place])   . Chronic esophagogastric ulcer   . Chronic stomach ulcer   . Fibroid   . GERD (gastroesophageal reflux disease)   . Heart murmur   . Hepatic steatosis   . History of blood transfusion 2010   "related to subclavian stent"  . History of hiatal hernia   . Hyperlipidemia   . Hypertension   . Hypothyroid   . Migraine    "cerebral migraines; 1-2/month" (06/29/2018)  . 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 Stroke [facility])    "I've had 2; most recent one was ~ 2015 or before; affected balance" (06/29/2018)  . Thalassemia minor   . Type II diabetes mellitus ([place])   . Vitamin D deficiency     Patient Active Problem List   Diagnosis Date Noted  . Nausea and vomiting 08/09/2018  . Elevated troponin 08/09/2018  . Esophagitis determined by endoscopy   . Dysphagia 07/24/2018  . Odynophagia 07/23/2018  . Orthostatic hypotension 07/22/2018  . Tachycardia 07/22/2018  . Hepatic steatosis   . Elevated transaminase level 07/21/2018  . Mild renal insufficiency 07/21/2018  . Leukocytosis 07/21/2018  . Bipolar depression ([place]) 07/21/2018   . Incidental lung nodule 07/21/2018  . Extravasation accident, initial encounter 07/21/2018  . Prolonged QT interval 07/21/2018  . LFTs abnormal   . Sepsis ([place]) 06/27/2018  . Hypokalemia 06/27/2018  . Diabetes mellitus type 2 in obese ([place]) 06/27/2018  . AKI (acute kidney injury) ([place]) 06/27/2018  . Chest pain, rule out acute myocardial infarction 05/14/2016  . Chest pain 05/14/2016  . Hyperlipidemia 05/14/2016  . Hypothyroidism 05/14/2016  . Essential hypertension 05/14/2016    Past Surgical History:  Procedure Laterality Date  . ABDOMINAL HYSTERECTOMY  2015   "still have 1 ovary"  . BIOPSY  07/25/2018   Procedure: BIOPSY;  Surgeon: Thornton Park, MD;  Location: [facility] ENDOSCOPY;  Service: Gastroenterology;;  . Elim; 1996  . ESOPHAGOGASTRODUODENOSCOPY (EGD) WITH PROPOFOL N/A 07/25/2018   Procedure: ESOPHAGOGASTRODUODENOSCOPY (EGD) WITH PROPOFOL;  Surgeon: Thornton Park, MD;  Location: [place];  Service: Gastroenterology;  Laterality: N/A;  . KNEE RECONSTRUCTION Left 1980  . LAPAROSCOPIC CHOLECYSTECTOMY    . SUBCLAVIAN STENT PLACEMENT Left 2010  . TONSILLECTOMY  1970     OB History    Gravida  6   Para  2   Term  2   Preterm      AB  4   Living  2     SAB  4   TAB  Ectopic      Multiple      Live Births               Home Medications    Prior to Admission medications   Medication Sig Start Date End Date Taking? Authorizing Provider  amLODipine (NORVASC) 5 MG tablet Take 1 tablet (5 mg total) by mouth daily. Patient not taking: Reported on 09/25/2018 08/11/18 08/11/19  Jonetta Osgood, MD  aspirin 81 MG tablet Take 81 mg by mouth daily.     [provider]  atorvastatin (LIPITOR) 10 MG tablet Take 1 tablet (10 mg total) by mouth daily. 09/26/18   Shamleffer, Melanie Crazier, MD  Blood Glucose Monitoring Suppl (CONTOUR NEXT ONE) KIT 1 kit by Does not apply route as directed. 07/02/18   Patrecia Pour, MD   butalbital-acetaminophen-caffeine (FIORICET, ESGIC) [id number] MG tablet Take 1-2 tablets by mouth every 6 (six) hours as needed for headache or migraine. 07/26/18 07/26/19  Rai, Ripudeep Raliegh Ip, MD  Cholecalciferol (VITAMIN D3) 50 MCG (2000 UT) TABS Take 2,000-4,000 Units by mouth See admin instructions. Take 4,000 units by mouth once a day on Mon/Wed/Fri and 2,000 units on Sun/Tues/Thurs/Sat    [provider]  doxepin (SINEQUAN) 25 MG capsule Take 50 mg by mouth at bedtime.     [provider]  EPINEPHrine 0.3 mg/0.3 mL IJ SOAJ injection Inject 0.3 mg into the muscle once as needed (for an anaphylactic reaction).  02/08/18   [provider]  famotidine (PEPCID) 20 MG tablet Take 1 tablet (20 mg total) by mouth 2 (two) times daily. Patient not taking: Reported on 09/25/2018 07/26/18   Rai, Vernelle Emerald, MD  FLUoxetine (PROZAC) 20 MG tablet Take 60 mg by mouth every morning.     [provider]  GLUCAGON EMERGENCY 1 MG injection Inject 1 mg into the vein once as needed (FOR ONSET OF HYPOGLYCEMIA).  02/09/18   [provider]  Insulin Human (INSULIN PUMP) SOLN Inject into the skin continuous. Humalog    [provider]  levothyroxine (SYNTHROID, LEVOTHROID) 150 MCG tablet Take 150 mcg by mouth daily.     [provider]  losartan (COZAAR) 100 MG tablet Take 100 mg by mouth daily.    [provider]  Lurasidone HCl (LATUDA) 60 MG TABS Take 60 mg by mouth daily.    [provider]  Multiple Vitamin (MULTIVITAMIN WITH MINERALS) TABS tablet Take 1 tablet by mouth daily.    [provider]  nitroGLYCERIN (NITROSTAT) 0.4 MG SL tablet Place 1 tablet (0.4 mg total) under the tongue every 5 (five) minutes x 3 doses as needed for chest pain. 07/26/18   Rai, Vernelle Emerald, MD  pantoprazole (PROTONIX) 40 MG tablet Take 1 tablet (40 mg total) by mouth 2 (two) times daily before a meal. X 8 weeks 07/26/18   Rai, Ripudeep K, MD  QUEtiapine  (SEROQUEL) 50 MG tablet Take 150 mg by mouth at bedtime.    [provider]  Semaglutide, 1 MG/DOSE, (OZEMPIC, 1 MG/DOSE,) 2 MG/1.5ML SOPN Inject 1 mg into the skin once a week. 09/25/18   Shamleffer, Melanie Crazier, MD    Family History Family History  Problem Relation Age of Onset  . Diabetes Mother   . Hypertension Mother   . Hyperlipidemia Mother   . CAD Mother   . CAD Father   . Heart failure Father   . Hypertension Sister   . Hyperlipidemia Sister   . Hypertension Brother   .  Hyperlipidemia Brother     Social History Social History   Tobacco Use  . Smoking status: Former Smoker    Packs/day: 2.00    Years: 36.00    Pack years: 72.00    Types: Cigarettes    Last attempt to quit: 05/14/2014    Years since quitting: 4.4  . Smokeless tobacco: Never Used  Substance Use Topics  . Alcohol use: Not Currently  . Drug use: Not Currently     Allergies   Ilosone [erythromycin]; Keflex [cephalexin]; Penicillins; Iron; Metformin and related; and Morphine and related   Review of Systems Review of Systems  All other systems reviewed and are negative.    Physical Exam Updated Vital Signs BP (!) 177/100   Pulse (!) 113   Temp 98.6 F (37 C) (Oral)   Resp 18   LMP 08/03/2013   SpO2 97%   Physical Exam Vitals signs and nursing note reviewed.    53 year old female, resting comfortably and in no acute distress. Vital signs are significant for elevated blood pressure and elevated heart rate. Oxygen saturation is 97%, which is normal. Head is normocephalic and atraumatic. PERRLA, EOMI. Oropharynx is clear. Neck is nontender and supple without adenopathy or JVD. Back is nontender and there is no CVA tenderness. Lungs are clear without rales, wheezes, or rhonchi. Chest is nontender. Heart has regular rate and rhythm without murmur. Abdomen is soft, flat, nontender without masses or hepatosplenomegaly and peristalsis is hypoactive. Extremities have no cyanosis  or edema, full range of motion is present. Skin is warm and dry without rash. Neurologic: Mental status is normal, cranial nerves are intact, there are no motor or sensory deficits.  ED Treatments / Results  Labs (all labs ordered are listed, but only abnormal results are displayed) Labs Reviewed  CBC WITH DIFFERENTIAL/PLATELET - Abnormal; Notable for the following components:      Result Value   WBC 14.7 (*)    RBC 7.22 (*)    HCT 46.6 (*)    MCV 64.5 (*)    MCH 19.7 (*)    RDW 17.2 (*)    Neutro Abs 13.5 (*)    All other components within normal limits  URINALYSIS, ROUTINE W REFLEX MICROSCOPIC - Abnormal; Notable for the following components:   Color, Urine STRAW (*)    Glucose, UA >=500 (*)    Ketones, ur 20 (*)    Protein, ur 30 (*)    All other components within normal limits  COMPREHENSIVE METABOLIC PANEL - Abnormal; Notable for the following components:   Potassium 2.7 (*)    Glucose, Bld 220 (*)    BUN <5 (*)    Calcium 8.7 (*)    AST 48 (*)    Anion gap 18 (*)    All other components within normal limits  TROPONIN I - Abnormal; Notable for the following components:   Troponin I 0.67 (*)    All other components within normal limits  CBG MONITORING, ED - Abnormal; Notable for the following components:   Glucose-Capillary 249 (*)    All other components within normal limits  MAGNESIUM  TROPONIN I  TROPONIN I    EKG EKG Interpretation  Date/Time:  Thursday October 26 2018 00:27:28 EDT Ventricular Rate:  109 PR Interval:    QRS Duration: 84 QT Interval:  391 QTC Calculation: 527 R Axis:   72 Text Interpretation:  Sinus tachycardia Consider right atrial enlargement Probable anteroseptal infarct, recent Lateral leads are also involved Prolonged  QT interval When compared with ECG of 08/11/2018, QT has lengthened Anteroseptal infarct , age undetermined is now present Confirmed by Delora Fuel ([id number]) on 10/26/2018 12:38:02 AM   Procedures Procedures  CRITICAL  CARE Performed by: Delora Fuel Total critical care time: 35 minutes Critical care time was exclusive of separately billable procedures and treating other patients. Critical care was necessary to treat or prevent imminent or life-threatening deterioration. Critical care was time spent personally by me on the following activities: development of treatment plan with patient and/or surrogate as well as nursing, discussions with consultants, evaluation of patient's response to treatment, examination of patient, obtaining history from patient or surrogate, ordering and performing treatments and interventions, ordering and review of laboratory studies, ordering and review of radiographic studies, pulse oximetry and re-evaluation of patient's condition.  Medications Ordered in ED Medications  potassium chloride 10 mEq in 100 mL IVPB (10 mEq Intravenous New Bag/Given 10/26/18 0348)  sodium chloride 0.9 % bolus 1,000 mL (1,000 mLs Intravenous New Bag/Given 10/26/18 0024)  ondansetron (ZOFRAN) injection 4 mg (4 mg Intravenous Given 10/26/18 0025)  magnesium sulfate IVPB 2 g 50 mL (0 g Intravenous Stopped 10/26/18 0225)  metoCLOPramide (REGLAN) injection 10 mg (10 mg Intravenous Given 10/26/18 0224)  aspirin chewable tablet 324 mg (324 mg Oral Given 10/26/18 0349)  sodium chloride 0.9 % bolus 1,000 mL (1,000 mLs Intravenous New Bag/Given 10/26/18 0347)     Initial Impression / Assessment and Plan / ED Course  I have reviewed the triage vital signs and the nursing notes.  Pertinent labs & imaging results that were available during my care of the patient were reviewed by me and considered in my medical decision making (see chart for details).  Nausea, vomiting, diarrhea which is most likely viral gastroenteritis, but consider food poisoning, ketoacidosis.  Doubt bowel obstruction.  Old records are reviewed, and all recent ECGs showed normal to minimally prolonged QTC, so it is felt safe to give her ondansetron.   She is also given IV fluids.  ECG also shows new Q waves in anterior leads with slight ST elevation in the same leads.  Will add troponin to baseline labs.  Nausea eventually settled with ondansetron plus metoclopramide.  Potassium is come back very low at 2.7 and she is given intravenous potassium.  This is felt to be due to episodes of vomiting.  Troponin has come back elevated at 0.67.  Clinical picture is suggestive of recent myocardial infarction.  She has no chest pain today.  She is given a dose of aspirin.  I have discussed the case with Dr. Elson Areas of cardiology service who is in agreement.  He has reviewed the ECG and the chart.  He will arrange for formal consultation in the morning.  He is also in agreement for not starting heparin, since patient is not having ongoing chest pain.  Case is discussed with Dr. Aurora Mask of [facility] who agrees to admit the patient.  Final Clinical Impressions(s) / ED Diagnoses   Final diagnoses:  Non-intractable vomiting with nausea, unspecified vomiting type  Hypokalemia due to excessive gastrointestinal loss of potassium  Elevated troponin I level  EKG abnormality    ED Discharge Orders    None       Delora Fuel, MD 11/94/17 [id number]    Delora Fuel, MD 44/81/85 [phone]

## 2018-10-26 NOTE — ED Triage Notes (Signed)
Pt states that n/v/d started this afternoon after eating a chicken filet, pt is diabetic. Denies fevers

## 2018-10-26 NOTE — Consult Note (Addendum)
Cardiology Consultation:   Patient ID: Morgan [name] MRN: [id number]; DOB: [birth year]  Admit date: 10/26/2018 Date of Consult: 10/26/2018  Primary Care Provider: Bradly Bienenstock., MD Primary Cardiologist: Jenkins Rouge, MD  Primary Electrophysiologist:  None    Patient Profile:   Morgan [name] is a 53 y.o. female with a hx of HLD, HTN, right lung nodule, insulin-dependent DM2, and recent history of odynophagia/dysphagia who is being seen today for the evaluation of elevated troponin at the request of Dr. Sloan Leiter.  History of Present Illness:   Morgan [name] has not followed with cardiology previously. She had a normal nuclear stress test in 2017 for chest pain. She presented to [facility] with one day of nausea, vomiting, and diarrhea. Pt stated she thought her symptoms were due to food poisoning. She was afebrile with sinus tachycardia in the 110s and hypertensive in the 947M systolic. Initial troponin was 0.67 and EKG significant for new Q waves in V1/2. She was admitted to Medicine service and cardiology consulted. Repeat troponin was 1.00.  Of note, she was seen in the hospital in January 2020 with similar GI symptoms and found to have a mildly elevated troponin. Cardiology was not consulted.  She had no history of CAD. Seen by heart doctor ? At [facility] 3 years ago with stress test negative Never had cath. 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 Clearly had symptom onset 2 hours after eating at [place] and son also got diarrhea    Past Medical History:  Diagnosis Date  . Anxiety   . Asthma   . Bipolar depression ([place])   . Chronic bronchitis ([place])   . Chronic esophagogastric ulcer   . Chronic stomach ulcer   . Fibroid   . GERD (gastroesophageal reflux disease)   . Heart murmur   . Hepatic steatosis    . History of blood transfusion 2010   "related to subclavian stent"  . History of hiatal hernia   . Hyperlipidemia   . Hypertension   . Hypothyroid   . Migraine    "cerebral migraines; 1-2/month" (06/29/2018)  . 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 Stroke [facility])    "I've had 2; most recent one was ~ 2015 or before; affected balance" (06/29/2018)  . Thalassemia minor   . Type II diabetes mellitus ([place])   . Vitamin D deficiency     Past Surgical History:  Procedure Laterality Date  . ABDOMINAL HYSTERECTOMY  2015   "still have 1 ovary"  . BIOPSY  07/25/2018   Procedure: BIOPSY;  Surgeon: Thornton Park, MD;  Location: [facility] ENDOSCOPY;  Service: Gastroenterology;;  . [place]; 1996  . ESOPHAGOGASTRODUODENOSCOPY (EGD) WITH PROPOFOL N/A 07/25/2018   Procedure: ESOPHAGOGASTRODUODENOSCOPY (EGD) WITH PROPOFOL;  Surgeon: Thornton Park, MD;  Location: [place];  Service: Gastroenterology;  Laterality: N/A;  . KNEE RECONSTRUCTION Left 1980  . LAPAROSCOPIC CHOLECYSTECTOMY    . SUBCLAVIAN STENT PLACEMENT Left 2010  . TONSILLECTOMY  1970     Home Medications:  Prior to Admission medications   Medication Sig Start Date End Date Taking? Authorizing Provider  amLODipine (NORVASC) 5 MG tablet Take 1 tablet (5 mg total) by mouth daily. Patient not taking: Reported on 09/25/2018 08/11/18 08/11/19  Jonetta Osgood, MD  aspirin 81 MG tablet Take 81 mg by mouth  daily.     [provider]  atorvastatin (LIPITOR) 10 MG tablet Take 1 tablet (10 mg total) by mouth daily. 09/26/18   Shamleffer, Melanie Crazier, MD  Blood Glucose Monitoring Suppl (CONTOUR NEXT ONE) KIT 1 kit by Does not apply route as directed. 07/02/18   Patrecia Pour, MD  butalbital-acetaminophen-caffeine (FIORICET, ESGIC) [phone] MG tablet Take 1-2 tablets by mouth every 6 (six) hours as needed for headache or migraine. 07/26/18 07/26/19  Rai, Ripudeep Raliegh Ip, MD  Cholecalciferol (VITAMIN D3) 50 MCG (2000  UT) TABS Take 2,000-4,000 Units by mouth See admin instructions. Take 4,000 units by mouth once a day on Mon/Wed/Fri and 2,000 units on Sun/Tues/Thurs/Sat    [provider]  doxepin (SINEQUAN) 25 MG capsule Take 50 mg by mouth at bedtime.     [provider]  EPINEPHrine 0.3 mg/0.3 mL IJ SOAJ injection Inject 0.3 mg into the muscle once as needed (for an anaphylactic reaction).  02/08/18   [provider]  famotidine (PEPCID) 20 MG tablet Take 1 tablet (20 mg total) by mouth 2 (two) times daily. Patient not taking: Reported on 09/25/2018 07/26/18   Rai, Vernelle Emerald, MD  FLUoxetine (PROZAC) 20 MG tablet Take 60 mg by mouth every morning.     [provider]  GLUCAGON EMERGENCY 1 MG injection Inject 1 mg into the vein once as needed (FOR ONSET OF HYPOGLYCEMIA).  02/09/18   [provider]  Insulin Human (INSULIN PUMP) SOLN Inject into the skin continuous. Humalog    [provider]  levothyroxine (SYNTHROID, LEVOTHROID) 150 MCG tablet Take 150 mcg by mouth daily.     [provider]  losartan (COZAAR) 100 MG tablet Take 100 mg by mouth daily.    [provider]  Lurasidone HCl (LATUDA) 60 MG TABS Take 60 mg by mouth daily.    [provider]  Multiple Vitamin (MULTIVITAMIN WITH MINERALS) TABS tablet Take 1 tablet by mouth daily.    [provider]  nitroGLYCERIN (NITROSTAT) 0.4 MG SL tablet Place 1 tablet (0.4 mg total) under the tongue every 5 (five) minutes x 3 doses as needed for chest pain. 07/26/18   Rai, Vernelle Emerald, MD  pantoprazole (PROTONIX) 40 MG tablet Take 1 tablet (40 mg total) by mouth 2 (two) times daily before a meal. X 8 weeks 07/26/18   Rai, Ripudeep K, MD  QUEtiapine (SEROQUEL) 50 MG tablet Take 150 mg by mouth at bedtime.    [provider]  Semaglutide, 1 MG/DOSE, (OZEMPIC, 1 MG/DOSE,) 2 MG/1.5ML SOPN Inject 1 mg into the skin once a week. 09/25/18   Shamleffer, Melanie Crazier, MD     Inpatient Medications: Scheduled Meds: . [START ON 10/27/2018] aspirin EC  81 mg Oral Daily  . atorvastatin  10 mg Oral Daily  . enoxaparin (LOVENOX) injection  40 mg Subcutaneous Daily  . levothyroxine  150 mcg Oral Daily  . lurasidone  60 mg Oral Daily  . metoprolol tartrate  25 mg Oral BID  . QUEtiapine  150 mg Oral QHS   Continuous Infusions: . 0.9 % NaCl with KCl 40 mEq / L 100 mL/hr (10/26/18 0709)   PRN Meds: acetaminophen **OR** acetaminophen, hydrALAZINE, ondansetron (ZOFRAN) IV  Allergies:    Allergies  Allergen Reactions  . Ilosone [Erythromycin] Anaphylaxis  . Keflex [Cephalexin] Anaphylaxis  . Penicillins Anaphylaxis    Has patient had a PCN reaction causing immediate rash, facial/tongue/throat swelling, SOB or lightheadedness with hypotension: YES Has patient had a  PCN reaction causing severe rash involving mucus membranes or skin necrosis: NO Has patient had a PCN reaction that required hospitalizationNO Has patient had a PCN reaction occurring within the last 10 years: NO If all of the above answers are "NO", then may proceed with Cephalosporin use.  . Iron Other (See Comments)    Pt reports condition limiting Iron intake.  . Metformin And Related Other (See Comments)    Pt prefers to not take this medication  . Morphine And Related Other (See Comments)    Pt reports hallucinations    Social History:   Social History   Socioeconomic History  . Marital status: Divorced    Spouse name: Not on file  . Number of children: Not on file  . Years of education: Not on file  . Highest education level: Not on file  Occupational History  . Occupation: Electrical engineer support  Social Needs  . Financial resource strain: Not on file  . Food insecurity:    Worry: Not on file    Inability: Not on file  . Transportation needs:    Medical: Not on file    Non-medical: Not on file  Tobacco Use  . Smoking status: Former Smoker    Packs/day: 2.00    Years: 36.00     Pack years: 72.00    Types: Cigarettes    Last attempt to quit: 05/14/2014    Years since quitting: 4.4  . Smokeless tobacco: Never Used  Substance and Sexual Activity  . Alcohol use: Not Currently  . Drug use: Not Currently  . Sexual activity: Not Currently  Lifestyle  . Physical activity:    Days per week: Not on file    Minutes per session: Not on file  . Stress: Not on file  Relationships  . Social connections:    Talks on phone: Not on file    Gets together: Not on file    Attends religious service: Not on file    Active member of club or organization: Not on file    Attends meetings of clubs or organizations: Not on file    Relationship status: Not on file  . Intimate partner violence:    Fear of current or ex partner: Not on file    Emotionally abused: Not on file    Physically abused: Not on file    Forced sexual activity: Not on file  Other Topics Concern  . Not on file  Social History Narrative  . Not on file    Family History:    Family History  Problem Relation Age of Onset  . Diabetes Mother   . Hypertension Mother   . Hyperlipidemia Mother   . CAD Mother   . CAD Father   . Heart failure Father   . Hypertension Sister   . Hyperlipidemia Sister   . Hypertension Brother   . Hyperlipidemia Brother      ROS:  Please see the history of present illness.   All other ROS reviewed and negative.     Physical Exam/Data:   Vitals:   10/26/18 0300 10/26/18 0430 10/26/18 0611 10/26/18 0630  BP: (!) 174/93   134/75  Pulse: (!) 111 (!) 113  92  Resp: _0 Temp:   99.1 F (37.3 C) 99.1 F (37.3 C)  TempSrc:    Oral  SpO2: 90% 97%  98%  Weight:   103.8 kg   Height:   5' 2.5" (1.588 m)  Intake/Output Summary (Last 24 hours) at 10/26/2018 0831 Last data filed at 10/26/2018 0439 Gross per 24 hour  Intake 1100 ml  Output -  Net 1100 ml   Last 3 Weights 10/26/2018 09/25/2018 08/10/2018  Weight (lbs) 228 lb 12.8 oz 260 lb 6.4 oz 250 lb  Weight  (kg) 103.783 kg 118.117 kg 113.399 kg     Body mass index is 41.18 kg/m.  Affect appropriate Obese female  HEENT: normal Neck supple with no adenopathy JVP normal no bruits no thyromegaly Lungs clear with no wheezing and good diaphragmatic motion Heart:  S1/S2 no murmur, no rub, gallop or click PMI normal Abdomen: benighn, BS positve, no tenderness, no AAA no bruit.  No HSM or HJR Distal pulses intact with no bruits No edema Neuro non-focal Skin warm and dry No muscular weakness Scar left brachial from previous surgery   EKG:  The EKG was personally reviewed and demonstrates:  Sinus, tachycardia normal  Telemetry:  Telemetry was personally reviewed and demonstrates:  NSR rate 90's   Relevant CV Studies:  Myoview 05/14/16: IMPRESSION: 1. No reversible ischemia or infarction. 2. Normal left ventricular wall motion. 3. Left ventricular ejection fraction 75% 4. Non invasive risk stratification*: Low  Laboratory Data:  Chemistry Recent Labs  Lab 10/26/18 0200 10/26/18 0622  NA 138 138  K 2.7* 2.9*  CL 98 100  CO2 22 24  GLUCOSE 220* 180*  BUN <5* <5*  CREATININE 0.71 0.65  CALCIUM 8.7* 8.0*  GFRNONAA >60 >60  GFRAA >60 >60  ANIONGAP 18* 14    Recent Labs  Lab 10/26/18 0200  PROT 7.1  ALBUMIN 3.7  AST 48*  ALT 31  ALKPHOS 69  BILITOT 1.1   Hematology Recent Labs  Lab 10/26/18 0026  WBC 14.7*  RBC 7.22*  HGB 14.2  HCT 46.6*  MCV 64.5*  MCH 19.7*  MCHC 30.5  RDW 17.2*  PLT 317   Cardiac Enzymes Recent Labs  Lab 10/26/18 0200 10/26/18 0622  TROPONINI 0.67* 1.00*   No results for input(s): TROPIPOC in the last 168 hours.  BNPNo results for input(s): BNP, PROBNP in the last 168 hours.  DDimer No results for input(s): DDIMER in the last 168 hours.  Radiology/Studies:  No results found.  Assessment and Plan:   1. Elevated troponin 2. Abnormal EKG - troponin 0.67 --> 1.00 - EKG normal  - given her risk factors will obtain  echocardiogram today and schedule lexiscan myoview tomorrow morning - NPO tonight at [place] - pt may eat today per primary - no need for heparin her symptoms are clearly related to food she ate at [place] and she is comfortable with no chest pain    3. Hypertension 4. Sinus tachycarda - PRN hydralazine - will start lopressor 25 mg BID especially in light of troponin elevatoin  - titrate as needed   5. Leukocytosis Per primary   6. Hypokalemia - K 2.9, replace by primary when taking orals  Burning a lot going in right arm nurse to flush and slow rate down      For questions or updates, please contact [place] Please consult [email or web address] for contact info under     Signed, Jenkins Rouge, MD  10/26/2018 8:31 AM

## 2018-10-26 NOTE — Progress Notes (Signed)
CRITICAL VALUE ALERT  Critical Value:   Troponin = 1.00  Date & Time Notied:   10/26/2018 0729  Provider Notified:  Dr. Sloan Leiter  Orders Received/Actions taken: MD aware

## 2018-10-26 NOTE — ED Notes (Signed)
ED TO INPATIENT HANDOFF REPORT  ED Nurse Name and Phone #: jess f   S Name/Age/Gender Morgan [name] 53 y.o. female Room/Bed: 029C/029C  Code Status   Code Status: Full Code  Home/SNF/Other Home Patient oriented to: self, place, time and situation Is this baseline? Yes   Triage Complete: Triage complete  Chief Complaint vomiting shaking  Triage Note Pt states that n/v/d started this afternoon after eating a chicken filet, pt is diabetic. Denies fevers   Allergies Allergies  Allergen Reactions  . Ilosone [Erythromycin] Anaphylaxis  . Keflex [Cephalexin] Anaphylaxis  . Penicillins Anaphylaxis    Has patient had a PCN reaction causing immediate rash, facial/tongue/throat swelling, SOB or lightheadedness with hypotension: YES Has patient had a PCN reaction causing severe rash involving mucus membranes or skin necrosis: NO Has patient had a PCN reaction that required hospitalizationNO Has patient had a PCN reaction occurring within the last 10 years: NO If all of the above answers are "NO", then may proceed with Cephalosporin use.  . Iron Other (See Comments)    Pt reports condition limiting Iron intake.  . Metformin And Related Other (See Comments)    Pt prefers to not take this medication  . Morphine And Related Other (See Comments)    Pt reports hallucinations    Level of Care/Admitting Diagnosis ED Disposition    ED Disposition Condition [facility] Area: [place] [100100]  Level of Care: Progressive [102]  I expect the patient will be discharged within 24 hours: No (not a candidate for 5C-Observation unit)  Diagnosis: Intractable nausea and vomiting [213086]  Admitting Physician: Mariel Aloe [phone]  Attending Physician: Cordelia Poche A [phone]  PT Class (Do Not Modify): Observation [104]  PT Acc Code (Do Not Modify): Observation [10022]       B Medical/Surgery History Past Medical History:  Diagnosis Date  . Anxiety    . Asthma   . Bipolar depression ([place])   . Chronic bronchitis ([place])   . Chronic esophagogastric ulcer   . Chronic stomach ulcer   . Fibroid   . GERD (gastroesophageal reflux disease)   . Heart murmur   . Hepatic steatosis   . History of blood transfusion 2010   "related to subclavian stent"  . History of hiatal hernia   . Hyperlipidemia   . Hypertension   . Hypothyroid   . Migraine    "cerebral migraines; 1-2/month" (06/29/2018)  . 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 Stroke [facility])    "I've had 2; most recent one was ~ 2015 or before; affected balance" (06/29/2018)  . Thalassemia minor   . Type II diabetes mellitus ([place])   . Vitamin D deficiency    Past Surgical History:  Procedure Laterality Date  . ABDOMINAL HYSTERECTOMY  2015   "still have 1 ovary"  . BIOPSY  07/25/2018   Procedure: BIOPSY;  Surgeon: Thornton Park, MD;  Location: [facility] ENDOSCOPY;  Service: Gastroenterology;;  . [place]; 1996  . ESOPHAGOGASTRODUODENOSCOPY (EGD) WITH PROPOFOL N/A 07/25/2018   Procedure: ESOPHAGOGASTRODUODENOSCOPY (EGD) WITH PROPOFOL;  Surgeon: Thornton Park, MD;  Location: [place];  Service: Gastroenterology;  Laterality: N/A;  . KNEE RECONSTRUCTION Left 1980  . LAPAROSCOPIC CHOLECYSTECTOMY    . SUBCLAVIAN STENT PLACEMENT Left 2010  . TONSILLECTOMY  1970     A IV Location/Drains/Wounds Patient Lines/Drains/Airways Status   Active Line/Drains/Airways    Name:   Placement date:   Placement time:  Site:   Days:   Peripheral IV 10/26/18 Right Forearm   10/26/18    0024    Forearm   less than 1          Intake/Output Last 24 hours  Intake/Output Summary (Last 24 hours) at 10/26/2018 0520 Last data filed at 10/26/2018 0439 Gross per 24 hour  Intake 1100 ml  Output -  Net 1100 ml    Labs/Imaging Results for orders placed or performed during the hospital encounter of 10/26/18 (from the past 48 hour(s))  CBG monitoring, ED     Status: Abnormal    Collection Time: 10/26/18 12:10 AM  Result Value Ref Range   Glucose-Capillary 249 (H) 70 - 99 mg/dL  CBC with Differential     Status: Abnormal   Collection Time: 10/26/18 12:26 AM  Result Value Ref Range   WBC 14.7 (H) 4.0 - 10.5 K/uL   RBC 7.22 (H) 3.87 - 5.11 MIL/uL   Hemoglobin 14.2 12.0 - 15.0 g/dL   HCT 46.6 (H) 36.0 - 46.0 %   MCV 64.5 (L) 80.0 - 100.0 fL   MCH 19.7 (L) 26.0 - 34.0 pg   MCHC 30.5 30.0 - 36.0 g/dL   RDW 17.2 (H) 11.5 - 15.5 %   Platelets 317 150 - 400 K/uL   nRBC 0.0 0.0 - 0.2 %   Neutrophils Relative % 82 %   Neutro Abs 13.5 (H) 1.7 - 7.7 K/uL   Band Neutrophils 10 %   Lymphocytes Relative 6 %   Lymphs Abs 0.9 0.7 - 4.0 K/uL   Monocytes Relative 1 %   Monocytes Absolute 0.1 0.1 - 1.0 K/uL   Eosinophils Relative 0 %   Eosinophils Absolute 0.0 0.0 - 0.5 K/uL   Basophils Relative 1 %   Basophils Absolute 0.1 0.0 - 0.1 K/uL   nRBC 0 0 /100 WBC   Abs Immature Granulocytes 0.00 0.00 - 0.07 K/uL   Schistocytes PRESENT    Tear Drop Cells PRESENT    Polychromasia PRESENT     Comment: Performed at [facility], [address], [place], [place] [address]  Urinalysis, Routine w reflex microscopic     Status: Abnormal   Collection Time: 10/26/18  1:43 AM  Result Value Ref Range   Color, Urine STRAW (A) YELLOW   APPearance CLEAR CLEAR   Specific Gravity, Urine 1.008 1.005 - 1.030   pH 7.0 5.0 - 8.0   Glucose, UA >=500 (A) NEGATIVE mg/dL   Hgb urine dipstick NEGATIVE NEGATIVE   Bilirubin Urine NEGATIVE NEGATIVE   Ketones, ur 20 (A) NEGATIVE mg/dL   Protein, ur 30 (A) NEGATIVE mg/dL   Nitrite NEGATIVE NEGATIVE   Leukocytes,Ua NEGATIVE NEGATIVE   RBC / HPF 0-5 0 - 5 RBC/hpf   WBC, UA 0-5 0 - 5 WBC/hpf   Bacteria, UA NONE SEEN NONE SEEN   Squamous Epithelial / LPF 0-5 0 - 5   Mucus PRESENT    Hyaline Casts, UA PRESENT     Comment: Performed at [facility], [place] [address], [place], [place] [address]  Comprehensive metabolic panel     Status:  Abnormal   Collection Time: 10/26/18  2:00 AM  Result Value Ref Range   Sodium 138 135 - 145 mmol/L   Potassium 2.7 (LL) 3.5 - 5.1 mmol/L    Comment: CRITICAL RESULT CALLED TO, READ BACK BY AND VERIFIED WITH: Damar Petit J,RN 10/26/18 0305 WAYK    Chloride 98 98 - 111 mmol/L  CO2 22 22 - 32 mmol/L   Glucose, Bld 220 (H) 70 - 99 mg/dL   BUN <5 (L) 6 - 20 mg/dL   Creatinine, Ser 0.71 0.44 - 1.00 mg/dL   Calcium 8.7 (L) 8.9 - 10.3 mg/dL   Total Protein 7.1 6.5 - 8.1 g/dL   Albumin 3.7 3.5 - 5.0 g/dL   AST 48 (H) 15 - 41 U/L   ALT 31 0 - 44 U/L   Alkaline Phosphatase 69 38 - 126 U/L   Total Bilirubin 1.1 0.3 - 1.2 mg/dL   GFR calc non Af Amer >60 >60 mL/min   GFR calc Af Amer >60 >60 mL/min   Anion gap 18 (H) 5 - 15    Comment: Performed at [facility], [address], [place], [place] [address]  Magnesium     Status: None   Collection Time: 10/26/18  2:00 AM  Result Value Ref Range   Magnesium 2.2 1.7 - 2.4 mg/dL    Comment: Performed at [facility], [place] [address] [place], [place] [address]  Troponin I -     Status: Abnormal   Collection Time: 10/26/18  2:00 AM  Result Value Ref Range   Troponin I 0.67 (HH) <0.03 ng/mL    Comment: CRITICAL RESULT CALLED TO, READ BACK BY AND VERIFIED WITH: Billie Lade 10/26/18 0305 WAYK Performed at [place] [address], [place], [place] [address]    No results found.  Pending Labs Unresulted Labs (From admission, onward)    Start     Ordered   11/02/18 0500  Creatinine, serum  (enoxaparin (LOVENOX)    CrCl >/= 30 ml/min)  Weekly,   R    Comments:  while on enoxaparin therapy    10/26/18 0501   10/27/18 0500  CBC  Tomorrow morning,   R     10/26/18 0501   10/26/18 1308  Basic metabolic panel  Now then every 4 hours,   R     10/26/18 0501   10/26/18 6578  Basic metabolic panel  Once,   R     10/26/18 0437   10/26/18 0500  Troponin I - Once-Timed  Once-Timed,   STAT     10/26/18 0341   10/26/18 0342   Troponin I - Once-Timed  Once-Timed,   STAT     10/26/18 0341          Vitals/Pain Today's Vitals   10/26/18 0145 10/26/18 0230 10/26/18 0300 10/26/18 0414  BP: (!) 181/102 (!) 164/67 (!) 174/93   Pulse: (!) 109 (!) 115 (!) 111   Resp:  16 12   Temp:      TempSrc:      SpO2: 96% 91% 90%   PainSc:    0-No pain    Isolation Precautions No active isolations  Medications Medications  potassium chloride 10 mEq in 100 mL IVPB (10 mEq Intravenous New Bag/Given 10/26/18 0440)  ondansetron (ZOFRAN) injection 4 mg (has no administration in time range)  0.9 % NaCl with KCl 40 mEq / L  infusion (has no administration in time range)  aspirin EC tablet 81 mg (has no administration in time range)  atorvastatin (LIPITOR) tablet 10 mg (has no administration in time range)  Lurasidone HCl TABS 60 mg (has no administration in time range)  QUEtiapine (SEROQUEL) tablet 150 mg (has no administration in time range)  levothyroxine (SYNTHROID, LEVOTHROID) tablet 150 mcg (has no administration in time range)  enoxaparin (  LOVENOX) injection 40 mg (has no administration in time range)  acetaminophen (TYLENOL) tablet 650 mg (has no administration in time range)    Or  acetaminophen (TYLENOL) suppository 650 mg (has no administration in time range)  hydrALAZINE (APRESOLINE) injection 5 mg (has no administration in time range)  sodium chloride 0.9 % bolus 1,000 mL (0 mLs Intravenous Stopped 10/26/18 0414)  ondansetron (ZOFRAN) injection 4 mg (4 mg Intravenous Given 10/26/18 0025)  magnesium sulfate IVPB 2 g 50 mL (0 g Intravenous Stopped 10/26/18 0225)  metoCLOPramide (REGLAN) injection 10 mg (10 mg Intravenous Given 10/26/18 0224)  aspirin chewable tablet 324 mg (324 mg Oral Given 10/26/18 0349)  sodium chloride 0.9 % bolus 1,000 mL (1,000 mLs Intravenous New Bag/Given 10/26/18 0347)    Mobility walks Low fall risk   Focused Assessments Cardiac Assessment Handoff:    Lab Results  Component Value  Date   CKTOTAL 30 (L) 06/27/2018   TROPONINI 0.67 ([place]) 10/26/2018   Lab Results  Component Value Date   DDIMER 1.10 (H) 07/21/2018   Does the Patient currently have chest pain? No     R Recommendations: See Admitting Provider Note  Report given to:   Additional Notes: n/a

## 2018-10-26 NOTE — Progress Notes (Signed)
Patient was seen and examined at the bedside.  Admitted early morning by nighttime hospitalist.  She is a 53 year old female with history of insulin-dependent diabetes, hypothyroidism, bipolar disorder and depression, GERD.  Patient presented to the emergency room with 1 day history of intractable nausea and epigastric discomfort after eating outside food.  She took some Pepto-Bismol with some improvement.  She had similar episode in January that did not improve for 3 days so she did not wanted to wait until then so came to the ER.  In the emergency room patient was tachycardic, potassium was 2.7, glucose 220 with anion gap of 18 normal bicarbonate.  Point-of-care troponin were elevated.  Patient received IV fluids and potassium supplements.  He still has some nausea but overall feels better.  Assessment plan:  Intractable nausea vomiting: Suspect gastroenteritis, aggravated GERD symptoms.  Symptomatic treatment.  Advance to clear liquid diet.  Adequate nausea medications.  Adequate IV fluids.  Her symptoms are already improving.  Elevated troponins level.  Suspect type II non-STEMI.  Patient without any chest pain.  Subsequent EKGs with no ischemic changes.  She has received aspirin, Statin and beta-blockers.  Currently denies any chest pain.  She did have epigastric pain.  Seen by cardiology.  We will continue to monitor troponin levels.  2D echo was done, results pending.  Scheduled for Lexiscan for the morning.  Insulin-dependent diabetes: Patient was treated with IV fluids overnight.  She is not acidotic.  Blood sugars are less than 200.  She is able to do some clear liquid diet.  Initially plan for insulin infusion, however patient does not require IV insulin at this time.  She uses about 60 units of insulin in a day.  She has poor oral intake.  Will put patient on 15 units of long-acting insulin twice a day and keep on sliding scale insulin.  Hypokalemia: Replace aggressively.  Will monitor levels.   Magnesium was replaced.  Hypertension: Fairly stable.  Hypothyroidism: Clinically euthyroid on current regimen.  Continue.
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
## 2018-10-26 NOTE — Progress Notes (Signed)
   10/26/18 0630  Vitals  Temp 99.1 F (37.3 C)  Temp Source Oral  BP 134/75  MAP (mmHg) 89  BP Location Right Arm  BP Method Automatic  Patient Position (if appropriate) Lying  Pulse Rate 92  Pulse Rate Source Monitor  ECG Heart Rate 93  Resp 15  Oxygen Therapy  SpO2 98 %  O2 Device Room Air  Pain Assessment  Pain Scale 0-10  Pain Score 0  MEWS Score  MEWS RR 0  MEWS Pulse 0  MEWS Systolic 0  MEWS LOC 0  MEWS Temp 0  MEWS Score 0  MEWS Score Color Green   Mr Morgan [name] was received to 5W 38. Pt was Oriented to unit and how to call for assistance. Pt  Verbalizes understanding risks associated with falls. Skin assessed with redness noted on abd folds otherwise skin intact.

## 2018-10-26 NOTE — Progress Notes (Signed)
  Echocardiogram 2D Echocardiogram has been performed.  Randa Lynn Loeta Herst 10/26/2018, 9:49 AM

## 2018-10-26 NOTE — ED Notes (Signed)
Delay lab draw,  Pt currently in bathroom.

## 2018-10-26 NOTE — Progress Notes (Addendum)
Inpatient Diabetes Program Recommendations  AACE/ADA: New Consensus Statement on Inpatient Glycemic Control (2015)  Target Ranges:  Prepandial:   less than 140 mg/dL      Peak postprandial:   less than 180 mg/dL (1-2 hours)      Critically ill patients:  140 - 180 mg/dL   Lab Results  Component Value Date   GLUCAP 249 (H) 10/26/2018   HGBA1C 10.4 (H) 07/26/2018    Review of Glycemic Control  Diabetes history: type 2 Outpatient Diabetes medications: [employer] insulin pump (Humalog), Ozempic 1 mg every week Current orders for Inpatient glycemic control: Levemir 15 units BID, Novolog SENSITIVE correction scale every 4 hours.  Inpatient Diabetes Program Recommendations:     Per chart review note on 08/10/2018: Patient is followed by Dr Denton Lank, endocrinology with last visit from 06/23/18. Last insulin pump rates were:Current Pump settings: 0000-0700: 3.0 u/hr 0700-0900: 3:0 u/hr 0900-0000: 10 u/hr  ICR- 12 ISF- 20  *Total basal seems to be about 57 units/24 hours.  Agree with current orders for Levemir 15 units BID and Novolog SENSITIVE correction scale every 4 hours.Titrate dosages as needed.Will continue to monitor blood sugars while in the hospital.   Harvel Ricks RN BSN CDE Diabetes Coordinator Pager: [phone]  8am-5pm

## 2018-10-26 NOTE — Consult Note (Addendum)
ANTICOAGULATION CONSULT NOTE - Initial Consult  Pharmacy Consult for heparin dosing Indication: chest pain/ACS  Allergies  Allergen Reactions  . Ilosone [Erythromycin] Anaphylaxis  . Keflex [Cephalexin] Anaphylaxis  . Penicillins Anaphylaxis    Has patient had a PCN reaction causing immediate rash, facial/tongue/throat swelling, SOB or lightheadedness with hypotension: YES Has patient had a PCN reaction causing severe rash involving mucus membranes or skin necrosis: NO Has patient had a PCN reaction that required hospitalizationNO Has patient had a PCN reaction occurring within the last 10 years: NO If all of the above answers are "NO", then may proceed with Cephalosporin use.  . Iron Other (See Comments)    Pt reports condition limiting Iron intake.  . Metformin And Related Other (See Comments)    Pt prefers to not take this medication  . Morphine And Related Other (See Comments)    Pt reports hallucinations    Patient Measurements: Height: 5' 2.5" (158.8 cm) Weight: 228 lb 12.8 oz (103.8 kg) IBW/kg (Calculated) : 51.25 Heparin Dosing Weight: 76 kg  Vital Signs: Temp: 99.4 F (37.4 C) (03/26 1208) Temp Source: Oral (03/26 1208) BP: 178/96 (03/26 1208) Pulse Rate: 97 (03/26 1208)  Labs: Recent Labs    10/26/18 0026  10/26/18 0200 10/26/18 0622 10/26/18 0938 10/26/18 1127  HGB 14.2  --   --   --   --   --   HCT 46.6*  --   --   --   --   --   PLT 317  --   --   --   --   --   CREATININE  --    < > 0.71 0.65 0.66 0.74  TROPONINI  --   --  0.67* 1.00*  --  0.98*   < > = values in this interval not displayed.    Estimated Creatinine Clearance: 91.8 mL/min (by C-G formula based on SCr of 0.74 mg/dL).   Medical History: Past Medical History:  Diagnosis Date  . Anxiety   . Asthma   . Bipolar depression ([place])   . Chronic bronchitis ([place])   . Chronic esophagogastric ulcer   . Chronic stomach ulcer   . Fibroid   . GERD (gastroesophageal reflux disease)   .  Heart murmur   . Hepatic steatosis   . History of blood transfusion 2010   "related to subclavian stent"  . History of hiatal hernia   . Hyperlipidemia   . Hypertension   . Hypothyroid   . Migraine    "cerebral migraines; 1-2/month" (06/29/2018)  . 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 Stroke [facility])    "I've had 2; most recent one was ~ 2015 or before; affected balance" (06/29/2018)  . Thalassemia minor   . Type II diabetes mellitus ([place])   . Vitamin D deficiency    Assessment: 53 yo female admitted for chest pain/ NSTEMI. Heparin required in preparation of cardiac catherization on 3/27.   Goal of Therapy:  Heparin level 0.3-0.7 units/ml Monitor platelets by anticoagulation protocol: Yes   Plan:  Give 4000 unit heparin bolus Start heparin 900 units/hr infusion Monitor HL in 8 hours Monitor daily CBC and s/sx of bleeding  Thanks,   Lavena Bullion, PharmD Candidate 10/26/2018,1:50 PM

## 2018-10-26 NOTE — ED Notes (Signed)
Nurse starting IV and will draw labs. 

## 2018-10-27 ENCOUNTER — Other Ambulatory Visit: Payer: Self-pay

## 2018-10-27 ENCOUNTER — Observation Stay (HOSPITAL_COMMUNITY): Payer: 59

## 2018-10-27 ENCOUNTER — Ambulatory Visit (HOSPITAL_COMMUNITY): Admission: RE | Admit: 2018-10-27 | Payer: 59 | Source: Home / Self Care | Admitting: Cardiovascular Disease

## 2018-10-27 ENCOUNTER — Encounter (HOSPITAL_COMMUNITY)
Admission: EM | Disposition: A | Discharge status: Home / Self Care | Payer: Self-pay | Source: Home / Self Care | Attending: Internal Medicine

## 2018-10-27 ENCOUNTER — Inpatient Hospital Stay (HOSPITAL_COMMUNITY): Payer: 59

## 2018-10-27 DIAGNOSIS — I214 Non-ST elevation (NSTEMI) myocardial infarction: Secondary | ICD-10-CM

## 2018-10-27 DIAGNOSIS — J42 Unspecified chronic bronchitis: Secondary | ICD-10-CM | POA: Diagnosis present

## 2018-10-27 DIAGNOSIS — I5021 Acute systolic (congestive) heart failure: Secondary | ICD-10-CM | POA: Diagnosis not present

## 2018-10-27 DIAGNOSIS — R0902 Hypoxemia: Secondary | ICD-10-CM | POA: Diagnosis not present

## 2018-10-27 DIAGNOSIS — J9601 Acute respiratory failure with hypoxia: Secondary | ICD-10-CM | POA: Diagnosis not present

## 2018-10-27 DIAGNOSIS — K760 Fatty (change of) liver, not elsewhere classified: Secondary | ICD-10-CM | POA: Diagnosis present

## 2018-10-27 DIAGNOSIS — E02 Subclinical iodine-deficiency hypothyroidism: Secondary | ICD-10-CM

## 2018-10-27 DIAGNOSIS — R112 Nausea with vomiting, unspecified: Secondary | ICD-10-CM | POA: Diagnosis present

## 2018-10-27 DIAGNOSIS — I5023 Acute on chronic systolic (congestive) heart failure: Secondary | ICD-10-CM | POA: Diagnosis not present

## 2018-10-27 DIAGNOSIS — Z87891 Personal history of nicotine dependence: Secondary | ICD-10-CM | POA: Diagnosis not present

## 2018-10-27 DIAGNOSIS — I110 Hypertensive heart disease with heart failure: Secondary | ICD-10-CM | POA: Diagnosis present

## 2018-10-27 DIAGNOSIS — E1110 Type 2 diabetes mellitus with ketoacidosis without coma: Secondary | ICD-10-CM | POA: Diagnosis present

## 2018-10-27 DIAGNOSIS — J189 Pneumonia, unspecified organism: Secondary | ICD-10-CM | POA: Diagnosis not present

## 2018-10-27 DIAGNOSIS — J45909 Unspecified asthma, uncomplicated: Secondary | ICD-10-CM | POA: Diagnosis present

## 2018-10-27 DIAGNOSIS — R7989 Other specified abnormal findings of blood chemistry: Secondary | ICD-10-CM | POA: Diagnosis not present

## 2018-10-27 DIAGNOSIS — E785 Hyperlipidemia, unspecified: Secondary | ICD-10-CM | POA: Diagnosis present

## 2018-10-27 DIAGNOSIS — I10 Essential (primary) hypertension: Secondary | ICD-10-CM | POA: Diagnosis not present

## 2018-10-27 DIAGNOSIS — E039 Hypothyroidism, unspecified: Secondary | ICD-10-CM

## 2018-10-27 DIAGNOSIS — Z8673 Personal history of transient ischemic attack (TIA), and cerebral infarction without residual deficits: Secondary | ICD-10-CM | POA: Diagnosis not present

## 2018-10-27 DIAGNOSIS — G43909 Migraine, unspecified, not intractable, without status migrainosus: Secondary | ICD-10-CM | POA: Diagnosis present

## 2018-10-27 DIAGNOSIS — E1169 Type 2 diabetes mellitus with other specified complication: Secondary | ICD-10-CM

## 2018-10-27 DIAGNOSIS — A059 Bacterial foodborne intoxication, unspecified: Secondary | ICD-10-CM | POA: Diagnosis present

## 2018-10-27 DIAGNOSIS — K210 Gastro-esophageal reflux disease with esophagitis: Secondary | ICD-10-CM | POA: Diagnosis present

## 2018-10-27 DIAGNOSIS — Z9641 Presence of insulin pump (external) (internal): Secondary | ICD-10-CM | POA: Diagnosis present

## 2018-10-27 DIAGNOSIS — Z6841 Body Mass Index (BMI) 40.0 and over, adult: Secondary | ICD-10-CM | POA: Diagnosis not present

## 2018-10-27 DIAGNOSIS — E669 Obesity, unspecified: Secondary | ICD-10-CM

## 2018-10-27 DIAGNOSIS — Z955 Presence of coronary angioplasty implant and graft: Secondary | ICD-10-CM | POA: Diagnosis not present

## 2018-10-27 DIAGNOSIS — I4581 Long QT syndrome: Secondary | ICD-10-CM | POA: Diagnosis present

## 2018-10-27 DIAGNOSIS — F319 Bipolar disorder, unspecified: Secondary | ICD-10-CM | POA: Diagnosis present

## 2018-10-27 DIAGNOSIS — I2510 Atherosclerotic heart disease of native coronary artery without angina pectoris: Secondary | ICD-10-CM

## 2018-10-27 DIAGNOSIS — D563 Thalassemia minor: Secondary | ICD-10-CM | POA: Diagnosis present

## 2018-10-27 DIAGNOSIS — E876 Hypokalemia: Secondary | ICD-10-CM | POA: Diagnosis present

## 2018-10-27 HISTORY — PX: CORONARY STENT INTERVENTION: CATH118234

## 2018-10-27 HISTORY — PX: LEFT HEART CATH AND CORONARY ANGIOGRAPHY: CATH118249

## 2018-10-27 HISTORY — DX: Non-ST elevation (NSTEMI) myocardial infarction: I21.4

## 2018-10-27 LAB — URINALYSIS, ROUTINE W REFLEX MICROSCOPIC
Bilirubin Urine: NEGATIVE
Glucose, UA: NEGATIVE mg/dL
Hgb urine dipstick: NEGATIVE
Ketones, ur: 5 mg/dL — AB
Leukocytes,Ua: NEGATIVE
Nitrite: NEGATIVE
Protein, ur: NEGATIVE mg/dL
Specific Gravity, Urine: 1.016 (ref 1.005–1.030)
pH: 7 (ref 5.0–8.0)

## 2018-10-27 LAB — BASIC METABOLIC PANEL
Anion gap: 7 (ref 5–15)
BUN: 6 mg/dL (ref 6–20)
CO2: 21 mmol/L — AB (ref 22–32)
Calcium: 7.9 mg/dL — ABNORMAL LOW (ref 8.9–10.3)
Chloride: 110 mmol/L (ref 98–111)
Creatinine, Ser: 0.68 mg/dL (ref 0.44–1.00)
GFR calc Af Amer: >60 mL/min (ref >60)
GFR calc non Af Amer: >60 mL/min (ref >60)
GLUCOSE: 149 mg/dL — AB (ref 70–99)
Potassium: 4 mmol/L (ref 3.5–5.1)
Sodium: 138 mmol/L (ref 135–145)

## 2018-10-27 LAB — GLUCOSE, CAPILLARY
Glucose-Capillary: 157 mg/dL — ABNORMAL HIGH (ref 70–99)
Glucose-Capillary: 163 mg/dL — ABNORMAL HIGH (ref 70–99)
Glucose-Capillary: 137 mg/dL — ABNORMAL HIGH (ref 70–99)
Glucose-Capillary: 124 mg/dL — ABNORMAL HIGH (ref 70–99)
Glucose-Capillary: 129 mg/dL — ABNORMAL HIGH (ref 70–99)
Glucose-Capillary: 261 mg/dL — ABNORMAL HIGH (ref 70–99)

## 2018-10-27 LAB — CBC
HCT: 43.5 % (ref 36.0–46.0)
Hemoglobin: 13.9 g/dL (ref 12.0–15.0)
MCH: 20.1 pg — ABNORMAL LOW (ref 26.0–34.0)
MCHC: 32 g/dL (ref 30.0–36.0)
MCV: 63 fL — ABNORMAL LOW (ref 80.0–100.0)
NRBC: 0 % (ref 0.0–0.2)
Platelets: 253 10*3/uL (ref 150–400)
RBC: 6.9 MIL/uL — ABNORMAL HIGH (ref 3.87–5.11)
RDW: 16.6 % — ABNORMAL HIGH (ref 11.5–15.5)
WBC: 23.7 10*3/uL — ABNORMAL HIGH (ref 4.0–10.5)

## 2018-10-27 LAB — POCT ACTIVATED CLOTTING TIME: Activated Clotting Time: 296 seconds

## 2018-10-27 SURGERY — LEFT HEART CATH AND CORONARY ANGIOGRAPHY
Anesthesia: LOCAL

## 2018-10-27 MED ORDER — SODIUM CHLORIDE 0.9% FLUSH: 3.0000 mL | INTRAVENOUS | Status: DC | PRN

## 2018-10-27 MED ORDER — LAMOTRIGINE 100 MG PO TABS
50.0000 mg | ORAL_TABLET | Freq: Every day | ORAL | Status: DC
  Administered 2018-10-27 – 2018-10-31 (×5): 50 mg via ORAL
  Filled 2018-10-27 (×5): qty 1

## 2018-10-27 MED ORDER — NITROGLYCERIN 1 MG/10 ML FOR IR/CATH LAB
INTRA_ARTERIAL | Status: DC | PRN
  Administered 2018-10-27: 200 ug via INTRACORONARY
  Administered 2018-10-27: 100 ug via INTRACORONARY

## 2018-10-27 MED ORDER — FUROSEMIDE 10 MG/ML IJ SOLN
40.0000 mg | Freq: Once | INTRAMUSCULAR | Status: AC
  Administered 2018-10-27: 40 mg via INTRAVENOUS
  Filled 2018-10-27: qty 4

## 2018-10-27 MED ORDER — SODIUM CHLORIDE 0.9 % IV SOLN: 250.0000 mL | INTRAVENOUS | Status: DC | PRN

## 2018-10-27 MED ORDER — TICAGRELOR 90 MG PO TABS
ORAL_TABLET | ORAL | Status: DC | PRN
  Administered 2018-10-27: 180 mg via ORAL

## 2018-10-27 MED ORDER — SODIUM CHLORIDE 0.9 % WEIGHT BASED INFUSION: 1.0000 mL/kg/h | INTRAVENOUS | Status: DC

## 2018-10-27 MED ORDER — SODIUM CHLORIDE 0.9% FLUSH
3.0000 mL | Freq: Two times a day (BID) | INTRAVENOUS | Status: DC
  Administered 2018-10-28 – 2018-10-31 (×8): 3 mL via INTRAVENOUS

## 2018-10-27 MED ORDER — HEPARIN BOLUS VIA INFUSION
2000.0000 [IU] | Freq: Once | INTRAVENOUS | Status: AC
  Administered 2018-10-27: 2000 [IU] via INTRAVENOUS
  Filled 2018-10-27: qty 2000

## 2018-10-27 MED ORDER — LABETALOL HCL 5 MG/ML IV SOLN
10.0000 mg | INTRAVENOUS | Status: AC | PRN
  Administered 2018-10-27 (×3): 10 mg via INTRAVENOUS
  Filled 2018-10-27 (×2): qty 4

## 2018-10-27 MED ORDER — SODIUM CHLORIDE 0.9 % IV SOLN
100.0000 mg | Freq: Two times a day (BID) | INTRAVENOUS | Status: DC
  Administered 2018-10-27 – 2018-10-30 (×6): 100 mg via INTRAVENOUS
  Filled 2018-10-27 (×7): qty 100

## 2018-10-27 MED ORDER — SODIUM CHLORIDE 0.9 % WEIGHT BASED INFUSION: 3.0000 mL/kg/h | INTRAVENOUS | Status: DC

## 2018-10-27 MED ORDER — ASPIRIN 81 MG PO CHEW
81.0000 mg | CHEWABLE_TABLET | ORAL | Status: AC
  Administered 2018-10-27: 81 mg via ORAL
  Filled 2018-10-27: qty 1

## 2018-10-27 MED ORDER — SODIUM CHLORIDE 0.9% FLUSH: 3.0000 mL | Freq: Two times a day (BID) | INTRAVENOUS | Status: DC

## 2018-10-27 MED ORDER — MIDAZOLAM HCL 2 MG/2ML IJ SOLN
INTRAMUSCULAR | Status: DC | PRN
  Administered 2018-10-27 (×2): 1 mg via INTRAVENOUS

## 2018-10-27 MED ORDER — HEPARIN SODIUM (PORCINE) 1000 UNIT/ML IJ SOLN
INTRAMUSCULAR | Status: DC | PRN
  Administered 2018-10-27: 5000 [IU] via INTRAVENOUS

## 2018-10-27 MED ORDER — VERAPAMIL HCL 2.5 MG/ML IV SOLN
INTRAVENOUS | Status: DC | PRN
  Administered 2018-10-27: 10 mL via INTRA_ARTERIAL

## 2018-10-27 MED ORDER — HEPARIN (PORCINE) IN NACL 1000-0.9 UT/500ML-% IV SOLN
INTRAVENOUS | Status: DC | PRN
  Administered 2018-10-27 (×2): 500 mL

## 2018-10-27 MED ORDER — IOHEXOL 350 MG/ML SOLN
INTRAVENOUS | Status: DC | PRN
  Administered 2018-10-27: 105 mL via INTRAVENOUS

## 2018-10-27 MED ORDER — LIDOCAINE HCL (PF) 1 % IJ SOLN
INTRAMUSCULAR | Status: DC | PRN
  Administered 2018-10-27: 2 mL via INTRADERMAL

## 2018-10-27 MED ORDER — SODIUM CHLORIDE 0.9 % WEIGHT BASED INFUSION
3.0000 mL/kg/h | INTRAVENOUS | Status: DC
  Administered 2018-10-27: 3 mL/kg/h via INTRAVENOUS

## 2018-10-27 MED ORDER — FENTANYL CITRATE (PF) 100 MCG/2ML IJ SOLN
INTRAMUSCULAR | Status: DC | PRN
  Administered 2018-10-27 (×2): 25 ug via INTRAVENOUS

## 2018-10-27 MED ORDER — TICAGRELOR 90 MG PO TABS
90.0000 mg | ORAL_TABLET | Freq: Two times a day (BID) | ORAL | Status: DC
  Administered 2018-10-28 – 2018-10-31 (×8): 90 mg via ORAL
  Filled 2018-10-27 (×8): qty 1

## 2018-10-27 MED ORDER — SODIUM CHLORIDE 0.9 % WEIGHT BASED INFUSION
1.0000 mL/kg/h | INTRAVENOUS | Status: DC
  Administered 2018-10-27: 1 mL/kg/h via INTRAVENOUS

## 2018-10-27 SURGICAL SUPPLY — 25 items
BALLN EMERGE MR 2.0X15 (BALLOONS) ×2
BALLN ~~LOC~~ EMERGE MR 2.5X20 (BALLOONS) ×2
BALLN ~~LOC~~ EMERGE MR 3.25X20 (BALLOONS) ×2
BALLOON EMERGE MR 2.0X15 (BALLOONS) ×1 IMPLANT
BALLOON ~~LOC~~ EMERGE MR 2.5X20 (BALLOONS) ×1 IMPLANT
BALLOON ~~LOC~~ EMERGE MR 3.25X20 (BALLOONS) ×1 IMPLANT
CATH 5FR JL3.5 JR4 ANG PIG MP (CATHETERS) ×2 IMPLANT
CATH LAUNCHER 5F EBU3.0 (CATHETERS) ×1 IMPLANT
CATHETER LAUNCHER 5F EBU3.0 (CATHETERS) ×2
DEVICE RAD COMP TR BAND LRG (VASCULAR PRODUCTS) ×2 IMPLANT
DRAPE X-RAY CASS 24X20 (DRAPES) ×2 IMPLANT
GLIDESHEATH SLEND SS 6F .021 (SHEATH) ×2 IMPLANT
GUIDEWIRE INQWIRE 1.5J.035X260 (WIRE) ×1 IMPLANT
INQWIRE 1.5J .035X260CM (WIRE) ×2
KIT ENCORE 26 ADVANTAGE (KITS) ×2 IMPLANT
KIT HEART LEFT (KITS) ×2 IMPLANT
KIT HEMO VALVE WATCHDOG (MISCELLANEOUS) ×2 IMPLANT
PACK CARDIAC CATHETERIZATION (CUSTOM PROCEDURE TRAY) ×2 IMPLANT
SHEATH PROBE COVER 6X72 (BAG) ×2 IMPLANT
STENT RESOLUTE ONYX 2.25X26 (Permanent Stent) ×2 IMPLANT
STENT RESOLUTE ONYX 3.0X26 (Permanent Stent) ×2 IMPLANT
TRANSDUCER W/STOPCOCK (MISCELLANEOUS) ×2 IMPLANT
TUBING CIL FLEX 10 FLL-RA (TUBING) ×2 IMPLANT
WIRE COUGAR XT STRL 190CM (WIRE) ×2 IMPLANT
WIRE HI TORQ VERSACORE-J 145CM (WIRE) ×2 IMPLANT

## 2018-10-27 NOTE — H&P (View-Only) (Signed)
Progress Note  Patient Name: Morgan [name] Date of Encounter: 10/27/2018  Primary Cardiologist: Jenkins Rouge, MD    Subjective   Nausea a bit better  Inpatient Medications    Scheduled Meds: . aspirin EC  81 mg Oral Daily  . atorvastatin  10 mg Oral Daily  . insulin aspart  0-9 Units Subcutaneous Q4H  . insulin detemir  15 Units Subcutaneous BID  . levothyroxine  150 mcg Oral Daily  . losartan  25 mg Oral Daily  . lurasidone  60 mg Oral Daily  . metoprolol tartrate  50 mg Oral BID  . sodium chloride flush  3 mL Intravenous Q12H   Continuous Infusions: . sodium chloride    . 0.9 % NaCl with KCl 40 mEq / L 100 mL/hr (10/27/18 0650)  . sodium chloride 1 mL/kg/hr (10/27/18 0748)  . heparin 1,100 Units/hr (10/27/18 0012)   PRN Meds: sodium chloride, acetaminophen **OR** acetaminophen, hydrALAZINE, ondansetron (ZOFRAN) IV, promethazine, sodium chloride flush   Vital Signs    Vitals:   10/27/18 0343 10/27/18 0426 10/27/18 0427 10/27/18 0653  BP: (!) 158/95     Pulse: 95 (!) 102 (!) 102   Resp: (!) 26 (!) 22 (!) 27   Temp: 98.3 F (36.8 C)     TempSrc: Oral     SpO2: 92% (!) 83% (!) 84%   Weight:    104.4 kg  Height:        Intake/Output Summary (Last 24 hours) at 10/27/2018 0833 Last data filed at 10/26/2018 1300 Gross per 24 hour  Intake -  Output 375 ml  Net -375 ml   Last 3 Weights 10/27/2018 10/26/2018 09/25/2018  Weight (lbs) 230 lb 2.6 oz 228 lb 12.8 oz 260 lb 6.4 oz  Weight (kg) 104.4 kg 103.783 kg 118.117 kg      Telemetry    NSR  - Personally Reviewed  ECG    NSR no acute changes  - Personally Reviewed  Physical Exam  Obese white female  GEN: No acute distress.   Neck: No JVD Cardiac: RRR, no murmurs, rubs, or gallops.  Respiratory: Clear to auscultation bilaterally. GI: Soft, nontender, non-distended  MS: No edema; No deformity. Neuro:  Nonfocal  Psych: Normal affect   Labs    Chemistry Recent Labs  Lab 10/26/18 0200  10/26/18  1127 10/26/18 1746 10/27/18 0253  NA 138   < > 138 138 138  K 2.7*   < > 3.5 3.3* 4.0  CL 98   < > 102 103 110  CO2 22   < > 24 27 21*  GLUCOSE 220*   < > 231* 215* 149*  BUN <5*   < > <5* <5* 6  CREATININE 0.71   < > 0.74 0.69 0.68  CALCIUM 8.7*   < > 8.2* 8.1* 7.9*  PROT 7.1  --   --   --   --   ALBUMIN 3.7  --   --   --   --   AST 48*  --   --   --   --   ALT 31  --   --   --   --   ALKPHOS 69  --   --   --   --   BILITOT 1.1  --   --   --   --   GFRNONAA >60   < > >60 >60 >60  GFRAA >60   < > >60 >60 >60  ANIONGAP 18*   < >  12 8 7    < > = values in this interval not displayed.     Hematology Recent Labs  Lab 10/26/18 0026 10/27/18 0253  WBC 14.7* 23.7*  RBC 7.22* 6.90*  HGB 14.2 13.9  HCT 46.6* 43.5  MCV 64.5* 63.0*  MCH 19.7* 20.1*  MCHC 30.5 32.0  RDW 17.2* 16.6*  PLT 317 253    Cardiac Enzymes Recent Labs  Lab 10/26/18 0200 10/26/18 0622 10/26/18 1127 10/26/18 1746  TROPONINI 0.67* 1.00* 0.98* 0.90*   No results for input(s): TROPIPOC in the last 168 hours.   BNPNo results for input(s): BNP, PROBNP in the last 168 hours.   DDimer No results for input(s): DDIMER in the last 168 hours.   Radiology    Dg Chest 2 View  Result Date: 10/27/2018 CLINICAL DATA:  53 year old female with a history leukocytosis EXAM: CHEST - 2 VIEW COMPARISON:  [facility] [address] [facility] 1,020, CT chest 08/09/2018 FINDINGS: Cardiomediastinal silhouette unchanged in size and contour. No evidence of pneumothorax. Interval development reticulonodular opacities of the bilateral lungs, with opacity at the posterior lung base associated with pleural thickening and meniscus on the lateral view. Similar appearance vascular stent left subclavian artery. IMPRESSION: Pattern of reticulonodular opacity suggests bronchopneumonia, with associated small parapneumonic effusions. Electronically Signed   By: Corrie Mckusick D.O.   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 female with ? Food poisoning nausea and vomiting Elevated troponin and abnormal TTE IDDM for years   Assessment & Plan    SEMI:  ? Symptoms clearly seem to be related to food poisoning 2 hours after eating at [employer] who Was with her also got sick with diarrhea However her troponin is positive and echo is clearly abnormal with septal And apical akinesis. This may be a "stress induced" Takatsubo situation but needs cath today. Continue heparin ASA and beta blocker Troponin peak only 1.0   DM:  Insulin pump per primary service  HLD:  Continue statin   Thyroid :  On replacement check TSH/T4  HTN:  Improved on higher dose beta blocker Losartan 25 mg started in light of abnormal Echo and DM  Elevated WBC:  Check UA and CXR may be from acute gastroenteritis a bit high for MI per primary service  .      For questions or updates, please contact [place] Please consult [email or web address] for contact info under        Signed, Jenkins Rouge, MD  10/27/2018, 8:33 AM

## 2018-10-27 NOTE — Progress Notes (Addendum)
Progress Note  Patient Name: Morgan [name] Date of Encounter: 10/27/2018  Primary Cardiologist: Jenkins Rouge, MD    Subjective   Nausea a bit better  Inpatient Medications    Scheduled Meds: . aspirin EC  81 mg Oral Daily  . atorvastatin  10 mg Oral Daily  . insulin aspart  0-9 Units Subcutaneous Q4H  . insulin detemir  15 Units Subcutaneous BID  . levothyroxine  150 mcg Oral Daily  . losartan  25 mg Oral Daily  . lurasidone  60 mg Oral Daily  . metoprolol tartrate  50 mg Oral BID  . sodium chloride flush  3 mL Intravenous Q12H   Continuous Infusions: . sodium chloride    . 0.9 % NaCl with KCl 40 mEq / L 100 mL/hr (10/27/18 0650)  . sodium chloride 1 mL/kg/hr (10/27/18 0748)  . heparin 1,100 Units/hr (10/27/18 0012)   PRN Meds: sodium chloride, acetaminophen **OR** acetaminophen, hydrALAZINE, ondansetron (ZOFRAN) IV, promethazine, sodium chloride flush   Vital Signs    Vitals:   10/27/18 0343 10/27/18 0426 10/27/18 0427 10/27/18 0653  BP: (!) 158/95     Pulse: 95 (!) 102 (!) 102   Resp: (!) 26 (!) 22 (!) 27   Temp: 98.3 F (36.8 C)     TempSrc: Oral     SpO2: 92% (!) 83% (!) 84%   Weight:    104.4 kg  Height:        Intake/Output Summary (Last 24 hours) at 10/27/2018 0833 Last data filed at 10/26/2018 1300 Gross per 24 hour  Intake -  Output 375 ml  Net -375 ml   Last 3 Weights 10/27/2018 10/26/2018 09/25/2018  Weight (lbs) 230 lb 2.6 oz 228 lb 12.8 oz 260 lb 6.4 oz  Weight (kg) 104.4 kg 103.783 kg 118.117 kg      Telemetry    NSR  - Personally Reviewed  ECG    NSR no acute changes  - Personally Reviewed  Physical Exam  Obese white female  GEN: No acute distress.   Neck: No JVD Cardiac: RRR, no murmurs, rubs, or gallops.  Respiratory: Clear to auscultation bilaterally. GI: Soft, nontender, non-distended  MS: No edema; No deformity. Neuro:  Nonfocal  Psych: Normal affect   Labs    Chemistry Recent Labs  Lab 10/26/18 0200  10/26/18  1127 10/26/18 1746 10/27/18 0253  NA 138   < > 138 138 138  K 2.7*   < > 3.5 3.3* 4.0  CL 98   < > 102 103 110  CO2 22   < > 24 27 21*  GLUCOSE 220*   < > 231* 215* 149*  BUN <5*   < > <5* <5* 6  CREATININE 0.71   < > 0.74 0.69 0.68  CALCIUM 8.7*   < > 8.2* 8.1* 7.9*  PROT 7.1  --   --   --   --   ALBUMIN 3.7  --   --   --   --   AST 48*  --   --   --   --   ALT 31  --   --   --   --   ALKPHOS 69  --   --   --   --   BILITOT 1.1  --   --   --   --   GFRNONAA >60   < > >60 >60 >60  GFRAA >60   < > >60 >60 >60  ANIONGAP 18*   < >  12 8 7    < > = values in this interval not displayed.     Hematology Recent Labs  Lab 10/26/18 0026 10/27/18 0253  WBC 14.7* 23.7*  RBC 7.22* 6.90*  HGB 14.2 13.9  HCT 46.6* 43.5  MCV 64.5* 63.0*  MCH 19.7* 20.1*  MCHC 30.5 32.0  RDW 17.2* 16.6*  PLT 317 253    Cardiac Enzymes Recent Labs  Lab 10/26/18 0200 10/26/18 0622 10/26/18 1127 10/26/18 1746  TROPONINI 0.67* 1.00* 0.98* 0.90*   No results for input(s): TROPIPOC in the last 168 hours.   BNPNo results for input(s): BNP, PROBNP in the last 168 hours.   DDimer No results for input(s): DDIMER in the last 168 hours.   Radiology    Dg Chest 2 View  Result Date: 10/27/2018 CLINICAL DATA:  53 year old female with a history leukocytosis EXAM: CHEST - 2 VIEW COMPARISON:  [facility] [address] [facility] 1,020, CT chest 08/09/2018 FINDINGS: Cardiomediastinal silhouette unchanged in size and contour. No evidence of pneumothorax. Interval development reticulonodular opacities of the bilateral lungs, with opacity at the posterior lung base associated with pleural thickening and meniscus on the lateral view. Similar appearance vascular stent left subclavian artery. IMPRESSION: Pattern of reticulonodular opacity suggests bronchopneumonia, with associated small parapneumonic effusions. Electronically Signed   By: Corrie Mckusick D.O.   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 female with ? Food poisoning nausea and vomiting Elevated troponin and abnormal TTE IDDM for years   Assessment & Plan    SEMI:  ? Symptoms clearly seem to be related to food poisoning 2 hours after eating at [employer] who Was with her also got sick with diarrhea However her troponin is positive and echo is clearly abnormal with septal And apical akinesis. This may be a "stress induced" Takatsubo situation but needs cath today. Continue heparin ASA and beta blocker Troponin peak only 1.0   DM:  Insulin pump per primary service  HLD:  Continue statin   Thyroid :  On replacement check TSH/T4  HTN:  Improved on higher dose beta blocker Losartan 25 mg started in light of abnormal Echo and DM  Elevated WBC:  Check UA and CXR may be from acute gastroenteritis a bit high for MI per primary service  .      For questions or updates, please contact [place] Please consult [email or web address] for contact info under        Signed, Jenkins Rouge, MD  10/27/2018, 8:33 AM

## 2018-10-27 NOTE — Progress Notes (Signed)
Pt began desaturating to low 80's on 2L. I turned her up to 6L with little improvement. Advised by RT to put her on non-rebreather. BP remained high >585 systolic. Gave labetalol x2. Called rapid response nurse who ordered CXR. After results pended, showed new pulmonary edema, received orders from [facility] to give one time dose of IV lasix. Pt has put out >500 cc so far. Breathing has much improved. Pt now @ 97% room air.

## 2018-10-27 NOTE — Interval H&P Note (Signed)
Cath Lab Visit (complete for each Cath Lab visit)  Clinical Evaluation Leading to the Procedure:   ACS: Yes.    Non-ACS:    Anginal Classification: CCS III  Anti-ischemic medical therapy: Minimal Therapy (1 class of medications)  Non-Invasive Test Results: No non-invasive testing performed  Prior CABG: No previous CABG      History and Physical Interval Note:  10/27/2018 2:26 PM  Morgan [name]  has presented today for surgery, with the diagnosis of Unstable angina.  The various methods of treatment have been discussed with the patient and family. After consideration of risks, benefits and other options for treatment, the patient has consented to  Procedure(s): LEFT HEART CATH AND CORONARY ANGIOGRAPHY (N/A) as a surgical intervention.  The patient's history has been reviewed, patient examined, no change in status, stable for surgery.  I have reviewed the patient's chart and labs.  Questions were answered to the patient's satisfaction.     Sherren Mocha

## 2018-10-27 NOTE — Progress Notes (Signed)
Called to the room per primary RN that patient was hypoxic and desaturations were noted. On arrival patient is on a NRB mask and SATs are 96%. Patient has been on RA throughout the day now requiring increase O2 support.  Attempted to wean from the NRB to a Industry and patient tolerated it fairly well. Patient is now on 6L satting 91-92%. Patient shows no objective signs of increase WOB or any type of respiratory compromise. Respirations are even and unlabored. Patient is following commands. Reports that she is nauseated in which RN treated w/ Phenergan. CXR shows Development of pulmonary edema, Left pleural effusion with possible progression. Will monitor patient as needed. Rapid Response Nurse assess patient and is aware of patient current clinical status.

## 2018-10-27 NOTE — Progress Notes (Signed)
PROGRESS NOTE    Morgan [name]  TTS:[id number] DOB: November 16, 1965 DOA: 10/26/2018 PCP: Bradly Bienenstock., MD    Brief Narrative:  She is a 53 year old female with history of insulin-dependent diabetes, hypothyroidism, bipolar disorder and depression, GERD.  Patient presented to the emergency room with 1 day history of intractable nausea and epigastric discomfort after eating outside food.  She took some Pepto-Bismol with some improvement.  She had similar episode in January that did not improve for 3 days so she did not wanted to wait until then so came to the ER.  In the emergency room patient was tachycardic, potassium was 2.7, glucose 220 with anion gap of 18 normal bicarbonate.  Point-of-care troponin were elevated.  Patient received IV fluids and potassium supplements.  she still has some nausea but overall feels better. 10/27/2018: WBC elevated with no particular source.   Assessment & Plan:   Active Problems:   Hypothyroidism   Diabetes mellitus type 2 in obese (HCC)   Bipolar depression (HCC)   Elevated troponin   Intractable nausea and vomiting  Intractable nausea, vomiting: Suspect gastroenteritis, no diarrhea. Aggravated GERD symptoms.  Symptomatic treatment.  Adequate nausea medications.  Adequate IV fluids.  Her symptoms are already improving. Patient recently had similar symptoms.  Underwent EGD.  Found to have grade D esophagitis. Protonix 40 mg twice a day.  Non-STEMI /elevated troponins level.  Suspect type II non-STEMI.  Patient without any chest pain other than epigastric discomfort. Subsequent EKGs with no ischemic changes.  She has received aspirin, Statin and beta-blockers.  Currently denies any chest pain.  Patient is on heparin infusion.  2D echocardiogram with decreased ejection fraction.  Followed by cardiology.  Going for cardiac cath today.  Leukocytosis/abnormal chest x-ray: We will treat as community-acquired pneumonia.  Patient is afebrile.  She had  episodic hypoxia last night.  Had some dry cough but no other pulmonary symptoms.  WBC count is significantly elevated.  No diarrhea.  Because of significant elevated white count, will start patient on doxycycline.  Patient is allergic to penicillin.  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  If she has any cough, will send for culture.  Insulin-dependent diabetes:  Without complications.  Patient uses insulin pump at home.  Currently on reduced dose of long-acting insulin and sliding scale insulin because she is n.p.o.    Hypokalemia: Replace aggressively.  Resolved.  Hypertension: Fairly stable.  Hypothyroidism: Clinically euthyroid on current regimen.  Continue.   DVT prophylaxis: Heparin infusion Code Status: Full code Family Communication: No family at bedside Disposition Plan: Home.  Anticipate tomorrow.   Consultants:   Cardiology.  Procedures:   Cardiac cath scheduled.  Antimicrobials:   Doxycycline 100 mg IV twice a day, 10/27/2018   Subjective: Patient seen and examined.  Still has some nausea.  Denies any diarrhea.  Denies any abdominal pain.  Retrosternal discomfort and epigastric discomfort remains.  Denies any shortness of breath or chest pain.  Afebrile.  Objective: Vitals:   10/27/18 0427 10/27/18 0653 10/27/18 0845 10/27/18 1103  BP:   108/67   Pulse: (!) 102  87   Resp: (!) 27  17 18   Temp:      TempSrc:      SpO2: (!) 84%  92%   Weight:  104.4 kg    Height:        Intake/Output Summary (Last 24 hours) at 10/27/2018 1243 Last data filed at 10/27/2018 0840 Gross per 24 hour  Intake -  Output 575 ml  Net -575 ml   Filed Weights   10/26/18 0611 10/27/18 0653  Weight: 103.8 kg 104.4 kg    Examination:  General exam: Appears calm and comfortable  Respiratory system: Clear to auscultation. Respiratory effort normal. Cardiovascular system: S1 & S2 heard, RRR. No JVD, murmurs, rubs, gallops or clicks. No pedal edema.  Gastrointestinal system: Abdomen is nondistended, soft and nontender. No organomegaly or masses felt. Normal bowel sounds heard.  Obese and pendulous. Central nervous system: Alert and oriented. No focal neurological deficits. Extremities: Symmetric 5 x 5 power. Skin: No rashes, lesions or ulcers Psychiatry: Judgement and insight appear normal. Mood & affect appropriate.     Data Reviewed: I have personally reviewed following labs and imaging studies  CBC: Recent Labs  Lab 10/26/18 0026 10/27/18 0253  WBC 14.7* 23.7*  NEUTROABS 13.5*  --   HGB 14.2 13.9  HCT 46.6* 43.5  MCV 64.5* 63.0*  PLT 317 128   Basic Metabolic Panel: Recent Labs  Lab 10/26/18 0200 10/26/18 0622 10/26/18 0938 10/26/18 1127 10/26/18 1746 10/27/18 0253  NA 138 138 139 138 138 138  K 2.7* 2.9* 3.1* 3.5 3.3* 4.0  CL 98 100 100 102 103 110  CO2 22 24 25 24 27  21*  GLUCOSE 220* 180* 226* 231* 215* 149*  BUN <5* <5* <5* <5* <5* 6  CREATININE 0.71 0.65 0.66 0.74 0.69 0.68  CALCIUM 8.7* 8.0* 8.2* 8.2* 8.1* 7.9*  MG 2.2  --   --   --   --   --    GFR: Estimated Creatinine Clearance: 92 mL/min (by C-G formula based on SCr of 0.68 mg/dL). Liver Function Tests: Recent Labs  Lab 10/26/18 0200  AST 48*  ALT 31  ALKPHOS 69  BILITOT 1.1  PROT 7.1  ALBUMIN 3.7   No results for input(s): LIPASE, AMYLASE in the last 168 hours. No results for input(s): AMMONIA in the last 168 hours. Coagulation Profile: No results for input(s): INR, PROTIME in the last 168 hours. Cardiac Enzymes: Recent Labs  Lab 10/26/18 0200 10/26/18 0622 10/26/18 1127 10/26/18 1746  TROPONINI 0.67* 1.00* 0.98* 0.90*   BNP (last 3 results) No results for input(s): PROBNP in the last 8760 hours. HbA1C: No results for input(s): HGBA1C in the last 72 hours. CBG: Recent Labs  Lab 10/26/18 2001 10/26/18 2334 10/27/18 0211 10/27/18 0340 10/27/18 0927  GLUCAP 198* 181* 157* 163* 137*   Lipid Profile: No results for  input(s): CHOL, HDL, LDLCALC, TRIG, CHOLHDL, LDLDIRECT in the last 72 hours. Thyroid Function Tests: No results for input(s): TSH, T4TOTAL, FREET4, T3FREE, THYROIDAB in the last 72 hours. Anemia Panel: No results for input(s): VITAMINB12, FOLATE, FERRITIN, TIBC, IRON, RETICCTPCT in the last 72 hours. Sepsis Labs: No results for input(s): PROCALCITON, LATICACIDVEN in the last 168 hours.  No results found for this or any previous visit (from the past 240 hour(s)).       Radiology Studies: Dg Chest 2 View  Result Date: 10/27/2018 CLINICAL DATA:  53 year old female with a history leukocytosis EXAM: CHEST - 2 VIEW COMPARISON:  [facility] [address] 1,020, CT chest 08/09/2018 FINDINGS: Cardiomediastinal silhouette unchanged in size and contour. No evidence of pneumothorax. Interval development reticulonodular opacities of the bilateral lungs, with opacity at the posterior lung base associated with pleural thickening and meniscus on the lateral view. Similar appearance vascular stent left subclavian artery. IMPRESSION: Pattern of reticulonodular opacity suggests bronchopneumonia, with associated small parapneumonic effusions. Electronically Signed  By: Corrie Mckusick D.O.   On: 10/27/2018 08:30        Scheduled Meds: . aspirin EC  81 mg Oral Daily  . atorvastatin  10 mg Oral Daily  . insulin aspart  0-9 Units Subcutaneous Q4H  . insulin detemir  15 Units Subcutaneous BID  . levothyroxine  150 mcg Oral Daily  . losartan  25 mg Oral Daily  . lurasidone  60 mg Oral Daily  . metoprolol tartrate  50 mg Oral BID  . sodium chloride flush  3 mL Intravenous Q12H   Continuous Infusions: . sodium chloride    . 0.9 % NaCl with KCl 40 mEq / L 100 mL/hr (10/27/18 0650)  . sodium chloride 1 mL/kg/hr (10/27/18 0748)  . doxycycline (VIBRAMYCIN) IV    . heparin 1,100 Units/hr (10/27/18 0012)     LOS: 0 days    Time spent: 25 minutes.    Barb Merino, MD [facility] Pager  [phone]  If 7PM-7AM, please contact night-coverage [email or web address] Password [facility] 10/27/2018, 12:43 PM

## 2018-10-28 DIAGNOSIS — E876 Hypokalemia: Secondary | ICD-10-CM

## 2018-10-28 DIAGNOSIS — I214 Non-ST elevation (NSTEMI) myocardial infarction: Principal | ICD-10-CM

## 2018-10-28 DIAGNOSIS — J189 Pneumonia, unspecified organism: Secondary | ICD-10-CM

## 2018-10-28 DIAGNOSIS — R0902 Hypoxemia: Secondary | ICD-10-CM

## 2018-10-28 DIAGNOSIS — I5021 Acute systolic (congestive) heart failure: Secondary | ICD-10-CM

## 2018-10-28 DIAGNOSIS — I10 Essential (primary) hypertension: Secondary | ICD-10-CM

## 2018-10-28 DIAGNOSIS — Z955 Presence of coronary angioplasty implant and graft: Secondary | ICD-10-CM

## 2018-10-28 DIAGNOSIS — E785 Hyperlipidemia, unspecified: Secondary | ICD-10-CM

## 2018-10-28 LAB — CBC WITH DIFFERENTIAL/PLATELET
Abs Immature Granulocytes: 0.08 K/uL — ABNORMAL HIGH (ref 0.00–0.07)
Basophils Absolute: 0.1 K/uL (ref 0.0–0.1)
Basophils Relative: 1 %
Eosinophils Absolute: 0 K/uL (ref 0.0–0.5)
Eosinophils Relative: 0 %
HCT: 42.5 % (ref 36.0–46.0)
Hemoglobin: 12.9 g/dL (ref 12.0–15.0)
Immature Granulocytes: 1 %
Lymphocytes Relative: 16 %
Lymphs Abs: 2.2 K/uL (ref 0.7–4.0)
MCH: 19.5 pg — ABNORMAL LOW (ref 26.0–34.0)
MCHC: 30.4 g/dL (ref 30.0–36.0)
MCV: 64.4 fL — ABNORMAL LOW (ref 80.0–100.0)
Monocytes Absolute: 1 K/uL (ref 0.1–1.0)
Monocytes Relative: 8 %
Neutro Abs: 10.2 K/uL — ABNORMAL HIGH (ref 1.7–7.7)
Neutrophils Relative %: 74 %
Platelets: 306 K/uL (ref 150–400)
RBC: 6.6 MIL/uL — ABNORMAL HIGH (ref 3.87–5.11)
RDW: 16.6 % — ABNORMAL HIGH (ref 11.5–15.5)
WBC: 13.7 K/uL — ABNORMAL HIGH (ref 4.0–10.5)
nRBC: 0 % (ref 0.0–0.2)

## 2018-10-28 LAB — BASIC METABOLIC PANEL
Anion gap: 13 (ref 5–15)
BUN: 5 mg/dL — ABNORMAL LOW (ref 6–20)
CO2: 21 mmol/L — ABNORMAL LOW (ref 22–32)
Calcium: 8.1 mg/dL — ABNORMAL LOW (ref 8.9–10.3)
Chloride: 104 mmol/L (ref 98–111)
Creatinine, Ser: 0.67 mg/dL (ref 0.44–1.00)
GFR calc Af Amer: >60 mL/min (ref >60)
GFR calc non Af Amer: >60 mL/min (ref >60)
Glucose, Bld: 241 mg/dL — ABNORMAL HIGH (ref 70–99)
Potassium: 3.2 mmol/L — ABNORMAL LOW (ref 3.5–5.1)
Sodium: 138 mmol/L (ref 135–145)

## 2018-10-28 LAB — GLUCOSE, CAPILLARY
Glucose-Capillary: 193 mg/dL — ABNORMAL HIGH (ref 70–99)
Glucose-Capillary: 211 mg/dL — ABNORMAL HIGH (ref 70–99)
Glucose-Capillary: 214 mg/dL — ABNORMAL HIGH (ref 70–99)
Glucose-Capillary: 218 mg/dL — ABNORMAL HIGH (ref 70–99)
Glucose-Capillary: 242 mg/dL — ABNORMAL HIGH (ref 70–99)
Glucose-Capillary: 269 mg/dL — ABNORMAL HIGH (ref 70–99)

## 2018-10-28 MED ORDER — LOSARTAN POTASSIUM 25 MG PO TABS
25.0000 mg | ORAL_TABLET | Freq: Once | ORAL | Status: AC
  Administered 2018-10-28: 25 mg via ORAL
  Filled 2018-10-28: qty 1

## 2018-10-28 MED ORDER — ATORVASTATIN CALCIUM 80 MG PO TABS
80.0000 mg | ORAL_TABLET | Freq: Every day | ORAL | Status: DC
  Administered 2018-10-29 – 2018-10-31 (×3): 80 mg via ORAL
  Filled 2018-10-28 (×3): qty 1

## 2018-10-28 MED ORDER — POTASSIUM CHLORIDE CRYS ER 20 MEQ PO TBCR
40.0000 meq | EXTENDED_RELEASE_TABLET | Freq: Once | ORAL | Status: AC
  Administered 2018-10-28: 40 meq via ORAL
  Filled 2018-10-28: qty 2

## 2018-10-28 MED ORDER — FUROSEMIDE 10 MG/ML IJ SOLN
20.0000 mg | Freq: Every day | INTRAMUSCULAR | Status: DC
  Administered 2018-10-28 – 2018-10-29 (×2): 20 mg via INTRAVENOUS
  Filled 2018-10-28 (×2): qty 2

## 2018-10-28 MED ORDER — ENSURE ENLIVE PO LIQD
237.0000 mL | Freq: Two times a day (BID) | ORAL | Status: DC
  Administered 2018-10-31: 237 mL via ORAL

## 2018-10-28 MED ORDER — ADULT MULTIVITAMIN W/MINERALS CH
1.0000 | ORAL_TABLET | Freq: Every day | ORAL | Status: DC
  Administered 2018-10-28 – 2018-10-31 (×4): 1 via ORAL
  Filled 2018-10-28 (×4): qty 1

## 2018-10-28 MED ORDER — LOSARTAN POTASSIUM 50 MG PO TABS
50.0000 mg | ORAL_TABLET | Freq: Every day | ORAL | Status: DC
  Administered 2018-10-29 – 2018-10-31 (×3): 50 mg via ORAL
  Filled 2018-10-28 (×3): qty 1
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
## 2018-10-28 NOTE — Progress Notes (Signed)
CARDIAC REHAB PHASE I   PRE:  Rate/Rhythm: 82 sinus rhythm  BP:  Supine:   Sitting: 154/86  Standing:    SaO2: 95%RA   MODE:  Ambulation: 100 ft   POST:  Rate/Rhythem: 99 sinus rhythm BP:  Supine:  Sitting: 124/79  Standing:   SaO2: 99% RA   1515-1625 Pt ambulated in hallway x1 assist using personal cane. Pt c/o dyspnea, fatigue, lightheadedness and weakness. Symptoms resolved upon return to chair.call light in reach.    Education completed including risk factor modification, low fat-low cholesterol diet, exercise, and medication compliance.  Pt oriented to outpatient cardiac rehab.  At pt request, referral will be sent to [facility].  Understanding verbalized   [employer]

## 2018-10-28 NOTE — Progress Notes (Signed)
Progress Note  Patient Name: Morgan [name] Date of Encounter: 10/28/2018  Primary Cardiologist: Jenkins Rouge, MD   Subjective   She said she feels fatigued.  She denies chest pain and shortness of breath.  Inpatient Medications    Scheduled Meds: . aspirin EC  81 mg Oral Daily  . atorvastatin  10 mg Oral Daily  . furosemide  20 mg Intravenous Daily  . insulin aspart  0-9 Units Subcutaneous Q4H  . insulin detemir  15 Units Subcutaneous BID  . lamoTRIgine  50 mg Oral Daily  . levothyroxine  150 mcg Oral Daily  . losartan  25 mg Oral Daily  . lurasidone  60 mg Oral Daily  . metoprolol tartrate  50 mg Oral BID  . potassium chloride  40 mEq Oral Once  . sodium chloride flush  3 mL Intravenous Q12H  . ticagrelor  90 mg Oral BID   Continuous Infusions: . sodium chloride    . doxycycline (VIBRAMYCIN) IV Stopped (10/28/18 0218)   PRN Meds: sodium chloride, acetaminophen **OR** acetaminophen, hydrALAZINE, ondansetron (ZOFRAN) IV, promethazine, sodium chloride flush   Vital Signs    Vitals:   10/27/18 2045 10/27/18 2300 10/28/18 0429 10/28/18 0830  BP: (!) 212/109  (!) 140/104 (!) 170/92  Pulse:  97 94 90  Resp:  20 20 (!) 21  Temp:   (!) 97.5 F (36.4 C) 98.2 F (36.8 C)  TempSrc:   Oral Oral  SpO2:  93% 94% 94%  Weight:   103.2 kg   Height:        Intake/Output Summary (Last 24 hours) at 10/28/2018 0941 Last data filed at 10/28/2018 9767 Gross per 24 hour  Intake 5484.12 ml  Output 2400 ml  Net 3084.12 ml   Filed Weights   10/26/18 0611 10/27/18 0653 10/28/18 0429  Weight: 103.8 kg 104.4 kg 103.2 kg    Telemetry    Sinus rhythm with baseline artifact- Personally Reviewed  ECG    Sinus rhythm with nonspecific ST segment abnormalities- Personally Reviewed  Physical Exam   GEN: No acute distress.   Neck: No JVD Cardiac: RRR, no murmurs, rubs, or gallops.  Respiratory:  Faint crackles at bases bilaterally. GI: Soft, nontender, non-distended  MS: No  edema; No deformity. Neuro:  Nonfocal  Psych: Normal affect   Labs    Chemistry Recent Labs  Lab 10/26/18 0200  10/26/18 1746 10/27/18 0253 10/28/18 0321  NA 138   < > 138 138 138  K 2.7*   < > 3.3* 4.0 3.2*  CL 98   < > 103 110 104  CO2 22   < > 27 21* 21*  GLUCOSE 220*   < > 215* 149* 241*  BUN <5*   < > <5* 6 5*  CREATININE 0.71   < > 0.69 0.68 0.67  CALCIUM 8.7*   < > 8.1* 7.9* 8.1*  PROT 7.1  --   --   --   --   ALBUMIN 3.7  --   --   --   --   AST 48*  --   --   --   --   ALT 31  --   --   --   --   ALKPHOS 69  --   --   --   --   BILITOT 1.1  --   --   --   --   GFRNONAA >60   < > >60 >60 >60  GFRAA >60   < > >  60 >60 >60  ANIONGAP 18*   < > 8 7 13    < > = values in this interval not displayed.     Hematology Recent Labs  Lab 10/26/18 0026 10/27/18 0253 10/28/18 0321  WBC 14.7* 23.7* 13.7*  RBC 7.22* 6.90* 6.60*  HGB 14.2 13.9 12.9  HCT 46.6* 43.5 42.5  MCV 64.5* 63.0* 64.4*  MCH 19.7* 20.1* 19.5*  MCHC 30.5 32.0 30.4  RDW 17.2* 16.6* 16.6*  PLT 317 253 306    Cardiac Enzymes Recent Labs  Lab 10/26/18 0200 10/26/18 0622 10/26/18 1127 10/26/18 1746  TROPONINI 0.67* 1.00* 0.98* 0.90*   No results for input(s): TROPIPOC in the last 168 hours.   BNPNo results for input(s): BNP, PROBNP in the last 168 hours.   DDimer No results for input(s): DDIMER in the last 168 hours.   Radiology    Dg Chest 2 View  Result Date: 10/27/2018 CLINICAL DATA:  53 year old female with a history leukocytosis EXAM: CHEST - 2 VIEW COMPARISON:  [address] [place] [age][place] 1,020, CT chest 08/09/2018 FINDINGS: Cardiomediastinal silhouette unchanged in size and contour. No evidence of pneumothorax. Interval development reticulonodular opacities of the bilateral lungs, with opacity at the posterior lung base associated with pleural thickening and meniscus on the lateral view. Similar appearance vascular stent left subclavian artery. IMPRESSION: Pattern of reticulonodular opacity  suggests bronchopneumonia, with associated small parapneumonic effusions. Electronically Signed   By: Corrie Mckusick D.O.   On: 10/27/2018 08:30   Dg Chest Port 1 View  Result Date: 10/27/2018 CLINICAL DATA:  Hypoxia. EXAM: PORTABLE CHEST 1 VIEW COMPARISON:  Frontal and lateral views earlier this day. FINDINGS: Borderline cardiomegaly with unchanged mediastinal contours. Again seen vascular stents in the region of the left subclavian. Progressive septal thickening and Kerley B-lines consistent with pulmonary edema. Underlying reticulonodular opacities are again seen, primarily in the mid lung zones. Left pleural effusion, possibly increased. No pneumothorax. IMPRESSION: 1. Development of pulmonary edema since earlier this day. Left pleural effusion with possible progression. 2. Background reticulonodular opacities, possible underlying infection. Electronically Signed   By: Keith Rake M.D.   On: 10/27/2018 21:26    Cardiac Studies   Cardiac catheterization 10/27/2018:  1. Severe LAD and diagonal stenoses, treated successfully with DES implantation in each vessel 2. Nonobstructive RCA and LCx stenosis 3. Mildly elevated LVEDP  Recommend: ASA/ticagrelor x 12 months without interruption. OK for hospital DC tomorrow if no early complications arise.    Echocardiogram 10/26/2018:   1. The left ventricle has mildly reduced systolic function, with an ejection fraction of 45-50%. The cavity size was normal. Left ventricular diastolic Doppler parameters are indeterminate. The E/e' is 13.8.  2. Moderate hypokinesis of the left ventricular, mid-apical anterior wall and inferior wall.  3. Severe hypokinesis of the left ventricular, mid-apical anteroseptal wall and inferoseptal wall.  4. The right ventricle has normal systolic function. The cavity was normal. There is no increase in right ventricular wall thickness. Right ventricular systolic pressure could not be assessed.  5. The mitral valve is  abnormal. Mild thickening of the mitral valve leaflet. Mild calcification of the mitral valve leaflet. There is mild mitral annular calcification present.  6. The aortic valve has an indeterminate number of cusps Mild thickening of the aortic valve Mild calcification of the aortic valve. Aortic valve regurgitation was not assessed by color flow Doppler.  7. Aortic valve appears at least functionally if not anatomically bicuspid, but indeterminate on these images.  8. The ascending aorta is  normal in size and structure.  9. The tricuspid valve is grossly normal. 10. The inferior vena cava was dilated in size with >50% respiratory variability.  SUMMARY   EF mildly reduced. Multiple wall motion abnormalities, with severe hypokinesis of the mid to apical antero- and inferoseptum and moderate hypokinesis of the mid to apical anterior and inferior walls. This is concerning for an LAD lesion. No priors for comparison.  Patient Profile     53 y.o. female past medical history significant for diabetes insulin-dependent, hypothyroidism, bipolar disorder and depression who presented emergency department with 1 day history of intractable nausea and epigastric discomfort found to have non-STEMI and is status post PCI of the LAD and diagonal.  Assessment & Plan    1.  Non-STEMI: Status post drug-eluting stent placement to the LAD and diagonal.  Dual antiplatelet therapy with aspirin anticoagulant recommended for 12 months without interruption.  Currently on metoprolol and atorvastatin 10 mg daily.  I will increase to 80 mg daily.  2.  Hypertension: Blood pressure remains elevated.  She had been on losartan 100 mg daily at home.  Currently on 25 mg daily.  I will increase to 50 mg daily.  3.  Acute on chronic systolic heart failure: Developed acute hypoxemic respiratory failure with acute pulmonary edema and received 1 dose of IV Lasix 40 mg.  Currently on 20 mg daily.  4.  Hypokalemia: This is being  replaced.  5.  Hyperlipidemia: I will increase atorvastatin from 10 to 80 mg daily.  6.  Community-acquired pneumonia: She is on doxycycline.  Leukocytosis improving.   Disposition: If she remains stable she could potentially be discharged on 10/29/2018.   For questions or updates, please contact [place] Please consult [email or web address] for contact info under Cardiology/STEMI.      Signed, Kate Sable, MD  10/28/2018, 9:41 AM

## 2018-10-28 NOTE — Progress Notes (Signed)
PROGRESS NOTE    CHERRIL HETT  AYT:[id number] DOB: [birth year] DOA: 10/26/2018 PCP: Bradly Bienenstock., MD    Brief Narrative; 53 year old with past medical history significant for diabetes insulin-dependent, hypothyroidism, bipolar disorder and depression who presented emergency department with 1 day history of intractable nausea and epigastric discomfort after eating at outside restaurant.  She took some Pepto-Bismol without significant improvement.  Patient presented to the ED and she was found to be tachycardic, potassium 2.7, glucose 220 anion gap of 18.  Point-of-care marker troponin were elevated.  Patient underwent cardiac catheterization on 3/27:  LAD and diagonal stenosis treated successfully with a DES implantation, nonobstructive RCA and LCx stenosis.  Mild elevated LVEDP.   Overnight 3/27 patient became hypoxic and was having shortness of breath.  Chest x-ray showed pulmonary edema and pleural effusion.  She require up to 6 L of oxygen.  She received a dose of 40 mg IV Lasix with good urine output almost 2 L.  She is currently on 2 L of oxygen.   Assessment & Plan:   Active Problems:   Hypothyroidism   Diabetes mellitus type 2 in obese (HCC)   Bipolar depression (HCC)   Elevated troponin   Intractable nausea and vomiting   NSTEMI (non-ST elevated myocardial infarction) (HCC)   Intractable nausea vomiting: Might be related to atypical presentation of non-STEMI. Improve.  Continue with PPI.  Possible early DKA; diabetes insulin-dependent: Probably early DKA: Anion gap at 18 ketones in urine, initial CBG 249. She received IV fluids, hypokalemia was corrected, initially received insulin drip.  She is currently now on Levemir.   NSTEMI; patient presented with nausea, elevation of troponin, early DKA. Underwent cardiac cath on 3/27;  LAD and diagonal stenosis treated successfully with a DES implantation, nonobstructive RCA and LCx stenosis.  Mild elevated LVEDP.   -Started on Brilinta. -Cardiology recommending aspirin Brilinta for 12 months without interruption. -Continue with the statins, metoprolol Cozaar.   Acute Hypoxic respiratory failure; the setting of acute pulmonary edema: Received 40 mg of IV Lasix last night with good urine output. She is now requiring only 2 L of oxygen. We will continue with 20 mg of Lasix daily.  PNA; chest x-ray show background reticulonodular opacity, possible underlying infection. She has penicillin allergy, continue with doxycycline. Leukocytosis  trending down.  Hypokalemia: Replete with 40 mEq of potassium x2   Estimated body mass index is 40.95 kg/m as calculated from the following:   Height as of this encounter: 5' 2.5" (1.588 m).   Weight as of this encounter: 103.2 kg.   DVT prophylaxis: SCDs Code Status: Full code Family Communication: Care discussed with patient Disposition Plan: Remain in the hospital for treatment of respiratory failure, non-STEMI.   Consultants:   Cardiology   Procedures:   Cardiac cath 3/27   Antimicrobials:   Doxycycline 3/27   Subjective: Patient not feeling well she is feeling sick, tired.  She currently denies chest pain, at times she experience sporadic chest pain when she take a deep breath.  She denies cough.  Denies sore throat.  Objective: Vitals:   10/27/18 2025 10/27/18 2045 10/27/18 2300 10/28/18 0429  BP:  (!) 212/109  (!) 140/104  Pulse:   97 94  Resp:   20 20  Temp:    (!) 97.5 F (36.4 C)  TempSrc:    Oral  SpO2: 94%  93% 94%  Weight:    103.2 kg  Height:        Intake/Output Summary (  Last 24 hours) at 10/28/2018 0829 Last data filed at 10/28/2018 0600 Gross per 24 hour  Intake 5478.12 ml  Output 2600 ml  Net 2878.12 ml   Filed Weights   10/26/18 0611 10/27/18 0653 10/28/18 0429  Weight: 103.8 kg 104.4 kg 103.2 kg    Examination:  General exam: Appears calm and comfortable  Respiratory system: Normal respiratory effort,  crackles at the bases. Cardiovascular system: S1 & S2 heard, RRR. No JVD, murmurs, rubs, gallops or clicks. No pedal edema. Gastrointestinal system: Abdomen is nondistended, soft and nontender. No organomegaly or masses felt. Normal bowel sounds heard. Central nervous system: Alert and oriented. No focal neurological deficits. Extremities: Symmetric 5 x 5 power. Skin: No rashes, lesions or ulcers    Data Reviewed: I have personally reviewed following labs and imaging studies  CBC: Recent Labs  Lab 10/26/18 0026 10/27/18 0253 10/28/18 0321  WBC 14.7* 23.7* 13.7*  NEUTROABS 13.5*  --  10.2*  HGB 14.2 13.9 12.9  HCT 46.6* 43.5 42.5  MCV 64.5* 63.0* 64.4*  PLT 317 253 161   Basic Metabolic Panel: Recent Labs  Lab 10/26/18 0200  10/26/18 0938 10/26/18 1127 10/26/18 1746 10/27/18 0253 10/28/18 0321  NA 138   < > 139 138 138 138 138  K 2.7*   < > 3.1* 3.5 3.3* 4.0 3.2*  CL 98   < > 100 102 103 110 104  CO2 22   < > 25 24 27  21* 21*  GLUCOSE 220*   < > 226* 231* 215* 149* 241*  BUN <5*   < > <5* <5* <5* 6 5*  CREATININE 0.71   < > 0.66 0.74 0.69 0.68 0.67  CALCIUM 8.7*   < > 8.2* 8.2* 8.1* 7.9* 8.1*  MG 2.2  --   --   --   --   --   --    < > = values in this interval not displayed.   GFR: Estimated Creatinine Clearance: 91.5 mL/min (by C-G formula based on SCr of 0.67 mg/dL). Liver Function Tests: Recent Labs  Lab 10/26/18 0200  AST 48*  ALT 31  ALKPHOS 69  BILITOT 1.1  PROT 7.1  ALBUMIN 3.7   No results for input(s): LIPASE, AMYLASE in the last 168 hours. No results for input(s): AMMONIA in the last 168 hours. Coagulation Profile: No results for input(s): INR, PROTIME in the last 168 hours. Cardiac Enzymes: Recent Labs  Lab 10/26/18 0200 10/26/18 0622 10/26/18 1127 10/26/18 1746  TROPONINI 0.67* 1.00* 0.98* 0.90*   BNP (last 3 results) No results for input(s): PROBNP in the last 8760 hours. HbA1C: No results for input(s): HGBA1C in the last 72 hours.  CBG: Recent Labs  Lab 10/27/18 1248 10/27/18 1705 10/27/18 2033 10/28/18 0019 10/28/18 0426  GLUCAP 124* 129* 261* 269* 218*   Lipid Profile: No results for input(s): CHOL, HDL, LDLCALC, TRIG, CHOLHDL, LDLDIRECT in the last 72 hours. Thyroid Function Tests: No results for input(s): TSH, T4TOTAL, FREET4, T3FREE, THYROIDAB in the last 72 hours. Anemia Panel: No results for input(s): VITAMINB12, FOLATE, FERRITIN, TIBC, IRON, RETICCTPCT in the last 72 hours. Sepsis Labs: No results for input(s): PROCALCITON, LATICACIDVEN in the last 168 hours.  No results found for this or any previous visit (from the past 240 hour(s)).       Radiology Studies: Dg Chest 2 View  Result Date: 10/27/2018 CLINICAL DATA:  53 year old female with a history leukocytosis EXAM: CHEST - 2 VIEW COMPARISON:  [place] [age][place]two  1,020, CT chest 08/09/2018 FINDINGS: Cardiomediastinal silhouette unchanged in size and contour. No evidence of pneumothorax. Interval development reticulonodular opacities of the bilateral lungs, with opacity at the posterior lung base associated with pleural thickening and meniscus on the lateral view. Similar appearance vascular stent left subclavian artery. IMPRESSION: Pattern of reticulonodular opacity suggests bronchopneumonia, with associated small parapneumonic effusions. Electronically Signed   By: Corrie Mckusick D.O.   On: 10/27/2018 08:30   Dg Chest Port 1 View  Result Date: 10/27/2018 CLINICAL DATA:  Hypoxia. EXAM: PORTABLE CHEST 1 VIEW COMPARISON:  Frontal and lateral views earlier this day. FINDINGS: Borderline cardiomegaly with unchanged mediastinal contours. Again seen vascular stents in the region of the left subclavian. Progressive septal thickening and Kerley B-lines consistent with pulmonary edema. Underlying reticulonodular opacities are again seen, primarily in the mid lung zones. Left pleural effusion, possibly increased. No pneumothorax. IMPRESSION: 1. Development of  pulmonary edema since earlier this day. Left pleural effusion with possible progression. 2. Background reticulonodular opacities, possible underlying infection. Electronically Signed   By: Keith Rake M.D.   On: 10/27/2018 21:26        Scheduled Meds: . aspirin EC  81 mg Oral Daily  . atorvastatin  10 mg Oral Daily  . insulin aspart  0-9 Units Subcutaneous Q4H  . insulin detemir  15 Units Subcutaneous BID  . lamoTRIgine  50 mg Oral Daily  . levothyroxine  150 mcg Oral Daily  . losartan  25 mg Oral Daily  . lurasidone  60 mg Oral Daily  . metoprolol tartrate  50 mg Oral BID  . potassium chloride  40 mEq Oral Once  . sodium chloride flush  3 mL Intravenous Q12H  . ticagrelor  90 mg Oral BID   Continuous Infusions: . sodium chloride    . doxycycline (VIBRAMYCIN) IV Stopped (10/28/18 0218)     LOS: 1 day    Time spent: 35 minutes.     Elmarie Shiley, MD [facility] Pager [phone]  If 7PM-7AM, please contact night-coverage [email or web address] Password [facility] 10/28/2018, 8:29 AM

## 2018-10-28 NOTE — Significant Event (Signed)
Rapid Response Event Note  Overview: Respiratory - Hypoxia   Initial Focused Assessment: Called by nurse about having low oxygen saturations with a new requirement of oxygen. Per nurse, patient is on 15L NRB 100% and saturations had now improved to 94-95, prior to this, patient was not on any oxygen.   Upon arrival, patient was resting comfortably, neuro intact, endorsed some mild nausea coupled with a mild headache, but denied shortness of breath, no increased work of breathing, no use of accessory muscles. RR mid 20s.   Lung sounds - clear in the upper airway bilaterally, diminished in the lower bases but overall good air movement. Skin warm and dry, + 2 pulses, + 2 edema in BLE, trace edema in BUE, no JVD, good capillary refill.   VS: BP - hypertensive, has been, BP reading from LE, RN treating. HR in the 90s SR.   Interventions: -- STAT CXR   Plan of Care: -- RN to page [facility] NP on call once CXR has resulted.  -- I called the RN around 2200 to ask the CXR was reported to NP, per CXR + pulmonary edema and worsening pleural effusions. RN paged [place] NP, orders received for Lasix 40 mg IV. I called for update around 0145, per nurse, patient is off oxygen and maintaining.   Event Summary:    at    Call Time 2042 Arrival Time 2044 End Time 2100  Alban Marucci R

## 2018-10-29 LAB — GLUCOSE, CAPILLARY
Glucose-Capillary: 117 mg/dL — ABNORMAL HIGH (ref 70–99)
Glucose-Capillary: 143 mg/dL — ABNORMAL HIGH (ref 70–99)
Glucose-Capillary: 160 mg/dL — ABNORMAL HIGH (ref 70–99)
Glucose-Capillary: 170 mg/dL — ABNORMAL HIGH (ref 70–99)
Glucose-Capillary: 221 mg/dL — ABNORMAL HIGH (ref 70–99)
Glucose-Capillary: 237 mg/dL — ABNORMAL HIGH (ref 70–99)

## 2018-10-29 LAB — HEPATIC FUNCTION PANEL
ALT: 27 U/L (ref 0–44)
AST: 43 U/L — ABNORMAL HIGH (ref 15–41)
Albumin: 3.2 g/dL — ABNORMAL LOW (ref 3.5–5.0)
Alkaline Phosphatase: 70 U/L (ref 38–126)
Bilirubin, Direct: 0.3 mg/dL — ABNORMAL HIGH (ref 0.0–0.2)
Indirect Bilirubin: 1.1 mg/dL — ABNORMAL HIGH (ref 0.3–0.9)
Total Bilirubin: 1.4 mg/dL — ABNORMAL HIGH (ref 0.3–1.2)
Total Protein: 6.4 g/dL — ABNORMAL LOW (ref 6.5–8.1)

## 2018-10-29 LAB — BASIC METABOLIC PANEL
Anion gap: 11 (ref 5–15)
BUN: 8 mg/dL (ref 6–20)
CO2: 23 mmol/L (ref 22–32)
Calcium: 9 mg/dL (ref 8.9–10.3)
Chloride: 103 mmol/L (ref 98–111)
Creatinine, Ser: 0.68 mg/dL (ref 0.44–1.00)
GFR calc Af Amer: >60 mL/min (ref >60)
GFR calc non Af Amer: >60 mL/min (ref >60)
Glucose, Bld: 120 mg/dL — ABNORMAL HIGH (ref 70–99)
Potassium: 3.7 mmol/L (ref 3.5–5.1)
Sodium: 137 mmol/L (ref 135–145)

## 2018-10-29 LAB — CBC
HCT: 43.6 % (ref 36.0–46.0)
Hemoglobin: 13.5 g/dL (ref 12.0–15.0)
MCH: 19.8 pg — ABNORMAL LOW (ref 26.0–34.0)
MCHC: 31 g/dL (ref 30.0–36.0)
MCV: 63.9 fL — ABNORMAL LOW (ref 80.0–100.0)
NRBC: 0.2 % (ref 0.0–0.2)
Platelets: 408 10*3/uL — ABNORMAL HIGH (ref 150–400)
RBC: 6.82 MIL/uL — ABNORMAL HIGH (ref 3.87–5.11)
RDW: 17 % — AB (ref 11.5–15.5)
WBC: 15 10*3/uL — ABNORMAL HIGH (ref 4.0–10.5)

## 2018-10-29 LAB — LIPASE, BLOOD: Lipase: 21 U/L (ref 11–51)

## 2018-10-29 MED ORDER — PANTOPRAZOLE SODIUM 40 MG IV SOLR
40.0000 mg | Freq: Two times a day (BID) | INTRAVENOUS | Status: DC
  Administered 2018-10-29 – 2018-10-31 (×5): 40 mg via INTRAVENOUS
  Filled 2018-10-29 (×5): qty 40

## 2018-10-29 MED ORDER — METOCLOPRAMIDE HCL 5 MG/ML IJ SOLN
5.0000 mg | Freq: Four times a day (QID) | INTRAMUSCULAR | Status: DC | PRN
  Administered 2018-10-29 – 2018-10-30 (×2): 5 mg via INTRAVENOUS
  Filled 2018-10-29 (×2): qty 2

## 2018-10-29 MED ORDER — FUROSEMIDE 20 MG PO TABS
20.0000 mg | ORAL_TABLET | Freq: Every day | ORAL | Status: DC
  Administered 2018-10-30 – 2018-10-31 (×2): 20 mg via ORAL
  Filled 2018-10-29 (×2): qty 1

## 2018-10-29 MED ORDER — POTASSIUM CHLORIDE CRYS ER 20 MEQ PO TBCR
40.0000 meq | EXTENDED_RELEASE_TABLET | Freq: Once | ORAL | Status: AC
  Administered 2018-10-29: 40 meq via ORAL
  Filled 2018-10-29: qty 2

## 2018-10-29 NOTE — Progress Notes (Signed)
PROGRESS NOTE    Morgan [name]  JJK:[id number] DOB: 04/14/1966 DOA: 10/26/2018 PCP: Bradly Bienenstock., MD    Brief Narrative; 53 year old with past medical history significant for diabetes insulin-dependent, hypothyroidism, bipolar disorder and depression who presented emergency department with 1 day history of intractable nausea and epigastric discomfort after eating at outside restaurant.  She took some Pepto-Bismol without significant improvement.  Patient presented to the ED and she was found to be tachycardic, potassium 2.7, glucose 220 anion gap of 18.  Point-of-care marker troponin were elevated.  Patient underwent cardiac catheterization on 3/27:  LAD and diagonal stenosis treated successfully with a DES implantation, nonobstructive RCA and LCx stenosis.  Mild elevated LVEDP.   Overnight 3/27 patient became hypoxic and was having shortness of breath.  Chest x-ray showed pulmonary edema and pleural effusion.  She require up to 6 L of oxygen.  She received a dose of 40 mg IV Lasix with good urine output almost 2 L.  She is currently on 2 L of oxygen.   Assessment & Plan:   Active Problems:   Hypothyroidism   Diabetes mellitus type 2 in obese (HCC)   Bipolar depression ([place])   Elevated troponin   Intractable nausea and vomiting   Non-ST elevation (NSTEMI) myocardial infarction (HCC)   S/P coronary artery stent placement   Acute systolic heart failure (HCC)   Hyperlipidemia LDL goal <70   Community acquired pneumonia   Intractable nausea vomiting: Might be related to atypical presentation of non-STEMI. Improve.  Start PPI.  Still complaining of nausea. Will check LFT, Lipase.  Start Reglan PRN.   Possible early DKA; diabetes insulin-dependent: Probably early DKA: Anion gap at 18 ketones in urine, initial CBG 249. She received IV fluids, hypokalemia was corrected, initially received insulin drip.  She is currently now on Levemir.   NSTEMI; patient presented with  nausea, elevation of troponin, early DKA. Underwent cardiac cath on 3/27;  LAD and diagonal stenosis treated successfully with a DES implantation, nonobstructive RCA and LCx stenosis.  Mild elevated LVEDP.  -Started on Brilinta. -Cardiology recommending aspirin Brilinta for 12 months without interruption. -Continue with the statins, metoprolol Cozaar.   Acute Hypoxic respiratory failure; the setting of acute pulmonary edema: Received 40 mg of IV Lasix last night with good urine output. She is now requiring only 2 L of oxygen. Continue with IV lasix today. Change to oral for tomorrow.   PNA; chest x-ray show background reticulonodular opacity, possible underlying infection. She has penicillin allergy, continue with doxycycline. Leukocytosis  Fluctuates 23----13----15 Afebrile.   Hypokalemia: resolved.    Estimated body mass index is 40.14 kg/m as calculated from the following:   Height as of this encounter: 5' 2.5" (1.588 m).   Weight as of this encounter: 101.2 kg.  PPE; Surgical mask, and gloves.  DVT prophylaxis: SCDs Code Status: Full code Family Communication: Care discussed with patient Disposition Plan: Remain in the hospital for treatment of respiratory failure, non-STEMI.   Consultants:   Cardiology   Procedures:   Cardiac cath 3/27   Antimicrobials:   Doxycycline 3/27   Subjective: She is less letahrgic, feeling less weak.  Report sporadic cough.  Still complaining of nausea. Denies abdominal pain. Loose stool.   Objective: Vitals:   10/28/18 1118 10/28/18 1500 10/28/18 1959 10/29/18 0355  BP: (!) 141/70 112/71 (!) 179/99 (!) 141/75  Pulse: 78 80 95 76  Resp: 17 19 17 18   Temp: 97.8 F (36.6 C) 97.9 F (36.6 C) 98.3  F (36.8 C) 98.2 F (36.8 C)  TempSrc: Oral Oral Oral Oral  SpO2: 100% 100% 96% 94%  Weight:    101.2 kg  Height:        Intake/Output Summary (Last 24 hours) at 10/29/2018 0855 Last data filed at 10/29/2018 0300 Gross per 24  hour  Intake 366 ml  Output 1600 ml  Net -1234 ml   Filed Weights   10/27/18 0653 10/28/18 0429 10/29/18 0355  Weight: 104.4 kg 103.2 kg 101.2 kg    Examination:  General exam: NAD Respiratory system: Bilateral crackles.  Cardiovascular system: S 1, S 2 RRR. Gastrointestinal system: BS present, soft, nt Central nervous system: Non focal.  Extremities: symmetric power.  Skin: no rashes.     Data Reviewed: I have personally reviewed following labs and imaging studies  CBC: Recent Labs  Lab 10/26/18 0026 10/27/18 0253 10/28/18 0321 10/29/18 0754  WBC 14.7* 23.7* 13.7* 15.0*  NEUTROABS 13.5*  --  10.2*  --   HGB 14.2 13.9 12.9 13.5  HCT 46.6* 43.5 42.5 43.6  MCV 64.5* 63.0* 64.4* 63.9*  PLT 317 253 306 008*   Basic Metabolic Panel: Recent Labs  Lab 10/26/18 0200  10/26/18 1127 10/26/18 1746 10/27/18 0253 10/28/18 0321 10/29/18 0754  NA 138   < > 138 138 138 138 137  K 2.7*   < > 3.5 3.3* 4.0 3.2* 3.7  CL 98   < > 102 103 110 104 103  CO2 22   < > 24 27 21* 21* 23  GLUCOSE 220*   < > 231* 215* 149* 241* 120*  BUN <5*   < > <5* <5* 6 5* 8  CREATININE 0.71   < > 0.74 0.69 0.68 0.67 0.68  CALCIUM 8.7*   < > 8.2* 8.1* 7.9* 8.1* 9.0  MG 2.2  --   --   --   --   --   --    < > = values in this interval not displayed.   GFR: Estimated Creatinine Clearance: 90.5 mL/min (by C-G formula based on SCr of 0.68 mg/dL). Liver Function Tests: Recent Labs  Lab 10/26/18 0200  AST 48*  ALT 31  ALKPHOS 69  BILITOT 1.1  PROT 7.1  ALBUMIN 3.7   No results for input(s): LIPASE, AMYLASE in the last 168 hours. No results for input(s): AMMONIA in the last 168 hours. Coagulation Profile: No results for input(s): INR, PROTIME in the last 168 hours. Cardiac Enzymes: Recent Labs  Lab 10/26/18 0200 10/26/18 0622 10/26/18 1127 10/26/18 1746  TROPONINI 0.67* 1.00* 0.98* 0.90*   BNP (last 3 results) No results for input(s): PROBNP in the last 8760 hours. HbA1C: No  results for input(s): HGBA1C in the last 72 hours. CBG: Recent Labs  Lab 10/28/18 1718 10/28/18 2059 10/29/18 0007 10/29/18 0353 10/29/18 0800  GLUCAP 211* 242* 237* 160* 117*   Lipid Profile: No results for input(s): CHOL, HDL, LDLCALC, TRIG, CHOLHDL, LDLDIRECT in the last 72 hours. Thyroid Function Tests: No results for input(s): TSH, T4TOTAL, FREET4, T3FREE, THYROIDAB in the last 72 hours. Anemia Panel: No results for input(s): VITAMINB12, FOLATE, FERRITIN, TIBC, IRON, RETICCTPCT in the last 72 hours. Sepsis Labs: No results for input(s): PROCALCITON, LATICACIDVEN in the last 168 hours.  No results found for this or any previous visit (from the past 240 hour(s)).       Radiology Studies: Dg Chest Port 1 View  Result Date: 10/27/2018 CLINICAL DATA:  Hypoxia. EXAM: PORTABLE CHEST 1  VIEW COMPARISON:  Frontal and lateral views earlier this day. FINDINGS: Borderline cardiomegaly with unchanged mediastinal contours. Again seen vascular stents in the region of the left subclavian. Progressive septal thickening and Kerley B-lines consistent with pulmonary edema. Underlying reticulonodular opacities are again seen, primarily in the mid lung zones. Left pleural effusion, possibly increased. No pneumothorax. IMPRESSION: 1. Development of pulmonary edema since earlier this day. Left pleural effusion with possible progression. 2. Background reticulonodular opacities, possible underlying infection. Electronically Signed   By: Keith Rake M.D.   On: 10/27/2018 21:26        Scheduled Meds: . aspirin EC  81 mg Oral Daily  . atorvastatin  80 mg Oral Daily  . feeding supplement (ENSURE ENLIVE)  237 mL Oral BID BM  . furosemide  20 mg Intravenous Daily  . insulin aspart  0-9 Units Subcutaneous Q4H  . insulin detemir  15 Units Subcutaneous BID  . lamoTRIgine  50 mg Oral Daily  . levothyroxine  150 mcg Oral Daily  . losartan  50 mg Oral Daily  . lurasidone  60 mg Oral Daily  .  metoprolol tartrate  50 mg Oral BID  . multivitamin with minerals  1 tablet Oral Daily  . sodium chloride flush  3 mL Intravenous Q12H  . ticagrelor  90 mg Oral BID   Continuous Infusions: . sodium chloride    . doxycycline (VIBRAMYCIN) IV 100 mg (10/29/18 0038)     LOS: 2 days    Time spent: 35 minutes.     Elmarie Shiley, MD [facility] Pager [phone]  If 7PM-7AM, please contact night-coverage [email or web address] Password [facility] 10/29/2018, 8:55 AM

## 2018-10-29 NOTE — Discharge Instructions (Signed)
With the current coronavirus pandemic, your Cardiology team would like to arrange a follow-up within the next couple of week using a telehealth format (WebEx). Our office will call you to help schedule this appointment. Please see more information about telehealth visits below:  [place]  Approximately 15-20 minutes prior to your scheduled appointment, you will receive an e-mail directly from one of our staff member's [email or web address] e-mail accounts inviting you to join a WebEx meeting.  Please do not reply to that email - simply join the [employer].  Upon joining, a member of the office staff will speak with you initially through the WebEx platform to confirm medications, vital signs for the day and any symptoms you may be experiencing.  Please have this information available prior to the time of visit start.    CONSENT FOR TELE-HEALTH VISIT - PLEASE RVIEW  I hereby voluntarily request, consent and authorize [facility] and its employed or contracted physicians, physician assistants, nurse practitioners or other licensed health care professionals (the Practitioner), to provide me with telemedicine health care services (the Services") as deemed necessary by the treating Practitioner. I acknowledge and consent to receive the Services by the Practitioner via telemedicine. I understand that the telemedicine visit will involve communicating with the Practitioner through live audiovisual communication technology and the disclosure of certain medical information by electronic transmission. I acknowledge that I have been given the opportunity to request an in-person assessment or other available alternative prior to the telemedicine visit and am voluntarily participating in the telemedicine visit.  I understand that I have the right to withhold or withdraw my consent to the use of telemedicine in the course of my care at any time, without affecting my right to future care or treatment,  and that the Practitioner or I may terminate the telemedicine visit at any time. I understand that I have the right to inspect all information obtained and/or recorded in the course of the telemedicine visit and may receive copies of available information for a reasonable fee.  I understand that some of the potential risks of receiving the Services via telemedicine include:   Delay or interruption in medical evaluation due to technological equipment failure or disruption;  Information transmitted may not be sufficient (e.g. poor resolution of images) to allow for appropriate medical decision making by the Practitioner; and/or   In rare instances, security protocols could fail, causing a breach of personal health information.  Furthermore, I acknowledge that it is my responsibility to provide information about my medical history, conditions and care that is complete and accurate to the best of my ability. I acknowledge that Practitioner's advice, recommendations, and/or decision may be based on factors not within their control, such as incomplete or inaccurate data provided by me or distortions of diagnostic images or specimens that may result from electronic transmissions. I understand that the practice of medicine is not an exact science and that Practitioner makes no warranties or guarantees regarding treatment outcomes. I acknowledge that I will receive a copy of this consent concurrently upon execution via email to the email address I last provided but may also request a printed copy by calling the office of [place].    I understand that my insurance will be billed for this visit.   I have read or had this consent read to me.  I understand the contents of this consent, which adequately explains the benefits and risks of the Services being provided via telemedicine.   I  have been provided ample opportunity to ask questions regarding this consent and the Services and have had my questions  answered to my satisfaction.  I give my informed consent for the services to be provided through the use of telemedicine in my medical care  By participating in this telemedicine visit I agree to the above.

## 2018-10-29 NOTE — Progress Notes (Signed)
Progress Note  Patient Name: Morgan [name] Date of Encounter: 10/29/2018  Primary Cardiologist: Jenkins Rouge, MD   Subjective   Denies chest pain and shortness of breath.  Primary complaints relate to GERD and related nausea.  Inpatient Medications    Scheduled Meds:  aspirin EC  81 mg Oral Daily   atorvastatin  80 mg Oral Daily   feeding supplement (ENSURE ENLIVE)  237 mL Oral BID BM   furosemide  20 mg Intravenous Daily   insulin aspart  0-9 Units Subcutaneous Q4H   insulin detemir  15 Units Subcutaneous BID   lamoTRIgine  50 mg Oral Daily   levothyroxine  150 mcg Oral Daily   losartan  50 mg Oral Daily   lurasidone  60 mg Oral Daily   metoprolol tartrate  50 mg Oral BID   multivitamin with minerals  1 tablet Oral Daily   sodium chloride flush  3 mL Intravenous Q12H   ticagrelor  90 mg Oral BID   Continuous Infusions:  sodium chloride     doxycycline (VIBRAMYCIN) IV 100 mg (10/29/18 0038)   PRN Meds: sodium chloride, acetaminophen **OR** acetaminophen, hydrALAZINE, metoCLOPramide (REGLAN) injection, ondansetron (ZOFRAN) IV, promethazine, sodium chloride flush   Vital Signs    Vitals:   10/28/18 1118 10/28/18 1500 10/28/18 1959 10/29/18 0355  BP: (!) 141/70 112/71 (!) 179/99 (!) 141/75  Pulse: 78 80 95 76  Resp: 17 19 17 18   Temp: 97.8 F (36.6 C) 97.9 F (36.6 C) 98.3 F (36.8 C) 98.2 F (36.8 C)  TempSrc: Oral Oral Oral Oral  SpO2: 100% 100% 96% 94%  Weight:    101.2 kg  Height:        Intake/Output Summary (Last 24 hours) at 10/29/2018 0843 Last data filed at 10/29/2018 0300 Gross per 24 hour  Intake 366 ml  Output 1600 ml  Net -1234 ml   Filed Weights   10/27/18 0653 10/28/18 0429 10/29/18 0355  Weight: 104.4 kg 103.2 kg 101.2 kg    Telemetry    Sinus rhythm- Personally Reviewed  ECG    NA - Personally Reviewed  Physical Exam   GEN: No acute distress.   Neck: No JVD Cardiac: RRR, no murmurs, rubs, or gallops.    Respiratory: Clear to auscultation bilaterally. GI: Soft, nontender, non-distended  MS: No edema; No deformity. Neuro:  Nonfocal  Psych: Normal affect   Labs    Chemistry Recent Labs  Lab 10/26/18 0200  10/27/18 0253 10/28/18 0321 10/29/18 0754  NA 138   < > 138 138 137  K 2.7*   < > 4.0 3.2* 3.7  CL 98   < > 110 104 103  CO2 22   < > 21* 21* 23  GLUCOSE 220*   < > 149* 241* 120*  BUN <5*   < > 6 5* 8  CREATININE 0.71   < > 0.68 0.67 0.68  CALCIUM 8.7*   < > 7.9* 8.1* 9.0  PROT 7.1  --   --   --   --   ALBUMIN 3.7  --   --   --   --   AST 48*  --   --   --   --   ALT 31  --   --   --   --   ALKPHOS 69  --   --   --   --   BILITOT 1.1  --   --   --   --  GFRNONAA >60   < > >60 >60 >60  GFRAA >60   < > >60 >60 >60  ANIONGAP 18*   < > 7 13 11    < > = values in this interval not displayed.     Hematology Recent Labs  Lab 10/27/18 0253 10/28/18 0321 10/29/18 0754  WBC 23.7* 13.7* 15.0*  RBC 6.90* 6.60* 6.82*  HGB 13.9 12.9 13.5  HCT 43.5 42.5 43.6  MCV 63.0* 64.4* 63.9*  MCH 20.1* 19.5* 19.8*  MCHC 32.0 30.4 31.0  RDW 16.6* 16.6* 17.0*  PLT 253 306 408*    Cardiac Enzymes Recent Labs  Lab 10/26/18 0200 10/26/18 0622 10/26/18 1127 10/26/18 1746  TROPONINI 0.67* 1.00* 0.98* 0.90*   No results for input(s): TROPIPOC in the last 168 hours.   BNPNo results for input(s): BNP, PROBNP in the last 168 hours.   DDimer No results for input(s): DDIMER in the last 168 hours.   Radiology    Dg Chest Port 1 View  Result Date: 10/27/2018 CLINICAL DATA:  Hypoxia. EXAM: PORTABLE CHEST 1 VIEW COMPARISON:  Frontal and lateral views earlier this day. FINDINGS: Borderline cardiomegaly with unchanged mediastinal contours. Again seen vascular stents in the region of the left subclavian. Progressive septal thickening and Kerley B-lines consistent with pulmonary edema. Underlying reticulonodular opacities are again seen, primarily in the mid lung zones. Left pleural  effusion, possibly increased. No pneumothorax. IMPRESSION: 1. Development of pulmonary edema since earlier this day. Left pleural effusion with possible progression. 2. Background reticulonodular opacities, possible underlying infection. Electronically Signed   By: Keith Rake M.D.   On: 10/27/2018 21:26    Cardiac Studies   Cardiac catheterization 10/27/2018:  1. Severe LAD and diagonal stenoses, treated successfully with DES implantation in each vessel 2. Nonobstructive RCA and LCx stenosis 3. Mildly elevated LVEDP  Recommend: ASA/ticagrelor x 12 months without interruption. OK for hospital DC tomorrow if no early complications arise.    Echocardiogram 10/26/2018:  1. The left ventricle has mildly reduced systolic function, with an ejection fraction of 45-50%. The cavity size was normal. Left ventricular diastolic Doppler parameters are indeterminate. The E/e' is 13.8. 2. Moderate hypokinesis of the left ventricular, mid-apical anterior wall and inferior wall. 3. Severe hypokinesis of the left ventricular, mid-apical anteroseptal wall and inferoseptal wall. 4. The right ventricle has normal systolic function. The cavity was normal. There is no increase in right ventricular wall thickness. Right ventricular systolic pressure could not be assessed. 5. The mitral valve is abnormal. Mild thickening of the mitral valve leaflet. Mild calcification of the mitral valve leaflet. There is mild mitral annular calcification present. 6. The aortic valve has an indeterminate number of cusps Mild thickening of the aortic valve Mild calcification of the aortic valve. Aortic valve regurgitation was not assessed by color flow Doppler. 7. Aortic valve appears at least functionally if not anatomically bicuspid, but indeterminate on these images. 8. The ascending aorta is normal in size and structure. 9. The tricuspid valve is grossly normal. 10. The inferior vena cava was dilated in size with  >50% respiratory variability.  SUMMARY  EF mildly reduced. Multiple wall motion abnormalities, with severe hypokinesis of the mid to apical antero- and inferoseptum and moderate hypokinesis of the mid to apical anterior and inferior walls. This is concerning for an LAD lesion. No priors for comparison.  Patient Profile     53 y.o. female with past medical history significant for diabetes insulin-dependent, hypothyroidism, bipolar disorder and depression who presented emergency  department with 1 day history of intractable nausea and epigastric discomfort found to have non-STEMI and is status post PCI of the LAD and diagonal.v  Assessment & Plan    1.  Non-STEMI: Status post drug-eluting stent placement to the LAD and diagonal.  Dual antiplatelet therapy with aspirin anticoagulant recommended for 12 months without interruption.  Currently on metoprolol and atorvastatin 80 mg daily.   2.  Hypertension: Blood pressure mildly elevated.  She had been on losartan 100 mg daily at home.  I increased to 50 mg daily on 3/28.  No changes today.  3.  Acute on chronic systolic heart failure: Developed acute hypoxemic respiratory failure with acute pulmonary edema and received 1 dose of IV Lasix 40 mg on 3/27. Negative 1234 cc in past 24 hrs. Currently on 20 mg daily.  No changes.  4.  Hypokalemia: K 3.7 today.  5.  Hyperlipidemia: Continue atorvastatin 80 mg daily.  6.  Community-acquired pneumonia: She is on doxycycline.  Leukocytosis stable at 15.  7.  GERD/nausea: Currently on Zofran.  Treatment per primary team.   [facility] will sign off.   Medication Recommendations:  As above Other recommendations (labs, testing, etc):  None Follow up as an outpatient:  Will arrange  For questions or updates, please contact [place] Please consult [email or web address] for contact info under Cardiology/STEMI.      Signed, Kate Sable, MD  10/29/2018, 8:43 AM

## 2018-10-29 NOTE — TOC Initial Note (Addendum)
Transition of Care [facility]) - Initial/Assessment Note    Patient Details  Name: Morgan [name] MRN: [id number] Date of Birth: 1965-12-18  Transition of Care [facility]) CM/SW Contact:    Vinie Sill, [place] Phone Number: 10/29/2018, 10:37 AM  Clinical Narrative:                 CSW spoke with the patient by phone. CSW completed readmission high risk screening. Patient states she gets her prescriptions filled at [facility] in [place] [facility]. Patient explains that some of her medications are delivered. However, with new prescribe medications she has to pick them up and because she does not drive, transportation is a problem. Patient is not eligible for Medicaid or [place] transportation. Patient states her primary care provider is located in [place] and  she used [employer] to get to her appointments.        Barriers to Discharge: Continued Medical Work up   Patient Goals and CMS Choice Patient states their goals for this hospitalization and ongoing recovery are:: getting abck to work       Expected Discharge Plan and Services         Living arrangements for the past 2 months: Single Family Home                 DME Arranged: N/A DME Agency: NA HH Arranged: NA [place] Agency: NA  Prior Living Arrangements/Services Living arrangements for the past 2 months: [place] Lives with:: Self, Adult Children Patient language and need for interpreter reviewed:: No        Need for Family Participation in Patient Care: Yes (Comment) Care giver support system in place?: Yes (comment)   Criminal Activity/Legal Involvement Pertinent to Current Situation/Hospitalization: No - Comment as needed  Activities of Daily Living Home Assistive Devices/Equipment: Cane (specify quad or straight), Walker (specify type), Other (Comment)(Insulin pump) ADL Screening (condition at time of admission) Patient's cognitive ability adequate to safely complete daily activities?: Yes Is the patient deaf or have  difficulty hearing?: No Does the patient have difficulty seeing, even when wearing glasses/contacts?: No Does the patient have difficulty concentrating, remembering, or making decisions?: No Patient able to express need for assistance with ADLs?: Yes Does the patient have difficulty dressing or bathing?: No Independently performs ADLs?: Yes (appropriate for developmental age) Does the patient have difficulty walking or climbing stairs?: Yes Weakness of Legs: Both Weakness of Arms/Hands: None  Permission Sought/Granted Permission sought to share information with : Case Manager, Family Supports Permission granted to share information with : Yes, Verbal Permission Granted  Share Information with NAME: [name]      Permission granted to share info w Relationship: son  Permission granted to share info w Contact Information: [phone]  Emotional Assessment   Attitude/Demeanor/Rapport: Unable to Assess Affect (typically observed): Unable to Assess Orientation: : Oriented to Self, Oriented to Place, Oriented to  Time, Oriented to Situation Alcohol / Substance Use: Not Applicable Psych Involvement: No (comment)  Admission diagnosis:  EKG abnormality [R94.31] Elevated troponin I level [R79.89] Non-intractable vomiting with nausea, unspecified vomiting type [R11.2] Hypokalemia due to excessive gastrointestinal loss of potassium [E87.6] Patient Active Problem List   Diagnosis Date Noted  . S/P coronary artery stent placement   . Acute systolic heart failure ([place])   . Hyperlipidemia LDL goal <70   . Community acquired pneumonia   . Non-ST elevation (NSTEMI) myocardial infarction ([place]) 10/27/2018  . Intractable nausea and vomiting 10/26/2018  . Nausea and vomiting 08/09/2018  .  Elevated troponin 08/09/2018  . Esophagitis determined by endoscopy   . Dysphagia 07/24/2018  . Odynophagia 07/23/2018  . Orthostatic hypotension 07/22/2018  . Tachycardia 07/22/2018  . Hepatic steatosis    . Elevated transaminase level 07/21/2018  . Mild renal insufficiency 07/21/2018  . Leukocytosis 07/21/2018  . Bipolar depression ([place]) 07/21/2018  . Incidental lung nodule 07/21/2018  . Extravasation accident, initial encounter 07/21/2018  . Prolonged QT interval 07/21/2018  . LFTs abnormal   . Sepsis ([place]) 06/27/2018  . Hypokalemia 06/27/2018  . Diabetes mellitus type 2 in obese ([place]) 06/27/2018  . AKI (acute kidney injury) ([place]) 06/27/2018  . Chest pain, rule out acute myocardial infarction 05/14/2016  . Chest pain 05/14/2016  . Hyperlipidemia 05/14/2016  . Hypothyroidism 05/14/2016  . Essential hypertension 05/14/2016   PCP:  Bradly Bienenstock., MD Pharmacy:   [place], [place] - [address] [place] [address] Phone: [phone] Fax: [phone]     Social Determinants of Health (SDOH) Interventions    Readmission Risk Interventions Readmission Risk Prevention Plan 10/29/2018  Transportation Screening Complete  PCP or Specialist Appt within 3-5 Days Complete  HRI or [place] Complete  Social Work Consult for [place] Planning/Counseling Complete  Palliative Care Screening Not Applicable  Medication Review Press photographer) Complete  Some recent data might be hidden

## 2018-10-30 ENCOUNTER — Encounter (HOSPITAL_COMMUNITY): Payer: Self-pay | Admitting: Cardiovascular Disease

## 2018-10-30 LAB — CBC
HCT: 43.4 % (ref 36.0–46.0)
Hemoglobin: 13.2 g/dL (ref 12.0–15.0)
MCH: 19.5 pg — ABNORMAL LOW (ref 26.0–34.0)
MCHC: 30.4 g/dL (ref 30.0–36.0)
MCV: 64.2 fL — ABNORMAL LOW (ref 80.0–100.0)
Platelets: 357 10*3/uL (ref 150–400)
RBC: 6.76 MIL/uL — ABNORMAL HIGH (ref 3.87–5.11)
RDW: 16.9 % — ABNORMAL HIGH (ref 11.5–15.5)
WBC: 12.5 10*3/uL — AB (ref 4.0–10.5)
nRBC: 0.2 % (ref 0.0–0.2)

## 2018-10-30 LAB — BASIC METABOLIC PANEL
Anion gap: 8 (ref 5–15)
BUN: 11 mg/dL (ref 6–20)
CO2: 23 mmol/L (ref 22–32)
Calcium: 8.6 mg/dL — ABNORMAL LOW (ref 8.9–10.3)
Chloride: 104 mmol/L (ref 98–111)
Creatinine, Ser: 0.87 mg/dL (ref 0.44–1.00)
GFR calc non Af Amer: >60 mL/min (ref >60)
Glucose, Bld: 242 mg/dL — ABNORMAL HIGH (ref 70–99)
Potassium: 3.3 mmol/L — ABNORMAL LOW (ref 3.5–5.1)
Sodium: 135 mmol/L (ref 135–145)

## 2018-10-30 LAB — GLUCOSE, CAPILLARY
Glucose-Capillary: 197 mg/dL — ABNORMAL HIGH (ref 70–99)
Glucose-Capillary: 228 mg/dL — ABNORMAL HIGH (ref 70–99)
Glucose-Capillary: 233 mg/dL — ABNORMAL HIGH (ref 70–99)
Glucose-Capillary: 275 mg/dL — ABNORMAL HIGH (ref 70–99)
Glucose-Capillary: 316 mg/dL — ABNORMAL HIGH (ref 70–99)
Glucose-Capillary: 84 mg/dL (ref 70–99)

## 2018-10-30 MED ORDER — INSULIN ASPART 100 UNIT/ML ~~LOC~~ SOLN
0.0000 [IU] | Freq: Three times a day (TID) | SUBCUTANEOUS | Status: DC
  Administered 2018-10-30: 7 [IU] via SUBCUTANEOUS
  Administered 2018-10-31: 2 [IU] via SUBCUTANEOUS

## 2018-10-30 MED ORDER — DOXYCYCLINE HYCLATE 100 MG PO TABS
100.0000 mg | ORAL_TABLET | Freq: Two times a day (BID) | ORAL | Status: DC
  Administered 2018-10-30 – 2018-10-31 (×3): 100 mg via ORAL
  Filled 2018-10-30 (×3): qty 1

## 2018-10-30 MED ORDER — POTASSIUM CHLORIDE CRYS ER 20 MEQ PO TBCR
40.0000 meq | EXTENDED_RELEASE_TABLET | Freq: Once | ORAL | Status: AC
  Administered 2018-10-30: 40 meq via ORAL
  Filled 2018-10-30: qty 2

## 2018-10-30 NOTE — Progress Notes (Signed)
Inpatient Diabetes Program Recommendations  AACE/ADA: New Consensus Statement on Inpatient Glycemic Control (2015)  Target Ranges:  Prepandial:   less than 140 mg/dL      Peak postprandial:   less than 180 mg/dL (1-2 hours)      Critically ill patients:  140 - 180 mg/dL   Lab Results  Component Value Date   GLUCAP 233 (H) 10/30/2018   HGBA1C 10.4 (H) 07/26/2018    Review of Glycemic Control Results for Morgan [name], Morgan [name] (MRN [id number]) as of 10/30/2018 14:59  Ref. Range 10/29/2018 19:53 10/30/2018 00:03 10/30/2018 04:01 10/30/2018 08:04 10/30/2018 12:23  Glucose-Capillary Latest Ref Range: 70 - 99 mg/dL 170 (H) 228 (H) 197 (H) 84 233 (H)   Diabetes history: DM2 Outpatient Diabetes medications: Medtronic insulin pump (Humalog), Ozempic 1 mg every week Current orders for Inpatient glycemic control: Levemir 15 units BID, Novolog SENSITIVE correction scale every 4 hours.  Inpatient Diabetes Program Recommendations:   Please consider since patient is now eating: -Change Novolog correction to tid sensitive + hs 0-5 units -May need meal coverage 3 units tid added if consistently eating 50%  Thank you, Bethena Roys E. Zeynep Fantroy, RN, MSN, CDE  Diabetes Coordinator Inpatient Glycemic Control Team Team Pager [phone] (8am-5pm) 10/30/2018 3:02 PM

## 2018-10-30 NOTE — Progress Notes (Signed)
[id number] Offered to walk pt but she declined at this time. She is afraid that she will have diarrhea as her stomach is "rumbling". Encouraged her to walk with staff later if feeling better. Reviewed MI education-- NTG use, how to take brilinta and importance, CRP 2 and walking for ex. Has written guidelines. Pt has referral to [facility] and knows she will be notified when program reopens as closed temporarily due to [place]. Pt to follow her written ex guidelines until then. Voiced understanding. Denied questions re diet. Graylon Good RN BSN 10/30/2018 9:48 AM

## 2018-10-30 NOTE — Progress Notes (Signed)
PROGRESS NOTE    Morgan [name]  GGY:[id number] DOB: October 27, 1965 DOA: 10/26/2018 PCP: Bradly Bienenstock., MD    Brief Narrative; 53 year old with past medical history significant for diabetes insulin-dependent, hypothyroidism, bipolar disorder and depression who presented emergency department with 1 day history of intractable nausea and epigastric discomfort after eating at outside restaurant.  She took some Pepto-Bismol without significant improvement.  Patient presented to the ED and she was found to be tachycardic, potassium 2.7, glucose 220 anion gap of 18.  Point-of-care marker troponin were elevated.  Patient underwent cardiac catheterization on 3/27:  LAD and diagonal stenosis treated successfully with a DES implantation, nonobstructive RCA and LCx stenosis.  Mild elevated LVEDP.   Overnight 3/27 patient became hypoxic and was having shortness of breath.  Chest x-ray showed pulmonary edema and pleural effusion.  She require up to 6 L of oxygen.  She received a dose of 40 mg IV Lasix with good urine output almost 2 L.  She is currently on 2 L of oxygen.   Assessment & Plan:   Active Problems:   Hypothyroidism   Diabetes mellitus type 2 in obese (HCC)   Bipolar depression ([place])   Elevated troponin   Intractable nausea and vomiting   Non-ST elevation (NSTEMI) myocardial infarction (HCC)   S/P coronary artery stent placement   Acute systolic heart failure (HCC)   Hyperlipidemia LDL goal <70   Community acquired pneumonia   Intractable nausea vomiting: Might be related to atypical presentation of non-STEMI. Start PPI.  LFT no significant elevation.  lipase normal.  Started  Reglan PRN.  She is feeling better today, less nausea. Plan to eat breakfast today.  Monitor overnight to make sure patient is able to tolerate oral intake. patient vomited yesterday.   Possible early DKA; diabetes insulin-dependent: Probably early DKA: Anion gap at 18 ketones in urine, initial CBG  249. She received IV fluids, hypokalemia was corrected, initially received insulin drip.  She is currently now on Levemir.   NSTEMI; patient presented with nausea, elevation of troponin, early DKA. Underwent cardiac cath on 3/27;  LAD and diagonal stenosis treated successfully with a DES implantation, nonobstructive RCA and LCx stenosis.  Mild elevated LVEDP.  -Started on Brilinta. -Cardiology recommending aspirin Brilinta for 12 months without interruption. -Continue with the statins, metoprolol Cozaar.   Acute Hypoxic respiratory failure; the setting of acute pulmonary edema: Received 40 mg of IV Lasix last night with good urine output. She is now requiring only 2 L of oxygen. Received IV lasix. Change to oral lasix today.   PNA; chest x-ray show background reticulonodular opacity, possible underlying infection. She has penicillin allergy, continue with doxycycline. Leukocytosis  Fluctuates 23----13----15---12 Afebrile.   Hypokalemia; oral replacement.    Estimated body mass index is 39.92 kg/m as calculated from the following:   Height as of this encounter: 5' 2.5" (1.588 m).   Weight as of this encounter: 100.6 kg.  PPE; Surgical mask, and gloves.  DVT prophylaxis: SCDs Code Status: Full code Family Communication: Care discussed with patient Disposition Plan: Remain in the hospital for treatment of respiratory failure, non-STEMI.   Consultants:   Cardiology   Procedures:   Cardiac cath 3/27   Antimicrobials:   Doxycycline 3/27   Subjective: She vomited yesterday. She denies abdominal pain. She feels less nauseous today.   Objective: Vitals:   10/29/18 0919 10/29/18 1547 10/29/18 2151 10/30/18 0404  BP: (!) 186/89 (!) 157/93 125/61 (!) 97/58  Pulse:  89 77 72  Resp:  20 16 16   Temp:  98.5 F (36.9 C) 98.3 F (36.8 C) 97.6 F (36.4 C)  TempSrc:  Oral  Oral  SpO2:  96% 98% 94%  Weight:    100.6 kg  Height:        Intake/Output Summary (Last 24  hours) at 10/30/2018 0846 Last data filed at 10/29/2018 2151 Gross per 24 hour  Intake 3 ml  Output 1200 ml  Net -1197 ml   Filed Weights   10/28/18 0429 10/29/18 0355 10/30/18 0404  Weight: 103.2 kg 101.2 kg 100.6 kg    Examination:  General exam: NAD Respiratory system: Bilateral crackles.  Cardiovascular system: S 1, S 2 RRR Gastrointestinal system: BS, present, soft, nt Central nervous system: non focal.  Extremities: Symmetric power.  Skin: no rashes.     Data Reviewed: I have personally reviewed following labs and imaging studies  CBC: Recent Labs  Lab 10/26/18 0026 10/27/18 0253 10/28/18 0321 10/29/18 0754 10/30/18 0249  WBC 14.7* 23.7* 13.7* 15.0* 12.5*  NEUTROABS 13.5*  --  10.2*  --   --   HGB 14.2 13.9 12.9 13.5 13.2  HCT 46.6* 43.5 42.5 43.6 43.4  MCV 64.5* 63.0* 64.4* 63.9* 64.2*  PLT 317 253 306 408* 193   Basic Metabolic Panel: Recent Labs  Lab 10/26/18 0200  10/26/18 1746 10/27/18 0253 10/28/18 0321 10/29/18 0754 10/30/18 0249  NA 138   < > 138 138 138 137 135  K 2.7*   < > 3.3* 4.0 3.2* 3.7 3.3*  CL 98   < > 103 110 104 103 104  CO2 22   < > 27 21* 21* 23 23  GLUCOSE 220*   < > 215* 149* 241* 120* 242*  BUN <5*   < > <5* 6 5* 8 11  CREATININE 0.71   < > 0.69 0.68 0.67 0.68 0.87  CALCIUM 8.7*   < > 8.1* 7.9* 8.1* 9.0 8.6*  MG 2.2  --   --   --   --   --   --    < > = values in this interval not displayed.   GFR: Estimated Creatinine Clearance: 82.9 mL/min (by C-G formula based on SCr of 0.87 mg/dL). Liver Function Tests: Recent Labs  Lab 10/26/18 0200 10/29/18 0852  AST 48* 43*  ALT 31 27  ALKPHOS 69 70  BILITOT 1.1 1.4*  PROT 7.1 6.4*  ALBUMIN 3.7 3.2*   Recent Labs  Lab 10/29/18 0852  LIPASE 21   No results for input(s): AMMONIA in the last 168 hours. Coagulation Profile: No results for input(s): INR, PROTIME in the last 168 hours. Cardiac Enzymes: Recent Labs  Lab 10/26/18 0200 10/26/18 0622 10/26/18 1127  10/26/18 1746  TROPONINI 0.67* 1.00* 0.98* 0.90*   BNP (last 3 results) No results for input(s): PROBNP in the last 8760 hours. HbA1C: No results for input(s): HGBA1C in the last 72 hours. CBG: Recent Labs  Lab 10/29/18 1613 10/29/18 1953 10/30/18 0003 10/30/18 0401 10/30/18 0804  GLUCAP 143* 170* 228* 197* 84   Lipid Profile: No results for input(s): CHOL, HDL, LDLCALC, TRIG, CHOLHDL, LDLDIRECT in the last 72 hours. Thyroid Function Tests: No results for input(s): TSH, T4TOTAL, FREET4, T3FREE, THYROIDAB in the last 72 hours. Anemia Panel: No results for input(s): VITAMINB12, FOLATE, FERRITIN, TIBC, IRON, RETICCTPCT in the last 72 hours. Sepsis Labs: No results for input(s): PROCALCITON, LATICACIDVEN in the last 168 hours.  No results found for this or any previous  visit (from the past 240 hour(s)).       Radiology Studies: No results found.      Scheduled Meds: . aspirin EC  81 mg Oral Daily  . atorvastatin  80 mg Oral Daily  . doxycycline  100 mg Oral Q12H  . feeding supplement (ENSURE ENLIVE)  237 mL Oral BID BM  . furosemide  20 mg Oral Daily  . insulin aspart  0-9 Units Subcutaneous Q4H  . insulin detemir  15 Units Subcutaneous BID  . lamoTRIgine  50 mg Oral Daily  . levothyroxine  150 mcg Oral Daily  . losartan  50 mg Oral Daily  . lurasidone  60 mg Oral Daily  . metoprolol tartrate  50 mg Oral BID  . multivitamin with minerals  1 tablet Oral Daily  . pantoprazole (PROTONIX) IV  40 mg Intravenous Q12H  . potassium chloride  40 mEq Oral Once  . sodium chloride flush  3 mL Intravenous Q12H  . ticagrelor  90 mg Oral BID   Continuous Infusions: . sodium chloride       LOS: 3 days    Time spent: 35 minutes.     Elmarie Shiley, MD [facility] Pager [phone]  If 7PM-7AM, please contact night-coverage [email or web address] Password [facility] 10/30/2018, 8:46 AM

## 2018-10-30 NOTE — Progress Notes (Signed)
PHARMACIST - PHYSICIAN COMMUNICATION  CONCERNING: Antibiotic IV to Oral Route Change Policy  RECOMMENDATION: This patient is receiving doxycycline by the intravenous route.  Based on criteria approved by the Pharmacy and Therapeutics Committee, the antibiotic(s) is/are being converted to the equivalent oral dose form(s).   DESCRIPTION: These criteria include:  Patient being treated for a respiratory tract infection, urinary tract infection, cellulitis or clostridium difficile associated diarrhea if on metronidazole  The patient is not neutropenic and does not exhibit a GI malabsorption state  The patient is eating (either orally or via tube) and/or has been taking other orally administered medications for a least 24 hours  The patient is improving clinically and has a Tmax < 100.5  If you have questions about this conversion, please contact the Pharmacy Department  []   [phone] )  [facility] []   [phone] )  [facility] [x]   [phone] )  [facility] []   [phone] )  [facility] []   [phone] )  [place], PharmD Clinical Pharmacist **Pharmacist phone directory can now be found on [email or web address] ([email or web address]).  Listed under [place].

## 2018-10-31 LAB — BASIC METABOLIC PANEL
Anion gap: 14 (ref 5–15)
BUN: 13 mg/dL (ref 6–20)
CALCIUM: 9.2 mg/dL (ref 8.9–10.3)
CO2: 22 mmol/L (ref 22–32)
Chloride: 102 mmol/L (ref 98–111)
Creatinine, Ser: 0.94 mg/dL (ref 0.44–1.00)
GFR calc Af Amer: >60 mL/min (ref >60)
GFR calc non Af Amer: >60 mL/min (ref >60)
Glucose, Bld: 166 mg/dL — ABNORMAL HIGH (ref 70–99)
Potassium: 3.2 mmol/L — ABNORMAL LOW (ref 3.5–5.1)
Sodium: 138 mmol/L (ref 135–145)

## 2018-10-31 LAB — GLUCOSE, CAPILLARY: Glucose-Capillary: 155 mg/dL — ABNORMAL HIGH (ref 70–99)

## 2018-10-31 MED ORDER — PROMETHAZINE HCL 25 MG PO TABS
12.5000 mg | ORAL_TABLET | Freq: Four times a day (QID) | ORAL | Status: DC | PRN
  Administered 2018-10-31: 12.5 mg via ORAL
  Filled 2018-10-31: qty 1

## 2018-10-31 MED ORDER — POTASSIUM CHLORIDE ER 20 MEQ PO TBCR: 20.0000 meq | EXTENDED_RELEASE_TABLET | Freq: Every day | ORAL | 0 refills | Status: DC

## 2018-10-31 MED ORDER — TRAMADOL HCL 50 MG PO TABS: 25.0000 mg | ORAL_TABLET | Freq: Two times a day (BID) | ORAL | Status: DC | PRN

## 2018-10-31 MED ORDER — METOPROLOL TARTRATE 50 MG PO TABS: 50.0000 mg | ORAL_TABLET | Freq: Two times a day (BID) | ORAL | 0 refills | Status: DC

## 2018-10-31 MED ORDER — DOXYCYCLINE HYCLATE 100 MG PO TABS: 100.0000 mg | ORAL_TABLET | Freq: Two times a day (BID) | ORAL | 0 refills | Status: DC

## 2018-10-31 MED ORDER — PANTOPRAZOLE SODIUM 40 MG PO TBEC: 40.0000 mg | DELAYED_RELEASE_TABLET | Freq: Two times a day (BID) | ORAL | Status: DC

## 2018-10-31 MED ORDER — ATORVASTATIN CALCIUM 80 MG PO TABS: 80.0000 mg | ORAL_TABLET | Freq: Every day | ORAL | 0 refills | Status: DC

## 2018-10-31 MED ORDER — LOSARTAN POTASSIUM 50 MG PO TABS: 50.0000 mg | ORAL_TABLET | Freq: Every day | ORAL | 0 refills | Status: DC

## 2018-10-31 MED ORDER — POTASSIUM CHLORIDE ER 20 MEQ PO TBCR: 40.0000 meq | EXTENDED_RELEASE_TABLET | Freq: Every day | ORAL | 0 refills | Status: DC

## 2018-10-31 MED ORDER — FUROSEMIDE 20 MG PO TABS: 20.0000 mg | ORAL_TABLET | Freq: Every day | ORAL | 0 refills | Status: DC

## 2018-10-31 MED ORDER — TICAGRELOR 90 MG PO TABS: 90.0000 mg | ORAL_TABLET | Freq: Two times a day (BID) | ORAL | 0 refills | Status: DC

## 2018-10-31 MED ORDER — PROMETHAZINE HCL 12.5 MG PO TABS: 12.5000 mg | ORAL_TABLET | Freq: Four times a day (QID) | ORAL | 0 refills | Status: DC | PRN

## 2018-10-31 MED ORDER — ASPIRIN 81 MG PO TABS: 81.0000 mg | ORAL_TABLET | Freq: Every day | ORAL | 1 refills | Status: DC

## 2018-10-31 MED ORDER — POTASSIUM CHLORIDE CRYS ER 20 MEQ PO TBCR
40.0000 meq | EXTENDED_RELEASE_TABLET | Freq: Once | ORAL | Status: AC
  Administered 2018-10-31: 40 meq via ORAL
  Filled 2018-10-31: qty 2

## 2018-10-31 MED ORDER — TRAMADOL HCL 50 MG PO TABS: 25.0000 mg | ORAL_TABLET | Freq: Two times a day (BID) | ORAL | 0 refills | Status: DC | PRN

## 2018-10-31 NOTE — Discharge Summary (Signed)
Physician Discharge Summary  Morgan [name] IOX:[id number] DOB: 02-04-1966 DOA: 10/26/2018  PCP: Bradly Bienenstock., MD  Admit date: 10/26/2018 Discharge date: 10/31/2018  Admitted From: Home  Disposition: Home   Recommendations for Outpatient Follow-up:  1. Follow up with PCP in 1-2 weeks 2. Please obtain BMP/CBC in one week 3. Patient needs to follow-up with cardiology post MI. 4. Need to follow-up B-met  now that she was discharged on Lasix. 5.   Home Health: none  Discharge Condition: stable.  CODE STATUS: full code Diet recommendation: Carb Modified   Brief/Interim Summary:  53 year old with past medical history significant for diabetes insulin-dependent, hypothyroidism, bipolar disorder and depression who presented emergency department with 1 day history of intractable nausea and epigastric discomfort after eating at outside restaurant.  She took some Pepto-Bismol without significant improvement.  Patient presented to the ED and she was found to be tachycardic, potassium 2.7, glucose 220 anion gap of 18.  Point-of-care marker troponin were elevated.  Patient underwent cardiac catheterization on 3/27:  LAD and diagonal stenosis treated successfully with a DES implantation, nonobstructive RCA and LCx stenosis.  Mild elevated LVEDP.   Overnight 3/27 patient became hypoxic and was having shortness of breath.  Chest x-ray showed pulmonary edema and pleural effusion.  She require up to 6 L of oxygen.  She received a dose of 40 mg IV Lasix with good urine output almost 2 L.  She is currently on 2 L of oxygen.  Intractable nausea vomiting: Might be related to atypical presentation of non-STEMI. Continue with PPI.  LFT no significant elevation.  lipase normal.  Patient report nausea slowly improving.  She has been able to tolerate diet although poor oral intake. She reports that Phenergan helps with nausea.   Possible early DKA; diabetes insulin-dependent: Probably early  DKA: Anion gap at 18 ketones in urine, initial CBG 249. She received IV fluids, hypokalemia was corrected, initially received insulin drip.  She received Levemir while in the hospital, she will be discharged with her insulin pump.  NSTEMI; patient presented with nausea, elevation of troponin, early DKA. Underwent cardiac cath on 3/27;  LAD and diagonal stenosis treated successfully with a DES implantation, nonobstructive RCA and LCx stenosis.  Mild elevated LVEDP.  -Started on Brilinta. -Cardiology recommending aspirin Brilinta for 12 months without interruption. -Continue with the statins, metoprolol Cozaar.   Acute Hypoxic respiratory failure; the setting of acute pulmonary edema: Received 40 mg of IV Lasix last night with good urine output. She is now requiring only 2 L of oxygen. Received IV lasix. Change to oral lasix.  Resolved.  She will be discharged on oral Lasix and oral potassium supplement.  PNA; chest x-ray show background reticulonodular opacity, possible underlying infection. She has penicillin allergy, continue with doxycycline. Leukocytosis  Fluctuates 23----13----15---12 Afebrile.  She has received 4 days of oral doxycycline.  She will be discharged 1 more day of doxycycline.   Hypokalemia; oral replacement.   He will be discharged on oral supplement.   Discharge Diagnoses:  Active Problems:   Hypothyroidism   Diabetes mellitus type 2 in obese (HCC)   Bipolar depression ([place])   Elevated troponin   Intractable nausea and vomiting   Non-ST elevation (NSTEMI) myocardial infarction (HCC)   S/P coronary artery stent placement   Acute systolic heart failure ([place])   Hyperlipidemia LDL goal <70   Community acquired pneumonia    Discharge Instructions  Discharge Instructions    Amb Referral to Cardiac Rehabilitation   Complete  by:  As directed    Diagnosis:   NSTEMI Coronary Stents     Diet - low sodium heart healthy   Complete by:  As directed     Increase activity slowly   Complete by:  As directed      Allergies as of 10/31/2018      Reactions   Ilosone [erythromycin] Anaphylaxis   Keflex [cephalexin] Anaphylaxis   Penicillins Anaphylaxis   Has patient had a PCN reaction causing immediate rash, facial/tongue/throat swelling, SOB or lightheadedness with hypotension: YES Has patient had a PCN reaction causing severe rash involving mucus membranes or skin necrosis: NO Has patient had a PCN reaction that required hospitalizationNO Has patient had a PCN reaction occurring within the last 10 years: NO If all of the above answers are "NO", then may proceed with Cephalosporin use.   Iron Other (See Comments)   Pt reports condition limiting Iron intake.   Metformin And Related Other (See Comments)   Pt prefers to not take this medication   Morphine And Related Other (See Comments)   Pt reports hallucinations      Medication List    STOP taking these medications   QUEtiapine 50 MG tablet Commonly known as:  SEROQUEL     TAKE these medications   aspirin 81 MG tablet Take 1 tablet (81 mg total) by mouth daily.   atorvastatin 80 MG tablet Commonly known as:  LIPITOR Take 1 tablet (80 mg total) by mouth daily. What changed:    medication strength  how much to take   butalbital-acetaminophen-caffeine 50-325-40 MG tablet Commonly known as:  FIORICET, ESGIC Take 1-2 tablets by mouth every 6 (six) hours as needed for headache or migraine.   Contour Next One Kit 1 kit by Does not apply route as directed.   doxepin 25 MG capsule Commonly known as:  SINEQUAN Take 50 mg by mouth at bedtime.   doxycycline 100 MG tablet Commonly known as:  VIBRA-TABS Take 1 tablet (100 mg total) by mouth every 12 (twelve) hours.   EPINEPHrine 0.3 mg/0.3 mL Soaj injection Commonly known as:  EPI-PEN Inject 0.3 mg into the muscle once as needed (for an anaphylactic reaction).   FLUoxetine 20 MG tablet Commonly known as:  PROZAC Take 20 mg  by mouth every morning.   furosemide 20 MG tablet Commonly known as:  LASIX Take 1 tablet (20 mg total) by mouth daily.   Glucagon Emergency 1 MG injection Generic drug:  glucagon Inject 1 mg into the vein once as needed (FOR ONSET OF HYPOGLYCEMIA).   insulin pump Soln Inject into the skin continuous. Humalog   lamoTRIgine 25 MG tablet Commonly known as:  LAMICTAL Take 50 mg by mouth daily.   Latuda 60 MG Tabs Generic drug:  Lurasidone HCl Take 60 mg by mouth daily.   levothyroxine 150 MCG tablet Commonly known as:  SYNTHROID, LEVOTHROID Take 150 mcg by mouth daily.   losartan 50 MG tablet Commonly known as:  COZAAR Take 1 tablet (50 mg total) by mouth daily. What changed:    medication strength  how much to take   metoprolol tartrate 50 MG tablet Commonly known as:  LOPRESSOR Take 1 tablet (50 mg total) by mouth 2 (two) times daily.   multivitamin with minerals Tabs tablet Take 1 tablet by mouth daily.   nitroGLYCERIN 0.4 MG SL tablet Commonly known as:  NITROSTAT Place 1 tablet (0.4 mg total) under the tongue every 5 (five) minutes x 3 doses as  needed for chest pain.   Ozempic (0.25 or 0.5 MG/DOSE) 2 MG/1.5ML Sopn Generic drug:  Semaglutide(0.25 or 0.5MG/DOS) Inject 0.5 mg into the skin once a week. On Monday   pantoprazole 40 MG tablet Commonly known as:  PROTONIX Take 1 tablet (40 mg total) by mouth 2 (two) times daily before a meal. X 8 weeks   Potassium Chloride ER 20 MEQ Tbcr Take 40 mEq by mouth daily.   promethazine 12.5 MG tablet Commonly known as:  PHENERGAN Take 1 tablet (12.5 mg total) by mouth every 6 (six) hours as needed for nausea or vomiting.   ticagrelor 90 MG Tabs tablet Commonly known as:  BRILINTA Take 1 tablet (90 mg total) by mouth 2 (two) times daily.   traMADol 50 MG tablet Commonly known as:  ULTRAM Take 0.5 tablets (25 mg total) by mouth every 12 (twelve) hours as needed for moderate pain.   Vitamin D3 50 MCG (2000 UT)  Tabs Take 2,000-4,000 Units by mouth See admin instructions. Take 4,000 units by mouth once a day on Mon/Wed/Fri and 2,000 units on Sun/Tues/Thurs/Sat      Follow-up Information    Josue Hector, MD Follow up.   Specialty:  Cardiology Why:  Our office will call you to schedule a telehealth visit within the next 2 weeks. Contact information: [address] [place] [address] [phone]        Bradly Bienenstock., MD Follow up in 1 week(s).   Contact information: [address] [place] [address] [phone]          Allergies  Allergen Reactions  . Ilosone [Erythromycin] Anaphylaxis  . Keflex [Cephalexin] Anaphylaxis  . Penicillins Anaphylaxis    Has patient had a PCN reaction causing immediate rash, facial/tongue/throat swelling, SOB or lightheadedness with hypotension: YES Has patient had a PCN reaction causing severe rash involving mucus membranes or skin necrosis: NO Has patient had a PCN reaction that required hospitalizationNO Has patient had a PCN reaction occurring within the last 10 years: NO If all of the above answers are "NO", then may proceed with Cephalosporin use.  . Iron Other (See Comments)    Pt reports condition limiting Iron intake.  . Metformin And Related Other (See Comments)    Pt prefers to not take this medication  . Morphine And Related Other (See Comments)    Pt reports hallucinations    Consultations: Cardiology  Procedures/Studies: Dg Chest 2 View  Result Date: 10/27/2018 CLINICAL DATA:  53 year old female with a history leukocytosis EXAM: CHEST - 2 VIEW COMPARISON:  [facility] [place] 1,020, CT chest 08/09/2018 FINDINGS: Cardiomediastinal silhouette unchanged in size and contour. No evidence of pneumothorax. Interval development reticulonodular opacities of the bilateral lungs, with opacity at the posterior lung base associated with pleural thickening and meniscus on the lateral view. Similar  appearance vascular stent left subclavian artery. IMPRESSION: Pattern of reticulonodular opacity suggests bronchopneumonia, with associated small parapneumonic effusions. Electronically Signed   By: Corrie Mckusick D.O.   On: 10/27/2018 08:30   Dg Chest Port 1 View  Result Date: 10/27/2018 CLINICAL DATA:  Hypoxia. EXAM: PORTABLE CHEST 1 VIEW COMPARISON:  Frontal and lateral views earlier this day. FINDINGS: Borderline cardiomegaly with unchanged mediastinal contours. Again seen vascular stents in the region of the left subclavian. Progressive septal thickening and Kerley B-lines consistent with pulmonary edema. Underlying reticulonodular opacities are again seen, primarily in the mid lung zones. Left pleural effusion, possibly increased. No pneumothorax. IMPRESSION:  1. Development of pulmonary edema since earlier this day. Left pleural effusion with possible progression. 2. Background reticulonodular opacities, possible underlying infection. Electronically Signed   By: Keith Rake M.D.   On: 10/27/2018 21:26    Subjective: Patient denies chest pain.  She is still experiencing nausea.  She was able to eat breakfast yesterday, she did not need that much lunch. Phenergan helps with nausea.  No further vomiting.  Discharge Exam: Vitals:   10/31/18 0754 10/31/18 0844  BP: (!) 100/57 119/64  Pulse: 73   Resp: 20   Temp: 98.1 F (36.7 C)   SpO2: 96%      General: Pt is alert, awake, not in acute distress Cardiovascular: RRR, S1/S2 +, no rubs, no gallops Respiratory: CTA bilaterally, no wheezing, no rhonchi Abdominal: Soft, NT, ND, bowel sounds + Extremities: no edema, no cyanosis    The results of significant diagnostics from this hospitalization (including imaging, microbiology, ancillary and laboratory) are listed below for reference.     Microbiology: No results found for this or any previous visit (from the past 240 hour(s)).   Labs: BNP (last 3 results) No results for  input(s): BNP in the last 8760 hours. Basic Metabolic Panel: Recent Labs  Lab 10/26/18 0200  10/27/18 0253 10/28/18 0321 10/29/18 0754 10/30/18 0249 10/31/18 0752  NA 138   < > 138 138 137 135 138  K 2.7*   < > 4.0 3.2* 3.7 3.3* 3.2*  CL 98   < > 110 104 103 104 102  CO2 22   < > 21* 21* _0 GLUCOSE 220*   < > 149* 241* 120* 242* 166*  BUN <5*   < > 6 5* _1 CREATININE 0.71   < > 0.68 0.67 0.68 0.87 0.94  CALCIUM 8.7*   < > 7.9* 8.1* 9.0 8.6* 9.2  MG 2.2  --   --   --   --   --   --    < > = values in this interval not displayed.   Liver Function Tests: Recent Labs  Lab 10/26/18 0200 10/29/18 0852  AST 48* 43*  ALT 31 27  ALKPHOS 69 70  BILITOT 1.1 1.4*  PROT 7.1 6.4*  ALBUMIN 3.7 3.2*   Recent Labs  Lab 10/29/18 0852  LIPASE 21   No results for input(s): AMMONIA in the last 168 hours. CBC: Recent Labs  Lab 10/26/18 0026 10/27/18 0253 10/28/18 0321 10/29/18 0754 10/30/18 0249  WBC 14.7* 23.7* 13.7* 15.0* 12.5*  NEUTROABS 13.5*  --  10.2*  --   --   HGB 14.2 13.9 12.9 13.5 13.2  HCT 46.6* 43.5 42.5 43.6 43.4  MCV 64.5* 63.0* 64.4* 63.9* 64.2*  PLT 317 253 306 408* 357   Cardiac Enzymes: Recent Labs  Lab 10/26/18 0200 10/26/18 0622 10/26/18 1127 10/26/18 1746  TROPONINI 0.67* 1.00* 0.98* 0.90*   BNP: Invalid input(s): POCBNP CBG: Recent Labs  Lab 10/30/18 0804 10/30/18 1223 10/30/18 1701 10/30/18 2139 10/31/18 0752  GLUCAP 84 233* 316* 275* 155*   D-Dimer No results for input(s): DDIMER in the last 72 hours. Hgb A1c No results for input(s): HGBA1C in the last 72 hours. Lipid Profile No results for input(s): CHOL, HDL, LDLCALC, TRIG, CHOLHDL, LDLDIRECT in the last 72 hours. Thyroid function studies No results for input(s): TSH, T4TOTAL, T3FREE, THYROIDAB in the last 72 hours.  Invalid input(s): FREET3 Anemia work up No results for input(s): VITAMINB12, FOLATE, FERRITIN, TIBC,  IRON, RETICCTPCT in the last 72  hours. Urinalysis    Component Value Date/Time   COLORURINE YELLOW 10/27/2018 0845   APPEARANCEUR CLEAR 10/27/2018 0845   LABSPEC 1.016 10/27/2018 0845   PHURINE 7.0 10/27/2018 0845   GLUCOSEU NEGATIVE 10/27/2018 0845   HGBUR NEGATIVE 10/27/2018 0845   BILIRUBINUR NEGATIVE 10/27/2018 0845   KETONESUR 5 (A) 10/27/2018 0845   PROTEINUR NEGATIVE 10/27/2018 0845   UROBILINOGEN 0.2 08/03/2013 2352   NITRITE NEGATIVE 10/27/2018 0845   LEUKOCYTESUR NEGATIVE 10/27/2018 0845   Sepsis Labs Invalid input(s): PROCALCITONIN,  WBC,  LACTICIDVEN Microbiology No results found for this or any previous visit (from the past 240 hour(s)).   Time coordinating discharge: 40 minutes  SIGNED:   Elmarie Shiley, MD  [facility]

## 2018-10-31 NOTE — Progress Notes (Signed)
Discharge instructions (including medications) discussed with and copy provided to patient/caregiver 

## 2018-11-01 ENCOUNTER — Telehealth (HOSPITAL_COMMUNITY): Payer: Self-pay

## 2018-11-01 NOTE — Telephone Encounter (Signed)
Pt insurance is active and benefits verified through [facility]. Co-pay $30.00, DED $300.00/$300.00 met, out of pocket $2,000.00/$2,000.00 met, co-insurance 0%. No pre-authorization required. Passport, 11/01/2018 @ 2:24PM, REF# [id number]  Will contact patient to see if she is interested in the Cardiac Rehab Program. If interested, patient will need to complete follow up appt. Once completed, patient will be contacted for scheduling upon review by the RN Navigator.

## 2018-11-01 NOTE — Telephone Encounter (Signed)
Called patient to see if she is interested in the Cardiac Rehab Program. Patient expressed interest. Explained scheduling process and went over insurance, patient verbalized understanding. Adv pt our department is closed until mid April due to COVID-19 and we will contact her once we resume scheduling.

## 2018-11-02 ENCOUNTER — Telehealth: Payer: Self-pay | Admitting: Cardiovascular Disease

## 2018-11-02 NOTE — Telephone Encounter (Signed)
Pt called stating that she was unable to get her medication of Brilinta because it was costing to much. I asked the pt if she was given a 30 day free coupon card and a co-pay card. Pt stated that she was not given one. I informed pt that I could leave them for her to pick up or I could mail them out to her. Pt stated that she would like for me to mail them to her. I put the cards in the mail to the pt. I advised the pt that if she has any other problems, questions or concerns to call the office. Pt verbalized understanding.

## 2018-11-07 ENCOUNTER — Other Ambulatory Visit: Payer: Self-pay

## 2018-11-07 ENCOUNTER — Encounter: Payer: 59 | Admitting: Internal Medicine

## 2018-11-07 NOTE — Progress Notes (Deleted)
Virtual Visit via Video Note  I connected with Morgan [name] on 11/07/18 at  1:00 PM EDT by a video enabled telemedicine application and verified that I am speaking with the correct person using two identifiers.   I discussed the limitations of evaluation and management by telemedicine and the availability of in person appointments. The patient expressed understanding and agreed to proceed.   -Location of the patient : -Location of the provider : Office  -The names of all persons participating in the telemedicine service : CMA Lolita Rieger       PATIENT IDENTIFIER: Morgan [name] is a 53 y.o. female with a past medical history of diabetes insulin-dependent, hypothyroidism, bipolar disorder, depression and CAD (S/P stent placement 10/2018). The patient has followed with Endocrinology clinic since 2/24 for consultative assistance with management of her diabetes.  DIABETIC HISTORY:  Morgan [name] was diagnosed with T2DM many years ago, she is intolerant to metformin due to diarrhea. She has been on Actos and SU with no reported intolerance. She has been on an insulin pump since 2017. Her hemoglobin A1c has ranged from 9.8% in 06/2018, peaking at 13.2% in 2017.    On her initial visit to our clinic her A1c was  10.4% , she was on Ozempic and Medtronic pump that she was using for just basal, her settings at some point included 8 u/hr.     Works 11 AM to 10 pm for [employer]   SUBJECTIVE:   During the last visit (09/25/2018): We increased Ozempic to 1 mg weekly and decreased her basal rate .  Today (11/07/2018): [name] is here for  a 6 week virtual visit follow up on diabetes management. She checks her blood sugars *** times daily, preprandial to breakfast and ***. The patient has *** had hypoglycemic episodes since the last clinic visit, which typically occur *** x / - most often occuring ***. The patient is *** symptomatic with these episodes, with symptoms of {symptoms;  hypoglycemia:9084048}. Otherwise, the patient has not required any recent emergency interventions for hypoglycemia but  has had recent hospitalizations requiring another cardiac stent placement.     This patient with type 2 diabetes is treated with Humalog (insulin pump). During the visit the pump basal and bolus doses were reviewed including carb/insulin rations and supplemental doses. The clinical list was updated. The glucose meter download was reviewed in detail to determine if the current pump settings are providing the best glycemic control without excessive hypoglycemia.  Pump and meter download:    Pump   MEDTRONIC   Settings   Insulin type   NOVOLOG    Basal rate       0000-0730  2.0 u/h    0730-0900 2.0 u/h    0900-0000  2.0 u/h       I:C ratio       0000-0000  3.0          Sensitivity       0000  20      Goal       0800-0000 120   0000 -0800 130        Type & Model of Pump: *** Insulin Type: Currently using ***.   PUMP STATISTICS: Average BG: *** +/- *** BG Readings: *** (*** / day) Average Daily Carbs (g): *** +/- *** Average Total Daily Insulin: *** +/- *** Average Daily Basal: *** (*** %) Average Daily Bolus: *** (*** %) Total Daily Dose in Units / KG: ***  ROS: As per HPI and as detailed below: ROS    HOME DIABETES REGIMEN:  Ozempic 1 mg weekly  Humalog     METER DOWNLOAD SUMMARY: Date range evaluated: *** Fingerstick Blood Glucose Tests = *** Average Number Tests/Day = *** Overall Mean FS Glucose = *** Standard Deviation = ***  BG Ranges: Low = *** High = ***   Hypoglycemic Events/30 Days: BG < 50 = *** Episodes of symptomatic severe hypoglycemia = ***    HISTORY:  Past Medical History:  Past Medical History:  Diagnosis Date  . Anxiety   . Asthma   . Bipolar depression ([place])   . Chronic bronchitis ([place])   . Chronic esophagogastric ulcer   . Chronic stomach ulcer   . Fibroid   . GERD (gastroesophageal reflux  disease)   . Heart murmur   . Hepatic steatosis   . History of blood transfusion 2010   "related to subclavian stent"  . History of hiatal hernia   . Hyperlipidemia   . Hypertension   . Hypothyroid   . Migraine    "cerebral migraines; 1-2/month" (06/29/2018)  . 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 Stroke [facility])    "I've had 2; most recent one was ~ 2015 or before; affected balance" (06/29/2018)  . Thalassemia minor   . Type II diabetes mellitus ([place])   . Vitamin D deficiency     Past Surgical History:  Past Surgical History:  Procedure Laterality Date  . ABDOMINAL HYSTERECTOMY  2015   "still have 1 ovary"  . BIOPSY  07/25/2018   Procedure: BIOPSY;  Surgeon: Thornton Park, MD;  Location: [facility] ENDOSCOPY;  Service: Gastroenterology;;  . [place]; 1996  . CORONARY STENT INTERVENTION N/A 10/27/2018   Procedure: CORONARY STENT INTERVENTION;  Surgeon: Sherren Mocha, MD;  Location: [place] CV LAB;  Service: Cardiovascular;  Laterality: N/A;  . ESOPHAGOGASTRODUODENOSCOPY (EGD) WITH PROPOFOL N/A 07/25/2018   Procedure: ESOPHAGOGASTRODUODENOSCOPY (EGD) WITH PROPOFOL;  Surgeon: Thornton Park, MD;  Location: [place];  Service: Gastroenterology;  Laterality: N/A;  . KNEE RECONSTRUCTION Left 1980  . LAPAROSCOPIC CHOLECYSTECTOMY    . LEFT HEART CATH AND CORONARY ANGIOGRAPHY N/A 10/27/2018   Procedure: LEFT HEART CATH AND CORONARY ANGIOGRAPHY;  Surgeon: Sherren Mocha, MD;  Location: [place] CV LAB;  Service: Cardiovascular;  Laterality: N/A;  . SUBCLAVIAN STENT PLACEMENT Left 2010  . TONSILLECTOMY  1970     Social History:  reports that she quit smoking about 4 years ago. Her smoking use included cigarettes. She has a 72.00 pack-year smoking history. She has never used smokeless tobacco. She reports previous alcohol use. She reports previous drug use. Family History:  Family History  Problem Relation Age of Onset  . Diabetes Mother   . Hypertension  Mother   . Hyperlipidemia Mother   . CAD Mother   . CAD Father   . Heart failure Father   . Hypertension Sister   . Hyperlipidemia Sister   . Hypertension Brother   . Hyperlipidemia Brother       HOME MEDICATIONS: Allergies as of 11/07/2018      Reactions   Ilosone [erythromycin] Anaphylaxis   Keflex [cephalexin] Anaphylaxis   Penicillins Anaphylaxis   Has patient had a PCN reaction causing immediate rash, facial/tongue/throat swelling, SOB or lightheadedness with hypotension: YES Has patient had a PCN reaction causing severe rash involving mucus membranes or skin necrosis: NO Has patient had a PCN reaction that required hospitalizationNO Has patient had a PCN  reaction occurring within the last 10 years: NO If all of the above answers are "NO", then may proceed with Cephalosporin use.   Iron Other (See Comments)   Pt reports condition limiting Iron intake.   Metformin And Related Other (See Comments)   Pt prefers to not take this medication   Morphine And Related Other (See Comments)   Pt reports hallucinations      Medication List       Accurate as of November 07, 2018  8:18 AM. Always use your most recent med list.        aspirin 81 MG tablet Take 1 tablet (81 mg total) by mouth daily.   atorvastatin 80 MG tablet Commonly known as:  LIPITOR Take 1 tablet (80 mg total) by mouth daily.   butalbital-acetaminophen-caffeine 50-325-40 MG tablet Commonly known as:  FIORICET, ESGIC Take 1-2 tablets by mouth every 6 (six) hours as needed for headache or migraine.   Contour Next One Kit 1 kit by Does not apply route as directed.   doxepin 25 MG capsule Commonly known as:  SINEQUAN Take 50 mg by mouth at bedtime.   doxycycline 100 MG tablet Commonly known as:  VIBRA-TABS Take 1 tablet (100 mg total) by mouth every 12 (twelve) hours.   EPINEPHrine 0.3 mg/0.3 mL [place] injection Commonly known as:  EPI-PEN Inject 0.3 mg into the muscle once as needed (for an anaphylactic  reaction).   FLUoxetine 20 MG tablet Commonly known as:  PROZAC Take 20 mg by mouth every morning.   furosemide 20 MG tablet Commonly known as:  LASIX Take 1 tablet (20 mg total) by mouth daily.   Glucagon Emergency 1 MG injection Generic drug:  glucagon Inject 1 mg into the vein once as needed (FOR ONSET OF HYPOGLYCEMIA).   insulin pump Soln Inject into the skin continuous. Humalog   lamoTRIgine 25 MG tablet Commonly known as:  LAMICTAL Take 50 mg by mouth daily.   Latuda 60 MG Tabs Generic drug:  Lurasidone HCl Take 60 mg by mouth daily.   levothyroxine 150 MCG tablet Commonly known as:  SYNTHROID, LEVOTHROID Take 150 mcg by mouth daily.   losartan 50 MG tablet Commonly known as:  COZAAR Take 1 tablet (50 mg total) by mouth daily.   metoprolol tartrate 50 MG tablet Commonly known as:  LOPRESSOR Take 1 tablet (50 mg total) by mouth 2 (two) times daily.   multivitamin with minerals Tabs tablet Take 1 tablet by mouth daily.   nitroGLYCERIN 0.4 MG SL tablet Commonly known as:  NITROSTAT Place 1 tablet (0.4 mg total) under the tongue every 5 (five) minutes x 3 doses as needed for chest pain.   Ozempic (0.25 or 0.5 MG/DOSE) 2 MG/1.5ML Sopn Generic drug:  Semaglutide(0.25 or 0.5MG/DOS) Inject 0.5 mg into the skin once a week. On Monday   pantoprazole 40 MG tablet Commonly known as:  PROTONIX Take 1 tablet (40 mg total) by mouth 2 (two) times daily before a meal. X 8 weeks   Potassium Chloride ER 20 MEQ Tbcr Take 40 mEq by mouth daily.   promethazine 12.5 MG tablet Commonly known as:  PHENERGAN Take 1 tablet (12.5 mg total) by mouth every 6 (six) hours as needed for nausea or vomiting.   ticagrelor 90 MG Tabs tablet Commonly known as:  BRILINTA Take 1 tablet (90 mg total) by mouth 2 (two) times daily.   traMADol 50 MG tablet Commonly known as:  ULTRAM Take 0.5 tablets (25 mg total)  by mouth every 12 (twelve) hours as needed for moderate pain.   Vitamin D3  50 MCG (2000 UT) Tabs Take 2,000-4,000 Units by mouth See admin instructions. Take 4,000 units by mouth once a day on Mon/Wed/Fri and 2,000 units on Sun/Tues/Thurs/Sat         DATA REVIEWED:  Lab Results  Component Value Date   HGBA1C 10.4 (H) 07/26/2018   HGBA1C 9.8 (H) 06/27/2018   HGBA1C 13.2 (H) 05/15/2016   Lab Results  Component Value Date   CREATININE 0.94 10/31/2018   No results found for: [facility]   Lab Results  Component Value Date   CHOL 190 09/25/2018   HDL 37.50 (L) 09/25/2018   LDLDIRECT 144.0 09/25/2018   TRIG 245.0 (H) 09/25/2018   CHOLHDL 5 09/25/2018         ASSESSMENT / PLAN / RECOMMENDATIONS:   1) 1) Type 2 Diabetes Mellitus, Poorly controlled, With microvascular complications - Most recent A1c of 10.4 %. Goal A1c <7.0 %.   Plan: MEDICATIONS:  ***  EDUCATION / INSTRUCTIONS:  BG monitoring instructions: Patient is instructed to check her blood sugars *** times a day, ***.  Call [facility] if: BG persistently < 70 or > 300. . I reviewed the Rule of 15 for the treatment of hypoglycemia in detail with the patient. Literature supplied.   2) Diabetic complications:   Eye: Does *** have known diabetic retinopathy.   Neuro/ Feet: Does *** have known diabetic peripheral neuropathy .   Renal: Patient does *** have known baseline CKD. She   is *** on an ACEI/ARB at present.     I discussed the assessment and treatment plan with the patient. The patient was provided an opportunity to ask questions and all were answered. The patient agreed with the plan and demonstrated an understanding of the instructions.   The patient was advised to call back or seek an in-person evaluation if the symptoms worsen or if the condition fails to improve as anticipated.  I provided *** minutes of non-face-to-face time during this encounter.   F/U in ***    Signed electronically by: Mack Guise, MD  [facility] Endocrinology   [facility] [address] [place], [place] [address] Phone: [phone] FAX: [phone]   CC: [address] [facility] [place] [address] Phone: [phone]  Fax: [phone]  Return to Endocrinology clinic as below: Future Appointments  Date Time Provider [place]  11/07/2018  1:00 PM Jesyca Weisenburger, Melanie Crazier, MD [facility] None  11/16/2018  3:30 PM Josue Hector, MD [facility]

## 2018-11-08 ENCOUNTER — Telehealth: Payer: Self-pay | Admitting: Internal Medicine

## 2018-11-08 ENCOUNTER — Telehealth: Payer: Self-pay | Admitting: Nutrition

## 2018-11-08 NOTE — Patient Instructions (Signed)
Read over information given and call if questions.

## 2018-11-08 NOTE — Telephone Encounter (Signed)
Tried to call Patient to see if she made a decision on which sensor she would like, but was not able to leave a message on her home or her cell--she was not accepting calls at this time.

## 2018-11-08 NOTE — Telephone Encounter (Signed)
Pt has only been seen once 09/25/18, did not answer for telehealth 11/07/18 will send what we have.

## 2018-11-08 NOTE — Progress Notes (Signed)
Patient is here to review training on insulin pump and learn about cgm  Patient is wearing a Medtronic 630G with no sensor.  Download shows she is testing 1-2X/day with some days not testing at all.  Not testing 8 of the 14 days of the download Discussed with her the need for testing--to bring down high readings, both before a meal and at bedtime.   We also discussed the need to bolus whenever she eats, even if she can not test her blood sugar readings.  She reported good understanding of this.  Says she is testing more.  Stressed the importance of the 2 above concepts, and she was told that if she did this, her blood sugars would come way down.  She was shown the Medtronic sensors, as well as the [employer].  She was given  Brochures of each model.  She will look them over and let me know which one she would like.  Because the Medtronic sensor requires 3 blood sugar readings per day, I do not believe she will want that one.

## 2018-11-08 NOTE — Telephone Encounter (Signed)
Medtronic is requesting the patients last 2 most recent office notes faxed over to them   [place]- [phone]

## 2018-11-14 ENCOUNTER — Telehealth: Payer: Self-pay

## 2018-11-14 NOTE — Telephone Encounter (Signed)
Tired to call patient no answer on both numbers listed. One line sounded like a fax machine. There was no voicemail to leave message on the other.

## 2018-11-14 NOTE — Progress Notes (Signed)
Virtual Visit via Video Note   This visit type was conducted due to national recommendations for restrictions regarding the COVID-19 Pandemic (e.g. social distancing) in an effort to limit this patient's exposure and mitigate transmission in our community.  Due to her co-morbid illnesses, this patient is at least at moderate risk for complications without adequate follow up.  This format is felt to be most appropriate for this patient at this time.  All issues noted in this document were discussed and addressed.  A limited physical exam was performed with this format.  Please refer to the patient's chart for her consent to telehealth for [facility].   Evaluation Performed:  Follow-up visit  Date:  11/16/2018   ID:  Morgan [name], DOB 1966/01/07, MRN [id number]  Patient Location: Home  Provider Location: Office  PCP:  Bradly Bienenstock., MD  Cardiologist:  Jenkins Rouge, MD   Electrophysiologist:  None   Chief Complaint:  CAD/DM/Dyspnea  History of Present Illness:    Morgan [name] is a 53 y.o. female who presents via audio/video conferencing for a telehealth visit today.    TOC d/c from hospital 10/31/18 Presented with intractable nausea and vomiting and DKA Noted to have elevated troponin max 1.0 Cath reviewed Dr Burt Knack 10/27/18 with severe LAD/D1 stenosis Rx with DES to both arteries no significant RCA/Circumflex disease LvEDP mildly elevated and had some post cath CHF Rx with Lasix. Echo 10/26/18 with EF 45-50% with RWMA in septal and apex consistent with her CAD.  D/c with lasix 20 mg , losartan 50 mg, lopressor 50 bid and Brilinta with ASA Had issues affording/ getting Brilinta  Seems to have high HR and BP [employer] not covering [place] with exertional dyspnea, fatigue and nausea Her Bipolar disease is fairly new diagnosis end of last year. Had been Rx years of depression but things changed in December 2019 Now seeing psychologist/psychiatrist weeksly  Has not gone back to work at [employer] yet   The patient does not have symptoms concerning for COVID-19 infection (fever, chills, cough, or new shortness of breath).    Past Medical History:  Diagnosis Date  . Anxiety   . Asthma   . Bipolar depression ([place])   . Chronic bronchitis ([place])   . Chronic esophagogastric ulcer   . Chronic stomach ulcer   . Fibroid   . GERD (gastroesophageal reflux disease)   . Heart murmur   . Hepatic steatosis   . History of blood transfusion 2010   "related to subclavian stent"  . History of hiatal hernia   . Hyperlipidemia   . Hypertension   . Hypothyroid   . Migraine    "cerebral migraines; 1-2/month" (06/29/2018)  . 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 Stroke [facility])    "I've had 2; most recent one was ~ 2015 or before; affected balance" (06/29/2018)  . Thalassemia minor   . Type II diabetes mellitus ([place])   . Vitamin D deficiency    Past Surgical History:  Procedure Laterality Date  . ABDOMINAL HYSTERECTOMY  2015   "still have 1 ovary"  . BIOPSY  07/25/2018   Procedure: BIOPSY;  Surgeon: Thornton Park, MD;  Location: [facility] ENDOSCOPY;  Service: Gastroenterology;;  . [place]; 1996  . CORONARY STENT INTERVENTION N/A 10/27/2018   Procedure: CORONARY STENT INTERVENTION;  Surgeon: Sherren Mocha, MD;  Location: [place] CV LAB;  Service: Cardiovascular;  Laterality: N/A;  . ESOPHAGOGASTRODUODENOSCOPY (EGD) WITH PROPOFOL N/A 07/25/2018  Procedure: ESOPHAGOGASTRODUODENOSCOPY (EGD) WITH PROPOFOL;  Surgeon: Thornton Park, MD;  Location: [place];  Service: Gastroenterology;  Laterality: N/A;  . KNEE RECONSTRUCTION Left 1980  . LAPAROSCOPIC CHOLECYSTECTOMY    . LEFT HEART CATH AND CORONARY ANGIOGRAPHY N/A 10/27/2018   Procedure: LEFT HEART CATH AND CORONARY ANGIOGRAPHY;  Surgeon: Sherren Mocha, MD;  Location: [place] CV LAB;  Service: Cardiovascular;  Laterality: N/A;  . SUBCLAVIAN STENT PLACEMENT Left 2010  .  TONSILLECTOMY  1970     Current Meds  Medication Sig  . aspirin 81 MG tablet Take 1 tablet (81 mg total) by mouth daily.  [name] atorvastatin (LIPITOR) 80 MG tablet Take 1 tablet (80 mg total) by mouth daily.  . Blood Glucose Monitoring Suppl (CONTOUR NEXT ONE) KIT 1 kit by Does not apply route as directed.  . butalbital-acetaminophen-caffeine (FIORICET, ESGIC) 50-325-40 MG tablet Take 1-2 tablets by mouth every 6 (six) hours as needed for headache or migraine.  . Cholecalciferol (VITAMIN D3) 50 MCG (2000 UT) TABS Take 2,000-4,000 Units by mouth See admin instructions. Take 4,000 units by mouth once a day on Mon/Wed/Fri and 2,000 units on Sun/Tues/Thurs/Sat  . EPINEPHrine 0.3 mg/0.3 mL IJ SOAJ injection Inject 0.3 mg into the muscle once as needed (for an anaphylactic reaction).   [name] FLUoxetine (PROZAC) 20 MG tablet Take 20 mg by mouth every morning.   . furosemide (LASIX) 20 MG tablet Take 1 tablet (20 mg total) by mouth daily.  [name] GLUCAGON EMERGENCY 1 MG injection Inject 1 mg into the vein once as needed (FOR ONSET OF HYPOGLYCEMIA).   . Insulin Human (INSULIN PUMP) SOLN Inject into the skin continuous. Humalog  . lamoTRIgine (LAMICTAL) 25 MG tablet Take 50 mg by mouth daily.  [name] levothyroxine (SYNTHROID, LEVOTHROID) 150 MCG tablet Take 150 mcg by mouth daily.   [name] losartan (COZAAR) 100 MG tablet Take 1 tablet (100 mg total) by mouth daily.  . Lurasidone HCl (LATUDA) 60 MG TABS Take 60 mg by mouth daily.  . metoprolol tartrate (LOPRESSOR) 50 MG tablet Take 1 tablet (50 mg total) by mouth 2 (two) times daily.  . Multiple Vitamin (MULTIVITAMIN WITH MINERALS) TABS tablet Take 1 tablet by mouth daily.  . nitroGLYCERIN (NITROSTAT) 0.4 MG SL tablet Place 1 tablet (0.4 mg total) under the tongue every 5 (five) minutes x 3 doses as needed for chest pain.  [name] OZEMPIC, 0.25 OR 0.5 MG/DOSE, 2 MG/1.5ML SOPN Inject 0.5 mg into the skin once a week. On Monday  . pantoprazole (PROTONIX) 40 MG tablet Take 1 tablet (40  mg total) by mouth 2 (two) times daily before a meal. X 8 weeks  . Potassium Chloride ER 20 MEQ TBCR Take 40 mEq by mouth daily.  . promethazine (PHENERGAN) 12.5 MG tablet Take 1 tablet (12.5 mg total) by mouth every 6 (six) hours as needed for nausea or vomiting.  . traMADol (ULTRAM) 50 MG tablet Take 0.5 tablets (25 mg total) by mouth every 12 (twelve) hours as needed for moderate pain.  . [DISCONTINUED] losartan (COZAAR) 50 MG tablet Take 1 tablet (50 mg total) by mouth daily.  . [DISCONTINUED] ticagrelor (BRILINTA) 90 MG TABS tablet Take 1 tablet (90 mg total) by mouth 2 (two) times daily.     Allergies:   Ilosone [erythromycin]; Keflex [cephalexin]; Penicillins; Iron; Metformin and related; and Morphine and related   Social History   Tobacco Use  . Smoking status: Former Smoker    Packs/day: 2.00    Years: 36.00  Pack years: 72.00    Types: Cigarettes    Last attempt to quit: 05/14/2014    Years since quitting: 4.5  . Smokeless tobacco: Never Used  Substance Use Topics  . Alcohol use: Not Currently  . Drug use: Not Currently     Family Hx: The patient's family history includes CAD in her father and mother; Diabetes in her mother; Heart failure in her father; Hyperlipidemia in her brother, mother, and sister; Hypertension in her brother, mother, and sister.  ROS:   Please see the history of present illness.     All other systems reviewed and are negative.   Prior CV studies:   The following studies were reviewed today: Echo 10/26/18 Cath 10/27/18   Labs/Other Tests and Data Reviewed:    EKG  10/30/18 SR rate 103 normal   Recent Labs: 07/26/2018: TSH 2.978 10/26/2018: Magnesium 2.2 10/29/2018: ALT 27 10/30/2018: Hemoglobin 13.2; Platelets 357 10/31/2018: BUN 13; Creatinine, Ser 0.94; Potassium 3.2; Sodium 138   Recent Lipid Panel Lab Results  Component Value Date/Time   CHOL 190 09/25/2018 03:05 PM   TRIG 245.0 (H) 09/25/2018 03:05 PM   HDL 37.50 (L) 09/25/2018  03:05 PM   CHOLHDL 5 09/25/2018 03:05 PM   LDLDIRECT 144.0 09/25/2018 03:05 PM    Wt Readings from Last 3 Encounters:  11/16/18 97.5 kg  10/31/18 100.3 kg  09/25/18 118.1 kg     Objective:    Vital Signs:  BP (!) 174/110   Pulse (!) 111   Ht 5' 2.5" (1.588 m)   Wt 97.5 kg   LMP 08/03/2013   BMI 38.70 kg/m    Well nourished, well developed female in no acute distress. Skin warm and dry No tachypnea No JVP elevation  No edema  ASSESSMENT & PLAN:    1. CAD:  DES to mid LAD/D1 10/27/18 ? Atypical presentation of SEMI with nausea and vomiting in poorly controlled diabetic. Troponin peak only 1.0 Continue DAT  Will change to plavix with loading dose since insurance does not cover Brilinta  statin and beta blocker No significant disease in RCA/Circumflex Refer to cardiac rehab when opens up again  2. CHF:  EF 45-50% with RWMA in LAD territory on lasix Increase losartan for BP  3. DM:  Discussed low carb diet.  Target hemoglobin A1c is 6.5 or less.  Continue current medications. 4. Thyroid:  On replacement TSH normal 5. Bipolar:  On Sinequan and Prozac f/u with Psych 6. HLD:  On statin LDL 144 in hospital f/u labs in 3 months normal LFTls  COVID-19 Education: The signs and symptoms of COVID-19 were discussed with the patient and how to seek care for testing (follow up with PCP or arrange E-visit).  The importance of social distancing was discussed today.  Time:   Today, I have spent 30 minutes with the patient with telehealth technology discussing the above problems.     Medication Adjustments/Labs and Tests Ordered: Current medicines are reviewed at length with the patient today.  Concerns regarding medicines are outlined above.   Tests Ordered:  Echo 3 months for ischemic DCM  Medication Changes: Meds ordered this encounter  Medications  . clopidogrel (PLAVIX) 75 MG tablet    Sig: Take 300 mg (4 tablets)  by mouth the first day, then take 75 mg by mouth daily.     Dispense:  94 tablet    Refill:  3  . losartan (COZAAR) 100 MG tablet    Sig: Take 1 tablet (100  mg total) by mouth daily.    Dispense:  90 tablet    Refill:  3    Disposition:  Follow up in 3 monhths with echo for ischemic DCM  Signed, Jenkins Rouge, MD  11/16/2018 2:57 PM    [facility]

## 2018-11-14 NOTE — Telephone Encounter (Signed)
[employer] has called in regards to the CMN they received stating Type 1 & 2. They would just like a clarification which DX the patient is and that document refaxed. Please Advise, Thanks  Fax # [phone]

## 2018-11-16 ENCOUNTER — Encounter: Payer: Self-pay | Admitting: Cardiovascular Disease

## 2018-11-16 ENCOUNTER — Telehealth (HOSPITAL_COMMUNITY): Payer: Self-pay | Admitting: *Deleted

## 2018-11-16 ENCOUNTER — Other Ambulatory Visit: Payer: Self-pay

## 2018-11-16 ENCOUNTER — Telehealth (INDEPENDENT_AMBULATORY_CARE_PROVIDER_SITE_OTHER): Payer: 59 | Admitting: Cardiovascular Disease

## 2018-11-16 VITALS — BP 174/110 | HR 111 | Ht 62.5 in | Wt 215.0 lb

## 2018-11-16 DIAGNOSIS — I2510 Atherosclerotic heart disease of native coronary artery without angina pectoris: Secondary | ICD-10-CM

## 2018-11-16 MED ORDER — CLOPIDOGREL BISULFATE 75 MG PO TABS: ORAL_TABLET | ORAL | 3 refills | Status: DC

## 2018-11-16 MED ORDER — LOSARTAN POTASSIUM 100 MG PO TABS: 100.0000 mg | ORAL_TABLET | Freq: Every day | ORAL | 3 refills | Status: DC

## 2018-11-16 NOTE — Telephone Encounter (Signed)
Paperwork was corrected and re-faxed

## 2018-11-16 NOTE — Patient Instructions (Addendum)
Medication Instructions:  Your physician has recommended you make the following change in your medication:  1-STOP Brilinta 2-START Plavix 75 mg by mouth daily, take 300 mg (4 tablets) by mouth the first dose. 3-INCREASE Losartan 100 mg by mouth daily  If you need a refill on your cardiac medications before your next appointment, please call your pharmacy.   Lab work:  If you have labs (blood work) drawn today and your tests are completely normal, you will receive your results only by: [name] MyChart Message (if you have MyChart) OR . A paper copy in the mail If you have any lab test that is abnormal or we need to change your treatment, we will call you to review the results.  Testing/Procedures: None ordered today.  Follow-Up: At [facility], you and your health needs are our priority.  As part of our continuing mission to provide you with exceptional heart care, we have created designated Provider Care Teams.  These Care Teams include your primary Cardiologist (physician) and Advanced Practice Providers (APPs -  Physician Assistants and Nurse Practitioners) who all work together to provide you with the care you need, when you need it. You will need a follow up appointment in 2 weeks for virtual visit.  You may see Jenkins Rouge, MD or one of the following Advanced Practice Providers on your designated Care Team:   Truitt Merle, NP Cecilie Kicks, NP . Kathyrn Drown, NP  You have been referred to Cardiac Rehab, they will call you with an appointment when COVID19 precautions are done.

## 2018-11-16 NOTE — Telephone Encounter (Signed)
Have been waiting on fax to come thru, still have not received one as of yet.

## 2018-11-16 NOTE — Telephone Encounter (Signed)
YOUR CARDIOLOGY TEAM HAS ARRANGED FOR AN E-VISIT FOR YOUR APPOINTMENT - PLEASE REVIEW IMPORTANT INFORMATION BELOW SEVERAL DAYS PRIOR TO YOUR APPOINTMENT  Due to the recent COVID-19 pandemic, we are transitioning in-person office visits to tele-medicine visits in an effort to decrease unnecessary exposure to our patients, their families, and staff. Medicare and most insurances are covering these visits without a copay needed. We also encourage you to sign up for MyChart if you have not already done so. You will need a smartphone if possible. For patients that do not have this, we can still complete the visit using a regular telephone but do prefer a smartphone to enable video when possible. You may have a family member that lives with you that can help. If possible, we also ask that you have a blood pressure cuff and scale at home to measure your blood pressure, heart rate and weight prior to your scheduled appointment. Patients with clinical needs that need an in-person evaluation and testing will still be able to come to the office if absolutely necessary. If you have any questions, feel free to call our office.     YOUR PROVIDER WILL BE USING THE FOLLOWING PLATFORM TO COMPLETE YOUR VISIT: Staff: Please delete this text and fill in WebEx/MyChart/Doximity/Doxy.Me  . IF USING WEBEX - How to Download the WebEx App to Your SmartPhone  - If Apple device, go to [employer] and type in WebEx in the search bar. [employer], the blue/green circle. If Android, go to [employer] and type in [employer] in the search bar. The app is free but as with any other app download, your phone may require you to verify saved payment information or Apple/Android password.  - You do NOT have to create an account. - On the day of the visit, our staff will walk you through joining the meeting with the meeting number/password.  Deloris Ping USING MYCHART - How to Download the MyChart App to Your SmartPhone   - If  Apple, go to [employer] and type in MyChart in the search bar and download the app. If Android, ask patient to go to [employer] and type in [place] in the search bar and download the app. The app is free but as with any other app downloads, your phone may require you to verify saved payment information or Apple/Android password.  - You will need to then log into the app with your MyChart username and password, and select [facility] as your healthcare provider to link the account. When it is time for your visit, go to the MyChart app, find appointments, and click Begin Video Visit. Be sure to Select Allow for your device to access the Microphone and Camera for your visit. You will then be connected, and your provider will be with you shortly.  **If you have any issues connecting or need assistance, please contact MyChart service desk [phone])**  **If using a computer, in order to ensure the best quality for your visit, you will need to use either of the following Internet Browsers: [employer], or [employer]**  . IF USING DOXIMITY or DOXY.ME - The staff will give you instructions on receiving your link to join the meeting the day of your visit.      2-3 DAYS BEFORE YOUR APPOINTMENT  You will receive a telephone call from one of our [place] team members - your caller ID may say "Unknown caller." If this is a video visit, we  will walk you through how to set up your device to be able to complete the visit. We will remind you check your blood pressure, heart rate and weight prior to your scheduled appointment. If you have an Apple Watch or Kardia, please upload any pertinent ECG strips the day before or morning of your appointment to MyChart. Our staff will also make sure you have reviewed the consent and agree to move forward with your scheduled tele-health visit.     THE DAY OF YOUR APPOINTMENT  Approximately 15 minutes prior to your scheduled appointment, you will  receive a telephone call from one of [facility] team - your caller ID may say "Unknown caller."  Our staff will confirm medications, vital signs for the day and any symptoms you may be experiencing. Please have this information available prior to the time of visit start. It may also be helpful for you to have a pad of paper and pen handy for any instructions given during your visit. They will also walk you through joining the smartphone meeting if this is a video visit.    CONSENT FOR TELE-HEALTH VISIT - PLEASE REVIEW  I hereby voluntarily request, consent and authorize [facility] and its employed or contracted physicians, physician assistants, nurse practitioners or other licensed health care professionals (the Practitioner), to provide me with telemedicine health care services (the "Services") as deemed necessary by the treating Practitioner. I acknowledge and consent to receive the Services by the Practitioner via telemedicine. I understand that the telemedicine visit will involve communicating with the Practitioner through live audiovisual communication technology and the disclosure of certain medical information by electronic transmission. I acknowledge that I have been given the opportunity to request an in-person assessment or other available alternative prior to the telemedicine visit and am voluntarily participating in the telemedicine visit.  I understand that I have the right to withhold or withdraw my consent to the use of telemedicine in the course of my care at any time, without affecting my right to future care or treatment, and that the Practitioner or I may terminate the telemedicine visit at any time. I understand that I have the right to inspect all information obtained and/or recorded in the course of the telemedicine visit and may receive copies of available information for a reasonable fee.  I understand that some of the potential risks of receiving the Services via telemedicine  include:  . Delay or interruption in medical evaluation due to technological equipment failure or disruption; . Information transmitted may not be sufficient (e.g. poor resolution of images) to allow for appropriate medical decision making by the Practitioner; and/or  . In rare instances, security protocols could fail, causing a breach of personal health information.  Furthermore, I acknowledge that it is my responsibility to provide information about my medical history, conditions and care that is complete and accurate to the best of my ability. I acknowledge that Practitioner's advice, recommendations, and/or decision may be based on factors not within their control, such as incomplete or inaccurate data provided by me or distortions of diagnostic images or specimens that may result from electronic transmissions. I understand that the practice of medicine is not an exact science and that Practitioner makes no warranties or guarantees regarding treatment outcomes. I acknowledge that I will receive a copy of this consent concurrently upon execution via email to the email address I last provided but may also request a printed copy by calling the office of [facility].    I understand   that my insurance will be billed for this visit.   I have read or had this consent read to me. . I understand the contents of this consent, which adequately explains the benefits and risks of the Services being provided via telemedicine.  . I have been provided ample opportunity to ask questions regarding this consent and the Services and have had my questions answered to my satisfaction. . I give my informed consent for the services to be provided through the use of telemedicine in my medical care  By participating in this telemedicine visit I agree to the above.

## 2018-11-16 NOTE — Addendum Note (Signed)
Addended by: Aris Georgia, Thanya Cegielski L on: 11/16/2018 03:13 PM   Modules accepted: Orders

## 2018-11-16 NOTE — Telephone Encounter (Signed)
New Message    Patient has smart phone and computer but needs help setting up for appointment today at 3:30.

## 2018-11-16 NOTE — Telephone Encounter (Signed)
Pt completed follow up with Dr. Johnsie Cancel on 11/16/18.  Pt is coming along slowly with progress.  Pt with elevated bp (auto cuff used from home) 174/110.  Dr. Johnsie Cancel doubled losartan to 100mg .  Pt took extra 50mg  today and will get the 100mg  tomorrow.  Pt walks in driveway, complains of dizziness.  Asked about blood glucoses. Continues to run higher than acceptable limits.  Pt to see endocrinologist on 4/17.  Will need significant improvement of blood glucose in order to participate in CR . Pt last A1C 12.?. Will send pt informational handouts regarding exercise, nutrition and diabetes management. Pt advised Cardiac Rehab continued closure due to adherence of national recommendation in group settings [place] Armed [employer] operational officer, BSN Cardiac and Pulmonary Rehab Nurse Navigator

## 2018-11-17 ENCOUNTER — Other Ambulatory Visit: Payer: Self-pay

## 2018-11-17 ENCOUNTER — Encounter: Payer: Self-pay | Admitting: Internal Medicine

## 2018-11-17 ENCOUNTER — Ambulatory Visit (INDEPENDENT_AMBULATORY_CARE_PROVIDER_SITE_OTHER): Payer: 59 | Admitting: Internal Medicine

## 2018-11-17 ENCOUNTER — Telehealth: Payer: Self-pay | Admitting: Internal Medicine

## 2018-11-17 VITALS — BP 140/56 | HR 85

## 2018-11-17 DIAGNOSIS — E1165 Type 2 diabetes mellitus with hyperglycemia: Secondary | ICD-10-CM | POA: Insufficient documentation

## 2018-11-17 DIAGNOSIS — E1159 Type 2 diabetes mellitus with other circulatory complications: Secondary | ICD-10-CM

## 2018-11-17 DIAGNOSIS — Z794 Long term (current) use of insulin: Secondary | ICD-10-CM | POA: Diagnosis not present

## 2018-11-17 DIAGNOSIS — E119 Type 2 diabetes mellitus without complications: Secondary | ICD-10-CM | POA: Insufficient documentation

## 2018-11-17 MED ORDER — GLUCOSE BLOOD VI STRP: ORAL_STRIP | 12 refills | Status: DC

## 2018-11-17 MED ORDER — ACCU-CHEK FASTCLIX LANCETS MISC: 1.0000 | Freq: Three times a day (TID) | 3 refills | Status: DC

## 2018-11-17 NOTE — Progress Notes (Signed)
Virtual Visit via Video Note  I connected with Morgan [name] on 11/17/18 at  2:40 PM EDT by a video enabled telemedicine application and verified that I am speaking with the correct person using two identifiers.   I discussed the limitations of evaluation and management by telemedicine and the availability of in person appointments. The patient expressed understanding and agreed to proceed.   -Location of the patient : Home -Location of the provider : Office  -The names of all persons participating in the telemedicine service : CMA Lolita Rieger       PATIENT IDENTIFIER: Morgan [name] is a 53 y.o. female with a past medical history of IDDM, HTN, Bipolar d/o,hypothyroidism, Hx of CVA, and  CAD (S/P DES 10/2018) . The patient has followed with Endocrinology clinic since 09/25/2018 for consultative assistance with management of her diabetes.  DIABETIC HISTORY:  Morgan [name] was diagnosed with T2DM many years ago. She is intolerant to Metformin due to diarrhea. She has been on SU and actos in the past without reported intolerance. She has been on an insulin pump since 2017. Her hemoglobin A1c has ranged from 9.8% in 06/2018, peaking at 13.2% in 2017.   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 She was on medtronic pump with a daytime hourly rate of 8 u/hr from 9 am to MN. She was also on Ozempic 0.5 mg weekly.   SUBJECTIVE:   During the last visit (09/25/2018): We reduced basal rate to 2.4 u/hr  Today (11/17/2018): Morgan [name] is here for a 4 weeks virtual follow up on diabetes management.  She was checking her sugars soon after her last visit but since then she has had a tough time with influenza causing her to lose weigh , followed by a hospitalization for PNA and NSTEMI . Since then she has been weak and tired and has not been checking her sugar for weeks, except during the visit today with a BG of 402 mg/dL. Morgan [name] had just eaten without  entering carbohydrates. The patient has not had hypoglycemic episodes since the last clinic visit. Otherwise, the patient has not required any recent emergency interventions for hypoglycemia but has had recent hospitalizations for nausea and vomiting , was diagnosed with NSTEMI , S/P DES on 10/27/2018  ROS: As per HPI and as detailed below: Review of Systems  Constitutional: Positive for malaise/fatigue. Negative for fever.  HENT: Negative for congestion and sore throat.   Respiratory: Positive for shortness of breath. Negative for cough.   Cardiovascular: Negative for chest pain and palpitations.  Gastrointestinal: Positive for nausea and vomiting.  Neurological: Positive for dizziness.      HOME DIABETES REGIMEN:  Ozempic 1 mg weekly (Monday )  Pump and meter download:    Pump   METRONIC     Insulin type   Humalog     Basal rate       0000-0730  2.0 u/h    0730-0900  2.0 u/h    0900-0000  2.0 u/h       I:C ratio       0000  3.0                  Sensitivity       0000  20      Goal       0800-0000 120   0000 -0800 130  GLUCOSE LOG  Date Breakfast  Lunch  Supper  11/10/2018 308 319   3/28 443        HISTORY:  Past Medical History:  Past Medical History:  Diagnosis Date  . Anxiety   . Asthma   . Bipolar depression ([place])   . Chronic bronchitis ([place])   . Chronic esophagogastric ulcer   . Chronic stomach ulcer   . Fibroid   . GERD (gastroesophageal reflux disease)   . Heart murmur   . Hepatic steatosis   . History of blood transfusion 2010   "related to subclavian stent"  . History of hiatal hernia   . Hyperlipidemia   . Hypertension   . Hypothyroid   . Migraine    "cerebral migraines; 1-2/month" (06/29/2018)  . 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 Stroke [facility])    "I've had 2; most recent one was ~ 2015 or before; affected balance" (06/29/2018)  . Thalassemia minor   . Type II diabetes  mellitus ([place])   . Vitamin D deficiency     Past Surgical History:  Past Surgical History:  Procedure Laterality Date  . ABDOMINAL HYSTERECTOMY  2015   "still have 1 ovary"  . BIOPSY  07/25/2018   Procedure: BIOPSY;  Surgeon: Thornton Park, MD;  Location: [facility] ENDOSCOPY;  Service: Gastroenterology;;  . [place]; 1996  . CORONARY STENT INTERVENTION N/A 10/27/2018   Procedure: CORONARY STENT INTERVENTION;  Surgeon: Sherren Mocha, MD;  Location: [place] CV LAB;  Service: Cardiovascular;  Laterality: N/A;  . ESOPHAGOGASTRODUODENOSCOPY (EGD) WITH PROPOFOL N/A 07/25/2018   Procedure: ESOPHAGOGASTRODUODENOSCOPY (EGD) WITH PROPOFOL;  Surgeon: Thornton Park, MD;  Location: [place];  Service: Gastroenterology;  Laterality: N/A;  . KNEE RECONSTRUCTION Left 1980  . LAPAROSCOPIC CHOLECYSTECTOMY    . LEFT HEART CATH AND CORONARY ANGIOGRAPHY N/A 10/27/2018   Procedure: LEFT HEART CATH AND CORONARY ANGIOGRAPHY;  Surgeon: Sherren Mocha, MD;  Location: [place] CV LAB;  Service: Cardiovascular;  Laterality: N/A;  . SUBCLAVIAN STENT PLACEMENT Left 2010  . TONSILLECTOMY  1970     Social History:  reports that she quit smoking about 4 years ago. Her smoking use included cigarettes. She has a 72.00 pack-year smoking history. She has never used smokeless tobacco. She reports previous alcohol use. She reports previous drug use. Family History:  Family History  Problem Relation Age of Onset  . Diabetes Mother   . Hypertension Mother   . Hyperlipidemia Mother   . CAD Mother   . CAD Father   . Heart failure Father   . Hypertension Sister   . Hyperlipidemia Sister   . Hypertension Brother   . Hyperlipidemia Brother       HOME MEDICATIONS: Allergies as of 11/17/2018      Reactions   Ilosone [erythromycin] Anaphylaxis   Keflex [cephalexin] Anaphylaxis   Penicillins Anaphylaxis   Has patient had a PCN reaction causing immediate rash, facial/tongue/throat swelling,  SOB or lightheadedness with hypotension: YES Has patient had a PCN reaction causing severe rash involving mucus membranes or skin necrosis: NO Has patient had a PCN reaction that required hospitalizationNO Has patient had a PCN reaction occurring within the last 10 years: NO If all of the above answers are "NO", then may proceed with Cephalosporin use.   Iron Other (See Comments)   Morgan [name] reports condition limiting Iron intake.   Metformin And Related Other (See Comments)   Morgan [name] prefers to not take this medication   Morphine And Related Other (  See Comments)   Morgan [name] reports hallucinations      Medication List       Accurate as of November 17, 2018  2:34 PM. Always use your most recent med list.        aspirin 81 MG tablet Take 1 tablet (81 mg total) by mouth daily.   atorvastatin 80 MG tablet Commonly known as:  LIPITOR Take 1 tablet (80 mg total) by mouth daily.   butalbital-acetaminophen-caffeine 50-325-40 MG tablet Commonly known as:  FIORICET Take 1-2 tablets by mouth every 6 (six) hours as needed for headache or migraine.   clopidogrel 75 MG tablet Commonly known as:  PLAVIX Take 300 mg (4 tablets)  by mouth the first day, then take 75 mg by mouth daily.   Contour Next One Kit 1 kit by Does not apply route as directed.   EPINEPHrine 0.3 mg/0.3 mL Soaj injection Commonly known as:  EPI-PEN Inject 0.3 mg into the muscle once as needed (for an anaphylactic reaction).   FLUoxetine 20 MG tablet Commonly known as:  PROZAC Take 20 mg by mouth every morning.   furosemide 20 MG tablet Commonly known as:  LASIX Take 1 tablet (20 mg total) by mouth daily.   Glucagon Emergency 1 MG injection Generic drug:  glucagon Inject 1 mg into the vein once as needed (FOR ONSET OF HYPOGLYCEMIA).   insulin pump Soln Inject into the skin continuous. Humalog   lamoTRIgine 25 MG tablet Commonly known as:  LAMICTAL Take 50 mg by mouth daily.   Latuda 60 MG Tabs Generic drug:  Lurasidone HCl  Take 60 mg by mouth daily.   levothyroxine 150 MCG tablet Commonly known as:  SYNTHROID Take 150 mcg by mouth daily.   losartan 100 MG tablet Commonly known as:  COZAAR Take 1 tablet (100 mg total) by mouth daily.   metoprolol tartrate 50 MG tablet Commonly known as:  LOPRESSOR Take 1 tablet (50 mg total) by mouth 2 (two) times daily.   multivitamin with minerals Tabs tablet Take 1 tablet by mouth daily.   nitroGLYCERIN 0.4 MG SL tablet Commonly known as:  NITROSTAT Place 1 tablet (0.4 mg total) under the tongue every 5 (five) minutes x 3 doses as needed for chest pain.   Ozempic (0.25 or 0.5 MG/DOSE) 2 MG/1.5ML Sopn Generic drug:  Semaglutide(0.25 or 0.5MG/DOS) Inject 0.5 mg into the skin once a week. On Monday   pantoprazole 40 MG tablet Commonly known as:  PROTONIX Take 1 tablet (40 mg total) by mouth 2 (two) times daily before a meal. X 8 weeks   Potassium Chloride ER 20 MEQ Tbcr Take 40 mEq by mouth daily.   promethazine 12.5 MG tablet Commonly known as:  PHENERGAN Take 1 tablet (12.5 mg total) by mouth every 6 (six) hours as needed for nausea or vomiting.   traMADol 50 MG tablet Commonly known as:  ULTRAM Take 0.5 tablets (25 mg total) by mouth every 12 (twelve) hours as needed for moderate pain.   Vitamin D3 50 MCG (2000 UT) Tabs Take 2,000-4,000 Units by mouth See admin instructions. Take 4,000 units by mouth once a day on Mon/Wed/Fri and 2,000 units on Sun/Tues/Thurs/Sat         DATA REVIEWED:  Lab Results  Component Value Date   HGBA1C 10.4 (H) 07/26/2018   HGBA1C 9.8 (H) 06/27/2018   HGBA1C 13.2 (H) 05/15/2016   Lab Results  Component Value Date   CREATININE 0.94 10/31/2018     ASSESSMENT /  PLAN / RECOMMENDATIONS:   1) Type 2 Diabetes Mellitus, Poorly controlled, With macrovascular complications - Most recent A1c of 10.4 %. Goal A1c < 7.0 %.   Plan:  - Morgan [name] continues with improper use of the insulin pump and trying to depend on her basal  rate for prandial coverage. She has not checked her BG's in the past 2 weeks, she continues to eat without entering CHO count. She is hopeful about receiving the CGM Guardian , as it will link with her Medtronic pump  And she will be able to at least receive corrections.  - I have encouraged her about the importance of entering CHO data in the pump before she eats so her glucose doesn't increase to 400's again - Since she continues with nausea and vomiting, I have asked her to stop using the Ozempic at this time, once nausea and vomiting resolve, she will contact me and we will restart her on 0.25 mg weekly again and titrate up.  - Will increase basal rate again    MEDICATIONS:  Stop Ozempic  Pump   METRONIC     Insulin type   Humalog     Basal rate       0000-0730  2.4 u/h    0730-0900  2.4 u/h    0900-0000  2.4 u/h       I:C ratio       0000  3.0                  Sensitivity       0000  20      Goal       0800-0000 120   0000 -0800 130     EDUCATION / INSTRUCTIONS:  BG monitoring instructions: Patient is instructed to check her blood sugars 4 times a day, before meals and bedtime.  Call [facility] if: BG persistently < 70 or > 300. . I reviewed the Rule of 15 for the treatment of hypoglycemia in detail with the patient. Literature supplied.     I discussed the assessment and treatment plan with the patient. The patient was provided an opportunity to ask questions and all were answered. The patient agreed with the plan and demonstrated an understanding of the instructions.   The patient was advised to call back or seek an in-person evaluation if the symptoms worsen or if the condition fails to improve as anticipated.     F/U in 8 weeks    Signed electronically by: Mack Guise, MD  [facility]  [address] [place], [place] [address] Phone:  [phone] FAX: [phone]   CC: [address] [facility] [place] [address] Phone: [phone]  Fax: [phone]  Return to Endocrinology clinic as below: Future Appointments  Date Time Provider [place]  11/17/2018  2:40 PM Lew Prout, Melanie Crazier, MD [facility] None  12/04/2018  2:15 PM Josue Hector, MD [facility]

## 2018-11-17 NOTE — Telephone Encounter (Signed)
Patient has called in regards to needing to download a program to download her devices for the doctor for her appt. 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

## 2018-11-20 ENCOUNTER — Telehealth: Payer: Self-pay | Admitting: Cardiovascular Disease

## 2018-11-20 ENCOUNTER — Other Ambulatory Visit: Payer: Self-pay | Admitting: Cardiovascular Disease

## 2018-11-20 MED ORDER — TICAGRELOR 90 MG PO TABS: 90.0000 mg | ORAL_TABLET | Freq: Two times a day (BID) | ORAL | 3 refills | Status: DC

## 2018-11-20 MED ORDER — FUROSEMIDE 20 MG PO TABS: 20.0000 mg | ORAL_TABLET | Freq: Every day | ORAL | 3 refills | Status: DC

## 2018-11-20 MED ORDER — METOPROLOL TARTRATE 50 MG PO TABS: 50.0000 mg | ORAL_TABLET | Freq: Two times a day (BID) | ORAL | 3 refills | Status: DC

## 2018-11-20 NOTE — Telephone Encounter (Signed)
Morgan [name] is better for her

## 2018-11-20 NOTE — Telephone Encounter (Signed)
° ° ° ° °  Pt c/o medication issue:  1. Name of Medication: Plavix, Brilinta  2. How are you currently taking this medication (dosage and times per day)? n/a  3. Are you having a reaction (difficulty breathing--STAT)? no  4. What is your medication issue?Patient calling, states she needs to know what medication to continue taking. Her pharmacy was able to fill both

## 2018-11-20 NOTE — Telephone Encounter (Signed)
Pt's medications were sent to pt's pharmacy as requested. Confirmation received.  

## 2018-11-20 NOTE — Telephone Encounter (Signed)
Patient is able to get brilinta now for a year. Dr. Johnsie Cancel had changed to plavix due to cost for patient. Patient received assistance and now can take brilinta. Will forward to Dr. Johnsie Cancel so he is aware.

## 2018-11-27 ENCOUNTER — Encounter: Payer: Self-pay | Admitting: Internal Medicine

## 2018-12-02 NOTE — Progress Notes (Signed)
Virtual Visit via Video Note   This visit type was conducted due to national recommendations for restrictions regarding the COVID-19 Pandemic (e.g. social distancing) in an effort to limit this patient's exposure and mitigate transmission in our community.  Due to her co-morbid illnesses, this patient is at least at moderate risk for complications without adequate follow up.  This format is felt to be most appropriate for this patient at this time.  All issues noted in this document were discussed and addressed.  A limited physical exam was performed with this format.  Please refer to the patient's chart for her consent to telehealth for [facility].   Evaluation Performed:  Follow-up visit  Date:  12/04/2018   ID:  Morgan [name], DOB 1965/10/09, MRN [id number]  Patient Location: Home  Provider Location: Office  PCP:  Bradly Bienenstock., MD  Cardiologist:  Jenkins Rouge, MD   Electrophysiologist:  None   Chief Complaint:  CAD/DM/Dyspnea  History of Present Illness:    Morgan [name] is a 53 y.o. female who presents via audio/video conferencing for a telehealth visit today.    TOC d/c from hospital 10/31/18 Presented with intractable nausea and vomiting and DKA Noted to have elevated troponin max 1.0 Cath reviewed Dr Burt Knack 10/27/18 with severe LAD/D1 stenosis Rx with DES to both arteries no significant RCA/Circumflex disease LvEDP mildly elevated and had some post cath CHF Rx with Lasix. Echo 10/26/18 with EF 45-50% with RWMA in septal and apex consistent with her CAD.  D/c with lasix 20 mg , losartan 50 mg, lopressor 50 bid and Brilinta with ASA Had issues affording/ getting Brilinta  Seems to have high HR and BP [employer] not covering [place] with exertional dyspnea, fatigue and nausea Her Bipolar disease is fairly new diagnosis end of last year. Had been Rx years of depression but things changed in December 2019 Now seeing psychologist/psychiatrist weekly   More edema partly due to sitting at computer all day. Needs more lasix    The patient does not have symptoms concerning for COVID-19 infection (fever, chills, cough, or new shortness of breath).    Past Medical History:  Diagnosis Date  . Anxiety   . Asthma   . Bipolar depression ([place])   . Chronic bronchitis ([place])   . Chronic esophagogastric ulcer   . Chronic stomach ulcer   . Fibroid   . GERD (gastroesophageal reflux disease)   . Heart murmur   . Hepatic steatosis   . History of blood transfusion 2010   "related to subclavian stent"  . History of hiatal hernia   . Hyperlipidemia   . Hypertension   . Hypothyroid   . Migraine    "cerebral migraines; 1-2/month" (06/29/2018)  . 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 Stroke [facility])    "I've had 2; most recent one was ~ 2015 or before; affected balance" (06/29/2018)  . Thalassemia minor   . Type II diabetes mellitus ([place])   . Vitamin D deficiency    Past Surgical History:  Procedure Laterality Date  . ABDOMINAL HYSTERECTOMY  2015   "still have 1 ovary"  . BIOPSY  07/25/2018   Procedure: BIOPSY;  Surgeon: Thornton Park, MD;  Location: [facility] ENDOSCOPY;  Service: Gastroenterology;;  . [place]; 1996  . CORONARY STENT INTERVENTION N/A 10/27/2018   Procedure: CORONARY STENT INTERVENTION;  Surgeon: Sherren Mocha, MD;  Location: [place] CV LAB;  Service: Cardiovascular;  Laterality: N/A;  .  ESOPHAGOGASTRODUODENOSCOPY (EGD) WITH PROPOFOL N/A 07/25/2018   Procedure: ESOPHAGOGASTRODUODENOSCOPY (EGD) WITH PROPOFOL;  Surgeon: Thornton Park, MD;  Location: [place];  Service: Gastroenterology;  Laterality: N/A;  . KNEE RECONSTRUCTION Left 1980  . LAPAROSCOPIC CHOLECYSTECTOMY    . LEFT HEART CATH AND CORONARY ANGIOGRAPHY N/A 10/27/2018   Procedure: LEFT HEART CATH AND CORONARY ANGIOGRAPHY;  Surgeon: Sherren Mocha, MD;  Location: [place] CV LAB;  Service: Cardiovascular;  Laterality: N/A;  . SUBCLAVIAN  STENT PLACEMENT Left 2010  . TONSILLECTOMY  1970     Current Meds  Medication Sig  . Accu-Chek FastClix Lancets MISC 1 Package by Does not apply route 3 (three) times daily.  [name] aspirin 81 MG tablet Take 1 tablet (81 mg total) by mouth daily.  [name] atorvastatin (LIPITOR) 80 MG tablet Take 1 tablet (80 mg total) by mouth daily.  . Blood Glucose Monitoring Suppl (CONTOUR NEXT ONE) KIT 1 kit by Does not apply route as directed.  . butalbital-acetaminophen-caffeine (FIORICET, ESGIC) 50-325-40 MG tablet Take 1-2 tablets by mouth every 6 (six) hours as needed for headache or migraine.  . Cholecalciferol (VITAMIN D3) 50 MCG (2000 UT) TABS Take 2,000-4,000 Units by mouth See admin instructions. Take 4,000 units by mouth once a day on Mon/Wed/Fri and 2,000 units on Sun/Tues/Thurs/Sat  . EPINEPHrine 0.3 mg/0.3 mL IJ SOAJ injection Inject 0.3 mg into the muscle once as needed (for an anaphylactic reaction).   [name] FLUoxetine (PROZAC) 20 MG tablet Take 20 mg by mouth every morning.   . furosemide (LASIX) 20 MG tablet Take 1 tablet (20 mg total) by mouth daily.  [name] GLUCAGON EMERGENCY 1 MG injection Inject 1 mg into the vein once as needed (FOR ONSET OF HYPOGLYCEMIA).   [name] glucose blood (CONTOUR NEXT TEST) test strip Use as instructed to test blood sugars 2-3 times daily  . Insulin Human (INSULIN PUMP) SOLN Inject into the skin continuous. Humalog  . levothyroxine (SYNTHROID, LEVOTHROID) 150 MCG tablet Take 150 mcg by mouth daily.   [name] losartan (COZAAR) 100 MG tablet Take 1 tablet (100 mg total) by mouth daily.  . Lurasidone HCl (LATUDA) 60 MG TABS Take 60 mg by mouth daily.  . metoprolol tartrate (LOPRESSOR) 50 MG tablet Take 1 tablet (50 mg total) by mouth 2 (two) times daily.  . Multiple Vitamin (MULTIVITAMIN WITH MINERALS) TABS tablet Take 1 tablet by mouth daily.  . nitroGLYCERIN (NITROSTAT) 0.4 MG SL tablet Place 1 tablet (0.4 mg total) under the tongue every 5 (five) minutes x 3 doses as needed for chest pain.   . pantoprazole (PROTONIX) 40 MG tablet Take 1 tablet (40 mg total) by mouth 2 (two) times daily before a meal. X 8 weeks  . Potassium Chloride ER 20 MEQ TBCR Take 40 mEq by mouth daily.  . promethazine (PHENERGAN) 12.5 MG tablet Take 1 tablet (12.5 mg total) by mouth every 6 (six) hours as needed for nausea or vomiting.  . ticagrelor (BRILINTA) 90 MG TABS tablet Take 1 tablet (90 mg total) by mouth 2 (two) times daily.  . traMADol (ULTRAM) 50 MG tablet Take 0.5 tablets (25 mg total) by mouth every 12 (twelve) hours as needed for moderate pain.  . [DISCONTINUED] lamoTRIgine (LAMICTAL) 25 MG tablet Take 50 mg by mouth daily.     Allergies:   Ilosone [erythromycin]; Keflex [cephalexin]; Penicillins; Iron; Metformin and related; and Morphine and related   Social History   Tobacco Use  . Smoking status: Former Smoker    Packs/day: 2.00  Years: 36.00    Pack years: 72.00    Types: Cigarettes    Last attempt to quit: 05/14/2014    Years since quitting: 4.5  . Smokeless tobacco: Never Used  Substance Use Topics  . Alcohol use: Not Currently  . Drug use: Not Currently     Family Hx: The patient's family history includes CAD in her father and mother; Diabetes in her mother; Heart failure in her father; Hyperlipidemia in her brother, mother, and sister; Hypertension in her brother, mother, and sister.  ROS:   Please see the history of present illness.     All other systems reviewed and are negative.   Prior CV studies:   The following studies were reviewed today: Echo 10/26/18 Cath 10/27/18   Labs/Other Tests and Data Reviewed:    EKG  10/30/18 SR rate 103 normal   Recent Labs: 07/26/2018: TSH 2.978 10/26/2018: Magnesium 2.2 10/29/2018: ALT 27 10/30/2018: Hemoglobin 13.2; Platelets 357 10/31/2018: BUN 13; Creatinine, Ser 0.94; Potassium 3.2; Sodium 138   Recent Lipid Panel Lab Results  Component Value Date/Time   CHOL 190 09/25/2018 03:05 PM   TRIG 245.0 (H) 09/25/2018 03:05  PM   HDL 37.50 (L) 09/25/2018 03:05 PM   CHOLHDL 5 09/25/2018 03:05 PM   LDLDIRECT 144.0 09/25/2018 03:05 PM    Wt Readings from Last 3 Encounters:  12/04/18 106.1 kg  11/16/18 97.5 kg  10/31/18 100.3 kg     Objective:    Vital Signs:  BP (!) 154/68   Pulse 82   Ht 5' 2.5" (1.588 m)   Wt 106.1 kg   LMP 08/03/2013   BMI 42.12 kg/m    Well nourished, well developed female in no acute distress. Skin warm and dry No tachypnea No JVP elevation  Plus 2 LE edema  ASSESSMENT & PLAN:    1. CAD:  DES to mid LAD/D1 10/27/18 ? Atypical presentation of SEMI with nausea and vomiting in poorly controlled diabetic. Troponin peak only 1.0 Continue DAT got patient assist ence for Brilinta  statin and beta blocker No significant disease in RCA/Circumflex Refer to cardiac rehab when opens up again  2. CHF:  EF 45-50% with RWMA in LAD territory increase lasix to 20 bid with Kdur F/u with me in 3 months with echo   3. DM:  Discussed low carb diet.  Target hemoglobin A1c is 6.5 or less.  Continue current medications. 4. Thyroid:  On replacement TSH normal 5. Bipolar:  On Sinequan and Prozac f/u with Psych 6. HLD:  On statin LDL 144 in hospital f/u labs in 3 months normal LFTls  COVID-19 Education: The signs and symptoms of COVID-19 were discussed with the patient and how to seek care for testing (follow up with PCP or arrange E-visit).  The importance of social distancing was discussed today.  Time:   Today, I have spent 30 minutes with the patient with telehealth technology discussing the above problems.     Medication Adjustments/Labs and Tests Ordered: Current medicines are reviewed at length with the patient today.  Concerns regarding medicines are outlined above.   Tests Ordered:  Echo 3 months for ischemic DCM  Medication Changes: No orders of the defined types were placed in this encounter.   Disposition:  Follow up in 3 monhths with echo for ischemic DCM  Signed, Jenkins Rouge, MD  12/04/2018 2:17 PM    [place]

## 2018-12-04 ENCOUNTER — Encounter: Payer: Self-pay | Admitting: Cardiovascular Disease

## 2018-12-04 ENCOUNTER — Other Ambulatory Visit: Payer: Self-pay

## 2018-12-04 ENCOUNTER — Encounter: Payer: Self-pay | Admitting: Internal Medicine

## 2018-12-04 ENCOUNTER — Telehealth (INDEPENDENT_AMBULATORY_CARE_PROVIDER_SITE_OTHER): Payer: 59 | Admitting: Cardiovascular Disease

## 2018-12-04 VITALS — BP 154/68 | HR 82 | Ht 62.5 in | Wt 234.0 lb

## 2018-12-04 DIAGNOSIS — I2510 Atherosclerotic heart disease of native coronary artery without angina pectoris: Secondary | ICD-10-CM | POA: Diagnosis not present

## 2018-12-04 DIAGNOSIS — I5022 Chronic systolic (congestive) heart failure: Secondary | ICD-10-CM

## 2018-12-04 MED ORDER — FUROSEMIDE 20 MG PO TABS: 20.0000 mg | ORAL_TABLET | Freq: Every day | ORAL | 3 refills | Status: DC

## 2018-12-04 MED ORDER — METOPROLOL TARTRATE 50 MG PO TABS: 50.0000 mg | ORAL_TABLET | Freq: Two times a day (BID) | ORAL | 3 refills | Status: DC

## 2018-12-04 MED ORDER — ATORVASTATIN CALCIUM 80 MG PO TABS: 80.0000 mg | ORAL_TABLET | Freq: Every day | ORAL | 0 refills | Status: DC

## 2018-12-04 MED ORDER — POTASSIUM CHLORIDE ER 10 MEQ PO TBCR: 10.0000 meq | EXTENDED_RELEASE_TABLET | Freq: Two times a day (BID) | ORAL | 3 refills | Status: DC

## 2018-12-04 NOTE — Patient Instructions (Signed)
Medication Instructions:  Your physician has recommended you make the following change in your medication: 1- Lasix 20 mg by mouth twice daily 2- Potassium 10 meq by mouth twice daily  If you need a refill on your cardiac medications before your next appointment, please call your pharmacy.   Lab work: Your physician recommends that you return for lab work in: 4 weeks for BMET  If you have labs (blood work) drawn today and your tests are completely normal, you will receive your results only by: [name] MyChart Message (if you have MyChart) OR . A paper copy in the mail If you have any lab test that is abnormal or we need to change your treatment, we will call you to review the results.  Testing/Procedures: Your physician has requested that you have an echocardiogram in 3 months. Echocardiography is a painless test that uses sound waves to create images of your heart. It provides your doctor with information about the size and shape of your heart and how well your heart's chambers and valves are working. This procedure takes approximately one hour. There are no restrictions for this procedure.  Follow-Up: At [facility], you and your health needs are our priority.  As part of our continuing mission to provide you with exceptional heart care, we have created designated Provider Care Teams.  These Care Teams include your primary Cardiologist (physician) and Advanced Practice Providers (APPs -  Physician Assistants and Nurse Practitioners) who all work together to provide you with the care you need, when you need it. You will need a follow up appointment in 3 months.    You may see Jenkins Rouge, MD or one of the following Advanced Practice Providers on your designated Care Team:   Truitt Merle, NP Cecilie Kicks, NP . Kathyrn Drown, NP

## 2018-12-05 ENCOUNTER — Telehealth: Payer: Self-pay | Admitting: Internal Medicine

## 2018-12-05 MED ORDER — SEMAGLUTIDE(0.25 OR 0.5MG/DOS) 2 MG/1.5ML ~~LOC~~ SOPN: 0.2500 mg | PEN_INJECTOR | SUBCUTANEOUS | 6 refills | Status: DC

## 2018-12-05 NOTE — Telephone Encounter (Signed)
Pt called the office requesting a look at her Medtronic download.     PUMP SETTINGS & DOWNLOAD   BASAL SETTINGS: Medtronic Time  Basal Rate (units/ hr)   0000 2.4    BOLUS SETTINGS: Insulin:Carb Ratio: 4 Sensitivity: 20 Target BG: 120-130 Active Insulin Time: 4 hours  CURRENT PUMP STATISTICS: Average BG: 191 +/- 59 BG Readings: 6.5 / day Average Daily Carbs (g): 303+/- 71 Average Total Daily Insulin: 136.1 +/- 20.2 Average Daily Basal: 42  Average Daily Bolus: 58    Pt would like to restart Ozemopic as her nausea and vomiting have resolved.     Recommendations  Start ozempic at 0.25 mg weekly for 4 weeks, if no side effects may increase to 0.5 mg weekly No changes to pump setting, her BG's are not in the 300's or 400's anymore. I am hoping with the addition of the ozemopic , her BG's will reach target.    Abby Nena Jordan, MD  [facility] Endocrinology  [facility] [address] [place], [place] [address] Phone: [phone] FAX: [phone]

## 2018-12-05 NOTE — Telephone Encounter (Signed)
No vm could not leave message will try back later

## 2018-12-06 NOTE — Telephone Encounter (Signed)
Pt aware of changes and stated that she understood changes

## 2018-12-08 ENCOUNTER — Encounter: Payer: Self-pay | Admitting: Internal Medicine

## 2018-12-10 ENCOUNTER — Encounter: Payer: Self-pay | Admitting: Internal Medicine

## 2018-12-11 ENCOUNTER — Other Ambulatory Visit: Payer: Self-pay

## 2018-12-11 MED ORDER — FUROSEMIDE 20 MG PO TABS: 20.0000 mg | ORAL_TABLET | Freq: Two times a day (BID) | ORAL | 3 refills | Status: DC

## 2018-12-11 MED ORDER — SEMAGLUTIDE(0.25 OR 0.5MG/DOS) 2 MG/1.5ML ~~LOC~~ SOPN: 0.2500 mg | PEN_INJECTOR | SUBCUTANEOUS | 6 refills | Status: DC

## 2018-12-12 ENCOUNTER — Encounter: Payer: Self-pay | Admitting: Internal Medicine

## 2018-12-21 ENCOUNTER — Encounter: Payer: Self-pay | Admitting: Internal Medicine

## 2019-01-02 ENCOUNTER — Other Ambulatory Visit: Payer: 59

## 2019-01-02 ENCOUNTER — Other Ambulatory Visit: Payer: Self-pay

## 2019-01-02 DIAGNOSIS — I2510 Atherosclerotic heart disease of native coronary artery without angina pectoris: Secondary | ICD-10-CM

## 2019-01-02 DIAGNOSIS — I5022 Chronic systolic (congestive) heart failure: Secondary | ICD-10-CM

## 2019-01-03 ENCOUNTER — Telehealth: Payer: Self-pay | Admitting: Cardiovascular Disease

## 2019-01-03 LAB — BASIC METABOLIC PANEL
BUN/Creatinine Ratio: 17 (ref 9–23)
BUN: 25 mg/dL — ABNORMAL HIGH (ref 6–24)
CO2: 20 mmol/L (ref 20–29)
Calcium: 9.6 mg/dL (ref 8.7–10.2)
Chloride: 83 mmol/L — ABNORMAL LOW (ref 96–106)
Creatinine, Ser: 1.44 mg/dL — ABNORMAL HIGH (ref 0.57–1.00)
GFR calc Af Amer: 47 mL/min/{1.73_m2} — ABNORMAL LOW (ref >59)
GFR calc non Af Amer: 41 mL/min/{1.73_m2} — ABNORMAL LOW (ref >59)
Glucose: 702 mg/dL (ref 65–99)
Potassium: 5.2 mmol/L (ref 3.5–5.2)
Sodium: 128 mmol/L — ABNORMAL LOW (ref 134–144)

## 2019-01-03 NOTE — Telephone Encounter (Signed)
Result Notes for Basic metabolic panel   Notes recorded by Michaelyn Barter, RN on 01/03/2019 at 8:36 AM EDT Called patient about her lab results. Per Dr. Johnsie Cancel, BS way too high needs to see primary or her DM doctor ASAP. Patient will call her PCP. Sent copy of lab work to Dr. Sabra Heck, PCP. ------  Notes recorded by Josue Hector, MD on 01/03/2019 at 7:34 AM EDT BS way too high needs to see primary or her DM doctor ASAP     [facility] calling to report a critical BS on this pt, of 702.  Pts Primary Cardiologist Dr Johnsie Cancel and covering RN already aware of pts glucose level and they have already advised on this, as indicated in this note above.

## 2019-01-09 ENCOUNTER — Ambulatory Visit: Payer: 59 | Admitting: Internal Medicine

## 2019-01-12 ENCOUNTER — Ambulatory Visit: Payer: 59 | Admitting: Internal Medicine

## 2019-01-22 ENCOUNTER — Telehealth: Payer: Self-pay | Admitting: Internal Medicine

## 2019-01-22 MED ORDER — OZEMPIC (0.25 OR 0.5 MG/DOSE) 2 MG/1.5ML ~~LOC~~ SOPN: 0.2500 mg | PEN_INJECTOR | SUBCUTANEOUS | 6 refills | Status: DC

## 2019-01-22 NOTE — Telephone Encounter (Signed)
Spoke with the patient and she stated she can not come in until next week because she needs a pay check first-patient is already scheduled for 01/30/19 and would like to keep that appointment-I did tell her MD would like this to be in person and she agreed

## 2019-01-22 NOTE — Telephone Encounter (Signed)
Spoke to patient and she gave the following blood sugar readings: Yesterday around noon before lunch 446                                        Before dinner 503                                         Last night 10:28 pm 398 Today:                                        Before lunch 353                                        Current reading  Patient reported terrible headache and feels miserable-has had water and ice tea w/o sugar but has not eaten anything today because she feels it will make her vomit- please advise

## 2019-01-22 NOTE — Telephone Encounter (Signed)
Spoke to Morgan [name] on 01/22/19 at 1700   Apparently she had a serum glucose on 6/2 that was 702 mg/dL   We have tried to down load her pump on 01/09/2019 and it seems that she wasn't using her pump nor her sensor. Pt states she was not using the sensor but continued to use her pump  She has not started the ozempic yet.   This afternoon before lunch her BG was 344 mg/dL .She received a bolus and correction (ate chicken salad) during our coversation her BG was 341 mg/dL   I have advised her to check BG again in 30 minutes and receive a correction bolus. She was advised to avoid eating and to only drink water.   An hour after that she should check her BG again and its its not trending down she will have to change the pump site.   Pt expressed understanding and was able to read back   Pt advised to report to the ER if she feels so poorly, as its difficult for me for figure out what has been causing hyperglycemia for the past 20 days.   Historically she is known to eat without a bolus.    Abby Nena Jordan, MD  [facility] Endocrinology  [facility] [address] [place], [place] [address] Phone: [phone] FAX: [phone]

## 2019-01-22 NOTE — Telephone Encounter (Signed)
Patient called re: patient's blood sugars were 500+ yesterday. Today blood sugars are at 353. Patient is having migraine/severe headache. Please call patient at ph# [phone] to advise.

## 2019-01-24 ENCOUNTER — Other Ambulatory Visit: Payer: Self-pay

## 2019-01-24 ENCOUNTER — Encounter: Payer: 59 | Attending: Internal Medicine | Admitting: Nutrition

## 2019-01-24 ENCOUNTER — Telehealth: Payer: Self-pay | Admitting: Nutrition

## 2019-01-24 DIAGNOSIS — E669 Obesity, unspecified: Secondary | ICD-10-CM | POA: Diagnosis present

## 2019-01-24 DIAGNOSIS — E1169 Type 2 diabetes mellitus with other specified complication: Secondary | ICD-10-CM | POA: Insufficient documentation

## 2019-01-24 NOTE — Telephone Encounter (Signed)
Patient called saying that 2 days ago the canulla was bent when she was told to put in a new infusion set by Dr. Kelton Pillar.  After putting in a new set, her blood sugar dropped to 44 at 2:30AM.  She then rebounded and went very high the next day.  She reported dropping low again last night to 45 at 3AM.  Blood sugar is now 384.  She has 12u of IOB. She was told to pull the infusion set.  She says "it was a little bent.  I talked her through putting in  another infusion set.  She was told to download her pump and will show it to Dr. Kelton Pillar

## 2019-01-26 ENCOUNTER — Telehealth: Payer: Self-pay | Admitting: Internal Medicine

## 2019-01-26 NOTE — Telephone Encounter (Signed)
Patient called to inform she started her Ozempic today. And also she was having problems with her sensors that need to be refilled and also increase the amount.  Please Advise, Thanks

## 2019-01-26 NOTE — Telephone Encounter (Signed)
Informed pt and she stated that she has an appt on Tuesday, told her that I would forward this to Vaughan Basta so that she can possible see her before or after that appt.

## 2019-01-26 NOTE — Telephone Encounter (Signed)
Please advise 

## 2019-01-26 NOTE — Telephone Encounter (Signed)
Called to inform pt and her vm wanted a remote access code so I was not able to leave a vm

## 2019-01-26 NOTE — Telephone Encounter (Signed)
Spoke to pt and she stated that she was not sure how to change or where to go to change that setting, she asked for you to call her and walk her through it if possible.

## 2019-01-29 ENCOUNTER — Telehealth: Payer: Self-pay | Admitting: Nutrition

## 2019-01-29 NOTE — Telephone Encounter (Signed)
Patient did not want to make the changes over the phone. She preferred to do it in person tomorrow.    I reminded her that I had given her a handout on how to make the changes,and that she was told that she can always call the helpline on the back of the pump to help with this.  She did not repond.  To see her after her visit with Dr. Kelton Pillar

## 2019-01-30 ENCOUNTER — Encounter: Payer: 59 | Admitting: Nutrition

## 2019-01-30 ENCOUNTER — Other Ambulatory Visit: Payer: Self-pay

## 2019-01-30 ENCOUNTER — Ambulatory Visit (INDEPENDENT_AMBULATORY_CARE_PROVIDER_SITE_OTHER): Payer: 59 | Admitting: Internal Medicine

## 2019-01-30 ENCOUNTER — Encounter: Payer: Self-pay | Admitting: Internal Medicine

## 2019-01-30 VITALS — BP 118/68 | HR 77 | Temp 98.9°F | Ht 63.0 in | Wt 235.8 lb

## 2019-01-30 DIAGNOSIS — E1169 Type 2 diabetes mellitus with other specified complication: Secondary | ICD-10-CM | POA: Diagnosis not present

## 2019-01-30 DIAGNOSIS — Z794 Long term (current) use of insulin: Secondary | ICD-10-CM | POA: Diagnosis not present

## 2019-01-30 DIAGNOSIS — E1165 Type 2 diabetes mellitus with hyperglycemia: Secondary | ICD-10-CM

## 2019-01-30 LAB — POCT GLYCOSYLATED HEMOGLOBIN (HGB A1C): Hemoglobin A1C: 10.4 % — AB (ref 4.0–5.6)

## 2019-01-30 MED ORDER — CONTOUR NEXT TEST VI STRP: ORAL_STRIP | 12 refills | Status: DC

## 2019-01-30 NOTE — Progress Notes (Signed)
PATIENT IDENTIFIER: Morgan [name] is a 53 y.o. female with a past medical history of IDDM, HTN, Bipolar d/o,hypothyroidism, Hx of CVA, and  CAD (S/P DES 10/2018) . The patient has followed with Endocrinology clinic since 09/25/2018 for consultative assistance with management of her diabetes.  DIABETIC HISTORY:  Morgan [name] was diagnosed with T2DM many years ago. She is intolerant to Metformin due to diarrhea. She has been on SU and actos in the past without reported intolerance. She has been on an insulin pump since 2017. Her hemoglobin A1c has ranged from 9.8% in 06/2018, peaking at 13.2% in 2017.   Works 11 AM to 10 pm for [employer]   On her initial presentation to our clinic her A1c was 10.4 %. She was on medtronic pump with a daytime hourly rate of 8 u/hr from 9 am to MN. She was also on Ozempic 0.5 mg weekly.   She is intolerant to Ozempic 1.0 mg weekly dosing due to GI side effects   SUBJECTIVE:   During the last visit (11/17/2018): We increased basal rate to 2.4 u/hr  Today (01/30/2019): Morgan [name] is here for a follow up on diabetes management.  She has been on the guardian sensor. Since her last visit to our clinic, she has had high BG readings > 700 mg/dL, turns out her pump has been malfunctioning and required a couple site changes. Her nausea and vomiting has resolved.      ROS: As per HPI and as detailed below: Review of Systems  Constitutional: Negative for fever and malaise/fatigue.  HENT: Negative for congestion and sore throat.   Respiratory: Negative for cough and shortness of breath.   Cardiovascular: Negative for chest pain and palpitations.  Gastrointestinal: Negative for nausea and vomiting.      HOME DIABETES REGIMEN:  Ozempic 0.5 mg weekly   Pump and meter download:    Pump   METRONIC     Insulin type   Humalog     Basal rate       0000-0730  2.4 u/h    0730-0900  2.4 u/h    0900-0000  2.4 u/h       I:C ratio       0000  4.0                   Sensitivity       0000  20      Goal       0800-0000 120   0000 -0800 130            HISTORY:  Past Medical History:  Past Medical History:  Diagnosis Date  . Anxiety   . Asthma   . Bipolar depression ([place])   . Chronic bronchitis ([place])   . Chronic esophagogastric ulcer   . Chronic stomach ulcer   . Fibroid   . GERD (gastroesophageal reflux disease)   . Heart murmur   . Hepatic steatosis   . History of blood transfusion 2010   "related to subclavian stent"  . History of hiatal hernia   . Hyperlipidemia   . Hypertension   . Hypothyroid   . Migraine    "cerebral migraines; 1-2/month" (06/29/2018)  . 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 Stroke [facility])    "I've had 2; most recent one was ~ 2015 or before; affected balance" (06/29/2018)  . Thalassemia minor   . Type II diabetes mellitus ([place])   . Vitamin D deficiency  Past Surgical History:  Past Surgical History:  Procedure Laterality Date  . ABDOMINAL HYSTERECTOMY  2015   "still have 1 ovary"  . BIOPSY  07/25/2018   Procedure: BIOPSY;  Surgeon: Thornton Park, MD;  Location: [facility] ENDOSCOPY;  Service: Gastroenterology;;  . [place]; 1996  . CORONARY STENT INTERVENTION N/A 10/27/2018   Procedure: CORONARY STENT INTERVENTION;  Surgeon: Sherren Mocha, MD;  Location: [place] CV LAB;  Service: Cardiovascular;  Laterality: N/A;  . ESOPHAGOGASTRODUODENOSCOPY (EGD) WITH PROPOFOL N/A 07/25/2018   Procedure: ESOPHAGOGASTRODUODENOSCOPY (EGD) WITH PROPOFOL;  Surgeon: Thornton Park, MD;  Location: [place];  Service: Gastroenterology;  Laterality: N/A;  . KNEE RECONSTRUCTION Left 1980  . LAPAROSCOPIC CHOLECYSTECTOMY    . LEFT HEART CATH AND CORONARY ANGIOGRAPHY N/A 10/27/2018   Procedure: LEFT HEART CATH AND CORONARY ANGIOGRAPHY;  Surgeon: Sherren Mocha, MD;  Location: [place] CV LAB;  Service: Cardiovascular;  Laterality: N/A;  . SUBCLAVIAN  STENT PLACEMENT Left 2010  . TONSILLECTOMY  1970    Social History:  reports that she quit smoking about 4 years ago. Her smoking use included cigarettes. She has a 72.00 pack-year smoking history. She has never used smokeless tobacco. She reports previous alcohol use. She reports previous drug use. Family History:  Family History  Problem Relation Age of Onset  . Diabetes Mother   . Hypertension Mother   . Hyperlipidemia Mother   . CAD Mother   . CAD Father   . Heart failure Father   . Hypertension Sister   . Hyperlipidemia Sister   . Hypertension Brother   . Hyperlipidemia Brother      HOME MEDICATIONS: Allergies as of 01/30/2019      Reactions   Ilosone [erythromycin] Anaphylaxis   Keflex [cephalexin] Anaphylaxis   Penicillins Anaphylaxis   Has patient had a PCN reaction causing immediate rash, facial/tongue/throat swelling, SOB or lightheadedness with hypotension: YES Has patient had a PCN reaction causing severe rash involving mucus membranes or skin necrosis: NO Has patient had a PCN reaction that required hospitalizationNO Has patient had a PCN reaction occurring within the last 10 years: NO If all of the above answers are "NO", then may proceed with Cephalosporin use.   Iron Other (See Comments)   Pt reports condition limiting Iron intake.   Metformin And Related Other (See Comments)   Pt prefers to not take this medication   Morphine And Related Other (See Comments)   Pt reports hallucinations      Medication List       Accurate as of January 30, 2019 10:21 AM. If you have any questions, ask your nurse or doctor.        Accu-Chek FastClix Lancets Misc 1 Package by Does not apply route 3 (three) times daily.   aspirin 81 MG tablet Take 1 tablet (81 mg total) by mouth daily.   atorvastatin 80 MG tablet Commonly known as: LIPITOR Take 1 tablet (80 mg total) by mouth daily.   butalbital-acetaminophen-caffeine 50-325-40 MG tablet Commonly known as: FIORICET  Take 1-2 tablets by mouth every 6 (six) hours as needed for headache or migraine.   Contour Next One Kit 1 kit by Does not apply route as directed.   EPINEPHrine 0.3 mg/0.3 mL Soaj injection Commonly known as: EPI-PEN Inject 0.3 mg into the muscle once as needed (for an anaphylactic reaction).   FLUoxetine 20 MG tablet Commonly known as: PROZAC Take 20 mg by mouth every morning.   furosemide 20 MG tablet Commonly known  as: LASIX Take 1 tablet (20 mg total) by mouth 2 (two) times daily.   Glucagon Emergency 1 MG injection Generic drug: glucagon Inject 1 mg into the vein once as needed (FOR ONSET OF HYPOGLYCEMIA).   glucose blood test strip Commonly known as: Contour Next Test Use as instructed to test blood sugars 2-3 times daily   insulin pump Soln Inject into the skin continuous. Humalog   lamoTRIgine 150 MG tablet Commonly known as: LAMICTAL Take 150 mg by mouth daily.   Latuda 60 MG Tabs Generic drug: Lurasidone HCl Take 60 mg by mouth daily.   levothyroxine 150 MCG tablet Commonly known as: SYNTHROID Take 150 mcg by mouth daily.   losartan 100 MG tablet Commonly known as: COZAAR Take 1 tablet (100 mg total) by mouth daily.   metoprolol tartrate 50 MG tablet Commonly known as: LOPRESSOR Take 1 tablet (50 mg total) by mouth 2 (two) times daily.   multivitamin with minerals Tabs tablet Take 1 tablet by mouth daily.   nitroGLYCERIN 0.4 MG SL tablet Commonly known as: NITROSTAT Place 1 tablet (0.4 mg total) under the tongue every 5 (five) minutes x 3 doses as needed for chest pain.   Ozempic (0.25 or 0.5 MG/DOSE) 2 MG/1.5ML Sopn Generic drug: Semaglutide(0.25 or 0.5MG/DOS) Inject 0.25 mg into the skin once a week.   pantoprazole 40 MG tablet Commonly known as: PROTONIX Take 1 tablet (40 mg total) by mouth 2 (two) times daily before a meal. X 8 weeks   potassium chloride 10 MEQ tablet Commonly known as: K-DUR Take 1 tablet (10 mEq total) by mouth 2  (two) times daily.   promethazine 12.5 MG tablet Commonly known as: PHENERGAN Take 1 tablet (12.5 mg total) by mouth every 6 (six) hours as needed for nausea or vomiting.   ticagrelor 90 MG Tabs tablet Commonly known as: BRILINTA Take 1 tablet (90 mg total) by mouth 2 (two) times daily.   traMADol 50 MG tablet Commonly known as: ULTRAM Take 0.5 tablets (25 mg total) by mouth every 12 (twelve) hours as needed for moderate pain.   Vitamin D3 50 MCG (2000 UT) Tabs Take 2,000-4,000 Units by mouth See admin instructions. Take 4,000 units by mouth once a day on Mon/Wed/Fri and 2,000 units on Sun/Tues/Thurs/Sat         DATA REVIEWED:  Lab Results  Component Value Date   HGBA1C 10.4 (H) 07/26/2018   HGBA1C 9.8 (H) 06/27/2018   HGBA1C 13.2 (H) 05/15/2016   Lab Results  Component Value Date   CREATININE 1.44 (H) 01/02/2019     ASSESSMENT / PLAN / RECOMMENDATIONS:   1) Type 2 Diabetes Mellitus, Poorly controlled, With macrovascular complications - Most recent A1c of 10.4 %. Goal A1c < 7.0 %.   Plan:  - Unfortunately she continues with hyperglycemia, she continues with improperly using her pump, she depends on the basal insulin, doesn't always enter her carbohydrates .  - For example yesterday she didn't enter any CHO all day and her BG's were in 300's.  - we did discuss a few examples where her BG went to 136 and 180 mg/dL with proper use of the pump and CHO counting.  - I again explained to her that the pump settings are proper, the problem is she doesn't;t use her pump correctly hence the hyperglycemia.  - She is doing to see Morgan [name] today for pump education    MEDICATIONS:  Continue  Ozempic 0.5 mg weekly  Pump  METRONIC     Insulin type   Humalog     Basal rate       0000-0730  2.4 u/h    0730-0900  2.4 u/h    0900-0000  2.4 u/h       I:C ratio       0000  4.0                  Sensitivity       0000  20      Goal        0800-0000 120   0000 -0800 130    EDUCATION / INSTRUCTIONS:  BG monitoring instructions: Patient is instructed to check her blood sugars 4 times a day, before meals and bedtime.  Call [facility] if: BG persistently < 70 or > 300. . I reviewed the Rule of 15 for the treatment of hypoglycemia in detail with the patient. Literature supplied.     F/U in 8 weeks    Signed electronically by: Mack Guise, MD  [facility] Endocr[facility]  [facility] [address] [place], [place] [address] Phone: [phone] FAX: [phone]   CC: [address] [facility] [place] [address] Phone: [phone]  Fax: [phone]  Return to Endocrinology clinic as below: Future Appointments  Date Time Provider [place]  01/30/2019  1:20 PM Kimberlynn Lumbra, Melanie Crazier, MD [facility] None  01/30/2019  1:45 PM Ocie Doyne, RN [facility]  04/03/2019  9:15 AM [facility] ECHO 3 [id number]  04/03/2019 11:00 AM Josue Hector, MD [facility]

## 2019-01-31 NOTE — Progress Notes (Signed)
Patient reports she is having bent canulas, which are causing her readings to go very high.  See previous note.  She was given a sample of the Sure T infusion set (needle infusion) and shown how to use this.  She put a set on and had no questions.  It was stressed that she does not need to fill a cannula anymore,and she reported good understanding of this  She had no final questions.

## 2019-01-31 NOTE — Telephone Encounter (Signed)
Opened in error

## 2019-01-31 NOTE — Patient Instructions (Signed)
Read over directions for how to insert and use the Sure T infusion set.  Call [employer] to exchange any unopened boxes for the Sure T, if you link them. Do not "fill canula".

## 2019-01-31 NOTE — Progress Notes (Signed)
Problems identified.  1.  Pt. Does not know how to change settings 2.  Not bolusing for all snacks and meals. 3.  Not doing a correction bolus at HS, or during the day. 4.  Not always putting in carb amounts/using units of insulin.    Remedies 1.  Discussed/reviewed how to change settings.  Sheet given with directions for this.  Pt. Did setting change of basal rate correctly, by following the directions on the sheet. 2.  Discussed the need to bolus for all food entering her mount.  Discussed how insulin resistance develops when blood sugars go high--requiring more insulin and more weight gain.  She reported good understanding of this. 3.Discussed the importance of doing a corrections dose at HS, to be able to bring blood sugars down and having better blood sugars for 8 hours while sleeping. 4.  Discussed the need to put carbs in, and not units of insulin, because this allows for the use of IOB and corrections doses for high readings.  She reported good understanding of this.  She promised to do all of the above, and had no final questions.

## 2019-01-31 NOTE — Patient Instructions (Signed)
Review how to change pump settings in manual Bolus for all food entering your mouth, except that which is treating a low blood sugar Do a correction dose at bedtime EVERY NIGHT! Put all carbs in at every meal.

## 2019-02-01 ENCOUNTER — Telehealth: Payer: Self-pay | Admitting: Internal Medicine

## 2019-02-01 NOTE — Telephone Encounter (Signed)
Patient stating that she is vomiting each morning since starting Ozempic on 01/30/2019. Wants to know if she should continue medication or how to proceed?  Call back number is [phone]

## 2019-02-05 NOTE — Telephone Encounter (Signed)
Please advise 

## 2019-02-05 NOTE — Telephone Encounter (Signed)
Attempted to call pt to inform her of this but her vm asked for a remote access code. Will try again later.

## 2019-02-05 NOTE — Telephone Encounter (Signed)
Pt aware of instructions

## 2019-02-13 ENCOUNTER — Ambulatory Visit: Payer: Self-pay | Admitting: *Deleted

## 2019-02-13 DIAGNOSIS — Z20822 Contact with and (suspected) exposure to covid-19: Secondary | ICD-10-CM

## 2019-02-13 NOTE — Telephone Encounter (Signed)
Patient is calling to report she did a tele-visit with an on line provider: [facility] [place], [place]  This provider has ordered testing for patient due to symptoms.  Ordering provider: Joselyn Glassman NPI [id number] Phone: [phone] Fax: [phone] ( please send results)        Reason for Disposition . Health Information question, no triage required and triager able to answer question  Protocols used: INFORMATION ONLY CALL-A-AH

## 2019-02-14 ENCOUNTER — Other Ambulatory Visit: Payer: 59

## 2019-02-14 DIAGNOSIS — Z20822 Contact with and (suspected) exposure to covid-19: Secondary | ICD-10-CM

## 2019-02-14 LAB — NOVEL CORONAVIRUS, NAA: SARS-CoV-2, NAA: NOT DETECTED

## 2019-02-16 ENCOUNTER — Encounter: Payer: Self-pay | Admitting: Hematology

## 2019-02-19 ENCOUNTER — Telehealth: Payer: Self-pay

## 2019-02-19 NOTE — Telephone Encounter (Signed)
**Note De-Identified  Obfuscation** We received a non-formulary PA request for the pts Brilinta from [facility]. Per the pts chart her insurance has been covering her Brilinta so I called [facility] and s/w Tye [place] to get a better understanding of need for this PA.  Per Tye [place] they have a Brilinta refill ready for the pt to pick up at this time and that the cost to the pt is ZERO. She does not know why we received this fax but states a Brilinta PA is not needed at this time.

## 2019-02-23 ENCOUNTER — Telehealth: Payer: Self-pay

## 2019-02-23 NOTE — Telephone Encounter (Signed)
[place] healthcare approved pt for sensor/device procedure code [phone] from 02/01/2019-02/01/2020

## 2019-02-27 ENCOUNTER — Other Ambulatory Visit: Payer: Self-pay

## 2019-02-27 ENCOUNTER — Encounter: Payer: Self-pay | Admitting: Family Medicine

## 2019-02-27 ENCOUNTER — Ambulatory Visit (INDEPENDENT_AMBULATORY_CARE_PROVIDER_SITE_OTHER): Payer: 59 | Admitting: Family Medicine

## 2019-02-27 VITALS — BP 124/70 | HR 71 | Ht 63.0 in | Wt 242.0 lb

## 2019-02-27 DIAGNOSIS — Z794 Long term (current) use of insulin: Secondary | ICD-10-CM

## 2019-02-27 DIAGNOSIS — L304 Erythema intertrigo: Secondary | ICD-10-CM

## 2019-02-27 DIAGNOSIS — E1165 Type 2 diabetes mellitus with hyperglycemia: Secondary | ICD-10-CM

## 2019-02-27 MED ORDER — NYSTATIN 100000 UNIT/GM EX POWD: CUTANEOUS | 0 refills | Status: DC | PRN

## 2019-02-27 MED ORDER — NYSTATIN 100000 UNIT/GM EX OINT: 1.0000 "application " | TOPICAL_OINTMENT | Freq: Two times a day (BID) | CUTANEOUS | 0 refills | Status: DC

## 2019-02-27 NOTE — Patient Instructions (Signed)
Thank you for coming to see me today. It was a pleasure to meet you! Today we talked about:   The rash on her belly I have sent an antifungal powder as well as cream for you to place on the area.  Will be important for you to keep this area as clean and dry as possible.  Please schedule follow-up as needed.  I have placed a referral for you to see an eye doctor in order to evaluate for your annual diabetic screening.  We will release your results on my chart from the labs that we performed today.  We will also plan to follow-up on your screening lung CT in November 2020.  Please follow-up with me in 3 months or sooner as needed.  If you have any questions or concerns, please do not hesitate to call the office at [phone].  Take Care,   [place] Kendricks Reap, DO  Skin Yeast Infection  A skin yeast infection is a condition in which there is an overgrowth of yeast (candida) that normally lives on the skin. This condition usually occurs in areas of the skin that are constantly warm and moist, such as the armpits or the groin. What are the causes? This condition is caused by a change in the normal balance of the yeast and bacteria that live on the skin. What increases the risk? You are more likely to develop this condition if you:  Are obese.  Are pregnant.  Take birth control pills.  Have diabetes.  Take antibiotic medicines.  Take steroid medicines.  Are malnourished.  Have a weak body defense system (immune system).  Are 33 years of age or older.  Wear tight clothing. What are the signs or symptoms? The most common symptom of this condition is itchiness in the affected area. Other symptoms include:  Red, swollen area of the skin.  Bumps on the skin. How is this diagnosed?  This condition is diagnosed with a medical history and physical exam.  Your health care provider may check for yeast by taking light scrapings of the skin to be viewed under a microscope. How is  this treated? This condition is treated with medicine. Medicines may be prescribed or be available over the counter. The medicines may be:  Taken by mouth (orally).  Applied as a cream or powder to your skin. Follow these instructions at home:   Take or apply over-the-counter and prescription medicines only as told by your health care provider.  Maintain a healthy weight. If you need help losing weight, talk with your health care provider.  Keep your skin clean and dry.  If you have diabetes, keep your blood sugar under control.  Keep all follow-up visits as told by your health care provider. This is important. Contact a health care provider if:  Your symptoms go away and then return.  Your symptoms do not get better with treatment.  Your symptoms get worse.  Your rash spreads.  You have a fever or chills.  You have new symptoms.  You have new warmth or redness of your skin. Summary  A skin yeast infection is a condition in which there is an overgrowth of yeast (candida) that normally lives on the skin. This condition is caused by a change in the normal balance of the yeast and bacteria that live on the skin.  Take or apply over-the-counter and prescription medicines only as told by your health care provider.  Keep your skin clean and dry.  Contact a  health care provider if your symptoms do not get better with treatment. This information is not intended to replace advice given to you by your health care provider. Make sure you discuss any questions you have with your health care provider. Document Released: 04/06/2011 Document Revised: 12/06/2017 Document Reviewed: 12/06/2017 Elsevier Patient Education  2020 [employer].

## 2019-02-27 NOTE — Progress Notes (Signed)
Subjective:    Patient ID: Morgan [name], female    DOB: [birth year], 53 y.o.   MRN: [id number]   CC: Establish care  HPI:  Rash Patient reports that she has had some small and redness to where her C-section scar was from 1998.  She is unsure what this is but is worried that she may have an infection.  She states that she has not had any fevers, chills.  States that it gets worse occasionally.  Right now she is going through 1 of the worst players.  Denies any new medications  Establish care.  Patient reports that she previously had a PCP in [place] but she is trying to get all of her doctors into one area that is closer to her house. -Last mammogram was 1 year ago. Patient previously with hysterectomy Patient has an eye doctor in the area. Had a CT lung performed which recommended follow-up in 6 to 12 months.  Patient would like to have this done around November 2020 for her insurance purposes Reports that she had a colonoscopy 4 years ago and she believes follow-up is 5 years however on chart review states that it should be in 10 years.   Review of Systems  Constitutional: Negative.   HENT: Negative.   Eyes: Negative.   Respiratory: Positive for cough.   Cardiovascular: Negative.   Gastrointestinal: Positive for diarrhea. Negative for abdominal pain, constipation, nausea and vomiting.  Genitourinary:       Some urinary incontinence  Musculoskeletal: Positive for myalgias.  Skin: Positive for rash.  Neurological: Positive for headaches. Negative for weakness.  Psychiatric/Behavioral: Negative.    Patient Active Problem List   Diagnosis Date Noted  . Intertrigo 03/05/2019  . Type 2 diabetes mellitus with hyperglycemia, with long-term current use of insulin ([place]) 11/17/2018  . S/P coronary artery stent placement   . Acute systolic heart failure ([place])   . Hyperlipidemia LDL goal <70   . Intractable nausea and vomiting 10/26/2018  . Esophagitis determined by endoscopy    . Dysphagia 07/24/2018  . Odynophagia 07/23/2018  . Orthostatic hypotension 07/22/2018  . Tachycardia 07/22/2018  . Hepatic steatosis   . Elevated transaminase level 07/21/2018  . Mild renal insufficiency 07/21/2018  . Leukocytosis 07/21/2018  . Bipolar depression ([place]) 07/21/2018  . Incidental lung nodule 07/21/2018  . Extravasation accident, initial encounter 07/21/2018  . Prolonged QT interval 07/21/2018  . LFTs abnormal   . Hyperlipidemia 05/14/2016  . Hypothyroidism 05/14/2016  . Essential hypertension 05/14/2016     Family History  Problem Relation Age of Onset  . Diabetes Mother   . Hypertension Mother   . Hyperlipidemia Mother   . CAD Mother   . Cervical cancer Mother   . CAD Father   . Heart failure Father   . Testicular cancer Father   . Hypertension Sister   . Hyperlipidemia Sister   . Hypertension Brother   . Hyperlipidemia Brother     Past Medical History:  Diagnosis Date  . Anxiety   . Asthma   . Bipolar depression ([place])   . Chronic bronchitis ([place])   . Chronic esophagogastric ulcer   . Chronic stomach ulcer   . Fibroid   . GERD (gastroesophageal reflux disease)   . Heart murmur   . Hepatic steatosis   . History of blood transfusion 2010   "related to subclavian stent"  . History of hiatal hernia   . Hyperlipidemia   . Hypertension   . Hypothyroid   .  Migraine    "cerebral migraines; 1-2/month" (06/29/2018)  . Non-ST elevation (NSTEMI) myocardial infarction ([place]) 10/27/2018  . 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 Stroke [facility])    "I've had 2; most recent one was ~ 2015 or before; affected balance" (06/29/2018)  . Thalassemia minor   . Type II diabetes mellitus ([place])   . Vitamin D deficiency     Social Hx: Denies tobacco use, alcohol use, or illicit drug use.  Reports that she did acid as a kid as well as marijuana  Reports that she stopped smoking in January 2015.  She started as a teen and smoked 2 to 3 packs/day.  She reports that  she still receives secondhand smoke from her husband.  Objective:  BP 124/70   Pulse 71   Ht 5\' 3"  (1.6 m)   Wt 242 lb (109.8 kg)   LMP 08/03/2013   SpO2 97%   BMI 42.87 kg/m  Vitals and nursing note reviewed  General: NAD, pleasant Head: Atraumatic Neck: Supple Cardiac: RRR, normal heart sounds, no murmurs Respiratory: CTAB, normal effort Abdomen: soft, nontender, nondistended. Bowel sounds present; candidal rash present under pannus Extremities: no edema or cyanosis. WWP. MSK: normal gait Skin: warm and dry, no rashes noted Neuro: alert and oriented, no focal deficits Psych: Neatly groomed and appropriately dressed. Maintains good eye contact and is cooperative and attentive. Speech is normal volume and rate. Denies SI/ HI. Normal affect.  Assessment & Plan:    Intertrigo Patient encouraged to keep the area as clean and dry as possible.  She was given nystatin powder to use when she is going outside or at night.  She was also given nystatin cream to try.  If this does not work then we will try oral medications as needed.   [place] Urijah Arko, DO Family Medicine Resident, PGY-3

## 2019-02-28 LAB — COMPREHENSIVE METABOLIC PANEL
ALT: 21 IU/L (ref 0–32)
AST: 31 IU/L (ref 0–40)
Albumin/Globulin Ratio: 1.7 (ref 1.2–2.2)
Albumin: 3.9 g/dL (ref 3.8–4.9)
Alkaline Phosphatase: 78 IU/L (ref 39–117)
BUN/Creatinine Ratio: 13 (ref 9–23)
BUN: 9 mg/dL (ref 6–24)
Bilirubin Total: 0.6 mg/dL (ref 0.0–1.2)
CO2: 25 mmol/L (ref 20–29)
Calcium: 8.9 mg/dL (ref 8.7–10.2)
Chloride: 100 mmol/L (ref 96–106)
Creatinine, Ser: 0.67 mg/dL (ref 0.57–1.00)
GFR calc Af Amer: 115 mL/min/{1.73_m2} (ref >59)
GFR calc non Af Amer: 100 mL/min/{1.73_m2} (ref >59)
Globulin, Total: 2.3 g/dL (ref 1.5–4.5)
Glucose: 198 mg/dL — ABNORMAL HIGH (ref 65–99)
Potassium: 4.1 mmol/L (ref 3.5–5.2)
Sodium: 141 mmol/L (ref 134–144)
Total Protein: 6.2 g/dL (ref 6.0–8.5)

## 2019-02-28 LAB — CBC
Hematocrit: 35 % (ref 34.0–46.6)
Hemoglobin: 11.4 g/dL (ref 11.1–15.9)
MCH: 21.3 pg — ABNORMAL LOW (ref 26.6–33.0)
MCHC: 32.6 g/dL (ref 31.5–35.7)
MCV: 66 fL — ABNORMAL LOW (ref 79–97)
Platelets: 364 10*3/uL (ref 150–450)
RBC: 5.34 x10E6/uL — ABNORMAL HIGH (ref 3.77–5.28)
RDW: 15.7 % — ABNORMAL HIGH (ref 11.7–15.4)
WBC: 11.6 10*3/uL — ABNORMAL HIGH (ref 3.4–10.8)

## 2019-02-28 LAB — HEPATITIS C ANTIBODY: Hep C Virus Ab: 0.1 s/co ratio (ref 0.0–0.9)

## 2019-03-05 DIAGNOSIS — L304 Erythema intertrigo: Secondary | ICD-10-CM | POA: Insufficient documentation

## 2019-03-05 NOTE — Assessment & Plan Note (Signed)
Patient encouraged to keep the area as clean and dry as possible.  She was given nystatin powder to use when she is going outside or at night.  She was also given nystatin cream to try.  If this does not work then we will try oral medications as needed.

## 2019-03-07 ENCOUNTER — Encounter: Payer: Self-pay | Admitting: Internal Medicine

## 2019-03-07 ENCOUNTER — Encounter: Payer: Self-pay | Admitting: Family Medicine

## 2019-03-14 ENCOUNTER — Emergency Department (HOSPITAL_COMMUNITY): Payer: 59

## 2019-03-14 ENCOUNTER — Emergency Department (HOSPITAL_COMMUNITY)
Admission: EM | Admit: 2019-03-14 | Discharge: 2019-03-14 | Disposition: A | Discharge status: Home / Self Care | Payer: 59 | Attending: Emergency Medicine | Admitting: Emergency Medicine

## 2019-03-14 ENCOUNTER — Other Ambulatory Visit: Payer: Self-pay

## 2019-03-14 DIAGNOSIS — S92515A Nondisplaced fracture of proximal phalanx of left lesser toe(s), initial encounter for closed fracture: Secondary | ICD-10-CM | POA: Diagnosis not present

## 2019-03-14 DIAGNOSIS — Y93E1 Activity, personal bathing and showering: Secondary | ICD-10-CM | POA: Diagnosis not present

## 2019-03-14 DIAGNOSIS — J45909 Unspecified asthma, uncomplicated: Secondary | ICD-10-CM | POA: Insufficient documentation

## 2019-03-14 DIAGNOSIS — Z955 Presence of coronary angioplasty implant and graft: Secondary | ICD-10-CM | POA: Insufficient documentation

## 2019-03-14 DIAGNOSIS — S82832A Other fracture of upper and lower end of left fibula, initial encounter for closed fracture: Secondary | ICD-10-CM | POA: Diagnosis not present

## 2019-03-14 DIAGNOSIS — I10 Essential (primary) hypertension: Secondary | ICD-10-CM | POA: Insufficient documentation

## 2019-03-14 DIAGNOSIS — E119 Type 2 diabetes mellitus without complications: Secondary | ICD-10-CM | POA: Insufficient documentation

## 2019-03-14 DIAGNOSIS — E039 Hypothyroidism, unspecified: Secondary | ICD-10-CM | POA: Diagnosis not present

## 2019-03-14 DIAGNOSIS — Z7982 Long term (current) use of aspirin: Secondary | ICD-10-CM | POA: Diagnosis not present

## 2019-03-14 DIAGNOSIS — Z8673 Personal history of transient ischemic attack (TIA), and cerebral infarction without residual deficits: Secondary | ICD-10-CM | POA: Diagnosis not present

## 2019-03-14 DIAGNOSIS — W182XXA Fall in (into) shower or empty bathtub, initial encounter: Secondary | ICD-10-CM | POA: Insufficient documentation

## 2019-03-14 DIAGNOSIS — Z87891 Personal history of nicotine dependence: Secondary | ICD-10-CM | POA: Diagnosis not present

## 2019-03-14 DIAGNOSIS — Z794 Long term (current) use of insulin: Secondary | ICD-10-CM | POA: Diagnosis not present

## 2019-03-14 DIAGNOSIS — Y999 Unspecified external cause status: Secondary | ICD-10-CM | POA: Diagnosis not present

## 2019-03-14 DIAGNOSIS — S99912A Unspecified injury of left ankle, initial encounter: Secondary | ICD-10-CM | POA: Diagnosis present

## 2019-03-14 DIAGNOSIS — Y92002 Bathroom of unspecified non-institutional (private) residence single-family (private) house as the place of occurrence of the external cause: Secondary | ICD-10-CM | POA: Insufficient documentation

## 2019-03-14 DIAGNOSIS — Z79899 Other long term (current) drug therapy: Secondary | ICD-10-CM | POA: Diagnosis not present

## 2019-03-14 DIAGNOSIS — I252 Old myocardial infarction: Secondary | ICD-10-CM | POA: Diagnosis not present

## 2019-03-14 MED ORDER — HYDROCODONE-ACETAMINOPHEN 5-325 MG PO TABS: 1.0000 | ORAL_TABLET | ORAL | 0 refills | Status: DC | PRN

## 2019-03-14 MED ORDER — OXYCODONE-ACETAMINOPHEN 5-325 MG PO TABS
1.0000 | ORAL_TABLET | Freq: Once | ORAL | Status: AC
  Administered 2019-03-14: 1 via ORAL
  Filled 2019-03-14: qty 1

## 2019-03-14 NOTE — ED Notes (Signed)
Ortho has been paged & did contact this RN back for the boot ordered for the pts LLE.

## 2019-03-14 NOTE — ED Notes (Signed)
Ortho tech at bedside 

## 2019-03-14 NOTE — ED Notes (Addendum)
EDP [place] at bedside 

## 2019-03-14 NOTE — Progress Notes (Signed)
Orthopedic Tech Progress Note Patient Details:  Morgan [name] 07-14-1966 [id number]  Ortho Devices Type of Ortho Device: CAM walker, Crutches Ortho Device/Splint Location: LLE Ortho Device/Splint Interventions: Adjustment, Application, Ordered   Post Interventions Patient Tolerated: Ambulated well, Well Instructions Provided: Poper ambulation with device, Care of device, Adjustment of device   Janit Pagan 03/14/2019, 8:43 AM

## 2019-03-14 NOTE — ED Triage Notes (Signed)
Pt. Came in POV after a fall in the shower about an hour prior to arrival. Pt stated, she sprained her left ankle Thursday and when she fell today in the shower she fell on her ankle. Pt. Does not think she lost consciousness. Slight Headache today.

## 2019-03-14 NOTE — ED Provider Notes (Signed)
Care transferred to me.  Patient has diffuse pain and swelling to her ankle and foot.  Hard to localize if the fifth proximal phalanx is indeed fractured.  Either way with a distal fibula fracture which is mildly displaced, I think CAM Walker treatment and cane versus crutches depending on patient preference is okay.  She will be treated with short course of hydrocodone at home as well.  I will refer her to orthopedics.    Dg Ankle Complete Left  Result Date: 03/14/2019 CLINICAL DATA:  Status post fall with left ankle pain. EXAM: LEFT ANKLE COMPLETE - 3+ VIEW COMPARISON:  None. FINDINGS: There is displaced fracture of distal fibula with surrounding soft tissue swelling. There is no dislocation. Minimal plantar calcaneal spur is identified. IMPRESSION: Fracture of distal fibula. Electronically Signed   By: Abelardo Diesel M.D.   On: 03/14/2019 07:31   Dg Foot 2 Views Left  Result Date: 03/14/2019 CLINICAL DATA:  Status post fall with left foot pain. EXAM: LEFT FOOT - 2 VIEW COMPARISON:  None. FINDINGS: There is mild deformity of the proximal aspect of the fifth proximal phalanx. The patient has focal pain here, fracture is not excluded. Plantar calcaneal spur is identified. Fracture of distal fibula is not as well seen on these films. IMPRESSION: Mild deformity of the proximal aspect of the fifth proximal phalanx. If the patient has focal pain here, fracture is not excluded. Electronically Signed   By: Abelardo Diesel M.D.   On: 03/14/2019 07:33      Sherwood Gambler, MD 03/14/19 0800

## 2019-03-14 NOTE — ED Provider Notes (Signed)
[place] EMERGENCY DEPARTMENT Provider Note   CSN: [id number] Arrival date & time: 03/14/19  1027    History   Chief Complaint Chief Complaint  Patient presents with  . Fall    Ankle    HPI Morgan [name] is a 53 y.o. female.     HPI  53 year old female with history of an STEMI, diabetes comes in with chief complaint of ankle injury. Patient reports that last Thursday she had injured/sprained her right ankle.  She was getting better, however 2 days she had another fall while in the shower.  She is now having pain in her left ankle/foot.  She denies injury or pain elsewhere.  Past Medical History:  Diagnosis Date  . Anxiety   . Asthma   . Bipolar depression ([place])   . Chronic bronchitis ([place])   . Chronic esophagogastric ulcer   . Chronic stomach ulcer   . Fibroid   . GERD (gastroesophageal reflux disease)   . Heart murmur   . Hepatic steatosis   . History of blood transfusion 2010   "related to subclavian stent"  . History of hiatal hernia   . Hyperlipidemia   . Hypertension   . Hypothyroid   . Migraine    "cerebral migraines; 1-2/month" (06/29/2018)  . Non-ST elevation (NSTEMI) myocardial infarction ([place]) 10/27/2018  . 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 Stroke [facility])    "I've had 2; most recent one was ~ 2015 or before; affected balance" (06/29/2018)  . Thalassemia minor   . Type II diabetes mellitus ([place])   . Vitamin D deficiency     Patient Active Problem List   Diagnosis Date Noted  . Intertrigo 03/05/2019  . Type 2 diabetes mellitus with hyperglycemia, with long-term current use of insulin ([place]) 11/17/2018  . S/P coronary artery stent placement   . Acute systolic heart failure ([place])   . Hyperlipidemia LDL goal <70   . Intractable nausea and vomiting 10/26/2018  . Esophagitis determined by endoscopy   . Dysphagia 07/24/2018  . Odynophagia 07/23/2018  . Orthostatic hypotension 07/22/2018  . Tachycardia 07/22/2018  .  Hepatic steatosis   . Elevated transaminase level 07/21/2018  . Mild renal insufficiency 07/21/2018  . Leukocytosis 07/21/2018  . Bipolar depression ([place]) 07/21/2018  . Incidental lung nodule 07/21/2018  . Extravasation accident, initial encounter 07/21/2018  . Prolonged QT interval 07/21/2018  . LFTs abnormal   . Hyperlipidemia 05/14/2016  . Hypothyroidism 05/14/2016  . Essential hypertension 05/14/2016    Past Surgical History:  Procedure Laterality Date  . ABDOMINAL HYSTERECTOMY  2015   "still have 1 ovary"  . BIOPSY  07/25/2018   Procedure: BIOPSY;  Surgeon: Thornton Park, MD;  Location: [facility] ENDOSCOPY;  Service: Gastroenterology;;  . [place]; 1996  . CORONARY STENT INTERVENTION N/A 10/27/2018   Procedure: CORONARY STENT INTERVENTION;  Surgeon: Sherren Mocha, MD;  Location: [place] CV LAB;  Service: Cardiovascular;  Laterality: N/A;  . ESOPHAGOGASTRODUODENOSCOPY (EGD) WITH PROPOFOL N/A 07/25/2018   Procedure: ESOPHAGOGASTRODUODENOSCOPY (EGD) WITH PROPOFOL;  Surgeon: Thornton Park, MD;  Location: [place];  Service: Gastroenterology;  Laterality: N/A;  . KNEE RECONSTRUCTION Left 1980  . LAPAROSCOPIC CHOLECYSTECTOMY    . LEFT HEART CATH AND CORONARY ANGIOGRAPHY N/A 10/27/2018   Procedure: LEFT HEART CATH AND CORONARY ANGIOGRAPHY;  Surgeon: Sherren Mocha, MD;  Location: [place] CV LAB;  Service: Cardiovascular;  Laterality: N/A;  . SUBCLAVIAN STENT PLACEMENT Left 2010  . TONSILLECTOMY  1970  OB History    Gravida  6   Para  2   Term  2   Preterm      AB  4   Living  2     SAB  4   TAB      Ectopic      Multiple      Live Births               Home Medications    Prior to Admission medications   Medication Sig Start Date End Date Taking? Authorizing Provider  ADVAIR HFA 115-21 MCG/ACT inhaler Inhale 1 puff into the lungs 2 (two) times daily. 03/05/19  Yes [provider]  aspirin 81 MG tablet Take 1 tablet  (81 mg total) by mouth daily. 10/31/18  Yes Regalado, Belkys A, MD  atorvastatin (LIPITOR) 80 MG tablet Take 1 tablet (80 mg total) by mouth daily. 12/04/18  Yes Josue Hector, MD  butalbital-acetaminophen-caffeine (FIORICET, ESGIC) 3372190155 MG tablet Take 1-2 tablets by mouth every 6 (six) hours as needed for headache or migraine. 07/26/18 07/26/19 Yes Rai, Ripudeep K, MD  Cholecalciferol (VITAMIN D3) 50 MCG (2000 UT) TABS Take 2,000-4,000 Units by mouth See admin instructions. Take 2 tablets by mouth once a day on Mon/Wed/Fri and take 1 tablet on Sun/Tues/Thurs/Sat   Yes [provider]  EPINEPHrine 0.3 mg/0.3 mL IJ SOAJ injection Inject 0.3 mg into the muscle once as needed (for an anaphylactic reaction).  02/08/18  Yes [provider]  FLUoxetine (PROZAC) 20 MG tablet Take 20 mg by mouth daily.    Yes [provider]  furosemide (LASIX) 20 MG tablet Take 1 tablet (20 mg total) by mouth 2 (two) times daily. 12/11/18  Yes Josue Hector, MD  GLUCAGON EMERGENCY 1 MG injection Inject 1 mg into the vein once as needed (FOR ONSET OF HYPOGLYCEMIA).  02/09/18  Yes [provider]  Insulin Human (INSULIN PUMP) SOLN Inject into the skin continuous. Humalog   Yes [provider]  lamoTRIgine (LAMICTAL) 150 MG tablet Take 150 mg by mouth daily. 11/29/18  Yes [provider]  levothyroxine (SYNTHROID, LEVOTHROID) 150 MCG tablet Take 150 mcg by mouth daily.    Yes [provider]  losartan (COZAAR) 100 MG tablet Take 1 tablet (100 mg total) by mouth daily. 11/16/18  Yes Josue Hector, MD  Lurasidone HCl (LATUDA) 60 MG TABS Take 60 mg by mouth daily.   Yes [provider]  metoprolol tartrate (LOPRESSOR) 50 MG tablet Take 1 tablet (50 mg total) by mouth 2 (two) times daily. 12/04/18  Yes Josue Hector, MD  Multiple Vitamin (MULTIVITAMIN WITH MINERALS) TABS tablet Take 1 tablet by mouth daily.   Yes [provider]  nitroGLYCERIN  (NITROSTAT) 0.4 MG SL tablet Place 1 tablet (0.4 mg total) under the tongue every 5 (five) minutes x 3 doses as needed for chest pain. 07/26/18  Yes Rai, Ripudeep K, MD  nystatin ointment (MYCOSTATIN) Apply 1 application topically 2 (two) times daily. 02/27/19  Yes Enid Derry, [place], DO  pantoprazole (PROTONIX) 40 MG tablet Take 1 tablet (40 mg total) by mouth 2 (two) times daily before a meal. X 8 weeks 07/26/18  Yes Rai, Ripudeep K, MD  potassium chloride (K-DUR) 10 MEQ tablet Take 1 tablet (10 mEq total) by mouth 2 (two) times daily. 12/04/18  Yes Josue Hector, MD  ticagrelor (BRILINTA) 90 MG TABS tablet Take 1 tablet (90 mg total) by mouth 2 (two)  times daily. 11/20/18  Yes Josue Hector, MD  traMADol (ULTRAM) 50 MG tablet Take 0.5 tablets (25 mg total) by mouth every 12 (twelve) hours as needed for moderate pain. 10/31/18  Yes Regalado, Belkys A, MD  VENTOLIN HFA 108 (90 Base) MCG/ACT inhaler Inhale 2 puffs into the lungs 2 (two) times daily. 03/05/19  Yes [provider]  Accu-Chek FastClix Lancets MISC 1 Package by Does not apply route 3 (three) times daily. 11/17/18   Shamleffer, Melanie Crazier, MD  Blood Glucose Monitoring Suppl (CONTOUR NEXT ONE) KIT 1 kit by Does not apply route as directed. 07/02/18   Patrecia Pour, MD  glucose blood (CONTOUR NEXT TEST) test strip Use as instructed 01/30/19   Shamleffer, Melanie Crazier, MD  HYDROcodone-acetaminophen (NORCO) 5-325 MG tablet Take 1 tablet by mouth every 4 (four) hours as needed for severe pain. 03/14/19   Sherwood Gambler, MD  nystatin (MYCOSTATIN/NYSTOP) powder Apply topically as needed. Patient not taking: Reported on 03/14/2019 02/27/19   Shirley, [place], DO  Semaglutide,0.25 or 0.5MG/DOS, (OZEMPIC, 0.25 OR 0.5 MG/DOSE,) 2 MG/1.5ML SOPN Inject 0.25 mg into the skin once a week. Patient not taking: Reported on 03/14/2019 01/22/19   Shamleffer, Melanie Crazier, MD    Family History Family History  Problem Relation Age of Onset  .  Diabetes Mother   . Hypertension Mother   . Hyperlipidemia Mother   . CAD Mother   . Cervical cancer Mother   . CAD Father   . Heart failure Father   . Testicular cancer Father   . Hypertension Sister   . Hyperlipidemia Sister   . Hypertension Brother   . Hyperlipidemia Brother     Social History Social History   Tobacco Use  . Smoking status: Former Smoker    Packs/day: 2.00    Years: 36.00    Pack years: 72.00    Types: Cigarettes    Quit date: 05/14/2014    Years since quitting: 4.8  . Smokeless tobacco: Never Used  Substance Use Topics  . Alcohol use: Not Currently  . Drug use: Not Currently     Allergies   Ilosone [erythromycin], Keflex [cephalexin], Penicillins, Iron, Metformin and related, and Morphine and related   Review of Systems Review of Systems  Constitutional: Positive for activity change.  Respiratory: Negative for shortness of breath.   Cardiovascular: Negative for chest pain.  Musculoskeletal: Positive for arthralgias.  Hematological: Does not bruise/bleed easily.     Physical Exam Updated Vital Signs BP 130/65   Pulse 82   Temp 99.4 F (37.4 C)   Resp 20   Ht '5\' 2"'  (1.575 m)   Wt 111.1 kg   LMP 08/03/2013   SpO2 97%   BMI 44.81 kg/m   Physical Exam Vitals signs and nursing note reviewed.  Constitutional:      Appearance: She is well-developed.  HENT:     Head: Atraumatic.  Neck:     Musculoskeletal: Normal range of motion and neck supple.  Cardiovascular:     Rate and Rhythm: Normal rate.  Pulmonary:     Effort: Pulmonary effort is normal.  Musculoskeletal:        General: Swelling and tenderness present.     Comments: Patient has edema of the left ankle.  She has tenderness both over the medial and lateral malleoli.  Patient also has distal foot tenderness. Palpable pulse appreciated over the dorsalis pedis  Skin:    General: Skin is warm and dry.  Neurological:  Mental Status: She is alert and oriented to person,  place, and time.      ED Treatments / Results  Labs (all labs ordered are listed, but only abnormal results are displayed) Labs Reviewed - No data to display  EKG None  Radiology Dg Ankle Complete Left  Result Date: 03/14/2019 CLINICAL DATA:  Status post fall with left ankle pain. EXAM: LEFT ANKLE COMPLETE - 3+ VIEW COMPARISON:  None. FINDINGS: There is displaced fracture of distal fibula with surrounding soft tissue swelling. There is no dislocation. Minimal plantar calcaneal spur is identified. IMPRESSION: Fracture of distal fibula. Electronically Signed   By: Abelardo Diesel M.D.   On: 03/14/2019 07:31   Dg Foot 2 Views Left  Result Date: 03/14/2019 CLINICAL DATA:  Status post fall with left foot pain. EXAM: LEFT FOOT - 2 VIEW COMPARISON:  None. FINDINGS: There is mild deformity of the proximal aspect of the fifth proximal phalanx. The patient has focal pain here, fracture is not excluded. Plantar calcaneal spur is identified. Fracture of distal fibula is not as well seen on these films. IMPRESSION: Mild deformity of the proximal aspect of the fifth proximal phalanx. If the patient has focal pain here, fracture is not excluded. Electronically Signed   By: Abelardo Diesel M.D.   On: 03/14/2019 07:33    Procedures Procedures (including critical care time)  Medications Ordered in ED Medications  oxyCODONE-acetaminophen (PERCOCET/ROXICET) 5-325 MG per tablet 1 tablet (1 tablet Oral Given 03/14/19 2683)     Initial Impression / Assessment and Plan / ED Course  I have reviewed the triage vital signs and the nursing notes.  Pertinent labs & imaging results that were available during my care of the patient were reviewed by me and considered in my medical decision making (see chart for details).        Patient comes in a chief complaint of fall.  She has visible ankles swelling and tenderness over the toes.  There is also mild discoloration over the toes likely due to bruising.  X-ray  ordered. Dr. Regenia Skeeter to f/u.   Final Clinical Impressions(s) / ED Diagnoses   Final diagnoses:  Closed fracture of distal end of left fibula, unspecified fracture morphology, initial encounter  Closed nondisplaced fracture of proximal phalanx of lesser toe of left foot, initial encounter    ED Discharge Orders         Ordered    HYDROcodone-acetaminophen (NORCO) 5-325 MG tablet  Every 4 hours PRN     03/14/19 0752           Varney Biles, MD 03/14/19 2316

## 2019-03-14 NOTE — ED Notes (Signed)
Pt has cane at bedside and would prefer to use that instead of crutches.

## 2019-03-19 ENCOUNTER — Ambulatory Visit: Payer: 59 | Admitting: Family Medicine

## 2019-03-21 NOTE — Progress Notes (Signed)
Evaluation Performed:  Follow-up visit  Date:  04/03/2019   ID:  Morgan [name], DOB [birth year], MRN [id number]  Provider Location: Office  PCP:  Shirley, [place], DO  Cardiologist:  Jenkins Rouge, MD   Electrophysiologist:  None   Chief Complaint:  CAD/DM/Dyspnea  History of Present Illness:    Morgan [name] is a 53 y.o. female history of CAD    First seen in hospital 10/31/18 Presented with intractable nausea and vomiting and DKA Noted to have elevated troponin max 1.0 Cath reviewed Dr Burt Knack 10/27/18 with severe LAD/D1 stenosis Rx with DES to both arteries no significant RCA/Circumflex disease LvEDP mildly elevated and had some post cath CHF Rx with Lasix. Echo 10/26/18 with EF 45-50% with RWMA in septal and apex consistent with her CAD.  D/c with lasix 20 mg , losartan 50 mg, lopressor 50 bid and Brilinta with ASA Had issues affording/ getting Brilinta   Her Bipolar disease is fairly new diagnosis end of last year. Had been Rx years of depression but things changed in December 2019 Now seeing psychologist/psychiatrist weekly  More edema partly due to sitting at computer all day. Lasix increased in May  BS still too high A1c 01/30/19 10.4   Broke her left ankle 03/14/19 slow to heal Seeing Dr Mardelle Matte Discussed not having surgery if possible as it has only been about 5 months since stent placed    The patient does not have symptoms concerning for COVID-19 infection (fever, chills, cough, or new shortness of breath).    Past Medical History:  Diagnosis Date  . Anxiety   . Asthma   . Bipolar depression ([place])   . Chronic bronchitis ([place])   . Chronic esophagogastric ulcer   . Chronic stomach ulcer   . Fibroid   . GERD (gastroesophageal reflux disease)   . Heart murmur   . Hepatic steatosis   . History of blood transfusion 2010   "related to subclavian stent"  . History of hiatal hernia   . Hyperlipidemia   . Hypertension   . Hypothyroid   . Migraine    "cerebral  migraines; 1-2/month" (06/29/2018)  . Non-ST elevation (NSTEMI) myocardial infarction ([place]) 10/27/2018  . 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 Stroke [facility])    "I've had 2; most recent one was ~ 2015 or before; affected balance" (06/29/2018)  . Thalassemia minor   . Type II diabetes mellitus ([place])   . Vitamin D deficiency    Past Surgical History:  Procedure Laterality Date  . ABDOMINAL HYSTERECTOMY  2015   "still have 1 ovary"  . BIOPSY  07/25/2018   Procedure: BIOPSY;  Surgeon: Thornton Park, MD;  Location: [facility] ENDOSCOPY;  Service: Gastroenterology;;  . [place]; 1996  . CORONARY STENT INTERVENTION N/A 10/27/2018   Procedure: CORONARY STENT INTERVENTION;  Surgeon: Sherren Mocha, MD;  Location: [place] CV LAB;  Service: Cardiovascular;  Laterality: N/A;  . ESOPHAGOGASTRODUODENOSCOPY (EGD) WITH PROPOFOL N/A 07/25/2018   Procedure: ESOPHAGOGASTRODUODENOSCOPY (EGD) WITH PROPOFOL;  Surgeon: Thornton Park, MD;  Location: [place];  Service: Gastroenterology;  Laterality: N/A;  . KNEE RECONSTRUCTION Left 1980  . LAPAROSCOPIC CHOLECYSTECTOMY    . LEFT HEART CATH AND CORONARY ANGIOGRAPHY N/A 10/27/2018   Procedure: LEFT HEART CATH AND CORONARY ANGIOGRAPHY;  Surgeon: Sherren Mocha, MD;  Location: [place] CV LAB;  Service: Cardiovascular;  Laterality: N/A;  . SUBCLAVIAN STENT PLACEMENT Left 2010  . TONSILLECTOMY  1970     Current Meds  Medication  Sig  . Accu-Chek FastClix Lancets MISC 1 Package by Does not apply route 3 (three) times daily.  [name] ADVAIR HFA 115-21 MCG/ACT inhaler Inhale 1 puff into the lungs 2 (two) times daily.  [name] aspirin 81 MG tablet Take 1 tablet (81 mg total) by mouth daily.  [name] atorvastatin (LIPITOR) 80 MG tablet Take 1 tablet (80 mg total) by mouth daily.  . Blood Glucose Monitoring Suppl (CONTOUR NEXT ONE) KIT 1 kit by Does not apply route as directed.  . butalbital-acetaminophen-caffeine (FIORICET, ESGIC) 50-325-40 MG tablet Take  1-2 tablets by mouth every 6 (six) hours as needed for headache or migraine.  . Cholecalciferol (VITAMIN D3) 50 MCG (2000 UT) TABS Take 2,000-4,000 Units by mouth See admin instructions. Take 2 tablets by mouth once a day on Mon/Wed/Fri and take 1 tablet on Sun/Tues/Thurs/Sat  . EPINEPHrine 0.3 mg/0.3 mL IJ SOAJ injection Inject 0.3 mg into the muscle once as needed (for an anaphylactic reaction).   [name] FLUoxetine (PROZAC) 20 MG tablet Take 20 mg by mouth daily.   . furosemide (LASIX) 20 MG tablet Take 1 tablet (20 mg total) by mouth 2 (two) times daily.  [name] GLUCAGON EMERGENCY 1 MG injection Inject 1 mg into the vein once as needed (FOR ONSET OF HYPOGLYCEMIA).   [name] glucose blood (CONTOUR NEXT TEST) test strip Use as instructed  . HYDROcodone-acetaminophen (NORCO) 5-325 MG tablet Take 1 tablet by mouth every 4 (four) hours as needed for severe pain.  . Insulin Human (INSULIN PUMP) SOLN Inject into the skin continuous. Humalog  . lamoTRIgine (LAMICTAL) 150 MG tablet Take 150 mg by mouth daily.  [name] levothyroxine (SYNTHROID, LEVOTHROID) 150 MCG tablet Take 150 mcg by mouth daily.   [name] losartan (COZAAR) 100 MG tablet Take 1 tablet (100 mg total) by mouth daily.  . Lurasidone HCl (LATUDA) 60 MG TABS Take 60 mg by mouth daily.  . metoprolol tartrate (LOPRESSOR) 50 MG tablet Take 1 tablet (50 mg total) by mouth 2 (two) times daily.  . Multiple Vitamin (MULTIVITAMIN WITH MINERALS) TABS tablet Take 1 tablet by mouth daily.  . nitroGLYCERIN (NITROSTAT) 0.4 MG SL tablet Place 1 tablet (0.4 mg total) under the tongue every 5 (five) minutes x 3 doses as needed for chest pain.  [name] nystatin (MYCOSTATIN/NYSTOP) powder Apply topically as needed.  . nystatin ointment (MYCOSTATIN) Apply 1 application topically 2 (two) times daily.  . pantoprazole (PROTONIX) 40 MG tablet Take 1 tablet (40 mg total) by mouth 2 (two) times daily before a meal. X 8 weeks  . potassium chloride (K-DUR) 10 MEQ tablet Take 1 tablet (10 mEq total)  by mouth 2 (two) times daily.  . Semaglutide,0.25 or 0.5MG/DOS, (OZEMPIC, 0.25 OR 0.5 MG/DOSE,) 2 MG/1.5ML SOPN Inject 0.25 mg into the skin once a week.  . ticagrelor (BRILINTA) 90 MG TABS tablet Take 1 tablet (90 mg total) by mouth 2 (two) times daily.  . traMADol (ULTRAM) 50 MG tablet Take 0.5 tablets (25 mg total) by mouth every 12 (twelve) hours as needed for moderate pain.  . VENTOLIN HFA 108 (90 Base) MCG/ACT inhaler Inhale 2 puffs into the lungs 2 (two) times daily.     Allergies:   Ilosone [erythromycin], Keflex [cephalexin], Penicillins, Iron, Metformin and related, and Morphine and related   Social History   Tobacco Use  . Smoking status: Former Smoker    Packs/day: 2.00    Years: 36.00    Pack years: 72.00    Types: Cigarettes    Quit  date: 05/14/2014    Years since quitting: 4.8  . Smokeless tobacco: Never Used  Substance Use Topics  . Alcohol use: Not Currently  . Drug use: Not Currently     Family Hx: The patient's family history includes CAD in her father and mother; Cervical cancer in her mother; Diabetes in her mother; Heart failure in her father; Hyperlipidemia in her brother, mother, and sister; Hypertension in her brother, mother, and sister; Testicular cancer in her father.  ROS:   Please see the history of present illness.     All other systems reviewed and are negative.   Prior CV studies:   The following studies were reviewed today: Echo 10/26/18 Cath 10/27/18   Labs/Other Tests and Data Reviewed:    EKG  10/30/18 SR rate 103 normal   Recent Labs: 07/26/2018: TSH 2.978 10/26/2018: Magnesium 2.2 02/27/2019: ALT 21; BUN 9; Creatinine, Ser 0.67; Hemoglobin 11.4; Platelets 364; Potassium 4.1; Sodium 141   Recent Lipid Panel Lab Results  Component Value Date/Time   CHOL 190 09/25/2018 03:05 PM   TRIG 245.0 (H) 09/25/2018 03:05 PM   HDL 37.50 (L) 09/25/2018 03:05 PM   CHOLHDL 5 09/25/2018 03:05 PM   LDLDIRECT 144.0 09/25/2018 03:05 PM    Wt  Readings from Last 3 Encounters:  04/03/19 237 lb (107.5 kg)  03/14/19 245 lb (111.1 kg)  02/27/19 242 lb (109.8 kg)     Objective:    Vital Signs:  BP 132/90   Pulse 87   Ht '5\' 2"'  (1.575 m)   Wt 237 lb (107.5 kg)   LMP 08/03/2013   SpO2 98%   BMI 43.35 kg/m    Affect appropriate Overweight white female  HEENT: normal Neck supple with no adenopathy JVP normal right bruits no thyromegaly Lungs clear with no wheezing and good diaphragmatic motion Heart:  S1/S2 no murmur, no rub, gallop or click PMI normal Abdomen: benighn, BS positve, no tenderness, no AAA no bruit.  No HSM or HJR Distal pulses intact with no bruits No edema Neuro non-focal Skin warm and dry Cast on left LE    ASSESSMENT & PLAN:    1. CAD:  DES to mid LAD/D1 10/27/18 ? Atypical presentation of SEMI with nausea and vomiting in poorly controlled diabetic. Troponin peak only 1.0 Continue DAT got patient assist ence for Brilinta  statin and beta blocker No significant disease in RCA/Circumflex Refer to cardiac rehab when opens up again  2. CHF:  EF 45-50% with RWMA in LAD territory increase lasix to 20 bid with Kdur 3. DM:  Discussed low carb diet.  Target hemoglobin A1c is 6.5 or less.  Continue current medications. 4. Thyroid:  On replacement TSH normal 5. Bipolar:  On Sinequan and Prozac f/u with Psych 6. HLD:  On statin LDL 144 in hospital f/u labs in 3 months normal LFTls 7. Preoperative: if needed to have surgery end of month probably ok as she will be at 6 month point Prefer to have DAT for a full year if possible  8. Bruit:  Right sided f/u duplex given known CAD and poorly controlled DM   COVID-19 Education: The signs and symptoms of COVID-19 were discussed with the patient and how to seek care for testing (follow up with PCP or arrange E-visit).  The importance of social distancing was discussed today.  Time:   Today, I have spent 30 minutes with the patient     Medication Adjustments/Labs and  Tests Ordered: Current medicines are reviewed at  length with the patient today.  Concerns regarding medicines are outlined above.   Tests Ordered:  Carotid US bruit   Medication Changes: No orders of the defined types were placed in this encounter.   Disposition:  F/U 6 months   Signed, Jenkins Rouge, MD  04/03/2019 11:13 AM    [place]

## 2019-03-27 ENCOUNTER — Ambulatory Visit: Payer: 59 | Admitting: Family Medicine

## 2019-04-02 ENCOUNTER — Other Ambulatory Visit: Payer: Self-pay | Admitting: Cardiovascular Disease

## 2019-04-02 MED ORDER — ATORVASTATIN CALCIUM 80 MG PO TABS: 80.0000 mg | ORAL_TABLET | Freq: Every day | ORAL | 2 refills | Status: DC

## 2019-04-03 ENCOUNTER — Other Ambulatory Visit: Payer: Self-pay

## 2019-04-03 ENCOUNTER — Telehealth: Payer: Self-pay | Admitting: Cardiovascular Disease

## 2019-04-03 ENCOUNTER — Ambulatory Visit (HOSPITAL_COMMUNITY): Payer: 59 | Attending: Cardiovascular Disease

## 2019-04-03 ENCOUNTER — Ambulatory Visit (INDEPENDENT_AMBULATORY_CARE_PROVIDER_SITE_OTHER): Payer: 59 | Admitting: Cardiovascular Disease

## 2019-04-03 ENCOUNTER — Encounter: Payer: Self-pay | Admitting: Cardiovascular Disease

## 2019-04-03 VITALS — BP 132/90 | HR 87 | Ht 62.0 in | Wt 237.0 lb

## 2019-04-03 DIAGNOSIS — R0989 Other specified symptoms and signs involving the circulatory and respiratory systems: Secondary | ICD-10-CM

## 2019-04-03 DIAGNOSIS — I5022 Chronic systolic (congestive) heart failure: Secondary | ICD-10-CM | POA: Diagnosis present

## 2019-04-03 DIAGNOSIS — I2510 Atherosclerotic heart disease of native coronary artery without angina pectoris: Secondary | ICD-10-CM

## 2019-04-03 MED ORDER — ATORVASTATIN CALCIUM 80 MG PO TABS: 80.0000 mg | ORAL_TABLET | Freq: Every day | ORAL | 3 refills | Status: DC

## 2019-04-03 MED ORDER — LOSARTAN POTASSIUM 100 MG PO TABS: 100.0000 mg | ORAL_TABLET | Freq: Every day | ORAL | 3 refills | Status: DC

## 2019-04-03 NOTE — Telephone Encounter (Signed)
New message   Patient is returning call for echo results. Please call. 

## 2019-04-03 NOTE — Patient Instructions (Signed)
Your physician recommends that you continue on your current medications as directed. Please refer to the Current Medication list given to you today.   Your physician has requested that you have a carotid duplex. This test is an ultrasound of the carotid arteries in your neck. It looks at blood flow through these arteries that supply the brain with blood. Allow one hour for this exam. There are no restrictions or special instructions.   Your physician wants you to follow-up in: [place] will receive a reminder letter in the mail two months in advance. If you don't receive a letter, please call our office to schedule the follow-up appointment.

## 2019-04-03 NOTE — Telephone Encounter (Signed)
Attempted to call the pt back to endorse echo results per Dr. Johnsie Cancel and pt did not answer, attempted to leave a message but VM was not working properly.  Echo results are mentioned below:      Notes recorded by Josue Hector, MD on 04/03/2019 at 2:01 PM EDT  EF normal no valve dx no evidence of damage from MI

## 2019-04-04 ENCOUNTER — Other Ambulatory Visit: Payer: Self-pay | Admitting: Cardiovascular Disease

## 2019-04-04 ENCOUNTER — Other Ambulatory Visit: Payer: Self-pay

## 2019-04-04 MED ORDER — TICAGRELOR 90 MG PO TABS: 90.0000 mg | ORAL_TABLET | Freq: Two times a day (BID) | ORAL | 3 refills | Status: DC

## 2019-04-04 MED ORDER — METOPROLOL TARTRATE 50 MG PO TABS: 50.0000 mg | ORAL_TABLET | Freq: Two times a day (BID) | ORAL | 3 refills | Status: DC

## 2019-04-04 MED ORDER — POTASSIUM CHLORIDE ER 10 MEQ PO TBCR: 10.0000 meq | EXTENDED_RELEASE_TABLET | Freq: Two times a day (BID) | ORAL | 3 refills | Status: DC

## 2019-04-04 NOTE — Telephone Encounter (Signed)
The patient has been notified of the result and verbalized understanding.  All questions (if any) were answered. Frederik Schmidt, RN 04/04/2019 8:00 AM

## 2019-04-05 MED ORDER — VENTOLIN HFA 108 (90 BASE) MCG/ACT IN AERS: 2.0000 | INHALATION_SPRAY | Freq: Two times a day (BID) | RESPIRATORY_TRACT | 1 refills | Status: DC

## 2019-04-05 MED ORDER — ADVAIR HFA 115-21 MCG/ACT IN AERO: 1.0000 | INHALATION_SPRAY | Freq: Two times a day (BID) | RESPIRATORY_TRACT | 1 refills | Status: DC

## 2019-04-12 ENCOUNTER — Encounter: Payer: Self-pay | Admitting: *Deleted

## 2019-04-17 ENCOUNTER — Other Ambulatory Visit: Payer: Self-pay

## 2019-04-17 ENCOUNTER — Other Ambulatory Visit (HOSPITAL_COMMUNITY): Payer: Self-pay | Admitting: Cardiovascular Disease

## 2019-04-17 ENCOUNTER — Ambulatory Visit (HOSPITAL_COMMUNITY)
Admission: RE | Admit: 2019-04-17 | Discharge: 2019-04-17 | Disposition: A | Discharge status: Home / Self Care | Payer: 59 | Source: Ambulatory Visit | Attending: Cardiovascular Disease | Admitting: Cardiovascular Disease

## 2019-04-17 DIAGNOSIS — G458 Other transient cerebral ischemic attacks and related syndromes: Secondary | ICD-10-CM

## 2019-04-17 DIAGNOSIS — R0989 Other specified symptoms and signs involving the circulatory and respiratory systems: Secondary | ICD-10-CM | POA: Diagnosis not present

## 2019-04-19 ENCOUNTER — Telehealth: Payer: Self-pay | Admitting: Nurse Practitioner

## 2019-04-19 DIAGNOSIS — I771 Stricture of artery: Secondary | ICD-10-CM

## 2019-04-19 DIAGNOSIS — I2510 Atherosclerotic heart disease of native coronary artery without angina pectoris: Secondary | ICD-10-CM

## 2019-04-19 NOTE — Telephone Encounter (Signed)
Orders placed for chest and left arm CT. Patient aware of results and agrees to proceed with further testing. She reports that her left arm is weak and she is unable to hold a cup with that hand.  She is aware that someone from our office will call to schedule her CT. She thanked me for the call.

## 2019-04-19 NOTE — Addendum Note (Signed)
Addended by: Emmaline Life on: 04/19/2019 03:44 PM   Modules accepted: Orders

## 2019-04-19 NOTE — Telephone Encounter (Signed)
-----   Message from Josue Hector, MD sent at 04/17/2019  1:36 PM EDT ----- No ICA stenosis Previous left subclavian stent not well seen if she has any significant weakness or arm symptoms on left will need chest/upper extremity CTA to evaluate

## 2019-04-25 ENCOUNTER — Other Ambulatory Visit: Payer: 59

## 2019-04-25 ENCOUNTER — Other Ambulatory Visit: Payer: 59 | Admitting: *Deleted

## 2019-04-25 ENCOUNTER — Other Ambulatory Visit: Payer: Self-pay

## 2019-04-25 DIAGNOSIS — I2510 Atherosclerotic heart disease of native coronary artery without angina pectoris: Secondary | ICD-10-CM

## 2019-04-25 DIAGNOSIS — I771 Stricture of artery: Secondary | ICD-10-CM

## 2019-04-25 LAB — BASIC METABOLIC PANEL
BUN/Creatinine Ratio: 10 (ref 9–23)
BUN: 7 mg/dL (ref 6–24)
CO2: 25 mmol/L (ref 20–29)
Calcium: 9.1 mg/dL (ref 8.7–10.2)
Chloride: 101 mmol/L (ref 96–106)
Creatinine, Ser: 0.67 mg/dL (ref 0.57–1.00)
GFR calc Af Amer: 115 mL/min/{1.73_m2} (ref >59)
GFR calc non Af Amer: 100 mL/min/{1.73_m2} (ref >59)
Glucose: 134 mg/dL — ABNORMAL HIGH (ref 65–99)
Potassium: 3.3 mmol/L — ABNORMAL LOW (ref 3.5–5.2)
Sodium: 142 mmol/L (ref 134–144)

## 2019-04-26 ENCOUNTER — Telehealth: Payer: Self-pay

## 2019-04-26 DIAGNOSIS — E875 Hyperkalemia: Secondary | ICD-10-CM

## 2019-04-26 NOTE — Telephone Encounter (Signed)
Patient takes potassium 10 meq by mouth daily. Pre protocol, since potassium is 3.3, patient will take an extra potassium pill today and tomorrow and have lab work repeated in one week.

## 2019-04-27 ENCOUNTER — Other Ambulatory Visit: Payer: 59

## 2019-04-27 ENCOUNTER — Other Ambulatory Visit: Payer: Self-pay

## 2019-04-27 ENCOUNTER — Ambulatory Visit (INDEPENDENT_AMBULATORY_CARE_PROVIDER_SITE_OTHER)
Admission: RE | Admit: 2019-04-27 | Discharge: 2019-04-27 | Disposition: A | Discharge status: Home / Self Care | Payer: 59 | Source: Ambulatory Visit | Attending: Cardiovascular Disease | Admitting: Cardiovascular Disease

## 2019-04-27 DIAGNOSIS — I771 Stricture of artery: Secondary | ICD-10-CM

## 2019-04-27 MED ORDER — IOHEXOL 350 MG/ML SOLN
100.0000 mL | Freq: Once | INTRAVENOUS | Status: AC | PRN
  Administered 2019-04-27: 100 mL via INTRAVENOUS

## 2019-04-30 ENCOUNTER — Telehealth: Payer: Self-pay | Admitting: Cardiovascular Disease

## 2019-04-30 NOTE — Telephone Encounter (Signed)
Have patient follow-up with me in 1 to 2 weeks.

## 2019-04-30 NOTE — Telephone Encounter (Signed)
New Message  Patient is calling in for the results of the CT performed on 04/27/19. Please give patient a call back to go over results.

## 2019-04-30 NOTE — Telephone Encounter (Signed)
Per note by Dr.Nishan-  Notes recorded by Josue Hector, MD on 04/29/2019 at 9:28 AM EDT  She has an occluded stent in her left subclavian it was placed in 2010 don't know by who have her f/u with Dr Gwenlyn Found don't see that she follows with [facility]  We need to have patient follow up with Dr.Berry- please schedule as soon as possible.   Advised patient of this results- and the need for follow up with Dr.Berry. Will route to scheduling team, and provider to make aware.

## 2019-04-30 NOTE — Telephone Encounter (Signed)
See below, thanks!  More thank likely will be a new patient.

## 2019-05-02 ENCOUNTER — Other Ambulatory Visit: Payer: 59

## 2019-05-02 ENCOUNTER — Ambulatory Visit: Payer: 59 | Admitting: Cardiovascular Disease

## 2019-05-08 ENCOUNTER — Ambulatory Visit: Payer: 59 | Admitting: Internal Medicine

## 2019-05-12 ENCOUNTER — Other Ambulatory Visit: Payer: Self-pay

## 2019-05-12 ENCOUNTER — Encounter (HOSPITAL_COMMUNITY): Payer: Self-pay

## 2019-05-12 ENCOUNTER — Inpatient Hospital Stay (HOSPITAL_COMMUNITY)
Admission: EM | Admit: 2019-05-12 | Discharge: 2019-05-16 | DRG: 638 | Disposition: A | Discharge status: Home / Self Care | Payer: 59 | Attending: Family Medicine | Admitting: Family Medicine

## 2019-05-12 ENCOUNTER — Emergency Department (HOSPITAL_COMMUNITY): Payer: 59

## 2019-05-12 DIAGNOSIS — Z955 Presence of coronary angioplasty implant and graft: Secondary | ICD-10-CM

## 2019-05-12 DIAGNOSIS — E039 Hypothyroidism, unspecified: Secondary | ICD-10-CM | POA: Diagnosis present

## 2019-05-12 DIAGNOSIS — E1110 Type 2 diabetes mellitus with ketoacidosis without coma: Secondary | ICD-10-CM

## 2019-05-12 DIAGNOSIS — I4892 Unspecified atrial flutter: Secondary | ICD-10-CM | POA: Diagnosis present

## 2019-05-12 DIAGNOSIS — R519 Headache, unspecified: Secondary | ICD-10-CM

## 2019-05-12 DIAGNOSIS — Z20828 Contact with and (suspected) exposure to other viral communicable diseases: Secondary | ICD-10-CM | POA: Diagnosis present

## 2019-05-12 DIAGNOSIS — E119 Type 2 diabetes mellitus without complications: Secondary | ICD-10-CM | POA: Diagnosis not present

## 2019-05-12 DIAGNOSIS — Z880 Allergy status to penicillin: Secondary | ICD-10-CM

## 2019-05-12 DIAGNOSIS — E1065 Type 1 diabetes mellitus with hyperglycemia: Secondary | ICD-10-CM

## 2019-05-12 DIAGNOSIS — Z79899 Other long term (current) drug therapy: Secondary | ICD-10-CM

## 2019-05-12 DIAGNOSIS — Z794 Long term (current) use of insulin: Secondary | ICD-10-CM

## 2019-05-12 DIAGNOSIS — I252 Old myocardial infarction: Secondary | ICD-10-CM

## 2019-05-12 DIAGNOSIS — E860 Dehydration: Secondary | ICD-10-CM | POA: Diagnosis present

## 2019-05-12 DIAGNOSIS — E559 Vitamin D deficiency, unspecified: Secondary | ICD-10-CM | POA: Diagnosis present

## 2019-05-12 DIAGNOSIS — N179 Acute kidney failure, unspecified: Secondary | ICD-10-CM | POA: Diagnosis not present

## 2019-05-12 DIAGNOSIS — Z6841 Body Mass Index (BMI) 40.0 and over, adult: Secondary | ICD-10-CM | POA: Diagnosis not present

## 2019-05-12 DIAGNOSIS — Z90710 Acquired absence of both cervix and uterus: Secondary | ICD-10-CM

## 2019-05-12 DIAGNOSIS — D751 Secondary polycythemia: Secondary | ICD-10-CM

## 2019-05-12 DIAGNOSIS — K760 Fatty (change of) liver, not elsewhere classified: Secondary | ICD-10-CM | POA: Diagnosis present

## 2019-05-12 DIAGNOSIS — I110 Hypertensive heart disease with heart failure: Secondary | ICD-10-CM | POA: Diagnosis present

## 2019-05-12 DIAGNOSIS — I5022 Chronic systolic (congestive) heart failure: Secondary | ICD-10-CM | POA: Diagnosis present

## 2019-05-12 DIAGNOSIS — Z888 Allergy status to other drugs, medicaments and biological substances status: Secondary | ICD-10-CM

## 2019-05-12 DIAGNOSIS — E1165 Type 2 diabetes mellitus with hyperglycemia: Secondary | ICD-10-CM

## 2019-05-12 DIAGNOSIS — Z7951 Long term (current) use of inhaled steroids: Secondary | ICD-10-CM

## 2019-05-12 DIAGNOSIS — I2510 Atherosclerotic heart disease of native coronary artery without angina pectoris: Secondary | ICD-10-CM | POA: Diagnosis present

## 2019-05-12 DIAGNOSIS — I4581 Long QT syndrome: Secondary | ICD-10-CM | POA: Diagnosis present

## 2019-05-12 DIAGNOSIS — F3130 Bipolar disorder, current episode depressed, mild or moderate severity, unspecified: Secondary | ICD-10-CM | POA: Diagnosis present

## 2019-05-12 DIAGNOSIS — Z79890 Hormone replacement therapy (postmenopausal): Secondary | ICD-10-CM

## 2019-05-12 DIAGNOSIS — E785 Hyperlipidemia, unspecified: Secondary | ICD-10-CM | POA: Diagnosis present

## 2019-05-12 DIAGNOSIS — F17210 Nicotine dependence, cigarettes, uncomplicated: Secondary | ICD-10-CM | POA: Diagnosis present

## 2019-05-12 DIAGNOSIS — E876 Hypokalemia: Secondary | ICD-10-CM | POA: Diagnosis present

## 2019-05-12 DIAGNOSIS — F419 Anxiety disorder, unspecified: Secondary | ICD-10-CM | POA: Diagnosis present

## 2019-05-12 DIAGNOSIS — R110 Nausea: Secondary | ICD-10-CM

## 2019-05-12 DIAGNOSIS — Z23 Encounter for immunization: Secondary | ICD-10-CM

## 2019-05-12 DIAGNOSIS — E871 Hypo-osmolality and hyponatremia: Secondary | ICD-10-CM | POA: Diagnosis present

## 2019-05-12 DIAGNOSIS — R197 Diarrhea, unspecified: Secondary | ICD-10-CM | POA: Diagnosis not present

## 2019-05-12 DIAGNOSIS — Z881 Allergy status to other antibiotic agents status: Secondary | ICD-10-CM

## 2019-05-12 DIAGNOSIS — Z9641 Presence of insulin pump (external) (internal): Secondary | ICD-10-CM | POA: Diagnosis present

## 2019-05-12 DIAGNOSIS — Z8049 Family history of malignant neoplasm of other genital organs: Secondary | ICD-10-CM

## 2019-05-12 DIAGNOSIS — I5032 Chronic diastolic (congestive) heart failure: Secondary | ICD-10-CM | POA: Diagnosis not present

## 2019-05-12 DIAGNOSIS — Z8711 Personal history of peptic ulcer disease: Secondary | ICD-10-CM

## 2019-05-12 DIAGNOSIS — E669 Obesity, unspecified: Secondary | ICD-10-CM | POA: Diagnosis present

## 2019-05-12 DIAGNOSIS — Z8249 Family history of ischemic heart disease and other diseases of the circulatory system: Secondary | ICD-10-CM

## 2019-05-12 DIAGNOSIS — I959 Hypotension, unspecified: Secondary | ICD-10-CM | POA: Diagnosis present

## 2019-05-12 DIAGNOSIS — G8929 Other chronic pain: Secondary | ICD-10-CM | POA: Diagnosis not present

## 2019-05-12 DIAGNOSIS — I493 Ventricular premature depolarization: Secondary | ICD-10-CM | POA: Diagnosis not present

## 2019-05-12 DIAGNOSIS — D72829 Elevated white blood cell count, unspecified: Secondary | ICD-10-CM

## 2019-05-12 DIAGNOSIS — K219 Gastro-esophageal reflux disease without esophagitis: Secondary | ICD-10-CM | POA: Diagnosis present

## 2019-05-12 DIAGNOSIS — Z833 Family history of diabetes mellitus: Secondary | ICD-10-CM

## 2019-05-12 DIAGNOSIS — Z8349 Family history of other endocrine, nutritional and metabolic diseases: Secondary | ICD-10-CM

## 2019-05-12 DIAGNOSIS — Z8673 Personal history of transient ischemic attack (TIA), and cerebral infarction without residual deficits: Secondary | ICD-10-CM

## 2019-05-12 DIAGNOSIS — Z7982 Long term (current) use of aspirin: Secondary | ICD-10-CM

## 2019-05-12 DIAGNOSIS — E1010 Type 1 diabetes mellitus with ketoacidosis without coma: Principal | ICD-10-CM | POA: Diagnosis present

## 2019-05-12 DIAGNOSIS — J449 Chronic obstructive pulmonary disease, unspecified: Secondary | ICD-10-CM | POA: Diagnosis present

## 2019-05-12 DIAGNOSIS — F319 Bipolar disorder, unspecified: Secondary | ICD-10-CM | POA: Diagnosis present

## 2019-05-12 DIAGNOSIS — Z885 Allergy status to narcotic agent status: Secondary | ICD-10-CM

## 2019-05-12 DIAGNOSIS — Z7902 Long term (current) use of antithrombotics/antiplatelets: Secondary | ICD-10-CM

## 2019-05-12 DIAGNOSIS — Z95820 Peripheral vascular angioplasty status with implants and grafts: Secondary | ICD-10-CM

## 2019-05-12 DIAGNOSIS — K859 Acute pancreatitis without necrosis or infection, unspecified: Secondary | ICD-10-CM

## 2019-05-12 HISTORY — DX: Type 2 diabetes mellitus with ketoacidosis without coma: E11.10

## 2019-05-12 LAB — CBC WITH DIFFERENTIAL/PLATELET
Abs Immature Granulocytes: 0 10*3/uL (ref 0.00–0.07)
Basophils Absolute: 0.2 10*3/uL — ABNORMAL HIGH (ref 0.0–0.1)
Basophils Relative: 1 %
Eosinophils Absolute: 0 10*3/uL (ref 0.0–0.5)
Eosinophils Relative: 0 %
HCT: 50.5 % — ABNORMAL HIGH (ref 36.0–46.0)
Hemoglobin: 15.7 g/dL — ABNORMAL HIGH (ref 12.0–15.0)
Lymphocytes Relative: 4 %
Lymphs Abs: 0.9 10*3/uL (ref 0.7–4.0)
MCH: 21 pg — ABNORMAL LOW (ref 26.0–34.0)
MCHC: 31.1 g/dL (ref 30.0–36.0)
MCV: 67.4 fL — ABNORMAL LOW (ref 80.0–100.0)
Monocytes Absolute: 0.2 10*3/uL (ref 0.1–1.0)
Monocytes Relative: 1 %
Neutro Abs: 21.1 10*3/uL — ABNORMAL HIGH (ref 1.7–7.7)
Neutrophils Relative %: 94 %
Platelets: 449 10*3/uL — ABNORMAL HIGH (ref 150–400)
RBC: 7.49 MIL/uL — ABNORMAL HIGH (ref 3.87–5.11)
RDW: 18.8 % — ABNORMAL HIGH (ref 11.5–15.5)
WBC: 22.4 10*3/uL — ABNORMAL HIGH (ref 4.0–10.5)
nRBC: 0 % (ref 0.0–0.2)
nRBC: 1 /100 WBC — ABNORMAL HIGH

## 2019-05-12 LAB — POCT I-STAT EG7
Acid-base deficit: 17 mmol/L — ABNORMAL HIGH (ref 0.0–2.0)
Bicarbonate: 9.6 mmol/L — ABNORMAL LOW (ref 20.0–28.0)
Calcium, Ion: 1.06 mmol/L — ABNORMAL LOW (ref 1.15–1.40)
HCT: 55 % — ABNORMAL HIGH (ref 36.0–46.0)
Hemoglobin: 18.7 g/dL — ABNORMAL HIGH (ref 12.0–15.0)
O2 Saturation: 85 %
Potassium: 4.9 mmol/L (ref 3.5–5.1)
Sodium: 125 mmol/L — ABNORMAL LOW (ref 135–145)
TCO2: 10 mmol/L — ABNORMAL LOW (ref 22–32)
pCO2, Ven: 25.2 mmHg — ABNORMAL LOW (ref 44.0–60.0)
pH, Ven: 7.188 — CL (ref 7.250–7.430)
pO2, Ven: 61 mmHg — ABNORMAL HIGH (ref 32.0–45.0)

## 2019-05-12 LAB — URINALYSIS, ROUTINE W REFLEX MICROSCOPIC
Bacteria, UA: NONE SEEN
Bilirubin Urine: NEGATIVE
Glucose, UA: >=500 mg/dL — AB
Hgb urine dipstick: NEGATIVE
Ketones, ur: 80 mg/dL — AB
Leukocytes,Ua: NEGATIVE
Nitrite: NEGATIVE
Protein, ur: NEGATIVE mg/dL
Specific Gravity, Urine: 1.025 (ref 1.005–1.030)
pH: 5 (ref 5.0–8.0)

## 2019-05-12 LAB — D-DIMER, QUANTITATIVE: D-Dimer, Quant: 0.51 ug/mL-FEU — ABNORMAL HIGH (ref 0.00–0.50)

## 2019-05-12 LAB — COMPREHENSIVE METABOLIC PANEL
ALT: 25 U/L (ref 0–44)
AST: 17 U/L (ref 15–41)
Albumin: 4.5 g/dL (ref 3.5–5.0)
Alkaline Phosphatase: 121 U/L (ref 38–126)
Anion gap: 34 — ABNORMAL HIGH (ref 5–15)
BUN: 31 mg/dL — ABNORMAL HIGH (ref 6–20)
CO2: 8 mmol/L — ABNORMAL LOW (ref 22–32)
Calcium: 9.1 mg/dL (ref 8.9–10.3)
Chloride: 84 mmol/L — ABNORMAL LOW (ref 98–111)
Creatinine, Ser: 2.15 mg/dL — ABNORMAL HIGH (ref 0.44–1.00)
GFR calc Af Amer: 29 mL/min — ABNORMAL LOW (ref >60)
GFR calc non Af Amer: 25 mL/min — ABNORMAL LOW (ref >60)
Glucose, Bld: 870 mg/dL (ref 70–99)
Potassium: 5.1 mmol/L (ref 3.5–5.1)
Sodium: 126 mmol/L — ABNORMAL LOW (ref 135–145)
Total Bilirubin: 2.5 mg/dL — ABNORMAL HIGH (ref 0.3–1.2)
Total Protein: 8 g/dL (ref 6.5–8.1)

## 2019-05-12 LAB — CBG MONITORING, ED
Glucose-Capillary: >600 mg/dL (ref 70–99)
Glucose-Capillary: >600 mg/dL (ref 70–99)
Glucose-Capillary: >600 mg/dL (ref 70–99)
Glucose-Capillary: 553 mg/dL (ref 70–99)
Glucose-Capillary: 479 mg/dL — ABNORMAL HIGH (ref 70–99)
Glucose-Capillary: 364 mg/dL — ABNORMAL HIGH (ref 70–99)

## 2019-05-12 LAB — LIPASE, BLOOD: Lipase: 189 U/L — ABNORMAL HIGH (ref 11–51)

## 2019-05-12 LAB — I-STAT BETA HCG BLOOD, ED (MC, WL, AP ONLY): I-stat hCG, quantitative: 9.8 m[IU]/mL — ABNORMAL HIGH (ref <5)

## 2019-05-12 LAB — LACTIC ACID, PLASMA
Lactic Acid, Venous: 2.6 mmol/L (ref 0.5–1.9)
Lactic Acid, Venous: 1.9 mmol/L (ref 0.5–1.9)

## 2019-05-12 MED ORDER — TICAGRELOR 90 MG PO TABS
90.0000 mg | ORAL_TABLET | Freq: Two times a day (BID) | ORAL | Status: DC
  Administered 2019-05-12 – 2019-05-16 (×8): 90 mg via ORAL
  Filled 2019-05-12 (×8): qty 1

## 2019-05-12 MED ORDER — INSULIN REGULAR(HUMAN) IN NACL 100-0.9 UT/100ML-% IV SOLN
INTRAVENOUS | Status: DC
  Administered 2019-05-13: 12.7 [IU]/h via INTRAVENOUS
  Administered 2019-05-13: 13.7 [IU]/h via INTRAVENOUS
  Administered 2019-05-13: 10.4 [IU]/h via INTRAVENOUS
  Administered 2019-05-13: 14.6 [IU]/h via INTRAVENOUS
  Administered 2019-05-13: 11 [IU]/h via INTRAVENOUS
  Filled 2019-05-12 (×2): qty 100

## 2019-05-12 MED ORDER — HEPARIN SODIUM (PORCINE) 5000 UNIT/ML IJ SOLN
5000.0000 [IU] | Freq: Three times a day (TID) | INTRAMUSCULAR | Status: DC
  Administered 2019-05-13 – 2019-05-16 (×12): 5000 [IU] via SUBCUTANEOUS
  Filled 2019-05-12 (×12): qty 1

## 2019-05-12 MED ORDER — LEVOTHYROXINE SODIUM 75 MCG PO TABS
150.0000 ug | ORAL_TABLET | Freq: Every day | ORAL | Status: DC
  Administered 2019-05-13 – 2019-05-16 (×4): 150 ug via ORAL
  Filled 2019-05-12 (×4): qty 2

## 2019-05-12 MED ORDER — DEXTROSE-NACL 5-0.45 % IV SOLN
INTRAVENOUS | Status: DC
  Administered 2019-05-13 (×3): via INTRAVENOUS

## 2019-05-12 MED ORDER — SODIUM CHLORIDE 0.9 % IV SOLN
INTRAVENOUS | Status: DC
  Administered 2019-05-13: via INTRAVENOUS

## 2019-05-12 MED ORDER — SODIUM CHLORIDE 0.9 % IV BOLUS
1000.0000 mL | Freq: Once | INTRAVENOUS | Status: AC
  Administered 2019-05-12: 1000 mL via INTRAVENOUS

## 2019-05-12 MED ORDER — ALBUTEROL SULFATE (2.5 MG/3ML) 0.083% IN NEBU: 2.5000 mg | INHALATION_SOLUTION | Freq: Four times a day (QID) | RESPIRATORY_TRACT | Status: DC | PRN

## 2019-05-12 MED ORDER — LAMOTRIGINE 25 MG PO TABS
150.0000 mg | ORAL_TABLET | Freq: Every day | ORAL | Status: DC
  Administered 2019-05-13 – 2019-05-16 (×4): 150 mg via ORAL
  Filled 2019-05-12 (×4): qty 1

## 2019-05-12 MED ORDER — ONDANSETRON HCL 4 MG/2ML IJ SOLN
4.0000 mg | Freq: Once | INTRAMUSCULAR | Status: AC
  Administered 2019-05-12: 18:00:00 4 mg via INTRAVENOUS
  Filled 2019-05-12: qty 2

## 2019-05-12 MED ORDER — POTASSIUM CHLORIDE 10 MEQ/100ML IV SOLN
10.0000 meq | INTRAVENOUS | Status: AC
  Administered 2019-05-12 – 2019-05-13 (×2): 10 meq via INTRAVENOUS
  Filled 2019-05-12 (×2): qty 100

## 2019-05-12 MED ORDER — MOMETASONE FURO-FORMOTEROL FUM 200-5 MCG/ACT IN AERO
2.0000 | INHALATION_SPRAY | Freq: Two times a day (BID) | RESPIRATORY_TRACT | Status: DC
  Administered 2019-05-13 – 2019-05-16 (×7): 2 via RESPIRATORY_TRACT
  Filled 2019-05-12: qty 8.8

## 2019-05-12 MED ORDER — DEXTROSE-NACL 5-0.45 % IV SOLN: INTRAVENOUS | Status: DC

## 2019-05-12 MED ORDER — LURASIDONE HCL 40 MG PO TABS
60.0000 mg | ORAL_TABLET | Freq: Every day | ORAL | Status: DC
  Administered 2019-05-13 – 2019-05-16 (×4): 60 mg via ORAL
  Filled 2019-05-12 (×4): qty 2

## 2019-05-12 MED ORDER — FLUOXETINE HCL 20 MG PO CAPS
20.0000 mg | ORAL_CAPSULE | Freq: Every day | ORAL | Status: DC
  Administered 2019-05-13 – 2019-05-16 (×4): 20 mg via ORAL
  Filled 2019-05-12 (×4): qty 1

## 2019-05-12 MED ORDER — SODIUM CHLORIDE 0.9 % IV SOLN
INTRAVENOUS | Status: DC
  Administered 2019-05-12 (×2): via INTRAVENOUS

## 2019-05-12 MED ORDER — INSULIN REGULAR(HUMAN) IN NACL 100-0.9 UT/100ML-% IV SOLN
INTRAVENOUS | Status: DC
  Administered 2019-05-12: 19:00:00 5.4 [IU]/h via INTRAVENOUS
  Filled 2019-05-12: qty 100

## 2019-05-12 MED ORDER — ASPIRIN EC 81 MG PO TBEC
81.0000 mg | DELAYED_RELEASE_TABLET | Freq: Every day | ORAL | Status: DC
  Administered 2019-05-13 – 2019-05-16 (×4): 81 mg via ORAL
  Filled 2019-05-12 (×4): qty 1

## 2019-05-12 MED ORDER — ATORVASTATIN CALCIUM 80 MG PO TABS
80.0000 mg | ORAL_TABLET | Freq: Every day | ORAL | Status: DC
  Administered 2019-05-13 – 2019-05-16 (×4): 80 mg via ORAL
  Filled 2019-05-12 (×4): qty 1

## 2019-05-12 NOTE — H&P (Signed)
[facility] Admission History and Physical Service Pager: [phone]  Patient name: Morgan [name] Medical record number: [id number] Date of birth: 08-12-1965 Age: 53 y.o. Gender: female  Primary Care Provider: Shirley, [place], DO Consultants: none Code Status: full Preferred Emergency Contact: Sansa Alkema, son, [phone]   Chief Complaint: nausea, vomiting  Assessment and Plan: Morgan [name] is a 53 y.o. female presenting with intractable nausea, vomiting, and fatigue.  Diabetic ketoacidosis/metabolic acidosis/nausea/vomiting Anion gap of 33, pH 7.2 on VBG.  Unclear what patient's trigger for DKA is.  History somewhat limited as patient quite sleepy still during exam.  Insulin usually managed by insulin pump, which is managed by her endocrinologist.  Started on insulin drip in the emergency department.  Status post 2 L normal saline bolus.  Plan to continue insulin drip until anion gap closes.  Continue normal saline at 125 mL/hr until CBG less than 250, then will switch over to D5 half-normal saline at 125.  Once anion gap closed will transition to basal insulin coupled with the patient's insulin pump.  Hemoglobin A1c pending. -Admit to inpatient family medicine, Dr. Gwendlyn Deutscher, progressive unit -N.p.o. -Normal saline at 125 mL/h -Transition to D5 half-normal saline when CBG less than 250 -Insulin drip until anion gap closes -Transition to Lantus and insulin pump when gap closes -Every 4 hour BMPs -Plan to keep potassium above 5 while on insulin GTT  Drowsiness Likely secondary to DKA and corresponding metabolic acidosis.  Has improved somewhat while in the emergency department.  We will continue to check on patient's mental status.  Expect to improve with fluid and insulin administration.  Acute kidney injury/dehydration Creatinine 2.15 on admission.  GFR 25.  Baseline creatinine 0.67.  Likely secondary to dehydration due to her DKA.  Status post 2 L  bolus normal saline.  Now on normal saline 125 mL/h.  Will transition to D5 half-normal when CBG less than 250.  Patient presenting with significant dehydration.  Tachycardia, relative hypotension, and hemoglobin 18.7 on admission. -Every 4 hour BMPs while on insulin drip -Normal saline 125 mL/h -Transition to D5 half-normal saline at 125 mL/h when CBG less than 250   Lactic acidosis Initial lactic acid 2.6, 1.9 recheck after fluid administration.  No need to continue checking.  Will consider resolved.  Elevated lipase Likely secondary to diabetic ketoacidosis.  Expect to improve with fluid administration and insulin.  Could consider rechecking prior to discharge, but this is likely not necessary.  Atrial flutter Seen on EKG.  Likely secondary to DKA and electrolyte abnormalities.  Repeat EKG in a.m.  Put on cardiac monitoring.  Replete electrolytes as above.  Elevated beta hCG Unclear if this is falsely elevated.  Will get urine hCG to to rule out possibility of heterophile antibodies causing false positive result.  If urine hCG positive would likely need investigate possibility of pregnancy or possible trophoblastic disease.  Leukocytosis/polycythemia White blood cell count 22.4.  Likely secondary to inflammation and dehydration.  Recheck CBC in a.m.  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  We will continue home Prozac, lamotrigine, Latuda and watch carefully.  Placed on cardiac monitoring, repeat EKG in a.m.  History of coronary artery stent placement Takes aspirin and Brilinta.  We will continue these agents while in the hospital.  Also be on heparin for DVT prophylaxis.  Monitor for any signs or symptoms of bleeding.  History of hypothyroidism Last TSH around 3.  Appears to be well  controlled on Synthroid 150 mcg.  Bipolar depression Patient on Prozac 20 mg, lamotrigine 150 mg, Latuda 60 mg daily.  Patient with very mild QTC prolongation.  We  will continue home psychiatric medications but will continue to monitor QTC closely.  Hyperlipidemia Last lipid panel in 2/20.  HDL 37, cholesterol 190.  Will continue atorvastatin 80 mg while in hospital.  PMH is significant for HFpEF, bipolar depression, hypertension, hyperlipidemia, hypothyroidism, history of coronary artery stent placement, type 2 diabetes  FEN/GI: N.p.o. Prophylaxis: Lovenox   Disposition: To stepdown, likely home pending clinical course  History of Present Illness:  Morgan [name] is a 53 y.o. female presenting with nausea and vomiting.  Limited history is obtained as the patient was quite sleepy during the interview.  Patient states that she started feeling bad, having nausea, and vomiting starting on 05/07/2019. When she failed to improve this prompted her to come in for further evaluation.  Of note patient is a type II diabetic you has an insulin pump in place.  This is managed by her endocrinologist.  Patient is unsure what specifically is triggered her high sugars.  Work-up in the ED consisted of a BMP, VBG, lactic acid, CBC, d-dimer, beta hCG, COVID PCR, UA, urine culture, chest x-ray.  VBG significant for pH 7.2, PCO2 25.  CMP significant for sodium 125, chloride 84, CO2 8, creatinine 2.15, glucose greater than 800, lipase 189, BUN 31.  UA greater than 500 glucose, ketones 80.  Chest x-ray showed central congestion no overt edema.  EKG showed a flutter.  Patient started on insulin drip, given 2 L normal saline, Zofran 4 mg while in ED.  Review Of Systems: Per HPI with the following additions:   ROS  Complete review of systems cannot be obtained as patient was falling asleep intermittently during exam  Patient Active Problem List   Diagnosis Date Noted  . DKA (diabetic ketoacidoses) ([place]) 05/12/2019  . Intertrigo 03/05/2019  . Type 2 diabetes mellitus with hyperglycemia, with long-term current use of insulin ([place]) 11/17/2018  . S/P coronary artery stent  placement   . Acute systolic heart failure ([place])   . Hyperlipidemia LDL goal <70   . Intractable nausea and vomiting 10/26/2018  . Esophagitis determined by endoscopy   . Dysphagia 07/24/2018  . Odynophagia 07/23/2018  . Orthostatic hypotension 07/22/2018  . Tachycardia 07/22/2018  . Hepatic steatosis   . Elevated transaminase level 07/21/2018  . Mild renal insufficiency 07/21/2018  . Leukocytosis 07/21/2018  . Bipolar depression ([place]) 07/21/2018  . Incidental lung nodule 07/21/2018  . Extravasation accident, initial encounter 07/21/2018  . Prolonged QT interval 07/21/2018  . LFTs abnormal   . Hyperlipidemia 05/14/2016  . Hypothyroidism 05/14/2016  . Essential hypertension 05/14/2016    Past Medical History: Past Medical History:  Diagnosis Date  . Anxiety   . Asthma   . Bipolar depression ([place])   . Chronic bronchitis ([place])   . Chronic esophagogastric ulcer   . Chronic stomach ulcer   . Fibroid   . GERD (gastroesophageal reflux disease)   . Heart murmur   . Hepatic steatosis   . History of blood transfusion 2010   "related to subclavian stent"  . History of hiatal hernia   . Hyperlipidemia   . Hypertension   . Hypothyroid   . Migraine    "cerebral migraines; 1-2/month" (06/29/2018)  . Non-ST elevation (NSTEMI) myocardial infarction ([place]) 10/27/2018  . 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 Stroke [facility])    "  I've had 2; most recent one was ~ 2015 or before; affected balance" (06/29/2018)  . Thalassemia minor   . Type II diabetes mellitus ([place])   . Vitamin D deficiency     Past Surgical History: Past Surgical History:  Procedure Laterality Date  . ABDOMINAL HYSTERECTOMY  2015   "still have 1 ovary"  . BIOPSY  07/25/2018   Procedure: BIOPSY;  Surgeon: Thornton Park, MD;  Location: [facility] ENDOSCOPY;  Service: Gastroenterology;;  . [place]; 1996  . CORONARY STENT INTERVENTION N/A 10/27/2018   Procedure: CORONARY STENT INTERVENTION;  Surgeon:  Sherren Mocha, MD;  Location: [place] CV LAB;  Service: Cardiovascular;  Laterality: N/A;  . ESOPHAGOGASTRODUODENOSCOPY (EGD) WITH PROPOFOL N/A 07/25/2018   Procedure: ESOPHAGOGASTRODUODENOSCOPY (EGD) WITH PROPOFOL;  Surgeon: Thornton Park, MD;  Location: [place];  Service: Gastroenterology;  Laterality: N/A;  . KNEE RECONSTRUCTION Left 1980  . LAPAROSCOPIC CHOLECYSTECTOMY    . LEFT HEART CATH AND CORONARY ANGIOGRAPHY N/A 10/27/2018   Procedure: LEFT HEART CATH AND CORONARY ANGIOGRAPHY;  Surgeon: Sherren Mocha, MD;  Location: [place] CV LAB;  Service: Cardiovascular;  Laterality: N/A;  . SUBCLAVIAN STENT PLACEMENT Left 2010  . TONSILLECTOMY  1970    Social History: Social History   Tobacco Use  . Smoking status: Former Smoker    Packs/day: 2.00    Years: 36.00    Pack years: 72.00    Types: Cigarettes    Quit date: 05/14/2014    Years since quitting: 4.9  . Smokeless tobacco: Never Used  Substance Use Topics  . Alcohol use: Not Currently  . Drug use: Not Currently   Additional social history:  Please also refer to relevant sections of EMR.  Family History: Family History  Problem Relation Age of Onset  . Diabetes Mother   . Hypertension Mother   . Hyperlipidemia Mother   . CAD Mother   . Cervical cancer Mother   . CAD Father   . Heart failure Father   . Testicular cancer Father   . Hypertension Sister   . Hyperlipidemia Sister   . Hypertension Brother   . Hyperlipidemia Brother     Allergies and Medications: Allergies  Allergen Reactions  . Ilosone [Erythromycin] Anaphylaxis  . Keflex [Cephalexin] Anaphylaxis  . Penicillins Anaphylaxis    Has patient had a PCN reaction causing immediate rash, facial/tongue/throat swelling, SOB or lightheadedness with hypotension: YES Has patient had a PCN reaction causing severe rash involving mucus membranes or skin necrosis: NO Has patient had a PCN reaction that required hospitalizationNO Has patient had  a PCN reaction occurring within the last 10 years: NO If all of the above answers are "NO", then may proceed with Cephalosporin use.  . Iron Other (See Comments)    Pt reports condition limiting Iron intake.  . Metformin And Related Other (See Comments)    Pt prefers to not take this medication  . Morphine And Related Other (See Comments)    Pt reports hallucinations   No current facility-administered medications on file prior to encounter.    Current Outpatient Medications on File Prior to Encounter  Medication Sig Dispense Refill  . Accu-Chek FastClix Lancets MISC 1 Package by Does not apply route 3 (three) times daily. 150 each 3  . ADVAIR HFA 115-21 MCG/ACT inhaler Inhale 1 puff into the lungs 2 (two) times daily. 1 Inhaler 1  . aspirin 81 MG tablet Take 1 tablet (81 mg total) by mouth daily. 30 tablet 1  . atorvastatin (  LIPITOR) 80 MG tablet Take 1 tablet (80 mg total) by mouth daily. 90 tablet 3  . Blood Glucose Monitoring Suppl (CONTOUR NEXT ONE) KIT 1 kit by Does not apply route as directed. 1 kit 0  . butalbital-acetaminophen-caffeine (FIORICET, ESGIC) 50-325-40 MG tablet Take 1-2 tablets by mouth every 6 (six) hours as needed for headache or migraine. 30 tablet 0  . Cholecalciferol (VITAMIN D3) 50 MCG (2000 UT) TABS Take 2,000-4,000 Units by mouth See admin instructions. Take 2 tablets by mouth once a day on Mon/Wed/Fri and take 1 tablet on Sun/Tues/Thurs/Sat    . EPINEPHrine 0.3 mg/0.3 mL IJ SOAJ injection Inject 0.3 mg into the muscle once as needed (for an anaphylactic reaction).   1  . FLUoxetine (PROZAC) 20 MG tablet Take 20 mg by mouth daily.     . furosemide (LASIX) 20 MG tablet Take 1 tablet (20 mg total) by mouth 2 (two) times daily. 180 tablet 3  . GLUCAGON EMERGENCY 1 MG injection Inject 1 mg into the vein once as needed (FOR ONSET OF HYPOGLYCEMIA).   2  . glucose blood (CONTOUR NEXT TEST) test strip Use as instructed 400 each 12  . HYDROcodone-acetaminophen (NORCO)  5-325 MG tablet Take 1 tablet by mouth every 4 (four) hours as needed for severe pain. 15 tablet 0  . Insulin Human (INSULIN PUMP) SOLN Inject into the skin continuous. Humalog    . lamoTRIgine (LAMICTAL) 150 MG tablet Take 150 mg by mouth daily.    [name] levothyroxine (SYNTHROID, LEVOTHROID) 150 MCG tablet Take 150 mcg by mouth daily.     [name] losartan (COZAAR) 100 MG tablet Take 1 tablet (100 mg total) by mouth daily. 90 tablet 3  . Lurasidone HCl (LATUDA) 60 MG TABS Take 60 mg by mouth daily.    . metoprolol tartrate (LOPRESSOR) 50 MG tablet Take 1 tablet (50 mg total) by mouth 2 (two) times daily. 180 tablet 3  . Multiple Vitamin (MULTIVITAMIN WITH MINERALS) TABS tablet Take 1 tablet by mouth daily.    . nitroGLYCERIN (NITROSTAT) 0.4 MG SL tablet Place 1 tablet (0.4 mg total) under the tongue every 5 (five) minutes x 3 doses as needed for chest pain. 30 tablet 12  . nystatin (MYCOSTATIN/NYSTOP) powder Apply topically as needed. 15 g 0  . nystatin ointment (MYCOSTATIN) Apply 1 application topically 2 (two) times daily. 30 g 0  . pantoprazole (PROTONIX) 40 MG tablet Take 1 tablet (40 mg total) by mouth 2 (two) times daily before a meal. X 8 weeks 60 tablet 3  . potassium chloride (K-DUR) 10 MEQ tablet Take 1 tablet (10 mEq total) by mouth 2 (two) times daily. 180 tablet 3  . Semaglutide,0.25 or 0.5MG/DOS, (OZEMPIC, 0.25 OR 0.5 MG/DOSE,) 2 MG/1.5ML SOPN Inject 0.25 mg into the skin once a week. 3 pen 6  . ticagrelor (BRILINTA) 90 MG TABS tablet Take 1 tablet (90 mg total) by mouth 2 (two) times daily. 180 tablet 3  . traMADol (ULTRAM) 50 MG tablet Take 0.5 tablets (25 mg total) by mouth every 12 (twelve) hours as needed for moderate pain. 8 tablet 0  . VENTOLIN HFA 108 (90 Base) MCG/ACT inhaler Inhale 2 puffs into the lungs 2 (two) times daily. 18 g 1    Objective: BP (!) 94/46   Pulse (!) 124   Temp (!) 97.3 F (36.3 C) (Rectal)   Resp (!) 23   Ht 5' 2.5" (1.588 m)   Wt 129.3 kg   LMP  08/03/2013  SpO2 98%   BMI 51.30 kg/m  Exam: General: 53 year old Caucasian female, obese, very drowsy.  GCS 14 (E3, V5, M6) Eyes: EOMI, PERRLA ENTM: Dry mucous membranes, Cardiovascular: Tachycardic, normal rhythm.  No bilateral lower extremity edema noted.  Skin warm and dry Respiratory: Lungs clear to auscultation bilaterally, no accessory muscle use Gastrointestinal: Soft, nontender, nondistended.  MSK: No upper or lower extremity weakness Derm: Warm and dry Neuro: CN II through XII intact, no focal neurologic deficit, GCS 14 Psych: Appropriate, very drowsy  Labs and Imaging: CBC BMET  Recent Labs  Lab 05/12/19 1736 05/12/19 1826  WBC 22.4*  --   HGB 15.7* 18.7*  HCT 50.5* 55.0*  PLT 449*  --    Recent Labs  Lab 05/12/19 1736 05/12/19 1826  NA 126* 125*  K 5.1 4.9  CL 84*  --   CO2 8*  --   BUN 31*  --   CREATININE 2.15*  --   GLUCOSE 870*  --   CALCIUM 9.1  --      EKG: atrial flutter, HR 130  [name], MD 05/12/2019, 8:38 PM PGY-3, [place] Intern pager: [phone], text pages welcome

## 2019-05-12 NOTE — ED Triage Notes (Signed)
Pt from home with ems for generalized weakness, dizziness and n/v/d for the past 2 days. Pt arrives responsive to voice, pale with weak radial pulses. BP low and fluctuating with ems.  61/22 91/42 179/87

## 2019-05-12 NOTE — ED Provider Notes (Signed)
[place] EMERGENCY DEPARTMENT Provider Note   CSN: [id number] Arrival date & time: 05/12/19  1720     History   Chief Complaint Chief Complaint  Patient presents with  . Weakness  . Emesis    HPI NGOZI ALVIDREZ is a 53 y.o. female presented for evaluation of nausea, vomiting, weakness.  Patient states the past 2 days, she has been feeling poorly.  She reports persistent nausea and vomiting, but continues to assess again to drink.  She does not know what triggered her symptoms.  She denies recent fevers, chills, cough.  Patient does report some shortness of breath, and has been having months of left leg pain since she fractured her distal leg.  She denies chest pain, abdominal pain, urinary symptoms, abnormal bowel movements.  Additional history obtained per chart review.  Patient with a history of anxiety, asthma, bipolar, COPD, GERD, hypertension, hyperlipidemia, hypothyroidism, and STEMI, CVA, diabetes, possible stenosis of L subclavian stent.     HPI  Past Medical History:  Diagnosis Date  . Anxiety   . Asthma   . Bipolar depression ([place])   . Chronic bronchitis ([place])   . Chronic esophagogastric ulcer   . Chronic stomach ulcer   . Fibroid   . GERD (gastroesophageal reflux disease)   . Heart murmur   . Hepatic steatosis   . History of blood transfusion 2010   "related to subclavian stent"  . History of hiatal hernia   . Hyperlipidemia   . Hypertension   . Hypothyroid   . Migraine    "cerebral migraines; 1-2/month" (06/29/2018)  . Non-ST elevation (NSTEMI) myocardial infarction ([place]) 10/27/2018  . 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 Stroke [facility])    "I've had 2; most recent one was ~ 2015 or before; affected balance" (06/29/2018)  . Thalassemia minor   . Type II diabetes mellitus ([place])   . Vitamin D deficiency     Patient Active Problem List   Diagnosis Date Noted  . Intertrigo 03/05/2019  . Type 2 diabetes mellitus with  hyperglycemia, with long-term current use of insulin ([place]) 11/17/2018  . S/P coronary artery stent placement   . Acute systolic heart failure ([place])   . Hyperlipidemia LDL goal <70   . Intractable nausea and vomiting 10/26/2018  . Esophagitis determined by endoscopy   . Dysphagia 07/24/2018  . Odynophagia 07/23/2018  . Orthostatic hypotension 07/22/2018  . Tachycardia 07/22/2018  . Hepatic steatosis   . Elevated transaminase level 07/21/2018  . Mild renal insufficiency 07/21/2018  . Leukocytosis 07/21/2018  . Bipolar depression ([place]) 07/21/2018  . Incidental lung nodule 07/21/2018  . Extravasation accident, initial encounter 07/21/2018  . Prolonged QT interval 07/21/2018  . LFTs abnormal   . Hyperlipidemia 05/14/2016  . Hypothyroidism 05/14/2016  . Essential hypertension 05/14/2016    Past Surgical History:  Procedure Laterality Date  . ABDOMINAL HYSTERECTOMY  2015   "still have 1 ovary"  . BIOPSY  07/25/2018   Procedure: BIOPSY;  Surgeon: Thornton Park, MD;  Location: [facility] ENDOSCOPY;  Service: Gastroenterology;;  . [place]; 1996  . CORONARY STENT INTERVENTION N/A 10/27/2018   Procedure: CORONARY STENT INTERVENTION;  Surgeon: Sherren Mocha, MD;  Location: [facility] CV LAB;  Service: Cardiovascular;  Laterality: N/A;  . ESOPHAGOGASTRODUODENOSCOPY (EGD) WITH PROPOFOL N/A 07/25/2018   Procedure: ESOPHAGOGASTRODUODENOSCOPY (EGD) WITH PROPOFOL;  Surgeon: Thornton Park, MD;  Location: [facility];  Service: Gastroenterology;  Laterality: N/A;  . KNEE RECONSTRUCTION Left 1980  .  LAPAROSCOPIC CHOLECYSTECTOMY    . LEFT HEART CATH AND CORONARY ANGIOGRAPHY N/A 10/27/2018   Procedure: LEFT HEART CATH AND CORONARY ANGIOGRAPHY;  Surgeon: Sherren Mocha, MD;  Location: [facility] CV LAB;  Service: Cardiovascular;  Laterality: N/A;  . SUBCLAVIAN STENT PLACEMENT Left 2010  . TONSILLECTOMY  1970     OB History    Gravida  6   Para  2   Term  2   Preterm       AB  4   Living  2     SAB  4   TAB      Ectopic      Multiple      Live Births               Home Medications    Prior to Admission medications   Medication Sig Start Date End Date Taking? Authorizing Provider  Accu-Chek FastClix Lancets MISC 1 Package by Does not apply route 3 (three) times daily. 11/17/18   Shamleffer, Melanie Crazier, MD  [facility] 115-21 MCG/ACT inhaler Inhale 1 puff into the lungs 2 (two) times daily. 04/05/19   Shirley, [place], DO  aspirin 81 MG tablet Take 1 tablet (81 mg total) by mouth daily. 10/31/18   Regalado, Belkys A, MD  atorvastatin (LIPITOR) 80 MG tablet Take 1 tablet (80 mg total) by mouth daily. 04/03/19   Josue Hector, MD  Blood Glucose Monitoring Suppl (CONTOUR NEXT ONE) KIT 1 kit by Does not apply route as directed. 07/02/18   Patrecia Pour, MD  butalbital-acetaminophen-caffeine (FIORICET, ESGIC) [phone] MG tablet Take 1-2 tablets by mouth every 6 (six) hours as needed for headache or migraine. 07/26/18 07/26/19  Rai, Ripudeep Raliegh Ip, MD  Cholecalciferol (VITAMIN D3) 50 MCG (2000 UT) TABS Take 2,000-4,000 Units by mouth See admin instructions. Take 2 tablets by mouth once a day on Mon/Wed/Fri and take 1 tablet on Sun/Tues/Thurs/Sat    [provider]  EPINEPHrine 0.3 mg/0.3 mL IJ SOAJ injection Inject 0.3 mg into the muscle once as needed (for an anaphylactic reaction).  02/08/18   [provider]  FLUoxetine (PROZAC) 20 MG tablet Take 20 mg by mouth daily.     [provider]  furosemide (LASIX) 20 MG tablet Take 1 tablet (20 mg total) by mouth 2 (two) times daily. 12/11/18   Josue Hector, MD  GLUCAGON EMERGENCY 1 MG injection Inject 1 mg into the vein once as needed (FOR ONSET OF HYPOGLYCEMIA).  02/09/18   [provider]  glucose blood (CONTOUR NEXT TEST) test strip Use as instructed 01/30/19   Shamleffer, Melanie Crazier, MD  HYDROcodone-acetaminophen (NORCO) 5-325 MG tablet Take 1 tablet by mouth every 4  (four) hours as needed for severe pain. 03/14/19   Sherwood Gambler, MD  Insulin Human (INSULIN PUMP) SOLN Inject into the skin continuous. Humalog    [provider]  lamoTRIgine (LAMICTAL) 150 MG tablet Take 150 mg by mouth daily. 11/29/18   [provider]  levothyroxine (SYNTHROID, LEVOTHROID) 150 MCG tablet Take 150 mcg by mouth daily.     [provider]  losartan (COZAAR) 100 MG tablet Take 1 tablet (100 mg total) by mouth daily. 04/03/19   Josue Hector, MD  Lurasidone HCl (LATUDA) 60 MG TABS Take 60 mg by mouth daily.    [provider]  metoprolol tartrate (LOPRESSOR) 50 MG tablet Take 1 tablet (50 mg total) by mouth 2 (two) times daily. 04/04/19   Josue Hector,  MD  Multiple Vitamin (MULTIVITAMIN WITH MINERALS) TABS tablet Take 1 tablet by mouth daily.    [provider]  nitroGLYCERIN (NITROSTAT) 0.4 MG SL tablet Place 1 tablet (0.4 mg total) under the tongue every 5 (five) minutes x 3 doses as needed for chest pain. 07/26/18   Rai, Vernelle Emerald, MD  nystatin (MYCOSTATIN/NYSTOP) powder Apply topically as needed. 02/27/19   Shirley, [place], DO  nystatin ointment (MYCOSTATIN) Apply 1 application topically 2 (two) times daily. 02/27/19   Shirley, [place], DO  pantoprazole (PROTONIX) 40 MG tablet Take 1 tablet (40 mg total) by mouth 2 (two) times daily before a meal. X 8 weeks 07/26/18   Rai, Ripudeep K, MD  potassium chloride (K-DUR) 10 MEQ tablet Take 1 tablet (10 mEq total) by mouth 2 (two) times daily. 04/04/19   Josue Hector, MD  Semaglutide,0.25 or 0.5MG/DOS, (OZEMPIC, 0.25 OR 0.5 MG/DOSE,) 2 MG/1.5ML SOPN Inject 0.25 mg into the skin once a week. 01/22/19   Shamleffer, Melanie Crazier, MD  ticagrelor (BRILINTA) 90 MG TABS tablet Take 1 tablet (90 mg total) by mouth 2 (two) times daily. 04/04/19   Josue Hector, MD  traMADol (ULTRAM) 50 MG tablet Take 0.5 tablets (25 mg total) by mouth every 12 (twelve) hours as needed for moderate pain. 10/31/18    Regalado, Belkys A, MD  VENTOLIN HFA 108 (90 Base) MCG/ACT inhaler Inhale 2 puffs into the lungs 2 (two) times daily. 04/05/19   Shirley, [place], DO    Family History Family History  Problem Relation Age of Onset  . Diabetes Mother   . Hypertension Mother   . Hyperlipidemia Mother   . CAD Mother   . Cervical cancer Mother   . CAD Father   . Heart failure Father   . Testicular cancer Father   . Hypertension Sister   . Hyperlipidemia Sister   . Hypertension Brother   . Hyperlipidemia Brother     Social History Social History   Tobacco Use  . Smoking status: Former Smoker    Packs/day: 2.00    Years: 36.00    Pack years: 72.00    Types: Cigarettes    Quit date: 05/14/2014    Years since quitting: 4.9  . Smokeless tobacco: Never Used  Substance Use Topics  . Alcohol use: Not Currently  . Drug use: Not Currently     Allergies   Ilosone [erythromycin], Keflex [cephalexin], Penicillins, Iron, Metformin and related, and Morphine and related   Review of Systems Review of Systems  Gastrointestinal: Positive for nausea and vomiting.  Endocrine: Positive for polydipsia.  Neurological: Positive for weakness.  All other systems reviewed and are negative.    Physical Exam Updated Vital Signs BP 95/81   Pulse (!) 124   Temp (!) 97.3 F (36.3 C) (Rectal)   Resp (!) 21   Ht 5' 2.5" (1.588 m)   Wt 129.3 kg   LMP 08/03/2013   SpO2 98%   BMI 51.30 kg/m   Physical Exam Vitals signs and nursing note reviewed.  Constitutional:      Appearance: She is well-developed. She is ill-appearing.     Comments: Appears pale and ill.  Slightly confused  HENT:     Head: Normocephalic and atraumatic.  Eyes:     Conjunctiva/sclera: Conjunctivae normal.     Pupils: Pupils are equal, round, and reactive to light.  Neck:     Musculoskeletal: Normal range of motion and neck supple.  Cardiovascular:     Rate and  Rhythm: Regular rhythm. Tachycardia present.     Pulses: Normal  pulses.     Comments: Tachycardic around 125 Pulmonary:     Effort: Pulmonary effort is normal. No respiratory distress.     Breath sounds: Normal breath sounds. No wheezing.     Comments: Clear lung sounds Abdominal:     General: There is no distension.     Palpations: Abdomen is soft. There is no mass.     Tenderness: There is no abdominal tenderness. There is no guarding or rebound.     Comments: No ttp of the abd  Musculoskeletal: Normal range of motion.     Comments: Left leg and walking boot.  No obvious swelling, erythema, or tenderness.  No edema of the right leg.  Skin:    General: Skin is warm and dry.     Capillary Refill: Capillary refill takes less than 2 seconds.  Neurological:     Mental Status: She is confused.     GCS: GCS eye subscore is 4. GCS verbal subscore is 4. GCS motor subscore is 6.     Comments: Alert to person and place.  Occasionally answers are inappropriate/confused      ED Treatments / Results  Labs (all labs ordered are listed, but only abnormal results are displayed) Labs Reviewed  LACTIC ACID, PLASMA - Abnormal; Notable for the following components:      Result Value   Lactic Acid, Venous 2.6 (*)    All other components within normal limits  COMPREHENSIVE METABOLIC PANEL - Abnormal; Notable for the following components:   Sodium 126 (*)    Chloride 84 (*)    CO2 8 (*)    Glucose, Bld 870 (*)    BUN 31 (*)    Creatinine, Ser 2.15 (*)    Total Bilirubin 2.5 (*)    GFR calc non Af Amer 25 (*)    GFR calc Af Amer 29 (*)    Anion gap 34 (*)    All other components within normal limits  CBC WITH DIFFERENTIAL/PLATELET - Abnormal; Notable for the following components:   WBC 22.4 (*)    RBC 7.49 (*)    Hemoglobin 15.7 (*)    HCT 50.5 (*)    MCV 67.4 (*)    MCH 21.0 (*)    RDW 18.8 (*)    Platelets 449 (*)    Neutro Abs 21.1 (*)    Basophils Absolute 0.2 (*)    nRBC 1 (*)    All other components within normal limits  URINALYSIS,  ROUTINE W REFLEX MICROSCOPIC - Abnormal; Notable for the following components:   Color, Urine STRAW (*)    Glucose, UA >=500 (*)    Ketones, ur 80 (*)    All other components within normal limits  D-DIMER, QUANTITATIVE (NOT AT [facility]) - Abnormal; Notable for the following components:   D-Dimer, Quant 0.51 (*)    All other components within normal limits  LIPASE, BLOOD - Abnormal; Notable for the following components:   Lipase 189 (*)    All other components within normal limits  CBG MONITORING, ED - Abnormal; Notable for the following components:   Glucose-Capillary >600 (*)    All other components within normal limits  I-STAT BETA HCG BLOOD, ED ([facility], [facility], [facility] ONLY) - Abnormal; Notable for the following components:   I-stat hCG, quantitative 9.8 (*)    All other components within normal limits  POCT I-STAT EG7 - Abnormal; Notable for the following components:  pH, Ven 7.188 (*)    pCO2, Ven 25.2 (*)    pO2, Ven 61.0 (*)    Bicarbonate 9.6 (*)    TCO2 10 (*)    Acid-base deficit 17.0 (*)    Sodium 125 (*)    Calcium, Ion 1.06 (*)    HCT 55.0 (*)    Hemoglobin 18.7 (*)    All other components within normal limits  CBG MONITORING, ED - Abnormal; Notable for the following components:   Glucose-Capillary >600 (*)    All other components within normal limits  URINE CULTURE  SARS CORONAVIRUS 2 (TAT 6-24 HRS)  LACTIC ACID, PLASMA  I-STAT VENOUS BLOOD GAS, ED    EKG EKG Interpretation  Date/Time:  Saturday May 12 2019 17:19:31 EDT Ventricular Rate:  130 PR Interval:    QRS Duration: 89 QT Interval:  318 QTC Calculation: 468 R Axis:   63 Text Interpretation:  Sinus tachycardia rate is faster compared to Mar 2020 otherwise similar Confirmed by Sherwood Gambler [phone]) on 05/12/2019 5:32:30 PM   Radiology Dg Chest Port 1 View  Result Date: 05/12/2019 CLINICAL DATA:  Cp and sob EXAM: PORTABLE CHEST 1 VIEW COMPARISON:  Chest radiograph 10/27/2018 FINDINGS: Stable  cardiomediastinal contours with enlarged heart size. Left subclavian stent noted. Mild central vascular congestion without overt edema. Low lung volumes. No focal infiltrate to suggest infection. No pneumothorax or large pleural effusion. No acute finding in the visualized skeleton. IMPRESSION: Central vascular congestion without overt edema. No evidence of infection. Electronically Signed   By: Audie Pinto M.D.   On: 05/12/2019 17:52    Procedures .Critical Care Performed by: Franchot Heidelberg, PA-C Authorized by: Franchot Heidelberg, PA-C   Critical care provider statement:    Critical care time (minutes):  45   Critical care time was exclusive of:  Separately billable procedures and treating other patients and teaching time   Critical care was necessary to treat or prevent imminent or life-threatening deterioration of the following conditions:  Metabolic crisis   Critical care was time spent personally by me on the following activities:  Blood draw for specimens, development of treatment plan with patient or surrogate, evaluation of patient's response to treatment, examination of patient, obtaining history from patient or surrogate, ordering and performing treatments and interventions, ordering and review of laboratory studies, ordering and review of radiographic studies, re-evaluation of patient's condition, pulse oximetry and review of old charts   I assumed direction of critical care for this patient from another provider in my specialty: no   Comments:     Patient with DKA, insulin drip and fluids started.  Patient admitted.   (including critical care time)  Medications Ordered in ED Medications  dextrose 5 %-0.45 % sodium chloride infusion (has no administration in time range)  insulin regular, human (MYXREDLIN) 100 units/ 100 mL infusion (5.4 Units/hr Intravenous New Bag/Given 05/12/19 1847)  ondansetron (ZOFRAN) injection 4 mg (4 mg Intravenous Given 05/12/19 1755)  sodium  chloride 0.9 % bolus 1,000 mL (0 mLs Intravenous Stopped 05/12/19 1829)  sodium chloride 0.9 % bolus 1,000 mL (1,000 mLs Intravenous New Bag/Given 05/12/19 1828)     Initial Impression / Assessment and Plan / ED Course  I have reviewed the triage vital signs and the nursing notes.  Pertinent labs & imaging results that were available during my care of the patient were reviewed by me and considered in my medical decision making (see chart for details).        Patient resenting  for evaluation of weakness, nausea, vomiting.  Physical exam shows patient who appears ill.  She is confused and tachycardic.  Has been having nausea and vomiting.  Consider infectious source/sepsis.  However, no documented temperature, besides heart rate patient does not meet sirs criteria.  Also consider PE as patient has been immobilized in the left lower leg.  Consider hyperglycemia, will obtain labs and urine.  1 L bolus started.  Labs show hyperglycemia at 870.  Bicarb low at 9, gap of 34.  80 ketones in the urine, patient is in DKA.  Potassium is normal at 5.1.  Will give second liter of fluid and start insulin drip.  VBG ordered.  Leukocytosis at 22.4 and lactic slightly up at 2.4, this is likely due to DKA.  No obvious infectious source.  Chest x-ray viewed interpreted by me, no pneumonia, pneumothorax, effusion.  Per radiologist read, there is slight congestion, however benefits of giving fluid outweighs concerns for CHF.  Of note, patient's lipase is up at 189, likely due to DKA.  Due to creatinine, patient is unable to receive contrast, will hold on any further chest or abdominal imaging.  Case discussed with attending, Dr. Regenia Skeeter agrees to plan.  VBG shows acidosis of 7.188.  Patient remains alert with slight confusion.  Will call for admission.  Discussed with family practice, patient to be admitted.  Final Clinical Impressions(s) / ED Diagnoses   Final diagnoses:  Diabetic ketoacidosis without coma  associated with type 2 diabetes mellitus ([place])  Acute pancreatitis, unspecified complication status, unspecified pancreatitis type    ED Discharge Orders    None       Franchot Heidelberg, PA-C 05/12/19 1931    Sherwood Gambler, MD 05/13/19 1709

## 2019-05-13 ENCOUNTER — Encounter (HOSPITAL_COMMUNITY): Payer: Self-pay | Admitting: *Deleted

## 2019-05-13 DIAGNOSIS — N179 Acute kidney failure, unspecified: Secondary | ICD-10-CM | POA: Diagnosis not present

## 2019-05-13 DIAGNOSIS — E1110 Type 2 diabetes mellitus with ketoacidosis without coma: Secondary | ICD-10-CM | POA: Diagnosis not present

## 2019-05-13 DIAGNOSIS — D751 Secondary polycythemia: Secondary | ICD-10-CM | POA: Diagnosis not present

## 2019-05-13 DIAGNOSIS — D72829 Elevated white blood cell count, unspecified: Secondary | ICD-10-CM | POA: Diagnosis not present

## 2019-05-13 LAB — BASIC METABOLIC PANEL
Anion gap: 11 (ref 5–15)
Anion gap: 18 — ABNORMAL HIGH (ref 5–15)
Anion gap: 19 — ABNORMAL HIGH (ref 5–15)
Anion gap: 9 (ref 5–15)
Anion gap: 9 (ref 5–15)
BUN: 14 mg/dL (ref 6–20)
BUN: 15 mg/dL (ref 6–20)
BUN: 19 mg/dL (ref 6–20)
BUN: 24 mg/dL — ABNORMAL HIGH (ref 6–20)
BUN: 26 mg/dL — ABNORMAL HIGH (ref 6–20)
CO2: 12 mmol/L — ABNORMAL LOW (ref 22–32)
CO2: 14 mmol/L — ABNORMAL LOW (ref 22–32)
CO2: 16 mmol/L — ABNORMAL LOW (ref 22–32)
CO2: 16 mmol/L — ABNORMAL LOW (ref 22–32)
CO2: 17 mmol/L — ABNORMAL LOW (ref 22–32)
Calcium: 8.5 mg/dL — ABNORMAL LOW (ref 8.9–10.3)
Calcium: 8.5 mg/dL — ABNORMAL LOW (ref 8.9–10.3)
Calcium: 8.5 mg/dL — ABNORMAL LOW (ref 8.9–10.3)
Calcium: 8.6 mg/dL — ABNORMAL LOW (ref 8.9–10.3)
Calcium: 8.7 mg/dL — ABNORMAL LOW (ref 8.9–10.3)
Chloride: 103 mmol/L (ref 98–111)
Chloride: 104 mmol/L (ref 98–111)
Chloride: 107 mmol/L (ref 98–111)
Chloride: 108 mmol/L (ref 98–111)
Chloride: 108 mmol/L (ref 98–111)
Creatinine, Ser: 0.73 mg/dL (ref 0.44–1.00)
Creatinine, Ser: 0.75 mg/dL (ref 0.44–1.00)
Creatinine, Ser: 0.93 mg/dL (ref 0.44–1.00)
Creatinine, Ser: 1.19 mg/dL — ABNORMAL HIGH (ref 0.44–1.00)
Creatinine, Ser: 1.51 mg/dL — ABNORMAL HIGH (ref 0.44–1.00)
GFR calc Af Amer: 45 mL/min — ABNORMAL LOW (ref >60)
GFR calc Af Amer: 60 mL/min — ABNORMAL LOW (ref >60)
GFR calc Af Amer: >60 mL/min (ref >60)
GFR calc Af Amer: >60 mL/min (ref >60)
GFR calc Af Amer: >60 mL/min (ref >60)
GFR calc non Af Amer: 39 mL/min — ABNORMAL LOW (ref >60)
GFR calc non Af Amer: 51 mL/min — ABNORMAL LOW (ref >60)
GFR calc non Af Amer: >60 mL/min (ref >60)
GFR calc non Af Amer: >60 mL/min (ref >60)
GFR calc non Af Amer: >60 mL/min (ref >60)
Glucose, Bld: 155 mg/dL — ABNORMAL HIGH (ref 70–99)
Glucose, Bld: 178 mg/dL — ABNORMAL HIGH (ref 70–99)
Glucose, Bld: 190 mg/dL — ABNORMAL HIGH (ref 70–99)
Glucose, Bld: 268 mg/dL — ABNORMAL HIGH (ref 70–99)
Glucose, Bld: 380 mg/dL — ABNORMAL HIGH (ref 70–99)
Potassium: 3.4 mmol/L — ABNORMAL LOW (ref 3.5–5.1)
Potassium: 4 mmol/L (ref 3.5–5.1)
Potassium: 4.1 mmol/L (ref 3.5–5.1)
Potassium: 4.3 mmol/L (ref 3.5–5.1)
Potassium: 4.5 mmol/L (ref 3.5–5.1)
Sodium: 133 mmol/L — ABNORMAL LOW (ref 135–145)
Sodium: 133 mmol/L — ABNORMAL LOW (ref 135–145)
Sodium: 134 mmol/L — ABNORMAL LOW (ref 135–145)
Sodium: 135 mmol/L (ref 135–145)
Sodium: 136 mmol/L (ref 135–145)

## 2019-05-13 LAB — GLUCOSE, CAPILLARY
Glucose-Capillary: 126 mg/dL — ABNORMAL HIGH (ref 70–99)
Glucose-Capillary: 156 mg/dL — ABNORMAL HIGH (ref 70–99)
Glucose-Capillary: 157 mg/dL — ABNORMAL HIGH (ref 70–99)
Glucose-Capillary: 170 mg/dL — ABNORMAL HIGH (ref 70–99)
Glucose-Capillary: 174 mg/dL — ABNORMAL HIGH (ref 70–99)
Glucose-Capillary: 194 mg/dL — ABNORMAL HIGH (ref 70–99)
Glucose-Capillary: 207 mg/dL — ABNORMAL HIGH (ref 70–99)
Glucose-Capillary: 209 mg/dL — ABNORMAL HIGH (ref 70–99)
Glucose-Capillary: 212 mg/dL — ABNORMAL HIGH (ref 70–99)
Glucose-Capillary: 219 mg/dL — ABNORMAL HIGH (ref 70–99)
Glucose-Capillary: 241 mg/dL — ABNORMAL HIGH (ref 70–99)
Glucose-Capillary: 401 mg/dL — ABNORMAL HIGH (ref 70–99)
Glucose-Capillary: 416 mg/dL — ABNORMAL HIGH (ref 70–99)

## 2019-05-13 LAB — CBC WITH DIFFERENTIAL/PLATELET
Abs Immature Granulocytes: 0.11 10*3/uL — ABNORMAL HIGH (ref 0.00–0.07)
Basophils Absolute: 0.1 10*3/uL (ref 0.0–0.1)
Basophils Relative: 0 %
Eosinophils Absolute: 0 10*3/uL (ref 0.0–0.5)
Eosinophils Relative: 0 %
HCT: 47.2 % — ABNORMAL HIGH (ref 36.0–46.0)
Hemoglobin: 14.7 g/dL (ref 12.0–15.0)
Immature Granulocytes: 1 %
Lymphocytes Relative: 13 %
Lymphs Abs: 3 10*3/uL (ref 0.7–4.0)
MCH: 20.4 pg — ABNORMAL LOW (ref 26.0–34.0)
MCHC: 31.1 g/dL (ref 30.0–36.0)
MCV: 65.6 fL — ABNORMAL LOW (ref 80.0–100.0)
Monocytes Absolute: 3.1 10*3/uL — ABNORMAL HIGH (ref 0.1–1.0)
Monocytes Relative: 13 %
Neutro Abs: 16.9 10*3/uL — ABNORMAL HIGH (ref 1.7–7.7)
Neutrophils Relative %: 73 %
Platelets: 342 10*3/uL (ref 150–400)
RBC: 7.19 MIL/uL — ABNORMAL HIGH (ref 3.87–5.11)
RDW: 18 % — ABNORMAL HIGH (ref 11.5–15.5)
WBC: 23.1 10*3/uL — ABNORMAL HIGH (ref 4.0–10.5)
nRBC: 0 % (ref 0.0–0.2)

## 2019-05-13 LAB — URINE CULTURE: Culture: NO GROWTH

## 2019-05-13 LAB — SARS CORONAVIRUS 2 (TAT 6-24 HRS): SARS Coronavirus 2: NEGATIVE

## 2019-05-13 LAB — CBG MONITORING, ED
Glucose-Capillary: 277 mg/dL — ABNORMAL HIGH (ref 70–99)
Glucose-Capillary: 352 mg/dL — ABNORMAL HIGH (ref 70–99)

## 2019-05-13 LAB — MRSA PCR SCREENING: MRSA by PCR: NEGATIVE

## 2019-05-13 LAB — PREGNANCY, URINE: Preg Test, Ur: NEGATIVE

## 2019-05-13 MED ORDER — POTASSIUM CHLORIDE 10 MEQ/100ML IV SOLN
10.0000 meq | INTRAVENOUS | Status: AC
  Administered 2019-05-13 (×4): 10 meq via INTRAVENOUS
  Filled 2019-05-13 (×4): qty 100

## 2019-05-13 MED ORDER — ACETAMINOPHEN 325 MG PO TABS
650.0000 mg | ORAL_TABLET | Freq: Four times a day (QID) | ORAL | Status: DC | PRN
  Administered 2019-05-13 – 2019-05-16 (×7): 650 mg via ORAL
  Filled 2019-05-13 (×8): qty 2

## 2019-05-13 MED ORDER — INSULIN GLARGINE 100 UNIT/ML ~~LOC~~ SOLN
27.0000 [IU] | Freq: Every day | SUBCUTANEOUS | Status: DC
  Administered 2019-05-13 – 2019-05-14 (×2): 27 [IU] via SUBCUTANEOUS
  Filled 2019-05-13 (×2): qty 0.27

## 2019-05-13 MED ORDER — TRAMADOL HCL 50 MG PO TABS
25.0000 mg | ORAL_TABLET | Freq: Once | ORAL | Status: AC
  Administered 2019-05-13: 25 mg via ORAL
  Filled 2019-05-13: qty 1

## 2019-05-13 MED ORDER — POTASSIUM CHLORIDE 10 MEQ/100ML IV SOLN
10.0000 meq | INTRAVENOUS | Status: AC
  Administered 2019-05-13 (×2): 10 meq via INTRAVENOUS
  Filled 2019-05-13: qty 100

## 2019-05-13 MED ORDER — LOSARTAN POTASSIUM 50 MG PO TABS
100.0000 mg | ORAL_TABLET | Freq: Every day | ORAL | Status: DC
  Administered 2019-05-13 – 2019-05-16 (×4): 100 mg via ORAL
  Filled 2019-05-13 (×4): qty 2

## 2019-05-13 MED ORDER — METOPROLOL TARTRATE 50 MG PO TABS
50.0000 mg | ORAL_TABLET | Freq: Two times a day (BID) | ORAL | Status: DC
  Administered 2019-05-13 – 2019-05-16 (×7): 50 mg via ORAL
  Filled 2019-05-13 (×7): qty 1

## 2019-05-13 MED ORDER — INSULIN ASPART 100 UNIT/ML ~~LOC~~ SOLN
0.0000 [IU] | Freq: Three times a day (TID) | SUBCUTANEOUS | Status: DC
  Administered 2019-05-13: 3 [IU] via SUBCUTANEOUS
  Administered 2019-05-14: 8 [IU] via SUBCUTANEOUS

## 2019-05-13 MED ORDER — POTASSIUM CHLORIDE 10 MEQ/100ML IV SOLN
10.0000 meq | INTRAVENOUS | Status: AC
  Administered 2019-05-13 (×2): 10 meq via INTRAVENOUS
  Filled 2019-05-13 (×2): qty 100

## 2019-05-13 MED ORDER — INSULIN ASPART 100 UNIT/ML ~~LOC~~ SOLN
15.0000 [IU] | Freq: Once | SUBCUTANEOUS | Status: AC
  Administered 2019-05-14: 15 [IU] via SUBCUTANEOUS

## 2019-05-13 MED ORDER — SODIUM CHLORIDE 0.9 % IV SOLN
INTRAVENOUS | Status: DC
  Administered 2019-05-13 – 2019-05-15 (×4): via INTRAVENOUS

## 2019-05-13 NOTE — Progress Notes (Signed)
Paged resident. Patient's CGB 527 then 401. Pt requested Tramadol for headache.

## 2019-05-13 NOTE — Progress Notes (Signed)
MD looked her site and we are predicting it is related with heparin sq injection, marked the site, will watch closely.

## 2019-05-13 NOTE — Discharge Summary (Signed)
[facility] Discharge Summary  Patient name: Morgan [name] Medical record number: [id number] Date of birth: [birth year] Age: 53 y.o. Gender: female Date of Admission: 05/12/2019  Date of Discharge: 05/16/19 Admitting Physician: Kinnie Feil, MD  Primary Care Provider: Shirley, [place], DO Consultants: DM Coordinator   Indication for Hospitalization: DKA, AKI and mild QTc prolongation   Discharge Diagnoses/Problem List:  Active Problems:   Hypokalemia   Nausea   Type 2 diabetes mellitus without complication, with long-term current use of insulin ([place])   DKA (diabetic ketoacidoses) ([place])   Type 1 diabetes mellitus with hyperglycemia (H[facility])   Chronic nonintractable headache  Disposition: discharge home with endocrinology follow up   Discharge Condition: stable and improved   Discharge Exam:   General: female, in NAD sitting up in bed  Cardiovascular: RRR without murmurs or gallops  Respiratory: CTAB without wheezing or crackles, stable on RA  Abdomen: still with right abdominal hematoma, tender in the area of the hematoma, bowel sounds present throughout, no crepitus in area of hematoma  Extremities: trace LE edema  Brief Hospital Course:  Morgan [name] is a 53 y.o. female who presented with nausea and emesis with elevated blood glucose of >800 and found to be in DKA. Patient reported a fall that caused her to have an injury at the site of insertion, so she did not replace the insulin pump.She subsequently developed worsening nausea and emesis. Of note, patient's hemoglobin A1c was 12.6. At the time of admission, patient had an anion gap of 33 with pH of 7.2,lipase of 189, BUN 31 and  sodium of 125 and an AKI with Cr of 2.15. Patient was admitted and started on insulin drip, IVFs, and given zofran and later compazine for nausea. Following this her anion gap closed by day 2 of hospitalization and she was transitioned to subcutaneous insulin. Patient had  multiple episodes of hypokalemia, for which she was given oral potassium. Also important to note, that patient At the time of discharge, her K was 3.3. During admission, she had an elevated beta hCG level with negative urine pregnancy test. Patient was recommended to follow up for this at PCP appointment after discharge. At the time of discharge, she received 30 units of long acting insulin BID and was scheduled for a next day appointment to follow up with her endocrinologist for management of her insulin pump. Blood glucose was 270 on the morning of discharge.   Issues for Follow Up:  1. Consider work-up for elevated serum beta hCG.  Urine pregnancy test negative. 2. Recommend BMP to check potassium levels as she was hypokalemic during admission.  3. Confirm patient's insulin pump dosing. Recommend checking blood glucose with above recommended BMP or POCT blood glucose.   Significant Procedures:  Insulin gtt   Significant Labs and Imaging:  Recent Labs  Lab 05/13/19 0307 05/14/19 0309 05/15/19 0543  WBC 23.1* 17.3* 10.3  HGB 14.7 11.9* 11.4*  HCT 47.2* 36.6 35.2*  PLT 342 236 201   Recent Labs  Lab 05/12/19 1736  05/13/19 1540 05/14/19 0309 05/15/19 0543 05/16/19 0715 05/16/19 1444  NA 126*   < > 133* 132* 137 136 135  K 5.1   < > 4.5 3.7 2.7* 2.7* 3.3*  CL 84*   < > 108 106 108 103 103  CO2 8*   < > 16* 16* 18* 20* 19*  GLUCOSE 870*   < > 178* 332* 290* 249* 310*  BUN 31*   < >  14 10 5* 5* 9  CREATININE 2.15*   < > 0.73 0.68 0.59 0.52 0.79  CALCIUM 9.1   < > 8.5* 8.2* 8.1* 8.9 9.1  MG  --   --   --   --   --   --  1.6*  ALKPHOS 121  --   --   --   --   --   --   AST 17  --   --   --   --   --   --   ALT 25  --   --   --   --   --   --   ALBUMIN 4.5  --   --   --   --   --   --    < > = values in this interval not displayed.    Results/Tests Pending at Time of Discharge:   Re-check of patient's potassium showed improvement from 2.7 to 3.3 following 11mq of PO  potassium.   Discharge Medications:  Allergies as of 05/16/2019      Reactions   Ilosone [erythromycin] Anaphylaxis   Keflex [cephalexin] Anaphylaxis   Penicillins Anaphylaxis   Has patient had a PCN reaction causing immediate rash, facial/tongue/throat swelling, SOB or lightheadedness with hypotension: YES Has patient had a PCN reaction causing severe rash involving mucus membranes or skin necrosis: NO Has patient had a PCN reaction that required hospitalizationNO Has patient had a PCN reaction occurring within the last 10 years: NO If all of the above answers are "NO", then may proceed with Cephalosporin use.   Iron Other (See Comments)   Pt reports condition limiting Iron intake.   Metformin And Related Other (See Comments)   Pt prefers to not take this medication   Morphine And Related Other (See Comments)   Pt reports hallucinations      Medication List    STOP taking these medications   insulin pump Soln   Ozempic (0.25 or 0.5 MG/DOSE) 2 MG/1.5ML Sopn Generic drug: Semaglutide(0.25 or 0.5MG/DOS)     TAKE these medications   Accu-Chek FastClix Lancets Misc 1 Package by Does not apply route 3 (three) times daily.   Advair HFA 115-21 MCG/ACT inhaler Generic drug: fluticasone-salmeterol Inhale 1 puff into the lungs 2 (two) times daily.   aspirin 81 MG tablet Take 1 tablet (81 mg total) by mouth daily.   atorvastatin 80 MG tablet Commonly known as: LIPITOR Take 1 tablet (80 mg total) by mouth daily.   butalbital-acetaminophen-caffeine 50-325-40 MG tablet Commonly known as: FIORICET Take 1-2 tablets by mouth every 6 (six) hours as needed for headache or migraine.   Contour Next One Kit 1 kit by Does not apply route as directed.   Contour Next Test test strip Generic drug: glucose blood Use as instructed   EPINEPHrine 0.3 mg/0.3 mL Soaj injection Commonly known as: EPI-PEN Inject 0.3 mg into the muscle once as needed (for an anaphylactic reaction).    FLUoxetine 20 MG tablet Commonly known as: PROZAC Take 1 tablet (20 mg total) by mouth daily. What changed: Another medication with the same name was removed. Continue taking this medication, and follow the directions you see here.   furosemide 20 MG tablet Commonly known as: LASIX Take 1 tablet (20 mg total) by mouth 2 (two) times daily.   Glucagon Emergency 1 MG injection Generic drug: glucagon Inject 1 mg into the vein once as needed (FOR ONSET OF HYPOGLYCEMIA).   HYDROcodone-acetaminophen 5-325 MG tablet Commonly  known as: Norco Take 1 tablet by mouth every 4 (four) hours as needed for severe pain.   insulin aspart 100 UNIT/ML injection Commonly known as: novoLOG Inject 5 Units into the skin 3 (three) times daily with meals.   insulin glargine 100 UNIT/ML injection Commonly known as: LANTUS Inject 0.3 mLs (30 Units total) into the skin 2 (two) times daily.   lamoTRIgine 150 MG tablet Commonly known as: LAMICTAL Take 150 mg by mouth daily.   Latuda 60 MG Tabs Generic drug: Lurasidone HCl Take 60 mg by mouth daily.   levothyroxine 150 MCG tablet Commonly known as: SYNTHROID Take 150 mcg by mouth daily.   losartan 100 MG tablet Commonly known as: COZAAR Take 1 tablet (100 mg total) by mouth daily.   metoprolol tartrate 50 MG tablet Commonly known as: LOPRESSOR Take 1 tablet (50 mg total) by mouth 2 (two) times daily.   multivitamin with minerals Tabs tablet Take 1 tablet by mouth daily.   nitroGLYCERIN 0.4 MG SL tablet Commonly known as: NITROSTAT Place 1 tablet (0.4 mg total) under the tongue every 5 (five) minutes x 3 doses as needed for chest pain.   nystatin powder Commonly known as: MYCOSTATIN/NYSTOP Apply topically as needed. What changed:   how much to take  when to take this  reasons to take this  Another medication with the same name was removed. Continue taking this medication, and follow the directions you see here.   pantoprazole 40 MG  tablet Commonly known as: PROTONIX Take 1 tablet (40 mg total) by mouth 2 (two) times daily before a meal. X 8 weeks   potassium chloride 10 MEQ tablet Commonly known as: KLOR-CON Take 1 tablet (10 mEq total) by mouth 2 (two) times daily.   ticagrelor 90 MG Tabs tablet Commonly known as: BRILINTA Take 1 tablet (90 mg total) by mouth 2 (two) times daily.   traMADol 50 MG tablet Commonly known as: ULTRAM Take 0.5 tablets (25 mg total) by mouth every 12 (twelve) hours as needed for moderate pain.   Ventolin HFA 108 (90 Base) MCG/ACT inhaler Generic drug: albuterol Inhale 2 puffs into the lungs 2 (two) times daily.   Vitamin D3 50 MCG (2000 UT) Tabs Take 2,000-4,000 Units by mouth See admin instructions. Take 2 tablets by mouth once a day on Mon/Wed/Fri and take 1 tablet on Sun/Tues/Thurs/Sat       Discharge Instructions: Please refer to Patient Instructions section of EMR for full details.  Patient was counseled important signs and symptoms that should prompt return to medical care, changes in medications, dietary instructions, activity restrictions, and follow up appointments.   Follow-Up Appointments: Follow-up [place] Follow up on 05/18/2019.   Why: @ 11:25am Contact information: [place]       [facility] Follow up on 05/17/2019.   Specialty: Internal Medicine Why: @ 9:50am Contact information: [address], [place] [address] [phone]          Stark Klein, MD 05/16/2019, 6:42 PM PGY-1, [place]

## 2019-05-13 NOTE — Progress Notes (Signed)
Recent anion gap 9, called MD regarding possible transition.  Pt is drowsy, tylenol given for a complain of headache, IV drip, Insulin drip is continue, will continue to monitor the patient  Palma Holter, RN

## 2019-05-13 NOTE — Plan of Care (Signed)

## 2019-05-13 NOTE — Progress Notes (Signed)
Family Medicine Teaching Service Daily Progress Note Intern Pager: [phone]  Patient name: Morgan [name] Medical record number: [id number] Date of birth: 05-18-1966 Age: 53 y.o. Gender: female  Primary Care Provider: Shirley, [place], DO Consultants: none Code Status: full  Pt Overview and Major Events to Date:  10/10 admitted to fpts  Assessment and Plan: Morgan [name] is a 53 y.o. female presenting with intractable nausea, vomiting, and fatigue.  Diabetic ketoacidosis/metabolic acidosis/nausea/vomiting Progressing well insulin drip.  CBGs now below 250 so switch to D5 half-normal saline.  Last anion gap 11, so now closed x1.  Will transition if closed on another BMP. Can transition to Lantus and sliding scale insulin afterwards.  Hemoglobin A1c still pending.  Will continue n.p.o. for now as patient still quite sleepy, but can likely advance diet later today. -Continue n.p.o. for now -D5 half-normal saline when CBG less than 250 -Insulin drip until anion gap closes -Transition to Lantus and sliding scale when gap closes -Every 4 hour BMPs -Plan to keep potassium above 5 while on insulin GTT  Drowsiness Improved significantly in regard.  Patient able to carry on a full conversation, but still subjectively very sleepy.  Expect to continue to improve as her DKA resolves.  Acute kidney injury/dehydration Creatinine 2.15 on admission.    Baseline 0.67.  Creatinine 0.9 last check. -BMP every 4 hours on insulin drip -D5 half-normal saline at 125 mL/h  Hypertension BP now elevated up to 170/70.  Will restart home Cozaar and metoprolol. -Cozaar 100 mg daily -Metoprolol 50 mg twice daily  Elevated lipase Likely secondary to diabetic ketoacidosis.  Expect to improve with fluid administration and insulin.  Could consider rechecking prior to discharge, but this is likely not necessary.  Sinus Tachycardia/atrial flutter Atrial flutter seen on initial EKG.  Repeat EKG showed sinus  tachycardia.  Blood pressure is elevated up to restart metoprolol, will expect this to continue to improve on this medication.  Elevated beta hCG Unclear if this is falsely elevated.  Will get urine hCG to to rule out possibility of heterophile antibodies causing false positive result.  If urine hCG positive would likely need investigate possibility of pregnancy or possible trophoblastic disease.  Leukocytosis/polycythemia White blood cell count 23.1 on recheck. Likely secondary to inflammation and dehydration.  Recheck CBC in a.m.  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  QTC 483 on recheck. 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  We will continue home Prozac, lamotrigine, Latuda and watch carefully.  Placed on cardiac monitoring, repeat EKG in a.m.  History of coronary artery stent placement Takes aspirin and Brilinta.  We will continue these agents while in the hospital.  Also be on heparin for DVT prophylaxis.  Monitor for any signs or symptoms of bleeding.  History of hypothyroidism Last TSH around 3.  Appears to be well controlled on Synthroid 150 mcg.  Bipolar depression Patient on Prozac 20 mg, lamotrigine 150 mg, Latuda 60 mg daily.  Patient with very mild QTC prolongation.  We will continue home psychiatric medications but will continue to monitor QTC closely.  Hyperlipidemia Last lipid panel in 2/20.  HDL 37, cholesterol 190.  Will continue atorvastatin 80 mg while in hospital.  FEN/GI: N.p.o.  PPx: Heparin  Disposition: Likely home  Subjective:  Patient more awake and alert this a.m. although still somewhat drowsy.  Has no complaints this time aside from a little bit of nausea.  No vomiting.  Objective: Temp:  [97.3 F (36.3 C)-99.1 F (37.3 C)] 98 F (  36.7 C) (10/11 0700) Pulse Rate:  [120-129] 120 (10/11 0737) Resp:  [15-27] 20 (10/11 0737) BP: (93-172)/(46-97) 172/85 (10/11 0737) SpO2:  [92 %-98 %] 95 % (10/11 0759) Weight:  [99.8 kg-129.3 kg] 99.8 kg  (10/11 0216) Physical Exam: General: Pleasant 53 year old Caucasian female, drowsy, GCS now 15 Cardiovascular: Tachycardia, regular rhythm, skin warm and dry Respiratory: Lungs clear to auscultation bilaterally, no accessory muscle use Abdomen: Soft, nontender, nondistended. Extremities: No peripheral edema noted.  Walking boot noted on left foot. Neuro: No focal neurologic deficits, GCS 15  Laboratory: Recent Labs  Lab 05/12/19 1736 05/12/19 1826 05/13/19 0307  WBC 22.4*  --  23.1*  HGB 15.7* 18.7* 14.7  HCT 50.5* 55.0* 47.2*  PLT 449*  --  342   Recent Labs  Lab 05/12/19 1736  05/12/19 2349 05/13/19 0307 05/13/19 0723  NA 126*   < > 134* 136 135  K 5.1   < > 3.4* 4.1 4.0  CL 84*  --  103 104 107  CO2 8*  --  12* 14* 17*  BUN 31*  --  26* 24* 19  CREATININE 2.15*  --  1.51* 1.19* 0.93  CALCIUM 9.1  --  8.5* 8.7* 8.5*  PROT 8.0  --   --   --   --   BILITOT 2.5*  --   --   --   --   ALKPHOS 121  --   --   --   --   ALT 25  --   --   --   --   AST 17  --   --   --   --   GLUCOSE 870*  --  380* 268* 190*   < > = values in this interval not displayed.    Imaging/Diagnostic Tests: CLINICAL DATA:  Cp and sob  EXAM: PORTABLE CHEST 1 VIEW  COMPARISON:  Chest radiograph 10/27/2018  FINDINGS: Stable cardiomediastinal contours with enlarged heart size. Left subclavian stent noted. Mild central vascular congestion without overt edema. Low lung volumes. No focal infiltrate to suggest infection. No pneumothorax or large pleural effusion. No acute finding in the visualized skeleton.  IMPRESSION: Central vascular congestion without overt edema. No evidence of infection.  [name], MD 05/13/2019, 8:28 AM PGY-3, [place] Intern pager: [phone], text pages welcome

## 2019-05-13 NOTE — Plan of Care (Signed)
  Problem: Education: Goal: Knowledge of General Education information will improve Description Including pain rating scale, medication(s)/side effects and non-pharmacologic comfort measures Outcome: Progressing   Problem: Clinical Measurements: Goal: Ability to maintain clinical measurements within normal limits will improve Outcome: Progressing   Problem: Clinical Measurements: Goal: Diagnostic test results will improve Outcome: Progressing   Problem: Nutrition: Goal: Adequate nutrition will be maintained Outcome: Progressing   Problem: Elimination: Goal: Will not experience complications related to bowel motility Outcome: Progressing   

## 2019-05-13 NOTE — Progress Notes (Addendum)
While assessing skin this Am, RN saw very small ecchymosis on her right abdominal lower quadrant, and pt was not complaining anything but right now bruises/ecchymosis looks like increasing in size and little stinging. Paged MD to request to assess, waiting for that.  Palma Holter, RN

## 2019-05-13 NOTE — Progress Notes (Signed)
Pt's insulin drip off at 4.30pm, after 2 hrs of Lantus given. Pt got off to bed side commode once, diet started, IV fluid continue @125ml /hr, brace on her right ankle foot, talked with daughter and updated her, will continue to monitor the patient.  Palma Holter, RN

## 2019-05-14 DIAGNOSIS — R519 Headache, unspecified: Secondary | ICD-10-CM

## 2019-05-14 DIAGNOSIS — R110 Nausea: Secondary | ICD-10-CM | POA: Diagnosis not present

## 2019-05-14 DIAGNOSIS — E871 Hypo-osmolality and hyponatremia: Secondary | ICD-10-CM | POA: Diagnosis not present

## 2019-05-14 DIAGNOSIS — G8929 Other chronic pain: Secondary | ICD-10-CM

## 2019-05-14 DIAGNOSIS — E1065 Type 1 diabetes mellitus with hyperglycemia: Secondary | ICD-10-CM

## 2019-05-14 DIAGNOSIS — Z20828 Contact with and (suspected) exposure to other viral communicable diseases: Secondary | ICD-10-CM | POA: Diagnosis not present

## 2019-05-14 DIAGNOSIS — E1010 Type 1 diabetes mellitus with ketoacidosis without coma: Secondary | ICD-10-CM | POA: Diagnosis not present

## 2019-05-14 DIAGNOSIS — E1110 Type 2 diabetes mellitus with ketoacidosis without coma: Secondary | ICD-10-CM | POA: Diagnosis not present

## 2019-05-14 DIAGNOSIS — N179 Acute kidney failure, unspecified: Secondary | ICD-10-CM | POA: Diagnosis not present

## 2019-05-14 LAB — BASIC METABOLIC PANEL
Anion gap: 10 (ref 5–15)
BUN: 10 mg/dL (ref 6–20)
CO2: 16 mmol/L — ABNORMAL LOW (ref 22–32)
Calcium: 8.2 mg/dL — ABNORMAL LOW (ref 8.9–10.3)
Chloride: 106 mmol/L (ref 98–111)
Creatinine, Ser: 0.68 mg/dL (ref 0.44–1.00)
GFR calc Af Amer: >60 mL/min (ref >60)
GFR calc non Af Amer: >60 mL/min (ref >60)
Glucose, Bld: 332 mg/dL — ABNORMAL HIGH (ref 70–99)
Potassium: 3.7 mmol/L (ref 3.5–5.1)
Sodium: 132 mmol/L — ABNORMAL LOW (ref 135–145)

## 2019-05-14 LAB — GLUCOSE, CAPILLARY
Glucose-Capillary: 245 mg/dL — ABNORMAL HIGH (ref 70–99)
Glucose-Capillary: 527 mg/dL (ref 70–99)
Glucose-Capillary: 416 mg/dL — ABNORMAL HIGH (ref 70–99)
Glucose-Capillary: 313 mg/dL — ABNORMAL HIGH (ref 70–99)
Glucose-Capillary: 286 mg/dL — ABNORMAL HIGH (ref 70–99)
Glucose-Capillary: 316 mg/dL — ABNORMAL HIGH (ref 70–99)
Glucose-Capillary: 328 mg/dL — ABNORMAL HIGH (ref 70–99)
Glucose-Capillary: 253 mg/dL — ABNORMAL HIGH (ref 70–99)

## 2019-05-14 LAB — CBC WITH DIFFERENTIAL/PLATELET
Abs Immature Granulocytes: 0.06 10*3/uL (ref 0.00–0.07)
Basophils Absolute: 0 10*3/uL (ref 0.0–0.1)
Basophils Relative: 0 %
Eosinophils Absolute: 0 10*3/uL (ref 0.0–0.5)
Eosinophils Relative: 0 %
HCT: 36.6 % (ref 36.0–46.0)
Hemoglobin: 11.9 g/dL — ABNORMAL LOW (ref 12.0–15.0)
Immature Granulocytes: 0 %
Lymphocytes Relative: 15 %
Lymphs Abs: 2.7 10*3/uL (ref 0.7–4.0)
MCH: 21.1 pg — ABNORMAL LOW (ref 26.0–34.0)
MCHC: 32.5 g/dL (ref 30.0–36.0)
MCV: 64.9 fL — ABNORMAL LOW (ref 80.0–100.0)
Monocytes Absolute: 1.1 10*3/uL — ABNORMAL HIGH (ref 0.1–1.0)
Monocytes Relative: 7 %
Neutro Abs: 13.4 10*3/uL — ABNORMAL HIGH (ref 1.7–7.7)
Neutrophils Relative %: 78 %
Platelets: 236 10*3/uL (ref 150–400)
RBC: 5.64 MIL/uL — ABNORMAL HIGH (ref 3.87–5.11)
RDW: 17 % — ABNORMAL HIGH (ref 11.5–15.5)
WBC: 17.3 10*3/uL — ABNORMAL HIGH (ref 4.0–10.5)
nRBC: 0 % (ref 0.0–0.2)

## 2019-05-14 LAB — PATHOLOGIST SMEAR REVIEW

## 2019-05-14 LAB — HEMOGLOBIN A1C
Hgb A1c MFr Bld: 12.6 % — ABNORMAL HIGH (ref 4.8–5.6)
Mean Plasma Glucose: 315 mg/dL

## 2019-05-14 MED ORDER — PROCHLORPERAZINE EDISYLATE 10 MG/2ML IJ SOLN
5.0000 mg | Freq: Once | INTRAMUSCULAR | Status: AC | PRN
  Administered 2019-05-14: 5 mg via INTRAVENOUS
  Filled 2019-05-14: qty 1

## 2019-05-14 MED ORDER — PROCHLORPERAZINE EDISYLATE 10 MG/2ML IJ SOLN: 5.0000 mg | Freq: Four times a day (QID) | INTRAMUSCULAR | Status: DC | PRN

## 2019-05-14 MED ORDER — INSULIN ASPART 100 UNIT/ML ~~LOC~~ SOLN
0.0000 [IU] | Freq: Three times a day (TID) | SUBCUTANEOUS | Status: DC
  Administered 2019-05-14 (×2): 7 [IU] via SUBCUTANEOUS
  Administered 2019-05-15 – 2019-05-16 (×4): 5 [IU] via SUBCUTANEOUS
  Administered 2019-05-16: 07:00:00 3 [IU] via SUBCUTANEOUS

## 2019-05-14 MED ORDER — TRAMADOL HCL 50 MG PO TABS
25.0000 mg | ORAL_TABLET | Freq: Two times a day (BID) | ORAL | Status: DC | PRN
  Administered 2019-05-14 – 2019-05-16 (×4): 25 mg via ORAL
  Filled 2019-05-14 (×4): qty 1

## 2019-05-14 MED ORDER — PROCHLORPERAZINE EDISYLATE 10 MG/2ML IJ SOLN
5.0000 mg | Freq: Four times a day (QID) | INTRAMUSCULAR | Status: DC | PRN
  Administered 2019-05-15 (×2): 5 mg via INTRAVENOUS
  Filled 2019-05-14 (×3): qty 1

## 2019-05-14 MED ORDER — INSULIN ASPART 100 UNIT/ML ~~LOC~~ SOLN
5.0000 [IU] | Freq: Three times a day (TID) | SUBCUTANEOUS | Status: DC
  Administered 2019-05-14 – 2019-05-16 (×7): 5 [IU] via SUBCUTANEOUS

## 2019-05-14 MED ORDER — INSULIN GLARGINE 100 UNIT/ML ~~LOC~~ SOLN
25.0000 [IU] | Freq: Two times a day (BID) | SUBCUTANEOUS | Status: DC
  Administered 2019-05-14 – 2019-05-15 (×2): 25 [IU] via SUBCUTANEOUS
  Filled 2019-05-14 (×3): qty 0.25

## 2019-05-14 NOTE — Progress Notes (Signed)
Resident made aware of CBG of 416. New order to given 15 units and recheck in hour.

## 2019-05-14 NOTE — Progress Notes (Signed)
Family Medicine Teaching Service Daily Progress Note Intern Pager: [phone]  Patient name: LEANN REYNEN Medical record number: [id number] Date of birth: 10-01-1965 Age: 53 y.o. Gender: female  Primary Care Provider: Shirley, [place], DO Consultants: None Code Status: Full code   Pt Overview and Major Events to Date:  05/12/2019-admitted for nausea and vomiting and fatigue, DKA; insulin drip started 05/13/2019-transitioned to subcutaneous insulin  Assessment and Plan:  FLOREINE LEIER is a 53 y.o. female presenting with nausea, vomiting and fatigue found to be in DKA.  Past medical history significant for uncontrolled type 2 diabetes, CAD, heart failure reduced ejection fraction, hepatic steatosis, bipolar disorder, HLD, hypothyroidism and hypertension.  DKA Patient received 27 units of Lantus daily Patient normally on insulin pump and seen regularly by endocrinologist BG ranges 286-416 in last 24 hours. Patient continues to have nausea.  Most recently with normal potassium of 3.7. Intake 370, output 400, net -30 mL and +2.4 L since admission.Patient noted to previously be on insulin pump prior to admission, will aim to have her back on insulin pump for glucose management.  -consulted DM coordinator, plan to place back on insulin pump  -increase lantus to 50 units daily, 25 bid -will plan to discharge with 25 units BID of subcutaneous insulin until patient can be evaluated by endocrinologist for insulin pump  -will not plan to start patient back on insulin pump at this time  -add 5 units with meals  -change to sensitive sliding scale insulin -compazine given for nausea, one time dose  AKI, resolved Creatinine 0.68 from 0.73 Patient on maintenance IV fluids at 100 mL/h for DKA  Leukocytosis and polycythemia  white blood cell count remains elevated at 17 however improved from initial 22.4.  Patient no longer with polycythemia as hemoglobin decreased to 11.9. Low suspicion that this  leukocytosis is due to infectious etiology.  -AM CBC to make sure this continues to downtrend  CAD with coronary stent placed Patient on home aspirin 81 mg and Brilinta 90 mg twice daily. -Continue home aspirin and Brilinta  Hypothyroid hypothyroidism Patient on home Synthroid 150 mcg -Continue Synthroid 150 mcg  History of bipolar depression Patient on home Prozac 20 mg, lamotrigine 150 mg and Latuda 60 mg daily -Continue home medications -Continue to monitor QTC as it has been prolonged  Hyperlipidemia Continue home atorvastatin 80 mg  QTC prolongation C4 468 on admission, most recent QTC is 467 Patient on cardiac monitoring -Continue daily EKG -continue home Prozac, lamotrigine, and Latuda  FEN/GI: Carb modified diet PPx: Heparin 5000 units  Disposition: Discharge pending medical stabilization  Subjective:  Patient reports that her HA has returned but is improved with Tramadol. Patient also reports that she is experiencing belching and nausea that is improved with compazine.   Objective: Temp:  [98 F (36.7 C)-98.7 F (37.1 C)] 98.6 F (37 C) (10/12 1125) Pulse Rate:  [66-96] 66 (10/12 1125) Resp:  [13-20] 14 (10/12 1125) BP: (100-166)/(56-84) 119/59 (10/12 1125) SpO2:  [95 %-97 %] 95 % (10/12 1125) Weight:  [100.3 kg] 100.3 kg (10/12 0500)   Physical Exam: General: obese female, uncomfortable appearing lying in bed in no acute distress Cardiovascular: Regular rate and rhythm without gallops or murmurs Respiratory: To auscultation without wheezing or crackles Abdomen: Patient continues to have right Extremities: trace edema in lower extremities  Media Information    Document Information  Photos    05/14/2019 07:56  Attached To:  Hospital Encounter on 05/12/19  Source Information  Stark Klein,  MD  [facility]     Laboratory: Recent Labs  Lab 05/12/19 1736 05/12/19 1826 05/13/19 0307 05/14/19 0309  WBC 22.4*  --  23.1* 17.3*   HGB 15.7* 18.7* 14.7 11.9*  HCT 50.5* 55.0* 47.2* 36.6  PLT 449*  --  342 236   Recent Labs  Lab 05/12/19 1736  05/13/19 1130 05/13/19 1540 05/14/19 0309  NA 126*   < > 133* 133* 132*  K 5.1   < > 4.3 4.5 3.7  CL 84*   < > 108 108 106  CO2 8*   < > 16* 16* 16*  BUN 31*   < > 15 14 10   CREATININE 2.15*   < > 0.75 0.73 0.68  CALCIUM 9.1   < > 8.6* 8.5* 8.2*  PROT 8.0  --   --   --   --   BILITOT 2.5*  --   --   --   --   ALKPHOS 121  --   --   --   --   ALT 25  --   --   --   --   AST 17  --   --   --   --   GLUCOSE 870*   < > 155* 178* 332*   < > = values in this interval not displayed.    Imaging/Diagnostic Tests: No new imaging   Stark Klein, MD 05/14/2019, 12:09 PM PGY-1, [place] Intern pager: [phone], text pages welcome

## 2019-05-14 NOTE — Plan of Care (Signed)
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
## 2019-05-14 NOTE — Progress Notes (Signed)
Resident made aware of CBG of unchanged 416.

## 2019-05-14 NOTE — Progress Notes (Signed)
Paged doctor requesting for zofran for dry heaves/nausea.

## 2019-05-15 DIAGNOSIS — E876 Hypokalemia: Secondary | ICD-10-CM | POA: Diagnosis not present

## 2019-05-15 DIAGNOSIS — R110 Nausea: Secondary | ICD-10-CM | POA: Diagnosis not present

## 2019-05-15 DIAGNOSIS — E1110 Type 2 diabetes mellitus with ketoacidosis without coma: Secondary | ICD-10-CM | POA: Diagnosis not present

## 2019-05-15 DIAGNOSIS — E119 Type 2 diabetes mellitus without complications: Secondary | ICD-10-CM | POA: Diagnosis not present

## 2019-05-15 DIAGNOSIS — Z794 Long term (current) use of insulin: Secondary | ICD-10-CM

## 2019-05-15 LAB — BASIC METABOLIC PANEL
Anion gap: 11 (ref 5–15)
BUN: 5 mg/dL — ABNORMAL LOW (ref 6–20)
CO2: 18 mmol/L — ABNORMAL LOW (ref 22–32)
Calcium: 8.1 mg/dL — ABNORMAL LOW (ref 8.9–10.3)
Chloride: 108 mmol/L (ref 98–111)
Creatinine, Ser: 0.59 mg/dL (ref 0.44–1.00)
GFR calc Af Amer: >60 mL/min (ref >60)
GFR calc non Af Amer: >60 mL/min (ref >60)
Glucose, Bld: 290 mg/dL — ABNORMAL HIGH (ref 70–99)
Potassium: 2.7 mmol/L — CL (ref 3.5–5.1)
Sodium: 137 mmol/L (ref 135–145)

## 2019-05-15 LAB — GLUCOSE, CAPILLARY
Glucose-Capillary: 277 mg/dL — ABNORMAL HIGH (ref 70–99)
Glucose-Capillary: 285 mg/dL — ABNORMAL HIGH (ref 70–99)
Glucose-Capillary: 336 mg/dL — ABNORMAL HIGH (ref 70–99)
Glucose-Capillary: 265 mg/dL — ABNORMAL HIGH (ref 70–99)
Glucose-Capillary: 223 mg/dL — ABNORMAL HIGH (ref 70–99)

## 2019-05-15 LAB — CBC
HCT: 35.2 % — ABNORMAL LOW (ref 36.0–46.0)
Hemoglobin: 11.4 g/dL — ABNORMAL LOW (ref 12.0–15.0)
MCH: 20.7 pg — ABNORMAL LOW (ref 26.0–34.0)
MCHC: 32.4 g/dL (ref 30.0–36.0)
MCV: 64 fL — ABNORMAL LOW (ref 80.0–100.0)
Platelets: 201 10*3/uL (ref 150–400)
RBC: 5.5 MIL/uL — ABNORMAL HIGH (ref 3.87–5.11)
RDW: 16.4 % — ABNORMAL HIGH (ref 11.5–15.5)
WBC: 10.3 10*3/uL (ref 4.0–10.5)
nRBC: 0 % (ref 0.0–0.2)

## 2019-05-15 MED ORDER — BACID PO TABS
2.0000 | ORAL_TABLET | Freq: Two times a day (BID) | ORAL | Status: DC
  Administered 2019-05-16 (×2): 2 via ORAL
  Filled 2019-05-15 (×3): qty 2

## 2019-05-15 MED ORDER — INSULIN GLARGINE 100 UNIT/ML ~~LOC~~ SOLN
30.0000 [IU] | Freq: Two times a day (BID) | SUBCUTANEOUS | Status: DC
  Administered 2019-05-15 – 2019-05-16 (×2): 30 [IU] via SUBCUTANEOUS
  Filled 2019-05-15 (×4): qty 0.3

## 2019-05-15 MED ORDER — POTASSIUM CHLORIDE CRYS ER 20 MEQ PO TBCR
40.0000 meq | EXTENDED_RELEASE_TABLET | Freq: Once | ORAL | Status: AC
  Administered 2019-05-15: 40 meq via ORAL
  Filled 2019-05-15: qty 2

## 2019-05-15 MED ORDER — LOPERAMIDE HCL 2 MG PO CAPS
2.0000 mg | ORAL_CAPSULE | Freq: Once | ORAL | Status: AC
  Administered 2019-05-15: 02:00:00 2 mg via ORAL
  Filled 2019-05-15: qty 1

## 2019-05-15 MED ORDER — LOPERAMIDE HCL 2 MG PO CAPS
2.0000 mg | ORAL_CAPSULE | Freq: Once | ORAL | Status: AC | PRN
  Administered 2019-05-16: 2 mg via ORAL
  Filled 2019-05-15: qty 1

## 2019-05-15 NOTE — TOC Benefit Eligibility Note (Signed)
Transition of Care [facility]) Benefit Eligibility Note    Patient Details  Name: Morgan [name] MRN: [id number] Date of Birth: 07-22-1966   Medication/Dose: LANTUS INSULIN PEN  Covered?: Yes  Tier: (TIER- 1 DRUG)  Prescription Coverage Preferred Pharmacy: [facility]  Spoke with Person/Company/Phone Number:: [id number] @  [employer] [place] # [phone]  Co-Pay: Johnsie Kind[employer]  Prior Approval: No  Deductible: Met  Additional Notes: HUMALOG PEN , [place], TIER- 1 DRUG , P/A- NO    Memory Argue Phone Number: 05/15/2019, 11:21 AM

## 2019-05-15 NOTE — TOC Initial Note (Signed)
Transition of Care [facility]) - Initial/Assessment Note    Patient Details  Name: Morgan [name] MRN: [id number] Date of Birth: 03-26-1966  Transition of Care [facility]) CM/SW Contact:    Alberteen Sam, [place] Phone Number: [phone] 05/15/2019, 9:45 AM  Clinical Narrative:                   Benefits check for Lantus insulin pen and a Humalog pen in progress. Will inform patient once received.   Expected Discharge Plan: Home/Self Care Barriers to Discharge: Continued Medical Work up   Patient Goals and CMS Choice        Expected Discharge Plan and Services Expected Discharge Plan: Home/Self Care       Living arrangements for the past 2 months: Single Family Home                                      Prior Living Arrangements/Services Living arrangements for the past 2 months: Single Family Home Lives with:: Self Patient language and need for interpreter reviewed:: Yes        Need for Family Participation in Patient Care: No (Comment) Care giver support system in place?: Yes (comment)   Criminal Activity/Legal Involvement Pertinent to Current Situation/Hospitalization: No - Comment as needed  Activities of Daily Living Home Assistive Devices/Equipment: Insulin Pump, Eyeglasses ADL Screening (condition at time of admission) Patient's cognitive ability adequate to safely complete daily activities?: Yes Is the patient deaf or have difficulty hearing?: No Does the patient have difficulty seeing, even when wearing glasses/contacts?: No Does the patient have difficulty concentrating, remembering, or making decisions?: No Patient able to express need for assistance with ADLs?: Yes Does the patient have difficulty dressing or bathing?: No Independently performs ADLs?: Yes (appropriate for developmental age) Does the patient have difficulty walking or climbing stairs?: No Weakness of Legs: None Weakness of Arms/Hands: None  Permission Sought/Granted                   Emotional Assessment Appearance:: Appears stated age     Orientation: : Oriented to Self, Oriented to Place, Oriented to  Time, Oriented to Situation Alcohol / Substance Use: Not Applicable Psych Involvement: No (comment)  Admission diagnosis:  Diabetic ketoacidosis without coma associated with type 2 diabetes mellitus ([place]) [E11.10] Acute pancreatitis, unspecified complication status, unspecified pancreatitis type [K85.90] Patient Active Problem List   Diagnosis Date Noted  . Type 1 diabetes mellitus with hyperglycemia ([place])   . Chronic nonintractable headache   . DKA (diabetic ketoacidoses) ([place]) 05/12/2019  . Intertrigo 03/05/2019  . Type 2 diabetes mellitus with hyperglycemia, with long-term current use of insulin ([place]) 11/17/2018  . S/P coronary artery stent placement   . Acute systolic heart failure ([place])   . Hyperlipidemia LDL goal <70   . Intractable nausea and vomiting 10/26/2018  . Nausea 08/09/2018  . Esophagitis determined by endoscopy   . Dysphagia 07/24/2018  . Odynophagia 07/23/2018  . Orthostatic hypotension 07/22/2018  . Tachycardia 07/22/2018  . Hepatic steatosis   . Elevated transaminase level 07/21/2018  . Mild renal insufficiency 07/21/2018  . Leukocytosis 07/21/2018  . Bipolar depression ([place]) 07/21/2018  . Incidental lung nodule 07/21/2018  . Extravasation accident, initial encounter 07/21/2018  . Prolonged QT interval 07/21/2018  . LFTs abnormal   . Hyperlipidemia 05/14/2016  . Hypothyroidism 05/14/2016  . Essential hypertension 05/14/2016   PCP:  Shirley, [place], DO  Pharmacy:   [facility], [place] [address] Phone: [phone] Fax: [phone]  [place], [place] [facility] [address] [place] [address] [place] [address] Phone: [phone] Fax: [place], [place] - [address] [place] [address] Phone: [phone] Fax: [phone]     Social Determinants of Health (SDOH) Interventions    Readmission Risk Interventions Readmission Risk Prevention Plan 10/29/2018  Transportation Screening Complete  PCP or Specialist Appt within 3-5 Days Complete  HRI or [place] Complete  Social Work Consult for [place] Planning/Counseling Complete  Palliative Care Screening Not Applicable  Medication Review Press photographer) Complete  Some recent data might be hidden

## 2019-05-15 NOTE — Progress Notes (Addendum)
Family Medicine Teaching Service Daily Progress Note Intern Pager: [phone]  Patient name: Morgan [name] Medical record number: [id number] Date of birth: September 15, 1965 Age: 53 y.o. Gender: female  Primary Care Provider: Shirley, [place], DO Consultants: Diabetes coordinator Code Status: Full code  Pt Overview and Major Events to Date:  05/12/2019-admitted for nausea and vomiting and fatigue, DKA; insulin drip started 05/13/2019-transitioned to subcutaneous insulin  Assessment and Plan:  Morgan [name] is a 53 y.o. female presenting with nausea, vomiting and fatigue found to be in DKA.  Past medical history significant for uncontrolled type 2 diabetes, CAD, HF reduced ejection fraction, hepatic steatosis, bipolar disorder, HLD, hypothyroidism and hypertension.  DKA Patient received  52 units of basal insulin overnight, with per meal blood glucose ranges overnight 253-328 Patient received 24 units of sliding scale insulin overnight and meal coverage. Patient normally on insulin pump and followed by endocrinologist Patient reports continued intermittent nausea for which she is received Compazine and alcohol swab. Hemoglobin A1c 12.6. -consulted DM coordinator, suggested to increase to 30 units twice daily, will continue 5 units with meals as patient is decreasing her PO intake as she felt discomfort after eating her breakfast  -continue sliding scale insulin, sensitive -will plan to keep patient overnight and have son bring her insulin pump to the hospital on Wed 10/14 for monitoring of her BG with the pump and titrate insulin regimen so she can be discharged with her insulin pump  AKI, resolved Creatinine 0.59 from 0.68 -discontinue mIV fluids at 100 mL/h for DKA  Leukocytosis and polycythemia  white blood cell count has decreased to normal range of 10.3 from initial 22.4.  Patient no longer with polycythemia as hemoglobin decreased to 11.4. Low suspicion that this leukocytosis is  due to infectious etiology.  -AM CBC to make sure this continues to downtrend  CAD with coronary stent placed Patient on home aspirin 81 mg and Brilinta 90 mg twice daily. -Continue home aspirin and Brilinta  Hypothyroid hypothyroidism Patient on home Synthroid 150 mcg -Continue Synthroid 150 mcg  History of bipolar depression Patient on home Prozac 20 mg, lamotrigine 150 mg and Latuda 60 mg daily -Continue home medications -Continue to monitor QTC as it has been prolonged  Hyperlipidemia Continue home atorvastatin 80 mg  QTC prolongation C4 468 on admission, most recent QTC is 467 Patient on cardiac monitoring -Continue daily EKG -continue home Prozac, lamotrigine, and Latuda  FEN/GI: Carb modified diet PPx: Heparin 5000 units  Subjective:  Patient states that she feels some discomfort from eating her breakfast.  Patient states that she feels like she ate "too much".  Patient also reports that she would like to be back on her insulin pump is when she feels acutely sick, she knows she will release to receive her insulin when she has the pump versus having to administer insulin to herself.  Objective: Temp:  [97.5 F (36.4 C)-98.6 F (37 C)] 98.4 F (36.9 C) (10/13 1101) Pulse Rate:  [81-105] 97 (10/13 1101) Resp:  [15-18] 18 (10/13 1101) BP: (143-182)/(57-93) 182/83 (10/13 1101) SpO2:  [97 %-99 %] 97 % (10/13 1101) Weight:  [100.5 kg] 100.5 kg (10/13 0400)  Physical Exam: General: Obese female appearing stated age sitting up in bed, in no acute distress Cardiovascular: Regular rate and rhythm without murmurs or gallop Respiratory: Clear to auscultation bilaterally without wheezing or crackles, patient stable Abdomen: Obese abdomen, soft, some soreness in right lower quadrant secondary to hematoma, expanding hematoma on right abdomen Extremities: Trace lower  extremity edema  Media Information    Document Information  Photos    05/15/2019 14:49  Attached  To:  Hospital Encounter on 05/12/19  Source Information  Stark Klein, MD  [facility]     Laboratory: Recent Labs  Lab 05/13/19 0307 05/14/19 0309 05/15/19 0543  WBC 23.1* 17.3* 10.3  HGB 14.7 11.9* 11.4*  HCT 47.2* 36.6 35.2*  PLT 342 236 201   Recent Labs  Lab 05/12/19 1736  05/13/19 1540 05/14/19 0309 05/15/19 0543  NA 126*   < > 133* 132* 137  K 5.1   < > 4.5 3.7 2.7*  CL 84*   < > 108 106 108  CO2 8*   < > 16* 16* 18*  BUN 31*   < > 14 10 5*  CREATININE 2.15*   < > 0.73 0.68 0.59  CALCIUM 9.1   < > 8.5* 8.2* 8.1*  PROT 8.0  --   --   --   --   BILITOT 2.5*  --   --   --   --   ALKPHOS 121  --   --   --   --   ALT 25  --   --   --   --   AST 17  --   --   --   --   GLUCOSE 870*   < > 178* 332* 290*   < > = values in this interval not displayed.    Imaging/Diagnostic Tests: No new imaging   Stark Klein, MD 05/15/2019, 2:04 PM PGY-1, [place] Intern pager: [phone], text pages welcome

## 2019-05-15 NOTE — Progress Notes (Signed)
Inpatient Diabetes Program Recommendations  AACE/ADA: New Consensus Statement on Inpatient Glycemic Control (2015)  Target Ranges:  Prepandial:   less than 140 mg/dL      Peak postprandial:   less than 180 mg/dL (1-2 hours)      Critically ill patients:  140 - 180 mg/dL   Lab Results  Component Value Date   GLUCAP 277 (H) 05/15/2019   HGBA1C 12.6 (H) 05/12/2019    Review of Glycemic Control Results for DYAMON, KACZMAREK (MRN [id number]) as of 05/15/2019 10:29  Ref. Range 05/14/2019 06:50 05/14/2019 11:23 05/14/2019 16:49 05/14/2019 21:09 05/15/2019 06:04  Glucose-Capillary Latest Ref Range: 70 - 99 mg/dL 286 (H) 316 (H) 328 (H) 253 (H) 277 (H)   Diabetes history: DM 2 Outpatient Diabetes medications:  Type 2 DM per Endocrinology note from 01/30/19.  Pump  METRONIC    Insulin type Humalog   Basal rate      0000-0730 2.4u/h    0730-0900  2.4u/h   0900-0000 2.4u/h    Total basal- 57.6   I:C ratio      0000 4.0                  Sensitivity      0000 20      Goal     0800-0000 120   0000-0800 130     Current orders for Inpatient glycemic control:  Lantus 25 units bid, Novolog sensitive tid with meals Novolog 5 units tid with meals  Inpatient Diabetes Program Recommendations:    Please consider increasing Lantus to 30 units bid.  Also consider increasing Novolog meal coverage to 10 units tid with meals (hold if patient eats less than 50%).   Thanks  Adah Perl, RN, BC-ADM Inpatient Diabetes Coordinator Pager [phone] (8a-5p)

## 2019-05-15 NOTE — Progress Notes (Signed)
Received a page from patient's nurse that patient is having diarrhea and requesting Imodium.  Clarified with pharmacy as patient has prolonged QTC.  Pharmacy advised 1-2 doses of Imodium should be fine in this patient with her current QTC.

## 2019-05-15 NOTE — Progress Notes (Signed)
Paged Dr. Vanessa Camano  About pt K+ 2.7.

## 2019-05-16 ENCOUNTER — Ambulatory Visit: Payer: 59 | Admitting: Internal Medicine

## 2019-05-16 ENCOUNTER — Other Ambulatory Visit: Payer: Self-pay

## 2019-05-16 DIAGNOSIS — R110 Nausea: Secondary | ICD-10-CM | POA: Diagnosis not present

## 2019-05-16 DIAGNOSIS — E119 Type 2 diabetes mellitus without complications: Secondary | ICD-10-CM | POA: Diagnosis not present

## 2019-05-16 DIAGNOSIS — R519 Headache, unspecified: Secondary | ICD-10-CM | POA: Diagnosis not present

## 2019-05-16 DIAGNOSIS — E876 Hypokalemia: Secondary | ICD-10-CM | POA: Diagnosis not present

## 2019-05-16 LAB — GLUCOSE, CAPILLARY
Glucose-Capillary: 240 mg/dL — ABNORMAL HIGH (ref 70–99)
Glucose-Capillary: 270 mg/dL — ABNORMAL HIGH (ref 70–99)

## 2019-05-16 LAB — BASIC METABOLIC PANEL
Anion gap: 13 (ref 5–15)
Anion gap: 13 (ref 5–15)
BUN: 5 mg/dL — ABNORMAL LOW (ref 6–20)
BUN: 9 mg/dL (ref 6–20)
CO2: 20 mmol/L — ABNORMAL LOW (ref 22–32)
CO2: 19 mmol/L — ABNORMAL LOW (ref 22–32)
Calcium: 8.9 mg/dL (ref 8.9–10.3)
Calcium: 9.1 mg/dL (ref 8.9–10.3)
Chloride: 103 mmol/L (ref 98–111)
Chloride: 103 mmol/L (ref 98–111)
Creatinine, Ser: 0.52 mg/dL (ref 0.44–1.00)
Creatinine, Ser: 0.79 mg/dL (ref 0.44–1.00)
GFR calc Af Amer: >60 mL/min (ref >60)
GFR calc Af Amer: >60 mL/min (ref >60)
GFR calc non Af Amer: >60 mL/min (ref >60)
GFR calc non Af Amer: >60 mL/min (ref >60)
Glucose, Bld: 249 mg/dL — ABNORMAL HIGH (ref 70–99)
Glucose, Bld: 310 mg/dL — ABNORMAL HIGH (ref 70–99)
Potassium: 2.7 mmol/L — CL (ref 3.5–5.1)
Potassium: 3.3 mmol/L — ABNORMAL LOW (ref 3.5–5.1)
Sodium: 136 mmol/L (ref 135–145)
Sodium: 135 mmol/L (ref 135–145)

## 2019-05-16 LAB — MAGNESIUM: Magnesium: 1.6 mg/dL — ABNORMAL LOW (ref 1.7–2.4)

## 2019-05-16 MED ORDER — INFLUENZA VAC SPLIT QUAD 0.5 ML IM SUSY
0.5000 mL | PREFILLED_SYRINGE | Freq: Once | INTRAMUSCULAR | Status: AC
  Administered 2019-05-16: 16:00:00 0.5 mL via INTRAMUSCULAR
  Filled 2019-05-16: qty 0.5

## 2019-05-16 MED ORDER — POTASSIUM CHLORIDE CRYS ER 20 MEQ PO TBCR
40.0000 meq | EXTENDED_RELEASE_TABLET | Freq: Once | ORAL | Status: AC
  Administered 2019-05-16: 40 meq via ORAL
  Filled 2019-05-16: qty 2

## 2019-05-16 MED ORDER — INSULIN GLARGINE 100 UNIT/ML ~~LOC~~ SOLN: 30.0000 [IU] | Freq: Two times a day (BID) | SUBCUTANEOUS | 0 refills | Status: DC

## 2019-05-16 MED ORDER — INFLUENZA VAC SPLIT QUAD 0.5 ML IM SUSY: 0.5000 mL | PREFILLED_SYRINGE | INTRAMUSCULAR | Status: DC

## 2019-05-16 MED ORDER — POTASSIUM CHLORIDE CRYS ER 20 MEQ PO TBCR: 40.0000 meq | EXTENDED_RELEASE_TABLET | Freq: Two times a day (BID) | ORAL | Status: DC

## 2019-05-16 MED ORDER — POTASSIUM CHLORIDE CRYS ER 20 MEQ PO TBCR
40.0000 meq | EXTENDED_RELEASE_TABLET | Freq: Two times a day (BID) | ORAL | Status: DC
  Administered 2019-05-16: 40 meq via ORAL
  Filled 2019-05-16: qty 2

## 2019-05-16 MED ORDER — INSULIN ASPART 100 UNIT/ML ~~LOC~~ SOLN: 5.0000 [IU] | Freq: Three times a day (TID) | SUBCUTANEOUS | 0 refills | Status: DC

## 2019-05-16 MED ORDER — FLUOXETINE HCL 20 MG PO TABS: 20.0000 mg | ORAL_TABLET | Freq: Every day | ORAL | 0 refills | Status: DC

## 2019-05-16 MED FILL — LANTUS 100 UNITS/ML VIAL: 100 | 16 days supply | Qty: 10 | Fill #0

## 2019-05-16 MED FILL — ULTICARE INS 0.5 ML 30GX1/2: 30G X 1/2" | 30 days supply | Qty: 60 | Fill #0

## 2019-05-16 NOTE — Progress Notes (Addendum)
CRITICAL VALUE ALERT  Critical Value: Potassium  2.7  Date & Time Notied:  05/16/2019@0809   Provider Notified: Dr Scarlette Calico  Orders Received/Actions taken: to be ordered replacement

## 2019-05-16 NOTE — Progress Notes (Signed)
Family Medicine Teaching Service Daily Progress Note Intern Pager: [phone]  Patient name: Morgan [name] Medical record number: [id number] Date of birth: 08-22-1965 Age: 53 y.o. Gender: female  Primary Care Provider: Shirley, [place], DO Consultants:  Code Status: Full code  Pt Overview and Major Events to Date: 05/12/2019-admitted for nausea and vomiting and fatigue, DKA;insulin drip started 05/13/2019-transitioned to subcutaneous insulin  Assessment and Plan: Morgan [name] y.o.female presenting with nausea, vomiting and fatigue found to be in DKA. Past medical history significant for uncontrolled type 2 diabetes, CAD, HF reduced ejection fraction, hepatic steatosis, bipolar disorder, HLD, hypothyroidism and hypertension.  DKA, resolved now with hyperglycemia that is improving Patient received total of 55 units of long-acting insulin yesterday, blood glucose ranges overnight 223-336, most recently 240 Patient received 28 units of fast acting insulin overnight  including meal coverage, at 5 units per meal.  Patient is noted to only consume 20% of her meals. Patient normally on insulin pump and followed by endocrinologist Hemoglobin A1c 12.6. -DM coordinator following, will appreciate recommendations -continue sliding scale insulin, sensitive -will discuss discharging patient on subcutaneous insulin with plan to restart insulin pump with endocrinologist at outpatient visit ASAP -will call to see if we can reschedule patient with endocrinology    AKI,resolved Creatinine 0.52   Leukocytosis and polycythemia, resolved Last white blood cell count decreased to normal range of 10.3 from initial 22.4. Patient no longer with polycythemia as hemoglobin decreased to 11.4. Low suspicion that this leukocytosis is due to infectious etiology.  CAD with coronary stent placed Patient on home aspirin 81 mg and Brilinta 90 mg twice daily. -Continue home aspirin and  Brilinta  Hypothyroid hypothyroidism Patient on home Synthroid 150 mcg -Continue Synthroid 150 mcg  History of bipolar depression Patient on home Prozac 20 mg, lamotrigine 150 mg and Latuda 60 mg daily -Continue home medications -Continue to monitor QTC as it has been prolonged  Hyperlipidemia Continue home atorvastatin 80 mg  QTC prolongation C4468 on admission, most recent QTC is467 Patient on cardiac monitoring -Continue daily EKG -continue home Prozac, lamotrigine, and Latuda  FEN/GI:Carb modified diet [id number] 5000 units  Disposition: Plan to discharge home 10/15 with insulin pump  Subjective:  Patient reports that she feels well this morning.   Patient noted to have diarrhea overnight that has now resolved.   Objective: Temp:  [97.5 F (36.4 C)-98.6 F (37 C)] 98.3 F (36.8 C) (10/14 0328) Pulse Rate:  [71-105] 71 (10/13 2306) Resp:  [13-18] 13 (10/13 1645) BP: (101-182)/(54-84) 101/54 (10/14 0328) SpO2:  [68 %-99 %] 68 % (10/13 2306) Weight:  [99.6 kg] 99.6 kg (10/14 0328)  Physical Exam: General: female, in NAD sitting up in bed  Cardiovascular: RRR without murmurs or gallops  Respiratory: CTAB without wheezing or crackles, stable on RA  Abdomen: still with right abdominal hematoma, tender in the area of the hematoma, bowel sounds present throughout, no crepitus in area of hematoma  Extremities: trace LE edema  Laboratory: Recent Labs  Lab 05/13/19 0307 05/14/19 0309 05/15/19 0543  WBC 23.1* 17.3* 10.3  HGB 14.7 11.9* 11.4*  HCT 47.2* 36.6 35.2*  PLT 342 236 201   Recent Labs  Lab 05/12/19 1736  05/13/19 1540 05/14/19 0309 05/15/19 0543  NA 126*   < > 133* 132* 137  K 5.1   < > 4.5 3.7 2.7*  CL 84*   < > 108 106 108  CO2 8*   < > 16* 16* 18*  BUN 31*   < >  14 10 5*  CREATININE 2.15*   < > 0.73 0.68 0.59  CALCIUM 9.1   < > 8.5* 8.2* 8.1*  PROT 8.0  --   --   --   --   BILITOT 2.5*  --   --   --   --   ALKPHOS 121  --   --    --   --   ALT 25  --   --   --   --   AST 17  --   --   --   --   GLUCOSE 870*   < > 178* 332* 290*   < > = values in this interval not displayed.     Imaging/Diagnostic Tests: No new imaging  Stark Klein, MD 05/16/2019, 5:01 AM PGY-1, [place] Intern pager: [phone], text pages welcome

## 2019-05-16 NOTE — Progress Notes (Signed)
Late entry 1030- hrs Pt having occasional PVcs Dr Scarlette Calico informed , Pt had oral replacement of .potassium earlier .   Pt concerned about  Being discharged with lantus instead of her insulin pump. She also stated her Dr will be on vacation in until November , and was told her Dr would need to restart her on the pump.

## 2019-05-16 NOTE — Discharge Instructions (Signed)
You were admitted for high blood sugars due to not being on your insulin pump for a few days. Your blood sugars became well controlled on subcutaneous insulin. You will continue on insulin injections until you are able to see your endocrinologist tomorrow morning at 9:50 AM.  You should also follow-up with your primary doctor on Friday as scheduled.  If you develop nausea, vomiting with blood sugars greater than than 600, you should present to the emergency department.  Call your endocrinologist for any blood sugars over 300 or lower than 80.

## 2019-05-16 NOTE — TOC Progression Note (Signed)
Transition of Care [facility]) - Progression Note    Patient Details  Name: MIEASHA BARANEK MRN: [id number] Date of Birth: [birth year]  [facility]) CM/SW [place], [place] Phone Number: [phone] 05/16/2019, 3:59 PM  Clinical Narrative:     CSW provided RN with Taxi Voucher for patient to be able to be discharged home. Nurse will call [place] at [phone] when patient is ready to dc.   No further discharge needs identified at this time.   Expected Discharge Plan: Home/Self Care Barriers to Discharge: Continued Medical Work up  Expected Discharge Plan and Services Expected Discharge Plan: Home/Self Care       Living arrangements for the past 2 months: Single Family Home Expected Discharge Date: 05/16/19                                     Social Determinants of Health (SDOH) Interventions    Readmission Risk Interventions Readmission Risk Prevention Plan 10/29/2018  Transportation Screening Complete  PCP or Specialist Appt within 3-5 Days Complete  HRI or [place] Complete  Social Work Consult for [place] Planning/Counseling Complete  Palliative Care Screening Not Applicable  Medication Review Press photographer) Complete  Some recent data might be hidden

## 2019-05-16 NOTE — Progress Notes (Signed)
Diabetes history: DM 2 Outpatient Diabetes medications:  Type 2 DM per Endocrinology note from 01/30/19. Pump  METRONIC    Insulin type Humalog   Basal rate      0000-0730 2.4u/h    0730-0900  2.4u/h   0900-0000 2.4u/h    Total basal- 57.6   I:C ratio      0000 4.0                  Sensitivity      0000 20      Goal     0800-0000 120   0000-0800 130     Current orders for Inpatient glycemic control:  Lantus 30 units bid, Novolog sensitive tid with meals Novolog 5 units tid with meals   Recommendations:  Spoke briefly with MD.  Patient has received Lantus this AM. Insulin pump is not here.  May consider d/c home on Lantus/Novolog and have patient f/u with endocrinology for restart of insulin pump.  (Lantus is 0$ co-pay).   Thanks  Adah Perl, RN, BC-ADM Inpatient Diabetes Coordinator Pager [phone] (8a-5p)

## 2019-05-16 NOTE — Progress Notes (Signed)
Pt taken to taxi im wheelchair accompanied by staff.

## 2019-05-16 NOTE — TOC Progression Note (Signed)
Transition of Care [facility]) - Progression Note    Patient Details  Name: TYLLER GRUHLKE MRN: [id number] Date of Birth: 05-22-1966  Transition of Care [facility]) CM/SW [place], [place] Phone Number: [phone] 05/16/2019, 11:22 AM  Clinical Narrative:     CSW informed patient of Lantus insulin pen $0 copay, patient reports understanding. CSW inquired of any needs at discharge, patient reports if she can not find family/friends to transport home at time of discharge she will most likely get an uber/lyft. CSW informed patient if she needed taxi voucher these are available, as patient reports being unsure if she will have enough money for uber/lyft.   Please inform CSW if taxi voucher is needed at time of discharge thank you.   Expected Discharge Plan: Home/Self Care Barriers to Discharge: Continued Medical Work up  Expected Discharge Plan and Services Expected Discharge Plan: Home/Self Care       Living arrangements for the past 2 months: Single Family Home                                       Social Determinants of Health (SDOH) Interventions    Readmission Risk Interventions Readmission Risk Prevention Plan 10/29/2018  Transportation Screening Complete  PCP or Specialist Appt within 3-5 Days Complete  HRI or [place] Complete  Social Work Consult for [place] Planning/Counseling Complete  Palliative Care Screening Not Applicable  Medication Review Press photographer) Complete  Some recent data might be hidden

## 2019-05-18 ENCOUNTER — Ambulatory Visit (INDEPENDENT_AMBULATORY_CARE_PROVIDER_SITE_OTHER): Payer: 59 | Admitting: Internal Medicine

## 2019-05-18 ENCOUNTER — Encounter: Payer: Self-pay | Admitting: Internal Medicine

## 2019-05-18 ENCOUNTER — Ambulatory Visit (INDEPENDENT_AMBULATORY_CARE_PROVIDER_SITE_OTHER): Payer: 59 | Admitting: Family Medicine

## 2019-05-18 ENCOUNTER — Other Ambulatory Visit: Payer: Self-pay

## 2019-05-18 VITALS — BP 130/72 | HR 83 | Ht 63.0 in | Wt 225.6 lb

## 2019-05-18 VITALS — BP 100/56 | HR 82 | Wt 226.6 lb

## 2019-05-18 DIAGNOSIS — Z955 Presence of coronary angioplasty implant and graft: Secondary | ICD-10-CM

## 2019-05-18 DIAGNOSIS — E349 Endocrine disorder, unspecified: Secondary | ICD-10-CM | POA: Diagnosis not present

## 2019-05-18 DIAGNOSIS — Z794 Long term (current) use of insulin: Secondary | ICD-10-CM

## 2019-05-18 DIAGNOSIS — E876 Hypokalemia: Secondary | ICD-10-CM | POA: Diagnosis not present

## 2019-05-18 DIAGNOSIS — E1165 Type 2 diabetes mellitus with hyperglycemia: Secondary | ICD-10-CM

## 2019-05-18 DIAGNOSIS — E559 Vitamin D deficiency, unspecified: Secondary | ICD-10-CM

## 2019-05-18 LAB — GLUCOSE, POCT (MANUAL RESULT ENTRY)
POC Glucose: 114 mg/dl — AB (ref 70–99)
POC Glucose: 66 mg/dl — AB (ref 70–99)
POC Glucose: 96 mg/dl (ref 70–99)

## 2019-05-18 MED ORDER — INSULIN PEN NEEDLE 31G X 5 MM MISC: 1.0000 | 11 refills | Status: DC

## 2019-05-18 MED ORDER — NITROGLYCERIN 0.4 MG SL SUBL: 0.4000 mg | SUBLINGUAL_TABLET | SUBLINGUAL | 12 refills | Status: DC | PRN

## 2019-05-18 MED ORDER — LANTUS SOLOSTAR 100 UNIT/ML ~~LOC~~ SOPN: 48.0000 [IU] | PEN_INJECTOR | Freq: Every day | SUBCUTANEOUS | 6 refills | Status: DC

## 2019-05-18 MED ORDER — INSULIN LISPRO (1 UNIT DIAL) 100 UNIT/ML (KWIKPEN): 5.0000 [IU] | PEN_INJECTOR | Freq: Three times a day (TID) | SUBCUTANEOUS | 6 refills | Status: DC

## 2019-05-18 NOTE — Patient Instructions (Signed)
Thank you for coming to see me today. It was a pleasure. Today we talked about:   Your diabetes: keep with same plan and continue to attempt to get in touch with your endocrinologist to ensure your pump can be restarted and how to manage your insulin.  Your potassium: We will recheck today and your vitamin D level at your request.  We will also recheck the beta-hCG that was a little elevated in the hospital.  I sent the refill for your nitro.  Please follow-up with your PCP in 1 month or sooner as needed to ensure your diabetes is well-managed.  If you have any questions or concerns, please do not hesitate to call the office at [phone].  Best,   [place] Constable, DO

## 2019-05-18 NOTE — Progress Notes (Signed)
PATIENT IDENTIFIER: [name] is a 53 y.o. female with a past medical history of IDDM, HTN, Bipolar d/o,hypothyroidism, Hx of CVA, and  CAD (S/P DES 10/2018) . The patient has followed with Endocrinology clinic since 09/25/2018 for consultative assistance with management of her diabetes.  DIABETIC HISTORY:  Morgan [name] was diagnosed with T2DM many years ago. She is intolerant to Metformin due to diarrhea. She has been on SU and actos in the past without reported intolerance. She has been on an insulin pump since 2017. Her hemoglobin A1c has ranged from 9.8% in 06/2018, peaking at 13.2% in 2017.   Works 11 AM to 10 pm for [employer]   On her initial presentation to our clinic her A1c was 10.4 %. She was on medtronic pump with a daytime hourly rate of 8 u/hr from 9 am to MN. She was also on Ozempic 0.5 mg weekly.   She is intolerant to Ozempicdue to GI side effects   In 05/2019 she presented to the ED with nausea and vomiting, found to have a BG 870 mg/dL with severe acidosis and was switched from the pump to MDI regimen.   SUBJECTIVE:   During the last visit (01/30/2019): Pt was maintained on the pump.   Today (05/18/2019): Morgan [name] is here for a follow up on diabetes management. She had a recent admission for severe DKA. She had stopped using the minimed pump for ~ 2 weeks prior to presenting to the hospital. Pt assures me she was not trying to commit suicide. She has a BG of > 800 mg/dL. Pt was discussed on an MDI regimen.     Broke to the left ankle, s/p fall when he knee gave out. Has a left boot for 2 weeks, cast on for 6 weeks prior to that.   ROS: As per HPI and as detailed below: Review of Systems  Constitutional: Negative for fever and malaise/fatigue.  HENT: Negative for congestion and sore throat.   Respiratory: Negative for cough and shortness of breath.   Cardiovascular: Negative for chest pain and palpitations.  Gastrointestinal: Negative for nausea and  vomiting.      HOME DIABETES REGIMEN:  Lantus 30 units BID  Humalog 5 units with each meal       HISTORY:  Past Medical History:  Past Medical History:  Diagnosis Date  . Anxiety   . Asthma   . Bipolar depression ([place])   . Chronic bronchitis ([place])   . Chronic esophagogastric ulcer   . Chronic stomach ulcer   . Fibroid   . GERD (gastroesophageal reflux disease)   . Heart murmur   . Hepatic steatosis   . History of blood transfusion 2010   "related to subclavian stent"  . History of hiatal hernia   . Hyperlipidemia   . Hypertension   . Hypothyroid   . Migraine    "cerebral migraines; 1-2/month" (06/29/2018)  . Non-ST elevation (NSTEMI) myocardial infarction ([place]) 10/27/2018  . 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 Stroke [facility])    "I've had 2; most recent one was ~ 2015 or before; affected balance" (06/29/2018)  . Thalassemia minor   . Type II diabetes mellitus ([place])   . Vitamin D deficiency    Past Surgical History:  Past Surgical History:  Procedure Laterality Date  . ABDOMINAL HYSTERECTOMY  2015   "still have 1 ovary"  . BIOPSY  07/25/2018   Procedure: BIOPSY;  Surgeon: Thornton Park, MD;  Location: [facility]  ENDOSCOPY;  Service: Gastroenterology;;  . [place]; 1996  . CORONARY STENT INTERVENTION N/A 10/27/2018   Procedure: CORONARY STENT INTERVENTION;  Surgeon: Sherren Mocha, MD;  Location: [place] CV LAB;  Service: Cardiovascular;  Laterality: N/A;  . ESOPHAGOGASTRODUODENOSCOPY (EGD) WITH PROPOFOL N/A 07/25/2018   Procedure: ESOPHAGOGASTRODUODENOSCOPY (EGD) WITH PROPOFOL;  Surgeon: Thornton Park, MD;  Location: [place];  Service: Gastroenterology;  Laterality: N/A;  . KNEE RECONSTRUCTION Left 1980  . LAPAROSCOPIC CHOLECYSTECTOMY    . LEFT HEART CATH AND CORONARY ANGIOGRAPHY N/A 10/27/2018   Procedure: LEFT HEART CATH AND CORONARY ANGIOGRAPHY;  Surgeon: Sherren Mocha, MD;  Location: [place] CV LAB;  Service: Cardiovascular;   Laterality: N/A;  . SUBCLAVIAN STENT PLACEMENT Left 2010  . TONSILLECTOMY  1970    Social History:  reports that she quit smoking about 5 years ago. Her smoking use included cigarettes. She has a 72.00 pack-year smoking history. She has never used smokeless tobacco. She reports previous alcohol use. She reports previous drug use. Family History:  Family History  Problem Relation Age of Onset  . Diabetes Mother   . Hypertension Mother   . Hyperlipidemia Mother   . CAD Mother   . Cervical cancer Mother   . CAD Father   . Heart failure Father   . Testicular cancer Father   . Hypertension Sister   . Hyperlipidemia Sister   . Hypertension Brother   . Hyperlipidemia Brother      HOME MEDICATIONS: Allergies as of 05/18/2019      Reactions   Ilosone [erythromycin] Anaphylaxis   Keflex [cephalexin] Anaphylaxis   Penicillins Anaphylaxis   Has patient had a PCN reaction causing immediate rash, facial/tongue/throat swelling, SOB or lightheadedness with hypotension: YES Has patient had a PCN reaction causing severe rash involving mucus membranes or skin necrosis: NO Has patient had a PCN reaction that required hospitalizationNO Has patient had a PCN reaction occurring within the last 10 years: NO If all of the above answers are "NO", then may proceed with Cephalosporin use.   Iron Other (See Comments)   Pt reports condition limiting Iron intake.   Metformin And Related Other (See Comments)   Pt prefers to not take this medication   Morphine And Related Other (See Comments)   Pt reports hallucinations      Medication List       Accurate as of May 18, 2019  3:12 PM. If you have any questions, ask your nurse or doctor.        STOP taking these medications   insulin aspart 100 UNIT/ML injection Commonly known as: novoLOG Stopped by: Dorita Sciara, MD     TAKE these medications   Accu-Chek FastClix Lancets Misc 1 Package by Does not apply route 3 (three) times  daily.   Advair HFA 115-21 MCG/ACT inhaler Generic drug: fluticasone-salmeterol Inhale 1 puff into the lungs 2 (two) times daily.   aspirin 81 MG tablet Take 1 tablet (81 mg total) by mouth daily.   atorvastatin 80 MG tablet Commonly known as: LIPITOR Take 1 tablet (80 mg total) by mouth daily.   butalbital-acetaminophen-caffeine 50-325-40 MG tablet Commonly known as: FIORICET Take 1-2 tablets by mouth every 6 (six) hours as needed for headache or migraine.   Contour Next One Kit 1 kit by Does not apply route as directed.   Contour Next Test test strip Generic drug: glucose blood Use as instructed   EPINEPHrine 0.3 mg/0.3 mL Soaj injection Commonly known as: EPI-PEN Inject  0.3 mg into the muscle once as needed (for an anaphylactic reaction).   FLUoxetine 20 MG tablet Commonly known as: PROZAC Take 1 tablet (20 mg total) by mouth daily.   furosemide 20 MG tablet Commonly known as: LASIX Take 1 tablet (20 mg total) by mouth 2 (two) times daily.   Glucagon Emergency 1 MG injection Generic drug: glucagon Inject 1 mg into the vein once as needed (FOR ONSET OF HYPOGLYCEMIA).   HumaLOG KwikPen 100 UNIT/ML KwikPen Generic drug: insulin lispro Inject 5 Units into the skin 3 (three) times daily. Inject 5 units under the skin 3 times daily before meals.   HYDROcodone-acetaminophen 5-325 MG tablet Commonly known as: Norco Take 1 tablet by mouth every 4 (four) hours as needed for severe pain.   insulin glargine 100 UNIT/ML injection Commonly known as: LANTUS Inject 0.3 mLs (30 Units total) into the skin 2 (two) times daily.   lamoTRIgine 150 MG tablet Commonly known as: LAMICTAL Take 150 mg by mouth daily.   Latuda 60 MG Tabs Generic drug: Lurasidone HCl Take 60 mg by mouth daily.   levothyroxine 150 MCG tablet Commonly known as: SYNTHROID Take 150 mcg by mouth daily.   losartan 100 MG tablet Commonly known as: COZAAR Take 1 tablet (100 mg total) by mouth daily.    metoprolol tartrate 50 MG tablet Commonly known as: LOPRESSOR Take 1 tablet (50 mg total) by mouth 2 (two) times daily.   multivitamin with minerals Tabs tablet Take 1 tablet by mouth daily.   nitroGLYCERIN 0.4 MG SL tablet Commonly known as: NITROSTAT Place 1 tablet (0.4 mg total) under the tongue every 5 (five) minutes x 3 doses as needed for chest pain.   nystatin powder Commonly known as: MYCOSTATIN/NYSTOP Apply topically as needed. What changed:   how much to take  when to take this  reasons to take this   pantoprazole 40 MG tablet Commonly known as: PROTONIX Take 1 tablet (40 mg total) by mouth 2 (two) times daily before a meal. X 8 weeks   potassium chloride 10 MEQ tablet Commonly known as: KLOR-CON Take 1 tablet (10 mEq total) by mouth 2 (two) times daily.   ticagrelor 90 MG Tabs tablet Commonly known as: BRILINTA Take 1 tablet (90 mg total) by mouth 2 (two) times daily.   traMADol 50 MG tablet Commonly known as: ULTRAM Take 0.5 tablets (25 mg total) by mouth every 12 (twelve) hours as needed for moderate pain.   Ventolin HFA 108 (90 Base) MCG/ACT inhaler Generic drug: albuterol Inhale 2 puffs into the lungs 2 (two) times daily.   Vitamin D3 50 MCG (2000 UT) Tabs Take 2,000-4,000 Units by mouth See admin instructions. Take 2 tablets by mouth once a day on Mon/Wed/Fri and take 1 tablet on Sun/Tues/Thurs/Sat         DATA REVIEWED:  Lab Results  Component Value Date   HGBA1C 12.6 (H) 05/12/2019   HGBA1C 10.4 (A) 01/30/2019   HGBA1C 10.4 (H) 07/26/2018   Lab Results  Component Value Date   CREATININE 0.79 05/16/2019     ASSESSMENT / PLAN / RECOMMENDATIONS:   1) Type 2 Diabetes Mellitus, Poorly controlled, With macrovascular complications - Most recent A1c of 12.6 %. Goal A1c < 7.0 %.   Plan:  - Pt was discontinued the pump since her hospital discharge in 05/2019. I have explained to her that this is the right decision for her, as she is not  an active nor a safe pump user.  She has had incidents where the pump would malfunction and she wouldn't be aware of it.  - I have encouraged her to continue with MDI regiemn at this time, she had few BG readings on her meter and most of them have been in the hypoglycemic range. This is due to high basal dose, will adjust insulin regimen as below  - Pt had a BG reading of 61 mg/dL during the office visit today, this was corrected properly and repeat BG was 99 mg/dL.     MEDICATIONS: - Change Lantus to 48 units ONCE daily   - Continue Humalog 5 units with each meal   EDUCATION / INSTRUCTIONS:  BG monitoring instructions: Patient is instructed to check her blood sugars 4 times a day, before meals and bedtime.  Call [place] [facility] if: BG persistently < 70 or > 300. . I reviewed the Rule of 15 for the treatment of hypoglycemia in detail with the patient. Literature supplied.     F/U in 8 weeks    Signed electronically by: Mack Guise, MD  [facility] Endocrinology  [facility] [address] [place], [place] [address] Phone: [phone] FAX: [phone]   CC: [address] [place], [place] [address] Phone: [phone]  Fax: [phone]  Return to Endocrinology clinic as below: Future Appointments  Date Time Provider [place]  06/05/2019  9:30 AM Lorretta Harp, MD [facility]  06/07/2019  9:50 AM An Schnabel, Melanie Crazier, MD [facility] None

## 2019-05-18 NOTE — Patient Instructions (Signed)
-   Change Lantus to 48 units ONCE daily   - Continue Humalog 5 units with each meal     HOW TO TREAT LOW BLOOD SUGARS (Blood sugar LESS THAN 70 MG/DL)  Please follow the RULE OF 15 for the treatment of hypoglycemia treatment (when your (blood sugars are less than 70 mg/dL)    STEP 1: Take 15 grams of carbohydrates when your blood sugar is low, which includes:   3-4 GLUCOSE TABS  OR  3-4 OZ OF JUICE OR REGULAR SODA OR  ONE TUBE OF GLUCOSE GEL     STEP 2: RECHECK blood sugar in 15 MINUTES STEP 3: If your blood sugar is still low at the 15 minute recheck --> then, go back to STEP 1 and treat AGAIN with another 15 grams of carbohydrates.

## 2019-05-18 NOTE — Progress Notes (Signed)
Per request of Dr. Kelton Pillar, finger stick blood sugar was obtained from pt. Noted to be 66mg /dL. Pt was given graham crackers with peanut butter as well as a drink.

## 2019-05-18 NOTE — Progress Notes (Signed)
Subjective: Chief Complaint  Patient presents with  . Hospitalization Follow-up     HPI: Morgan [name] is a 53 y.o. presenting to clinic today to discuss the following:  [facility] F/U Discharge summary reviewed.  Patient admitted on 10/10 in DKA, discharged on 10/14 after resolution of DKA.  Patient had a mechanical fall in which she had injury at site of insertion of insulin pump, leading to DKA.  Of note, patient also hypokalemic to 3.3 on discharge, and had elevated beta hCG to 9.8 on i-STAT obtained in ED.  Urine pregnancy test was negative.  Missed appointment with Endocrinology and has not been using her pump because of that.  Scheduled for another appointment on 11/5.  Yesterday took 50u novolog TID yesterday and then today realized that she read the instructions wrong and it should have been 5u TID, so she took 5u this AM.  Also taking Lantus 30u BID as directed.  Yesterday, with taking 50u TID, she was in the 90s-low 100s until late at night.  Was 238 last PM, had pizza for dinner.  This AM was in 200s.  No polyuria, polydipsia, nausea, vomiting.     ROS noted in HPI. Chief complaint noted.  Other Pertinent PMH: T2DM, CAD, HFrEF Past Medical, Surgical, Social, and Family History Reviewed & Updated per EMR.      Social History   Tobacco Use  Smoking Status Former Smoker  . Packs/day: 2.00  . Years: 36.00  . Pack years: 72.00  . Types: Cigarettes  . Quit date: 05/14/2014  . Years since quitting: 5.0  Smokeless Tobacco Never Used   Smoking status noted.    Objective: BP (!) 100/56   Pulse 82   Wt 226 lb 9.6 oz (102.8 kg)   LMP 08/03/2013   SpO2 98%   BMI 40.14 kg/m  Vitals and nursing notes reviewed  Physical Exam:  General: 53 y.o. female in NAD Cardio: RRR  Lungs: CTAB, no wheezing, no rhonchi, no crackles, no IWOB on RA Abdomen: Soft, non-tender to palpation, non-distended, positive bowel sounds Skin: warm and dry, various regions of ecchymosis on  BUE 2/2 fall prior to hospitalization Extremities: walking boot on left foot   POC CBG: 114  Assessment/Plan:  Type 2 diabetes mellitus with hyperglycemia, with long-term current use of insulin (HCC) CBG within normal limits in office, 114.  Advised patient to call endocrinologist today to have follow-up appointment.  She should continue with current regimen until then.  Plans to restart insulin pump per endocrinology instructions once cleared by endocrinology to do so.  Advised to return to care if symptoms of hyperglycemia.  Hypokalemia K3.3 on discharge from hospital.  Will repeat BMP today.  S/P coronary artery stent placement Nitro refilled for patient at her request.  Elevated serum hCG Elevated hCG in hospital with negative urine pregnancy test.  Will repeat serum hCG quantitative today.  Vitamin D deficiency Patient reports a history of vitamin D deficiency and request for vitamin D levels to be checked.  Will obtain today.     PATIENT EDUCATION PROVIDED: See AVS    Diagnosis and plan along with any newly prescribed medication(s) were discussed in detail with this patient today. The patient verbalized understanding and agreed with the plan. Patient advised if symptoms worsen return to clinic or ER.   Health Maintainance: f/u PCP   Orders Placed This Encounter  Procedures  . Beta hCG quant (ref lab)  . Basic Metabolic Panel  .  VITAMIN D 25 Hydroxy (Vit-D Deficiency, Fractures)  . Glucose (CBG)    Meds ordered this encounter  Medications  . DISCONTD: nitroGLYCERIN (NITROSTAT) 0.4 MG SL tablet    Sig: Place 1 tablet (0.4 mg total) under the tongue every 5 (five) minutes x 3 doses as needed for chest pain.    Dispense:  30 tablet    Refill:  12  . nitroGLYCERIN (NITROSTAT) 0.4 MG SL tablet    Sig: Place 1 tablet (0.4 mg total) under the tongue every 5 (five) minutes x 3 doses as needed for chest pain.    Dispense:  30 tablet    Refill:  [address], DO 05/19/2019, 9:40 PM PGY-2 [place]

## 2019-05-19 DIAGNOSIS — E559 Vitamin D deficiency, unspecified: Secondary | ICD-10-CM | POA: Insufficient documentation

## 2019-05-19 LAB — BASIC METABOLIC PANEL
BUN/Creatinine Ratio: 8 — ABNORMAL LOW (ref 9–23)
BUN: 6 mg/dL (ref 6–24)
CO2: 20 mmol/L (ref 20–29)
Calcium: 9.7 mg/dL (ref 8.7–10.2)
Chloride: 106 mmol/L (ref 96–106)
Creatinine, Ser: 0.77 mg/dL (ref 0.57–1.00)
GFR calc Af Amer: 101 mL/min/{1.73_m2} (ref >59)
GFR calc non Af Amer: 87 mL/min/{1.73_m2} (ref >59)
Glucose: 74 mg/dL (ref 65–99)
Potassium: 3.1 mmol/L — ABNORMAL LOW (ref 3.5–5.2)
Sodium: 142 mmol/L (ref 134–144)

## 2019-05-19 LAB — BETA HCG QUANT (REF LAB): hCG Quant: 3 m[IU]/mL

## 2019-05-19 LAB — VITAMIN D 25 HYDROXY (VIT D DEFICIENCY, FRACTURES): Vit D, 25-Hydroxy: 35.9 ng/mL (ref 30.0–100.0)

## 2019-05-19 NOTE — Assessment & Plan Note (Addendum)
CBG within normal limits in office, 114.  Advised patient to call endocrinologist today to have follow-up appointment.  She should continue with current regimen until then.  Plans to restart insulin pump per endocrinology instructions once cleared by endocrinology to do so.  Advised to return to care if symptoms of hyperglycemia.

## 2019-05-19 NOTE — Assessment & Plan Note (Signed)
Elevated hCG in hospital with negative urine pregnancy test.  Will repeat serum hCG quantitative today.

## 2019-05-19 NOTE — Assessment & Plan Note (Signed)
Nitro refilled for patient at her request.

## 2019-05-19 NOTE — Assessment & Plan Note (Signed)
Patient reports a history of vitamin D deficiency and request for vitamin D levels to be checked.  Will obtain today.

## 2019-05-19 NOTE — Assessment & Plan Note (Signed)
K3.3 on discharge from hospital.  Will repeat BMP today.

## 2019-05-21 ENCOUNTER — Encounter: Payer: Self-pay | Admitting: Family Medicine

## 2019-05-21 ENCOUNTER — Other Ambulatory Visit: Payer: Self-pay | Admitting: Family Medicine

## 2019-05-21 DIAGNOSIS — E876 Hypokalemia: Secondary | ICD-10-CM

## 2019-05-21 MED ORDER — POTASSIUM CHLORIDE CRYS ER 20 MEQ PO TBCR: 20.0000 meq | EXTENDED_RELEASE_TABLET | Freq: Two times a day (BID) | ORAL | 0 refills | Status: DC

## 2019-05-21 NOTE — Progress Notes (Signed)
K 3.1 on check.  She will take 2 tabs x 2 days and then recheck.

## 2019-05-22 ENCOUNTER — Telehealth: Payer: Self-pay | Admitting: *Deleted

## 2019-05-22 NOTE — Telephone Encounter (Signed)
Spoke with patient.  Advised to take 89mEq BID today and then back to regularly schedueld 24mEq BID.  She states that she cannot come in until 10/27 for repeat labs.  Advised of symptoms of hypokalemia and encouraged her to call sooner if occurring.  Advised to go to ED if any chest pain.  Also noted to call if increase in diarrhea or if sugars are not well-controlled, as this can cause her potassium to further decrease.  She agrees to plan.

## 2019-05-22 NOTE — Telephone Encounter (Signed)
-----   Message from Cleophas Dunker, DO sent at 05/21/2019  8:32 AM EDT ----- Have attempted to call patient regarding potassium, but it won't go through.  I have sent message via mychart that I have sent potassium in to the pharmacy for her to use 2 tablets today and 2 tablets tomorrow, then come in the following day for labs.  Could you please try to call her to make sure she receives this message?  All other labs were fine

## 2019-05-22 NOTE — Telephone Encounter (Signed)
Contacted pt and informed her of below however her schedule this week does not allow for her to come in the office to have this checked.  Will route to doctor who ordered lab to be advised on next steps.Morgan [name], CMA

## 2019-05-23 ENCOUNTER — Other Ambulatory Visit: Payer: Self-pay

## 2019-05-23 DIAGNOSIS — E1165 Type 2 diabetes mellitus with hyperglycemia: Secondary | ICD-10-CM

## 2019-05-23 MED ORDER — INSULIN PEN NEEDLE 31G X 5 MM MISC: 1.0000 | Freq: Three times a day (TID) | 2 refills | Status: DC

## 2019-05-23 MED ORDER — INSULIN LISPRO (1 UNIT DIAL) 100 UNIT/ML (KWIKPEN): 5.0000 [IU] | PEN_INJECTOR | Freq: Three times a day (TID) | SUBCUTANEOUS | 6 refills | Status: DC

## 2019-05-23 MED ORDER — LANTUS SOLOSTAR 100 UNIT/ML ~~LOC~~ SOPN: 48.0000 [IU] | PEN_INJECTOR | Freq: Every day | SUBCUTANEOUS | 6 refills | Status: DC

## 2019-06-05 ENCOUNTER — Ambulatory Visit (INDEPENDENT_AMBULATORY_CARE_PROVIDER_SITE_OTHER): Payer: 59 | Admitting: Cardiovascular Disease

## 2019-06-05 ENCOUNTER — Telehealth: Payer: Self-pay | Admitting: Cardiovascular Disease

## 2019-06-05 ENCOUNTER — Encounter: Payer: Self-pay | Admitting: Cardiovascular Disease

## 2019-06-05 ENCOUNTER — Telehealth: Payer: Self-pay | Admitting: *Deleted

## 2019-06-05 VITALS — BP 120/72 | HR 72 | Temp 96.6°F | Ht 63.0 in | Wt 223.0 lb

## 2019-06-05 DIAGNOSIS — I2510 Atherosclerotic heart disease of native coronary artery without angina pectoris: Secondary | ICD-10-CM

## 2019-06-05 DIAGNOSIS — I771 Stricture of artery: Secondary | ICD-10-CM | POA: Diagnosis not present

## 2019-06-05 HISTORY — DX: Stricture of artery: I77.1

## 2019-06-05 LAB — CBC
Hematocrit: 45.6 % (ref 34.0–46.6)
Hemoglobin: 13.2 g/dL (ref 11.1–15.9)
MCH: 20.4 pg — ABNORMAL LOW (ref 26.6–33.0)
MCHC: 28.9 g/dL — ABNORMAL LOW (ref 31.5–35.7)
MCV: 70 fL — ABNORMAL LOW (ref 79–97)
Platelets: 385 10*3/uL (ref 150–450)
RBC: 6.48 x10E6/uL — ABNORMAL HIGH (ref 3.77–5.28)
RDW: 18.3 % — ABNORMAL HIGH (ref 11.7–15.4)
WBC: 10.5 10*3/uL (ref 3.4–10.8)

## 2019-06-05 LAB — BASIC METABOLIC PANEL
BUN/Creatinine Ratio: 16 (ref 9–23)
BUN: 13 mg/dL (ref 6–24)
CO2: 23 mmol/L (ref 20–29)
Calcium: 9.5 mg/dL (ref 8.7–10.2)
Chloride: 94 mmol/L — ABNORMAL LOW (ref 96–106)
Creatinine, Ser: 0.83 mg/dL (ref 0.57–1.00)
GFR calc Af Amer: 92 mL/min/{1.73_m2} (ref >59)
GFR calc non Af Amer: 80 mL/min/{1.73_m2} (ref >59)
Glucose: 519 mg/dL (ref 65–99)
Potassium: 4.7 mmol/L (ref 3.5–5.2)
Sodium: 136 mmol/L (ref 134–144)

## 2019-06-05 MED ORDER — SODIUM CHLORIDE 0.9% FLUSH: 3.0000 mL | Freq: Two times a day (BID) | INTRAVENOUS | Status: DC

## 2019-06-05 NOTE — Assessment & Plan Note (Signed)
Morgan [name] was referred to me by Dr. Johnsie Cancel for symptomatic left subclavian artery stenosis.  She has multiple risk factors include untreated hypertension, diabetes, hyperlipidemia and discontinue tobacco abuse as well as family history.  She had a left subclavian artery stenting at [facility] approximately 10 to 12 years ago.  Over the last year she has noticed left upper extremity claudication with subclavian steal symptoms.  She had Doppler studies performed 04/17/2019 revealing an occluded left subclavian artery with retrograde vertebral filling.  A CT angiogram performed 04/27/2019 confirmed an occluded left subclavian artery stent.  The patient is very symptomatic and this is lifestyle limiting.  We will we will plan to perform arch angiography potential percutaneous revascularization.  If this is not technically feasible she will need left common carotid to subclavian bypass grafting.

## 2019-06-05 NOTE — Telephone Encounter (Signed)
Patient states her employer want's to know what the plan is for short term leave for patients scheduled procedure on 06/14/19 with Dr. Gwenlyn Found.

## 2019-06-05 NOTE — H&P (View-Only) (Signed)
   06/05/2019 Morgan [name]   05/15/1966  [id number]  Primary Physician Shirley, Jordan, DO Primary Cardiologist: Yohanna Tow J Kenadi Miltner MD FACP, FACC, FAHA, FSCAI  HPI:  Morgan [name] is a 53 y.o. moderately overweight divorced Caucasian female mother of 2, grandmother 3 grandchildren referred to me by Dr. Nishan for peripheral vascular valuation because of symptomatic left subclavian artery occlusion.  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  Risk factors include discontinue tobacco abuse having smoked greater than 70 pack years, treated hypertension, diabetes and hyperlipidemia.  There is a family history of heart disease with both parents who died of myocardial infarction's.  She did have a non-STEMI 10/27/2018 and had LAD and diagonal branch intervention by Dr. Cooper with Onyx drug-eluting stents.  She has been stable from that point of view.  Over the last year she developed left upper extremity claudication with subclavian steal symptoms.  Carotid Dopplers performed 04/17/2019 suggest an occluded left subclavian artery with retrograde vertebral filling and a subsequent CT angiogram performed 04/27/2019 confirm this.   Current Meds  Medication Sig  . Accu-Chek FastClix Lancets MISC 1 Package by Does not apply route 3 (three) times daily.  . ADVAIR HFA 115-21 MCG/ACT inhaler Inhale 1 puff into the lungs 2 (two) times daily.  . aspirin 81 MG tablet Take 1 tablet (81 mg total) by mouth daily.  . atorvastatin (LIPITOR) 80 MG tablet Take 1 tablet (80 mg total) by mouth daily.  . Blood Glucose Monitoring Suppl (CONTOUR NEXT ONE) KIT 1 kit by Does not apply route as directed.  . butalbital-acetaminophen-caffeine (FIORICET, ESGIC) 50-325-40 MG tablet Take 1-2 tablets by mouth every 6 (six) hours as needed for headache or migraine.  . Cholecalciferol (VITAMIN D3) 50 MCG (2000 UT) TABS Take 2,000-4,000 Units by mouth See admin instructions. Take 2 tablets by mouth once a day on Mon/Wed/Fri  and take 1 tablet on Sun/Tues/Thurs/Sat  . EPINEPHrine 0.3 mg/0.3 mL IJ SOAJ injection Inject 0.3 mg into the muscle once as needed (for an anaphylactic reaction).   . FLUoxetine (PROZAC) 20 MG tablet Take 1 tablet (20 mg total) by mouth daily.  . furosemide (LASIX) 20 MG tablet Take 1 tablet (20 mg total) by mouth 2 (two) times daily.  . GLUCAGON EMERGENCY 1 MG injection Inject 1 mg into the vein once as needed (FOR ONSET OF HYPOGLYCEMIA).   . glucose blood (CONTOUR NEXT TEST) test strip Use as instructed  . HYDROcodone-acetaminophen (NORCO) 5-325 MG tablet Take 1 tablet by mouth every 4 (four) hours as needed for severe pain.  . Insulin Glargine (LANTUS SOLOSTAR) 100 UNIT/ML Solostar Pen Inject 48 Units into the skin daily.  . insulin lispro (HUMALOG KWIKPEN) 100 UNIT/ML KwikPen Inject 0.05 mLs (5 Units total) into the skin 3 (three) times daily. Inject 5 units under the skin 3 times daily before meals.  . Insulin Pen Needle 31G X 5 MM MISC 1 each by Does not apply route 3 (three) times daily.  . lamoTRIgine (LAMICTAL) 150 MG tablet Take 150 mg by mouth daily.  . levothyroxine (SYNTHROID, LEVOTHROID) 150 MCG tablet Take 150 mcg by mouth daily.   . losartan (COZAAR) 100 MG tablet Take 1 tablet (100 mg total) by mouth daily.  . Lurasidone HCl (LATUDA) 60 MG TABS Take 60 mg by mouth daily.  . metoprolol tartrate (LOPRESSOR) 50 MG tablet Take 1 tablet (50 mg total) by mouth 2 (two) times daily.  . Multiple Vitamin (MULTIVITAMIN WITH MINERALS) TABS   tablet Take 1 tablet by mouth daily.  . nitroGLYCERIN (NITROSTAT) 0.4 MG SL tablet Place 1 tablet (0.4 mg total) under the tongue every 5 (five) minutes x 3 doses as needed for chest pain.  . nystatin (MYCOSTATIN/NYSTOP) powder Apply topically as needed. (Patient taking differently: Apply 1 g topically daily as needed (infection). )  . pantoprazole (PROTONIX) 40 MG tablet Take 1 tablet (40 mg total) by mouth 2 (two) times daily before a meal. X 8 weeks  .  potassium chloride (K-DUR) 10 MEQ tablet Take 1 tablet (10 mEq total) by mouth 2 (two) times daily.  . potassium chloride SA (KLOR-CON) 20 MEQ tablet Take 1 tablet (20 mEq total) by mouth 2 (two) times daily.  . ticagrelor (BRILINTA) 90 MG TABS tablet Take 1 tablet (90 mg total) by mouth 2 (two) times daily.  . traMADol (ULTRAM) 50 MG tablet Take 0.5 tablets (25 mg total) by mouth every 12 (twelve) hours as needed for moderate pain.  . VENTOLIN HFA 108 (90 Base) MCG/ACT inhaler Inhale 2 puffs into the lungs 2 (two) times daily.     Allergies  Allergen Reactions  . Ilosone [Erythromycin] Anaphylaxis  . Keflex [Cephalexin] Anaphylaxis  . Penicillins Anaphylaxis    Has patient had a PCN reaction causing immediate rash, facial/tongue/throat swelling, SOB or lightheadedness with hypotension: YES Has patient had a PCN reaction causing severe rash involving mucus membranes or skin necrosis: NO Has patient had a PCN reaction that required hospitalizationNO Has patient had a PCN reaction occurring within the last 10 years: NO If all of the above answers are "NO", then may proceed with Cephalosporin use.  . Iron Other (See Comments)    Pt reports condition limiting Iron intake.  . Metformin And Related Other (See Comments)    Pt prefers to not take this medication  . Morphine And Related Other (See Comments)    Pt reports hallucinations    Social History   Socioeconomic History  . Marital status: Divorced    Spouse name: Not on file  . Number of children: 1  . Years of education: Not on file  . Highest education level: Not on file  Occupational History  . Occupation: telephone technical support  Social Needs  . Financial resource strain: Not very hard  . Food insecurity    Worry: Sometimes true    Inability: Sometimes true  . Transportation needs    Medical: No    Non-medical: No  Tobacco Use  . Smoking status: Former Smoker    Packs/day: 2.00    Years: 36.00    Pack years: 72.00     Types: Cigarettes    Quit date: 05/14/2014    Years since quitting: 5.0  . Smokeless tobacco: Never Used  Substance and Sexual Activity  . Alcohol use: Not Currently  . Drug use: Not Currently  . Sexual activity: Not Currently  Lifestyle  . Physical activity    Days per week: 1 day    Minutes per session: 10 min  . Stress: Only a little  Relationships  . Social connections    Talks on phone: Twice a week    Gets together: Twice a week    Attends religious service: 1 to 4 times per year    Active member of club or organization: No    Attends meetings of clubs or organizations: Never    Relationship status: Divorced  . Intimate partner violence    Fear of current or ex partner: No      Emotionally abused: No    Physically abused: No    Forced sexual activity: No  Other Topics Concern  . Not on file  Social History Narrative  . Not on file     Review of Systems: General: negative for chills, fever, night sweats or weight changes.  Cardiovascular: negative for chest pain, dyspnea on exertion, edema, orthopnea, palpitations, paroxysmal nocturnal dyspnea or shortness of breath Dermatological: negative for rash Respiratory: negative for cough or wheezing Urologic: negative for hematuria Abdominal: negative for nausea, vomiting, diarrhea, bright red blood per rectum, melena, or hematemesis Neurologic: negative for visual changes, syncope, or dizziness All other systems reviewed and are otherwise negative except as noted above.    Blood pressure 120/72, pulse 72, temperature (!) 96.6 F (35.9 C), height 5' 3" (1.6 m), weight 223 lb (101.2 kg), last menstrual period 08/03/2013, SpO2 96 %.  General appearance: alert and no distress Neck: no adenopathy, no JVD, supple, symmetrical, trachea midline, thyroid not enlarged, symmetric, no tenderness/mass/nodules and Soft right carotid bruit Lungs: clear to auscultation bilaterally Heart: regular rate and rhythm, S1, S2 normal, no  murmur, click, rub or gallop Extremities: extremities normal, atraumatic, no cyanosis or edema Pulses: 2+ and symmetric Skin: Skin color, texture, turgor normal. No rashes or lesions Neurologic: Alert and oriented X 3, normal strength and tone. Normal symmetric reflexes. Normal coordination and gait  EKG none performed today  ASSESSMENT AND PLAN:   Stenosis of left subclavian artery ([facility]) Ms. Muzzy was referred to me by Dr. Nishan for symptomatic left subclavian artery stenosis.  She has multiple risk factors include untreated hypertension, diabetes, hyperlipidemia and discontinue tobacco abuse as well as family history.  She had a left subclavian artery stenting at [facility] approximately 10 to 12 years ago.  Over the last year she has noticed left upper extremity claudication with subclavian steal symptoms.  She had Doppler studies performed 04/17/2019 revealing an occluded left subclavian artery with retrograde vertebral filling.  A CT angiogram performed 04/27/2019 confirmed an occluded left subclavian artery stent.  The patient is very symptomatic and this is lifestyle limiting.  We will we will plan to perform arch angiography potential percutaneous revascularization.  If this is not technically feasible she will need left common carotid to subclavian bypass grafting.      Eulogio Requena J. Brittnae Aschenbrenner MD FACP,FACC,FAHA, FSCAI 06/05/2019 10:15 AM 

## 2019-06-05 NOTE — Telephone Encounter (Signed)
Received call from [facility] regarding critical glucose, 519 Spoke with patient and she is feeling fine. Patient stated that her blood sugars vary and will be over 600 at time on her meter. Advised patient to contact her endocrinologist and will forward to Dr Gwenlyn Found for review

## 2019-06-05 NOTE — Patient Instructions (Signed)
    [place] [address] Dept: [id number] Loc: [place]  06/05/2019  You are scheduled for a Peripheral Angiogram on Thursday, November 12 with Dr. Quay Burow.  1. Please arrive at the [facility] (Main Entrance A) at [facility]: [address] [place], [place] [address] at 5:30 AM (This time is two hours before your procedure to ensure your preparation). Free valet parking service is available.   Special note: Every effort is made to have your procedure done on time. Please understand that emergencies sometimes delay scheduled procedures.  2. Diet: Do not eat solid foods after midnight.  The patient may have clear liquids until 5am upon the day of the procedure.  3. Labs: You will need to have blood drawn today.  4. Medication instructions in preparation for your procedure:  Take 1/2 dose of Insulin the night before procedure and none the morning of.  DO NOT take Metformin Thursday, Friday or Saturday. Restart on Sunday.   On the morning of your procedure, take your Brilinta/Ticagrelor and any morning medicines NOT listed above.  You may use sips of water.  5. Plan for one night stay--bring personal belongings. 6. Bring a current list of your medications and current insurance cards. 7. You MUST have a responsible person to drive you home. 8. Someone MUST be with you the first 24 hours after you arrive home or your discharge will be delayed. 9. Please wear clothes that are easy to get on and off and wear slip-on shoes.  Thank you for allowing [place] to care for you!   -- [facility]   Your physician has requested that you have a carotid duplex. This test is an ultrasound of the carotid arteries in your neck. It looks at blood flow through these arteries that supply the brain with blood. Allow one hour  for this exam. There are no restrictions or special instructions. One week after procedure on 11/12.   Your physician recommends that you schedule a follow-up appointment in: 2 weeks after procedure on 11/12 with Dr Gwenlyn Found.

## 2019-06-05 NOTE — Telephone Encounter (Signed)
What is the patients short term leave? How long will she be out of work due to the procedure? Will route to MD to advise.

## 2019-06-05 NOTE — Progress Notes (Signed)
   06/05/2019 Morgan [name]   12/20/1965  [id number]  Primary Physician Shirley, Jordan, DO Primary Cardiologist:  J  MD FACP, FACC, FAHA, FSCAI  HPI:  Morgan [name] is a 53 y.o. moderately overweight divorced Caucasian female mother of 2, grandmother 3 grandchildren referred to me by Dr. Nishan for peripheral vascular valuation because of symptomatic left subclavian artery occlusion.  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  Risk factors include discontinue tobacco abuse having smoked greater than 70 pack years, treated hypertension, diabetes and hyperlipidemia.  There is a family history of heart disease with both parents who died of myocardial infarction's.  She did have a non-STEMI 10/27/2018 and had LAD and diagonal branch intervention by Dr. Cooper with Onyx drug-eluting stents.  She has been stable from that point of view.  Over the last year she developed left upper extremity claudication with subclavian steal symptoms.  Carotid Dopplers performed 04/17/2019 suggest an occluded left subclavian artery with retrograde vertebral filling and a subsequent CT angiogram performed 04/27/2019 confirm this.   Current Meds  Medication Sig  . Accu-Chek FastClix Lancets MISC 1 Package by Does not apply route 3 (three) times daily.  . ADVAIR HFA 115-21 MCG/ACT inhaler Inhale 1 puff into the lungs 2 (two) times daily.  . aspirin 81 MG tablet Take 1 tablet (81 mg total) by mouth daily.  . atorvastatin (LIPITOR) 80 MG tablet Take 1 tablet (80 mg total) by mouth daily.  . Blood Glucose Monitoring Suppl (CONTOUR NEXT ONE) KIT 1 kit by Does not apply route as directed.  . butalbital-acetaminophen-caffeine (FIORICET, ESGIC) 50-325-40 MG tablet Take 1-2 tablets by mouth every 6 (six) hours as needed for headache or migraine.  . Cholecalciferol (VITAMIN D3) 50 MCG (2000 UT) TABS Take 2,000-4,000 Units by mouth See admin instructions. Take 2 tablets by mouth once a day on Mon/Wed/Fri  and take 1 tablet on Sun/Tues/Thurs/Sat  . EPINEPHrine 0.3 mg/0.3 mL IJ SOAJ injection Inject 0.3 mg into the muscle once as needed (for an anaphylactic reaction).   . FLUoxetine (PROZAC) 20 MG tablet Take 1 tablet (20 mg total) by mouth daily.  . furosemide (LASIX) 20 MG tablet Take 1 tablet (20 mg total) by mouth 2 (two) times daily.  . GLUCAGON EMERGENCY 1 MG injection Inject 1 mg into the vein once as needed (FOR ONSET OF HYPOGLYCEMIA).   . glucose blood (CONTOUR NEXT TEST) test strip Use as instructed  . HYDROcodone-acetaminophen (NORCO) 5-325 MG tablet Take 1 tablet by mouth every 4 (four) hours as needed for severe pain.  . Insulin Glargine (LANTUS SOLOSTAR) 100 UNIT/ML Solostar Pen Inject 48 Units into the skin daily.  . insulin lispro (HUMALOG KWIKPEN) 100 UNIT/ML KwikPen Inject 0.05 mLs (5 Units total) into the skin 3 (three) times daily. Inject 5 units under the skin 3 times daily before meals.  . Insulin Pen Needle 31G X 5 MM MISC 1 each by Does not apply route 3 (three) times daily.  . lamoTRIgine (LAMICTAL) 150 MG tablet Take 150 mg by mouth daily.  . levothyroxine (SYNTHROID, LEVOTHROID) 150 MCG tablet Take 150 mcg by mouth daily.   . losartan (COZAAR) 100 MG tablet Take 1 tablet (100 mg total) by mouth daily.  . Lurasidone HCl (LATUDA) 60 MG TABS Take 60 mg by mouth daily.  . metoprolol tartrate (LOPRESSOR) 50 MG tablet Take 1 tablet (50 mg total) by mouth 2 (two) times daily.  . Multiple Vitamin (MULTIVITAMIN WITH MINERALS) TABS   tablet Take 1 tablet by mouth daily.  . nitroGLYCERIN (NITROSTAT) 0.4 MG SL tablet Place 1 tablet (0.4 mg total) under the tongue every 5 (five) minutes x 3 doses as needed for chest pain.  . nystatin (MYCOSTATIN/NYSTOP) powder Apply topically as needed. (Patient taking differently: Apply 1 g topically daily as needed (infection). )  . pantoprazole (PROTONIX) 40 MG tablet Take 1 tablet (40 mg total) by mouth 2 (two) times daily before a meal. X 8 weeks  .  potassium chloride (K-DUR) 10 MEQ tablet Take 1 tablet (10 mEq total) by mouth 2 (two) times daily.  . potassium chloride SA (KLOR-CON) 20 MEQ tablet Take 1 tablet (20 mEq total) by mouth 2 (two) times daily.  . ticagrelor (BRILINTA) 90 MG TABS tablet Take 1 tablet (90 mg total) by mouth 2 (two) times daily.  . traMADol (ULTRAM) 50 MG tablet Take 0.5 tablets (25 mg total) by mouth every 12 (twelve) hours as needed for moderate pain.  . VENTOLIN HFA 108 (90 Base) MCG/ACT inhaler Inhale 2 puffs into the lungs 2 (two) times daily.     Allergies  Allergen Reactions  . Ilosone [Erythromycin] Anaphylaxis  . Keflex [Cephalexin] Anaphylaxis  . Penicillins Anaphylaxis    Has patient had a PCN reaction causing immediate rash, facial/tongue/throat swelling, SOB or lightheadedness with hypotension: YES Has patient had a PCN reaction causing severe rash involving mucus membranes or skin necrosis: NO Has patient had a PCN reaction that required hospitalizationNO Has patient had a PCN reaction occurring within the last 10 years: NO If all of the above answers are "NO", then may proceed with Cephalosporin use.  . Iron Other (See Comments)    Pt reports condition limiting Iron intake.  . Metformin And Related Other (See Comments)    Pt prefers to not take this medication  . Morphine And Related Other (See Comments)    Pt reports hallucinations    Social History   Socioeconomic History  . Marital status: Divorced    Spouse name: Not on file  . Number of children: 1  . Years of education: Not on file  . Highest education level: Not on file  Occupational History  . Occupation: telephone technical support  Social Needs  . Financial resource strain: Not very hard  . Food insecurity    Worry: Sometimes true    Inability: Sometimes true  . Transportation needs    Medical: No    Non-medical: No  Tobacco Use  . Smoking status: Former Smoker    Packs/day: 2.00    Years: 36.00    Pack years: 72.00     Types: Cigarettes    Quit date: 05/14/2014    Years since quitting: 5.0  . Smokeless tobacco: Never Used  Substance and Sexual Activity  . Alcohol use: Not Currently  . Drug use: Not Currently  . Sexual activity: Not Currently  Lifestyle  . Physical activity    Days per week: 1 day    Minutes per session: 10 min  . Stress: Only a little  Relationships  . Social connections    Talks on phone: Twice a week    Gets together: Twice a week    Attends religious service: 1 to 4 times per year    Active member of club or organization: No    Attends meetings of clubs or organizations: Never    Relationship status: Divorced  . Intimate partner violence    Fear of current or ex partner: No      Emotionally abused: No    Physically abused: No    Forced sexual activity: No  Other Topics Concern  . Not on file  Social History Narrative  . Not on file     Review of Systems: General: negative for chills, fever, night sweats or weight changes.  Cardiovascular: negative for chest pain, dyspnea on exertion, edema, orthopnea, palpitations, paroxysmal nocturnal dyspnea or shortness of breath Dermatological: negative for rash Respiratory: negative for cough or wheezing Urologic: negative for hematuria Abdominal: negative for nausea, vomiting, diarrhea, bright red blood per rectum, melena, or hematemesis Neurologic: negative for visual changes, syncope, or dizziness All other systems reviewed and are otherwise negative except as noted above.    Blood pressure 120/72, pulse 72, temperature (!) 96.6 F (35.9 C), height 5' 3" (1.6 m), weight 223 lb (101.2 kg), last menstrual period 08/03/2013, SpO2 96 %.  General appearance: alert and no distress Neck: no adenopathy, no JVD, supple, symmetrical, trachea midline, thyroid not enlarged, symmetric, no tenderness/mass/nodules and Soft right carotid bruit Lungs: clear to auscultation bilaterally Heart: regular rate and rhythm, S1, S2 normal, no  murmur, click, rub or gallop Extremities: extremities normal, atraumatic, no cyanosis or edema Pulses: 2+ and symmetric Skin: Skin color, texture, turgor normal. No rashes or lesions Neurologic: Alert and oriented X 3, normal strength and tone. Normal symmetric reflexes. Normal coordination and gait  EKG none performed today  ASSESSMENT AND PLAN:   Stenosis of left subclavian artery ([place]) Morgan [name] was referred to me by Dr. Johnsie Cancel for symptomatic left subclavian artery stenosis.  She has multiple risk factors include untreated hypertension, diabetes, hyperlipidemia and discontinue tobacco abuse as well as family history.  She had a left subclavian artery stenting at [facility] approximately 10 to 12 years ago.  Over the last year she has noticed left upper extremity claudication with subclavian steal symptoms.  She had Doppler studies performed 04/17/2019 revealing an occluded left subclavian artery with retrograde vertebral filling.  A CT angiogram performed 04/27/2019 confirmed an occluded left subclavian artery stent.  The patient is very symptomatic and this is lifestyle limiting.  We will we will plan to perform arch angiography potential percutaneous revascularization.  If this is not technically feasible she will need left common carotid to subclavian bypass grafting.      Lorretta Harp MD FACP,FACC,FAHA, [facility] 06/05/2019 10:15 AM

## 2019-06-06 ENCOUNTER — Ambulatory Visit: Payer: 59 | Admitting: Internal Medicine

## 2019-06-06 NOTE — Telephone Encounter (Signed)
Follow up    Pt is calling to follow up on her call from yesterday    Please call back

## 2019-06-06 NOTE — Telephone Encounter (Signed)
Called patient back- advised that I had not heard back from MD yet on this matter,  She also states that her job is wanting her to go out now- and not wait until she has to be quarantined due to making mistakes at work with typing with her hand- and missing days of work due to her health condition. I advised normally we do not sign for before the procedure- but I would have to get MD okay- when they send for short term disability if he would sign for her to go ahead and go out and then when she would be able to go back to work afterward. Will route to MD.  patient verbalized understanding.

## 2019-06-06 NOTE — Telephone Encounter (Signed)
Copy of labs faxed to PCP.

## 2019-06-07 ENCOUNTER — Ambulatory Visit: Payer: 59 | Admitting: Internal Medicine

## 2019-06-07 ENCOUNTER — Other Ambulatory Visit: Payer: Self-pay

## 2019-06-07 NOTE — Telephone Encounter (Signed)
Morgan [name] will be out of work for 2 or 3 days post procedure.

## 2019-06-08 NOTE — Telephone Encounter (Signed)
Attempted to contact patient to discuss message from MD- unable to reach patient, or leave voicemail.  Will route to triage pool to monitor.

## 2019-06-08 NOTE — Telephone Encounter (Signed)
Patient returned call- gave message from MD. Patient verbalized understanding.

## 2019-06-11 ENCOUNTER — Other Ambulatory Visit (HOSPITAL_COMMUNITY)
Admission: RE | Admit: 2019-06-11 | Discharge: 2019-06-11 | Disposition: A | Discharge status: Home / Self Care | Payer: 59 | Source: Ambulatory Visit | Attending: Cardiovascular Disease | Admitting: Cardiovascular Disease

## 2019-06-11 ENCOUNTER — Other Ambulatory Visit (HOSPITAL_COMMUNITY): Payer: 59

## 2019-06-11 DIAGNOSIS — Z20828 Contact with and (suspected) exposure to other viral communicable diseases: Secondary | ICD-10-CM | POA: Insufficient documentation

## 2019-06-11 DIAGNOSIS — Z01812 Encounter for preprocedural laboratory examination: Secondary | ICD-10-CM | POA: Diagnosis present

## 2019-06-12 ENCOUNTER — Telehealth: Payer: Self-pay | Admitting: *Deleted

## 2019-06-12 LAB — NOVEL CORONAVIRUS, NAA (HOSP ORDER, SEND-OUT TO REF LAB; TAT 18-24 HRS): SARS-CoV-2, NAA: NOT DETECTED

## 2019-06-12 NOTE — Telephone Encounter (Addendum)
Pt contacted pre-catheterization scheduled at [facility] for: Thursday June 14, 2019 7:30 AM Verified arrival time and place: [facility]) at: 5:30 AM  No solid food after midnight prior to cath, clear liquids until 5 AM day of procedure. Contrast allergy: no  Hold: Insulin-AM of procedure. 1/2 Insulin-PM prior to procedure Lasix/KCl-AM of procedure  Except hold medications AM meds can be  taken pre-cath with sip of water including: ASA 81 mg-pt ran out of 81 mg, she will take ASA 325 mg AM of procedure. Brilinta 90 mg   Confirmed patient has responsible adult to drive home post procedure and observe 24 hours after arriving home: yes  Currently, due to Covid-19 pandemic, only one support person will be allowed with patient. Must be the same support person for that patient's entire stay, will be screened and required to wear a mask. They will be asked to wait in the waiting room for the duration of the patient's stay.  Patients are required to wear a mask when they enter the hospital.      COVID-19 Pre-Screening Questions:  . In the past 7 to 10 days have you had a cough,  shortness of breath, headache, congestion, fever (100 or greater) body aches, chills, sore throat, or sudden loss of taste or sense of smell? no . Have you been around anyone with known Covid 19? no . Have you been around anyone who is awaiting Covid 19 test results in the past 7 to 10 days? no . Have you been around anyone who has been exposed to Covid 19, or has mentioned symptoms of Covid 19 within the past 7 to 10 days? no    I reviewed procedure/mask/visitor instructions, Covid-19 screening questions with patient, she verbalized understanding, thanked me for call.

## 2019-06-13 ENCOUNTER — Other Ambulatory Visit: Payer: Self-pay

## 2019-06-13 ENCOUNTER — Telehealth (INDEPENDENT_AMBULATORY_CARE_PROVIDER_SITE_OTHER): Payer: 59 | Admitting: Family Medicine

## 2019-06-13 DIAGNOSIS — Z794 Long term (current) use of insulin: Secondary | ICD-10-CM | POA: Diagnosis not present

## 2019-06-13 DIAGNOSIS — G8929 Other chronic pain: Secondary | ICD-10-CM | POA: Diagnosis not present

## 2019-06-13 DIAGNOSIS — R519 Headache, unspecified: Secondary | ICD-10-CM

## 2019-06-13 DIAGNOSIS — E1165 Type 2 diabetes mellitus with hyperglycemia: Secondary | ICD-10-CM

## 2019-06-13 NOTE — Progress Notes (Signed)
[place] Telemedicine Visit  Patient consented to have virtual visit. Method of visit: Video  Encounter participants: Patient: Morgan [name] - located at home Provider: [place] Keyna Blizard - located at [facility]  Others (if applicable): n/a  Chief Complaint: fatigue, dropping things  HPI: Patient reports that she has angioplasty scheduled for tomorrow at [facility]  Migraines She states overall she is not doing very well.  She states that her migraines have been worsening.  She states that she has to lay down and go to bed in order for these to stop.  She states she is getting migraines at times 2 times per day.  She is only free of migraines 2 days/week.  She states that she tries Tylenol which does not help and she will use some of the tramadol that she has.  She has previously been given then Fioricet but does not remember this helping so she has not taken that.  Diabetes She states that she has been working in order to get her blood sugars under better control but she has only able to get into the 350s.  She states that this month however they have been in the 600s a lot.  She denies any blood sugars below 120.  She plans to follow-up with her endocrinologist soon but wants to know what to do in the meantime.  She states she is taking Lantus 45 daily and Humalog 5 mg 3 times daily.  ROS: per HPI  Pertinent PMHx: migraines, T2DM, HTN, HFrEF, hypothyroidism  Exam:  General: looks well appearing Respiratory: able to speak in complete sentences without issue  Assessment/Plan:  Chronic nonintractable headache Patient needs to be seen in office after her procedure in order to have neuro evaluation.  However over the phone no red flags mentioned by patient.  Given that she is having increase in migraines would like to start prophylactic therapy.  Discussed with pharmacy given that patient is on multiple psych medications as well as other medications for her chronic diseases,  Topamax appears to be the best option for this patient.  We will discuss with her over my chart about starting this medication.  Type 2 diabetes mellitus with hyperglycemia, with long-term current use of insulin (HCC) CBGs remained severely elevated.  Instructed patient to follow-up with her endocrinologist as soon as she is able.  In the meantime she is to increase her Humalog to 10 mg 3 times daily with meals.  She will continue taking the 45 units of Lantus every night rather than sometimes decreasing to 40 units.  Patient again counseled on diet habits.   Time spent during visit with patient: 18 minutes  Martinique Ogden Handlin, DO PGY-3, [place]

## 2019-06-14 ENCOUNTER — Encounter (HOSPITAL_COMMUNITY): Payer: Self-pay | Admitting: General Practice

## 2019-06-14 ENCOUNTER — Other Ambulatory Visit: Payer: Self-pay

## 2019-06-14 ENCOUNTER — Ambulatory Visit (HOSPITAL_COMMUNITY)
Admission: RE | Admit: 2019-06-14 | Discharge: 2019-06-15 | Disposition: A | Discharge status: Home / Self Care | Payer: 59 | Attending: Cardiovascular Disease | Admitting: Cardiovascular Disease

## 2019-06-14 ENCOUNTER — Telehealth: Payer: Self-pay | Admitting: Cardiovascular Disease

## 2019-06-14 ENCOUNTER — Encounter (HOSPITAL_COMMUNITY)
Admission: RE | Disposition: A | Discharge status: Home / Self Care | Payer: Self-pay | Source: Home / Self Care | Attending: Cardiovascular Disease

## 2019-06-14 DIAGNOSIS — Z79890 Hormone replacement therapy (postmenopausal): Secondary | ICD-10-CM | POA: Diagnosis not present

## 2019-06-14 DIAGNOSIS — Z880 Allergy status to penicillin: Secondary | ICD-10-CM | POA: Diagnosis not present

## 2019-06-14 DIAGNOSIS — E1165 Type 2 diabetes mellitus with hyperglycemia: Secondary | ICD-10-CM | POA: Insufficient documentation

## 2019-06-14 DIAGNOSIS — E1151 Type 2 diabetes mellitus with diabetic peripheral angiopathy without gangrene: Secondary | ICD-10-CM | POA: Diagnosis present

## 2019-06-14 DIAGNOSIS — Z7982 Long term (current) use of aspirin: Secondary | ICD-10-CM | POA: Insufficient documentation

## 2019-06-14 DIAGNOSIS — Z881 Allergy status to other antibiotic agents status: Secondary | ICD-10-CM | POA: Insufficient documentation

## 2019-06-14 DIAGNOSIS — I771 Stricture of artery: Secondary | ICD-10-CM

## 2019-06-14 DIAGNOSIS — J449 Chronic obstructive pulmonary disease, unspecified: Secondary | ICD-10-CM | POA: Insufficient documentation

## 2019-06-14 DIAGNOSIS — I708 Atherosclerosis of other arteries: Secondary | ICD-10-CM | POA: Diagnosis not present

## 2019-06-14 DIAGNOSIS — E785 Hyperlipidemia, unspecified: Secondary | ICD-10-CM | POA: Diagnosis not present

## 2019-06-14 DIAGNOSIS — K219 Gastro-esophageal reflux disease without esophagitis: Secondary | ICD-10-CM | POA: Insufficient documentation

## 2019-06-14 DIAGNOSIS — I70212 Atherosclerosis of native arteries of extremities with intermittent claudication, left leg: Secondary | ICD-10-CM | POA: Diagnosis not present

## 2019-06-14 DIAGNOSIS — Z8249 Family history of ischemic heart disease and other diseases of the circulatory system: Secondary | ICD-10-CM | POA: Insufficient documentation

## 2019-06-14 DIAGNOSIS — Z8673 Personal history of transient ischemic attack (TIA), and cerebral infarction without residual deficits: Secondary | ICD-10-CM | POA: Insufficient documentation

## 2019-06-14 DIAGNOSIS — Z955 Presence of coronary angioplasty implant and graft: Secondary | ICD-10-CM | POA: Diagnosis not present

## 2019-06-14 DIAGNOSIS — K760 Fatty (change of) liver, not elsewhere classified: Secondary | ICD-10-CM | POA: Insufficient documentation

## 2019-06-14 DIAGNOSIS — Z885 Allergy status to narcotic agent status: Secondary | ICD-10-CM | POA: Diagnosis not present

## 2019-06-14 DIAGNOSIS — Z7951 Long term (current) use of inhaled steroids: Secondary | ICD-10-CM | POA: Insufficient documentation

## 2019-06-14 DIAGNOSIS — Z794 Long term (current) use of insulin: Secondary | ICD-10-CM

## 2019-06-14 DIAGNOSIS — I252 Old myocardial infarction: Secondary | ICD-10-CM | POA: Insufficient documentation

## 2019-06-14 DIAGNOSIS — E039 Hypothyroidism, unspecified: Secondary | ICD-10-CM | POA: Insufficient documentation

## 2019-06-14 DIAGNOSIS — I2510 Atherosclerotic heart disease of native coronary artery without angina pectoris: Secondary | ICD-10-CM | POA: Diagnosis not present

## 2019-06-14 DIAGNOSIS — Z79899 Other long term (current) drug therapy: Secondary | ICD-10-CM | POA: Diagnosis not present

## 2019-06-14 DIAGNOSIS — I10 Essential (primary) hypertension: Secondary | ICD-10-CM | POA: Insufficient documentation

## 2019-06-14 DIAGNOSIS — E876 Hypokalemia: Secondary | ICD-10-CM | POA: Diagnosis not present

## 2019-06-14 DIAGNOSIS — Z87891 Personal history of nicotine dependence: Secondary | ICD-10-CM | POA: Diagnosis not present

## 2019-06-14 HISTORY — PX: AORTIC ARCH ANGIOGRAPHY: CATH118224

## 2019-06-14 HISTORY — PX: UPPER EXTREMITY ANGIOGRAPHY: CATH118270

## 2019-06-14 LAB — GLUCOSE, CAPILLARY
Glucose-Capillary: 474 mg/dL — ABNORMAL HIGH (ref 70–99)
Glucose-Capillary: 440 mg/dL — ABNORMAL HIGH (ref 70–99)
Glucose-Capillary: 368 mg/dL — ABNORMAL HIGH (ref 70–99)
Glucose-Capillary: 251 mg/dL — ABNORMAL HIGH (ref 70–99)
Glucose-Capillary: 308 mg/dL — ABNORMAL HIGH (ref 70–99)
Glucose-Capillary: 394 mg/dL — ABNORMAL HIGH (ref 70–99)

## 2019-06-14 SURGERY — UPPER EXTREMITY ANGIOGRAPHY
Anesthesia: LOCAL | Laterality: Right

## 2019-06-14 MED ORDER — POTASSIUM CHLORIDE CRYS ER 10 MEQ PO TBCR
10.0000 meq | EXTENDED_RELEASE_TABLET | Freq: Two times a day (BID) | ORAL | Status: DC
  Administered 2019-06-14 – 2019-06-15 (×2): 10 meq via ORAL
  Filled 2019-06-14 (×4): qty 1

## 2019-06-14 MED ORDER — INSULIN ASPART 100 UNIT/ML ~~LOC~~ SOLN
10.0000 [IU] | Freq: Once | SUBCUTANEOUS | Status: AC
  Administered 2019-06-14: 10 [IU] via SUBCUTANEOUS

## 2019-06-14 MED ORDER — MIDAZOLAM HCL 2 MG/2ML IJ SOLN
INTRAMUSCULAR | Status: DC | PRN
  Administered 2019-06-14: 1 mg via INTRAVENOUS

## 2019-06-14 MED ORDER — INSULIN GLARGINE 100 UNIT/ML ~~LOC~~ SOLN
48.0000 [IU] | Freq: Every day | SUBCUTANEOUS | Status: DC
  Administered 2019-06-14 – 2019-06-15 (×2): 48 [IU] via SUBCUTANEOUS
  Filled 2019-06-14 (×2): qty 0.48

## 2019-06-14 MED ORDER — PANTOPRAZOLE SODIUM 40 MG PO TBEC
40.0000 mg | DELAYED_RELEASE_TABLET | Freq: Two times a day (BID) | ORAL | Status: DC
  Administered 2019-06-14 – 2019-06-15 (×2): 40 mg via ORAL
  Filled 2019-06-14 (×2): qty 1

## 2019-06-14 MED ORDER — ASPIRIN EC 81 MG PO TBEC: 81.0000 mg | DELAYED_RELEASE_TABLET | Freq: Every day | ORAL | Status: DC

## 2019-06-14 MED ORDER — ACETAMINOPHEN 325 MG PO TABS
650.0000 mg | ORAL_TABLET | ORAL | Status: DC | PRN
  Administered 2019-06-14 – 2019-06-15 (×3): 650 mg via ORAL
  Filled 2019-06-14 (×3): qty 2

## 2019-06-14 MED ORDER — FLUOXETINE HCL 20 MG PO CAPS
20.0000 mg | ORAL_CAPSULE | Freq: Every day | ORAL | Status: DC
  Administered 2019-06-14 – 2019-06-15 (×2): 20 mg via ORAL
  Filled 2019-06-14 (×2): qty 1

## 2019-06-14 MED ORDER — SODIUM CHLORIDE 0.9 % IV SOLN: 250.0000 mL | INTRAVENOUS | Status: DC | PRN

## 2019-06-14 MED ORDER — TICAGRELOR 90 MG PO TABS
90.0000 mg | ORAL_TABLET | Freq: Two times a day (BID) | ORAL | Status: DC
  Administered 2019-06-14 – 2019-06-15 (×2): 90 mg via ORAL
  Filled 2019-06-14 (×3): qty 1

## 2019-06-14 MED ORDER — SODIUM CHLORIDE 0.9 % WEIGHT BASED INFUSION
3.0000 mL/kg/h | INTRAVENOUS | Status: DC
  Administered 2019-06-14: 3 mL/kg/h via INTRAVENOUS

## 2019-06-14 MED ORDER — TRAMADOL HCL 50 MG PO TABS
25.0000 mg | ORAL_TABLET | Freq: Two times a day (BID) | ORAL | Status: DC | PRN
  Administered 2019-06-15: 25 mg via ORAL
  Filled 2019-06-14: qty 1

## 2019-06-14 MED ORDER — ASPIRIN 81 MG PO CHEW
CHEWABLE_TABLET | ORAL | Status: AC
  Administered 2019-06-14: 06:00:00 81 mg
  Filled 2019-06-14: qty 1

## 2019-06-14 MED ORDER — FUROSEMIDE 20 MG PO TABS
20.0000 mg | ORAL_TABLET | Freq: Two times a day (BID) | ORAL | Status: DC
  Administered 2019-06-14 – 2019-06-15 (×2): 20 mg via ORAL
  Filled 2019-06-14 (×2): qty 1

## 2019-06-14 MED ORDER — SODIUM CHLORIDE 0.9% FLUSH: 3.0000 mL | INTRAVENOUS | Status: DC | PRN

## 2019-06-14 MED ORDER — INSULIN ASPART 100 UNIT/ML ~~LOC~~ SOLN
SUBCUTANEOUS | Status: AC
  Filled 2019-06-14: qty 1

## 2019-06-14 MED ORDER — HEPARIN (PORCINE) IN NACL 1000-0.9 UT/500ML-% IV SOLN
INTRAVENOUS | Status: AC
  Filled 2019-06-14: qty 1000

## 2019-06-14 MED ORDER — FENTANYL CITRATE (PF) 100 MCG/2ML IJ SOLN
INTRAMUSCULAR | Status: AC
  Filled 2019-06-14: qty 2

## 2019-06-14 MED ORDER — ATORVASTATIN CALCIUM 80 MG PO TABS
80.0000 mg | ORAL_TABLET | Freq: Every day | ORAL | Status: DC
  Administered 2019-06-14: 80 mg via ORAL
  Filled 2019-06-14: qty 1

## 2019-06-14 MED ORDER — FENTANYL CITRATE (PF) 100 MCG/2ML IJ SOLN
INTRAMUSCULAR | Status: DC | PRN
  Administered 2019-06-14: 25 ug via INTRAVENOUS

## 2019-06-14 MED ORDER — INSULIN PEN NEEDLE 31G X 5 MM MISC: 1.0000 | Freq: Three times a day (TID) | Status: DC

## 2019-06-14 MED ORDER — ALBUTEROL SULFATE (2.5 MG/3ML) 0.083% IN NEBU
3.0000 mL | INHALATION_SOLUTION | Freq: Two times a day (BID) | RESPIRATORY_TRACT | Status: DC
  Administered 2019-06-14 – 2019-06-15 (×2): 3 mL via RESPIRATORY_TRACT
  Filled 2019-06-14 (×2): qty 3

## 2019-06-14 MED ORDER — SODIUM CHLORIDE 0.9% FLUSH
3.0000 mL | Freq: Two times a day (BID) | INTRAVENOUS | Status: DC
  Administered 2019-06-14 – 2019-06-15 (×3): 3 mL via INTRAVENOUS

## 2019-06-14 MED ORDER — INSULIN LISPRO (1 UNIT DIAL) 100 UNIT/ML (KWIKPEN): 5.0000 [IU] | PEN_INJECTOR | Freq: Three times a day (TID) | SUBCUTANEOUS | Status: DC

## 2019-06-14 MED ORDER — LAMOTRIGINE 100 MG PO TABS
200.0000 mg | ORAL_TABLET | Freq: Every day | ORAL | Status: DC
  Administered 2019-06-14 – 2019-06-15 (×2): 200 mg via ORAL
  Filled 2019-06-14: qty 2
  Filled 2019-06-14: qty 8
  Filled 2019-06-14: qty 2
  Filled 2019-06-14: qty 8

## 2019-06-14 MED ORDER — HEPARIN (PORCINE) IN NACL 1000-0.9 UT/500ML-% IV SOLN
INTRAVENOUS | Status: DC | PRN
  Administered 2019-06-14 (×2): 500 mL

## 2019-06-14 MED ORDER — HYDRALAZINE HCL 20 MG/ML IJ SOLN: 5.0000 mg | INTRAMUSCULAR | Status: DC | PRN

## 2019-06-14 MED ORDER — LEVOTHYROXINE SODIUM 150 MCG PO TABS
150.0000 ug | ORAL_TABLET | Freq: Every day | ORAL | Status: DC
  Administered 2019-06-14: 22:00:00 150 ug via ORAL
  Filled 2019-06-14 (×2): qty 1
  Filled 2019-06-14: qty 2

## 2019-06-14 MED ORDER — LOSARTAN POTASSIUM 50 MG PO TABS
100.0000 mg | ORAL_TABLET | Freq: Every day | ORAL | Status: DC
  Administered 2019-06-15: 100 mg via ORAL
  Filled 2019-06-14: qty 2

## 2019-06-14 MED ORDER — IODIXANOL 320 MG/ML IV SOLN
INTRAVENOUS | Status: DC | PRN
  Administered 2019-06-14: 45 mL

## 2019-06-14 MED ORDER — MIDAZOLAM HCL 2 MG/2ML IJ SOLN
INTRAMUSCULAR | Status: AC
  Filled 2019-06-14: qty 2

## 2019-06-14 MED ORDER — ACCU-CHEK FASTCLIX LANCETS MISC: 1.0000 | Freq: Three times a day (TID) | Status: DC

## 2019-06-14 MED ORDER — LABETALOL HCL 5 MG/ML IV SOLN: 10.0000 mg | INTRAVENOUS | Status: DC | PRN

## 2019-06-14 MED ORDER — CONTOUR NEXT ONE KIT: 1.0000 | PACK | Status: DC

## 2019-06-14 MED ORDER — ASPIRIN 81 MG PO CHEW: 81.0000 mg | CHEWABLE_TABLET | ORAL | Status: DC

## 2019-06-14 MED ORDER — SODIUM CHLORIDE 0.9 % IV SOLN: INTRAVENOUS | Status: AC

## 2019-06-14 MED ORDER — SODIUM CHLORIDE 0.9 % WEIGHT BASED INFUSION: 1.0000 mL/kg/h | INTRAVENOUS | Status: DC

## 2019-06-14 MED ORDER — ASPIRIN EC 81 MG PO TBEC
81.0000 mg | DELAYED_RELEASE_TABLET | Freq: Every day | ORAL | Status: DC
  Administered 2019-06-15: 11:00:00 81 mg via ORAL
  Filled 2019-06-14: qty 1

## 2019-06-14 MED ORDER — LIDOCAINE HCL (PF) 1 % IJ SOLN
INTRAMUSCULAR | Status: DC | PRN
  Administered 2019-06-14: 25 mL

## 2019-06-14 MED ORDER — METOPROLOL TARTRATE 50 MG PO TABS
50.0000 mg | ORAL_TABLET | Freq: Two times a day (BID) | ORAL | Status: DC
  Administered 2019-06-14 – 2019-06-15 (×2): 50 mg via ORAL
  Filled 2019-06-14 (×2): qty 1

## 2019-06-14 MED ORDER — SODIUM CHLORIDE 0.9% FLUSH
3.0000 mL | Freq: Two times a day (BID) | INTRAVENOUS | Status: DC
  Administered 2019-06-15: 3 mL via INTRAVENOUS

## 2019-06-14 MED ORDER — INSULIN ASPART 100 UNIT/ML ~~LOC~~ SOLN
5.0000 [IU] | Freq: Three times a day (TID) | SUBCUTANEOUS | Status: DC
  Administered 2019-06-14 – 2019-06-15 (×4): 5 [IU] via SUBCUTANEOUS

## 2019-06-14 SURGICAL SUPPLY — 12 items
CATH ANGIO 5F PIGTAIL 100CM (CATHETERS) ×3 IMPLANT
CATH INFINITI JR4 5F (CATHETERS) ×3 IMPLANT
CLOSURE MYNX CONTROL 5F (Vascular Products) ×3 IMPLANT
KIT PV (KITS) ×3 IMPLANT
SHEATH PINNACLE 5F 10CM (SHEATH) ×3 IMPLANT
SHEATH PROBE COVER 6X72 (BAG) ×3 IMPLANT
STOPCOCK MORSE 400PSI 3WAY (MISCELLANEOUS) ×3 IMPLANT
SYR MEDRAD MARK 7 150ML (SYRINGE) ×3 IMPLANT
TRANSDUCER W/STOPCOCK (MISCELLANEOUS) ×3 IMPLANT
TRAY PV CATH (CUSTOM PROCEDURE TRAY) ×3 IMPLANT
TUBING CIL FLEX 10 FLL-RA (TUBING) ×3 IMPLANT
WIRE HI TORQ VERSACORE J 260CM (WIRE) ×3 IMPLANT

## 2019-06-14 NOTE — Progress Notes (Addendum)
Dr Gwenlyn Found called about CBG results. Awaiting call back  Rennis Harding, Rn informed pt does not have any one to stay with them over night.

## 2019-06-14 NOTE — Telephone Encounter (Signed)
Morgan [name] [facility] Case manager called. The patient is currently enrolled in a case management program, but the program had been unable to reach her. At the time of the call. The patient was admitted to the hospital. [place] requests that our office recommend she utilize the case management program, and if our office could remind her at any future follow up appointments [name] would appreciate it

## 2019-06-14 NOTE — Interval H&P Note (Signed)
History and Physical Interval Note:  06/14/2019 7:51 AM  Morgan [name]  has presented today for surgery, with the diagnosis of peripheral vascular disease.  The various methods of treatment have been discussed with the patient and family. After consideration of risks, benefits and other options for treatment, the patient has consented to  Procedure(s): UPPER EXTREMITY ANGIOGRAPHY (N/A) as a surgical intervention.  The patient's history has been reviewed, patient examined, no change in status, stable for surgery.  I have reviewed the patient's chart and labs.  Questions were answered to the patient's satisfaction.     Quay Burow

## 2019-06-14 NOTE — Progress Notes (Signed)
Patient arrived from cath lab to 4E room 26.  Telemetry monitor applied and CCMD notified.  Patient oriented to unit and room to include call light and phone.  Will continue to monitor.

## 2019-06-14 NOTE — Telephone Encounter (Signed)
Will route to primary nurse to make aware.

## 2019-06-14 NOTE — Consult Note (Signed)
Hospital Consult    Reason for Consult: Left subclavian steal, occluded left subclavian stent Referring Physician: Dr. Gwenlyn Found, cardiology MRN #:  [id number]  History of Present Illness: This is a 53 y.o. female with history of hypertension, hyperlipidemia, diabetes, coronary artery disease status post NSTEMI 10/27/2018 with drug-eluting stents that vascular surgery has been consulted for left subclavian stent occlusion and left subclavian steal syndrome.  Patient reportedly had a stent placed about 10 years ago at [facility].  Dr. Gwenlyn Found reports that she has been having increasing left upper extremity claudication over the last year.  She states that she has trouble typing at times and makes a lot of errors with her left hand.  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  She also endorses some dizziness that has been ongoing for the last 2 to 3 weeks.  Also reports having some trouble holding a cup with her left hand.  No head or neck surgery other than tonsillectomy.  Past Medical History:  Diagnosis Date  . Anxiety   . Asthma   . Bipolar depression ([place])   . Chronic bronchitis ([place])   . Chronic esophagogastric ulcer   . Chronic stomach ulcer   . Fibroid   . GERD (gastroesophageal reflux disease)   . Heart murmur   . Hepatic steatosis   . History of blood transfusion 2010   "related to subclavian stent"  . History of hiatal hernia   . Hyperlipidemia   . Hypertension   . Hypothyroid   . Migraine    "cerebral migraines; 1-2/month" (06/29/2018)  . Non-ST elevation (NSTEMI) myocardial infarction ([place]) 10/27/2018  . 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 Stroke [facility])    "I've had 2; most recent one was ~ 2015 or before; affected balance" (06/29/2018)  . Thalassemia minor   . Type II diabetes mellitus ([place])   . Vitamin D deficiency     Past Surgical History:  Procedure Laterality Date  . ABDOMINAL HYSTERECTOMY  2015   "still have 1 ovary"  . BIOPSY  07/25/2018   Procedure: BIOPSY;  Surgeon: Thornton Park, MD;  Location[facility] ENDOSCOPY;  Service: Gastroenterology;;  . Elim; 1996  . CORONARY STENT INTERVENTION N/A 10/27/2018   Procedure: CORONARY STENT INTERVENTION;  Surgeon: Sherren Mocha, MD;  Location: [place] CV LAB;  Service: Cardiovascular;  Laterality: N/A;  . ESOPHAGOGASTRODUODENOSCOPY (EGD) WITH PROPOFOL N/A 07/25/2018   Procedure: ESOPHAGOGASTRODUODENOSCOPY (EGD) WITH PROPOFOL;  Surgeon: Thornton Park, MD;  Location: [place];  Service: Gastroenterology;  Laterality: N/A;  . KNEE RECONSTRUCTION Left 1980  . LAPAROSCOPIC CHOLECYSTECTOMY    . LEFT HEART CATH AND CORONARY ANGIOGRAPHY N/A 10/27/2018   Procedure: LEFT HEART CATH AND CORONARY ANGIOGRAPHY;  Surgeon: Sherren Mocha, MD;  Location: [place] CV LAB;  Service: Cardiovascular;  Laterality: N/A;  . SUBCLAVIAN STENT PLACEMENT Left 2010  . TONSILLECTOMY  1970    Allergies  Allergen Reactions  . Ilosone [Erythromycin] Anaphylaxis  . Keflex [Cephalexin] Anaphylaxis  . Penicillins Anaphylaxis    Has patient had a PCN reaction causing immediate rash, facial/tongue/throat swelling, SOB or lightheadedness with hypotension: YES Has patient had a PCN reaction causing severe rash involving mucus membranes or skin necrosis: NO Has patient had a PCN reaction that required hospitalizationNO Has patient had a PCN reaction occurring within the last 10 years: NO If all of the above answers are "NO", then may proceed with Cephalosporin use.  . Iron Other (See Comments)    Pt  reports condition limiting Iron intake.  . Metformin And Related Other (See Comments)    Pt prefers to not take this medication Side effects  . Morphine And Related Other (See Comments)    Pt reports hallucinations    Prior to Admission medications   Medication Sig Start Date End Date Taking? Authorizing Provider  ADVAIR HFA 115-21 MCG/ACT inhaler Inhale 1 puff into the lungs 2 (two) times  daily. Patient taking differently: Inhale 2 puffs into the lungs 2 (two) times daily.  04/05/19  Yes Shirley, [place], DO  aspirin 81 MG tablet Take 1 tablet (81 mg total) by mouth daily. 10/31/18  Yes Regalado, Belkys A, MD  atorvastatin (LIPITOR) 80 MG tablet Take 1 tablet (80 mg total) by mouth daily. Patient taking differently: Take 80 mg by mouth at bedtime.  04/03/19  Yes Josue Hector, MD  Cholecalciferol (VITAMIN D3) 50 MCG (2000 UT) TABS Take 2,000-4,000 Units by mouth See admin instructions. Take 2 tablets by mouth once a day on Mon/Wed/Fri and take 1 tablet on Sun/Tues/Thurs/Sat   Yes [provider]  FLUoxetine (PROZAC) 20 MG tablet Take 1 tablet (20 mg total) by mouth daily. Patient taking differently: Take 40 mg by mouth daily.  05/16/19  Yes Rory Percy, DO  furosemide (LASIX) 20 MG tablet Take 1 tablet (20 mg total) by mouth 2 (two) times daily. 12/11/18  Yes Josue Hector, MD  GLUCAGON EMERGENCY 1 MG injection Inject 1 mg into the vein once as needed (FOR ONSET OF HYPOGLYCEMIA).  02/09/18  Yes [provider]  Insulin Glargine (LANTUS SOLOSTAR) 100 UNIT/ML Solostar Pen Inject 48 Units into the skin daily. Patient taking differently: Inject 45 Units into the skin daily.  05/23/19  Yes Renato Shin, MD  insulin lispro (HUMALOG KWIKPEN) 100 UNIT/ML KwikPen Inject 0.05 mLs (5 Units total) into the skin 3 (three) times daily. Inject 5 units under the skin 3 times daily before meals. 05/23/19  Yes Renato Shin, MD  lamoTRIgine (LAMICTAL) 200 MG tablet Take 200 mg by mouth daily.  11/29/18  Yes [provider]  levothyroxine (SYNTHROID, LEVOTHROID) 150 MCG tablet Take 150 mcg by mouth at bedtime.    Yes [provider]  losartan (COZAAR) 100 MG tablet Take 1 tablet (100 mg total) by mouth daily. 04/03/19  Yes Josue Hector, MD  Lurasidone HCl (LATUDA) 60 MG TABS Take 60 mg by mouth at bedtime.    Yes [provider]  metoprolol tartrate  (LOPRESSOR) 50 MG tablet Take 1 tablet (50 mg total) by mouth 2 (two) times daily. 04/04/19  Yes Josue Hector, MD  Multiple Vitamin (MULTIVITAMIN WITH MINERALS) TABS tablet Take 1 tablet by mouth daily.   Yes [provider]  nystatin (MYCOSTATIN/NYSTOP) powder Apply topically as needed. Patient taking differently: Apply 1 g topically daily as needed (infection).  02/27/19  Yes Enid Derry, [place], DO  nystatin ointment (MYCOSTATIN) Apply 1 application topically daily as needed (infection).   Yes [provider]  pantoprazole (PROTONIX) 40 MG tablet Take 1 tablet (40 mg total) by mouth 2 (two) times daily before a meal. X 8 weeks Patient taking differently: Take 40 mg by mouth daily. Additional 20 mg as needed 07/26/18  Yes Rai, Ripudeep K, MD  potassium chloride SA (KLOR-CON) 20 MEQ tablet Take 1 tablet (20 mEq total) by mouth 2 (two) times daily. 05/21/19  Yes Meccariello, Bernita Raisin, DO  ticagrelor (BRILINTA) 90 MG TABS tablet Take 1 tablet (90 mg total) by  mouth 2 (two) times daily. 04/04/19  Yes Josue Hector, MD  traMADol (ULTRAM) 50 MG tablet Take 0.5 tablets (25 mg total) by mouth every 12 (twelve) hours as needed for moderate pain. Patient taking differently: Take 25 mg by mouth every 12 (twelve) hours as needed (Migraine).  10/31/18  Yes Regalado, Belkys A, MD  VENTOLIN HFA 108 (90 Base) MCG/ACT inhaler Inhale 2 puffs into the lungs 2 (two) times daily. Patient taking differently: Inhale 2 puffs into the lungs 2 (two) times daily as needed for wheezing or shortness of breath.  04/05/19  Yes Enid Derry, [place], DO  Accu-Chek FastClix Lancets MISC 1 Package by Does not apply route 3 (three) times daily. 11/17/18   Shamleffer, Melanie Crazier, MD  Blood Glucose Monitoring Suppl (CONTOUR NEXT ONE) KIT 1 kit by Does not apply route as directed. 07/02/18   Patrecia Pour, MD  butalbital-acetaminophen-caffeine (FIORICET, ESGIC) [phone] MG tablet Take 1-2 tablets by mouth every 6 (six) hours as  needed for headache or migraine. 07/26/18 07/26/19  Rai, Ripudeep K, MD  EPINEPHrine 0.3 mg/0.3 mL IJ SOAJ injection Inject 0.3 mg into the muscle once as needed for anaphylaxis (for an anaphylactic reaction).  02/08/18   [provider]  glucose blood (CONTOUR NEXT TEST) test strip Use as instructed 01/30/19   Shamleffer, Melanie Crazier, MD  HYDROcodone-acetaminophen (NORCO) 5-325 MG tablet Take 1 tablet by mouth every 4 (four) hours as needed for severe pain. 03/14/19   Sherwood Gambler, MD  Insulin Pen Needle 31G X 5 MM MISC 1 each by Does not apply route 3 (three) times daily. 05/23/19   Renato Shin, MD  nitroGLYCERIN (NITROSTAT) 0.4 MG SL tablet Place 1 tablet (0.4 mg total) under the tongue every 5 (five) minutes x 3 doses as needed for chest pain. 05/18/19   Meccariello, Bernita Raisin, DO  potassium chloride (K-DUR) 10 MEQ tablet Take 1 tablet (10 mEq total) by mouth 2 (two) times daily. Patient taking differently: Take 20 mEq by mouth 2 (two) times daily.  04/04/19   Josue Hector, MD    Social History   Socioeconomic History  . Marital status: Divorced    Spouse name: Not on file  . Number of children: 1  . Years of education: Not on file  . Highest education level: Not on file  Occupational History  . Occupation: Electrical engineer support  Social Needs  . Financial resource strain: Not very hard  . Food insecurity    Worry: Sometimes true    Inability: Sometimes true  . Transportation needs    Medical: No    Non-medical: No  Tobacco Use  . Smoking status: Former Smoker    Packs/day: 2.00    Years: 36.00    Pack years: 72.00    Types: Cigarettes    Quit date: 05/14/2014    Years since quitting: 5.0  . Smokeless tobacco: Never Used  Substance and Sexual Activity  . Alcohol use: Not Currently  . Drug use: Not Currently  . Sexual activity: Not Currently  Lifestyle  . Physical activity    Days per week: 1 day    Minutes per session: 10 min  . Stress: Only a  little  Relationships  . Social Herbalist on phone: Twice a week    Gets together: Twice a week    Attends religious service: 1 to 4 times per year    Active member of club or organization: No    Attends  meetings of clubs or organizations: Never    Relationship status: Divorced  . Intimate partner violence    Fear of current or ex partner: No    Emotionally abused: No    Physically abused: No    Forced sexual activity: No  Other Topics Concern  . Not on file  Social History Narrative  . Not on file     Family History  Problem Relation Age of Onset  . Diabetes Mother   . Hypertension Mother   . Hyperlipidemia Mother   . CAD Mother   . Cervical cancer Mother   . CAD Father   . Heart failure Father   . Testicular cancer Father   . Hypertension Sister   . Hyperlipidemia Sister   . Hypertension Brother   . Hyperlipidemia Brother     ROS: '[x]'  Positive   '[ ]'  Negative   '[ ]'  All sytems reviewed and are negative  Cardiovascular: '[]'  chest pain/pressure '[]'  palpitations '[]'  SOB lying flat '[]'  DOE '[]'  pain in legs while walking '[]'  pain in legs at rest '[]'  pain in legs at night '[]'  non-healing ulcers '[]'  hx of DVT '[]'  swelling in legs  Pulmonary: '[]'  productive cough '[]'  asthma/wheezing '[]'  home O2  Neurologic: '[x]'  weakness in '[x]'  arms '[]'  legs (left hand) '[]'  numbness in '[]'  arms '[]'  legs '[]'  hx of CVA '[]'  mini stroke '[]' difficulty speaking or slurred speech '[]'  temporary loss of vision in one eye '[x]'  dizziness  Hematologic: '[]'  hx of cancer '[]'  bleeding problems '[]'  problems with blood clotting easily  Endocrine:   '[]'  diabetes '[]'  thyroid disease  GI '[]'  vomiting blood '[]'  blood in stool  GU: '[]'  CKD/renal failure '[]'  HD--'[]'  M/W/F or '[]'  T/T/S '[]'  burning with urination '[]'  blood in urine  Psychiatric: '[]'  anxiety '[]'  depression  Musculoskeletal: '[]'  arthritis '[]'  joint pain  Integumentary: '[]'  rashes '[]'  ulcers  Constitutional: '[]'  fever '[]'  chills   Physical  Examination  Vitals:   06/14/19 1005 06/14/19 1020  BP: (!) 119/47 (!) 130/37  Pulse: 68 69  Resp: 16 13  Temp:    SpO2: 97% 96%   Body mass index is 40.74 kg/m.  General:  WDWN in NAD Gait: Not observed HENT: WNL, normocephalic Pulmonary: normal non-labored breathing, without Rales, rhonchi,  wheezing Cardiac: regular, without  Murmurs, rubs or gallops Abdomen: soft, NT/ND, no masses Skin: without rashes Vascular Exam/Pulses: Right radial pulse 2+ palpable. Left radial pulse 1+. No tissue loss left upper extremity. Extremities: without ischemic changes, without Gangrene , without cellulitis; without open wounds;  Musculoskeletal: no muscle wasting or atrophy  Neurologic: A&O X 3; Appropriate Affect ; SENSATION: normal; MOTOR FUNCTION:  moving all extremities equally. Speech is fluent/normal  CBC    Component Value Date/Time   WBC 10.5 06/05/2019 1053   WBC 10.3 05/15/2019 0543   RBC 6.48 (H) 06/05/2019 1053   RBC 5.50 (H) 05/15/2019 0543   HGB 13.2 06/05/2019 1053   HCT 45.6 06/05/2019 1053   PLT 385 06/05/2019 1053   MCV 70 (L) 06/05/2019 1053   MCH 20.4 (L) 06/05/2019 1053   MCH 20.7 (L) 05/15/2019 0543   MCHC 28.9 (L) 06/05/2019 1053   MCHC 32.4 05/15/2019 0543   RDW 18.3 (H) 06/05/2019 1053   LYMPHSABS 2.7 05/14/2019 0309   MONOABS 1.1 (H) 05/14/2019 0309   EOSABS 0.0 05/14/2019 0309   BASOSABS 0.0 05/14/2019 0309    BMET    Component Value Date/Time   NA 136 06/05/2019 1053  K 4.7 06/05/2019 1053   CL 94 (L) 06/05/2019 1053   CO2 23 06/05/2019 1053   GLUCOSE 519 (HH) 06/05/2019 1053   GLUCOSE 310 (H) 05/16/2019 1444   BUN 13 06/05/2019 1053   CREATININE 0.83 06/05/2019 1053   CALCIUM 9.5 06/05/2019 1053   GFRNONAA 80 06/05/2019 1053   GFRAA 92 06/05/2019 1053    COAGS: No results found for: INR, PROTIME   Non-Invasive Vascular Imaging:    Carotid duplex 04/17/2019: No significant ICA or carotid disease.  Left vertebral artery has  retrograde flow.  Arch aortogram: On my review the left carotid is free of disease.  The left vertebral artery is free of disease.  The left subclavian distal to the occluded stent is widely patent.   ASSESSMENT/PLAN: This is a 53 y.o. female that presents with occluded left subclavian artery stent placed 10 years ago at [facility].  She has concern for left subclavian steal syndrome given some posterior circulation symptoms including dizziness and unsteadiness with retrograde flow in the left vertebral artery.  She also has what sounds concerning for left arm claudication particularly when typing.  I think she would benefit from a left carotid subclavian bypass.  That being said, patient is on dual antiplatelet therapy for a drug-eluting stent placed earlier this year for NSTEMI.  In discussing with Dr. Gwenlyn Found, he feels the patient should stay on this for at least 1 year.  I discussed with the patient that I will likely see her in February and we can make plans to proceed with surgery when it safe for her to come off the [place].  March 2021 will be one year.  I gave her my business card and showed her how to contact her office.  I will arrange her follow-up appointment.  Marty Heck, MD [facility] Office: [phone] Pager: [phone]

## 2019-06-14 NOTE — Progress Notes (Signed)
CBG rechecked Rennis Harding, Rn called about results.

## 2019-06-15 ENCOUNTER — Encounter: Payer: Self-pay | Admitting: Family Medicine

## 2019-06-15 DIAGNOSIS — E1151 Type 2 diabetes mellitus with diabetic peripheral angiopathy without gangrene: Secondary | ICD-10-CM | POA: Diagnosis not present

## 2019-06-15 DIAGNOSIS — I771 Stricture of artery: Secondary | ICD-10-CM | POA: Diagnosis not present

## 2019-06-15 LAB — CBC
HCT: 36.3 % (ref 36.0–46.0)
Hemoglobin: 11.5 g/dL — ABNORMAL LOW (ref 12.0–15.0)
MCH: 20.6 pg — ABNORMAL LOW (ref 26.0–34.0)
MCHC: 31.7 g/dL (ref 30.0–36.0)
MCV: 65.2 fL — ABNORMAL LOW (ref 80.0–100.0)
Platelets: 283 10*3/uL (ref 150–400)
RBC: 5.57 MIL/uL — ABNORMAL HIGH (ref 3.87–5.11)
RDW: 16 % — ABNORMAL HIGH (ref 11.5–15.5)
WBC: 11.1 10*3/uL — ABNORMAL HIGH (ref 4.0–10.5)
nRBC: 0 % (ref 0.0–0.2)

## 2019-06-15 LAB — BASIC METABOLIC PANEL
Anion gap: 11 (ref 5–15)
BUN: 12 mg/dL (ref 6–20)
CO2: 23 mmol/L (ref 22–32)
Calcium: 8.7 mg/dL — ABNORMAL LOW (ref 8.9–10.3)
Chloride: 100 mmol/L (ref 98–111)
Creatinine, Ser: 0.76 mg/dL (ref 0.44–1.00)
GFR calc Af Amer: >60 mL/min (ref >60)
GFR calc non Af Amer: >60 mL/min (ref >60)
Glucose, Bld: 356 mg/dL — ABNORMAL HIGH (ref 70–99)
Potassium: 3.4 mmol/L — ABNORMAL LOW (ref 3.5–5.1)
Sodium: 134 mmol/L — ABNORMAL LOW (ref 135–145)

## 2019-06-15 LAB — LIPID PANEL
Cholesterol: 130 mg/dL (ref 0–200)
HDL: 31 mg/dL — ABNORMAL LOW (ref >40)
LDL Cholesterol: 50 mg/dL (ref 0–99)
Total CHOL/HDL Ratio: 4.2 RATIO
Triglycerides: 244 mg/dL — ABNORMAL HIGH (ref <150)
VLDL: 49 mg/dL — ABNORMAL HIGH (ref 0–40)

## 2019-06-15 LAB — GLUCOSE, CAPILLARY
Glucose-Capillary: 317 mg/dL — ABNORMAL HIGH (ref 70–99)
Glucose-Capillary: 376 mg/dL — ABNORMAL HIGH (ref 70–99)
Glucose-Capillary: 350 mg/dL — ABNORMAL HIGH (ref 70–99)

## 2019-06-15 MED ORDER — POTASSIUM CHLORIDE CRYS ER 20 MEQ PO TBCR
20.0000 meq | EXTENDED_RELEASE_TABLET | Freq: Once | ORAL | Status: AC
  Administered 2019-06-15: 20 meq via ORAL
  Filled 2019-06-15: qty 1

## 2019-06-15 NOTE — Discharge Summary (Signed)
Discharge Summary    Patient ID: DANIEL JOHNDROW MRN: [id number]; DOB: 08/17/1965  Admit date: 06/14/2019 Discharge date: 06/15/2019  Primary Care Provider: Shirley, [place], DO  Primary Cardiologist: Jenkins Rouge, MD /Dr. Gwenlyn Found, MD   Discharge Diagnoses    Active Problems:   Hyperlipidemia   Essential hypertension   Hypokalemia   S/P coronary artery stent placement   Type 2 diabetes mellitus with hyperglycemia, with long-term current use of insulin ([place])   Stenosis of left subclavian artery ([place])  Diagnostic Studies/Procedures    PV angiogram/intervention 06/14/2019:  Angiographic Data:   1: Aortic arch angiography-this was a type I arch with an occluded left subclavian artery stent at its origin and throughout its entirety with retrograde left vertebral filling of the left subclavian artery. This was confirmed with selective right innominate angiography. There was a small distance between the distal edge of the stent and the takeoff of the large vertebral and IMA.  IMPRESSION:Ms. Sowder has an occluded left subclavian artery stent placed 10 years ago at [facility]. 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 I think the best option would be left common carotid to subclavian bypass. I reviewed her angiograms with Dr. Fortunato Curling who agrees and will see her prior to her being discharged from the hospital. She is on dual antiplatelet therapy for non-STEMI and LAD diagonal branch intervention by Dr. Burt Knack in March of this year. Optimally, because of the elective nature of this procedure she should remain on DAPT for at least 12 months uninterrupted. A right common femoral angiogram was performed and a MYNX closure device was successfully deployed. The patient left the lab in stable condition.  History of Present Illness     ROISIN MONES is a 53 y.o. female with a hx of CAD s/p non-STEMI  10/27/2018 with LAD and diagonal branch PCI/DES, PAD who was referred to Dr. Gwenlyn Found for peripheral vascular evaluation secondary to left upper extremity claudication with subclavian steal symptoms.  Also with a history of untreated hypertension, DM 2 and hyperlipidemia as well as remote tobacco abuse.  Carotid Dopplers performed 04/17/2019 suggested an occluded left subclavian artery with retrograde vertebral filling.  Subsequent CT angiogram performed 04/27/2019 confirmed this.  Plan was for elective arch angiography with potential percutaneous revascularization.  Hospital Course     Procedure preformed as above with recommendations for possible left common carotid to subclavian bypass surgery given confirmed occluded left subclavian artery at previous stent site performed 10 years ago [facility]. Unfortunately this will need to be postponed in the setting of recent MI with PCI 10/2018. Plan for 10/2019 as dicussed with Dr. Carlis Abbott and Dr. Gwenlyn Found. Pt spoke of concern to Dr. Burt Knack about the possibility of having the procedure by the end of the year given concern for insurance coverage next year. He will reach out to them to discuss. NO medication changes were made during this hospitalization.   Hospital Problems Include:  1.  Peripheral arterial disease: -Referred to Dr.  Gwenlyn Found in the outpatient setting for peripheral vascular evaluation secondary to left upper extremity claudication and subclavian steal symptoms.  Carotid Dopplers performed 04/17/2019 with suggestion of occluded left subclavian artery with retrograde vertebral filling and subsequent CT angiogram performed 04/27/2019 with confirmation -Admitted for elective PV angiogram in which recommendations were for a left common carotid to subclavian bypass.  Case discussed with Dr. Carlis Abbott with [facility]>>> plan is to reevaluate this 10/2019 -Recently underwent  stenting of the LAD and diagonal branches 10/2018 in which she has been on DAPT  therapies with ASA and Plavix.  Continue DAPT until 10/2019 -Right femoral procedure site closed with mynx>> stable -Continue ASA, Brilinta, metoprolol, losartan, statin  2.  CAD S/P non-STEMI with PCI/DES 10/2018: -As above -Continue uninterrupted DAPT with ASA and Brilinta therapies -No anginal symptoms  3.  Hypertension: -Stable, 126/78> 149/75> 143/88 -Continue current regimen  4.  HLD: -Repeat lipid panel -On high intensity statin  5.  DM2: -SSI for glucose control inpatient status -On home Humalog, Lantus therapies -Managed by PCP  6. Hypokalemia: -K+ 3.4>>on home supplementation with 70mq daily   Consultants: None   The patient was seen and examined by Dr. CBurt Knackwho felt that she was stable and ready for discharge on 06/15/2019. Cath site stable with no s/s of hematoma.    Did the patient have an acute coronary syndrome (MI, NSTEMI, STEMI, etc) this admission?:  No                               Did the patient have a percutaneous coronary intervention (stent / angioplasty)?:  No.   _____________  Discharge Vitals Blood pressure 126/78, pulse 79, temperature 98.2 F (36.8 C), temperature source Oral, resp. rate 10, height '5\' 3"'  (1.6 m), weight 105.9 kg, last menstrual period 08/03/2013, SpO2 100 %.  Filed Weights   06/14/19 0536 06/14/19 1040  Weight: 104.3 kg 105.9 kg    Labs & Radiologic Studies    CBC Recent Labs    06/15/19 0251  WBC 11.1*  HGB 11.5*  HCT 36.3  MCV 65.2*  PLT 2625  Basic Metabolic Panel Recent Labs    06/15/19 0251  NA 134*  K 3.4*  CL 100  CO2 23  GLUCOSE 356*  BUN 12  CREATININE 0.76  CALCIUM 8.7*   Fasting Lipid Panel Recent Labs    06/15/19 0251  CHOL 130  HDL 31*  LDLCALC 50  TRIG 244*  CHOLHDL 4.2   Thyroid Function Tests No results for input(s): TSH, T4TOTAL, T3FREE, THYROIDAB in the last 72 hours.  Invalid input(s): FREET3 _____________  No results found. Disposition   Pt is being discharged  home today in good condition.  Follow-up Plans & Appointments   Follow-up Information    BLorretta Harp MD Follow up on 06/26/2019.   Specialties: Cardiology, Radiology [facility]: at [facility]: [phone]         Discharge Instructions    Call MD for:  difficulty breathing, headache or visual disturbances   Complete by: As directed    Call MD for:  extreme fatigue   Complete by: As directed    Call MD for:  hives   Complete by: As directed    Call MD for:  persistant dizziness or light-headedness   Complete by: As directed    Call MD for:  persistant nausea and vomiting   Complete by: As directed    Call MD for:  redness, tenderness, or signs of infection (pain, swelling, redness, odor or green/yellow discharge around incision site)   Complete by: As directed    Call MD for:  severe uncontrolled pain   Complete by: As directed    Call MD for:  temperature >100.4   Complete by: As directed    Diet - low sodium heart healthy  Complete by: As directed    Discharge instructions   Complete by: As directed    No driving for 3 days. No lifting over 5 lbs for 1 week. No sexual activity for 1 week. Keep procedure site clean & dry. If you notice increased pain, swelling, bleeding or pus, call/return!  You may shower, but no soaking baths/hot tubs/pools for 1 week.   PLEASE DO NOT MISS ANY DOSES OF YOUR BRILINTA!!!!! Also keep a log of you blood pressures and bring back to your follow up appt. Please call the office with any questions.   Patients taking blood thinners should generally stay away from medicines like ibuprofen, Advil, Motrin, naproxen, and Aleve due to risk of stomach bleeding. You may take Tylenol as directed or talk to your primary doctor about alternatives.   Increase activity slowly   Complete by: As directed      Discharge Medications   Allergies as of 06/15/2019      Reactions   Ilosone  [erythromycin] Anaphylaxis   Keflex [cephalexin] Anaphylaxis   Penicillins Anaphylaxis   Has patient had a PCN reaction causing immediate rash, facial/tongue/throat swelling, SOB or lightheadedness with hypotension: YES Has patient had a PCN reaction causing severe rash involving mucus membranes or skin necrosis: NO Has patient had a PCN reaction that required hospitalizationNO Has patient had a PCN reaction occurring within the last 10 years: NO If all of the above answers are "NO", then may proceed with Cephalosporin use.   Iron Other (See Comments)   Pt reports condition limiting Iron intake.   Metformin And Related Other (See Comments)   Pt prefers to not take this medication Side effects   Morphine And Related Other (See Comments)   Pt reports hallucinations      Medication List    STOP taking these medications   potassium chloride 10 MEQ tablet Commonly known as: KLOR-CON     TAKE these medications   Accu-Chek FastClix Lancets Misc 1 Package by Does not apply route 3 (three) times daily.   albuterol 108 (90 Base) MCG/ACT inhaler Commonly known as: VENTOLIN HFA Inhale 2 puffs into the lungs 2 (two) times daily as needed for wheezing or shortness of breath.   aspirin 81 MG tablet Take 1 tablet (81 mg total) by mouth daily.   atorvastatin 80 MG tablet Commonly known as: LIPITOR Take 80 mg by mouth at bedtime.   butalbital-acetaminophen-caffeine 50-325-40 MG tablet Commonly known as: FIORICET Take 1-2 tablets by mouth every 6 (six) hours as needed for headache or migraine.   Contour Next One Kit 1 kit by Does not apply route as directed.   Contour Next Test test strip Generic drug: glucose blood Use as instructed   EPINEPHrine 0.3 mg/0.3 mL Soaj injection Commonly known as: EPI-PEN Inject 0.3 mg into the muscle once as needed for anaphylaxis (for an anaphylactic reaction).   FLUoxetine 20 MG tablet Commonly known as: PROZAC Take 40 mg by mouth daily.    fluticasone-salmeterol 115-21 MCG/ACT inhaler Commonly known as: ADVAIR HFA Inhale 2 puffs into the lungs 2 (two) times daily.   furosemide 20 MG tablet Commonly known as: LASIX Take 1 tablet (20 mg total) by mouth 2 (two) times daily.   Glucagon Emergency 1 MG injection Generic drug: glucagon Inject 1 mg into the vein once as needed (FOR ONSET OF HYPOGLYCEMIA).   HYDROcodone-acetaminophen 5-325 MG tablet Commonly known as: Norco Take 1 tablet by mouth every 4 (four) hours as needed for severe pain.  insulin lispro 100 UNIT/ML KwikPen Commonly known as: HumaLOG KwikPen Inject 0.05 mLs (5 Units total) into the skin 3 (three) times daily. Inject 5 units under the skin 3 times daily before meals.   Insulin Pen Needle 31G X 5 MM Misc 1 each by Does not apply route 3 (three) times daily.   lamoTRIgine 200 MG tablet Commonly known as: LAMICTAL Take 200 mg by mouth daily.   Lantus SoloStar 100 UNIT/ML Solostar Pen Generic drug: Insulin Glargine Inject 45 Units into the skin daily.   Latuda 60 MG Tabs Generic drug: Lurasidone HCl Take 60 mg by mouth at bedtime.   levothyroxine 150 MCG tablet Commonly known as: SYNTHROID Take 150 mcg by mouth at bedtime.   losartan 100 MG tablet Commonly known as: COZAAR Take 1 tablet (100 mg total) by mouth daily.   metoprolol tartrate 50 MG tablet Commonly known as: LOPRESSOR Take 1 tablet (50 mg total) by mouth 2 (two) times daily.   multivitamin with minerals Tabs tablet Take 1 tablet by mouth daily.   nitroGLYCERIN 0.4 MG SL tablet Commonly known as: NITROSTAT Place 1 tablet (0.4 mg total) under the tongue every 5 (five) minutes x 3 doses as needed for chest pain.   nystatin ointment Commonly known as: MYCOSTATIN Apply 1 application topically daily as needed (infection). What changed: Another medication with the same name was changed. Make sure you understand how and when to take each.   nystatin powder Commonly known as:  MYCOSTATIN/NYSTOP Apply topically as needed. What changed:   how much to take  when to take this  reasons to take this   pantoprazole 40 MG tablet Commonly known as: PROTONIX Take 1 tablet (40 mg total) by mouth 2 (two) times daily before a meal. X 8 weeks What changed:   when to take this  additional instructions   potassium chloride SA 20 MEQ tablet Commonly known as: KLOR-CON Take 1 tablet (20 mEq total) by mouth 2 (two) times daily.   ticagrelor 90 MG Tabs tablet Commonly known as: BRILINTA Take 1 tablet (90 mg total) by mouth 2 (two) times daily.   traMADol 50 MG tablet Commonly known as: ULTRAM Take 0.5 tablets (25 mg total) by mouth every 12 (twelve) hours as needed for moderate pain. What changed: reasons to take this   Vitamin D3 50 MCG (2000 UT) Tabs Take 2,000-4,000 Units by mouth See admin instructions. Take 2 tablets by mouth once a day on Mon/Wed/Fri and take 1 tablet on Sun/Tues/Thurs/Sat       Outstanding Labs/Studies   None   Duration of Discharge Encounter   Greater than 30 minutes including physician time.  Signed, Kathyrn Drown, NP 06/15/2019, 1:32 PM

## 2019-06-15 NOTE — Assessment & Plan Note (Signed)
Patient needs to be seen in office after her procedure in order to have neuro evaluation.  However over the phone no red flags mentioned by patient.  Given that she is having increase in migraines would like to start prophylactic therapy.  Discussed with pharmacy given that patient is on multiple psych medications as well as other medications for her chronic diseases, Topamax appears to be the best option for this patient.  We will discuss with her over my chart about starting this medication.

## 2019-06-15 NOTE — Assessment & Plan Note (Signed)
CBGs remained severely elevated.  Instructed patient to follow-up with her endocrinologist as soon as she is able.  In the meantime she is to increase her Humalog to 10 mg 3 times daily with meals.  She will continue taking the 45 units of Lantus every night rather than sometimes decreasing to 40 units.  Patient again counseled on diet habits.

## 2019-06-15 NOTE — Progress Notes (Addendum)
Progress Note  Patient Name: Morgan [name] Date of Encounter: 06/15/2019  Primary Cardiologist: Dr. Quay Burow, MD  Subjective   Doing well today. Procedure site stable. Denies pain   Inpatient Medications    Scheduled Meds: . albuterol  3 mL Inhalation BID  . aspirin EC  81 mg Oral Daily  . atorvastatin  80 mg Oral QHS  . FLUoxetine  20 mg Oral Daily  . furosemide  20 mg Oral BID  . insulin aspart  5 Units Subcutaneous TID WC  . insulin glargine  48 Units Subcutaneous Daily  . lamoTRIgine  200 mg Oral Daily  . levothyroxine  150 mcg Oral QHS  . losartan  100 mg Oral Daily  . metoprolol tartrate  50 mg Oral BID  . pantoprazole  40 mg Oral BID AC  . potassium chloride  10 mEq Oral BID  . sodium chloride flush  3 mL Intravenous Q12H  . sodium chloride flush  3 mL Intravenous Q12H  . ticagrelor  90 mg Oral BID   Continuous Infusions: . sodium chloride     PRN Meds: sodium chloride, acetaminophen, hydrALAZINE, labetalol, sodium chloride flush, traMADol   Vital Signs    Vitals:   06/14/19 2300 06/15/19 0532 06/15/19 0600 06/15/19 0840  BP:  (!) 149/75    Pulse: 72 75 71   Resp: 19 14 19    Temp:  98.5 F (36.9 C)    TempSrc:  Oral    SpO2: 97% 97% 96% 99%  Weight:      Height:        Intake/Output Summary (Last 24 hours) at 06/15/2019 1011 Last data filed at 06/15/2019 0545 Gross per 24 hour  Intake 1080 ml  Output -  Net 1080 ml   Filed Weights   06/14/19 0536 06/14/19 1040  Weight: 104.3 kg 105.9 kg    Physical Exam   General: Well developed, well nourished, NAD Neck: Positive right carotid bruit. No JVD Lungs:Clear to ausculation bilaterally. No wheeze Cardiovascular: RRR with S1 S2. No murmur Abdomen: Soft, non-tender, non-distended. No obvious abdominal masses. Extremities: No edema. No clubbing or cyanosis. DP pulses 1+ bilaterally Neuro: Alert and oriented. No focal deficits. No facial asymmetry. MAE spontaneously. Psych:  Responds to questions appropriately with normal affect.    Labs    Chemistry Recent Labs  Lab 06/15/19 0251  NA 134*  K 3.4*  CL 100  CO2 23  GLUCOSE 356*  BUN 12  CREATININE 0.76  CALCIUM 8.7*  GFRNONAA >60  GFRAA >60  ANIONGAP 11     Hematology Recent Labs  Lab 06/15/19 0251  WBC 11.1*  RBC 5.57*  HGB 11.5*  HCT 36.3  MCV 65.2*  MCH 20.6*  MCHC 31.7  RDW 16.0*  PLT 283    Cardiac EnzymesNo results for input(s): TROPONINI in the last 168 hours. No results for input(s): TROPIPOC in the last 168 hours.   BNPNo results for input(s): BNP, PROBNP in the last 168 hours.   DDimer No results for input(s): DDIMER in the last 168 hours.   Radiology    No results found.  Telemetry    06/15/2019 NSR with HR 70s- Personally Reviewed  ECG    No new tracing as of 06/15/2019- Personally Reviewed  Cardiac Studies   PV angiogram/intervention 06/14/2019:  Angiographic Data:   1: Aortic arch angiography-this was a type I arch with an occluded left subclavian artery stent at its origin and throughout its entirety with  retrograde left vertebral filling of the left subclavian artery.  This was confirmed with selective right innominate angiography.  There was a small distance between the distal edge of the stent and the takeoff of the large vertebral and IMA.  IMPRESSION: Ms. Boutilier has an occluded left subclavian artery stent placed 10 years ago at [facility].  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  I think the best option would be left common carotid to subclavian bypass.  I reviewed her angiograms with Dr. Fortunato Curling who agrees and will see her prior to her being discharged from the hospital.  She is on dual antiplatelet therapy for non-STEMI and LAD diagonal branch intervention by Dr. Burt Knack in March of this year.  Optimally, because of the elective nature of this procedure she should remain  on DAPT for at least 12 months uninterrupted.  A right common femoral angiogram was performed and a MYNX closure device was successfully deployed.  The patient left the lab in stable condition.  Patient Profile     53 y.o. female with a hx of CAD s/p non-STEMI 10/27/2018 with LAD and diagonal branch PCI/DES, PAD who was referred to Dr. Gwenlyn Found for peripheral vascular evaluation secondary to left upper extremity claudication with subclavian steal symptoms.  Also with a history of untreated hypertension, DM 2 and hyperlipidemia as well as remote tobacco abuse.  Carotid Dopplers performed 04/17/2019 suggested an occluded left subclavian artery with retrograde vertebral filling.  Subsequent CT angiogram performed 04/27/2019 confirmed this.  Plan was for elective arch angiography with potential percutaneous revascularization.  Assessment & Plan    1.  Peripheral arterial disease: -Referred to Dr.  Gwenlyn Found in the outpatient setting for peripheral vascular evaluation secondary to left upper extremity claudication and subclavian steal symptoms.  Carotid Dopplers performed 04/17/2019 with suggestion of occluded left subclavian artery with retrograde vertebral filling and subsequent CT angiogram performed 04/27/2019 with confirmation -Admitted for elective PV angiogram in which recommendations were for a left common carotid to subclavian bypass.  Case discussed with Dr. Carlis Abbott with [facility]>>> plan is to reevaluate this 10/2019 -Recently underwent stenting of the LAD and diagonal branches 10/2018 in which she has been on DAPT therapies with ASA and Plavix.  We will continue this until 10/2019>> prior to surgery.  -Right femoral procedure site closed with mynx>> stable -Continue ASA, Brilinta, metoprolol, losartan, statin  2.  CAD S/P non-STEMI with PCI/DES 10/2018: -As above -Continue uninterrupted DAPT with ASA and Brilinta therapies -No anginal symptoms   3.  Hypertension: -Stable, 126/78> 149/75> 143/88 -Continue  current regimen  4.  HLD: -Repeat lipid panel -On high intensity statin  5.  DM2: -SSI for glucose control inpatient status -On home Humalog, Lantus therapies -Managed by PCP   Signed, Kathyrn Drown NP-C [facility] Pager: [phone] 06/15/2019, 10:11 AM     For questions or updates, please contact   Please consult [email or web address] for contact info under Cardiology/STEMI.  Patient seen, examined. Available data reviewed. Agree with findings, assessment, and plan as outlined by Kathyrn Drown, PA-C. Right groin site is clear with no ecchymoses or hematoma. Medically stable for DC today. Plans noted for vascular surgery in March when she is out to one year from her MI. She would really like to be done this calendar year if possible - notes insurance and also work limitation with her left hand as reasons she would like to proceed sooner. I will communicate with Dr Gwenlyn Found and Dr  Clark to get their thoughts on timing of surgery.  Sherren Mocha, M.D. 06/15/2019 12:33 PM

## 2019-06-15 NOTE — Progress Notes (Signed)
Pt discharged today via car service to home.  Pt's IVs removed and pt taken off telemetry, CCMD notified.  Pt left with all of their personal belongings.  AVS documentation reviewed with pt and all questions were answered.

## 2019-06-18 MED ORDER — TOPIRAMATE 25 MG PO TABS: 25.0000 mg | ORAL_TABLET | Freq: Every day | ORAL | 1 refills | Status: DC

## 2019-06-20 ENCOUNTER — Encounter (HOSPITAL_COMMUNITY): Payer: 59

## 2019-06-20 ENCOUNTER — Encounter: Payer: Self-pay | Admitting: Family Medicine

## 2019-06-20 MED ORDER — TRAMADOL HCL 50 MG PO TABS: 25.0000 mg | ORAL_TABLET | Freq: Two times a day (BID) | ORAL | 0 refills | Status: AC | PRN

## 2019-06-21 ENCOUNTER — Telehealth: Payer: Self-pay | Admitting: Cardiovascular Disease

## 2019-06-21 ENCOUNTER — Encounter: Payer: Self-pay | Admitting: Internal Medicine

## 2019-06-21 NOTE — Telephone Encounter (Signed)
Called the patient and got her on the virtual schedule with Almyra Deforest, PA-C on 06/26/19 at 2:30PM. Patient thanked me for calling her back so soon and being able to get her a virtual visit.

## 2019-06-21 NOTE — Telephone Encounter (Signed)
New Message  Patient states that she can not financially afford to come into office on 06/26/19 at 3:00 pm and would like to know if the appointment can be done virtually instead. Please give patient a call back to confirm.

## 2019-06-22 ENCOUNTER — Encounter: Payer: Self-pay | Admitting: Family Medicine

## 2019-06-26 ENCOUNTER — Telehealth (INDEPENDENT_AMBULATORY_CARE_PROVIDER_SITE_OTHER): Payer: 59 | Admitting: Physician Assistant

## 2019-06-26 ENCOUNTER — Encounter: Payer: Self-pay | Admitting: Physician Assistant

## 2019-06-26 ENCOUNTER — Ambulatory Visit: Payer: 59 | Admitting: Cardiovascular Disease

## 2019-06-26 VITALS — BP 144/71 | HR 70 | Ht 63.0 in | Wt 220.0 lb

## 2019-06-26 DIAGNOSIS — I10 Essential (primary) hypertension: Secondary | ICD-10-CM

## 2019-06-26 DIAGNOSIS — E785 Hyperlipidemia, unspecified: Secondary | ICD-10-CM

## 2019-06-26 DIAGNOSIS — I2510 Atherosclerotic heart disease of native coronary artery without angina pectoris: Secondary | ICD-10-CM | POA: Diagnosis not present

## 2019-06-26 DIAGNOSIS — E119 Type 2 diabetes mellitus without complications: Secondary | ICD-10-CM

## 2019-06-26 DIAGNOSIS — Z794 Long term (current) use of insulin: Secondary | ICD-10-CM

## 2019-06-26 DIAGNOSIS — E876 Hypokalemia: Secondary | ICD-10-CM

## 2019-06-26 DIAGNOSIS — I771 Stricture of artery: Secondary | ICD-10-CM

## 2019-06-26 DIAGNOSIS — Z8673 Personal history of transient ischemic attack (TIA), and cerebral infarction without residual deficits: Secondary | ICD-10-CM

## 2019-06-26 NOTE — Progress Notes (Signed)
Virtual Visit via Video Note   This visit type was conducted due to national recommendations for restrictions regarding the COVID-19 Pandemic (e.g. social distancing) in an effort to limit this patient's exposure and mitigate transmission in our community.  Due to her co-morbid illnesses, this patient is at least at moderate risk for complications without adequate follow up.  This format is felt to be most appropriate for this patient at this time.  All issues noted in this document were discussed and addressed.  A limited physical exam was performed with this format.  Please refer to the patient's chart for her consent to telehealth for [facility].   Date:  06/26/2019   ID:  Morgan [name], DOB 1965-10-10, MRN [id number]  Patient Location: Home Provider Location: Home  PCP:  Shirley, [place], DO  Cardiologist:  Jenkins Rouge, MD  Electrophysiologist:  None   Evaluation Performed:  Follow-Up Visit  Chief Complaint:  followup  History of Present Illness:    Morgan [name] is a 53 y.o. female with past medical history of CAD, bipolar depression, chronic bronchitis, hypertension, hyperlipidemia, hypothyroidism, left subclavian stenosis, history of CVA and DM2.  Patient was admitted in March 2020 with intractable nausea, vomiting and DKA.  Troponin was elevated, she underwent cardiac catheterization by Dr. Burt Knack which showed severe LAD and D1 stenosis treated with DES.  Echocardiogram obtained on 10/26/2018 showed EF 45 to 50% with regional wall motion abnormality in the septal and apex consistent with her CAD.  She was discharged on Lasix, losartan, Lopressor, aspirin and Brilinta.  During her visit with Dr. Velta Addison on 04/03/2019, she was noted to have a bruit.  Carotid Doppler obtained on 04/17/2019 showed mild bilateral ICA disease, monophasic waveform in the left subclavian artery suggesting proximal stenosis with subclavian steal syndrome.  Subsequent CT angiogram obtained on 9/25  confirmed occluded left subclavian artery stent that was placed a decade ago at [facility].  She was evaluated by Dr. Alvester Chou and eventually underwent peripheral vascular catheterization 06/14/2019 with aortic arch angiography.  The procedure demonstrated type I arch with occluded left subclavian artery stent at its origin and throughout its entirety with retrograde left vertebral filling of the left subclavian artery.  Dr. Gwenlyn Found recommended left common carotid to subclavian bypass.  The patient was referred to Dr. Fortunato Curling of vascular surgery.  Since her previous stent was placed in March, it is recommended she remain on DAPT for at least 12 months uninterrupted.  Patient presents today for cardiology follow-up via doximity video conference visit.  She denies any chest pain.  She says her left arm only has enough strength to pick up a paper cup however she denies any bluish discoloration, pale discoloration or significant pain in the left arm.  She does occasionally notice some tingling sensation in her left fingertip however there is no loss of sensation.  She is aware that she need to seek urgent medical attention if she does experience above symptoms.  We initially discussed that she need to wait until next March before considering vascular surgery at which time she can begin to hold her DAPT.  However one of her concerns is her workplace is changing her insurance from PPO to HMO starting in January next year.  She wished to know what is the earliest possible time for her to hold DAPT prior to vascular surgery or does she must wait until next March.  She is willing to wait if that is the safest route however if  there is any way possible to have the surgery done before January, she would prefer that.  The patient does not have symptoms concerning for COVID-19 infection (fever, chills, cough, or new shortness of breath).    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   Stenosis of left subclavian artery ([place])    Stroke ([place])    "I've had 2; most recent one was ~ 2015 or before; affected balance" (06/29/2018)   Thalassemia minor    Type II diabetes mellitus ([place])    Vitamin D deficiency    Past Surgical History:  Procedure Laterality Date   ABDOMINAL HYSTERECTOMY  2015   "still have 1 ovary"   AORTIC ARCH ANGIOGRAPHY  06/14/2019   AORTIC ARCH ANGIOGRAPHY N/A 06/14/2019   Procedure: AORTIC ARCH ANGIOGRAPHY;  Surgeon: Lorretta Harp, MD;  Location: [place] CV LAB;  Service: Cardiovascular;  Laterality: N/A;   BIOPSY  07/25/2018   Procedure: BIOPSY;  Surgeon: Thornton Park, MD;  Location: [facility] ENDOSCOPY;  Service: Gastroenterology;;   [place]; [place] N/A 10/27/2018   Procedure: CORONARY STENT INTERVENTION;  Surgeon: Sherren Mocha, MD;  Location: [place] CV LAB;  Service: Cardiovascular;  Laterality: N/A;   ESOPHAGOGASTRODUODENOSCOPY (EGD) WITH PROPOFOL N/A 07/25/2018   Procedure: ESOPHAGOGASTRODUODENOSCOPY (EGD) WITH PROPOFOL;  Surgeon: Thornton Park, MD;  Location: [place];  Service: Gastroenterology;  Laterality: N/A;   KNEE RECONSTRUCTION Left 1980   LAPAROSCOPIC CHOLECYSTECTOMY     LEFT HEART CATH AND CORONARY ANGIOGRAPHY N/A 10/27/2018   Procedure: LEFT HEART CATH AND CORONARY ANGIOGRAPHY;  Surgeon: Sherren Mocha, MD;  Location: [place] CV  LAB;  Service: Cardiovascular;  Laterality: N/A;   SUBCLAVIAN STENT PLACEMENT Left 2010   TONSILLECTOMY  1970   UPPER EXTREMITY ANGIOGRAPHY Right 06/14/2019   Procedure: UPPER EXTREMITY ANGIOGRAPHY;  Surgeon: Lorretta Harp, MD;  Location: [place] CV LAB;  Service: Cardiovascular;  Laterality: Right;  upper ext     No outpatient medications have been marked as taking for the 06/26/19 encounter (Appointment) with Almyra Deforest, [place].     Allergies:   Ilosone [erythromycin], Keflex [cephalexin], Penicillins, Iron, Metformin and related, and Morphine and related   Social History   Tobacco Use   Smoking status: Former Smoker    Packs/day: 2.00    Years: 36.00    Pack years: 72.00    Types: Cigarettes    Quit date: 05/14/2014    Years since quitting: 5.1   Smokeless tobacco: Never Used  Substance Use Topics   Alcohol use: Not Currently   Drug use: Not Currently     Family Hx: The patient's family history includes CAD in her father and mother; Cervical cancer in her mother; Diabetes in her mother; Heart failure in her father; Hyperlipidemia in her brother, mother, and sister; Hypertension in her  brother, mother, and sister; Testicular cancer in her father.  ROS:   Please see the history of present illness.     All other systems reviewed and are negative.   Prior CV studies:   The following studies were reviewed today:  Peripheral vascular angiography 06/14/2019 Angiographic Data:   1: Aortic arch angiography-this was a type I arch with an occluded left subclavian artery stent at its origin and throughout its entirety with retrograde left vertebral filling of the left subclavian artery.  This was confirmed with selective right innominate angiography.  There was a small distance between the distal edge of the stent and the takeoff of the large vertebral and IMA.  IMPRESSION: Ms. Baro has an occluded left subclavian artery stent placed 10 years ago at [facility].  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  I think the best option would be left common carotid to subclavian bypass.  I reviewed her angiograms with Dr. Fortunato Curling who agrees and will see her prior to her being discharged from the hospital.  She is on dual antiplatelet therapy for non-STEMI and LAD diagonal branch intervention by Dr. Burt Knack in March of this year.  Optimally, because of the elective nature of this procedure she should remain on DAPT for at least 12 months uninterrupted.  A right common femoral angiogram was performed and a MYNX closure device was successfully deployed.  The patient left the lab in stable condition.  Labs/Other Tests and Data Reviewed:    EKG:  An ECG dated 05/14/2019 was personally reviewed today and demonstrated:  Normal sinus rhythm without significant ST-T wave changes.  Recent Labs: 07/26/2018: TSH 2.978 05/12/2019: ALT 25 05/16/2019: Magnesium 1.6 06/15/2019: BUN 12; Creatinine, Ser 0.76; Hemoglobin 11.5; Platelets 283; Potassium 3.4; Sodium 134   Recent Lipid Panel Lab Results  Component Value Date/Time   CHOL 130 06/15/2019 02:51 AM   TRIG 244 (H) 06/15/2019 02:51 AM   HDL 31 (L) 06/15/2019 02:51 AM   CHOLHDL 4.2 06/15/2019 02:51 AM   LDLCALC 50 06/15/2019 02:51 AM   LDLDIRECT 144.0 09/25/2018 03:05 PM    Wt Readings from Last 3 Encounters:  06/14/19 233 lb 7.5 oz (105.9 kg)  06/05/19 223 lb (101.2 kg)  05/18/19 225 lb 9.6 oz (102.3 kg)     Objective:    Vital Signs:  LMP 08/03/2013    VITAL SIGNS:  reviewed  ASSESSMENT & PLAN:    1. CAD: Continue aspirin and Brilinta.  Last PCI was in March 2020.  She denies any recent chest pain.  2. Left subclavian stenosis: She is symptomatic with weakness in the left upper extremity.  Recently underwent peripheral vascular angiography which demonstrated a occluded left subclavian stent that was placed more than 10 years  ago.  She has been referred to vascular surgery.  Because her last ABI was in March 2020, the earliest time she may begin to hold DAPT will be in March of next year.  Unfortunately, her workplace might change her PPO to [facility] insurance next year and that she is asking what would be the earliest time before she can start holding ABI.  She is willing to wait until March if it is the safest option.  I will forward this to her primary cardiologist to make this decision.  She has not been seen by vascular surgery yet and is scheduled to see them early next year based on the referral.  3. Hypertension: Continue  current blood pressure medication.  She was unable to provide me with any vital signs today despite best effort.  4. Hyperlipidemia: Continue Lipitor  5. Hypothyroidism: Managed by primary care provider  6. DM2: On insulin, managed by primary care provider  7. History of CVA: No recent recurrence   COVID-19 Education: The signs and symptoms of COVID-19 were discussed with the patient and how to seek care for testing (follow up with PCP or arrange E-visit).  The importance of social distancing was discussed today.  Time:   Today, I have spent 10 minutes with the patient with telehealth technology discussing the above problems.     Medication Adjustments/Labs and Tests Ordered: Current medicines are reviewed at length with the patient today.  Concerns regarding medicines are outlined above.   Tests Ordered: No orders of the defined types were placed in this encounter.   Medication Changes: No orders of the defined types were placed in this encounter.   Follow Up:  In Person in 3 month(s)  Signed, Almyra Deforest, [place]  06/26/2019 2:31 PM    [facility]

## 2019-06-27 MED ORDER — POTASSIUM CHLORIDE CRYS ER 20 MEQ PO TBCR: 20.0000 meq | EXTENDED_RELEASE_TABLET | Freq: Two times a day (BID) | ORAL | 0 refills | Status: DC

## 2019-06-27 NOTE — Patient Instructions (Signed)
Medication Instructions:   Your physician recommends that you continue on your current medications as directed. Please refer to the Current Medication list given to you today.  *If you need a refill on your cardiac medications before your next appointment, please call your pharmacy*  Lab Work:  NONE ordered at this time of appointment   If you have labs (blood work) drawn today and your tests are completely normal, you will receive your results only by: [name] MyChart Message (if you have MyChart) OR . A paper copy in the mail If you have any lab test that is abnormal or we need to change your treatment, we will call you to review the results.  Testing/Procedures:  NONE ordered at this time of appointment   Follow-Up: At [facility], you and your health needs are our priority.  As part of our continuing mission to provide you with exceptional heart care, we have created designated Provider Care Teams.  These Care Teams include your primary Cardiologist (physician) and Advanced Practice Providers (APPs -  Physician Assistants and Nurse Practitioners) who all work together to provide you with the care you need, when you need it.  Your next appointment:   3 month(s)  The format for your next appointment:   In Person  Provider:   Jenkins Rouge, MD  Other Instructions

## 2019-07-04 NOTE — Progress Notes (Deleted)
Virtual Visit via Video Note   This visit type was conducted due to national recommendations for restrictions regarding the COVID-19 Pandemic (e.g. social distancing) in an effort to limit this patient's exposure and mitigate transmission in our community.  Due to her co-morbid illnesses, this patient is at least at moderate risk for complications without adequate follow up.  This format is felt to be most appropriate for this patient at this time.  All issues noted in this document were discussed and addressed.  A limited physical exam was performed with this format.  Please refer to the patient's chart for her consent to telehealth for [facility].   Date:  07/04/2019   ID:  Morgan [name], DOB 14-Jan-1966, MRN [id number]  Patient Location: Home Provider Location: Home  PCP:  Shirley, [place], DO  Cardiologist:  Jenkins Rouge, MD  Electrophysiologist:  None   Evaluation Performed:  Follow-Up Visit  Chief Complaint:  followup  History of Present Illness:    Morgan [name] is a 53 y.o. female with past medical history of CAD, bipolar depression, chronic bronchitis, hypertension, hyperlipidemia, hypothyroidism, left subclavian stenosis, history of CVA and DM2.  Patient was admitted in March 2020 with intractable nausea, vomiting and DKA.  Troponin was elevated, she underwent cardiac catheterization by Dr. Burt Knack which showed severe LAD and D1 stenosis treated with DES.  Echocardiogram obtained on 10/26/2018 showed EF 45 to 50% with regional wall motion abnormality in the septal and apex consistent with her CAD.  She was discharged on Lasix, losartan, Lopressor, aspirin and Brilinta.   She has had LUE weakness Had left subclavian stent placed at [facility] 10 years ago 06/22/19 Dr Gwenlyn Found did arch angiography and documented occlusion of stent. He felt she may benefit from vascular surgery involving left common carotid to subclavian bypass. She is supposed to see Dr Donzetta Matters with [facility]. Ideally would not  have surgery until a year of DAT from her most recent stent which would be March 2021   ***  The patient does not have symptoms concerning for COVID-19 infection (fever, chills, cough, or new shortness of breath).    Past Medical History:  Diagnosis Date  . Anxiety   . Asthma   . Bipolar depression ([place])   . Chronic bronchitis ([place])   . Chronic esophagogastric ulcer   . Chronic stomach ulcer   . Fibroid   . GERD (gastroesophageal reflux disease)   . Heart murmur   . Hepatic steatosis   . History of blood transfusion 2010   "related to subclavian stent"  . History of hiatal hernia   . Hyperlipidemia   . Hypertension   . Hypothyroid   . Migraine    "cerebral migraines; 1-2/month" (06/29/2018)  . Non-ST elevation (NSTEMI) myocardial infarction ([place]) 10/27/2018  . 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 Stroke [facility])    "I've had 2; most recent one was ~ 2015 or before; affected balance" (06/29/2018)  . Thalassemia minor   . Type II diabetes mellitus ([place])   . Vitamin D deficiency    Past Surgical History:  Procedure Laterality Date  . ABDOMINAL HYSTERECTOMY  2015   "still have 1 ovary"  . AORTIC ARCH ANGIOGRAPHY  06/14/2019  . AORTIC ARCH ANGIOGRAPHY N/A 06/14/2019   Procedure: AORTIC ARCH ANGIOGRAPHY;  Surgeon: Lorretta Harp, MD;  Location: [place] CV LAB;  Service: Cardiovascular;  Laterality: N/A;  . BIOPSY  07/25/2018   Procedure:  BIOPSY;  Surgeon: Thornton Park, MD;  Location: [facility] ENDOSCOPY;  Service: Gastroenterology;;  . [place]; 1996  . CORONARY STENT INTERVENTION N/A 10/27/2018   Procedure: CORONARY STENT INTERVENTION;  Surgeon: Sherren Mocha, MD;  Location: [place] CV LAB;  Service: Cardiovascular;  Laterality: N/A;  . ESOPHAGOGASTRODUODENOSCOPY (EGD) WITH PROPOFOL N/A 07/25/2018   Procedure: ESOPHAGOGASTRODUODENOSCOPY (EGD) WITH PROPOFOL;  Surgeon: Thornton Park, MD;  Location: [place];  Service: Gastroenterology;  Laterality: N/A;  . KNEE RECONSTRUCTION Left 1980  . LAPAROSCOPIC CHOLECYSTECTOMY    . LEFT HEART CATH AND CORONARY ANGIOGRAPHY N/A 10/27/2018   Procedure: LEFT HEART CATH AND CORONARY ANGIOGRAPHY;  Surgeon: Sherren Mocha, MD;  Location: [place] CV LAB;  Service: Cardiovascular;  Laterality: N/A;  . SUBCLAVIAN STENT PLACEMENT Left 2010  . TONSILLECTOMY  1970  . UPPER EXTREMITY ANGIOGRAPHY Right 06/14/2019   Procedure: UPPER EXTREMITY ANGIOGRAPHY;  Surgeon: Lorretta Harp, MD;  Location: [place] CV LAB;  Service: Cardiovascular;  Laterality: Right;  upper ext     No outpatient medications have been marked as taking for the 07/13/19 encounter (Appointment) with Josue Hector, MD.     Allergies:   Ilosone [erythromycin], Keflex [cephalexin], Penicillins, Iron, Metformin and related, and Morphine and related   Social History   Tobacco Use  . Smoking status: Former Smoker    Packs/day: 2.00    Years: 36.00    Pack years: 72.00    Types: Cigarettes    Quit date: 05/14/2014    Years since quitting: 5.1  . Smokeless tobacco: Never Used  Substance Use Topics  . Alcohol use: Not Currently  . Drug use: Not Currently     Family Hx: The patient's family history includes CAD in her father and mother; Cervical cancer in her mother; Diabetes in her mother; Heart failure in her father; Hyperlipidemia in her brother, mother, and sister; Hypertension in her brother, mother, and sister; Testicular cancer in her father.  ROS:   Please see the history of present illness.     All other systems reviewed and are negative.   Prior CV studies:   The following studies were reviewed today:  Peripheral vascular angiography 06/14/2019 Angiographic Data:   1: Aortic arch angiography-this was a type I arch with an occluded left subclavian artery stent at its origin and throughout its entirety with retrograde left vertebral filling of the left  subclavian artery.  This was confirmed with selective right innominate angiography.  There was a small distance between the distal edge of the stent and the takeoff of the large vertebral and IMA.  IMPRESSION: Ms. Takala has an occluded left subclavian artery stent placed 10 years ago at [facility].  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  I think the best option would be left common carotid to subclavian bypass.  I reviewed her angiograms with Dr. Fortunato Curling who agrees and will see her prior to her being discharged from the hospital.  She is on dual antiplatelet therapy for non-STEMI and LAD diagonal branch intervention by Dr. Burt Knack in March of this year.  Optimally, because of the elective nature of this procedure she should remain on DAPT for at least 12 months uninterrupted.  A right common femoral angiogram was performed and a MYNX closure device was successfully deployed.  The patient left the lab in stable condition.  Labs/Other Tests and Data Reviewed:    EKG:  An ECG dated 05/14/2019  was personally reviewed today and demonstrated:  Normal sinus rhythm without significant ST-T wave changes.  Recent Labs: 07/26/2018: TSH 2.978 05/12/2019: ALT 25 05/16/2019: Magnesium 1.6 06/15/2019: BUN 12; Creatinine, Ser 0.76; Hemoglobin 11.5; Platelets 283; Potassium 3.4; Sodium 134   Recent Lipid Panel Lab Results  Component Value Date/Time   CHOL 130 06/15/2019 02:51 AM   TRIG 244 (H) 06/15/2019 02:51 AM   HDL 31 (L) 06/15/2019 02:51 AM   CHOLHDL 4.2 06/15/2019 02:51 AM   LDLCALC 50 06/15/2019 02:51 AM   LDLDIRECT 144.0 09/25/2018 03:05 PM    Wt Readings from Last 3 Encounters:  06/26/19 220 lb (99.8 kg)  06/14/19 233 lb 7.5 oz (105.9 kg)  06/05/19 223 lb (101.2 kg)     Objective:    LMP 08/03/2013   Affect appropriate Healthy:  appears stated age 53: normal Neck supple with no adenopathy JVP  normal no bruits no thyromegaly Lungs clear with no wheezing and good diaphragmatic motion Heart:  S1/S2 no murmur, no rub, gallop or click PMI normal Abdomen: benighn, BS positve, no tenderness, no AAA no bruit.  No HSM or HJR Distal pulses intact with no bruits No edema Neuro non-focal Skin warm and dry No muscular weakness Decreased pulses in LUE ***  ASSESSMENT & PLAN:    1. CAD: Continue aspirin and Brilinta.  Last PCI was in March 2020.  She denies any recent chest pain.  2. Left subclavian stenosis: She is symptomatic with weakness in the left upper extremity.  Recently underwent peripheral vascular angiography which demonstrated a occluded left subclavian stent that was placed more than 10 years ago.  She has been referred to vascular surgery.  She may benefit from left common carotid to subclavian bypass Her angiogram done 06/14/19 was shown to Dr Fortunato Curling by Dr Gwenlyn Found during hospitalization  Caveat regarding need for DAT ideally till March 2021 for her recent stent ***   3. Hypertension: BP's to be taken in right arm stable continue current meds   4. Hyperlipidemia: Continue Lipitor LDL 50 on labs done 06/15/19   5. Hypothyroidism: Managed by primary care provider  6. DM2: On insulin, managed by primary care provider     COVID-19 Education: The signs and symptoms of COVID-19 were discussed with the patient and how to seek care for testing (follow up with PCP or arrange E-visit).  The importance of social distancing was discussed today..     Medication Adjustments/Labs and Tests Ordered: Current medicines are reviewed at length with the patient today.  Concerns regarding medicines are outlined above.   Tests Ordered: No orders of the defined types were placed in this encounter.   Medication Changes: No orders of the defined types were placed in this encounter.   Follow Up:  [facility] regarding left subclavian occlusion cardiology in 6 months   Signed, Jenkins Rouge, MD  07/04/2019 7:11 PM    [place]

## 2019-07-11 ENCOUNTER — Other Ambulatory Visit: Payer: Self-pay

## 2019-07-11 ENCOUNTER — Telehealth (INDEPENDENT_AMBULATORY_CARE_PROVIDER_SITE_OTHER): Payer: 59 | Admitting: Family Medicine

## 2019-07-11 DIAGNOSIS — Z794 Long term (current) use of insulin: Secondary | ICD-10-CM

## 2019-07-11 DIAGNOSIS — G43709 Chronic migraine without aura, not intractable, without status migrainosus: Secondary | ICD-10-CM | POA: Diagnosis not present

## 2019-07-11 DIAGNOSIS — E1165 Type 2 diabetes mellitus with hyperglycemia: Secondary | ICD-10-CM | POA: Diagnosis not present

## 2019-07-11 NOTE — Progress Notes (Signed)
[place] Telemedicine Visit  Patient consented to have virtual visit. Method of visit: Video  Encounter participants: Patient: Morgan [name] - located at home Provider: [place] Kayler Buckholtz - located at [facility]  Others (if applicable): n/a  Chief Complaint: migraine follow up  HPI:  Patient following up on recent migraines. Has started the topiramate 25mg  and has noticed less frequent migraines. Migraine free today, but yesterday had migriane. Notes that the intensity of the headaches has not changed and she has oto lay down. She reports less frequency though with the number of migraines down to about 3-4 times per week. She denies any focal symptoms. No weakness or dizziness.   Diabetes:  Her diabetes has been poorly controlled. She has an endocrinologist that's he has not yet been able to follow up with. At our last appointment, she was taking Lantus 30 in am and 30 at pm fasting am CBG >600, 535, in 350's. Lowest CBG 353. She has had no low blood sugars and no symptoms of hypoglycemia.   ROS: per HPI  Pertinent PMHx: T2DM, migrianes  Exam:  BP 138/68, HR: 75 General: well-appearing Respiratory: able to speak in complete sentences without issue  Assessment/Plan:   Type 2 diabetes mellitus with hyperglycemia, with long-term current use of insulin (HCC) Increase Lantus 35 to in am and 30 at night. She was encouraged to schedule follow up with her endocrinologist. Will likely need to continue to titrate lantus up.  Migraines Patient improved with less frequent migraines after topiramate 25mg . No reporeted focal neurological findings.  -Will increase to 50mg  with goal of lessening frequency more.  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 Will complete this.  - follow up for continued titration of topiramate.     Time spent during visit with patient: 12 minutes  [place] Tanazia Achee,  DO PGY-3, [place]

## 2019-07-12 ENCOUNTER — Telehealth: Payer: Self-pay | Admitting: Cardiovascular Disease

## 2019-07-13 ENCOUNTER — Encounter: Payer: Self-pay | Admitting: Family Medicine

## 2019-07-13 ENCOUNTER — Ambulatory Visit: Payer: 59 | Admitting: Cardiovascular Disease

## 2019-07-14 ENCOUNTER — Encounter: Payer: Self-pay | Admitting: Family Medicine

## 2019-07-16 DIAGNOSIS — G43909 Migraine, unspecified, not intractable, without status migrainosus: Secondary | ICD-10-CM | POA: Insufficient documentation

## 2019-07-16 MED ORDER — TOPIRAMATE 50 MG PO TABS: 50.0000 mg | ORAL_TABLET | Freq: Every day | ORAL | 1 refills | Status: DC

## 2019-07-16 NOTE — Assessment & Plan Note (Signed)
Increase Lantus 35 to in am and 30 at night. She was encouraged to schedule follow up with her endocrinologist. Will likely need to continue to titrate lantus up.

## 2019-07-16 NOTE — Assessment & Plan Note (Signed)
Patient improved with less frequent migraines after topiramate 25mg . No reporeted focal neurological findings.  -Will increase to 50mg  with goal of lessening frequency more.  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 Will complete this.  - follow up for continued titration of topiramate.

## 2019-07-17 ENCOUNTER — Encounter: Payer: Self-pay | Admitting: Vascular Surgery

## 2019-07-17 ENCOUNTER — Other Ambulatory Visit: Payer: Self-pay

## 2019-07-17 ENCOUNTER — Other Ambulatory Visit: Payer: Self-pay | Admitting: *Deleted

## 2019-07-17 ENCOUNTER — Encounter: Payer: Self-pay | Admitting: *Deleted

## 2019-07-17 ENCOUNTER — Ambulatory Visit (INDEPENDENT_AMBULATORY_CARE_PROVIDER_SITE_OTHER): Payer: 59 | Admitting: Vascular Surgery

## 2019-07-17 DIAGNOSIS — G458 Other transient cerebral ischemic attacks and related syndromes: Secondary | ICD-10-CM

## 2019-07-17 HISTORY — DX: Other transient cerebral ischemic attacks and related syndromes: G45.8

## 2019-07-17 NOTE — Progress Notes (Signed)
Patient name: Morgan [name] MRN: [id number] DOB: 1966/06/26 Sex: female  REASON FOR VISIT: Left subclavian steal, occluded left subclavian stent  HPI: Morgan [name] is a 53 y.o. female with history of hypertension, hyperlipidemia, diabetes, coronary artery disease status post NSTEMI 10/27/2018 with drug-eluting stents that vascular surgery was recently consulted for left subclavian stent occlusion and left subclavian steal syndrome by Dr. Gwenlyn Found.  Patient reportedly had a stent placed about 10 years ago at [facility].  Dr. Gwenlyn Found reports that she has been having increasing left upper extremity claudication over the last year.  She states that she has trouble typing at times and makes a lot of errors with her left hand.  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  She also endorses some dizziness.  Also reports having some trouble holding a cup with her left hand.    She is anxious to proceed with surgery given her insurance is going to change at the beginning of the year.  I did receive a message from Dr. Gwenlyn Found initially stating she needed to remain on Brilinta for 1 year but now he is okay with her coming off Brilinta for surgery.  Still having same issues with her left arm.  No head or neck surgery other than tonsillectomy.  Past Medical History:  Diagnosis Date  . Anxiety   . Asthma   . Bipolar depression ([place])   . Chronic bronchitis ([place])   . Chronic esophagogastric ulcer   . Chronic stomach ulcer   . Fibroid   . GERD (gastroesophageal reflux disease)   . Heart murmur   . Hepatic steatosis   . History of blood transfusion 2010   "related to subclavian stent"  . History of hiatal hernia   . Hyperlipidemia   . Hypertension   . Hypothyroid   . Migraine    "cerebral migraines; 1-2/month" (06/29/2018)  . Non-ST elevation (NSTEMI) myocardial infarction ([place]) 10/27/2018  . 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 Stroke [facility])      "I've had 2; most recent one was ~ 2015 or before; affected balance" (06/29/2018)  . Thalassemia minor   . Type II diabetes mellitus ([place])   . Vitamin D deficiency     Past Surgical History:  Procedure Laterality Date  . ABDOMINAL HYSTERECTOMY  2015   "still have 1 ovary"  . AORTIC ARCH ANGIOGRAPHY  06/14/2019  . AORTIC ARCH ANGIOGRAPHY N/A 06/14/2019   Procedure: AORTIC ARCH ANGIOGRAPHY;  Surgeon: Lorretta Harp, MD;  Location: [place] CV LAB;  Service: Cardiovascular;  Laterality: N/A;  . BIOPSY  07/25/2018   Procedure: BIOPSY;  Surgeon: Thornton Park, MD;  Location: [facility] ENDOSCOPY;  Service: Gastroenterology;;  . [place]; 1996  . CORONARY STENT INTERVENTION N/A 10/27/2018   Procedure: CORONARY STENT INTERVENTION;  Surgeon: Sherren Mocha, MD;  Location: [place] CV LAB;  Service: Cardiovascular;  Laterality: N/A;  . ESOPHAGOGASTRODUODENOSCOPY (EGD) WITH PROPOFOL N/A 07/25/2018   Procedure: ESOPHAGOGASTRODUODENOSCOPY (EGD) WITH PROPOFOL;  Surgeon: Thornton Park, MD;  Location: [place];  Service: Gastroenterology;  Laterality: N/A;  . KNEE RECONSTRUCTION Left 1980  . LAPAROSCOPIC CHOLECYSTECTOMY    . LEFT HEART CATH AND CORONARY ANGIOGRAPHY N/A 10/27/2018   Procedure: LEFT HEART CATH AND CORONARY ANGIOGRAPHY;  Surgeon: Sherren Mocha, MD;  Location: [place] CV LAB;  Service: Cardiovascular;  Laterality: N/A;  . SUBCLAVIAN STENT PLACEMENT Left 2010  . TONSILLECTOMY  1970  .  UPPER EXTREMITY ANGIOGRAPHY Right 06/14/2019   Procedure: UPPER EXTREMITY ANGIOGRAPHY;  Surgeon: Lorretta Harp, MD;  Location: [place] CV LAB;  Service: Cardiovascular;  Laterality: Right;  upper ext    Family History  Problem Relation Age of Onset  . Diabetes Mother   . Hypertension Mother   . Hyperlipidemia Mother   . CAD Mother   . Cervical cancer Mother   . CAD Father   . Heart failure Father   . Testicular cancer Father   . Hypertension Sister   .  Hyperlipidemia Sister   . Hypertension Brother   . Hyperlipidemia Brother     SOCIAL HISTORY: Social History   Tobacco Use  . Smoking status: Former Smoker    Packs/day: 2.00    Years: 36.00    Pack years: 72.00    Types: Cigarettes    Quit date: 05/14/2014    Years since quitting: 5.1  . Smokeless tobacco: Never Used  Substance Use Topics  . Alcohol use: Not Currently    Allergies  Allergen Reactions  . Ilosone [Erythromycin] Anaphylaxis  . Keflex [Cephalexin] Anaphylaxis  . Penicillins Anaphylaxis    Has patient had a PCN reaction causing immediate rash, facial/tongue/throat swelling, SOB or lightheadedness with hypotension: YES Has patient had a PCN reaction causing severe rash involving mucus membranes or skin necrosis: NO Has patient had a PCN reaction that required hospitalizationNO Has patient had a PCN reaction occurring within the last 10 years: NO If all of the above answers are "NO", then may proceed with Cephalosporin use.  . Iron Other (See Comments)    Pt reports condition limiting Iron intake.  . Metformin And Related Other (See Comments)    Pt prefers to not take this medication Side effects  . Morphine And Related Other (See Comments)    Pt reports hallucinations    Current Outpatient Medications  Medication Sig Dispense Refill  . Accu-Chek FastClix Lancets MISC 1 Package by Does not apply route 3 (three) times daily. 150 each 3  . albuterol (VENTOLIN HFA) 108 (90 Base) MCG/ACT inhaler Inhale 2 puffs into the lungs 2 (two) times daily as needed for wheezing or shortness of breath.    [name] aspirin 81 MG tablet Take 1 tablet (81 mg total) by mouth daily. 30 tablet 1  . atorvastatin (LIPITOR) 80 MG tablet Take 80 mg by mouth at bedtime.    . Blood Glucose Monitoring Suppl (CONTOUR NEXT ONE) KIT 1 kit by Does not apply route as directed. 1 kit 0  . Cholecalciferol (VITAMIN D3) 50 MCG (2000 UT) TABS Take 2,000-4,000 Units by mouth See admin instructions. Take 2  tablets by mouth once a day on Mon/Wed/Fri and take 1 tablet on Sun/Tues/Thurs/Sat    . EPINEPHrine 0.3 mg/0.3 mL IJ SOAJ injection Inject 0.3 mg into the muscle once as needed for anaphylaxis (for an anaphylactic reaction).   1  . FLUoxetine (PROZAC) 20 MG tablet Take 40 mg by mouth daily.    . fluticasone-salmeterol (ADVAIR HFA) 115-21 MCG/ACT inhaler Inhale 2 puffs into the lungs 2 (two) times daily.    . furosemide (LASIX) 20 MG tablet Take 1 tablet (20 mg total) by mouth 2 (two) times daily. 180 tablet 3  . GLUCAGON EMERGENCY 1 MG injection Inject 1 mg into the vein once as needed (FOR ONSET OF HYPOGLYCEMIA).   2  . glucose blood (CONTOUR NEXT TEST) test strip Use as instructed 400 each 12  . HYDROcodone-acetaminophen (NORCO) 5-325 MG tablet Take  1 tablet by mouth every 4 (four) hours as needed for severe pain. 15 tablet 0  . Insulin Glargine (LANTUS SOLOSTAR) 100 UNIT/ML Solostar Pen Inject 45 Units into the skin daily.    . insulin lispro (HUMALOG KWIKPEN) 100 UNIT/ML KwikPen Inject 0.05 mLs (5 Units total) into the skin 3 (three) times daily. Inject 5 units under the skin 3 times daily before meals. (Patient taking differently: Inject 10 Units into the skin 3 (three) times daily. Inject 5 units under the skin 3 times daily before meals. ) 15 mL 6  . Insulin Pen Needle 31G X 5 MM MISC 1 each by Does not apply route 3 (three) times daily. 270 each 2  . lamoTRIgine (LAMICTAL) 200 MG tablet Take 200 mg by mouth daily.     [name] levothyroxine (SYNTHROID, LEVOTHROID) 150 MCG tablet Take 150 mcg by mouth at bedtime.     [name] losartan (COZAAR) 100 MG tablet Take 1 tablet (100 mg total) by mouth daily. 90 tablet 3  . Lurasidone HCl (LATUDA) 60 MG TABS Take 60 mg by mouth at bedtime.     . metoprolol tartrate (LOPRESSOR) 50 MG tablet Take 1 tablet (50 mg total) by mouth 2 (two) times daily. 180 tablet 3  . Multiple Vitamin (MULTIVITAMIN WITH MINERALS) TABS tablet Take 1 tablet by mouth daily.    .  nitroGLYCERIN (NITROSTAT) 0.4 MG SL tablet Place 1 tablet (0.4 mg total) under the tongue every 5 (five) minutes x 3 doses as needed for chest pain. 30 tablet 12  . nystatin (MYCOSTATIN/NYSTOP) powder Apply topically as needed. (Patient taking differently: Apply 1 g topically daily as needed (infection). ) 15 g 0  . nystatin ointment (MYCOSTATIN) Apply 1 application topically daily as needed (infection).    . pantoprazole (PROTONIX) 40 MG tablet Take 1 tablet (40 mg total) by mouth 2 (two) times daily before a meal. X 8 weeks (Patient taking differently: Take 40 mg by mouth daily. Additional 20 mg as needed) 60 tablet 3  . potassium chloride SA (KLOR-CON) 20 MEQ tablet Take 1 tablet (20 mEq total) by mouth 2 (two) times daily. 10 tablet 0  . ticagrelor (BRILINTA) 90 MG TABS tablet Take 1 tablet (90 mg total) by mouth 2 (two) times daily. 180 tablet 3  . topiramate (TOPAMAX) 50 MG tablet Take 1 tablet (50 mg total) by mouth at bedtime. 30 tablet 1  . butalbital-acetaminophen-caffeine (FIORICET, ESGIC) 50-325-40 MG tablet Take 1-2 tablets by mouth every 6 (six) hours as needed for headache or migraine. (Patient not taking: Reported on 07/17/2019) 30 tablet 0   No current facility-administered medications for this visit.    REVIEW OF SYSTEMS:  '[X]'  denotes positive finding, '[ ]'  denotes negative finding Cardiac  Comments:  Chest pain or chest pressure:    Shortness of breath upon exertion:    Short of breath when lying flat:    Irregular heart rhythm:        Vascular    Pain in calf, thigh, or hip brought on by ambulation:    Pain in feet at night that wakes you up from your sleep:     Blood clot in your veins:    Leg swelling:         Pulmonary    Oxygen at home:    Productive cough:     Wheezing:         Neurologic    Sudden weakness in arms or legs:     Sudden numbness  in arms or legs:     Sudden onset of difficulty speaking or slurred speech:    Temporary loss of vision in one eye:      Problems with dizziness:  x       Gastrointestinal    Blood in stool:     Vomited blood:         Genitourinary    Burning when urinating:     Blood in urine:        Psychiatric    Major depression:         Hematologic    Bleeding problems:    Problems with blood clotting too easily:        Skin    Rashes or ulcers:        Constitutional    Fever or chills:      PHYSICAL EXAM: Vitals:   07/17/19 0810 07/17/19 0818  BP: 126/74 90/62  Pulse: 76 76  Resp: 16   Temp: (!) 97 F (36.1 C)   TempSrc: Temporal   SpO2: 98%   Weight: 220 lb (99.8 kg)   Height: '5\' 3"'  (1.6 m)     GENERAL: The patient is a well-nourished female, in no acute distress. The vital signs are documented above. CARDIAC: There is a regular rate and rhythm.  VASCULAR:  Right radial artery 2+ palpable, left radial artery nonpalpable Neuro: Cranial nerves II through XII grossly intact, no appreciable deficits  DATA:   Carotid duplex 04/17/2019: No significant ICA or carotid disease.  Left vertebral artery has retrograde flow.  Arch aortogram by Dr. Gwenlyn Found: On my review the left carotid is free of disease.  The left vertebral artery is free of disease.  The left subclavian stent is occluded.  Assessment/Plan:  53 year old female with occluded left subclavian stent that was placed about 10 years ago [facility].  She has symptoms consistent with left subclavian steal syndrome with left arm fatigue and claudication as well as some dizziness.  She is anxious to proceed with surgery given her insurance will change at the beginning of the year.  I have recommended a left carotid subclavian bypass.  I have gotten a message from Dr. Gwenlyn Found that he is okay holding her Brilinta for surgery even though it has not been one year since her drug-eluting stents were placed.  Will need her off Brilinta for 5 days.  Surgery is planned for Monday, December 28.  We discussed risks and benefits including risk of vessel  injury, stroke, infection, nerve injury, chyle leak, failure of bypass etc.   Marty Heck, MD [facility] [place] Office: [phone] Pager: [phone]

## 2019-07-20 ENCOUNTER — Encounter: Payer: Self-pay | Admitting: Internal Medicine

## 2019-07-20 ENCOUNTER — Ambulatory Visit (INDEPENDENT_AMBULATORY_CARE_PROVIDER_SITE_OTHER): Payer: 59 | Admitting: Internal Medicine

## 2019-07-20 VITALS — BP 122/78 | HR 85 | Temp 98.7°F | Ht 63.0 in | Wt 224.4 lb

## 2019-07-20 DIAGNOSIS — E1165 Type 2 diabetes mellitus with hyperglycemia: Secondary | ICD-10-CM

## 2019-07-20 DIAGNOSIS — E1159 Type 2 diabetes mellitus with other circulatory complications: Secondary | ICD-10-CM

## 2019-07-20 DIAGNOSIS — Z794 Long term (current) use of insulin: Secondary | ICD-10-CM

## 2019-07-20 MED ORDER — LANTUS SOLOSTAR 100 UNIT/ML ~~LOC~~ SOPN: 35.0000 [IU] | PEN_INJECTOR | Freq: Two times a day (BID) | SUBCUTANEOUS | 11 refills | Status: DC

## 2019-07-20 MED ORDER — HUMALOG KWIKPEN 200 UNIT/ML ~~LOC~~ SOPN: 20.0000 [IU] | PEN_INJECTOR | Freq: Three times a day (TID) | SUBCUTANEOUS | 11 refills | Status: DC

## 2019-07-20 MED ORDER — PEN NEEDLES 32G X 4 MM MISC: 1.0000 | Freq: Every day | 11 refills | Status: DC

## 2019-07-20 NOTE — Progress Notes (Signed)
PATIENT IDENTIFIER: Morgan [name] is a 53 y.o. female with a past medical history of IDDM, HTN, Bipolar d/o,hypothyroidism, Hx of CVA, and  CAD (S/P DES 10/2018) . The patient has followed with Endocrinology clinic since 09/25/2018 for consultative assistance with management of her diabetes.  DIABETIC HISTORY:  Morgan [name] was diagnosed with T2DM many years ago. She is intolerant to Metformin due to diarrhea. She has been on SU and actos in the past without reported intolerance. She has been on an insulin pump since 2017. Her hemoglobin A1c has ranged from 9.8% in 06/2018, peaking at 13.2% in 2017.   Works 11 AM to 10 pm for [employer]   On her initial presentation to our clinic her A1c was 10.4 %. She was on medtronic pump with a daytime hourly rate of 8 u/hr from 9 am to MN. She was also on Ozempic 0.5 mg weekly.   She is intolerant to Ozempicdue to GI side effects   In 05/2019 she presented to the ED with nausea and vomiting, found to have a BG 870 mg/dL with severe acidosis and was switched from the pump to MDI regimen. She was NOT restarted on the pump again as she has been a poor candidate for it.   Historically she has had incidents where the pump would malfunction and the patient would not be aware of this and had presented to Ed with Bg's in the 700's   SUBJECTIVE:   During the last visit (05/18/2019): This was a hospital follow up , her pump was discontinued and discharged on MDI regimen. .   Today (07/20/2019): Morgan [name] is here for a follow up on diabetes management. She has continued with hyperglycemia and had seen her PCP in the interm, pt is currently on Lantus 35 units BID. She assures me compliance with insulin intake, she does take humalog with coffee but she states that she eats when she drinks coffee too.   She is schedule to have a left subclavian graft next week due to 100 % occluded stent.   ROS: As per HPI and as detailed below: Review of Systems    Constitutional: Negative for fever and malaise/fatigue.  HENT: Negative for congestion and sore throat.   Respiratory: Positive for shortness of breath. Negative for cough.   Cardiovascular: Positive for chest pain. Negative for palpitations.  Gastrointestinal: Positive for diarrhea. Negative for nausea.      HOME DIABETES REGIMEN:  Lantus 48  units daily - taking 35 units BID  Humalog 10 units with each meal   METER DOWNLOAD SUMMARY: 11/19-12/18/2020 Fingerstick Blood Glucose Tests = 17 Average Number Tests/Day = 0.6 Overall Mean FS Glucose = 490 Standard Deviation = 109  BG Ranges: Low = 282 High = High   Hypoglycemic Events/30 Days: BG < 50 = 0 Episodes of symptomatic severe hypoglycemia = 0      HISTORY:  Past Medical History:  Past Medical History:  Diagnosis Date  . Anxiety   . Asthma   . Bipolar depression ([place])   . Chronic bronchitis ([place])   . Chronic esophagogastric ulcer   . Chronic stomach ulcer   . Fibroid   . GERD (gastroesophageal reflux disease)   . Heart murmur   . Hepatic steatosis   . History of blood transfusion 2010   "related to subclavian stent"  . History of hiatal hernia   . Hyperlipidemia   . Hypertension   . Hypothyroid   . Migraine    "  cerebral migraines; 1-2/month" (06/29/2018)  . Non-ST elevation (NSTEMI) myocardial infarction ([place]) 10/27/2018  . 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 Stroke [facility])    "I've had 2; most recent one was ~ 2015 or before; affected balance" (06/29/2018)  . Thalassemia minor   . Type II diabetes mellitus ([place])   . Vitamin D deficiency    Past Surgical History:  Past Surgical History:  Procedure Laterality Date  . ABDOMINAL HYSTERECTOMY  2015   "still have 1 ovary"  . AORTIC ARCH ANGIOGRAPHY  06/14/2019  . AORTIC ARCH ANGIOGRAPHY N/A 06/14/2019   Procedure: AORTIC ARCH ANGIOGRAPHY;  Surgeon: Lorretta Harp, MD;  Location: [place] CV LAB;   Service: Cardiovascular;  Laterality: N/A;  . BIOPSY  07/25/2018   Procedure: BIOPSY;  Surgeon: Thornton Park, MD;  Location: [facility] ENDOSCOPY;  Service: Gastroenterology;;  . [place]; 1996  . CORONARY STENT INTERVENTION N/A 10/27/2018   Procedure: CORONARY STENT INTERVENTION;  Surgeon: Sherren Mocha, MD;  Location: [place] CV LAB;  Service: Cardiovascular;  Laterality: N/A;  . ESOPHAGOGASTRODUODENOSCOPY (EGD) WITH PROPOFOL N/A 07/25/2018   Procedure: ESOPHAGOGASTRODUODENOSCOPY (EGD) WITH PROPOFOL;  Surgeon: Thornton Park, MD;  Location: [place];  Service: Gastroenterology;  Laterality: N/A;  . KNEE RECONSTRUCTION Left 1980  . LAPAROSCOPIC CHOLECYSTECTOMY    . LEFT HEART CATH AND CORONARY ANGIOGRAPHY N/A 10/27/2018   Procedure: LEFT HEART CATH AND CORONARY ANGIOGRAPHY;  Surgeon: Sherren Mocha, MD;  Location: [place] CV LAB;  Service: Cardiovascular;  Laterality: N/A;  . SUBCLAVIAN STENT PLACEMENT Left 2010  . TONSILLECTOMY  1970  . UPPER EXTREMITY ANGIOGRAPHY Right 06/14/2019   Procedure: UPPER EXTREMITY ANGIOGRAPHY;  Surgeon: Lorretta Harp, MD;  Location: [place] CV LAB;  Service: Cardiovascular;  Laterality: Right;  upper ext    Social History:  reports that she quit smoking about 5 years ago. Her smoking use included cigarettes. She has a 72.00 pack-year smoking history. She has never used smokeless tobacco. She reports previous alcohol use. She reports previous drug use. Family History:  Family History  Problem Relation Age of Onset  . Diabetes Mother   . Hypertension Mother   . Hyperlipidemia Mother   . CAD Mother   . Cervical cancer Mother   . CAD Father   . Heart failure Father   . Testicular cancer Father   . Hypertension Sister   . Hyperlipidemia Sister   . Hypertension Brother   . Hyperlipidemia Brother      HOME MEDICATIONS: Allergies as of 07/20/2019      Reactions   Ilosone [erythromycin] Anaphylaxis   Keflex  [cephalexin] Anaphylaxis   Penicillins Anaphylaxis   Has patient had a PCN reaction causing immediate rash, facial/tongue/throat swelling, SOB or lightheadedness with hypotension: YES Has patient had a PCN reaction causing severe rash involving mucus membranes or skin necrosis: NO Has patient had a PCN reaction that required hospitalizationNO Has patient had a PCN reaction occurring within the last 10 years: NO If all of the above answers are "NO", then may proceed with Cephalosporin use.   Iron Other (See Comments)   Pt reports condition limiting Iron intake.   Metformin And Related Other (See Comments)   Pt prefers to not take this medication Side effects   Morphine And Related Other (See Comments)   Pt reports hallucinations      Medication List       Accurate as of July 20, 2019  2:50 PM. If you have any questions, ask your nurse or doctor.        Accu-Chek FastClix Lancets Misc 1 Package by Does not apply route 3 (three) times daily.   albuterol 108 (90 Base) MCG/ACT inhaler Commonly known as: VENTOLIN HFA Inhale 2 puffs into the lungs 2 (two) times daily as needed for wheezing or shortness of breath.   aspirin 81 MG tablet Take 1 tablet (81 mg total) by mouth daily.   atorvastatin 80 MG tablet Commonly known as: LIPITOR Take 80 mg by mouth at bedtime.   butalbital-acetaminophen-caffeine 50-325-40 MG tablet Commonly known as: FIORICET Take 1-2 tablets by mouth every 6 (six) hours as needed for headache or migraine.   Contour Next One Kit 1 kit by Does not apply route as directed.   Contour Next Test test strip Generic drug: glucose blood Use as instructed   EPINEPHrine 0.3 mg/0.3 mL Soaj injection Commonly known as: EPI-PEN Inject 0.3 mg into the muscle once as needed for anaphylaxis (for an anaphylactic reaction).   FLUoxetine 20 MG tablet Commonly known as: PROZAC Take 60 mg by mouth daily.   fluticasone-salmeterol 115-21 MCG/ACT inhaler Commonly  known as: ADVAIR HFA Inhale 2 puffs into the lungs 2 (two) times daily.   furosemide 20 MG tablet Commonly known as: LASIX Take 1 tablet (20 mg total) by mouth 2 (two) times daily.   Glucagon Emergency 1 MG Kit Inject 1 mg into the vein once as needed (FOR ONSET OF HYPOGLYCEMIA).   HYDROcodone-acetaminophen 5-325 MG tablet Commonly known as: Norco Take 1 tablet by mouth every 4 (four) hours as needed for severe pain.   insulin lispro 100 UNIT/ML KwikPen Commonly known as: HumaLOG KwikPen Inject 0.05 mLs (5 Units total) into the skin 3 (three) times daily. Inject 5 units under the skin 3 times daily before meals. What changed:   how much to take  additional instructions   Insulin Pen Needle 31G X 5 MM Misc 1 each by Does not apply route 3 (three) times daily.   lamoTRIgine 200 MG tablet Commonly known as: LAMICTAL Take 200 mg by mouth daily.   Lantus SoloStar 100 UNIT/ML Solostar Pen Generic drug: Insulin Glargine Inject 35 Units into the skin 2 (two) times daily.   Latuda 60 MG Tabs Generic drug: Lurasidone HCl Take 60 mg by mouth at bedtime.   levothyroxine 150 MCG tablet Commonly known as: SYNTHROID Take 150 mcg by mouth at bedtime. 3:30 Am   losartan 100 MG tablet Commonly known as: COZAAR Take 1 tablet (100 mg total) by mouth daily.   metoprolol tartrate 50 MG tablet Commonly known as: LOPRESSOR Take 1 tablet (50 mg total) by mouth 2 (two) times daily.   multivitamin with minerals Tabs tablet Take 1 tablet by mouth daily. Woman 50 +   nitroGLYCERIN 0.4 MG SL tablet Commonly known as: NITROSTAT Place 1 tablet (0.4 mg total) under the tongue every 5 (five) minutes x 3 doses as needed for chest pain.   nystatin ointment Commonly known as: MYCOSTATIN Apply 1 application topically daily as needed (infection). What changed: Another medication with the same name was changed. Make sure you understand how and when to take each.   nystatin powder Commonly  known as: MYCOSTATIN/NYSTOP Apply topically as needed. What changed:   how much to take  when to take this  reasons to take this   pantoprazole 40 MG tablet Commonly known as: PROTONIX Take 1 tablet (40 mg total) by mouth 2 (  two) times daily before a meal. X 8 weeks What changed:   how much to take  when to take this  additional instructions   potassium chloride SA 20 MEQ tablet Commonly known as: KLOR-CON Take 1 tablet (20 mEq total) by mouth 2 (two) times daily.   ticagrelor 90 MG Tabs tablet Commonly known as: BRILINTA Take 1 tablet (90 mg total) by mouth 2 (two) times daily.   topiramate 50 MG tablet Commonly known as: Topamax Take 1 tablet (50 mg total) by mouth at bedtime.   traMADol 50 MG tablet Commonly known as: ULTRAM Take 50 mg by mouth every 6 (six) hours as needed for moderate pain (Migraine).   Vitamin D3 25 MCG (1000 UT) Caps Take 1,000-2,000 Units by mouth See admin instructions. Take 2 tablets by mouth once a day on Mon/Wed/Fri and take 1 tablet on Sun/Tues/Thurs/Sat      PHYSICAL EXAM: VS: BP 122/78 (BP Location: Right Arm, Patient Position: Sitting, Cuff Size: Large)   Pulse 85   Temp 98.7 F (37.1 C)   Ht '5\' 3"'  (1.6 m)   Wt 224 lb 6.4 oz (101.8 kg)   LMP 08/03/2013   SpO2 98%   BMI 39.75 kg/m    EXAM: General: Pt appears well and is in NAD  Lungs: Clear with good BS bilat with no rales, rhonchi, or wheezes  Heart: Auscultation: RRR   Extremities: BL LE: no pretibial edema normal   Neuro: Cranial nerves: II - XII grossly intact ;  Motor: normal strength throughout  Mental Status: Mood and affect: no depression, anxiety, or agitation    DM Foot exam (07/20/2019) The skin of the feet is without sores or ulcerations. The pedal pulses are 1+ on right and 1+ on left. The sensation is decreased to a screening 5.07, 10 gram monofilament bilaterally   DATA REVIEWED:  Lab Results  Component Value Date   HGBA1C 12.6 (H) 05/12/2019    HGBA1C 10.4 (A) 01/30/2019   HGBA1C 10.4 (H) 07/26/2018   Lab Results  Component Value Date   LDLCALC 50 06/15/2019   CREATININE 0.76 06/15/2019     ASSESSMENT / PLAN / RECOMMENDATIONS:   1) Type 2 Diabetes Mellitus, Poorly controlled, With macrovascular complications - Most recent A1c of 12.6 %. Goal A1c < 7.0 %.   - We discussed the importance of frequent BG checks , as this helps me make better decisions about her insulin regimen. I have advised to try and check glucose before meals and at bedtime when possible - She is currently not on enough prandial insulin - Pt may contact us next week if BG's consistently > 250 mg/dL, our goal is to improve glycemic control as quickly as possible in preparation of her procedure next week - She has been advised to avoid sugar sweetened beverages, which she does for the most part except for the occasional intake of juice.   MEDICATIONS:  - Continue  Lantus 35 units BID - Increase  Humalog to 20  units with each meal   EDUCATION / INSTRUCTIONS:  BG monitoring instructions: Patient is instructed to check her blood sugars 4 times a day, before meals and bedtime.  Call [facility] if: BG persistently < 70 or > 300. . I reviewed the Rule of 15 for the treatment of hypoglycemia in detail with the patient. Literature supplied.     F/U in 3 months    Signed electronically by: Mack Guise, MD  [facility]  [facility] [address], [place], [place] [address] Phone: [phone] FAX: [phone]   CC: [place], [place], [place] [address] Phone: [phone]  Fax: [phone]  Return to Endocrinology clinic as below: Future Appointments  Date Time Provider [place]  07/26/2019 10:30 AM [facility]-SCREENING [facility] None  07/26/2019 11:00 AM [facility] PAT 1 [facility] None  10/01/2019  2:00 PM Josue Hector, MD [facility]

## 2019-07-20 NOTE — Patient Instructions (Signed)
-   Continue Lantus 35 units twice a day   - increase Humalog to 20 units with each meal     HOW TO TREAT LOW BLOOD SUGARS (Blood sugar LESS THAN 70 MG/DL)  Please follow the RULE OF 15 for the treatment of hypoglycemia treatment (when your (blood sugars are less than 70 mg/dL)    STEP 1: Take 15 grams of carbohydrates when your blood sugar is low, which includes:   3-4 GLUCOSE TABS  OR  3-4 OZ OF JUICE OR REGULAR SODA OR  ONE TUBE OF GLUCOSE GEL     STEP 2: RECHECK blood sugar in 15 MINUTES STEP 3: If your blood sugar is still low at the 15 minute recheck --> then, go back to STEP 1 and treat AGAIN with another 15 grams of carbohydrates.

## 2019-07-23 ENCOUNTER — Other Ambulatory Visit: Payer: Self-pay | Admitting: Family Medicine

## 2019-07-24 ENCOUNTER — Encounter: Payer: Self-pay | Admitting: Internal Medicine

## 2019-07-24 ENCOUNTER — Encounter: Payer: Self-pay | Admitting: Family Medicine

## 2019-07-24 MED ORDER — HUMALOG KWIKPEN 200 UNIT/ML ~~LOC~~ SOPN: 25.0000 [IU] | PEN_INJECTOR | Freq: Three times a day (TID) | SUBCUTANEOUS | 11 refills | Status: DC

## 2019-07-25 NOTE — Progress Notes (Signed)
[place], [place] [address] Phone: [phone] Fax: [phone]      Your procedure is scheduled on July 30, 2019.  Report to [facility] Main Entrance "A" at 5:30 A.M., and check in at the Admitting office.  Call this number if you have problems the morning of surgery:  [phone]  Call [phone] if you have any questions prior to your surgery date Monday-Friday 8am-4pm    Remember:  Do not eat or drink after midnight the night before your surgery   Take these medicines the morning of surgery with A SIP OF WATER: FLUoxetine (PROZAC) fluticasone-salmeterol (ADVAIR HFA) lamoTRIgine (LAMICTAL) metoprolol tartrate (LOPRESSOR) pantoprazole (PROTONIX) albuterol (VENTOLIN HFA) - as needed EPINEPHrine - as needed HYDROcodone-acetaminophen (NORCO) - as needed nitroGLYCERIN (NITROSTAT) - if needed traMADol (ULTRAM) - as needed  As of today, STOP taking any Aspirin (unless otherwise instructed by your surgeon), Aleve, Naproxen, Ibuprofen, Motrin, Advil, Goody's, BC's, all herbal medications, fish oil, and all vitamins.  Follow your surgeon's instructions on when to stop Brilinta.  If no instructions were given by your surgeon then you will need to call the office to get those instructions.     WHAT DO I DO ABOUT MY DIABETES MEDICATION?   [name] Do not take oral diabetes medicines (pills) the morning of surgery.  . THE NIGHT BEFORE SURGERY, Do NOT take Lantus  Or Humalog Insulin.      . THE MORNING OF SURGERY, Do NOT take Lantus Or Humalog Insulin.  . The day of surgery, do not take other diabetes injectables, including Byetta (exenatide), Bydureon (exenatide ER), Victoza (liraglutide), or Trulicity (dulaglutide).  . If your CBG is greater than 220 mg/dL, you may take  of your sliding scale (correction) dose of insulin.   HOW TO MANAGE YOUR DIABETES BEFORE AND AFTER SURGERY  Why is it important to  control my blood sugar before and after surgery? . Improving blood sugar levels before and after surgery helps healing and can limit problems. . A way of improving blood sugar control is eating a healthy diet by: o  Eating less sugar and carbohydrates o  Increasing activity/exercise o  Talking with your doctor about reaching your blood sugar goals . High blood sugars (greater than 180 mg/dL) can raise your risk of infections and slow your recovery, so you will need to focus on controlling your diabetes during the weeks before surgery. . Make sure that the doctor who takes care of your diabetes knows about your planned surgery including the date and location.  How do I manage my blood sugar before surgery? . Check your blood sugar at least 4 times a day, starting 2 days before surgery, to make sure that the level is not too high or low. . Check your blood sugar the morning of your surgery when you wake up and every 2 hours until you get to the Short Stay unit. o If your blood sugar is less than 70 mg/dL, you will need to treat for low blood sugar: - Do not take insulin. - Treat a low blood sugar (less than 70 mg/dL) with  cup of clear juice (cranberry or apple), 4 glucose tablets, OR glucose gel. - Recheck blood sugar in 15 minutes after treatment (to make sure it is greater than 70 mg/dL). If your blood sugar is not greater than 70 mg/dL on recheck, call [phone] for further instructions. . Report your blood sugar to the  short stay nurse when you get to Short Stay.  . If you are admitted to the hospital after surgery: o Your blood sugar will be checked by the staff and you will probably be given insulin after surgery (instead of oral diabetes medicines) to make sure you have good blood sugar levels. o The goal for blood sugar control after surgery is 80-180 mg/dL.  The Morning of Surgery  Do not wear jewelry, make-up or nail polish.  Do not wear lotions, powders, or perfumes or  deodorant  Do not shave 48 hours prior to surgery.    Do not bring valuables to the hospital.  [facility] is not responsible for any belongings or valuables.  If you are a smoker, DO NOT Smoke 24 hours prior to surgery  If you wear a CPAP at night please bring your mask, tubing, and machine the morning of surgery   Remember that you must have someone to transport you home after your surgery, and remain with you for 24 hours if you are discharged the same day.   Please bring cases for contacts, glasses, hearing aids, dentures or bridgework because it cannot be worn into surgery.    Leave your suitcase in the car.  After surgery it may be brought to your room.  For patients admitted to the hospital, discharge time will be determined by your treatment team.  Patients discharged the day of surgery will not be allowed to drive home.    Special instructions:   [facility]- Preparing For Surgery  Before surgery, you can play an important role. Because skin is not sterile, your skin needs to be as free of germs as possible. You can reduce the number of germs on your skin by washing with CHG (chlorahexidine gluconate) Soap before surgery.  CHG is an antiseptic cleaner which kills germs and bonds with the skin to continue killing germs even after washing.    Oral Hygiene is also important to reduce your risk of infection.  Remember - BRUSH YOUR TEETH THE MORNING OF SURGERY WITH YOUR REGULAR TOOTHPASTE  Please do not use if you have an allergy to CHG or antibacterial soaps. If your skin becomes reddened/irritated stop using the CHG.  Do not shave (including legs and underarms) for at least 48 hours prior to first CHG shower. It is OK to shave your face.  Please follow these instructions carefully.   1. Shower the NIGHT BEFORE SURGERY and the MORNING OF SURGERY with CHG Soap.   2. If you chose to wash your hair, wash your hair first as usual with your normal shampoo.  3. After you shampoo,  rinse your hair and body thoroughly to remove the shampoo.  4. Use CHG as you would any other liquid soap. You can apply CHG directly to the skin and wash gently with a scrungie or a clean washcloth.   5. Apply the CHG Soap to your body ONLY FROM THE NECK DOWN.  Do not use on open wounds or open sores. Avoid contact with your eyes, ears, mouth and genitals (private parts). Wash Face and genitals (private parts)  with your normal soap.   6. Wash thoroughly, paying special attention to the area where your surgery will be performed.  7. Thoroughly rinse your body with warm water from the neck down.  8. DO NOT shower/wash with your normal soap after using and rinsing off the CHG Soap.  9. Pat yourself dry with a CLEAN TOWEL.  10. Wear CLEAN PAJAMAS  to bed the night before surgery, wear comfortable clothes the morning of surgery  11. Place CLEAN SHEETS on your bed the night of your first shower and DO NOT SLEEP WITH PETS.    Day of Surgery:  Please shower the morning of surgery with the CHG soap Do not apply any deodorants/lotions. Please wear clean clothes to the hospital/surgery center.   Remember to brush your teeth WITH YOUR REGULAR TOOTHPASTE.   Please read over the following fact sheets that you were given.

## 2019-07-26 ENCOUNTER — Other Ambulatory Visit (HOSPITAL_COMMUNITY)
Admission: RE | Admit: 2019-07-26 | Discharge: 2019-07-26 | Disposition: A | Discharge status: Home / Self Care | Payer: 59 | Source: Ambulatory Visit | Attending: Vascular Surgery | Admitting: Vascular Surgery

## 2019-07-26 ENCOUNTER — Encounter (HOSPITAL_COMMUNITY)
Admission: RE | Admit: 2019-07-26 | Discharge: 2019-07-26 | Disposition: A | Discharge status: Home / Self Care | Payer: 59 | Source: Ambulatory Visit | Attending: Vascular Surgery | Admitting: Vascular Surgery

## 2019-07-26 ENCOUNTER — Other Ambulatory Visit: Payer: Self-pay

## 2019-07-26 ENCOUNTER — Encounter: Payer: Self-pay | Admitting: Internal Medicine

## 2019-07-26 ENCOUNTER — Encounter (HOSPITAL_COMMUNITY): Payer: Self-pay

## 2019-07-26 DIAGNOSIS — Z01818 Encounter for other preprocedural examination: Secondary | ICD-10-CM | POA: Diagnosis not present

## 2019-07-26 DIAGNOSIS — Z20828 Contact with and (suspected) exposure to other viral communicable diseases: Secondary | ICD-10-CM | POA: Insufficient documentation

## 2019-07-26 DIAGNOSIS — E119 Type 2 diabetes mellitus without complications: Secondary | ICD-10-CM | POA: Diagnosis not present

## 2019-07-26 DIAGNOSIS — I2510 Atherosclerotic heart disease of native coronary artery without angina pectoris: Secondary | ICD-10-CM | POA: Diagnosis not present

## 2019-07-26 DIAGNOSIS — I6523 Occlusion and stenosis of bilateral carotid arteries: Secondary | ICD-10-CM | POA: Diagnosis not present

## 2019-07-26 DIAGNOSIS — I252 Old myocardial infarction: Secondary | ICD-10-CM | POA: Insufficient documentation

## 2019-07-26 DIAGNOSIS — Z955 Presence of coronary angioplasty implant and graft: Secondary | ICD-10-CM | POA: Diagnosis not present

## 2019-07-26 LAB — PROTIME-INR
INR: 0.9 (ref 0.8–1.2)
Prothrombin Time: 12.4 seconds (ref 11.4–15.2)

## 2019-07-26 LAB — APTT: aPTT: 20 seconds — ABNORMAL LOW (ref 24–36)

## 2019-07-26 LAB — SURGICAL PCR SCREEN
MRSA, PCR: NEGATIVE
Staphylococcus aureus: NEGATIVE

## 2019-07-26 LAB — CBC
HCT: 42.6 % (ref 36.0–46.0)
Hemoglobin: 12.7 g/dL (ref 12.0–15.0)
MCH: 19.9 pg — ABNORMAL LOW (ref 26.0–34.0)
MCHC: 29.8 g/dL — ABNORMAL LOW (ref 30.0–36.0)
MCV: 66.8 fL — ABNORMAL LOW (ref 80.0–100.0)
Platelets: 329 10*3/uL (ref 150–400)
RBC: 6.38 MIL/uL — ABNORMAL HIGH (ref 3.87–5.11)
RDW: 17.3 % — ABNORMAL HIGH (ref 11.5–15.5)
WBC: 12.8 10*3/uL — ABNORMAL HIGH (ref 4.0–10.5)
nRBC: 0 % (ref 0.0–0.2)

## 2019-07-26 LAB — COMPREHENSIVE METABOLIC PANEL
ALT: 27 U/L (ref 0–44)
AST: 29 U/L (ref 15–41)
Albumin: 3.4 g/dL — ABNORMAL LOW (ref 3.5–5.0)
Alkaline Phosphatase: 80 U/L (ref 38–126)
Anion gap: 11 (ref 5–15)
BUN: 7 mg/dL (ref 6–20)
CO2: 23 mmol/L (ref 22–32)
Calcium: 8.9 mg/dL (ref 8.9–10.3)
Chloride: 103 mmol/L (ref 98–111)
Creatinine, Ser: 0.74 mg/dL (ref 0.44–1.00)
GFR calc Af Amer: >60 mL/min (ref >60)
GFR calc non Af Amer: >60 mL/min (ref >60)
Glucose, Bld: 243 mg/dL — ABNORMAL HIGH (ref 70–99)
Potassium: 4.3 mmol/L (ref 3.5–5.1)
Sodium: 137 mmol/L (ref 135–145)
Total Bilirubin: 0.9 mg/dL (ref 0.3–1.2)
Total Protein: 6.1 g/dL — ABNORMAL LOW (ref 6.5–8.1)

## 2019-07-26 LAB — GLUCOSE, CAPILLARY: Glucose-Capillary: 233 mg/dL — ABNORMAL HIGH (ref 70–99)

## 2019-07-26 MED ORDER — VANCOMYCIN HCL IN DEXTROSE 1-5 GM/200ML-% IV SOLN
1000.0000 mg | INTRAVENOUS | Status: AC
  Administered 2019-07-30: 1000 mg via INTRAVENOUS
  Filled 2019-07-26: qty 200

## 2019-07-26 NOTE — Progress Notes (Signed)
PCP:  [place], [place], DO Cardiologist:  Dr. Johnsie Cancel  EKG:  05/13/19 CXR:  05/12/19 ECHO:  04/03/19 Stress Test:  denies Cardiac Cath:  06/2019  Fasting Blood Sugar-  250-350 Checks Blood Sugar_2__ times a day  Last dose Brilinta 07/24/19  Anesthesia Review:  Yes, cardiac history.  Patient denies shortness of breath, fever, cough, and chest pain at PAT appointment.  Patient verbalized understanding of instructions provided today at the PAT appointment.  Patient asked to review instructions at home and day of surgery.

## 2019-07-26 NOTE — Progress Notes (Signed)
Blood bank called and stated patient had an antibody present in her type and screen. Sample was not enough to run a crossmatch, so will need a new sample drawn on DOS for crossmatch.

## 2019-07-26 NOTE — Progress Notes (Signed)
Anesthesia Chart Review:  Follows with cardiology for hx of CAD s/p NSTEMI March 2020 - cath showed severe LAD and D1 stenosis treated with DES.  Echo 10/26/2018 showed EF 45 to 50% with regional wall motion abnormality in the septal and apex consistent with her CAD. Followup echo 9/20 with normalized EF. Carotid Doppler obtained on 04/17/2019 showed mild bilateral ICA disease, monophasic waveform in the left subclavian artery suggesting proximal stenosis with subclavian steal syndrome.  Subsequent CT angiogram obtained on 9/25 confirmed occluded left subclavian artery stent that was placed a decade ago at [facility].  She was evaluated by Dr. Alvester Chou and eventually underwent peripheral vascular catheterization 06/14/2019 with aortic arch angiography.  The procedure demonstrated type I arch with occluded left subclavian artery stent at its origin and throughout its entirety with retrograde left vertebral filling of the left subclavian artery.  Dr. Gwenlyn Found recommended left common carotid to subclavian bypass.  The patient was referred to Dr. Fortunato Curling of vascular surgery.  Per Dr. Ainsley Spinner note from 07/17/19, Dr. Gwenlyn Found has cleared pt to hold Brilinta for surgery.   Uncontrolled IDDMII followed by endocrinology, Dr. Kelton Pillar. Last a1c 12.6 on 05/12/19. CBG 233 at PAT on 07/26/19. Pt understands markedly uncontrolled BG on DOS could cause cancellation.  Carotid duplex 04/17/19: Right Carotid: Velocities in the right ICA are consistent with a 1-39% stenosis.                The ECA appears >50% stenosed.  Left Carotid: Velocities in the left ICA are consistent with a 1-39% stenosis.               Non-hemodynamically significant plaque <50% noted in the CCA.  Vertebrals:  Right vertebral artery demonstrates antegrade flow. Left vertebral              artery demonstrates retrograde flow. Subclavians: Normal flow hemodynamics were seen in the right subclavian artery.              Left subclavian stent  struts not seen due to body habitus. The left              subclavian artery showed a monophasic waveform with a delayed              acceleration suggesting a proximal stenosis with subclavian steal              syndrome. The brachial artery in the right distal humerus showed a              triphasic waveform with a velocity of 136/11 cm/s. The brachial              artery in the left distal humerus showed a monophasic waveform with              a velocity of 37/8 cm/s. The left arm blood pressure was              unobtainable.  TTE 04/03/19:  1. The left ventricle has hyperdynamic systolic function, with an ejection fraction of >65%. The cavity size was normal. Left ventricular diastolic Doppler parameters are consistent with pseudonormalization.  2. The right ventricle has normal systolic function. The cavity was normal. There is no increase in right ventricular wall thickness.  3. The aortic valve is bicuspid. Mild thickening of the aortic valve.  4. The aorta is normal unless otherwise noted.  5. The atrial septum is grossly normal.  Cath and PCI 10/27/18: 1. Severe LAD and  diagonal stenoses, treated successfully with DES implantation in each vessel 2. Nonobstructive RCA and LCx stenosis 3. Mildly elevated LVEDP  Recommend: ASA/ticagrelor x 12 months without interruption. OK for hospital DC tomorrow if no early complications arise.   Wynonia Must[facility] Short Stay Center/Anesthesiology Phone [phone] 07/29/2019 8:20 PM

## 2019-07-27 LAB — NOVEL CORONAVIRUS, NAA (HOSP ORDER, SEND-OUT TO REF LAB; TAT 18-24 HRS): SARS-CoV-2, NAA: NOT DETECTED

## 2019-07-29 NOTE — Anesthesia Preprocedure Evaluation (Addendum)
Anesthesia Evaluation  Patient identified by MRN, date of birth, ID band Patient awake    Reviewed: Allergy & Precautions, NPO status , Patient's Chart, lab work & pertinent test results  History of Anesthesia Complications Negative for: history of anesthetic complications  Airway Mallampati: III  TM Distance: >3 FB Neck ROM: Full    Dental  (+) Chipped, Missing   Pulmonary asthma , former smoker,    Pulmonary exam normal        Cardiovascular hypertension, Pt. on medications and Pt. on home beta blockers + Past MI, + Cardiac Stents and + Peripheral Vascular Disease  Normal cardiovascular exam   '20 Carotid US - 1-39% b/l ICAS. Left subclavian stent struts not seen due to body habitus. The left subclavian artery showed a monophasic waveform with a delayed acceleration suggesting a proximal stenosis with subclavian steal syndrome. The brachial artery in the right distal humerus showed a triphasic waveform with a velocity of 136/11 cm/s. The brachial artery in the left distal humerus showed a monophasic waveform with a velocity of 37/8 cm/s. The left arm blood pressure was unobtainable.  '20 TTE -  EF >65%. Left ventricular diastolic Doppler parameters are consistent with pseudonormalization. The aortic valve is bicuspid.   10/2018 Cath - Mid RCA lesion is 40% stenosed. Ost RPDA to RPDA lesion is 50% stenosed. Post Atrio lesion is 50% stenosed. Ost 1st Diag lesion is 95% stenosed. Prox LAD to Mid LAD lesion is 75% stenosed. A drug-eluting stent was successfully placed using a STENT RESOLUTE ONYX 3.0X26. Post intervention, there is a 0% residual stenosis. A drug-eluting stent was successfully placed using a STENT RESOLUTE ONYX 2.25X26. Post intervention, there is a 0% residual stenosis.  Per Dr. Ainsley Spinner note - "I did receive a message from Dr. Gwenlyn Found initially stating she needed to remain on Brilinta for 1 year but now he is  okay with her coming off Brilinta for surgery."    Neuro/Psych  Headaches, PSYCHIATRIC DISORDERS Anxiety Depression Bipolar Disorder CVA, Residual Symptoms    GI/Hepatic Neg liver ROS, hiatal hernia, PUD, GERD  Controlled and Medicated,  Endo/Other  diabetes, Poorly Controlled, Type 2, Insulin DependentHypothyroidism Morbid obesity  Renal/GU negative Renal ROS     Musculoskeletal negative musculoskeletal ROS (+)   Abdominal   Peds  Hematology  (+) Blood dyscrasia (thalassemia minor), ,   Anesthesia Other Findings Covid neg 12/24  Reproductive/Obstetrics  Fibroids                             Anesthesia Physical Anesthesia Plan  ASA: III  Anesthesia Plan: General   Post-op Pain Management:    Induction: Intravenous  PONV Risk Score and Plan: 3 and Treatment may vary due to age or medical condition, Ondansetron, Dexamethasone and Midazolam  Airway Management Planned: Oral ETT  Additional Equipment: Arterial line  Intra-op Plan:   Post-operative Plan: Extubation in OR  Informed Consent: I have reviewed the patients History and Physical, chart, labs and discussed the procedure including the risks, benefits and alternatives for the proposed anesthesia with the patient or authorized representative who has indicated his/her understanding and acceptance.     Dental advisory given  Plan Discussed with: CRNA, Anesthesiologist and Surgeon  Anesthesia Plan Comments: (Right side preference for arterial line given left subclavian stenosis  Lengthy discussion with patient and Dr. Carlis Abbott regarding BS of 390 this AM in preop and risks associated with proceeding. Dr. Carlis Abbott feels that since  patient has often had even higher blood sugars in past with long history of difficult to control DM, patient has been holding brillinta in prep for surgery, and risks of further delaying correction of her subclavian stenosis are significant, it is in her best interest  to proceed. Discussed these issues with the patient as well, who expressed understanding of the increased risks related to her hyperglycemia and also prefers to proceed with surgery. )      Anesthesia Quick Evaluation

## 2019-07-30 ENCOUNTER — Inpatient Hospital Stay (HOSPITAL_COMMUNITY)
Admission: RE | Admit: 2019-07-30 | Discharge: 2019-08-01 | DRG: 253 | Disposition: A | Discharge status: Home Health | Payer: 59 | Attending: Vascular Surgery | Admitting: Vascular Surgery

## 2019-07-30 ENCOUNTER — Inpatient Hospital Stay (HOSPITAL_COMMUNITY): Payer: 59 | Admitting: Anesthesiology

## 2019-07-30 ENCOUNTER — Other Ambulatory Visit: Payer: Self-pay

## 2019-07-30 ENCOUNTER — Encounter (HOSPITAL_COMMUNITY)
Admission: RE | Disposition: A | Discharge status: Home Health | Payer: Self-pay | Source: Home / Self Care | Attending: Vascular Surgery

## 2019-07-30 ENCOUNTER — Inpatient Hospital Stay (HOSPITAL_COMMUNITY): Payer: 59 | Admitting: Physician Assistant

## 2019-07-30 ENCOUNTER — Encounter (HOSPITAL_COMMUNITY): Payer: Self-pay | Admitting: Vascular Surgery

## 2019-07-30 DIAGNOSIS — I2510 Atherosclerotic heart disease of native coronary artery without angina pectoris: Secondary | ICD-10-CM | POA: Diagnosis present

## 2019-07-30 DIAGNOSIS — F3130 Bipolar disorder, current episode depressed, mild or moderate severity, unspecified: Secondary | ICD-10-CM | POA: Diagnosis present

## 2019-07-30 DIAGNOSIS — Z8249 Family history of ischemic heart disease and other diseases of the circulatory system: Secondary | ICD-10-CM

## 2019-07-30 DIAGNOSIS — Z955 Presence of coronary angioplasty implant and graft: Secondary | ICD-10-CM

## 2019-07-30 DIAGNOSIS — Z888 Allergy status to other drugs, medicaments and biological substances status: Secondary | ICD-10-CM

## 2019-07-30 DIAGNOSIS — Z881 Allergy status to other antibiotic agents status: Secondary | ICD-10-CM

## 2019-07-30 DIAGNOSIS — G458 Other transient cerebral ischemic attacks and related syndromes: Secondary | ICD-10-CM | POA: Diagnosis present

## 2019-07-30 DIAGNOSIS — E785 Hyperlipidemia, unspecified: Secondary | ICD-10-CM | POA: Diagnosis present

## 2019-07-30 DIAGNOSIS — E559 Vitamin D deficiency, unspecified: Secondary | ICD-10-CM | POA: Diagnosis present

## 2019-07-30 DIAGNOSIS — Z8349 Family history of other endocrine, nutritional and metabolic diseases: Secondary | ICD-10-CM | POA: Diagnosis not present

## 2019-07-30 DIAGNOSIS — I771 Stricture of artery: Secondary | ICD-10-CM | POA: Diagnosis present

## 2019-07-30 DIAGNOSIS — E039 Hypothyroidism, unspecified: Secondary | ICD-10-CM | POA: Diagnosis present

## 2019-07-30 DIAGNOSIS — Z6841 Body Mass Index (BMI) 40.0 and over, adult: Secondary | ICD-10-CM

## 2019-07-30 DIAGNOSIS — Z87891 Personal history of nicotine dependence: Secondary | ICD-10-CM | POA: Diagnosis not present

## 2019-07-30 DIAGNOSIS — K219 Gastro-esophageal reflux disease without esophagitis: Secondary | ICD-10-CM | POA: Diagnosis present

## 2019-07-30 DIAGNOSIS — Z79890 Hormone replacement therapy (postmenopausal): Secondary | ICD-10-CM

## 2019-07-30 DIAGNOSIS — Z79899 Other long term (current) drug therapy: Secondary | ICD-10-CM | POA: Diagnosis not present

## 2019-07-30 DIAGNOSIS — I10 Essential (primary) hypertension: Secondary | ICD-10-CM | POA: Diagnosis present

## 2019-07-30 DIAGNOSIS — E1151 Type 2 diabetes mellitus with diabetic peripheral angiopathy without gangrene: Secondary | ICD-10-CM | POA: Diagnosis present

## 2019-07-30 DIAGNOSIS — I252 Old myocardial infarction: Secondary | ICD-10-CM

## 2019-07-30 DIAGNOSIS — Z794 Long term (current) use of insulin: Secondary | ICD-10-CM | POA: Diagnosis not present

## 2019-07-30 DIAGNOSIS — F419 Anxiety disorder, unspecified: Secondary | ICD-10-CM | POA: Diagnosis present

## 2019-07-30 DIAGNOSIS — Z880 Allergy status to penicillin: Secondary | ICD-10-CM

## 2019-07-30 DIAGNOSIS — Z8673 Personal history of transient ischemic attack (TIA), and cerebral infarction without residual deficits: Secondary | ICD-10-CM

## 2019-07-30 DIAGNOSIS — J45909 Unspecified asthma, uncomplicated: Secondary | ICD-10-CM | POA: Diagnosis present

## 2019-07-30 DIAGNOSIS — T82856A Stenosis of peripheral vascular stent, initial encounter: Secondary | ICD-10-CM | POA: Diagnosis present

## 2019-07-30 DIAGNOSIS — Z9889 Other specified postprocedural states: Secondary | ICD-10-CM

## 2019-07-30 DIAGNOSIS — Z833 Family history of diabetes mellitus: Secondary | ICD-10-CM

## 2019-07-30 DIAGNOSIS — Z7982 Long term (current) use of aspirin: Secondary | ICD-10-CM | POA: Diagnosis not present

## 2019-07-30 DIAGNOSIS — Y712 Prosthetic and other implants, materials and accessory cardiovascular devices associated with adverse incidents: Secondary | ICD-10-CM | POA: Diagnosis present

## 2019-07-30 DIAGNOSIS — Z885 Allergy status to narcotic agent status: Secondary | ICD-10-CM

## 2019-07-30 HISTORY — PX: CAROTID-SUBCLAVIAN BYPASS GRAFT: SHX910

## 2019-07-30 LAB — POCT I-STAT, CHEM 8
BUN: 14 mg/dL (ref 6–20)
BUN: 13 mg/dL (ref 6–20)
BUN: 13 mg/dL (ref 6–20)
Calcium, Ion: 1.22 mmol/L (ref 1.15–1.40)
Calcium, Ion: 1.2 mmol/L (ref 1.15–1.40)
Calcium, Ion: 1.19 mmol/L (ref 1.15–1.40)
Chloride: 100 mmol/L (ref 98–111)
Chloride: 101 mmol/L (ref 98–111)
Chloride: 102 mmol/L (ref 98–111)
Creatinine, Ser: 0.6 mg/dL (ref 0.44–1.00)
Creatinine, Ser: 0.6 mg/dL (ref 0.44–1.00)
Creatinine, Ser: 0.6 mg/dL (ref 0.44–1.00)
Glucose, Bld: 376 mg/dL — ABNORMAL HIGH (ref 70–99)
Glucose, Bld: 312 mg/dL — ABNORMAL HIGH (ref 70–99)
Glucose, Bld: 264 mg/dL — ABNORMAL HIGH (ref 70–99)
HCT: 35 % — ABNORMAL LOW (ref 36.0–46.0)
HCT: 33 % — ABNORMAL LOW (ref 36.0–46.0)
HCT: 36 % (ref 36.0–46.0)
Hemoglobin: 11.9 g/dL — ABNORMAL LOW (ref 12.0–15.0)
Hemoglobin: 11.2 g/dL — ABNORMAL LOW (ref 12.0–15.0)
Hemoglobin: 12.2 g/dL (ref 12.0–15.0)
Potassium: 3.9 mmol/L (ref 3.5–5.1)
Potassium: 3.8 mmol/L (ref 3.5–5.1)
Potassium: 3.8 mmol/L (ref 3.5–5.1)
Sodium: 134 mmol/L — ABNORMAL LOW (ref 135–145)
Sodium: 135 mmol/L (ref 135–145)
Sodium: 136 mmol/L (ref 135–145)
TCO2: 25 mmol/L (ref 22–32)
TCO2: 23 mmol/L (ref 22–32)
TCO2: 22 mmol/L (ref 22–32)

## 2019-07-30 LAB — POCT ACTIVATED CLOTTING TIME
Activated Clotting Time: 213 seconds
Activated Clotting Time: 235 seconds
Activated Clotting Time: 241 seconds

## 2019-07-30 LAB — TYPE AND SCREEN
ABO/RH(D): O POS
Antibody Screen: POSITIVE

## 2019-07-30 LAB — GLUCOSE, CAPILLARY
Glucose-Capillary: 390 mg/dL — ABNORMAL HIGH (ref 70–99)
Glucose-Capillary: 215 mg/dL — ABNORMAL HIGH (ref 70–99)
Glucose-Capillary: 262 mg/dL — ABNORMAL HIGH (ref 70–99)
Glucose-Capillary: 329 mg/dL — ABNORMAL HIGH (ref 70–99)
Glucose-Capillary: 309 mg/dL — ABNORMAL HIGH (ref 70–99)

## 2019-07-30 LAB — HEMOGLOBIN A1C
Hgb A1c MFr Bld: 14.2 % — ABNORMAL HIGH (ref 4.8–5.6)
Mean Plasma Glucose: 360.84 mg/dL

## 2019-07-30 SURGERY — CREATION, BYPASS, ARTERIAL, SUBCLAVIAN TO CAROTID, USING GRAFT
Anesthesia: General | Site: Neck | Laterality: Left

## 2019-07-30 MED ORDER — LABETALOL HCL 5 MG/ML IV SOLN
10.0000 mg | INTRAVENOUS | Status: DC | PRN
  Administered 2019-07-30: 10 mg via INTRAVENOUS
  Filled 2019-07-30: qty 4

## 2019-07-30 MED ORDER — ALBUTEROL SULFATE (2.5 MG/3ML) 0.083% IN NEBU: 2.5000 mg | INHALATION_SOLUTION | Freq: Two times a day (BID) | RESPIRATORY_TRACT | Status: DC | PRN

## 2019-07-30 MED ORDER — INSULIN ASPART 100 UNIT/ML ~~LOC~~ SOLN: 0.0000 [IU] | Freq: Three times a day (TID) | SUBCUTANEOUS | Status: DC

## 2019-07-30 MED ORDER — SENNOSIDES-DOCUSATE SODIUM 8.6-50 MG PO TABS: 1.0000 | ORAL_TABLET | Freq: Every evening | ORAL | Status: DC | PRN

## 2019-07-30 MED ORDER — ACETAMINOPHEN 325 MG PO TABS
325.0000 mg | ORAL_TABLET | ORAL | Status: DC | PRN
  Administered 2019-08-01: 650 mg via ORAL
  Filled 2019-07-30: qty 2

## 2019-07-30 MED ORDER — METOPROLOL TARTRATE 5 MG/5ML IV SOLN: 2.0000 mg | INTRAVENOUS | Status: DC | PRN

## 2019-07-30 MED ORDER — LOSARTAN POTASSIUM 50 MG PO TABS
100.0000 mg | ORAL_TABLET | Freq: Every day | ORAL | Status: DC
  Administered 2019-07-30 – 2019-08-01 (×3): 100 mg via ORAL
  Filled 2019-07-30 (×3): qty 2

## 2019-07-30 MED ORDER — MOMETASONE FURO-FORMOTEROL FUM 200-5 MCG/ACT IN AERO
2.0000 | INHALATION_SPRAY | Freq: Two times a day (BID) | RESPIRATORY_TRACT | Status: DC
  Administered 2019-07-30 – 2019-08-01 (×4): 2 via RESPIRATORY_TRACT
  Filled 2019-07-30: qty 8.8

## 2019-07-30 MED ORDER — 0.9 % SODIUM CHLORIDE (POUR BTL) OPTIME
TOPICAL | Status: DC | PRN
  Administered 2019-07-30: 2000 mL

## 2019-07-30 MED ORDER — PROTAMINE SULFATE 10 MG/ML IV SOLN
INTRAVENOUS | Status: DC | PRN
  Administered 2019-07-30: 50 mg via INTRAVENOUS

## 2019-07-30 MED ORDER — LEVOTHYROXINE SODIUM 75 MCG PO TABS
150.0000 ug | ORAL_TABLET | Freq: Every day | ORAL | Status: DC
  Administered 2019-07-30 – 2019-07-31 (×2): 150 ug via ORAL
  Filled 2019-07-30 (×2): qty 2

## 2019-07-30 MED ORDER — MIDAZOLAM HCL 5 MG/5ML IJ SOLN
INTRAMUSCULAR | Status: DC | PRN
  Administered 2019-07-30: 2 mg via INTRAVENOUS

## 2019-07-30 MED ORDER — PHENOL 1.4 % MT LIQD: 1.0000 | OROMUCOSAL | Status: DC | PRN

## 2019-07-30 MED ORDER — INSULIN ASPART 100 UNIT/ML ~~LOC~~ SOLN
SUBCUTANEOUS | Status: AC
  Administered 2019-07-30: 10 [IU] via SUBCUTANEOUS
  Filled 2019-07-30: qty 1

## 2019-07-30 MED ORDER — PHENYLEPHRINE HCL-NACL 10-0.9 MG/250ML-% IV SOLN
INTRAVENOUS | Status: DC | PRN
  Administered 2019-07-30: 50 ug/min via INTRAVENOUS

## 2019-07-30 MED ORDER — ONDANSETRON HCL 4 MG/2ML IJ SOLN
INTRAMUSCULAR | Status: DC | PRN
  Administered 2019-07-30: 4 mg via INTRAVENOUS

## 2019-07-30 MED ORDER — INSULIN ASPART 100 UNIT/ML ~~LOC~~ SOLN
0.0000 [IU] | Freq: Three times a day (TID) | SUBCUTANEOUS | Status: DC
  Administered 2019-07-30: 5 [IU] via SUBCUTANEOUS
  Administered 2019-07-30: 8 [IU] via SUBCUTANEOUS

## 2019-07-30 MED ORDER — CHLORHEXIDINE GLUCONATE 4 % EX LIQD: 60.0000 mL | Freq: Once | CUTANEOUS | Status: DC

## 2019-07-30 MED ORDER — HYDRALAZINE HCL 20 MG/ML IJ SOLN
5.0000 mg | INTRAMUSCULAR | Status: DC | PRN
  Filled 2019-07-30: qty 1

## 2019-07-30 MED ORDER — HEPARIN SODIUM (PORCINE) 1000 UNIT/ML IJ SOLN
INTRAMUSCULAR | Status: AC
  Filled 2019-07-30: qty 2

## 2019-07-30 MED ORDER — LIDOCAINE HCL (PF) 1 % IJ SOLN
INTRAMUSCULAR | Status: AC
  Filled 2019-07-30: qty 5

## 2019-07-30 MED ORDER — EPINEPHRINE 0.3 MG/0.3ML IJ SOAJ
0.3000 mg | Freq: Once | INTRAMUSCULAR | Status: DC | PRN
  Filled 2019-07-30: qty 0.6

## 2019-07-30 MED ORDER — METOPROLOL TARTRATE 50 MG PO TABS
50.0000 mg | ORAL_TABLET | Freq: Two times a day (BID) | ORAL | Status: DC
  Administered 2019-07-30 – 2019-08-01 (×5): 50 mg via ORAL
  Filled 2019-07-30 (×5): qty 1

## 2019-07-30 MED ORDER — HEMOSTATIC AGENTS (NO CHARGE) OPTIME
TOPICAL | Status: DC | PRN
  Administered 2019-07-30: 1 via TOPICAL

## 2019-07-30 MED ORDER — ACETAMINOPHEN 325 MG RE SUPP: 325.0000 mg | RECTAL | Status: DC | PRN

## 2019-07-30 MED ORDER — LAMOTRIGINE 100 MG PO TABS
200.0000 mg | ORAL_TABLET | Freq: Every day | ORAL | Status: DC
  Administered 2019-07-30 – 2019-08-01 (×3): 200 mg via ORAL
  Filled 2019-07-30 (×3): qty 2
  Filled 2019-07-30: qty 8

## 2019-07-30 MED ORDER — FENTANYL CITRATE (PF) 100 MCG/2ML IJ SOLN: 25.0000 ug | INTRAMUSCULAR | Status: DC | PRN

## 2019-07-30 MED ORDER — ONDANSETRON HCL 4 MG/2ML IJ SOLN: 4.0000 mg | Freq: Once | INTRAMUSCULAR | Status: DC | PRN

## 2019-07-30 MED ORDER — SODIUM CHLORIDE 0.9 % IV SOLN: INTRAVENOUS | Status: DC

## 2019-07-30 MED ORDER — FENTANYL CITRATE (PF) 250 MCG/5ML IJ SOLN
INTRAMUSCULAR | Status: AC
  Filled 2019-07-30: qty 5

## 2019-07-30 MED ORDER — ASPIRIN 81 MG PO CHEW
81.0000 mg | CHEWABLE_TABLET | Freq: Every day | ORAL | Status: DC
  Administered 2019-07-30 – 2019-08-01 (×3): 81 mg via ORAL
  Filled 2019-07-30 (×3): qty 1

## 2019-07-30 MED ORDER — SODIUM CHLORIDE 0.9 % IV SOLN
INTRAVENOUS | Status: DC | PRN
  Administered 2019-07-30: 500 mL

## 2019-07-30 MED ORDER — INSULIN ASPART 100 UNIT/ML ~~LOC~~ SOLN: 10.0000 [IU] | Freq: Once | SUBCUTANEOUS | Status: AC

## 2019-07-30 MED ORDER — LIDOCAINE 2% (20 MG/ML) 5 ML SYRINGE
INTRAMUSCULAR | Status: AC
  Filled 2019-07-30: qty 5

## 2019-07-30 MED ORDER — TOPIRAMATE 25 MG PO TABS
50.0000 mg | ORAL_TABLET | Freq: Every day | ORAL | Status: DC
  Administered 2019-07-30 – 2019-07-31 (×2): 50 mg via ORAL
  Filled 2019-07-30 (×2): qty 2

## 2019-07-30 MED ORDER — MIDAZOLAM HCL 2 MG/2ML IJ SOLN
INTRAMUSCULAR | Status: AC
  Filled 2019-07-30: qty 2

## 2019-07-30 MED ORDER — PROPOFOL 10 MG/ML IV BOLUS
INTRAVENOUS | Status: AC
  Filled 2019-07-30: qty 20

## 2019-07-30 MED ORDER — ALUM & MAG HYDROXIDE-SIMETH 200-200-20 MG/5ML PO SUSP: 15.0000 mL | ORAL | Status: DC | PRN

## 2019-07-30 MED ORDER — LIDOCAINE 2% (20 MG/ML) 5 ML SYRINGE
INTRAMUSCULAR | Status: DC | PRN
  Administered 2019-07-30: 60 mg via INTRAVENOUS

## 2019-07-30 MED ORDER — ROCURONIUM BROMIDE 50 MG/5ML IV SOSY
PREFILLED_SYRINGE | INTRAVENOUS | Status: DC | PRN
  Administered 2019-07-30: 60 mg via INTRAVENOUS
  Administered 2019-07-30 (×2): 10 mg via INTRAVENOUS
  Administered 2019-07-30: 20 mg via INTRAVENOUS

## 2019-07-30 MED ORDER — PROTAMINE SULFATE 10 MG/ML IV SOLN: INTRAVENOUS | Status: DC | PRN

## 2019-07-30 MED ORDER — SUGAMMADEX SODIUM 200 MG/2ML IV SOLN
INTRAVENOUS | Status: DC | PRN
  Administered 2019-07-30: 250 mg via INTRAVENOUS

## 2019-07-30 MED ORDER — NITROGLYCERIN 0.4 MG SL SUBL: 0.4000 mg | SUBLINGUAL_TABLET | SUBLINGUAL | Status: DC | PRN

## 2019-07-30 MED ORDER — SODIUM CHLORIDE 0.9 % IV SOLN: 500.0000 mL | Freq: Once | INTRAVENOUS | Status: DC | PRN

## 2019-07-30 MED ORDER — PROPOFOL 10 MG/ML IV BOLUS
INTRAVENOUS | Status: DC | PRN
  Administered 2019-07-30: 200 mg via INTRAVENOUS

## 2019-07-30 MED ORDER — BISACODYL 5 MG PO TBEC: 5.0000 mg | DELAYED_RELEASE_TABLET | Freq: Every day | ORAL | Status: DC | PRN

## 2019-07-30 MED ORDER — INSULIN REGULAR(HUMAN) IN NACL 100-0.9 UT/100ML-% IV SOLN
INTRAVENOUS | Status: DC
  Administered 2019-07-30: 8 [IU]/h via INTRAVENOUS
  Filled 2019-07-30: qty 100

## 2019-07-30 MED ORDER — INSULIN ASPART 100 UNIT/ML ~~LOC~~ SOLN
0.0000 [IU] | Freq: Three times a day (TID) | SUBCUTANEOUS | Status: DC
  Administered 2019-07-31: 07:00:00 5 [IU] via SUBCUTANEOUS
  Administered 2019-07-31: 7 [IU] via SUBCUTANEOUS
  Administered 2019-07-31: 5 [IU] via SUBCUTANEOUS
  Administered 2019-08-01 (×2): 9 [IU] via SUBCUTANEOUS

## 2019-07-30 MED ORDER — PHENYLEPHRINE 40 MCG/ML (10ML) SYRINGE FOR IV PUSH (FOR BLOOD PRESSURE SUPPORT)
PREFILLED_SYRINGE | INTRAVENOUS | Status: AC
  Filled 2019-07-30: qty 10

## 2019-07-30 MED ORDER — VANCOMYCIN HCL IN DEXTROSE 1-5 GM/200ML-% IV SOLN
1000.0000 mg | Freq: Two times a day (BID) | INTRAVENOUS | Status: AC
  Administered 2019-07-30 – 2019-07-31 (×2): 1000 mg via INTRAVENOUS
  Filled 2019-07-30 (×3): qty 200

## 2019-07-30 MED ORDER — HYDROCODONE-ACETAMINOPHEN 5-325 MG PO TABS
1.0000 | ORAL_TABLET | ORAL | Status: DC | PRN
  Administered 2019-07-30 – 2019-08-01 (×5): 1 via ORAL
  Filled 2019-07-30 (×5): qty 1

## 2019-07-30 MED ORDER — HUMALOG KWIKPEN 200 UNIT/ML ~~LOC~~ SOPN: 25.0000 [IU] | PEN_INJECTOR | Freq: Three times a day (TID) | SUBCUTANEOUS | 11 refills | Status: DC

## 2019-07-30 MED ORDER — ONDANSETRON HCL 4 MG/2ML IJ SOLN
INTRAMUSCULAR | Status: AC
  Filled 2019-07-30: qty 2

## 2019-07-30 MED ORDER — ROCURONIUM BROMIDE 10 MG/ML (PF) SYRINGE
PREFILLED_SYRINGE | INTRAVENOUS | Status: AC
  Filled 2019-07-30: qty 10

## 2019-07-30 MED ORDER — DOCUSATE SODIUM 100 MG PO CAPS
100.0000 mg | ORAL_CAPSULE | Freq: Every day | ORAL | Status: DC
  Administered 2019-07-31: 09:00:00 100 mg via ORAL
  Filled 2019-07-30: qty 1

## 2019-07-30 MED ORDER — LACTATED RINGERS IV SOLN: INTRAVENOUS | Status: DC

## 2019-07-30 MED ORDER — POTASSIUM CHLORIDE CRYS ER 20 MEQ PO TBCR: 20.0000 meq | EXTENDED_RELEASE_TABLET | Freq: Every day | ORAL | Status: DC | PRN

## 2019-07-30 MED ORDER — FENTANYL CITRATE (PF) 100 MCG/2ML IJ SOLN
INTRAMUSCULAR | Status: DC | PRN
  Administered 2019-07-30: 150 ug via INTRAVENOUS
  Administered 2019-07-30 (×2): 50 ug via INTRAVENOUS

## 2019-07-30 MED ORDER — SODIUM CHLORIDE 0.9 % IV SOLN
INTRAVENOUS | Status: AC
  Filled 2019-07-30: qty 1.2

## 2019-07-30 MED ORDER — MAGNESIUM SULFATE 2 GM/50ML IV SOLN: 2.0000 g | Freq: Every day | INTRAVENOUS | Status: DC | PRN

## 2019-07-30 MED ORDER — HEPARIN SODIUM (PORCINE) 1000 UNIT/ML IJ SOLN
INTRAMUSCULAR | Status: DC | PRN
  Administered 2019-07-30: 3000 [IU] via INTRAVENOUS
  Administered 2019-07-30: 10000 [IU] via INTRAVENOUS
  Administered 2019-07-30: 2000 [IU] via INTRAVENOUS

## 2019-07-30 MED ORDER — INSULIN ASPART 100 UNIT/ML ~~LOC~~ SOLN
0.0000 [IU] | Freq: Every day | SUBCUTANEOUS | Status: DC
  Administered 2019-07-30: 4 [IU] via SUBCUTANEOUS
  Administered 2019-07-31: 5 [IU] via SUBCUTANEOUS

## 2019-07-30 MED ORDER — ATORVASTATIN CALCIUM 80 MG PO TABS
80.0000 mg | ORAL_TABLET | Freq: Every day | ORAL | Status: DC
  Administered 2019-07-30 – 2019-07-31 (×2): 80 mg via ORAL
  Filled 2019-07-30 (×2): qty 1

## 2019-07-30 MED ORDER — GUAIFENESIN-DM 100-10 MG/5ML PO SYRP: 15.0000 mL | ORAL_SOLUTION | ORAL | Status: DC | PRN

## 2019-07-30 MED ORDER — HYDROMORPHONE HCL 1 MG/ML IJ SOLN: 0.5000 mg | INTRAMUSCULAR | Status: DC | PRN

## 2019-07-30 SURGICAL SUPPLY — 51 items
CANISTER SUCT 3000ML PPV (MISCELLANEOUS) ×3 IMPLANT
CATH ROBINSON RED A/P 18FR (CATHETERS) ×3 IMPLANT
CLIP VESOCCLUDE MED 24/CT (CLIP) ×6 IMPLANT
CLIP VESOCCLUDE SM WIDE 24/CT (CLIP) ×6 IMPLANT
COVER WAND RF STERILE (DRAPES) IMPLANT
DERMABOND ADVANCED (GAUZE/BANDAGES/DRESSINGS) ×2
DERMABOND ADVANCED .7 DNX12 (GAUZE/BANDAGES/DRESSINGS) ×1 IMPLANT
DRAIN CHANNEL 15F RND FF W/TCR (WOUND CARE) IMPLANT
DRAIN JP 10F RND SILICONE (MISCELLANEOUS) ×3 IMPLANT
ELECT CAUTERY BLADE 6.4 (BLADE) ×3 IMPLANT
ELECT REM PT RETURN 9FT ADLT (ELECTROSURGICAL) ×3
ELECTRODE REM PT RTRN 9FT ADLT (ELECTROSURGICAL) ×1 IMPLANT
EVACUATOR SILICONE 100CC (DRAIN) ×3 IMPLANT
GLOVE BIO SURGEON STRL SZ7.5 (GLOVE) ×3 IMPLANT
GLOVE BIOGEL PI IND STRL 8 (GLOVE) ×3 IMPLANT
GLOVE BIOGEL PI INDICATOR 8 (GLOVE) ×6
GLOVE ECLIPSE 6.5 STRL STRAW (GLOVE) ×3 IMPLANT
GLOVE ECLIPSE 8.0 STRL XLNG CF (GLOVE) ×6 IMPLANT
GOWN SPEC L3 XXLG W/TWL (GOWN DISPOSABLE) ×6 IMPLANT
GOWN STRL REUS W/ TWL LRG LVL3 (GOWN DISPOSABLE) ×1 IMPLANT
GOWN STRL REUS W/ TWL XL LVL3 (GOWN DISPOSABLE) ×1 IMPLANT
GOWN STRL REUS W/TWL LRG LVL3 (GOWN DISPOSABLE) ×2
GOWN STRL REUS W/TWL XL LVL3 (GOWN DISPOSABLE) ×2
GRAFT CV STRG 30X7KNIT SFT (Vascular Products) ×1 IMPLANT
GRAFT HEMASHIELD 7MM (Vascular Products) ×2 IMPLANT
HEMOSTAT SNOW SURGICEL 2X4 (HEMOSTASIS) ×3 IMPLANT
HEMOSTAT SPONGE AVITENE ULTRA (HEMOSTASIS) IMPLANT
INSERT FOGARTY SM (MISCELLANEOUS) ×6 IMPLANT
KIT BASIN OR (CUSTOM PROCEDURE TRAY) ×3 IMPLANT
KIT TURNOVER KIT B (KITS) ×3 IMPLANT
LOOP VESSEL MAXI BLUE (MISCELLANEOUS) ×3 IMPLANT
NS IRRIG 1000ML POUR BTL (IV SOLUTION) ×6 IMPLANT
PACK CAROTID (CUSTOM PROCEDURE TRAY) ×3 IMPLANT
PAD ARMBOARD 7.5X6 YLW CONV (MISCELLANEOUS) ×6 IMPLANT
POSITIONER HEAD DONUT 9IN (MISCELLANEOUS) ×3 IMPLANT
SUT ETHILON 2 0 FS 18 (SUTURE) ×3 IMPLANT
SUT ETHILON 3 0 PS 1 (SUTURE) IMPLANT
SUT MNCRL AB 4-0 PS2 18 (SUTURE) ×3 IMPLANT
SUT PROLENE 5 0 C 1 24 (SUTURE) ×9 IMPLANT
SUT PROLENE 6 0 BV (SUTURE) ×6 IMPLANT
SUT PROLENE 6 0 CC (SUTURE) IMPLANT
SUT PROLENE 7 0 BV 1 (SUTURE) IMPLANT
SUT SILK 3 0 (SUTURE) ×2
SUT SILK 3-0 18XBRD TIE 12 (SUTURE) ×1 IMPLANT
SUT VIC AB 2-0 CT1 27 (SUTURE) ×2
SUT VIC AB 2-0 CT1 TAPERPNT 27 (SUTURE) ×1 IMPLANT
SUT VIC AB 3-0 SH 27 (SUTURE) ×4
SUT VIC AB 3-0 SH 27XBRD (SUTURE) ×2 IMPLANT
SYR TB 1ML LUER SLIP (SYRINGE) IMPLANT
TOWEL GREEN STERILE (TOWEL DISPOSABLE) ×3 IMPLANT
WATER STERILE IRR 1000ML POUR (IV SOLUTION) ×3 IMPLANT

## 2019-07-30 NOTE — Progress Notes (Signed)
Patient c/o of SOB requested for her inhalers,[place] MD notified, verbal order received for Advair inhaler 2 puff BID, ventolin inhaler 2 puff BID PRN. Advair inhaler not available pharmacy staff advised that the patient can have Dulera inhaler  instead, Dulera inhaler 2 puffs BID ordered will continue to monitor.

## 2019-07-30 NOTE — Progress Notes (Signed)
Elevated CBG, Dr. Fransisco Beau made aware. Verbal orders received, will continue to monitor.

## 2019-07-30 NOTE — H&P (Signed)
History and Physical Interval Note:  07/30/2019 7:07 AM  Morgan [name]  has presented today for surgery, with the diagnosis of LEFT SUBCLAVIAN STENOSIS.  The various methods of treatment have been discussed with the patient and family. After consideration of risks, benefits and other options for treatment, the patient has consented to  Procedure(s): BYPASS GRAFT LEFT CAROTID-SUBCLAVIAN (Left) as a surgical intervention.  The patient's history has been reviewed, patient examined, no change in status, stable for surgery.  I have reviewed the patient's chart and labs.  Questions were answered to the patient's satisfaction.    Left carotid subclavian bypass  Morgan [name]  Patient name: Morgan [name] MRN: [id number] DOB: 11-08-1965 Sex: female  REASON FOR VISIT: Left subclavian steal, occluded left subclavian stent  HPI:  Morgan [name] is a 53 y.o. female with history of hypertension, hyperlipidemia, diabetes, coronary artery disease status post NSTEMI 10/27/2018 with drug-eluting stents that vascular surgery was recently consulted for left subclavian stent occlusion and left subclavian steal syndrome by Dr. Gwenlyn Found. Patient reportedly had a stent placed about 10 years ago at [facility]. Dr. Gwenlyn Found reports that she has been having increasing left upper extremity claudication over the last year. She states that she has trouble typing at times and makes a lot of errors with her left hand. 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 She also endorses some dizziness. Also reports having some trouble holding a cup with her left hand.  She is anxious to proceed with surgery given her insurance is going to change at the beginning of the year. I did receive a message from Dr. Gwenlyn Found initially stating she needed to remain on Brilinta for 1 year but now he is okay with her coming off Brilinta for surgery. Still having same issues with her left arm. No head or neck surgery other than tonsillectomy.       Past Medical History:  Diagnosis Date  . Anxiety   . Asthma   . Bipolar depression ([place])   . Chronic bronchitis ([place])   . Chronic esophagogastric ulcer   . Chronic stomach ulcer   . Fibroid   . GERD (gastroesophageal reflux disease)   . Heart murmur   . Hepatic steatosis   . History of blood transfusion 2010   "related to subclavian stent"  . History of hiatal hernia   . Hyperlipidemia   . Hypertension   . Hypothyroid   . Migraine    "cerebral migraines; 1-2/month" (06/29/2018)  . Non-ST elevation (NSTEMI) myocardial infarction ([place]) 10/27/2018  . 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 Stroke [facility])    "I've had 2; most recent one was ~ 2015 or before; affected balance" (06/29/2018)  . Thalassemia minor   . Type II diabetes mellitus ([place])   . Vitamin D deficiency         Past Surgical History:  Procedure Laterality Date  . ABDOMINAL HYSTERECTOMY  2015   "still have 1 ovary"  . AORTIC ARCH ANGIOGRAPHY  06/14/2019  . AORTIC ARCH ANGIOGRAPHY N/A 06/14/2019   Procedure: AORTIC ARCH ANGIOGRAPHY; Surgeon: Lorretta Harp, MD; Location: [place] CV LAB; Service: Cardiovascular; Laterality: N/A;  . BIOPSY  07/25/2018   Procedure: BIOPSY; Surgeon: [place] Park, MD; Location: [facility] ENDOSCOPY; Service: Gastroenterology;;  . [place]; 1996  . CORONARY STENT INTERVENTION N/A 10/27/2018   Procedure: CORONARY STENT INTERVENTION; Surgeon: Sherren Mocha, MD; Location: [place]  CV LAB; Service: Cardiovascular; Laterality: N/A;  . ESOPHAGOGASTRODUODENOSCOPY (EGD) WITH PROPOFOL N/A 07/25/2018   Procedure: ESOPHAGOGASTRODUODENOSCOPY (EGD) WITH PROPOFOL; Surgeon: Thornton Park, MD; Location: [place]; Service: Gastroenterology; Laterality: N/A;  . KNEE RECONSTRUCTION Left 1980  . LAPAROSCOPIC CHOLECYSTECTOMY    . LEFT HEART CATH AND CORONARY ANGIOGRAPHY N/A 10/27/2018   Procedure: LEFT HEART CATH AND  CORONARY ANGIOGRAPHY; Surgeon: Sherren Mocha, MD; Location: [place] CV LAB; Service: Cardiovascular; Laterality: N/A;  . SUBCLAVIAN STENT PLACEMENT Left 2010  . TONSILLECTOMY  1970  . UPPER EXTREMITY ANGIOGRAPHY Right 06/14/2019   Procedure: UPPER EXTREMITY ANGIOGRAPHY; Surgeon: Lorretta Harp, MD; Location: [place] CV LAB; Service: Cardiovascular; Laterality: Right; upper ext        Family History  Problem Relation Age of Onset  . Diabetes Mother   . Hypertension Mother   . Hyperlipidemia Mother   . CAD Mother   . Cervical cancer Mother   . CAD Father   . Heart failure Father   . Testicular cancer Father   . Hypertension Sister   . Hyperlipidemia Sister   . Hypertension Brother   . Hyperlipidemia Brother    SOCIAL HISTORY:  Social History        Tobacco Use  . Smoking status: Former Smoker    Packs/day: 2.00    Years: 36.00    Pack years: 72.00    Types: Cigarettes    Quit date: 05/14/2014    Years since quitting: 5.1  . Smokeless tobacco: Never Used  Substance Use Topics  . Alcohol use: Not Currently        Allergies  Allergen Reactions  . Ilosone [Erythromycin] Anaphylaxis  . Keflex [Cephalexin] Anaphylaxis  . Penicillins Anaphylaxis    Has patient had a PCN reaction causing immediate rash, facial/tongue/throat swelling, SOB or lightheadedness with hypotension: YES  Has patient had a PCN reaction causing severe rash involving mucus membranes or skin necrosis: NO  Has patient had a PCN reaction that required hospitalizationNO  Has patient had a PCN reaction occurring within the last 10 years: NO  If all of the above answers are "NO", then may proceed with Cephalosporin use.  . Iron Other (See Comments)    Pt reports condition limiting Iron intake.  . Metformin And Related Other (See Comments)    Pt prefers to not take this medication  Side effects  . Morphine And Related Other (See Comments)    Pt reports hallucinations         Current  Outpatient Medications  Medication Sig Dispense Refill  . Accu-Chek FastClix Lancets MISC 1 Package by Does not apply route 3 (three) times daily. 150 each 3  . albuterol (VENTOLIN HFA) 108 (90 Base) MCG/ACT inhaler Inhale 2 puffs into the lungs 2 (two) times daily as needed for wheezing or shortness of breath.    [name] aspirin 81 MG tablet Take 1 tablet (81 mg total) by mouth daily. 30 tablet 1  . atorvastatin (LIPITOR) 80 MG tablet Take 80 mg by mouth at bedtime.    . Blood Glucose Monitoring Suppl (CONTOUR NEXT ONE) KIT 1 kit by Does not apply route as directed. 1 kit 0  . Cholecalciferol (VITAMIN D3) 50 MCG (2000 UT) TABS Take 2,000-4,000 Units by mouth See admin instructions. Take 2 tablets by mouth once a day on Mon/Wed/Fri and take 1 tablet on Sun/Tues/Thurs/Sat    . EPINEPHrine 0.3 mg/0.3 mL IJ SOAJ injection Inject 0.3 mg into the muscle once as needed for anaphylaxis (for an  anaphylactic reaction).   1  . FLUoxetine (PROZAC) 20 MG tablet Take 40 mg by mouth daily.    . fluticasone-salmeterol (ADVAIR HFA) 115-21 MCG/ACT inhaler Inhale 2 puffs into the lungs 2 (two) times daily.    . furosemide (LASIX) 20 MG tablet Take 1 tablet (20 mg total) by mouth 2 (two) times daily. 180 tablet 3  . GLUCAGON EMERGENCY 1 MG injection Inject 1 mg into the vein once as needed (FOR ONSET OF HYPOGLYCEMIA).   2  . glucose blood (CONTOUR NEXT TEST) test strip Use as instructed 400 each 12  . HYDROcodone-acetaminophen (NORCO) 5-325 MG tablet Take 1 tablet by mouth every 4 (four) hours as needed for severe pain. 15 tablet 0  . Insulin Glargine (LANTUS SOLOSTAR) 100 UNIT/ML Solostar Pen Inject 45 Units into the skin daily.    . insulin lispro (HUMALOG KWIKPEN) 100 UNIT/ML KwikPen Inject 0.05 mLs (5 Units total) into the skin 3 (three) times daily. Inject 5 units under the skin 3 times daily before meals. (Patient taking differently: Inject 10 Units into the skin 3 (three) times daily. Inject 5 units under the skin 3  times daily before meals. ) 15 mL 6  . Insulin Pen Needle 31G X 5 MM MISC 1 each by Does not apply route 3 (three) times daily. 270 each 2  . lamoTRIgine (LAMICTAL) 200 MG tablet Take 200 mg by mouth daily.     [name] levothyroxine (SYNTHROID, LEVOTHROID) 150 MCG tablet Take 150 mcg by mouth at bedtime.     [name] losartan (COZAAR) 100 MG tablet Take 1 tablet (100 mg total) by mouth daily. 90 tablet 3  . Lurasidone HCl (LATUDA) 60 MG TABS Take 60 mg by mouth at bedtime.     . metoprolol tartrate (LOPRESSOR) 50 MG tablet Take 1 tablet (50 mg total) by mouth 2 (two) times daily. 180 tablet 3  . Multiple Vitamin (MULTIVITAMIN WITH MINERALS) TABS tablet Take 1 tablet by mouth daily.    . nitroGLYCERIN (NITROSTAT) 0.4 MG SL tablet Place 1 tablet (0.4 mg total) under the tongue every 5 (five) minutes x 3 doses as needed for chest pain. 30 tablet 12  . nystatin (MYCOSTATIN/NYSTOP) powder Apply topically as needed. (Patient taking differently: Apply 1 g topically daily as needed (infection). ) 15 g 0  . nystatin ointment (MYCOSTATIN) Apply 1 application topically daily as needed (infection).    . pantoprazole (PROTONIX) 40 MG tablet Take 1 tablet (40 mg total) by mouth 2 (two) times daily before a meal. X 8 weeks (Patient taking differently: Take 40 mg by mouth daily. Additional 20 mg as needed) 60 tablet 3  . potassium chloride SA (KLOR-CON) 20 MEQ tablet Take 1 tablet (20 mEq total) by mouth 2 (two) times daily. 10 tablet 0  . ticagrelor (BRILINTA) 90 MG TABS tablet Take 1 tablet (90 mg total) by mouth 2 (two) times daily. 180 tablet 3  . topiramate (TOPAMAX) 50 MG tablet Take 1 tablet (50 mg total) by mouth at bedtime. 30 tablet 1  . butalbital-acetaminophen-caffeine (FIORICET, ESGIC) 50-325-40 MG tablet Take 1-2 tablets by mouth every 6 (six) hours as needed for headache or migraine. (Patient not taking: Reported on 07/17/2019) 30 tablet 0   No current facility-administered medications for this visit.   REVIEW  OF SYSTEMS:  [X] denotes positive finding, [ ] denotes negative finding  Cardiac  Comments:  Chest pain or chest pressure:    Shortness of breath upon exertion:    Short of  breath when lying flat:    Irregular heart rhythm:        Vascular    Pain in calf, thigh, or hip brought on by ambulation:    Pain in feet at night that wakes you up from your sleep:     Blood clot in your veins:    Leg swelling:         Pulmonary    Oxygen at home:    Productive cough:     Wheezing:         Neurologic    Sudden weakness in arms or legs:     Sudden numbness in arms or legs:     Sudden onset of difficulty speaking or slurred speech:    Temporary loss of vision in one eye:     Problems with dizziness:  x       Gastrointestinal    Blood in stool:     Vomited blood:         Genitourinary    Burning when urinating:     Blood in urine:        Psychiatric    Major depression:         Hematologic    Bleeding problems:    Problems with blood clotting too easily:        Skin    Rashes or ulcers:        Constitutional    Fever or chills:    PHYSICAL EXAM:      Vitals:   07/17/19 0810 07/17/19 0818  BP: 126/74 90/62  Pulse: 76 76  Resp: 16   Temp: (!) 97 F (36.1 C)   TempSrc: Temporal   SpO2: 98%   Weight: 220 lb (99.8 kg)   Height: 5' 3" (1.6 m)    GENERAL: The patient is a well-nourished female, in no acute distress. The vital signs are documented above.  CARDIAC: There is a regular rate and rhythm.  VASCULAR:  Right radial artery 2+ palpable, left radial artery nonpalpable  Neuro: Cranial nerves II through XII grossly intact, no appreciable deficits  DATA:  Carotid duplex 04/17/2019: No significant ICA or carotid disease. Left vertebral artery has retrograde flow.  Arch aortogram by Dr. Gwenlyn Found: On my review the left carotid is free of disease. The left vertebral artery is free of disease. The left subclavian stent is occluded.  Assessment/Plan:  53 year old female with  occluded left subclavian stent that was placed about 10 years ago [facility]. She has symptoms consistent with left subclavian steal syndrome with left arm fatigue and claudication as well as some dizziness. She is anxious to proceed with surgery given her insurance will change at the beginning of the year. I have recommended a left carotid subclavian bypass. I have gotten a message from Dr. Gwenlyn Found that he is okay holding her Brilinta for surgery even though it has not been one year since her drug-eluting stents were placed. Will need her off Brilinta for 5 days. Surgery is planned for Monday, December 28. We discussed risks and benefits including risk of vessel injury, stroke, infection, nerve injury, chyle leak, failure of bypass etc.  Morgan Heck, MD  [facility]  Office: [phone]  Pager: [phone]

## 2019-07-30 NOTE — Progress Notes (Signed)
Inpatient Diabetes Program Recommendations  AACE/ADA: New Consensus Statement on Inpatient Glycemic Control (2015)  Target Ranges:  Prepandial:   less than 140 mg/dL      Peak postprandial:   less than 180 mg/dL (1-2 hours)      Critically ill patients:  140 - 180 mg/dL   Lab Results  Component Value Date   GLUCAP 390 (H) 07/30/2019   HGBA1C 14.2 (H) 07/30/2019    Review of Glycemic Control Results for LENOR, SATHRE (MRN [id number]) as of 07/30/2019 10:49  Ref. Range 07/26/2019 11:29 07/30/2019 05:59  Glucose-Capillary Latest Ref Range: 70 - 99 mg/dL 233 (H) 390 (H)   Diabetes history: DM2 Outpatient Diabetes medications: Lantus 35 units bid + Humalog 25 units tid meal coverage Current orders for Inpatient glycemic control: IV insulin during surgery  Inpatient Diabetes Program Recommendations:   A1c increased from 12.6 05/12/19 to 14.2 currently. Will followup post surgery.  Thank you, Nani Gasser. Krzysztof Reichelt, RN, MSN, CDE  Diabetes Coordinator Inpatient Glycemic Control Team Team Pager [phone] (8am-5pm) 07/30/2019 11:21 AM

## 2019-07-30 NOTE — Anesthesia Procedure Notes (Signed)
Procedure Name: Intubation Date/Time: 07/30/2019 8:10 AM Performed by: Lavell Luster, CRNA Pre-anesthesia Checklist: Patient identified, Emergency Drugs available, Suction available, Patient being monitored and Timeout performed Patient Re-evaluated:Patient Re-evaluated prior to induction Oxygen Delivery Method: Circle system utilized Preoxygenation: Pre-oxygenation with 100% oxygen Induction Type: IV induction Ventilation: Mask ventilation without difficulty Laryngoscope Size: Mac, 3 and Glidescope Grade View: Grade I Tube type: Oral Tube size: 7.5 mm Number of attempts: 1 Airway Equipment and Method: Stylet and Video-laryngoscopy Placement Confirmation: ETT inserted through vocal cords under direct vision,  positive ETCO2 and breath sounds checked- equal and bilateral Secured at: 21 cm Tube secured with: Tape Dental Injury: Teeth and Oropharynx as per pre-operative assessment  Difficulty Due To: Difficulty was anticipated

## 2019-07-30 NOTE — Progress Notes (Signed)
Patient arrived 4E on a hospital bed from PACU assessment completed see flowsheet, placed on tele ccmd notified,patient oriented to room and staff, bed in lowest position, call bell within reach  vital signs are stable, will continue to monitor.

## 2019-07-30 NOTE — Plan of Care (Signed)
  Problem: Clinical Measurements: Goal: Ability to maintain clinical measurements within normal limits will improve Outcome: Progressing   Problem: Activity: Goal: Risk for activity intolerance will decrease Outcome: Progressing   Problem: Pain Managment: Goal: General experience of comfort will improve Outcome: Progressing   

## 2019-07-30 NOTE — Anesthesia Postprocedure Evaluation (Signed)
Anesthesia Post Note  Patient: Morgan [name]  Procedure(s) Performed: LEFT CAROTID-SUBCLAVIAN BYPASS GRAFT (Left Neck)     Patient location during evaluation: PACU Anesthesia Type: General Level of consciousness: awake and alert Pain management: pain level controlled Vital Signs Assessment: post-procedure vital signs reviewed and stable Respiratory status: spontaneous breathing, nonlabored ventilation and respiratory function stable Cardiovascular status: blood pressure returned to baseline and stable Postop Assessment: no apparent nausea or vomiting Anesthetic complications: no    Last Vitals:  Vitals:   07/30/19 1330 07/30/19 1402  BP: (!) 165/67 120/67  Pulse: 76   Resp: (!) 22 (!) 22  Temp: 36.5 C 37.1 C  SpO2: 96% 93%    Last Pain:  Vitals:   07/30/19 1402  TempSrc: Tympanic  PainSc: 0-No pain                 Audry Pili

## 2019-07-30 NOTE — Transfer of Care (Signed)
Immediate Anesthesia Transfer of Care Note  Patient: Morgan [name]  Procedure(s) Performed: LEFT CAROTID-SUBCLAVIAN BYPASS GRAFT (Left Neck)  Patient Location: PACU  Anesthesia Type:General  Level of Consciousness: awake, alert , oriented and sedated  Airway & Oxygen Therapy: Patient connected to face mask oxygen  Post-op Assessment: Report given to RN, Post -op Vital signs reviewed and stable and Patient moving all extremities  Post vital signs: Reviewed and stable  Last Vitals:  Vitals Value Taken Time  BP 156/92 07/30/19 1210  Temp    Pulse 63 07/30/19 1214  Resp 29 07/30/19 1214  SpO2 94 % 07/30/19 1214  Vitals shown include unvalidated device data.  Last Pain:  Vitals:   07/30/19 0610  TempSrc:   PainSc: 0-No pain         Complications: No apparent anesthesia complications

## 2019-07-30 NOTE — Op Note (Signed)
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  Left common carotid artery to subclavian artery bypass (7 mm Dacron graft) 2.  Placement of left neck 10 French flat drain under the scalene fat pad  Surgeon: Dr. Marty Heck, MD  Assistant: Risa Grill, PA  Indications: Patient is a 53 year old female who was initially evaluated by Dr. Gwenlyn Found with occluded left subclavian stent and concern for left arm claudication symptoms.  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  Dr. Gwenlyn Found performed arch aortogram and attempted endovascular intervention that was unsuccessful.  Ultimately she presents today for left common carotid to subclavian artery bypass after risk benefits were discussed.  Findings: Both the left common carotid as well as the left subclavian artery were soft and amendable to graft placement.  A 7 mm Dacron graft was sewn from the left common carotid artery end to side onto the left subclavian artery end to side.  Patient had excellent triphasic signals in both the common carotid and subclavian distal to the graft.  The left subclavian artery augmented significantly when the graft was open.  She has a palpable left radial pulse now.  She awoke with no neurologic deficits.  I did place a 41 F drain out of precaution given her morbid obesity and extensive dissection of the scalene fat pad.  Anesthesia: General  Details: Patient was taken to the operating room after informed consent was obtained.  She was placed on operative table in supine position.  General endotracheal anesthesia was induced.  Ultimately bump was placed under her shoulder and her head was turned to the right.  I used ultrasound preoperatively to identify her common carotid artery that was marked on the skin above the  clavicle.  The left neck was then prepped and draped usual sterile fashion.  Patient was given preoperative antibiotics.  Initially made an incision over the left clavicle approximately 1 fingerbreadth above the clavicle.  Ultimately dissected down with Bovie cautery and open the platysma and subplatysmal flaps were raised.  I did take down the clavicular head of the sternocleidomastoid.  We did encounter the external jugular vein that was ligated between 2-0 silk ties and divided.  Ultimately initially started my dissection medially and identified the internal jugular vein that was dissected with Metzenbaums as well as the vagus nerve that was preserved.  Ultimately we found the common carotid artery that was controlled with a large vessel loop after it was dissected out proximally distally with Metzenbaum scissors.  I then mobilized the scalene fat pad superior laterally.  I was very careful during this dissection.  Any lymphatic channels or vessels were ligated between either 3-0 silk ties and divided or vessel clips.  Ultimately once this was completely mobilized we identified the phrenic nerve on top of the anterior scalene muscle that was controlled with a small vessel loop and preserved.  I then used Bovie cautery and took down the anterior scalene muscle with Bovie cautery careful to preserve the overlying nerve.  Upon further dissection inferiorly ultimately identified the brachial plexus and then the subclavian artery.  It was very difficult to visualize under her left clavicle due to her obesity.  Ultimately this was visualized with appendiceal retractor until I bluntly dissected proximally distally to get nice clamp sites on the subclavian artery.  That point in time  we selected a 7 mm Dacron graft given that the artery was fairly small.  Patient was given 100 units/kg heparin.  ACT's were then checked.  She was subsubtherapeutic and additional heparin was given throughout the case to get her therapeutic  and ACT above 250.  I used baby profunda clamps to clamp the subclavian artery and then a 11 blade scalpel was used to create an arteriotomy and this was extended with Potts scissors.  The 7 mm dacron graft was brought on the field and then spatulated.  I used a 5-0 Prolene parachute format initially on the back wall first.  Prior to completing the entire anastomosis the arteries were flushed antegrade and backbled and then anastomosis was complete.  No additional patch sutures were required.  I then flushed heparinized saline into the graft and clamped it with a fistula clamp.  I then used baby profunda clamps and the common carotid artery was then clamped where the graft would lay accordingly for fairly easy anastomosis.  I then opened the left common carotid artery on the lateral wall with 11 blade scalpel extended with Potts scissors.  Again the graft was spatulated for end-to-side anastomosis.  5-0 Prolene was used for parachute anastomosis.  Again prior to completion I forward bled and back bled everything and flushed everything with heparinized saline.  Initially came off the clamp on the graft first and then the proximal clamp on the common carotid to flush everything down the left arm.  I then came off the clamp to the distal common carotid artery.  Patient had excellent triphasic signals in the common carotid as well as the subclavian distal to the graft.  The subclavian signal augmented significantly when the graft was open.  Patient was given protamine for reversal.  I then looked again to make sure we did not have any obvious lymphatic leak.  I do not see any lymph fluid leaking.  The fat pad was then reapproximated with several 3-0 Vicryl's.  I then tunneled a 10 French flat drain laterally on the neck and placed this under the fat pad just to ensure there was no underlying lymphatic leak that we did not identify.  The platysma was run closed with a 3-0 Vicryl.  Skin was run closed with 4-0 Monocryl.   Dermabond was applied.  Complication: None  Condition: Stable  Marty Heck, MD [facility] [place] Office: [phone] Pager: [place]

## 2019-07-30 NOTE — Anesthesia Procedure Notes (Signed)
Arterial Line Insertion Start/End12/28/2020 7:30 AM, 07/30/2019 7:40 AM Performed by: Lillia Abed, MD  Patient location: OR. Preanesthetic checklist: patient identified, IV checked, risks and benefits discussed, surgical consent, monitors and equipment checked, pre-op evaluation, timeout performed and anesthesia consent Lidocaine 1% used for infiltration Right, brachial was placed Catheter size: 20 G Seldinger technique used  Attempts: 1 Procedure performed without using ultrasound guided technique. Following insertion, line sutured, dressing applied and Biopatch. Post procedure assessment: normal  Patient tolerated the procedure well with no immediate complications.

## 2019-07-31 ENCOUNTER — Encounter: Payer: Self-pay | Admitting: *Deleted

## 2019-07-31 LAB — BASIC METABOLIC PANEL
Anion gap: 10 (ref 5–15)
BUN: 10 mg/dL (ref 6–20)
CO2: 22 mmol/L (ref 22–32)
Calcium: 8.5 mg/dL — ABNORMAL LOW (ref 8.9–10.3)
Chloride: 104 mmol/L (ref 98–111)
Creatinine, Ser: 0.6 mg/dL (ref 0.44–1.00)
GFR calc Af Amer: >60 mL/min (ref >60)
GFR calc non Af Amer: >60 mL/min (ref >60)
Glucose, Bld: 257 mg/dL — ABNORMAL HIGH (ref 70–99)
Potassium: 4.1 mmol/L (ref 3.5–5.1)
Sodium: 136 mmol/L (ref 135–145)

## 2019-07-31 LAB — CBC
HCT: 33.5 % — ABNORMAL LOW (ref 36.0–46.0)
Hemoglobin: 10.2 g/dL — ABNORMAL LOW (ref 12.0–15.0)
MCH: 20.4 pg — ABNORMAL LOW (ref 26.0–34.0)
MCHC: 30.4 g/dL (ref 30.0–36.0)
MCV: 66.9 fL — ABNORMAL LOW (ref 80.0–100.0)
Platelets: 254 10*3/uL (ref 150–400)
RBC: 5.01 MIL/uL (ref 3.87–5.11)
RDW: 16.5 % — ABNORMAL HIGH (ref 11.5–15.5)
WBC: 15.3 10*3/uL — ABNORMAL HIGH (ref 4.0–10.5)
nRBC: 0 % (ref 0.0–0.2)

## 2019-07-31 LAB — GLUCOSE, CAPILLARY
Glucose-Capillary: 252 mg/dL — ABNORMAL HIGH (ref 70–99)
Glucose-Capillary: 312 mg/dL — ABNORMAL HIGH (ref 70–99)
Glucose-Capillary: 285 mg/dL — ABNORMAL HIGH (ref 70–99)
Glucose-Capillary: 367 mg/dL — ABNORMAL HIGH (ref 70–99)

## 2019-07-31 MED ORDER — LURASIDONE HCL 20 MG PO TABS
60.0000 mg | ORAL_TABLET | Freq: Every day | ORAL | Status: DC
  Administered 2019-07-31: 22:00:00 60 mg via ORAL
  Filled 2019-07-31: qty 3

## 2019-07-31 MED ORDER — FLUOXETINE HCL 20 MG PO CAPS
60.0000 mg | ORAL_CAPSULE | Freq: Every day | ORAL | Status: DC
  Administered 2019-07-31 – 2019-08-01 (×2): 60 mg via ORAL
  Filled 2019-07-31 (×2): qty 3

## 2019-07-31 MED ORDER — TICAGRELOR 90 MG PO TABS
90.0000 mg | ORAL_TABLET | Freq: Two times a day (BID) | ORAL | Status: DC
  Administered 2019-07-31 – 2019-08-01 (×3): 90 mg via ORAL
  Filled 2019-07-31 (×3): qty 1

## 2019-07-31 MED ORDER — HEPARIN SODIUM (PORCINE) 5000 UNIT/ML IJ SOLN
5000.0000 [IU] | Freq: Three times a day (TID) | INTRAMUSCULAR | Status: DC
  Administered 2019-07-31 – 2019-08-01 (×3): 5000 [IU] via SUBCUTANEOUS
  Filled 2019-07-31 (×3): qty 1

## 2019-07-31 MED ORDER — FUROSEMIDE 20 MG PO TABS
20.0000 mg | ORAL_TABLET | Freq: Two times a day (BID) | ORAL | Status: DC
  Administered 2019-07-31 – 2019-08-01 (×3): 20 mg via ORAL
  Filled 2019-07-31 (×3): qty 1

## 2019-07-31 MED ORDER — POTASSIUM CHLORIDE CRYS ER 20 MEQ PO TBCR
20.0000 meq | EXTENDED_RELEASE_TABLET | Freq: Two times a day (BID) | ORAL | Status: DC
  Administered 2019-07-31 – 2019-08-01 (×3): 20 meq via ORAL
  Filled 2019-07-31 (×3): qty 1

## 2019-07-31 NOTE — Progress Notes (Addendum)
   Inpatient Diabetes Program Recommendations  AACE/ADA: New Consensus Statement on Inpatient Glycemic Control (2015)  Target Ranges:  Prepandial:   less than 140 mg/dL      Peak postprandial:   less than 180 mg/dL (1-2 hours)      Critically ill patients:  140 - 180 mg/dL   Lab Results  Component Value Date   GLUCAP 252 (H) 07/31/2019   HGBA1C 14.2 (H) 07/30/2019    Review of Glycemic Control  Results for AMER, JAYNES (MRN [id number]) as of 07/31/2019 09:24  Ref. Range 07/30/2019 12:09 07/30/2019 15:48 07/30/2019 21:00 07/30/2019 22:38 07/31/2019 06:00  Glucose-Capillary Latest Ref Range: 70 - 99 mg/dL 215 (H)  NOVOLOG 5 units  262 (H)  NOVOLOG 8 units 329 (H)   309 (H)  NOVOLOG 4 units 252 (H)  NOVOLOG 5 units    Diabetes history: DM2 Outpatient Diabetes medications: Lantus 35 units bid + Humalog 25 units tid meal coverage Current orders for Inpatient glycemic control: Novolog 0-9 TID with meals & 0-5 at bedtime  Inpatient Diabetes Program Recommendations:     Note: No basal insulin given with transitioning off of IV insulin drip yesterday.  Fasting CBG is 252 and CBG's are consistently >200  -Please consider adding Lantus 18 units BID and Novolog 10 units meal coverage if eats at least 50%  Addendum@1633 -Spoke with patient at bedside.  Reviewed patient's current A1c of 14.2%. Explained what a A1c is and what it measures. Also reviewed goal A1c with patient, importance of good glucose control @ home, and blood sugar goals.  She states hers usually runs 10-13% and her blood sugar has been much more elevated recently in the 500-600's.  Current with Dr. Clydie Braun for PCP.  Denies difficulties obtaining medications.  Checks her blood sugar before breakfast and at bedtime.    Reviewed the Plate Method and carbohydrates with patient.  She states she does not drink and sugary drinks.    Reviewed hypoglycemia and treatment.  Will continue to follow.  Thank  you, Geoffry Paradise, RN, BSN Diabetes Coordinator Inpatient Diabetes Program [phone] (team pager from 8a-5p)

## 2019-07-31 NOTE — Progress Notes (Addendum)
Progress Note    07/31/2019 7:13 AM 1 Day Post-Op  Subjective: Left subclavian carotid bypass.  She is complaining of left shoulder pain and tingling in both hands.  Voiding spontaneously.  She has been out of bed to bedside commode  She denies nausea, vomiting, dysphagia.  She is tolerating her diet  Vitals:   07/31/19 0051 07/31/19 0451  BP: 131/69 120/62  Pulse: 80 78  Resp: 20 20  Temp: 98.8 F (37.1 C) 98.6 F (37 C)  SpO2: 95% 96%   JP drain output 60 cc  Physical Exam: Cardiac: Rate/rhythm regular Lungs: There to auscultation bilaterally Incisions: Left anterior chest incision with mild ecchymosis.  Scant serosanguineous drain output Extremities:  5/5 lateral grip strength Abdomen: Soft, normal bowel sounds  Neuro: Alert and oriented x4.  Tongue midline.  Face symmetrical  CBC    Component Value Date/Time   WBC 15.3 (H) 07/31/2019 0318   RBC 5.01 07/31/2019 0318   HGB 10.2 (L) 07/31/2019 0318   HGB 13.2 06/05/2019 1053   HCT 33.5 (L) 07/31/2019 0318   HCT 45.6 06/05/2019 1053   PLT 254 07/31/2019 0318   PLT 385 06/05/2019 1053   MCV 66.9 (L) 07/31/2019 0318   MCV 70 (L) 06/05/2019 1053   MCH 20.4 (L) 07/31/2019 0318   MCHC 30.4 07/31/2019 0318   RDW 16.5 (H) 07/31/2019 0318   RDW 18.3 (H) 06/05/2019 1053   LYMPHSABS 2.7 05/14/2019 0309   MONOABS 1.1 (H) 05/14/2019 0309   EOSABS 0.0 05/14/2019 0309   BASOSABS 0.0 05/14/2019 0309    BMET    Component Value Date/Time   NA 136 07/31/2019 0318   NA 136 06/05/2019 1053   K 4.1 07/31/2019 0318   CL 104 07/31/2019 0318   CO2 22 07/31/2019 0318   GLUCOSE 257 (H) 07/31/2019 0318   BUN 10 07/31/2019 0318   BUN 13 06/05/2019 1053   CREATININE 0.60 07/31/2019 0318   CALCIUM 8.5 (L) 07/31/2019 0318   GFRNONAA >60 07/31/2019 0318   GFRAA >60 07/31/2019 0318     Intake/Output Summary (Last 24 hours) at 07/31/2019 0713 Last data filed at 07/31/2019 [id number] Gross per 24 hour  Intake 1141.67 ml  Output  260 ml  Net 881.67 ml    HOSPITAL MEDICATIONS Scheduled Meds: . aspirin  81 mg Oral Daily  . atorvastatin  80 mg Oral QHS  . docusate sodium  100 mg Oral Daily  . insulin aspart  0-5 Units Subcutaneous QHS  . insulin aspart  0-9 Units Subcutaneous TID WC  . lamoTRIgine  200 mg Oral Daily  . levothyroxine  150 mcg Oral QHS  . losartan  100 mg Oral Daily  . metoprolol tartrate  50 mg Oral BID  . mometasone-formoterol  2 puff Inhalation BID  . topiramate  50 mg Oral QHS   Continuous Infusions: . sodium chloride    . magnesium sulfate bolus IVPB     PRN Meds:.sodium chloride, acetaminophen **OR** acetaminophen, albuterol, alum & mag hydroxide-simeth, bisacodyl, EPINEPHrine, guaiFENesin-dextromethorphan, hydrALAZINE, HYDROcodone-acetaminophen, HYDROmorphone (DILAUDID) injection, labetalol, magnesium sulfate bolus IVPB, metoprolol tartrate, nitroGLYCERIN, phenol, potassium chloride, senna-docusate  Assessment:  53 y.o. female is s/p:  Left carotid subclavian bypass.  Her vital signs are stable.  She is afebrile.  Neurologically intact.  Out of bed today. 1 Day Post-Op  Plan:  Discuss drain removal with Dr. Carlis Abbott. Start heparin for DVT prophy     Risa Grill, PA-C [facility] [phone] 07/31/2019  7:13 AM  I have seen and evaluated the patient. I agree with the PA note as documented above.  Postop day 1 status post left carotid subclavian bypass.  Has a palpable left radial pulse.  She seems very satisfied that they can now measure her blood pressure in left arm.  Overall seems to be doing very well.  Drain with 60 cc of serosanguineous output.  We will remove drain today.  Does complain of some difficulty raising her left arm and this could be positioning versus manipulation of the brachial plexus.  Will engage PT OT today.  Will likely need some outpatient therapy.  We will keep her at least 1 more day in the hospital.  Start DVT prophylaxis.  Complains of  tingling in both hands which is unusual but otherwise is neuro intact.  Marty Heck, MD [facility] Office: [phone] Pager: [phone]

## 2019-07-31 NOTE — Evaluation (Signed)
Occupational Therapy Evaluation Patient Details Name: Morgan [name] MRN: [id number] DOB: 08-22-1965 Today's Date: 07/31/2019    History of Present Illness 53yo female with increased L UE claudication symptoms and dizziness. Received L carotid-subclavian bypass graft 07/30/19, now c/o increased L UE numbness and weakness. PMH anxiety, bipolar disorder, HLD, HTN, migraine, NSTEMI s/p coronary stent, CVA, DM, thalassemia, L knee reconstruction   Clinical Impression   This 53 y/o female presents with the above. PTA pt reports independence with ADL, iADL and functional mobility. Pt presenting with weakness, decreased standing balance, bil UE deficits (LUE>RUE). Pt requiring minA for room level functional mobility using HHA, requiring minA for toileting and standing grooming ADL, modA for LB ADL. Pt with edemous LUE and painful/limited AROM. Initiated LUE ROM/HEP and fine motor/coordination HEP for bil UEs as pt reports decreased sensation in both UEs. Pt reports will have intermittent assist from son at time of discharge but typically lives alone. She will benefit from continued acute OT services and recommend follow up therapy services after discharge (outpt vs HH, pending transportation) to further address UE deficits and to progress pt towards her PLOF. Will follow.     Follow Up Recommendations  Outpatient OT;Supervision/Assistance - 24 hour;Home health OT(outpt vs HH initially pending ability to get to outpt)    Equipment Recommendations  Tub/shower seat           Precautions / Restrictions Precautions Precautions: Fall;Other (comment) Precaution Comments: very numb/weak L UE Restrictions Weight Bearing Restrictions: No      Mobility Bed Mobility Overal bed mobility: Needs Assistance Bed Mobility: Supine to Sit;Sit to Supine     Supine to sit: Min assist Sit to supine: Min guard   General bed mobility comments: assist for trunk elevation to sitting, close guard for safety  when returning to supine  Transfers Overall transfer level: Needs assistance Equipment used: None Transfers: Sit to/from Stand Sit to Stand: Min guard         General transfer comment: close mingaurd for safety and balance, pt reports feeling "shaky"    Balance Overall balance assessment: Needs assistance Sitting-balance support: Feet supported Sitting balance-Leahy Scale: Good     Standing balance support: Single extremity supported;No upper extremity supported;During functional activity Standing balance-Leahy Scale: Fair Standing balance comment: able to statically stand without UE support, seeking at least single UE support for mobility                           ADL either performed or assessed with clinical judgement   ADL Overall ADL's : Needs assistance/impaired Eating/Feeding: Set up;Minimal assistance;Sitting Eating/Feeding Details (indicate cue type and reason): reports difficulty holding a utensil Grooming: Wash/dry hands;Minimal assistance;Standing Grooming Details (indicate cue type and reason): assist for standing balance Upper Body Bathing: Minimal assistance;Sitting   Lower Body Bathing: Moderate assistance;Sit to/from stand   Upper Body Dressing : Minimal assistance;Sitting   Lower Body Dressing: Moderate assistance;Sit to/from stand   Toilet Transfer: Minimal assistance;Ambulation;Regular Toilet   Toileting- Clothing Manipulation and Hygiene: Minimal assistance;Min guard;Sitting/lateral lean;Sit to/from stand Toileting - Clothing Manipulation Details (indicate cue type and reason): pt performing pericare via lateral leans with close minguard for safety     Functional mobility during ADLs: Minimal assistance General ADL Comments: pt reports feeling "shaky", requiring increased time for mobility tasks; initiated UE HEP for L/R UE  Pertinent Vitals/Pain Pain Assessment: 0-10 Pain Score: 8  Pain Location:  head, neck, ear (L side) Pain Descriptors / Indicators: Aching;Sore;Grimacing;Discomfort Pain Intervention(s): Limited activity within patient's tolerance;Monitored during session;Patient requesting pain meds-RN notified;RN gave pain meds during session     Hand Dominance Right   Extremity/Trunk Assessment Upper Extremity Assessment Upper Extremity Assessment: RUE deficits/detail;LUE deficits/detail RUE Deficits / Details: decreased sensation in R hand/palm, reports decreased fine motor RUE Sensation: decreased light touch RUE Coordination: decreased fine motor LUE Deficits / Details: very limited L shoulder and elbow ROM due to pain, edemous L hand/UE, decreased sensation throughout hand and into forearm LUE: Unable to fully assess due to pain LUE Sensation: decreased light touch;decreased proprioception LUE Coordination: decreased fine motor;decreased gross motor   Lower Extremity Assessment Lower Extremity Assessment: Defer to PT evaluation   Cervical / Trunk Assessment Cervical / Trunk Assessment: Normal   Communication Communication Communication: No difficulties   Cognition Arousal/Alertness: Awake/alert Behavior During Therapy: Flat affect Overall Cognitive Status: Within Functional Limits for tasks assessed                                 General Comments: pt sleeping upon entry but able to arouse and pt participatory   General Comments       Exercises Exercises: General Upper Extremity;Other exercises;Hand exercises General Exercises - Upper Extremity Wrist Flexion: AROM;Left;10 reps Wrist Extension: AROM;Left;10 reps Digit Composite Flexion: AROM;Left;10 reps Composite Extension: AROM;Left;10 reps Hand Exercises Forearm Supination: AROM;Left;10 reps Forearm Pronation: AROM;Left;10 reps Digit Composite Abduction: AROM;Left;5 reps Digit Composite Adduction: AROM;Left;5 reps Opposition: AROM;Both;Seated Other Exercises Other Exercises: lap slides  x10 LUE Other Exercises: issued squeeze ball Other Exercises: issued fine motor/coordination HEP   Shoulder Instructions      Home Living Family/patient expects to be discharged to:: Private residence Living Arrangements: Children;Alone Available Help at Discharge: Family;Available PRN/intermittently Type of Home: House Home Access: Stairs to enter [employer] of Steps: 3 Entrance Stairs-Rails: None Home Layout: One level     Bathroom Shower/Tub: Occupational psychologist: Standard     Home Equipment: Cane - single point;Walker - 2 wheels   Additional Comments: uses assistive devices intermittently      Prior Functioning/Environment Level of Independence: Independent with assistive device(s)        Comments: working        OT Problem List: Decreased strength;Decreased range of motion;Decreased activity tolerance;Impaired balance (sitting and/or standing);Impaired UE functional use;Pain;Decreased coordination      OT Treatment/Interventions: Self-care/ADL training;Therapeutic exercise;Energy conservation;DME and/or AE instruction;Therapeutic activities;Patient/family education;Balance training    OT Goals(Current goals can be found in the care plan section) Acute Rehab OT Goals Patient Stated Goal: get function back in L UE OT Goal Formulation: With patient Time For Goal Achievement: 08/14/19 Potential to Achieve Goals: Good  OT Frequency: Min 2X/week   Barriers to D/C:            Co-evaluation              AM-PAC OT "6 Clicks" Daily Activity     Outcome Measure Help from another person eating meals?: A Little Help from another person taking care of personal grooming?: A Little Help from another person toileting, which includes using toliet, bedpan, or urinal?: A Little Help from another person bathing (including washing, rinsing, drying)?: A Lot Help from another person to put on and taking off regular upper body clothing?: A  Little Help from another person to put on and taking off regular lower body clothing?: A Lot 6 Click Score: 16   End of Session Equipment Utilized During Treatment: Oxygen Nurse Communication: Mobility status  Activity Tolerance: Patient tolerated treatment well;Patient limited by pain Patient left: in bed  OT Visit Diagnosis: Unsteadiness on feet (R26.81);Muscle weakness (generalized) (M62.81);Pain Pain - Right/Left: Left Pain - part of body: Shoulder;Arm                Time: [id number] OT Time Calculation (min): 32 min Charges:  OT General Charges $OT Visit: 1 Visit OT Evaluation $OT Eval Moderate Complexity: 1 Mod OT Treatments $Self Care/Home Management : 8-22 mins  Lou Cal, OT [facility] Pager [phone] Office [phone]   Raymondo Band 07/31/2019, 5:07 PM

## 2019-07-31 NOTE — Evaluation (Signed)
Physical Therapy Evaluation Patient Details Name: Morgan [name] MRN: [id number] DOB: 09-Jan-1966 Today's Date: 07/31/2019   History of Present Illness  53yo female with increased L UE claudication symptoms and dizziness. Received L carotid-subclavian bypass graft 07/30/19, now c/o increased L UE numbness and weakness. PMH anxiety, bipolar disorder, HLD, HTN, migraine, NSTEMI s/p coronary stent, CVA, DM, thalassemia, L knee reconstruction  Clinical Impression   Patient received asleep in bed, easily woken and cooperative with PT but very tired as she did not sleep well last night. Able to perform bed mobility, functional transfers, and gait approximately 2f with no device and MinA fading to Min guard but does seem in general quite a bit weaker than her usual state. Specific measures for L shoulder MMT as below- I have paged OT asking them to see this patient today if staffing allows and they are able. Educated on general L UE exercises including making and releasing fist, as well as wrist/elbow flexion and extension- defer to OT for shoulder exercises given severe compensation pattern with shoulder movements. She was left in bed with all needs met, bed alarm active and quickly went back to sleep. SpO2 WNL on room air with activity. Currently recommending skilled HHPT services moving forward.     Follow Up Recommendations Home health PT;Supervision for mobility/OOB    Equipment Recommendations  Other (comment)(well equipped)    Recommendations for Other Services       Precautions / Restrictions Precautions Precautions: Fall;Other (comment) Precaution Comments: very numb/weak L UE Restrictions Weight Bearing Restrictions: No      Mobility  Bed Mobility Overal bed mobility: Needs Assistance Bed Mobility: Rolling;Sidelying to Sit;Sit to Sidelying Rolling: Min guard Sidelying to sit: Min assist     Sit to sidelying: Min assist General bed mobility comments: MinA to boost to full  upright and then for LE management with back to bed  Transfers Overall transfer level: Needs assistance Equipment used: None Transfers: Sit to/from Stand Sit to Stand: Min guard         General transfer comment: min guard for safety due to gross mild unsteadiness  Ambulation/Gait Ambulation/Gait assistance: Min guard;Min assist Gait Distance (Feet): 80 Feet Assistive device: None Gait Pattern/deviations: Step-through pattern;Decreased step length - right;Decreased step length - left;Drifts right/left;Narrow base of support Gait velocity: decreased   General Gait Details: mild unsteadiness with gait which improved with continued mobility, initially needed MinA for balance but this faded to min guard  Stairs            Wheelchair Mobility    Modified Rankin (Stroke Patients Only)       Balance Overall balance assessment: Needs assistance Sitting-balance support: Feet supported Sitting balance-Leahy Scale: Good Sitting balance - Comments: S for maintaining upright   Standing balance support: No upper extremity supported;During functional activity Standing balance-Leahy Scale: Fair Standing balance comment: initially MinA, faded to close min guard                             Pertinent Vitals/Pain Pain Assessment: 0-10 Pain Score: 2  Pain Location: neck where drain was removed Pain Descriptors / Indicators: Aching;Sore Pain Intervention(s): Limited activity within patient's tolerance;Monitored during session    [place] residence Living Arrangements: Children Available Help at Discharge: Family;Available PRN/intermittently Type of Home: House Home Access: Stairs to enter Entrance Stairs-Rails: None Entrance Stairs-Number of Steps: 3 Home Layout: One level Home Equipment:  Cane - single point;Walker - 2 wheels Additional Comments: uses assistive devices intermittently    Prior Function Level  of Independence: Independent with assistive device(s)               Hand Dominance        Extremity/Trunk Assessment   Upper Extremity Assessment Upper Extremity Assessment: RUE deficits/detail;LUE deficits/detail RUE Deficits / Details: shoulder flexion 4/5, shoulder abduction 4/5, biceps 4/5, triceps 3+/5, grossly weak grip strength RUE Sensation: WNL RUE Coordination: WNL LUE Deficits / Details: shoulder flexion and abduction 2/5 with compensatory movements, biceps 3/5, triceps 2+/5, grossly weak grip strength and moderately weaker than R LUE Sensation: decreased light touch;decreased proprioception LUE Coordination: decreased fine motor    Lower Extremity Assessment Lower Extremity Assessment: Generalized weakness    Cervical / Trunk Assessment Cervical / Trunk Assessment: Normal  Communication   Communication: No difficulties  Cognition Arousal/Alertness: Awake/alert Behavior During Therapy: Flat affect Overall Cognitive Status: Within Functional Limits for tasks assessed                                 General Comments: very cooperative and participatory, fatigued due to not sleeping well last night      General Comments General comments (skin integrity, edema, etc.): SpO2 92-98% on room air during activity    Exercises     Assessment/Plan    PT Assessment Patient needs continued PT services  PT Problem List Decreased strength;Obesity;Decreased activity tolerance;Decreased safety awareness;Decreased balance;Pain;Decreased mobility;Decreased coordination       PT Treatment Interventions DME instruction;Balance training;Gait training;Neuromuscular re-education;Stair training;Functional mobility training;Patient/family education;Therapeutic activities;Therapeutic exercise    PT Goals (Current goals can be found in the Care Plan section)  Acute Rehab PT Goals Patient Stated Goal: get function back in L UE PT Goal Formulation: With  patient Time For Goal Achievement: 08/14/19 Potential to Achieve Goals: Good    Frequency Min 3X/week   Barriers to discharge        Co-evaluation               AM-PAC PT "6 Clicks" Mobility  Outcome Measure Help needed turning from your back to your side while in a flat bed without using bedrails?: A Little Help needed moving from lying on your back to sitting on the side of a flat bed without using bedrails?: A Little Help needed moving to and from a bed to a chair (including a wheelchair)?: A Little Help needed standing up from a chair using your arms (e.g., wheelchair or bedside chair)?: A Little Help needed to walk in hospital room?: A Little Help needed climbing 3-5 steps with a railing? : A Little 6 Click Score: 18    End of Session   Activity Tolerance: Patient tolerated treatment well Patient left: in bed;with call bell/phone within reach;with bed alarm set   PT Visit Diagnosis: Unsteadiness on feet (R26.81);Difficulty in walking, not elsewhere classified (R26.2);Muscle weakness (generalized) (M62.81);Pain Pain - Right/Left: Left Pain - part of body: Arm    Time: 5465-0354 PT Time Calculation (min) (ACUTE ONLY): 25 min   Charges:   PT Evaluation $PT Eval Moderate Complexity: 1 Mod PT Treatments $Gait Training: 8-22 mins        Windell Norfolk, DPT, PN1   Supplemental Physical Therapist JAARS    Pager [phone] Acute Rehab Office [phone]

## 2019-08-01 LAB — GLUCOSE, CAPILLARY
Glucose-Capillary: 352 mg/dL — ABNORMAL HIGH (ref 70–99)
Glucose-Capillary: 394 mg/dL — ABNORMAL HIGH (ref 70–99)

## 2019-08-01 MED ORDER — HYDROCODONE-ACETAMINOPHEN 5-325 MG PO TABS: 1.0000 | ORAL_TABLET | Freq: Four times a day (QID) | ORAL | 0 refills | Status: DC | PRN

## 2019-08-01 NOTE — Discharge Summary (Signed)
Discharge Summary     Morgan [name] 07/24/1966 53 y.o. female  [id number]  Admission Date: 07/30/2019  Discharge Date: 08/01/2019 Physician: Marty Heck, MD  Admission Diagnosis: Status post carotid endarterectomy [Z98.890] Subclavian artery stenosis, left ([place]) [I77.1]   HPI:   This is a 53 y.o. female with occluded left subclavian stent and concern for left arm claudication symptoms. Arch aortogram and attempted endovascular intervention that was unsuccessful.  Hospital Course:  The patient was admitted to the hospital and taken to the operating room on 07/30/2019 and underwent left common carotid artery to subclavian artery bypass (7 mm Dacron graft) and placement of left neck 10 French flat drain under the scalene fat pad.   Findings: chronically occluded left subclavian stent  The pt tolerated the procedure well and was transported to the PACU in excellent condition.   By POD 1, the pt neuro status intact.  Her vital signs were stable and she was afebrile.  She remained in sinus rhythm.  She complained of left shoulder discomfort and tingling in the fingertips of both hands.  Physical therapy and Occupational Therapy were consulted for evaluation and treatment.  Her incision was well approximated and she had minimal serosanguineous drain output.  Her drain was discontinued.  On postoperative day 2, the tingling in her fingers was improved and she had 5 out of 5 bilateral grip strength.  She had some weakness of the left deltoid. Palpable 2+ left radial pulse.  She remained neurologically intact.  She is tolerating her diet and voiding spontaneously.  She is ready for discharge home  The remainder of the hospital course consisted of increasing mobilization and increasing intake of solids without difficulty.  We will make arrangements for home health versus outpatient occupational and physical therapy   Recent Labs    07/30/19 1105 07/31/19 0318  NA 136 136   K 3.8 4.1  CL 102 104  CO2  --  22  GLUCOSE 264* 257*  BUN 13 10  CALCIUM  --  8.5*   Recent Labs    07/30/19 1105 07/31/19 0318  WBC  --  15.3*  HGB 12.2 10.2*  HCT 36.0 33.5*  PLT  --  254   No results for input(s): INR in the last 72 hours.     Discharge Diagnosis:  Status post carotid endarterectomy [Z98.890] Subclavian artery stenosis, left ([facility]) [I77.1]  Secondary Diagnosis: Patient Active Problem List   Diagnosis Date Noted  . Status post carotid endarterectomy 07/30/2019  . Subclavian artery stenosis, left ([place]) 07/30/2019  . 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 Migraines 07/16/2019  . Stenosis of left subclavian artery ([place]) 06/05/2019  . Vitamin D deficiency 05/19/2019  . Elevated serum hCG 05/18/2019  . Chronic nonintractable headache   . Diabetes mellitus ([place]) 11/17/2018  . Type 2 diabetes mellitus with hyperglycemia, with long-term current use of insulin ([place]) 11/17/2018  . S/P coronary artery stent placement   . Acute systolic heart failure ([place])   . Hyperlipidemia LDL goal <70   . Esophagitis determined by endoscopy   . Dysphagia 07/24/2018  . Odynophagia 07/23/2018  . Orthostatic hypotension 07/22/2018  . Tachycardia 07/22/2018  . Hepatic steatosis   . Elevated transaminase level 07/21/2018  . Mild renal insufficiency 07/21/2018  . Bipolar depression ([place]) 07/21/2018  . Incidental lung nodule 07/21/2018  . Extravasation accident, initial encounter 07/21/2018  . Prolonged QT interval 07/21/2018  . LFTs abnormal   . Hyperlipidemia 05/14/2016  . Hypothyroidism 05/14/2016  .  Essential hypertension 05/14/2016   Past Medical History:  Diagnosis Date  . Anxiety   . Asthma   . Bipolar depression ([place])   . Chronic bronchitis ([place])   . Chronic esophagogastric ulcer   . Chronic stomach ulcer   . Fibroid   . GERD (gastroesophageal reflux disease)   . Heart murmur   . Hepatic steatosis   . History of blood transfusion 2010   "related to  subclavian stent"  . History of hiatal hernia   . Hyperlipidemia   . Hypertension   . Hypothyroid   . Migraine    "cerebral migraines; 1-2/month" (06/29/2018)  . Non-ST elevation (NSTEMI) myocardial infarction ([place]) 10/27/2018  . 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 Stroke [facility])    "I've had 2; most recent one was ~ 2015 or before; affected balance" (06/29/2018)  . Thalassemia minor   . Type II diabetes mellitus ([place])   . Vitamin D deficiency     Allergies as of 08/01/2019      Reactions   Ilosone [erythromycin] Anaphylaxis   Keflex [cephalexin] Anaphylaxis   Penicillins Anaphylaxis   Has patient had a PCN reaction causing immediate rash, facial/tongue/throat swelling, SOB or lightheadedness with hypotension: YES Has patient had a PCN reaction causing severe rash involving mucus membranes or skin necrosis: NO Has patient had a PCN reaction that required hospitalizationNO Has patient had a PCN reaction occurring within the last 10 years: NO If all of the above answers are "NO", then may proceed with Cephalosporin use.   Iron Other (See Comments)   Pt reports condition limiting Iron intake.   Metformin And Related Other (See Comments)   UNSPECIFIED REACTION  Pt prefers to not take this medication Side effects   Morphine And Related Other (See Comments)   Pt reports hallucinations      Medication List    TAKE these medications   Accu-Chek FastClix Lancets Misc 1 Package by Does not apply route 3 (three) times daily.   albuterol 108 (90 Base) MCG/ACT inhaler Commonly known as: VENTOLIN HFA Inhale 2 puffs into the lungs 2 (two) times daily as needed for wheezing or shortness of breath.   aspirin 81 MG tablet Take 1 tablet (81 mg total) by mouth daily.   atorvastatin 80 MG tablet Commonly known as: LIPITOR Take 80 mg by mouth at bedtime.   Contour Next One Kit 1 kit by Does not apply route as directed.   Contour Next  Test test strip Generic drug: glucose blood Use as instructed   EPINEPHrine 0.3 mg/0.3 mL Soaj injection Commonly known as: EPI-PEN Inject 0.3 mg into the muscle once as needed for anaphylaxis (for an anaphylactic reaction).   FLUoxetine 20 MG tablet Commonly known as: PROZAC Take 60 mg by mouth daily.   fluticasone-salmeterol 115-21 MCG/ACT inhaler Commonly known as: ADVAIR HFA Inhale 2 puffs into the lungs 2 (two) times daily.   furosemide 20 MG tablet Commonly known as: LASIX Take 1 tablet (20 mg total) by mouth 2 (two) times daily.   Glucagon Emergency 1 MG Kit Inject 1 mg into the vein once as needed (FOR ONSET OF HYPOGLYCEMIA).   HumaLOG KwikPen 200 UNIT/ML Sopn Generic drug: Insulin Lispro Inject 25 Units into the skin 3 (three) times daily.   HYDROcodone-acetaminophen 5-325 MG tablet Commonly known as: Norco Take 1 tablet by mouth every 6 (six) hours as needed for severe pain. What changed: when to take this  lamoTRIgine 200 MG tablet Commonly known as: LAMICTAL Take 200 mg by mouth daily.   Lantus SoloStar 100 UNIT/ML Solostar Pen Generic drug: Insulin Glargine Inject 35 Units into the skin 2 (two) times daily.   Latuda 60 MG Tabs Generic drug: Lurasidone HCl Take 60 mg by mouth at bedtime.   levothyroxine 150 MCG tablet Commonly known as: SYNTHROID Take 150 mcg by mouth at bedtime. 3:30 Am   losartan 100 MG tablet Commonly known as: COZAAR Take 1 tablet (100 mg total) by mouth daily.   metoprolol tartrate 50 MG tablet Commonly known as: LOPRESSOR Take 1 tablet (50 mg total) by mouth 2 (two) times daily.   multivitamin with minerals Tabs tablet Take 1 tablet by mouth daily. Woman 50 +   nitroGLYCERIN 0.4 MG SL tablet Commonly known as: NITROSTAT Place 1 tablet (0.4 mg total) under the tongue every 5 (five) minutes x 3 doses as needed for chest pain.   nystatin ointment Commonly known as: MYCOSTATIN Apply 1 application topically daily as  needed (infection). What changed: Another medication with the same name was changed. Make sure you understand how and when to take each.   nystatin powder Commonly known as: MYCOSTATIN/NYSTOP Apply topically as needed. What changed:   how much to take  when to take this  reasons to take this   pantoprazole 40 MG tablet Commonly known as: PROTONIX Take 1 tablet (40 mg total) by mouth 2 (two) times daily before a meal. X 8 weeks What changed:   how much to take  when to take this  additional instructions   Pen Needles 32G X 4 MM Misc 1 Device by Does not apply route 5 (five) times daily.   potassium chloride SA 20 MEQ tablet Commonly known as: KLOR-CON Take 1 tablet (20 mEq total) by mouth 2 (two) times daily.   ticagrelor 90 MG Tabs tablet Commonly known as: BRILINTA Take 1 tablet (90 mg total) by mouth 2 (two) times daily.   topiramate 50 MG tablet Commonly known as: Topamax Take 1 tablet (50 mg total) by mouth at bedtime.   topiramate 50 MG tablet Commonly known as: TOPAMAX Take 1 tablet (50 mg total) by mouth at bedtime.   traMADol 50 MG tablet Commonly known as: ULTRAM Take 50 mg by mouth every 6 (six) hours as needed for moderate pain (Migraine).   Vitamin D3 25 MCG (1000 UT) Caps Take 1,000-2,000 Units by mouth See admin instructions. Take 2 tablets by mouth once a day on Mon/Wed/Fri and take 1 tablet on Sun/Tues/Thurs/Sat        [facility] Discharge Instructions incision care  Please refer to the following instructions for your post-procedure care. Your surgeon or physician assistant will discuss any changes with you.  Activity  You are encouraged to walk as much as you can. You can slowly return to normal activities but must avoid strenuous activity and heavy lifting until your doctor tell you it's OK. Avoid activities such as vacuuming or swinging a golf club. You can drive after one week if you are comfortable and  you are no longer taking prescription pain medications. It is normal to feel tired for serval weeks after your surgery. It is also normal to have difficulty with sleep habits, eating, and bowel movements after surgery. These will go away with time.  Bathing/Showering  You may shower after you come home. Do not soak in a bathtub, hot tub, or swim until the incision heals  completely.  Incision Care  Shower every day. Clean your incision with mild soap and water. Pat the area dry with a clean towel. You do not need a bandage unless otherwise instructed. Do not apply any ointments or creams to your incision. You may have skin glue on your incision. Do not peel it off. It will come off on its own in about one week. Your incision may feel thickened and raised for several weeks after your surgery. This is normal and the skin will soften over time. For Men Only: It's OK to shave around the incision but do not shave the incision itself for 2 weeks. It is common to have numbness under your chin that could last for several months.  Diet  Resume your normal diet. There are no special food restrictions following this procedure. A low fat/low cholesterol diet is recommended for all patients with vascular disease. In order to heal from your surgery, it is CRITICAL to get adequate nutrition. Your body requires vitamins, minerals, and protein. Vegetables are the best source of vitamins and minerals. Vegetables also provide the perfect balance of protein. Processed food has little nutritional value, so try to avoid this.  Medications  Resume taking all of your medications unless your doctor or physician assistant tells you not to.  If your incision is causing pain, you may take over-the- counter pain relievers such as acetaminophen (Tylenol). If you were prescribed a stronger pain medication, please be aware these medications can cause nausea and constipation.  Prevent nausea by taking the medication with a snack or  meal. Avoid constipation by drinking plenty of fluids and eating foods with a high amount of fiber, such as fruits, vegetables, and grains.  Do not take Tylenol if you are taking prescription pain medications.  Follow Up  Our office will schedule a follow up appointment 2-3 weeks following discharge.  Please call us immediately for any of the following conditions  . Increased pain, redness, drainage (pus) from your incision site. . Fever of 101 degrees or higher. . If you should develop stroke (slurred speech, difficulty swallowing, weakness on one side of your body, loss of vision) you should call 911 and go to the nearest emergency room. .  Reduce your risk of vascular disease:  . Stop smoking. If you would like help call [facility] at 1-800-QUIT-NOW 734-060-5218) or [place] at [phone]. . Manage your cholesterol . Maintain a desired weight . Control your diabetes . Keep your blood pressure down  If you have any questions, please call the office at [phone].  Prescriptions given: 1.   Roxicet # No Refill     Disposition: Home  Patient's condition: is Excellent  Follow up: 1. Dr. Carlis Abbott in 2 weeks.   Risa Grill, PA-C [facility] [phone]   --- For [facility] use ---   Modified Rankin score at D/C (0-6): +1  IV medication needed for:  1. Hypertension: No 2. Hypotension: No  Post-op Complications: No  1. Post-op CVA or TIA: No  If yes: Event classification (right eye, left eye, right cortical, left cortical, verterobasilar, other):   If yes: Timing of event (intra-op, <6 hrs post-op, >=6 hrs post-op, unknown):   2. CN injury: No  If yes: CN  injuried   3. Myocardial infarction: No  If yes: Dx by (EKG or clinical, Troponin):   4.  CHF: No  5.  Dysrhythmia (new): No  6. Wound infection: No  7. Reperfusion symptoms: No  8.  Return to OR: No  If yes: return to OR for (bleeding, neurologic, other CEA incision,  other):   Discharge medications: Statin use:  Yes ASA use:  Yes   Beta blocker use:  Yes ACE-Inhibitor use:  no  ARB use:  Yes CCB use: No P2Y12 Antagonist use: No, _0  Plavix, _1  Plasugrel, _2  Ticlopinine, _3  Ticagrelor, _4  Other, _5  No for medical reason, _6  Non-compliant, _7  Not-indicated Anti-coagulant use:  Yes, _8  Warfarin, _9  Rivaroxaban, _10  Dabigatran,  ticagrelor

## 2019-08-01 NOTE — Progress Notes (Addendum)
Progress Note    08/01/2019 7:49 AM 2 Days Post-Op  Subjective: Patient is status post left subclavian to carotid artery bypass.  She is awake and alert this morning with continued complaints of tingling sensation in all fingers.  She states this is some better as compared to yesterday.    Vitals:   08/01/19 0411 08/01/19 0447  BP: (!) 144/69 (!) 144/69  Pulse: 87 87  Resp:  20  Temp: 99.4 F (37.4 C) 99.4 F (37.4 C)  SpO2: 95% 95%    Physical Exam: Cardiac: Heart rate and rhythm are regular Lungs: Clear to auscultation bilaterally Incisions: Left upper anterior chest incision is well approximated with some ecchymosis, no edema or drainage Extremities: 2+ palpable left radial artery, excellent bilateral grip strengt  CBC    Component Value Date/Time   WBC 15.3 (H) 07/31/2019 0318   RBC 5.01 07/31/2019 0318   HGB 10.2 (L) 07/31/2019 0318   HGB 13.2 06/05/2019 1053   HCT 33.5 (L) 07/31/2019 0318   HCT 45.6 06/05/2019 1053   PLT 254 07/31/2019 0318   PLT 385 06/05/2019 1053   MCV 66.9 (L) 07/31/2019 0318   MCV 70 (L) 06/05/2019 1053   MCH 20.4 (L) 07/31/2019 0318   MCHC 30.4 07/31/2019 0318   RDW 16.5 (H) 07/31/2019 0318   RDW 18.3 (H) 06/05/2019 1053   LYMPHSABS 2.7 05/14/2019 0309   MONOABS 1.1 (H) 05/14/2019 0309   EOSABS 0.0 05/14/2019 0309   BASOSABS 0.0 05/14/2019 0309    BMET    Component Value Date/Time   NA 136 07/31/2019 0318   NA 136 06/05/2019 1053   K 4.1 07/31/2019 0318   CL 104 07/31/2019 0318   CO2 22 07/31/2019 0318   GLUCOSE 257 (H) 07/31/2019 0318   BUN 10 07/31/2019 0318   BUN 13 06/05/2019 1053   CREATININE 0.60 07/31/2019 0318   CALCIUM 8.5 (L) 07/31/2019 0318   GFRNONAA >60 07/31/2019 0318   GFRAA >60 07/31/2019 0318    No intake or output data in the 24 hours ending 08/01/19 [place] Scheduled Meds: . aspirin  81 mg Oral Daily  . atorvastatin  80 mg Oral QHS  . docusate sodium  100 mg Oral Daily  .  FLUoxetine  60 mg Oral Daily  . furosemide  20 mg Oral BID  . heparin  5,000 Units Subcutaneous Q8H  . insulin aspart  0-5 Units Subcutaneous QHS  . insulin aspart  0-9 Units Subcutaneous TID WC  . lamoTRIgine  200 mg Oral Daily  . levothyroxine  150 mcg Oral QHS  . losartan  100 mg Oral Daily  . lurasidone  60 mg Oral QHS  . metoprolol tartrate  50 mg Oral BID  . mometasone-formoterol  2 puff Inhalation BID  . potassium chloride SA  20 mEq Oral BID  . ticagrelor  90 mg Oral BID  . topiramate  50 mg Oral QHS   Continuous Infusions: . sodium chloride    . magnesium sulfate bolus IVPB     PRN Meds:.sodium chloride, acetaminophen **OR** acetaminophen, albuterol, alum & mag hydroxide-simeth, bisacodyl, EPINEPHrine, guaiFENesin-dextromethorphan, hydrALAZINE, HYDROcodone-acetaminophen, HYDROmorphone (DILAUDID) injection, labetalol, magnesium sulfate bolus IVPB, metoprolol tartrate, nitroGLYCERIN, phenol, potassium chloride, senna-docusate  Assessment:  53 y.o. female is s/p:  Left subclavian to carotid bypass.  Neurologically intact with stable vital signs. 2 Days Post-Op  Plan: -Plan discharge home today with follow-up in the office in 2 to 3 weeks.  We discussed discharge instructions including  incision care, pain management and driving.  Resume home medications. Call for increased pain, swelling at incision, changes in vision extremity weakness.    Risa Grill, PA-C [facility] [phone] 08/01/2019  7:49 AM   I have seen and evaluated the patient. I agree with the PA note as documented above.  Postop day 2 status post left carotid subclavian bypass.  Drain was removed yesterday and incision looks excellent.  She has a palpable left radial pulse now.  Her grip strength in her left hand is now markedly improved.  Having some weakness with abduction.  Therapy evaluated and recommended outpatient OT PT and we will arrange.  Plan for discharge today.  Resume aspirin  and Brilinta.  I will see her in 3 weeks for wound check.  Marty Heck, MD [facility] [place] Office: [phone] Pager: [phone]

## 2019-08-01 NOTE — Progress Notes (Signed)
Physical Therapy Treatment Patient Details Name: Morgan [name] MRN: [id number] DOB: 1965/08/08 Today's Date: 08/01/2019    History of Present Illness 53yo female with increased L UE claudication symptoms and dizziness. Received L carotid-subclavian bypass graft 07/30/19, now c/o increased L UE numbness and weakness. PMH anxiety, bipolar disorder, HLD, HTN, migraine, NSTEMI s/p coronary stent, CVA, DM, thalassemia, L knee reconstruction    PT Comments    Patient received sitting at EOB, flat affect and eager to discharge home, amenable to participating in therapy. Mobility overall much improved today- able to complete functional transfers with S/no device, did continue to require Min guard for gait in hallway with deviations as noted below, one time MinA for balance assist when turning into room due to delayed reactions. She was left sitting at EOB with all needs met this morning. Continue to recommend skilled HHPT services moving forward.      Follow Up Recommendations  Home health PT;Supervision for mobility/OOB     Equipment Recommendations  Other (comment)(well-equipped)    Recommendations for Other Services       Precautions / Restrictions Precautions Precautions: Fall;Other (comment) Precaution Comments: very numb/weak L UE Restrictions Weight Bearing Restrictions: No    Mobility  Bed Mobility               General bed mobility comments: sitting EOB upon arrival  Transfers Overall transfer level: Needs assistance Equipment used: None Transfers: Sit to/from Stand Sit to Stand: Supervision         General transfer comment: S for safety with functional transfers, balance improved from evaluation yesterday  Ambulation/Gait Ambulation/Gait assistance: Min guard;Min assist Gait Distance (Feet): 200 Feet Assistive device: None Gait Pattern/deviations: Step-through pattern;Decreased step length - right;Decreased step length - left;Drifts right/left;Narrow base  of support Gait velocity: decreased   General Gait Details: balance and gait pattern has improved but she continues to demonstrate reduced trunk rotation and mild unsteadiness; able to gait train with S for majority of distance, did require one time minA to recover when she had some unsteadiness when turning into her room   Stairs             Wheelchair Mobility    Modified Rankin (Stroke Patients Only)       Balance Overall balance assessment: Needs assistance Sitting-balance support: Feet supported Sitting balance-Leahy Scale: Good Sitting balance - Comments: S for maintaining upright   Standing balance support: No upper extremity supported;During functional activity Standing balance-Leahy Scale: Fair Standing balance comment: min guard with one time MinA when turning into room                            Cognition Arousal/Alertness: Awake/alert Behavior During Therapy: Flat affect Overall Cognitive Status: Within Functional Limits for tasks assessed                                 General Comments: much more awake and alert today, ongoing flat affect but very cooperative and pleasant      Exercises    General Comments General comments (skin integrity, edema, etc.): VSS      Pertinent Vitals/Pain Pain Assessment: Faces Faces Pain Scale: Hurts a little bit Pain Location: surgical site in neck Pain Descriptors / Indicators: Aching;Sore;Grimacing;Discomfort Pain Intervention(s): Limited activity within patient's tolerance;Monitored during session    Home Living  Prior Function            PT Goals (current goals can now be found in the care plan section) Acute Rehab PT Goals Patient Stated Goal: get function back in L UE PT Goal Formulation: With patient Time For Goal Achievement: 08/14/19 Potential to Achieve Goals: Good Progress towards PT goals: Progressing toward goals    Frequency    Min  3X/week      PT Plan Current plan remains appropriate    Co-evaluation              AM-PAC PT "6 Clicks" Mobility   Outcome Measure  Help needed turning from your back to your side while in a flat bed without using bedrails?: A Little Help needed moving from lying on your back to sitting on the side of a flat bed without using bedrails?: A Little Help needed moving to and from a bed to a chair (including a wheelchair)?: A Little Help needed standing up from a chair using your arms (e.g., wheelchair or bedside chair)?: A Little Help needed to walk in hospital room?: A Little Help needed climbing 3-5 steps with a railing? : A Little 6 Click Score: 18    End of Session   Activity Tolerance: Patient tolerated treatment well Patient left: in bed;with call bell/phone within reach(sitting at EOB)   PT Visit Diagnosis: Unsteadiness on feet (R26.81);Difficulty in walking, not elsewhere classified (R26.2);Muscle weakness (generalized) (M62.81);Pain Pain - Right/Left: Left Pain - part of body: Arm     Time: 4734-0370 PT Time Calculation (min) (ACUTE ONLY): 10 min  Charges:  $Gait Training: 8-22 mins                     Windell Norfolk, DPT, PN1   Supplemental Physical Therapist [place]    Pager [phone] Acute Rehab Office [phone]

## 2019-08-01 NOTE — Discharge Instructions (Signed)
Incision Care, Adult °An incision is a cut that a doctor makes in your skin for surgery (for a procedure). Most times, these cuts are closed after surgery. Your cut from surgery may be closed with stitches (sutures), staples, skin glue, or skin tape (adhesive strips). You may need to return to your doctor to have stitches or staples taken out. This may happen many days or many weeks after your surgery. The cut needs to be well cared for so it does not get infected. °How to care for your cut °Cut care ° °· Follow instructions from your doctor about how to take care of your cut. Make sure you: °? Wash your hands with soap and water before you change your bandage (dressing). If you cannot use soap and water, use hand sanitizer. °? Change your bandage as told by your doctor. °? Leave stitches, skin glue, or skin tape in place. They may need to stay in place for 2 weeks or longer. If tape strips get loose and curl up, you may trim the loose edges. Do not remove tape strips completely unless your doctor says it is okay. °· Check your cut area every day for signs of infection. Check for: °? More redness, swelling, or pain. °? More fluid or blood. °? Warmth. °? Pus or a bad smell. °· Ask your doctor how to clean the cut. This may include: °? Using mild soap and water. °? Using a clean towel to pat the cut dry after you clean it. °? Putting a cream or ointment on the cut. Do this only as told by your doctor. °? Covering the cut with a clean bandage. °· Ask your doctor when you can leave the cut uncovered. °· Do not take baths, swim, or use a hot tub until your doctor says it is okay. Ask your doctor if you can take showers. You may only be allowed to take sponge baths for bathing. °Medicines °· If you were prescribed an antibiotic medicine, cream, or ointment, take the antibiotic or put it on the cut as told by your doctor. Do not stop taking or putting on the antibiotic even if your condition gets better. °· Take  over-the-counter and prescription medicines only as told by your doctor. °General instructions °· Limit movement around your cut. This helps healing. °? Avoid straining, lifting, or exercise for the first month, or for as long as told by your doctor. °? Follow instructions from your doctor about going back to your normal activities. °? Ask your doctor what activities are safe. °· Protect your cut from the sun when you are outside for the first 6 months, or for as long as told by your doctor. Put on sunscreen around the scar or cover up the scar. °· Keep all follow-up visits as told by your doctor. This is important. °Contact a doctor if: °· Your have more redness, swelling, or pain around the cut. °· You have more fluid or blood coming from the cut. °· Your cut feels warm to the touch. °· You have pus or a bad smell coming from the cut. °· You have a fever or shaking chills. °· You feel sick to your stomach (nauseous) or you throw up (vomit). °· You are dizzy. °· Your stitches or staples come undone. °Get help right away if: °· You have a red streak coming from your cut. °· Your cut bleeds through the bandage and the bleeding does not stop with gentle pressure. °· The edges of your cut   open up and separate. °· You have very bad (severe) pain. °· You have a rash. °· You are confused. °· You pass out (faint). °· You have trouble breathing and you have a fast heartbeat. °This information is not intended to replace advice given to you by your health care provider. Make sure you discuss any questions you have with your health care provider. °Document Released: 10/11/2011 Document Revised: 12/06/2016 Document Reviewed: 03/26/2016 °Elsevier Patient Education © 2020 [employer] ° °

## 2019-08-01 NOTE — Progress Notes (Signed)
Occupational Therapy Treatment Patient Details Name: Morgan [name] MRN: [id number] DOB: 1965/09/28 Today's Date: 08/01/2019    History of present illness 53yo female with increased L UE claudication symptoms and dizziness. Received L carotid-subclavian bypass graft 07/30/19, now c/o increased L UE numbness and weakness. PMH anxiety, bipolar disorder, HLD, HTN, migraine, NSTEMI s/p coronary stent, CVA, DM, thalassemia, L knee reconstruction   OT comments  Pt making steady progress towards OT goals this session.Pt overall requires supervision- min guard for functional mobility and BADLs with no AD. Pt completed full shower needing supervision for safety and min guard for functional mobility from shower with no AD as pt reports feeling "shaky." Pt complete UB/LB dressing from EOB needing MIN A for LB dressing to lace shoes, supervision for UB dressing needing cues for compensatory method of dressing LUE first. Pt returned demo'ed LUE HEP as indicated below with good carrryover. Pt reports that she feels like her LUE strength has improved. Agree with OTR DC recs if pts son can be available at time of DC as pt reports her son is sometimes "gone for months." Pt would need 24 hour assist initially but reports she gets groceries online and feels she can manage on her own mostly.  Will continue to follow acutely per POC.    Follow Up Recommendations  Outpatient OT;Supervision/Assistance - 24 hour;Home health OT;Other (comment)(outpt vs HH initially pending ability to get to outpt)    Equipment Recommendations  Tub/shower seat    Recommendations for Other Services      Precautions / Restrictions Precautions Precautions: Fall;Other (comment) Precaution Comments: very numb/weak L UE Restrictions Weight Bearing Restrictions: No       Mobility Bed Mobility               General bed mobility comments: sitting EOB upon arrival  Transfers Overall transfer level: Needs assistance Equipment  used: None Transfers: Sit to/from Stand Sit to Stand: Min guard;Supervision         General transfer comment: close mingaurd for safety and balance, pt reports feeling "shaky" when getting out ot shower, supervision for sit>stand from EOB with no AD    Balance Overall balance assessment: Needs assistance Sitting-balance support: Feet supported Sitting balance-Leahy Scale: Good     Standing balance support: Single extremity supported;No upper extremity supported;During functional activity Standing balance-Leahy Scale: Fair Standing balance comment: ablel to complete functional task with supervision                           ADL either performed or assessed with clinical judgement   ADL Overall ADL's : Needs assistance/impaired     Grooming: Brushing hair;Standing;Set up;Supervision/safety   Upper Body Bathing: Supervision/ safety;Sitting   Lower Body Bathing: Supervison/ safety;Sit to/from stand   Upper Body Dressing : Cueing for compensatory techniques;Supervision/safety;Sitting Upper Body Dressing Details (indicate cue type and reason): cues for compensatory strategy to dress LUE before RUE Lower Body Dressing: Minimal assistance;Sit to/from stand Lower Body Dressing Details (indicate cue type and reason): MIN A to manipulate shoe laces Toilet Transfer: Min guard;Ambulation;Grab Information systems manager Details (indicate cue type and reason): min guard for safety with no AD, use of grab bars noted Toileting- Clothing Manipulation and Hygiene: Supervision/safety;Sit to/from stand   Tub/ Shower Transfer: Min guard;Walk-in shower;Ambulation;3 in 1 Tub/Shower Transfer Details (indicate cue type and reason): min guard for safety Functional mobility during ADLs: Min guard;Supervision/safety General ADL Comments: session focus on full shower, LB/UB  dressing, LUE therex and standing grooming tasks     Vision Baseline Vision/History: No visual deficits      Perception     Praxis      Cognition Arousal/Alertness: Awake/alert Behavior During Therapy: Flat affect Overall Cognitive Status: Within Functional Limits for tasks assessed                                          Exercises General Exercises - Upper Extremity Wrist Flexion: AROM;Left;10 reps Wrist Extension: AROM;Left;10 reps Digit Composite Flexion: AROM;Left;10 reps Composite Extension: AROM;Left;10 reps Hand Exercises Forearm Supination: AROM;Left;10 reps Forearm Pronation: AROM;Left;10 reps Digit Composite Abduction: AROM;Left;10 reps Digit Composite Adduction: AROM;Left;10 reps Opposition: AROM;Both;Seated   Shoulder Instructions       General Comments      Pertinent Vitals/ Pain       Pain Assessment: Faces Faces Pain Scale: Hurts little more Pain Location: LUE with movement Pain Descriptors / Indicators: Aching;Sore;Grimacing;Discomfort Pain Intervention(s): Monitored during session;Repositioned;RN gave pain meds during session  Home Living                                          Prior Functioning/Environment              Frequency  Min 2X/week        Progress Toward Goals  OT Goals(current goals can now be found in the care plan section)  Progress towards OT goals: Progressing toward goals  Acute Rehab OT Goals Patient Stated Goal: get function back in L UE OT Goal Formulation: With patient Time For Goal Achievement: 08/14/19 Potential to Achieve Goals: Good  Plan Discharge plan remains appropriate    Co-evaluation                 AM-PAC OT "6 Clicks" Daily Activity     Outcome Measure   Help from another person eating meals?: A Little Help from another person taking care of personal grooming?: A Little Help from another person toileting, which includes using toliet, bedpan, or urinal?: A Little Help from another person bathing (including washing, rinsing, drying)?: A Little Help from  another person to put on and taking off regular upper body clothing?: A Little Help from another person to put on and taking off regular lower body clothing?: A Little 6 Click Score: 18    End of Session    OT Visit Diagnosis: Unsteadiness on feet (R26.81);Muscle weakness (generalized) (M62.81);Pain Pain - part of body: Shoulder;Arm   Activity Tolerance Patient tolerated treatment well   Patient Left in chair;Other (comment)(in chair eating breakfast with RT entering)   Nurse Communication          Time: [id number] OT Time Calculation (min): 36 min  Charges: OT General Charges $OT Visit: 1 Visit OT Treatments $Self Care/Home Management : 23-37 mins  Lanier Clam., COTA/L Acute Rehabilitation Services [phone] [place] 08/01/2019, 10:12 AM

## 2019-08-02 NOTE — TOC Transition Note (Signed)
Transition of Care [facility]) - CM/SW Discharge Note Marvetta Gibbons RN, BSN Transitions of Care Unit 4E- RN Case Manager [phone]   Patient Details  Name: Morgan [name] MRN: [id number] Date of Birth: 06/08/1966  Transition of Care [facility]) CM/SW Contact:  Dawayne Patricia, RN Phone Number: 08/02/2019, 11:48 AM   Clinical Narrative:    Pt stable for transition home, orders placed for HHPT/OT, CM spoke with pt at bedside- list provided Per CMS guidelines from medicare.gov website with star ratings (copy placed in shadow chart)- for [facility] choice- per pt she does not have a preference and CM will reach out to [facility] agencies to find one to provide services. Pt calling Lyft services for transport home.   Calls made to [facility]  for [facility] referral- unable to accept [facility]- unable to accept [facility]-unable to accept [facility]- Out of network [facility]- unable to accept [facility]- Unable to accept [facility]- has accepted referral-    Post discharge- call made to [place] this am 12/31 to see if they could accept [facility] referral- spoke with Mirian Capuchin can accept however due to holiday staffing - start of care will be 1/8 next week- call made to pt to let her know [place] arrangements.   Final next level of care: [place] Barriers to Discharge: No Barriers Identified   Patient Goals and CMS Choice Patient states their goals for this hospitalization and ongoing recovery are:: return home CMS Medicare.gov Compare Post Acute Care list provided to:: Patient Choice offered to / list presented to : Patient  Discharge Placement               Home with [facility]        Discharge Plan and Services   Discharge Planning Services: CM Consult Post Acute Care Choice: Home Health          DME Arranged: N/A DME Agency: NA       HH Arranged: PT, OT [facility] Agency: [place] Date [place]: 08/02/19 Time HH Agency Contacted: 1000 Representative spoke with at [place]: [place] ([place]) Interventions     Readmission Risk Interventions Readmission Risk Prevention Plan 10/29/2018  Transportation Screening Complete  PCP or Specialist Appt within 3-5 Days Complete  HRI or [place] Complete  Social Work Consult for [place] Planning/Counseling Complete  Palliative Care Screening Not Applicable  Medication Review Press photographer) Complete  Some recent data might be hidden

## 2019-08-04 ENCOUNTER — Encounter: Payer: Self-pay | Admitting: Internal Medicine

## 2019-08-04 LAB — TYPE AND SCREEN
ABO/RH(D): O POS
Antibody Screen: POSITIVE
Donor AG Type: NEGATIVE
Donor AG Type: NEGATIVE
PT AG Type: NEGATIVE
Unit division: 0
Unit division: 0

## 2019-08-04 LAB — BPAM RBC
Blood Product Expiration Date: 202101092359
Blood Product Expiration Date: 202101192359
Unit Type and Rh: 5100
Unit Type and Rh: 5100

## 2019-08-13 ENCOUNTER — Telehealth: Payer: Self-pay | Admitting: *Deleted

## 2019-08-13 NOTE — Telephone Encounter (Signed)
Received VM from pt to "check to see if her appt can wait until tomorrow".  Attempted to call back, no answer and no machine.  Christen Bame, CMA

## 2019-08-14 ENCOUNTER — Emergency Department (HOSPITAL_COMMUNITY)
Admission: EM | Admit: 2019-08-14 | Discharge: 2019-08-15 | Disposition: A | Discharge status: Home / Self Care | Payer: 59 | Attending: Emergency Medicine | Admitting: Emergency Medicine

## 2019-08-14 ENCOUNTER — Other Ambulatory Visit: Payer: Self-pay

## 2019-08-14 ENCOUNTER — Encounter: Payer: Self-pay | Admitting: Family Medicine

## 2019-08-14 ENCOUNTER — Ambulatory Visit (INDEPENDENT_AMBULATORY_CARE_PROVIDER_SITE_OTHER): Payer: 59 | Admitting: Family Medicine

## 2019-08-14 ENCOUNTER — Encounter (HOSPITAL_COMMUNITY): Payer: Self-pay | Admitting: Emergency Medicine

## 2019-08-14 VITALS — HR 87 | Wt 212.4 lb

## 2019-08-14 DIAGNOSIS — Z8673 Personal history of transient ischemic attack (TIA), and cerebral infarction without residual deficits: Secondary | ICD-10-CM | POA: Insufficient documentation

## 2019-08-14 DIAGNOSIS — Z87891 Personal history of nicotine dependence: Secondary | ICD-10-CM | POA: Diagnosis not present

## 2019-08-14 DIAGNOSIS — Z794 Long term (current) use of insulin: Secondary | ICD-10-CM | POA: Diagnosis not present

## 2019-08-14 DIAGNOSIS — E039 Hypothyroidism, unspecified: Secondary | ICD-10-CM | POA: Insufficient documentation

## 2019-08-14 DIAGNOSIS — I10 Essential (primary) hypertension: Secondary | ICD-10-CM | POA: Insufficient documentation

## 2019-08-14 DIAGNOSIS — E1159 Type 2 diabetes mellitus with other circulatory complications: Secondary | ICD-10-CM

## 2019-08-14 DIAGNOSIS — R200 Anesthesia of skin: Secondary | ICD-10-CM | POA: Diagnosis present

## 2019-08-14 DIAGNOSIS — E1165 Type 2 diabetes mellitus with hyperglycemia: Secondary | ICD-10-CM | POA: Insufficient documentation

## 2019-08-14 DIAGNOSIS — R739 Hyperglycemia, unspecified: Secondary | ICD-10-CM

## 2019-08-14 LAB — CBG MONITORING, ED
Glucose-Capillary: >600 mg/dL (ref 70–99)
Glucose-Capillary: 565 mg/dL (ref 70–99)

## 2019-08-14 LAB — CBC
HCT: 45 % (ref 36.0–46.0)
Hemoglobin: 13.3 g/dL (ref 12.0–15.0)
MCH: 19.9 pg — ABNORMAL LOW (ref 26.0–34.0)
MCHC: 29.6 g/dL — ABNORMAL LOW (ref 30.0–36.0)
MCV: 67.4 fL — ABNORMAL LOW (ref 80.0–100.0)
Platelets: 433 10*3/uL — ABNORMAL HIGH (ref 150–400)
RBC: 6.68 MIL/uL — ABNORMAL HIGH (ref 3.87–5.11)
RDW: 18.5 % — ABNORMAL HIGH (ref 11.5–15.5)
WBC: 10.2 10*3/uL (ref 4.0–10.5)
nRBC: 0 % (ref 0.0–0.2)

## 2019-08-14 LAB — BASIC METABOLIC PANEL
Anion gap: 15 (ref 5–15)
BUN: 10 mg/dL (ref 6–20)
CO2: 23 mmol/L (ref 22–32)
Calcium: 9.2 mg/dL (ref 8.9–10.3)
Chloride: 92 mmol/L — ABNORMAL LOW (ref 98–111)
Creatinine, Ser: 0.98 mg/dL (ref 0.44–1.00)
GFR calc Af Amer: >60 mL/min (ref >60)
GFR calc non Af Amer: >60 mL/min (ref >60)
Glucose, Bld: 781 mg/dL (ref 70–99)
Potassium: 4.1 mmol/L (ref 3.5–5.1)
Sodium: 130 mmol/L — ABNORMAL LOW (ref 135–145)

## 2019-08-14 LAB — POCT CBG (FASTING - GLUCOSE)-MANUAL ENTRY: Glucose Fasting, POC: >600 mg/dL (ref 70–99)

## 2019-08-14 NOTE — Assessment & Plan Note (Addendum)
S/p left common carotid artery to subclavian artery bypass on 12/28 by [facility]. However after 2 weeks patient is still experiencing numbness, tingling and weakness of bilateral hands from the wrist down, sparing the pinky. Patient also with prior history of stroke. -Patient may benefit from MRI with and without contrast of cervical spine to rule out cause of this continued bilateral hand numbness/tingling. (last Cr 0.6 on 07/31/19)  Modified [employer] scoring system for severity of cervical myelopathy with score of 14 which is moderate.

## 2019-08-14 NOTE — ED Notes (Signed)
Date and time results received: 08/14/19 16:59 (use smartphrase ".now" to insert current time)  Test: glucose Critical Value: 781 Name of Provider Notified: Dr. Darl Householder  Orders Received? Or Actions Taken?: Per EDP room soon

## 2019-08-14 NOTE — Progress Notes (Signed)
Subjective:  Patient ID: Morgan [name]  DOB: 10-14-1965 MRN: [id number]  Morgan [name] is a 54 y.o. female with a PMH of h/o CVA, poorly controlled T2DM, hypothyroidism, bipolar, hyperlipidemia, h/o QTC prolongation, HFpEF, hypertension, CAD, PAD here today for bilateral hand numbness and tingling after surgery.   HPI:  Bilateral hand numbness and tingling: - The patient was admitted to the hospital on 07/30/2019 and underwent left common carotid arteryto subclavian artery bypass (7 mmDacron graft) and placement of left neck 10 French flat drain under the scalene fat pad. -Patient reports that after her surgery she was experiencing numbness in both arms starting from her shoulder.  She states that the day after her surgery that the numbness improved and was only happening from her wrist down to bilaterally. -She states that has slowly improved and that she can now has some feeling inability to move her hands.  She states that the feeling has only returned in her pinky finger however.  -She does state that she has had a feeling that her hands are swollen and due to this she has been unable to give herself insulin since last Thursday.   -Patient with no prior history of neuropathy in lower or upper extremities -Patient denies any trauma. -Patient reports that she has not had any recent falls.  She states that her gait has not been affected.  She denies any numbness or tingling in any lower extremities.  T2DM with hyperglycemia: -Patient states that she has not been able to check her blood sugar or use insulin as above since last Thursday.  She was previously on 35 units of Lantus twice daily with 25 units of Humalog 3 times per day. -Admitted for DKA in 05/2019 -Patient has been trying to get her blood sugars under more aggressive control prior to her surgery -She reports that over the past few days she has been experiencing increased thirst as well as increased urinary frequency.  She states  that today she has started to feel nauseous.  She has still been able to eat but is struggling to hold her food. -She states her son lives at home with her but has not been able to give her insulin either and she has asked him multiple times. -She does not believe that if she goes home that she will be able to receive her insulin  ROS: As mentioned in HPI  Social hx: Denies use of illicit drugs, alcohol use Smoking status reviewed  Patient Active Problem List   Diagnosis Date Noted  . Bilateral hand numbness 08/14/2019  . Status post carotid endarterectomy 07/30/2019  . Subclavian artery stenosis, left ([place]) 07/30/2019  . 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 Migraines 07/16/2019  . Stenosis of left subclavian artery ([place]) 06/05/2019  . Vitamin D deficiency 05/19/2019  . Elevated serum hCG 05/18/2019  . Chronic nonintractable headache   . Diabetes mellitus ([place]) 11/17/2018  . Type 2 diabetes mellitus with hyperglycemia, with long-term current use of insulin ([place]) 11/17/2018  . S/P coronary artery stent placement   . Acute systolic heart failure ([place])   . Hyperlipidemia LDL goal <70   . Esophagitis determined by endoscopy   . Dysphagia 07/24/2018  . Odynophagia 07/23/2018  . Orthostatic hypotension 07/22/2018  . Tachycardia 07/22/2018  . Hepatic steatosis   . Elevated transaminase level 07/21/2018  . Mild renal insufficiency 07/21/2018  . Bipolar depression ([place]) 07/21/2018  . Incidental lung nodule 07/21/2018  . Extravasation accident, initial encounter 07/21/2018  . Prolonged  QT interval 07/21/2018  . LFTs abnormal   . Hyperlipidemia 05/14/2016  . Hypothyroidism 05/14/2016  . Essential hypertension 05/14/2016     Objective:  Pulse 87   Wt 212 lb 6 oz (96.3 kg)   LMP 08/03/2013   SpO2 96%   BMI 37.62 kg/m   Vitals and nursing note reviewed  General: NAD, pleasant Pulm: normal effort Extremities: no edema or cyanosis. WWP. BLUE with decreased sensation of both  hands.  Strength 3 out of 5 in bilateral hands, however may have been poor effort as well.  No associated redness or swelling noted in bilateral upper extremities.  Reflexes 1+ and upper and lower extremities. Skin: warm and dry, no rashes noted Neuro: alert and oriented, no focal deficits Psych: normal affect, normal thought content  Assessment & Plan:   Type 2 diabetes mellitus with hyperglycemia, with long-term current use of insulin ([place]) Patient has not received any insulin since 01/07.  She states that after her surgery she has had numbness and tingling in her hands.  CBG in our office >600.  Patient endorses some nausea and increased thirst and increased urination.  Patient was hospitalized for 4 days in October 2020 for DKA and she feels as if she is headed that way again. -Given that she cannot safely administer insulin at her home or has any family member who will administer her insulin then we do believe that patient needs to be evaluated in the ED to receive insulin and have her electrolytes checked along with receiving IV fluids. -Patient counseled that during Covid pandemic she may have longer wait times in the ED and she voiced understanding of this plan, however she is worried that she may go back into DKA as she previously did  Bilateral hand numbness S/p left common carotid artery to subclavian artery bypass on 12/28 by [facility]. However after 2 weeks patient is still experiencing numbness, tingling and weakness of bilateral hands from the wrist down, sparing the pinky. Patient also with prior history of stroke. -Patient may benefit from MRI with and without contrast of cervical spine to rule out cause of this continued bilateral hand numbness/tingling. (last Cr 0.6 on 07/31/19)  Modified [employer] scoring system for severity of cervical myelopathy with score of 14 which is moderate.    [place] Chalee Hirota, DO Family Medicine Resident PGY-3

## 2019-08-14 NOTE — Assessment & Plan Note (Signed)
Patient has not received any insulin since 01/07.  She states that after her surgery she has had numbness and tingling in her hands.  CBG in our office >600.  Patient endorses some nausea and increased thirst and increased urination.  Patient was hospitalized for 4 days in October 2020 for DKA and she feels as if she is headed that way again. -Given that she cannot safely administer insulin at her home or has any family member who will administer her insulin then we do believe that patient needs to be evaluated in the ED to receive insulin and have her electrolytes checked along with receiving IV fluids. -Patient counseled that during Covid pandemic she may have longer wait times in the ED and she voiced understanding of this plan, however she is worried that she may go back into DKA as she previously did

## 2019-08-14 NOTE — ED Triage Notes (Signed)
Pt went to PCP for "tingling hands", was told CBG >600, pt hasn't been able to draw up insulin because of her hands.

## 2019-08-14 NOTE — ED Notes (Signed)
Pt seen getting Soda and candy bar out of vending, pt warned not to eat that due to blood sugar level, pt then responded with "I dont care".

## 2019-08-15 ENCOUNTER — Emergency Department (HOSPITAL_COMMUNITY): Payer: 59

## 2019-08-15 LAB — CBG MONITORING, ED
Glucose-Capillary: 555 mg/dL (ref 70–99)
Glucose-Capillary: 505 mg/dL (ref 70–99)
Glucose-Capillary: 388 mg/dL — ABNORMAL HIGH (ref 70–99)

## 2019-08-15 MED ORDER — ACETAMINOPHEN 325 MG PO TABS
650.0000 mg | ORAL_TABLET | Freq: Once | ORAL | Status: AC
  Administered 2019-08-15: 09:00:00 650 mg via ORAL
  Filled 2019-08-15: qty 2

## 2019-08-15 MED ORDER — GADOBUTROL 1 MMOL/ML IV SOLN
9.0000 mL | Freq: Once | INTRAVENOUS | Status: AC | PRN
  Administered 2019-08-15: 09:00:00 9 mL via INTRAVENOUS

## 2019-08-15 MED ORDER — INSULIN ASPART 100 UNIT/ML ~~LOC~~ SOLN
10.0000 [IU] | Freq: Once | SUBCUTANEOUS | Status: AC
  Administered 2019-08-15: 10 [IU] via INTRAVENOUS

## 2019-08-15 MED ORDER — SODIUM CHLORIDE 0.9 % IV BOLUS
1000.0000 mL | Freq: Once | INTRAVENOUS | Status: AC
  Administered 2019-08-15: 07:00:00 1000 mL via INTRAVENOUS

## 2019-08-15 NOTE — ED Provider Notes (Signed)
Patient arrives with high blood sugar.  She has had difficulty taking her insulin over the last couple weeks due to tingling in her hands.  She recently had carotid to subclavian bypass and has had these paresthesias down her arms.  It has made her have a hard time using her insulin.  Blood sugar has improved with fluids and insulin here.  She is not in DKA.  MRA and MRI of the spine has been ordered.  Paresthesias appear to be involving first through fourth fingers.  She does not appear to have help at home to help with given her insulin.  She may need an insulin pump.  Overall MRI and MRA of her cervical spine is unremarkable.  Her bypass is patent.  There is no pseudoaneurysm.  There is some soft tissue infiltration close to her brachial plexus on the left side however there is no obvious bleeding.  She does not appear to have any infectious process at her surgical site.  Overall image is unremarkable.  Even if there was some inflammation associated with the brachial plexus it does not fit with her bilateral symptoms.  I talked with neurology on the phone and overall there is no obvious central cause for her bilateral symptoms.  She has some paresthesias intensely in the median nerve distribution below her wrist on exam bilaterally.  Overall she states that after her surgery she had these feelings down bilateral arms from her shoulder down and over the course of time the symptoms have improved to just include the hands.  I was able to observe the patient successively give herself insulin.  She was able to adjust and successfully provide herself insulin.  I suspect that these are peripheral nerve issues possibly.  However there is an aspect of possibly a functional disorder as well as she did appear to be able to use her hands fairly well.  Overall it appears that symptoms have been gradually improving.  She does work with her hands a lot typing.  At this time recommend conservative treatment with Tylenol and  close follow-up with neurology for nerve conduction studies.  She has follow-up with vascular surgery in place.  Discharged from the emergency department in good condition.  Given return precautions.  This chart was dictated using voice recognition software.  Despite best efforts to proofread,  errors can occur which can change the documentation meaning.    Lennice Sites, DO 08/15/19 1132

## 2019-08-15 NOTE — Discharge Planning (Signed)
Pt currently active with [facility] for [facility] services.

## 2019-08-15 NOTE — ED Notes (Signed)
Took out saline lock patient is getting dressed

## 2019-08-15 NOTE — Discharge Instructions (Addendum)
I have put in a referral to neurology for you however call to confirm an appointment.  Follow-up with your primary care doctor to discuss alternative treatment options for your diabetes until you can have nerve conduction studies done to your arms.  As stated before overall there does not appear to be a vascular issue or cervical spine issue causing her symptoms today.

## 2019-08-15 NOTE — ED Notes (Signed)
Pt transported to MRI 

## 2019-08-15 NOTE — ED Provider Notes (Signed)
Emergency Department Provider Note   I have reviewed the triage vital signs and the nursing notes.   HISTORY  Chief Complaint No chief complaint on file.   HPI Morgan [name] is a 54 y.o. female who presents to the emergency department today secondary to hyperglycemia.  Patient states that she had a carotid subclavian bypass done at the end of December.  She stated right as soon as she woke up she had paresthesias from her shoulders to her hands.  This quickly improved but for the last week and a half she has had paresthesias from her wrist into her thumb, pointer finger, middle finger and ring finger on both hands.  She states that this has not changed.  Does not fluctuate.  She has no other symptoms but because of this she is unable to do many things with his hands that require fine motor movements.  She is able to use and feel in her pinky but she is unable to give herself her insulin as her blood sugar has been rising she went to see her PCP today and it was too high to read so they were sent here  for further evaluation and management.  Patient has no other neurologic changes.  No recent illnesses.  No other associated or modifying symptoms.    Past Medical History:  Diagnosis Date  . Anxiety   . Asthma   . Bipolar depression ([place])   . Chronic bronchitis ([place])   . Chronic esophagogastric ulcer   . Chronic stomach ulcer   . Fibroid   . GERD (gastroesophageal reflux disease)   . Heart murmur   . Hepatic steatosis   . History of blood transfusion 2010   "related to subclavian stent"  . History of hiatal hernia   . Hyperlipidemia   . Hypertension   . Hypothyroid   . Migraine    "cerebral migraines; 1-2/month" (06/29/2018)  . Non-ST elevation (NSTEMI) myocardial infarction ([place]) 10/27/2018  . 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 Stroke [facility])    "I've had 2; most recent one was ~ 2015 or before; affected balance"  (06/29/2018)  . Thalassemia minor   . Type II diabetes mellitus ([place])   . Vitamin D deficiency     Patient Active Problem List   Diagnosis Date Noted  . Bilateral hand numbness 08/14/2019  . Status post carotid endarterectomy 07/30/2019  . Subclavian artery stenosis, left ([place]) 07/30/2019  . 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 Migraines 07/16/2019  . Stenosis of left subclavian artery ([place]) 06/05/2019  . Vitamin D deficiency 05/19/2019  . Elevated serum hCG 05/18/2019  . Chronic nonintractable headache   . Diabetes mellitus ([place]) 11/17/2018  . Type 2 diabetes mellitus with hyperglycemia, with long-term current use of insulin ([place]) 11/17/2018  . S/P coronary artery stent placement   . Acute systolic heart failure ([place])   . Hyperlipidemia LDL goal <70   . Esophagitis determined by endoscopy   . Dysphagia 07/24/2018  . Odynophagia 07/23/2018  . Orthostatic hypotension 07/22/2018  . Tachycardia 07/22/2018  . Hepatic steatosis   . Elevated transaminase level 07/21/2018  . Mild renal insufficiency 07/21/2018  . Bipolar depression ([place]) 07/21/2018  . Incidental lung nodule 07/21/2018  . Extravasation accident, initial encounter 07/21/2018  . Prolonged QT interval 07/21/2018  . LFTs abnormal   . Hyperlipidemia 05/14/2016  . Hypothyroidism 05/14/2016  . Essential hypertension 05/14/2016    Past Surgical History:  Procedure Laterality Date  . ABDOMINAL HYSTERECTOMY  2015   "still have 1 ovary"  . AORTIC ARCH ANGIOGRAPHY  06/14/2019  . AORTIC ARCH ANGIOGRAPHY N/A 06/14/2019   Procedure: AORTIC ARCH ANGIOGRAPHY;  Surgeon: Lorretta Harp, MD;  Location: [place] [facility];  Service: Cardiovascular;  Laterality: N/A;  . BIOPSY  07/25/2018   Procedure: BIOPSY;  Surgeon: Thornton Park, MD;  Location: [facility] ENDOSCOPY;  Service: Gastroenterology;;  . CAROTID-SUBCLAVIAN BYPASS GRAFT Left 07/30/2019   Procedure: LEFT CAROTID-SUBCLAVIAN BYPASS GRAFT;  Surgeon: Marty Heck, MD;  Location: [place];  Service: Vascular;  Laterality: Left;  . CESAREAN SECTION  1988; 1996  . CORONARY STENT INTERVENTION N/A 10/27/2018   Procedure: CORONARY STENT INTERVENTION;  Surgeon: Sherren Mocha, MD;  Location: [place] [facility];  Service: Cardiovascular;  Laterality: N/A;  . ESOPHAGOGASTRODUODENOSCOPY (EGD) WITH PROPOFOL N/A 07/25/2018   Procedure: ESOPHAGOGASTRODUODENOSCOPY (EGD) WITH PROPOFOL;  Surgeon: Thornton Park, MD;  Location: [place];  Service: Gastroenterology;  Laterality: N/A;  . KNEE RECONSTRUCTION Left 1980  . LAPAROSCOPIC CHOLECYSTECTOMY    . LEFT HEART CATH AND CORONARY ANGIOGRAPHY N/A 10/27/2018   Procedure: LEFT HEART CATH AND CORONARY ANGIOGRAPHY;  Surgeon: Sherren Mocha, MD;  Location: [place] [facility];  Service: Cardiovascular;  Laterality: N/A;  . SUBCLAVIAN STENT PLACEMENT Left 2010  . TONSILLECTOMY  1970  . UPPER EXTREMITY ANGIOGRAPHY Right 06/14/2019   Procedure: UPPER EXTREMITY ANGIOGRAPHY;  Surgeon: Lorretta Harp, MD;  Location: [place] [facility];  Service: Cardiovascular;  Laterality: Right;  upper ext    Current Outpatient Rx  . Order #: [id number] Class: Normal  . Order #: [id number] Class: Historical Med  . Order #: [id number] Class: Print  . Order #: [id number] Class: Historical Med  . Order #: [id number] Class: Normal  . Order #: [id number] Class: Historical Med  . Order #: [id number] Class: Historical Med  . Order #: [id number] Class: Historical Med  . Order #: [id number] Class: Historical Med  . Order #: [id number] Class: Normal  . Order #: [id number] Class: Historical Med  . Order #: [id number] Class: Normal  . Order #: [id number] Class: Normal  . Order #: [id number] Class: Normal  . Order #: [id number] Class: Normal  . Order #: [id number] Class: Normal  . Order #: [id number] Class: Historical Med  . Order #: [id number] Class: Historical Med  . Order #: [id number] Class: Normal  . Order #: [id number] Class: Historical Med  . Order #: [id number] Class:  Normal  . Order #: [id number] Class: Historical Med  . Order #: [id number] Class: Normal  . Order #: [id number] Class: Normal  . Order #: [id number] Class: Historical Med  . Order #: [id number] Class: Print  . Order #: [id number] Class: Normal  . Order #: [id number] Class: Normal  . Order #: [id number] Class: Normal  . Order #: [id number] Class: Normal  . Order #: [id number] Class: Historical Med    Allergies Ilosone [erythromycin], Keflex [cephalexin], Penicillins, Iron, Metformin and related, and Morphine and related  Family History  Problem Relation Age of Onset  . Diabetes Mother   . Hypertension Mother   . Hyperlipidemia Mother   . CAD Mother   . Cervical cancer Mother   . CAD Father   . Heart failure Father   . Testicular cancer Father   . Hypertension Sister   . Hyperlipidemia Sister   . Hypertension Brother   . Hyperlipidemia Brother     Social History Social History   Tobacco Use  . Smoking status: Former Smoker    Packs/day: 2.00    Years: 36.00    Pack years: 72.00    Types: Cigarettes  Quit date: 05/14/2014    Years since quitting: 5.2  . Smokeless tobacco: Never Used  Substance Use Topics  . Alcohol use: Not Currently  . Drug use: Not Currently    Review of Systems  All other systems negative except as documented in the HPI. All pertinent positives and negatives as reviewed in the HPI. ____________________________________________   PHYSICAL EXAM:  VITAL SIGNS: ED Triage Vitals  Enc Vitals Group     BP 08/14/19 1526 134/77     Pulse Rate 08/14/19 1526 83     Resp 08/14/19 1526 16     Temp 08/14/19 1526 98.7 F (37.1 C)     Temp Source 08/14/19 1526 Oral     SpO2 08/14/19 1526 97 %     Weight --      Height --      Head Circumference --      Peak Flow --      Pain Score 08/14/19 1523 0     Pain Loc --      Pain Edu? --      Excl. in [place]? --     Constitutional: Alert and oriented. Well appearing and in no acute distress. Eyes: Conjunctivae are  normal. PERRL. EOMI. Head: Atraumatic. Nose: No congestion/rhinnorhea. Mouth/Throat: Mucous membranes are moist.  Oropharynx non-erythematous. Neck: No stridor.  No meningeal signs.   Cardiovascular: Normal rate, regular rhythm. Good peripheral circulation. Grossly normal heart sounds.   Respiratory: Normal respiratory effort.  No retractions. Lungs CTAB. Gastrointestinal: Soft and nontender. No distention.  Musculoskeletal: No lower extremity tenderness nor edema. No gross deformities of extremities. Neurologic: Diminished grip strength in bilateral hands.  She has diminished sensation in bilateral hands but can feel sharp pain when squeezed tightly that shoots up her hand.  Her pinky is normal by lateral upper extremities.  Her arms are normal. Skin: Left neck wound is clean, warm, dry and intact. No rash noted.   ____________________________________________   LABS (all labs ordered are listed, but only abnormal results are displayed)  Labs Reviewed  BASIC METABOLIC PANEL - Abnormal; Notable for the following components:      Result Value   Sodium 130 (*)    Chloride 92 (*)    Glucose, Bld 781 (*)    All other components within normal limits  CBC - Abnormal; Notable for the following components:   RBC 6.68 (*)    MCV 67.4 (*)    MCH 19.9 (*)    MCHC 29.6 (*)    RDW 18.5 (*)    Platelets 433 (*)    All other components within normal limits  CBG MONITORING, ED - Abnormal; Notable for the following components:   Glucose-Capillary >600 (*)    All other components within normal limits  CBG MONITORING, ED - Abnormal; Notable for the following components:   Glucose-Capillary 565 (*)    All other components within normal limits  CBG MONITORING, ED - Abnormal; Notable for the following components:   Glucose-Capillary 555 (*)    All other components within normal limits  CBG MONITORING, ED - Abnormal; Notable for the following components:   Glucose-Capillary 505 (*)    All other  components within normal limits  URINALYSIS, ROUTINE W REFLEX MICROSCOPIC  BLOOD GAS, VENOUS  I-STAT BETA HCG BLOOD, ED ([facility], [facility], [facility] ONLY)   ____________________________________________  INITIAL IMPRESSION / ASSESSMENT AND PLAN / ED COURSE  Normal pulses low concern for any vascular causes for symptoms however the patient could have  a cervical radiculopathy that is causing this.  Could be more distal nerve however I would expect that it to have improved at this point.  Plan will be for MRI of her neck if this is normal then we will need to get case management social work involved to get help to the house to help her get her medications in.  If MRI is abnormal then may need to discuss with neurosurgery to see if there is any intervention that is appropriate. Will adjust blood sugar with fluids and insulin even though it is already improving without any intervention at all.  Suspect this will continue to improve.  No evidence of DKA.    Care transferred pending MRI, reevaluation and management as necessary.  Pertinent labs & imaging results that were available during my care of the patient were reviewed by me and considered in my medical decision making (see chart for details). ____________________________________________  FINAL CLINICAL IMPRESSION(S) / ED DIAGNOSES  Final diagnoses:  None     MEDICATIONS GIVEN DURING THIS VISIT:  Medications  sodium chloride 0.9 % bolus 1,000 mL (1,000 mLs Intravenous New Bag/Given 08/15/19 0638)  insulin aspart (novoLOG) injection 10 Units (10 Units Intravenous Given 08/15/19 0638)     NEW OUTPATIENT MEDICATIONS STARTED DURING THIS VISIT:  New Prescriptions   No medications on file    Note:  This note was prepared with assistance of Dragon voice recognition software. Occasional wrong-word or sound-a-like substitutions may have occurred due to the inherent limitations of voice recognition software.   Rusty Glodowski, Corene Cornea, MD 08/15/19 [phone]

## 2019-08-15 NOTE — ED Notes (Signed)
Pt returned from MRI °

## 2019-08-16 ENCOUNTER — Telehealth: Payer: Self-pay

## 2019-08-16 DIAGNOSIS — R200 Anesthesia of skin: Secondary | ICD-10-CM

## 2019-08-16 DIAGNOSIS — Z794 Long term (current) use of insulin: Secondary | ICD-10-CM

## 2019-08-16 DIAGNOSIS — E1165 Type 2 diabetes mellitus with hyperglycemia: Secondary | ICD-10-CM

## 2019-08-16 NOTE — Telephone Encounter (Signed)
Elta Guadeloupe from [facility] calls nurse line requesting verbal orders.  PT once/ week for 4 weeks to help with strength, balance and gait. Also requesting orders for OT evaluation to help with ADLs due to patient's neuropathy.  Elta Guadeloupe also reports that patient has abnormal vital signs BP: 160/90  HR: 100  Blood sugar continues to be elevated at 492. Pt is not currently having any symptoms  Called patient to check in. Pt states that she is feeling better after ED visit, however, continues to have issues with insulin administration. Educated patient on importance of administering insulin due to extremely high blood sugars and to prevent DKA. Pt verbalized understanding and states that she would like to make a plan to get her diabetes more under control. Will forward to PCP.   Talbot Grumbling, RN

## 2019-08-18 NOTE — Telephone Encounter (Signed)
Called and place verbal orders and will place referral for OT.

## 2019-08-21 ENCOUNTER — Encounter: Payer: Self-pay | Admitting: Vascular Surgery

## 2019-08-21 ENCOUNTER — Ambulatory Visit (INDEPENDENT_AMBULATORY_CARE_PROVIDER_SITE_OTHER): Payer: Self-pay | Admitting: Vascular Surgery

## 2019-08-21 ENCOUNTER — Other Ambulatory Visit: Payer: Self-pay

## 2019-08-21 VITALS — BP 151/88 | HR 79 | Temp 97.1°F | Resp 16 | Ht 63.0 in | Wt 214.0 lb

## 2019-08-21 DIAGNOSIS — G458 Other transient cerebral ischemic attacks and related syndromes: Secondary | ICD-10-CM

## 2019-08-21 MED ORDER — ATORVASTATIN CALCIUM 80 MG PO TABS: 80.0000 mg | ORAL_TABLET | Freq: Every day | ORAL | 11 refills | Status: DC

## 2019-08-21 MED ORDER — HYDROCODONE-ACETAMINOPHEN 5-325 MG PO TABS: 1.0000 | ORAL_TABLET | Freq: Four times a day (QID) | ORAL | 0 refills | Status: DC | PRN

## 2019-08-21 NOTE — Progress Notes (Signed)
Patient name: Morgan [name] MRN: [id number] DOB: Aug 28, 1965 Sex: female  REASON FOR VISIT: Postop check after left carotid subclavian bypass for subclavian steal  HPI: Morgan [name] is a 54 y.o. 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  Ultimately she did well in the hospital postop except for some difficulty abducting her left arm after surgery.  This has been improving according to her and she has now started occupational therapy.  Otherwise she feels like her hand is feeling better and some of the numbness is improving in the setting of bilateral hand numbness that may be neuropathy.  Sounds like no significant issues with arm fatigue since surgery.  No dizziness or other focal neurologic symptoms.  Her left neck incision has healed without issue.  No specific concerns today.  States she is not smoking.  She is on aspirin as well as a statin.  Past Medical History:  Diagnosis Date  . Anxiety   . Asthma   . Bipolar depression ([place])   . Chronic bronchitis ([place])   . Chronic esophagogastric ulcer   . Chronic stomach ulcer   . Fibroid   . GERD (gastroesophageal reflux disease)   . Heart murmur   . Hepatic steatosis   . History of blood transfusion 2010   "related to subclavian stent"  . History of hiatal hernia   . Hyperlipidemia   . Hypertension   . Hypothyroid   . Migraine    "cerebral migraines; 1-2/month" (06/29/2018)  . Non-ST elevation (NSTEMI) myocardial infarction ([place]) 10/27/2018  . 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 Stroke [facility])    "I've had 2; most recent one was ~ 2015 or before; affected balance" (06/29/2018)  . Thalassemia minor   . Type II diabetes mellitus ([place])   . Vitamin D deficiency     Past Surgical History:  Procedure Laterality Date  . ABDOMINAL HYSTERECTOMY  2015   "still  have 1 ovary"  . AORTIC ARCH ANGIOGRAPHY  06/14/2019  . AORTIC ARCH ANGIOGRAPHY N/A 06/14/2019   Procedure: AORTIC ARCH ANGIOGRAPHY;  Surgeon: Lorretta Harp, MD;  Location: [place] CV LAB;  Service: Cardiovascular;  Laterality: N/A;  . BIOPSY  07/25/2018   Procedure: BIOPSY;  Surgeon: Thornton Park, MD;  Location: [facility] ENDOSCOPY;  Service: Gastroenterology;;  . CAROTID-SUBCLAVIAN BYPASS GRAFT Left 07/30/2019   Procedure: LEFT CAROTID-SUBCLAVIAN BYPASS GRAFT;  Surgeon: Marty Heck, MD;  Location: [place];  Service: Vascular;  Laterality: Left;  . CESAREAN SECTION  1988; 1996  . CORONARY STENT INTERVENTION N/A 10/27/2018   Procedure: CORONARY STENT INTERVENTION;  Surgeon: Sherren Mocha, MD;  Location: [place] CV LAB;  Service: Cardiovascular;  Laterality: N/A;  . ESOPHAGOGASTRODUODENOSCOPY (EGD) WITH PROPOFOL N/A 07/25/2018   Procedure: ESOPHAGOGASTRODUODENOSCOPY (EGD) WITH PROPOFOL;  Surgeon: Thornton Park, MD;  Location: [place];  Service: Gastroenterology;  Laterality: N/A;  . KNEE RECONSTRUCTION Left 1980  . LAPAROSCOPIC CHOLECYSTECTOMY    . LEFT HEART CATH AND CORONARY ANGIOGRAPHY N/A 10/27/2018   Procedure: LEFT HEART CATH AND CORONARY ANGIOGRAPHY;  Surgeon: Sherren Mocha, MD;  Location: [place] CV LAB;  Service: Cardiovascular;  Laterality: N/A;  . SUBCLAVIAN STENT PLACEMENT Left 2010  . TONSILLECTOMY  1970  . UPPER EXTREMITY ANGIOGRAPHY Right 06/14/2019   Procedure: UPPER EXTREMITY ANGIOGRAPHY;  Surgeon: Lorretta Harp, MD;  Location: [place]  CV LAB;  Service: Cardiovascular;  Laterality: Right;  upper ext    Family History  Problem Relation Age of Onset  . Diabetes Mother   . Hypertension Mother   . Hyperlipidemia Mother   . CAD Mother   . Cervical cancer Mother   . CAD Father   . Heart failure Father   . Testicular cancer Father   . Hypertension Sister   . Hyperlipidemia Sister   . Hypertension Brother   . Hyperlipidemia Brother       SOCIAL HISTORY: Social History   Tobacco Use  . Smoking status: Former Smoker    Packs/day: 2.00    Years: 36.00    Pack years: 72.00    Types: Cigarettes    Quit date: 05/14/2014    Years since quitting: 5.2  . Smokeless tobacco: Never Used  Substance Use Topics  . Alcohol use: Not Currently    Allergies  Allergen Reactions  . Ilosone [Erythromycin] Anaphylaxis  . Keflex [Cephalexin] Anaphylaxis  . Penicillins Anaphylaxis    Has patient had a PCN reaction causing immediate rash, facial/tongue/throat swelling, SOB or lightheadedness with hypotension: YES Has patient had a PCN reaction causing severe rash involving mucus membranes or skin necrosis: NO Has patient had a PCN reaction that required hospitalizationNO Has patient had a PCN reaction occurring within the last 10 years: NO If all of the above answers are "NO", then may proceed with Cephalosporin use.  . Iron Other (See Comments)    Pt reports condition limiting Iron intake.  . Metformin And Related Other (See Comments)    UNSPECIFIED REACTION  Pt prefers to not take this medication Side effects  . Morphine And Related Other (See Comments)    Pt reports hallucinations    Current Outpatient Medications  Medication Sig Dispense Refill  . Accu-Chek FastClix Lancets MISC 1 Package by Does not apply route 3 (three) times daily. 150 each 3  . albuterol (VENTOLIN HFA) 108 (90 Base) MCG/ACT inhaler Inhale 2 puffs into the lungs 2 (two) times daily as needed for wheezing or shortness of breath.    [name] aspirin 81 MG tablet Take 1 tablet (81 mg total) by mouth daily. 30 tablet 1  . atorvastatin (LIPITOR) 80 MG tablet Take 80 mg by mouth at bedtime.    . Blood Glucose Monitoring Suppl (CONTOUR NEXT ONE) KIT 1 kit by Does not apply route as directed. 1 kit 0  . Cholecalciferol (VITAMIN D3) 25 MCG (1000 UT) CAPS Take 1,000-2,000 Units by mouth See admin instructions. Take 2 tablets by mouth once a day on Mon/Wed/Fri and take 1  tablet on Sun/Tues/Thurs/Sat    . FLUoxetine (PROZAC) 20 MG tablet Take 60 mg by mouth daily.     . fluticasone-salmeterol (ADVAIR HFA) 115-21 MCG/ACT inhaler Inhale 2 puffs into the lungs 2 (two) times daily.    . furosemide (LASIX) 20 MG tablet Take 1 tablet (20 mg total) by mouth 2 (two) times daily. 180 tablet 3  . GLUCAGON EMERGENCY 1 MG injection Inject 1 mg into the vein once as needed (FOR ONSET OF HYPOGLYCEMIA).   2  . glucose blood (CONTOUR NEXT TEST) test strip Use as instructed 400 each 12  . Insulin Glargine (LANTUS SOLOSTAR) 100 UNIT/ML Solostar Pen Inject 35 Units into the skin 2 (two) times daily. 21 mL 11  . Insulin Lispro (HUMALOG KWIKPEN) 200 UNIT/ML SOPN Inject 25 Units into the skin 3 (three) times daily. 15 mL 11  . Insulin Pen Needle (PEN  NEEDLES) 32G X 4 MM MISC 1 Device by Does not apply route 5 (five) times daily. 150 each 11  . lamoTRIgine (LAMICTAL) 200 MG tablet Take 200 mg by mouth daily.     [name] levothyroxine (SYNTHROID, LEVOTHROID) 150 MCG tablet Take 150 mcg by mouth at bedtime. 3:30 Am    . losartan (COZAAR) 100 MG tablet Take 1 tablet (100 mg total) by mouth daily. 90 tablet 3  . Lurasidone HCl (LATUDA) 60 MG TABS Take 60 mg by mouth at bedtime.     . metoprolol tartrate (LOPRESSOR) 50 MG tablet Take 1 tablet (50 mg total) by mouth 2 (two) times daily. 180 tablet 3  . Multiple Vitamin (MULTIVITAMIN WITH MINERALS) TABS tablet Take 1 tablet by mouth daily. Woman 50 +    . nitroGLYCERIN (NITROSTAT) 0.4 MG SL tablet Place 1 tablet (0.4 mg total) under the tongue every 5 (five) minutes x 3 doses as needed for chest pain. 30 tablet 12  . nystatin (MYCOSTATIN/NYSTOP) powder Apply topically as needed. (Patient taking differently: Apply 1 g topically daily as needed (infection). ) 15 g 0  . nystatin ointment (MYCOSTATIN) Apply 1 application topically daily as needed (infection).    . pantoprazole (PROTONIX) 40 MG tablet Take 1 tablet (40 mg total) by mouth 2 (two) times  daily before a meal. X 8 weeks (Patient taking differently: Take 20 mg by mouth daily. ) 60 tablet 3  . potassium chloride (KLOR-CON) 10 MEQ tablet Take 10 mEq by mouth 2 (two) times daily.    . potassium chloride SA (KLOR-CON) 20 MEQ tablet Take 1 tablet (20 mEq total) by mouth 2 (two) times daily. 10 tablet 0  . ticagrelor (BRILINTA) 90 MG TABS tablet Take 1 tablet (90 mg total) by mouth 2 (two) times daily. 180 tablet 3  . topiramate (TOPAMAX) 50 MG tablet Take 1 tablet (50 mg total) by mouth at bedtime. 30 tablet 1  . topiramate (TOPAMAX) 50 MG tablet Take 1 tablet (50 mg total) by mouth at bedtime. 30 tablet 1  . traMADol (ULTRAM) 50 MG tablet Take 50 mg by mouth every 6 (six) hours as needed for moderate pain (Migraine).    [name] EPINEPHrine 0.3 mg/0.3 mL IJ SOAJ injection Inject 0.3 mg into the muscle once as needed for anaphylaxis (for an anaphylactic reaction).   1  . HYDROcodone-acetaminophen (NORCO) 5-325 MG tablet Take 1 tablet by mouth every 6 (six) hours as needed for severe pain. 20 tablet 0   No current facility-administered medications for this visit.    REVIEW OF SYSTEMS:  _0  denotes positive finding, _1  denotes negative finding Cardiac  Comments:  Chest pain or chest pressure:    Shortness of breath upon exertion:    Short of breath when lying flat:    Irregular heart rhythm:        Vascular    Pain in calf, thigh, or hip brought on by ambulation:    Pain in feet at night that wakes you up from your sleep:     Blood clot in your veins:    Leg swelling:         Pulmonary    Oxygen at home:    Productive cough:     Wheezing:         Neurologic    Sudden weakness in arms or legs:     Sudden numbness in arms or legs:     Sudden onset of difficulty speaking or slurred speech:  Temporary loss of vision in one eye:     Problems with dizziness:         Gastrointestinal    Blood in stool:     Vomited blood:         Genitourinary    Burning when urinating:       Blood in urine:        Psychiatric    Major depression:         Hematologic    Bleeding problems:    Problems with blood clotting too easily:        Skin    Rashes or ulcers:        Constitutional    Fever or chills:      PHYSICAL EXAM: Vitals:   08/21/19 0822 08/21/19 0826  BP: (!) 148/85 (!) 151/88  Pulse: 79 79  Resp: 16   Temp: (!) 97.1 F (36.2 C)   TempSrc: Temporal   SpO2: 97%   Weight: 214 lb (97.1 kg)   Height: _0  (1.6 m)     GENERAL: The patient is a well-nourished female, in no acute distress. The vital signs are documented above. CARDIAC: There is a regular rate and rhythm.  VASCULAR:  Left supraclavicular incision well-healed with no redness or drainage. Left radial pulse easily palpable. Improved abduction of the left arm and intact grip strength bilaterally.  DATA:   None  Assessment/Plan:  54 year old female status post left carotid subclavian bypass on 07/30/2019 for subclavian steal.  Her supraclavicular incision has healed nicely and she has a very nice palpable radial pulse on the left.  She had some issues with abduction of her left arm after surgery but that is improving and her motion is much better on exam today.  She is in occupational therapy and recommend she continue therapy until released.  I recommended aspirin statin and I will plan to see her back in 6 months with carotid and left upper extremity arterial duplex for surveillance of her bypass.  Call with questions or concerns.   Marty Heck, MD [facility] Office: [phone]

## 2019-08-23 ENCOUNTER — Other Ambulatory Visit: Payer: Self-pay | Admitting: *Deleted

## 2019-08-23 DIAGNOSIS — Z9889 Other specified postprocedural states: Secondary | ICD-10-CM

## 2019-08-23 DIAGNOSIS — G458 Other transient cerebral ischemic attacks and related syndromes: Secondary | ICD-10-CM

## 2019-08-25 ENCOUNTER — Encounter: Payer: Self-pay | Admitting: Family Medicine

## 2019-08-26 ENCOUNTER — Encounter: Payer: Self-pay | Admitting: Internal Medicine

## 2019-08-27 ENCOUNTER — Other Ambulatory Visit: Payer: Self-pay

## 2019-08-27 MED ORDER — INSULIN LISPRO (1 UNIT DIAL) 100 UNIT/ML (KWIKPEN): 25.0000 [IU] | PEN_INJECTOR | Freq: Three times a day (TID) | SUBCUTANEOUS | 11 refills | Status: DC

## 2019-09-03 ENCOUNTER — Encounter: Payer: Self-pay | Admitting: Internal Medicine

## 2019-09-03 ENCOUNTER — Telehealth: Payer: Self-pay | Admitting: Physician Assistant

## 2019-09-03 MED ORDER — POTASSIUM CHLORIDE CRYS ER 20 MEQ PO TBCR: 20.0000 meq | EXTENDED_RELEASE_TABLET | Freq: Every day | ORAL | 3 refills | Status: DC

## 2019-09-03 NOTE — Telephone Encounter (Signed)
Morgan [name] from [facility] is calling stating they do not have potassium chloride SA (KLOR-CON) 20 MEQ tablet and is requesting the medication be changed to Potassium Chloride Klor-Con M. Please advise. Order # [id number]

## 2019-09-03 NOTE — Telephone Encounter (Signed)
Medication sent to pharmacy  

## 2019-09-04 MED ORDER — LANTUS SOLOSTAR 100 UNIT/ML ~~LOC~~ SOPN: 42.0000 [IU] | PEN_INJECTOR | Freq: Two times a day (BID) | SUBCUTANEOUS | 11 refills | Status: DC

## 2019-09-05 ENCOUNTER — Telehealth (INDEPENDENT_AMBULATORY_CARE_PROVIDER_SITE_OTHER): Payer: 59 | Admitting: Family Medicine

## 2019-09-05 ENCOUNTER — Encounter: Payer: Self-pay | Admitting: Family Medicine

## 2019-09-05 ENCOUNTER — Other Ambulatory Visit: Payer: Self-pay

## 2019-09-05 DIAGNOSIS — R519 Headache, unspecified: Secondary | ICD-10-CM | POA: Diagnosis not present

## 2019-09-05 DIAGNOSIS — Z794 Long term (current) use of insulin: Secondary | ICD-10-CM

## 2019-09-05 DIAGNOSIS — R200 Anesthesia of skin: Secondary | ICD-10-CM

## 2019-09-05 DIAGNOSIS — G8929 Other chronic pain: Secondary | ICD-10-CM

## 2019-09-05 DIAGNOSIS — E119 Type 2 diabetes mellitus without complications: Secondary | ICD-10-CM

## 2019-09-05 MED ORDER — TOPIRAMATE 50 MG PO TABS: 50.0000 mg | ORAL_TABLET | Freq: Every day | ORAL | 1 refills | Status: DC

## 2019-09-05 MED ORDER — SUMATRIPTAN SUCCINATE 25 MG PO TABS: 25.0000 mg | ORAL_TABLET | ORAL | 0 refills | Status: DC | PRN

## 2019-09-05 MED ORDER — TOPIRAMATE 25 MG PO TABS: 25.0000 mg | ORAL_TABLET | Freq: Every day | ORAL | 1 refills | Status: DC

## 2019-09-05 NOTE — Progress Notes (Signed)
[facility] Telemedicine Visit  Patient consented to have virtual visit. Method of visit: Video  Encounter participants: Patient: Morgan [name] - located at home Provider: [place] Amandajo Gonder - located at [facility]  Others (if applicable): n/a  Chief Complaint: migraines  HPI:  Migraines: Patient reports she has now only having one per week. She reports that they are very severe and last more than a day, however.  She does state that it is much easier for her to complete her daily activities though given that she is migraine free for almost 6 days.  She thinks that the medication is working.  She does not recall any significant side effects from the Topamax.  Denies any vision changes or new neurological changes.  BL hand numbness and tingling: Both hands have pins and needles when she pushes on them. Able to give herself insulin.  Patient has an appointment with neurology on 09/07/2019.  She states that she believes that her numbness overall has not worsened and may be slightly improving.  She states however that she is still concerned.  Her vascular surgeon told her that it is not likely due to surgery and she did have a negative cervical MRI on 08/15/2019.  Patient denies any increased weakness in her hands.  Patient reports that she is able to go back to work on 2/15 per surgery.    Diabetes Patient is to schedule appointment with endocrinologist.  She states that they have been communicating over the Internet and she has increased her Lantus to 42 U twice daily.   ROS: per HPI  Pertinent PMHx: T2DM,   Exam:  General: NAD, well-appearing Respiratory: Normal work of breathing, able to speak in complete sentences without issue  Assessment/Plan:  Bilateral hand numbness Patient with normal MRI of cervical spine on 08/15/2019.  S/p left common carotid artery to subclavian artery bypass on 12/28 by [facility].  Patient has been cleared from previous. Patient reports that the  symptoms are not worsening and are slightly improving.  She states that she is able to give herself insulin.  We will continue to monitor and patient to follow-up with neurology.  Differential includes cervical etiology although this is less likely given normal MRI vs diabetic neuropathy given poorly controlled diabetes with last A1c of 14.2 vs carpal tunnel however distribution not consistent. -keep appointment scheduled with neurology on 09/07/2019  Chronic nonintractable headache Patient with normal neuro evaluation on 08/15/2019 aside from new bilateral hand numbness.  Patient reports that her headaches are improved with Topamax and she is now only having 1 headache per week.  We will plan to increase Topamax to 75 mg nightly.  Patient also provided with Imitrex for abortive therapy when she feels a migraine coming on. -Follow-up with neurology on 09/07/2019 concerning migraines as well as bilateral hand numbness   Diabetes Patient to follow-up with endocrinologist.  She is currently on Lantus 42 units twice daily.  Time spent during visit with patient: 16 minutes  [place] Curry Dulski, DO PGY-3, [place]

## 2019-09-05 NOTE — Assessment & Plan Note (Signed)
Patient with normal neuro evaluation on 08/15/2019 aside from new bilateral hand numbness.  Patient reports that her headaches are improved with Topamax and she is now only having 1 headache per week.  We will plan to increase Topamax to 75 mg nightly.  Patient also provided with Imitrex for abortive therapy when she feels a migraine coming on. -Follow-up with neurology on 09/07/2019 concerning migraines as well as bilateral hand numbness

## 2019-09-05 NOTE — Assessment & Plan Note (Signed)
Patient with normal MRI of cervical spine on 08/15/2019.  S/p left common carotid artery to subclavian artery bypass on 12/28 by [facility].  Patient has been cleared from previous. Patient reports that the symptoms are not worsening and are slightly improving.  She states that she is able to give herself insulin.  We will continue to monitor and patient to follow-up with neurology.  Differential includes cervical etiology although this is less likely given normal MRI vs diabetic neuropathy given poorly controlled diabetes with last A1c of 14.2 vs carpal tunnel however distribution not consistent. -keep appointment scheduled with neurology on 09/07/2019

## 2019-09-07 ENCOUNTER — Ambulatory Visit: Payer: 59 | Admitting: Neurology

## 2019-09-07 ENCOUNTER — Other Ambulatory Visit: Payer: Self-pay

## 2019-09-07 ENCOUNTER — Encounter: Payer: Self-pay | Admitting: Neurology

## 2019-09-07 ENCOUNTER — Other Ambulatory Visit: Payer: Self-pay | Admitting: *Deleted

## 2019-09-07 VITALS — BP 132/71 | HR 86 | Temp 97.0°F | Ht 63.0 in | Wt 222.5 lb

## 2019-09-07 DIAGNOSIS — R200 Anesthesia of skin: Secondary | ICD-10-CM

## 2019-09-07 DIAGNOSIS — R29898 Other symptoms and signs involving the musculoskeletal system: Secondary | ICD-10-CM

## 2019-09-07 DIAGNOSIS — I771 Stricture of artery: Secondary | ICD-10-CM

## 2019-09-07 DIAGNOSIS — E1159 Type 2 diabetes mellitus with other circulatory complications: Secondary | ICD-10-CM | POA: Diagnosis not present

## 2019-09-07 DIAGNOSIS — G5603 Carpal tunnel syndrome, bilateral upper limbs: Secondary | ICD-10-CM

## 2019-09-07 DIAGNOSIS — Z794 Long term (current) use of insulin: Secondary | ICD-10-CM

## 2019-09-07 HISTORY — DX: Carpal tunnel syndrome, bilateral upper limbs: G56.03

## 2019-09-07 NOTE — Progress Notes (Signed)
[facility]  PATIENT: Morgan [name] DOB: 1965/12/19  REFERRING DOCTOR OR PCP:  Martinique Shirley, DO SOURCE: Patient, notes from primary care, surgery notes from 07/30/2019, lab reports multiple imaging reports.  MR I and MRA images from 08/15/2019 personally reviewed  _________________________________   HISTORICAL  CHIEF COMPLAINT:  Chief Complaint  Patient presents with  . New Patient (Initial Visit)    RM 13, alone.  Internal referral for hand numbness.     HISTORY OF PRESENT ILLNESS:  I had the pleasure of seeing your patient, Morgan [name], at [facility] for neurologic consultation regarding her hand numbness and arm weakness.  She is a 54 year old woman who had a left carotid subclavian bypass 07/30/2019.    Her A-line was placed on the right.  When she woke up she had numbness in both hands and arms up to her shoulder.    She also had reduced coordination in the hands.   Over the next couple days, symptoms improved and now only involves both hands involving digits 1-4 and the adjacent palm.  The dorsum of the hand has less numbness.   Sometimes she notes pain in her palms.   Coordination is ok when sugars are fine but she notes handwriting becomes sloppy when they are elevated.   Her glucose is often above 300 but has been > 600.   She sees an endocrinologist.    She notes mild left shoulder weakness at times.   She denies any numbness or weakness with her legs.      Prior to surgery, she denies any numbness or weakness or significant pain in her hands.    She does do a lot of computer work.     She has had other procedures including carotid stent and cardiac stents, one placed through an artery in the left arm.    She has had a non-STEMI (LAD diagonal branch, stent by Dr. Burt Knack).    I personally reviewed the MRI of the cervical spine performed 08/15/2019.  By my interpretation, on the axial T2-weighted images (series 8 image 18) there  appears to be a T2 hyperintense focus within the spinal cord this is not confirmed with other sequences.  There is another T2 hyperintense focus adjacent to C4-C5 (series 8 and 9 image 21) where there is moderate spinal stenosis.  There is also moderate spinal stenosis adjacent to C5-C6 though the spinal cord appears normal without level.  At C5-C6, there is also moderately severe left foraminal narrowing that could lead to left C6 nerve root compression.  There is moderate foraminal narrowing at C6-C7 on the left but no narrowing on the right  REPORTS REVIEWED Angiogram 06/14/2019: IMPRESSION:Morgan [name] has an occluded left subclavian artery stent placed 10 years ago at [facility]. 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 I think the best option would be left common carotid to subclavian bypass  MRI cervical spine 08/15/2019 (images also personally reviewed) IMPRESSION: 1. Infiltrated soft tissues around the left carotid subclavian bypass which is widely patent and without pseudoaneurysm on contemporaneous neck MRA. Soft tissue infiltration is closely associated with the brachial plexus and could explain paresthesias. This could reflect perioperative hemorrhage or surgical site infection. 2. No impingement in the cervical spine  MR angiogram neck 08/15/2019 (images also personally reviewed) IMPRESSION: Chronic left proximal subclavian occluded stent with recent left carotid to subclavian bypass. There is regional soft tissue infiltration presumably related  to surgery, see cervical MRI report. No pseudoaneurysm or graft stenosis. Antegrade flow is seen in the left vertebral artery.  REVIEW OF SYSTEMS: Constitutional: No fevers, chills, sweats, or change in appetite Eyes: No visual changes, double vision, eye pain Ear, nose and throat: No hearing loss, ear pain, nasal congestion, sore throat Cardiovascular: No  chest pain, palpitations Respiratory: No shortness of breath at rest or with exertion.   No wheezes GastrointestinaI: No nausea, vomiting, diarrhea, abdominal pain, fecal incontinence Genitourinary: No dysuria, urinary retention or frequency.  No nocturia. Musculoskeletal: No neck pain, back pain Integumentary: No rash, pruritus, skin lesions Neurological: as above Psychiatric: No depression at this time.  No anxiety Endocrine: No palpitations, diaphoresis, change in appetite, change in weigh or increased thirst Hematologic/Lymphatic: No anemia, purpura, petechiae. Allergic/Immunologic: No itchy/runny eyes, nasal congestion, recent allergic reactions, rashes  ALLERGIES: Allergies  Allergen Reactions  . Ilosone [Erythromycin] Anaphylaxis  . Keflex [Cephalexin] Anaphylaxis  . Penicillins Anaphylaxis    Has patient had a PCN reaction causing immediate rash, facial/tongue/throat swelling, SOB or lightheadedness with hypotension: YES Has patient had a PCN reaction causing severe rash involving mucus membranes or skin necrosis: NO Has patient had a PCN reaction that required hospitalizationNO Has patient had a PCN reaction occurring within the last 10 years: NO If all of the above answers are "NO", then may proceed with Cephalosporin use.  . Iron Other (See Comments)    Pt reports condition limiting Iron intake.  . Metformin And Related Other (See Comments)    UNSPECIFIED REACTION  Pt prefers to not take this medication Side effects  . Morphine And Related Other (See Comments)    Pt reports hallucinations    HOME MEDICATIONS:  Current Outpatient Medications:  .  Accu-Chek FastClix Lancets MISC, 1 Package by Does not apply route 3 (three) times daily., Disp: 150 each, Rfl: 3 .  albuterol (VENTOLIN HFA) 108 (90 Base) MCG/ACT inhaler, Inhale 2 puffs into the lungs 2 (two) times daily as needed for wheezing or shortness of breath., Disp: , Rfl:  .  aspirin 81 MG tablet, Take 1 tablet  (81 mg total) by mouth daily., Disp: 30 tablet, Rfl: 1 .  atorvastatin (LIPITOR) 80 MG tablet, Take 1 tablet (80 mg total) by mouth at bedtime., Disp: 30 tablet, Rfl: 11 .  Blood Glucose Monitoring Suppl (CONTOUR NEXT ONE) KIT, 1 kit by Does not apply route as directed., Disp: 1 kit, Rfl: 0 .  Cholecalciferol (VITAMIN D3) 25 MCG (1000 UT) CAPS, Take 1,000-2,000 Units by mouth See admin instructions. Take 2 tablets by mouth once a day on Mon/Wed/Fri and take 1 tablet on Sun/Tues/Thurs/Sat, Disp: , Rfl:  .  EPINEPHrine 0.3 mg/0.3 mL IJ SOAJ injection, Inject 0.3 mg into the muscle once as needed for anaphylaxis (for an anaphylactic reaction). , Disp: , Rfl: 1 .  FLUoxetine (PROZAC) 20 MG tablet, Take 60 mg by mouth daily. , Disp: , Rfl:  .  fluticasone-salmeterol (ADVAIR HFA) 115-21 MCG/ACT inhaler, Inhale 2 puffs into the lungs 2 (two) times daily., Disp: , Rfl:  .  furosemide (LASIX) 20 MG tablet, Take 1 tablet (20 mg total) by mouth 2 (two) times daily., Disp: 180 tablet, Rfl: 3 .  GLUCAGON EMERGENCY 1 MG injection, Inject 1 mg into the vein once as needed (FOR ONSET OF HYPOGLYCEMIA). , Disp: , Rfl: 2 .  glucose blood (CONTOUR NEXT TEST) test strip, Use as instructed, Disp: 400 each, Rfl: 12 .  HYDROcodone-acetaminophen (NORCO) 5-325 MG tablet,  Take 1 tablet by mouth every 6 (six) hours as needed for severe pain., Disp: 20 tablet, Rfl: 0 .  Insulin Glargine (LANTUS SOLOSTAR) 100 UNIT/ML Solostar Pen, Inject 42 Units into the skin 2 (two) times daily., Disp: 30 mL, Rfl: 11 .  insulin lispro (HUMALOG KWIKPEN) 100 UNIT/ML KwikPen, Inject 0.25 mLs (25 Units total) into the skin 3 (three) times daily. (Patient taking differently: Inject 25 Units into the skin 3 (three) times daily. 200U/ml), Disp: 15 mL, Rfl: 11 .  Insulin Pen Needle (PEN NEEDLES) 32G X 4 MM MISC, 1 Device by Does not apply route 5 (five) times daily., Disp: 150 each, Rfl: 11 .  lamoTRIgine (LAMICTAL) 200 MG tablet, Take 200 mg by mouth  daily. , Disp: , Rfl:  .  levothyroxine (SYNTHROID, LEVOTHROID) 150 MCG tablet, Take 150 mcg by mouth at bedtime. 3:30 Am, Disp: , Rfl:  .  losartan (COZAAR) 100 MG tablet, Take 1 tablet (100 mg total) by mouth daily., Disp: 90 tablet, Rfl: 3 .  Lurasidone HCl (LATUDA) 60 MG TABS, Take 60 mg by mouth at bedtime. , Disp: , Rfl:  .  metoprolol tartrate (LOPRESSOR) 50 MG tablet, Take 1 tablet (50 mg total) by mouth 2 (two) times daily., Disp: 180 tablet, Rfl: 3 .  Multiple Vitamin (MULTIVITAMIN WITH MINERALS) TABS tablet, Take 1 tablet by mouth daily. Woman 78 +, Disp: , Rfl:  .  nitroGLYCERIN (NITROSTAT) 0.4 MG SL tablet, Place 1 tablet (0.4 mg total) under the tongue every 5 (five) minutes x 3 doses as needed for chest pain., Disp: 30 tablet, Rfl: 12 .  nystatin (MYCOSTATIN/NYSTOP) powder, Apply topically as needed. (Patient taking differently: Apply 1 g topically daily as needed (infection). ), Disp: 15 g, Rfl: 0 .  nystatin ointment (MYCOSTATIN), Apply 1 application topically daily as needed (infection)., Disp: , Rfl:  .  pantoprazole (PROTONIX) 40 MG tablet, Take 1 tablet (40 mg total) by mouth 2 (two) times daily before a meal. X 8 weeks (Patient taking differently: Take 20 mg by mouth daily. ), Disp: 60 tablet, Rfl: 3 .  potassium chloride (KLOR-CON) 10 MEQ tablet, Take 10 mEq by mouth 2 (two) times daily., Disp: , Rfl:  .  potassium chloride SA (KLOR-CON) 20 MEQ tablet, Take 1 tablet (20 mEq total) by mouth daily., Disp: 90 tablet, Rfl: 3 .  SUMAtriptan (IMITREX) 25 MG tablet, Take 1 tablet (25 mg total) by mouth every 2 (two) hours as needed for migraine. May repeat in 2 hours if headache persists or recurs. Please take as soon as you feel your migraine starting., Disp: 10 tablet, Rfl: 0 .  ticagrelor (BRILINTA) 90 MG TABS tablet, Take 1 tablet (90 mg total) by mouth 2 (two) times daily., Disp: 180 tablet, Rfl: 3 .  topiramate (TOPAMAX) 25 MG tablet, Take 1 tablet (25 mg total) by mouth at  bedtime., Disp: 30 tablet, Rfl: 1 .  topiramate (TOPAMAX) 50 MG tablet, Take 1 tablet (50 mg total) by mouth at bedtime., Disp: 30 tablet, Rfl: 1 .  topiramate (TOPAMAX) 50 MG tablet, Take 1 tablet (50 mg total) by mouth at bedtime., Disp: 30 tablet, Rfl: 1 .  traMADol (ULTRAM) 50 MG tablet, Take 50 mg by mouth every 6 (six) hours as needed for moderate pain (Migraine)., Disp: , Rfl:   PAST MEDICAL HISTORY: Past Medical History:  Diagnosis Date  . Anxiety   . Asthma   . Bipolar depression ([place])   . Chronic bronchitis ([place])   .  Chronic esophagogastric ulcer   . Chronic stomach ulcer   . Fibroid   . GERD (gastroesophageal reflux disease)   . Heart murmur   . Hepatic steatosis   . History of blood transfusion 2010   "related to subclavian stent"  . History of hiatal hernia   . Hyperlipidemia   . Hypertension   . Hypothyroid   . LFTs abnormal   . Migraine    "cerebral migraines; 1-2/month" (06/29/2018)  . Non-ST elevation (NSTEMI) myocardial infarction ([place]) 10/27/2018  . 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 Stroke [facility])    "I've had 2; most recent one was ~ 2015 or before; affected balance" (06/29/2018)  . Thalassemia minor   . Type II diabetes mellitus ([place])   . Vitamin D deficiency     PAST SURGICAL HISTORY: Past Surgical History:  Procedure Laterality Date  . ABDOMINAL HYSTERECTOMY  2015   "still have 1 ovary"  . AORTIC ARCH ANGIOGRAPHY  06/14/2019  . AORTIC ARCH ANGIOGRAPHY N/A 06/14/2019   Procedure: AORTIC ARCH ANGIOGRAPHY;  Surgeon: Lorretta Harp, MD;  Location: [place] [facility];  Service: Cardiovascular;  Laterality: N/A;  . BIOPSY  07/25/2018   Procedure: BIOPSY;  Surgeon: Thornton Park, MD;  Location[facility];  Service: Gastroenterology;;  . CAROTID-SUBCLAVIAN BYPASS GRAFT Left 07/30/2019   Procedure: LEFT CAROTID-SUBCLAVIAN BYPASS GRAFT;  Surgeon: Marty Heck, MD;  Location: [place];   Service: Vascular;  Laterality: Left;  . CESAREAN SECTION  1988; 1996  . CORONARY STENT INTERVENTION N/A 10/27/2018   Procedure: CORONARY STENT INTERVENTION;  Surgeon: Sherren Mocha, MD;  Location: [place] [facility];  Service: Cardiovascular;  Laterality: N/A;  . ESOPHAGOGASTRODUODENOSCOPY (EGD) WITH PROPOFOL N/A 07/25/2018   Procedure: ESOPHAGOGASTRODUODENOSCOPY (EGD) WITH PROPOFOL;  Surgeon: Thornton Park, MD;  Location: [place];  Service: Gastroenterology;  Laterality: N/A;  . KNEE RECONSTRUCTION Left 1980  . LAPAROSCOPIC CHOLECYSTECTOMY    . LEFT HEART CATH AND CORONARY ANGIOGRAPHY N/A 10/27/2018   Procedure: LEFT HEART CATH AND CORONARY ANGIOGRAPHY;  Surgeon: Sherren Mocha, MD;  Location: [place] [facility];  Service: Cardiovascular;  Laterality: N/A;  . SUBCLAVIAN STENT PLACEMENT Left 2010  . TONSILLECTOMY  1970  . UPPER EXTREMITY ANGIOGRAPHY Right 06/14/2019   Procedure: UPPER EXTREMITY ANGIOGRAPHY;  Surgeon: Lorretta Harp, MD;  Location: [place] [facility];  Service: Cardiovascular;  Laterality: Right;  upper ext    FAMILY HISTORY: Family History  Problem Relation Age of Onset  . Diabetes Mother   . Hypertension Mother   . Hyperlipidemia Mother   . CAD Mother   . Cervical cancer Mother   . CAD Father   . Heart failure Father   . Testicular cancer Father   . Hypertension Sister   . Hyperlipidemia Sister   . Hypertension Brother   . Hyperlipidemia Brother     SOCIAL HISTORY:  Social History   Socioeconomic History  . Marital status: Divorced    Spouse name: Not on file  . Number of children: 1  . Years of education: Not on file  . Highest education level: Not on file  Occupational History  . Occupation: Electrical engineer support  Tobacco Use  . Smoking status: Former Smoker    Packs/day: 2.00    Years: 36.00    Pack years: 72.00    Types: Cigarettes    Quit date: 05/14/2014    Years since quitting: 5.3  . Smokeless tobacco: Never  Used    Substance and Sexual Activity  . Alcohol use: Not Currently  . Drug use: Not Currently  . Sexual activity: Not Currently  Other Topics Concern  . Not on file  Social History Narrative   Right handed    Caffeine use: 1 pot coffee per day   Lives with son   Social Determinants of Health   Financial Resource Strain: Low Risk   . Difficulty of Paying Living Expenses: Not very hard  Food Insecurity: Food Insecurity Present  . Worried About Charity fundraiser in the Last Year: Sometimes true  . Ran Out of Food in the Last Year: Sometimes true  Transportation Needs: No Transportation Needs  . Lack of Transportation (Medical): No  . Lack of Transportation (Non-Medical): No  Physical Activity: Insufficiently Active  . Days of Exercise per Week: 1 day  . Minutes of Exercise per Session: 10 min  Stress: No Stress Concern Present  . Feeling of Stress : Only a little  Social Connections: Somewhat Isolated  . Frequency of Communication with Friends and Family: Twice a week  . Frequency of Social Gatherings with Friends and Family: Twice a week  . Attends Religious Services: 1 to 4 times per year  . Active Member of Clubs or Organizations: No  . Attends Archivist Meetings: Never  . Marital Status: Divorced  Human resources officer Violence: Not At Risk  . Fear of Current or Ex-Partner: No  . Emotionally Abused: No  . Physically Abused: No  . Sexually Abused: No     PHYSICAL EXAM  Vitals:   09/07/19 1111  BP: 132/71  Pulse: 86  Temp: (!) 97 F (36.1 C)  Weight: 222 lb 8 oz (100.9 kg)  Height: '5\' 3"'  (1.6 m)    Body mass index is 39.41 kg/m.   General: The patient is well-developed and well-nourished and in no acute distress  HEENT:  Head is [place]/AT.  Sclera are anicteric.  Funduscopic exam shows normal optic discs and retinal vessels.  Neck: No carotid bruits are noted.  The neck is nontender.  Cardiovascular: The heart has a regular rate and rhythm with a normal S1  and S2. There were no murmurs, gallops or rubs.    Skin: Extremities are without rash or  edema.  Musculoskeletal:  Back is nontender  Neurologic Exam  Mental status: The patient is alert and oriented x 3 at the time of the examination. The patient has apparent normal recent and remote memory, with an apparently normal attention span and concentration ability.   Speech is normal.  Cranial nerves: Extraocular movements are full. Pupils are equal, round, and reactive to light and accomodation.  Visual fields are full.  Facial symmetry is present. There is good facial sensation to soft touch bilaterally.Facial strength is normal.  Trapezius and sternocleidomastoid strength is normal. No dysarthria is noted.  The tongue is midline, and the patient has symmetric elevation of the soft palate. No obvious hearing deficits are noted.  Motor:  Muscle bulk is normal.   Tone is normal.  Strength was 4+ in the left and 5 in the right biceps, 4 in the left and 4+ in the right triceps, 4+ in the left and 5 in the right deltoid.  Strength was normal in the hands.  Sensory: She had Tinel's signs at both wrists but no Phalen's sign.  The sensory exam was not consistent but she appeared to have reduced sensation to touch in both hands involving the  first 4 fingers more than the fifth digit.  She had only minimally reduced vibration sensation in the toes.  Coordination: Cerebellar testing reveals good finger-nose-finger and heel-to-shin bilaterally.  Gait and station: Station is normal.   Gait is normal. Tandem gait is normal. Romberg is negative.   Reflexes: Deep tendon reflexes are symmetric and normal bilaterally.   Plantar responses are flexor.      DIAGNOSTIC DATA (LABS, IMAGING, TESTING) - I reviewed patient records, labs, notes, testing and imaging myself where available.  Lab Results  Component Value Date   WBC 10.2 08/14/2019   HGB 13.3 08/14/2019   HCT 45.0 08/14/2019   MCV 67.4 (L)  08/14/2019   PLT 433 (H) 08/14/2019      Component Value Date/Time   NA 130 (L) 08/14/2019 1600   NA 136 06/05/2019 1053   K 4.1 08/14/2019 1600   CL 92 (L) 08/14/2019 1600   CO2 23 08/14/2019 1600   GLUCOSE 781 (HH) 08/14/2019 1600   BUN 10 08/14/2019 1600   BUN 13 06/05/2019 1053   CREATININE 0.98 08/14/2019 1600   CALCIUM 9.2 08/14/2019 1600   PROT 6.1 (L) 07/26/2019 1117   PROT 6.2 02/27/2019 1431   ALBUMIN 3.4 (L) 07/26/2019 1117   ALBUMIN 3.9 02/27/2019 1431   AST 29 07/26/2019 1117   ALT 27 07/26/2019 1117   ALKPHOS 80 07/26/2019 1117   BILITOT 0.9 07/26/2019 1117   BILITOT 0.6 02/27/2019 1431   GFRNONAA >60 08/14/2019 1600   GFRAA >60 08/14/2019 1600   Lab Results  Component Value Date   CHOL 130 06/15/2019   HDL 31 (L) 06/15/2019   LDLCALC 50 06/15/2019   LDLDIRECT 144.0 09/25/2018   TRIG 244 (H) 06/15/2019   CHOLHDL 4.2 06/15/2019   Lab Results  Component Value Date   HGBA1C 14.2 (H) 07/30/2019   Lab Results  Component Value Date   BOFBPZWC58 527 07/25/2018   Lab Results  Component Value Date   TSH 2.978 07/26/2018       ASSESSMENT AND PLAN  Arm weakness - Plan: NCV with EMG(electromyography)  Type 2 diabetes mellitus with other circulatory complication, with long-term current use of insulin (HCC)  Stenosis of left subclavian artery (HCC)  Carpal tunnel syndrome, bilateral - Plan: NCV with EMG(electromyography)  Bilateral hand numbness - Plan: NCV with EMG(electromyography)   In summary, Ernestina Joe is a 54 year old woman reporting numbness in both arms with increased weakness since a carotid subclavian bypass procedure.  On exam, she does have mild weakness in the left biceps, both triceps (left worse than right) and left deltoid.  She also has Tinel signs at the wrists and numbness in her hands.   The MRI of the cervical spine does show moderate spinal stenosis at C4-C5 and C5-C6 and, by my interpretation, there is possible myelopathic  signal adjacent to C4-C5 to the right.  There is also abnormal signal in the spinal cord on the one axial T2 sequence but not on other sequences that could be artifactual or represent myelopathic or demyelinating signal.  Although she might have carpal tunnel syndrome, the weakness approximately could not be explained by that finding.  Although she might have carpal tunnel syndrome, that finding cannot explain her more proximal weakness and numbness distribution (including the dorsum of the hands).  We will check an NCV/EMG to help sort out the relative contribution of the different processes.  If she does not show further improvement, consider referral to neurosurgery for the spinal stenosis  and possible myelopathic signal.   Complicating her history, she also has very poorly controlled diabetes with glucose readings above 300 on a regular basis and hemoglobin A1c of 14.  Despite this, she only had mild reduced sensation in her feet so she does not appear to have a significant diabetic polyneuropathy.  I will see her when she returns for the NCV/EMG and further follow-up or referral will be made at that time.    Thank you for asking to see Morgan [name].  Please let me know if I can be of further assistance with her or other patients in the future.    Mathan Darroch A. Felecia Shelling, MD, [facility] 08/08/7937, 0:30 PM Certified in Neurology, Clinical Neurophysiology, Sleep Medicine and Neuroimaging  [facility] [address], [place],  [address] [phone]

## 2019-09-07 NOTE — Telephone Encounter (Signed)
Request for 50mg  topamax be sent to mail order pharmacy.  Will forward to [place]

## 2019-09-08 ENCOUNTER — Encounter: Payer: Self-pay | Admitting: Internal Medicine

## 2019-09-10 ENCOUNTER — Encounter: Payer: Self-pay | Admitting: Family Medicine

## 2019-09-11 ENCOUNTER — Encounter: Payer: Self-pay | Admitting: Internal Medicine

## 2019-09-13 ENCOUNTER — Encounter: Payer: Self-pay | Admitting: Family Medicine

## 2019-09-14 MED ORDER — ALBUTEROL SULFATE HFA 108 (90 BASE) MCG/ACT IN AERS: 2.0000 | INHALATION_SPRAY | Freq: Two times a day (BID) | RESPIRATORY_TRACT | 1 refills | Status: DC | PRN

## 2019-09-14 MED ORDER — FLUTICASONE-SALMETEROL 115-21 MCG/ACT IN AERO: 2.0000 | INHALATION_SPRAY | Freq: Two times a day (BID) | RESPIRATORY_TRACT | 1 refills | Status: DC

## 2019-09-21 ENCOUNTER — Other Ambulatory Visit: Payer: Self-pay | Admitting: Family Medicine

## 2019-09-22 ENCOUNTER — Encounter: Payer: Self-pay | Admitting: Family Medicine

## 2019-09-24 ENCOUNTER — Encounter: Payer: Self-pay | Admitting: Neurology

## 2019-09-24 ENCOUNTER — Other Ambulatory Visit: Payer: Self-pay

## 2019-09-24 MED ORDER — FLUTICASONE-SALMETEROL 115-21 MCG/ACT IN AERO: 2.0000 | INHALATION_SPRAY | Freq: Two times a day (BID) | RESPIRATORY_TRACT | 1 refills | Status: DC

## 2019-09-25 ENCOUNTER — Telehealth: Payer: Self-pay

## 2019-09-25 ENCOUNTER — Telehealth: Payer: Self-pay | Admitting: Cardiovascular Disease

## 2019-09-25 ENCOUNTER — Telehealth: Payer: Self-pay | Admitting: *Deleted

## 2019-09-25 NOTE — Telephone Encounter (Signed)
Forms completed and placed to be faxed. Any further disability forms should be directed to patient's neurologist as they will be treating and further evaluating patient's hand numbness and weakness.

## 2019-09-25 NOTE — Telephone Encounter (Signed)
Pt wanted to make her in office visit 03-01 virtual.

## 2019-09-25 NOTE — Telephone Encounter (Signed)
Gave letter to medical records to contact pt to get release form to fax letter.

## 2019-09-25 NOTE — Telephone Encounter (Signed)
Patient would like a virtual visit. Patient was told at her last virtual visit with Billey Chang PA that her next visit would have to be in person. Patient stated she does not drive and would have to get a stranger to bring her and she would just rather do a virtual. Will see if Dr. Johnsie Cancel thinks she needs to be seen or is virtual visit okay.

## 2019-09-25 NOTE — Telephone Encounter (Signed)
Lisa from patient's short term disability office, calls nurse line regarding follow up on questions faxed to office regarding short term disability.   Questions are in Dr. Bonnita Levan box. Fax date 09/18/19.   Informed representative that we would work on completing as soon as possible. Informed of turn around time on paperwork. Verbalized understanding.   To PCP  Talbot Grumbling, RN

## 2019-09-25 NOTE — Telephone Encounter (Signed)
I was unable to reach pt, I was not able to leave a voicemail. Pt need to sign a release form.

## 2019-09-26 ENCOUNTER — Telehealth: Payer: Self-pay | Admitting: *Deleted

## 2019-09-26 NOTE — Progress Notes (Signed)
Virtual Visit via Video Note   This visit type was conducted due to national recommendations for restrictions regarding the COVID-19 Pandemic (e.g. social distancing) in an effort to limit this patient's exposure and mitigate transmission in our community.  Due to her co-morbid illnesses, this patient is at least at moderate risk for complications without adequate follow up.  This format is felt to be most appropriate for this patient at this time.  All issues noted in this document were discussed and addressed.  A limited physical exam was performed with this format.  Please refer to the patient's chart for her consent to telehealth for [facility]   Date:  10/01/2019   ID:  Morgan [name], DOB [birth year], MRN [id number]  Patient Location: Home Provider Location: Home  PCP:  Shirley, [place], DO  Cardiologist:  Jenkins Rouge, MD  Electrophysiologist:  None   Evaluation Performed:  Follow-Up Visit  Chief Complaint:  followup  History of Present Illness:    Morgan [name] is a 54 y.o. female with past medical history of CAD, bipolar depression, chronic bronchitis, hypertension, hyperlipidemia, hypothyroidism, left subclavian stenosis, history of CVA and DM2.  Patient was admitted in March 2020 with intractable nausea, vomiting and DKA.  Troponin was elevated, she underwent cardiac catheterization by Dr. Burt Knack which showed severe LAD and D1 stenosis treated with DES.  Echocardiogram obtained on 10/26/2018 showed EF 45 to 50% with regional wall motion abnormality in the septal and apex consistent with her CAD.  She was discharged on Lasix, losartan, Lopressor, aspirin and Brilinta.    Carotid Doppler obtained on 04/17/2019 showed mild bilateral ICA disease, monophasic waveform in the left subclavian artery suggesting proximal stenosis with subclavian steal syndrome.  Subsequent CT angiogram obtained on 9/25 confirmed occluded left subclavian artery stent that was placed a decade ago at  [facility].  She was evaluated by Dr. Alvester Chou and eventually underwent peripheral vascular catheterization 06/14/2019 with aortic arch angiography.  The procedure demonstrated type I arch with occluded left subclavian artery stent at its origin and throughout its entirety with retrograde left vertebral filling of the left subclavian artery.  Dr. Gwenlyn Found recommended left common carotid to subclavian bypass.  The patient was referred to Dr. Fortunato Curling of vascular surgery. He did left common carotid to subclavian bypass 07/30/19   Doing well post op Discussed f/u with Dr Gwenlyn Found for her PVD and f/u duplex in 6 months when she sees him Post carotid/subclavian bypass She has had good symptomatic relief of her left arm weakness .    Past Medical History:  Diagnosis Date  . Anxiety   . Asthma   . Bipolar depression ([place])   . Chronic bronchitis ([place])   . Chronic esophagogastric ulcer   . Chronic stomach ulcer   . Fibroid   . GERD (gastroesophageal reflux disease)   . Heart murmur   . Hepatic steatosis   . History of blood transfusion 2010   "related to subclavian stent"  . History of hiatal hernia   . Hyperlipidemia   . Hypertension   . Hypothyroid   . LFTs abnormal   . Migraine    "cerebral migraines; 1-2/month" (06/29/2018)  . Non-ST elevation (NSTEMI) myocardial infarction ([place]) 10/27/2018  . 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 Stroke [facility])    "I've had 2; most recent one was ~ 2015 or before; affected balance" (06/29/2018)  . Thalassemia minor   . Type  II diabetes mellitus ([place])   . Vitamin D deficiency    Past Surgical History:  Procedure Laterality Date  . ABDOMINAL HYSTERECTOMY  2015   "still have 1 ovary"  . AORTIC ARCH ANGIOGRAPHY  06/14/2019  . AORTIC ARCH ANGIOGRAPHY N/A 06/14/2019   Procedure: AORTIC ARCH ANGIOGRAPHY;  Surgeon: Lorretta Harp, MD;  Location: [place] CV [facility];  Service: Cardiovascular;  Laterality:  N/A;  . BIOPSY  07/25/2018   Procedure: BIOPSY;  Surgeon: Thornton Park, MD;  Location: [facility] ENDOSCOPY;  Service: Gastroenterology;;  . CAROTID-SUBCLAVIAN BYPASS GRAFT Left 07/30/2019   Procedure: LEFT CAROTID-SUBCLAVIAN BYPASS GRAFT;  Surgeon: Marty Heck, MD;  Location: [place];  Service: Vascular;  Laterality: Left;  . CESAREAN SECTION  1988; 1996  . CORONARY STENT INTERVENTION N/A 10/27/2018   Procedure: CORONARY STENT INTERVENTION;  Surgeon: Sherren Mocha, MD;  Location: [place] [facility];  Service: Cardiovascular;  Laterality: N/A;  . ESOPHAGOGASTRODUODENOSCOPY (EGD) WITH PROPOFOL N/A 07/25/2018   Procedure: ESOPHAGOGASTRODUODENOSCOPY (EGD) WITH PROPOFOL;  Surgeon: Thornton Park, MD;  Location: [place];  Service: Gastroenterology;  Laterality: N/A;  . KNEE RECONSTRUCTION Left 1980  . LAPAROSCOPIC CHOLECYSTECTOMY    . LEFT HEART CATH AND CORONARY ANGIOGRAPHY N/A 10/27/2018   Procedure: LEFT HEART CATH AND CORONARY ANGIOGRAPHY;  Surgeon: Sherren Mocha, MD;  Location: [place] [facility];  Service: Cardiovascular;  Laterality: N/A;  . SUBCLAVIAN STENT PLACEMENT Left 2010  . TONSILLECTOMY  1970  . UPPER EXTREMITY ANGIOGRAPHY Right 06/14/2019   Procedure: UPPER EXTREMITY ANGIOGRAPHY;  Surgeon: Lorretta Harp, MD;  Location: [place] [facility];  Service: Cardiovascular;  Laterality: Right;  upper ext     Current Meds  Medication Sig  . Accu-Chek FastClix Lancets MISC 1 Package by Does not apply route 3 (three) times daily.  [name] aspirin 81 MG tablet Take 1 tablet (81 mg total) by mouth daily.  [name] atorvastatin (LIPITOR) 80 MG tablet Take 1 tablet (80 mg total) by mouth at bedtime.  . Blood Glucose Monitoring Suppl (CONTOUR NEXT ONE) KIT 1 kit by Does not apply route as directed.  . Cholecalciferol (VITAMIN D3) 25 MCG (1000 UT) CAPS Take 1,000-2,000 Units by mouth See admin instructions. Take 2 tablets by mouth once a day on Mon/Wed/Fri and take 1 tablet on  Sun/Tues/Thurs/Sat  . EPINEPHrine 0.3 mg/0.3 mL IJ SOAJ injection Inject 0.3 mg into the muscle once as needed for anaphylaxis (for an anaphylactic reaction).   [name] FLUoxetine (PROZAC) 20 MG tablet Take 60 mg by mouth daily.   . fluticasone-salmeterol (ADVAIR HFA) 115-21 MCG/ACT inhaler Inhale 2 puffs into the lungs 2 (two) times daily.  . furosemide (LASIX) 20 MG tablet Take 1 tablet (20 mg total) by mouth 2 (two) times daily.  [name] GLUCAGON EMERGENCY 1 MG injection Inject 1 mg into the vein once as needed (FOR ONSET OF HYPOGLYCEMIA).   [name] glucose blood (CONTOUR NEXT TEST) test strip Use as instructed  . Insulin Glargine (LANTUS SOLOSTAR) 100 UNIT/ML Solostar Pen Inject 42 Units into the skin 2 (two) times daily.  . insulin lispro (HUMALOG KWIKPEN) 100 UNIT/ML KwikPen Inject 0.25 mLs (25 Units total) into the skin 3 (three) times daily. (Patient taking differently: Inject 25 Units into the skin 3 (three) times daily. 200U/ml)  . Insulin Pen Needle (PEN NEEDLES) 32G X 4 MM MISC 1 Device by Does not apply route 5 (five) times daily.  [name] lamoTRIgine (LAMICTAL) 200 MG tablet Take 200 mg by mouth daily.   [name] levothyroxine (  SYNTHROID, LEVOTHROID) 150 MCG tablet Take 150 mcg by mouth at bedtime. 3:30 Am  . losartan (COZAAR) 100 MG tablet Take 1 tablet (100 mg total) by mouth daily.  . Lurasidone HCl (LATUDA) 60 MG TABS Take 60 mg by mouth at bedtime.   . metoprolol tartrate (LOPRESSOR) 50 MG tablet Take 1 tablet (50 mg total) by mouth 2 (two) times daily.  . Multiple Vitamin (MULTIVITAMIN WITH MINERALS) TABS tablet Take 1 tablet by mouth daily. Woman 50 +  . nitroGLYCERIN (NITROSTAT) 0.4 MG SL tablet Place 1 tablet (0.4 mg total) under the tongue every 5 (five) minutes x 3 doses as needed for chest pain.  [name] nystatin (MYCOSTATIN/NYSTOP) powder Apply topically as needed. (Patient taking differently: Apply 1 g topically daily as needed (infection). )  . nystatin ointment (MYCOSTATIN) Apply 1 application topically  daily as needed (infection).  . pantoprazole (PROTONIX) 40 MG tablet Take 1 tablet (40 mg total) by mouth 2 (two) times daily before a meal. X 8 weeks (Patient taking differently: Take 20 mg by mouth daily. )  . potassium chloride (KLOR-CON) 10 MEQ tablet Take 10 mEq by mouth 2 (two) times daily.  . SUMAtriptan (IMITREX) 25 MG tablet Take 1 tablet (25 mg total) by mouth every 2 (two) hours as needed for migraine. May repeat in 2 hours if headache persists or recurs. Please take as soon as you feel your migraine starting.  . ticagrelor (BRILINTA) 90 MG TABS tablet Take 1 tablet (90 mg total) by mouth 2 (two) times daily.  [name] topiramate (TOPAMAX) 25 MG tablet Take 1 tablet (25 mg total) by mouth at bedtime.  . topiramate (TOPAMAX) 50 MG tablet Take 1 tablet (50 mg total) by mouth at bedtime.  . traMADol (ULTRAM) 50 MG tablet Take 50 mg by mouth every 6 (six) hours as needed for moderate pain (Migraine).  . VENTOLIN HFA 108 (90 Base) MCG/ACT inhaler INHALE 2 INHALATIONS BY  MOUTH INTO THE LUNGS TWICE  DAILY AS NEEDED FOR  WHEEZING OR SHORTNESS OF  BREATH     Allergies:   Ilosone [erythromycin], Keflex [cephalexin], Penicillins, Iron, Metformin and related, and Morphine and related   Social History   Tobacco Use  . Smoking status: Former Smoker    Packs/day: 2.00    Years: 36.00    Pack years: 72.00    Types: Cigarettes    Quit date: 05/14/2014    Years since quitting: 5.3  . Smokeless tobacco: Never Used  Substance Use Topics  . Alcohol use: Not Currently  . Drug use: Not Currently     Family Hx: The patient's family history includes CAD in her father and mother; Cervical cancer in her mother; Diabetes in her mother; Heart failure in her father; Hyperlipidemia in her brother, mother, and sister; Hypertension in her brother, mother, and sister; Testicular cancer in her father.  ROS:   Please see the history of present illness.     All other systems reviewed and are negative.   Prior  CV studies:   The following studies were reviewed today:  Peripheral vascular angiography 06/14/2019 Angiographic Data:   1: Aortic arch angiography-this was a type I arch with an occluded left subclavian artery stent at its origin and throughout its entirety with retrograde left vertebral filling of the left subclavian artery.  This was confirmed with selective right innominate angiography.  There was a small distance between the distal edge of the stent and the takeoff of the large vertebral and IMA.  IMPRESSION: Ms. Horan has an occluded left subclavian artery stent placed 10 years ago at [facility].  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  I think the best option would be left common carotid to subclavian bypass.  I reviewed her angiograms with Dr. Fortunato Curling who agrees and will see her prior to her being discharged from the hospital.  She is on dual antiplatelet therapy for non-STEMI and LAD diagonal branch intervention by Dr. Burt Knack in March of this year.  Optimally, because of the elective nature of this procedure she should remain on DAPT for at least 12 months uninterrupted.  A right common femoral angiogram was performed and a MYNX closure device was successfully deployed.  The patient left the lab in stable condition.  Labs/Other Tests and Data Reviewed:    EKG:  ST rate 131 nonspecific ST changes 05/14/19   Recent Labs: 05/16/2019: Magnesium 1.6 07/26/2019: ALT 27 08/14/2019: BUN 10; Creatinine, Ser 0.98; Hemoglobin 13.3; Platelets 433; Potassium 4.1; Sodium 130   Recent Lipid Panel Lab Results  Component Value Date/Time   CHOL 130 06/15/2019 02:51 AM   TRIG 244 (H) 06/15/2019 02:51 AM   HDL 31 (L) 06/15/2019 02:51 AM   CHOLHDL 4.2 06/15/2019 02:51 AM   LDLCALC 50 06/15/2019 02:51 AM   LDLDIRECT 144.0 09/25/2018 03:05 PM    Wt Readings from Last 3 Encounters:  10/01/19 221 lb (100.2 kg)   09/07/19 222 lb 8 oz (100.9 kg)  08/21/19 214 lb (97.1 kg)     Objective:    Vital Signs:  BP 120/68   Pulse 82   Ht '5\' 3"'  (1.6 m)   Wt 221 lb (100.2 kg)   LMP 08/03/2013   SpO2 94%   BMI 39.15 kg/m    Affect appropriate Healthy:  appears stated age 30: normal scar left supraclavicular from Carotid to subclavian bypass  Neck supple with no adenopathy JVP normal left  bruits no thyromegaly Lungs clear with no wheezing and good diaphragmatic motion Heart:  S1/S2 no murmur, no rub, gallop or click PMI normal Abdomen: benighn, BS positve, no tenderness, no AAA no bruit.  No HSM or HJR Post left carotid to subclavian bypass with improved pulse in left brachial  No edema Neuro non-focal Skin warm and dry No muscular weakness   ASSESSMENT & PLAN:    1. CAD: Continue aspirin and Brilinta.  Last PCI was in March 2020.  DC Brillinta June  She denies any recent chest pain.  2. Left subclavian stenosis: Occluded stent with carotid to subclavian bypass by Dr Carlis Abbott 07/30/19  3. Hypertension: Well controlled.  Continue current medications and low sodium Dash type diet.    4. Hyperlipidemia: Continue Lipitor  5. Hypothyroidism: Managed by primary care provider  6. DM2: Discussed low carb diet.  Target hemoglobin A1c is 6.5 or less.  Continue current medications.  7. History of CVA: No recent recurrence   COVID-19 Education: The signs and symptoms of COVID-19 were discussed with the patient and how to seek care for testing (follow up with PCP or arrange E-visit).  The importance of social distancing was discussed today.   Medication Adjustments/Labs and Tests Ordered:  DC Brillinta in June    Tests Ordered: No orders of the defined types were placed in this encounter.   Medication Changes:  Will have her diabetic doctor check BMET/Lipid/Liver with her A1c next visit   Follow Up: 6 months   Signed, Collier [place]ina  Johnsie Cancel, MD  10/01/2019 2:23 PM    [place]

## 2019-09-26 NOTE — Telephone Encounter (Signed)
Gave completed/signed Bebe Liter form back to medical records to process for pt.  Disability claim# [id number].

## 2019-09-26 NOTE — Telephone Encounter (Signed)
Ok to make vitrual visit on one of my off days

## 2019-09-26 NOTE — Telephone Encounter (Signed)
Patient decided to come in for her appointment.

## 2019-09-27 ENCOUNTER — Telehealth: Payer: Self-pay | Admitting: *Deleted

## 2019-09-27 NOTE — Telephone Encounter (Signed)
I faxed pt form to [facility] [phone]

## 2019-09-28 ENCOUNTER — Other Ambulatory Visit: Payer: Self-pay | Admitting: Family Medicine

## 2019-10-01 ENCOUNTER — Other Ambulatory Visit: Payer: Self-pay | Admitting: Cardiovascular Disease

## 2019-10-01 ENCOUNTER — Ambulatory Visit (INDEPENDENT_AMBULATORY_CARE_PROVIDER_SITE_OTHER): Payer: 59 | Admitting: Cardiovascular Disease

## 2019-10-01 ENCOUNTER — Encounter: Payer: Self-pay | Admitting: Cardiovascular Disease

## 2019-10-01 ENCOUNTER — Other Ambulatory Visit: Payer: Self-pay

## 2019-10-01 VITALS — BP 120/68 | HR 82 | Ht 63.0 in | Wt 221.0 lb

## 2019-10-01 DIAGNOSIS — I2510 Atherosclerotic heart disease of native coronary artery without angina pectoris: Secondary | ICD-10-CM | POA: Diagnosis not present

## 2019-10-01 DIAGNOSIS — I771 Stricture of artery: Secondary | ICD-10-CM

## 2019-10-01 NOTE — Patient Instructions (Addendum)
Medication Instructions:  Your physician has recommended you make the following change in your medication:   Stop your Brilinta in June  *If you need a refill on your cardiac medications before your next appointment, please call your pharmacy*   Lab Work: If you have labs (blood work) drawn today and your tests are completely normal, you will receive your results only by: [name] MyChart Message (if you have MyChart) OR . A paper copy in the mail If you have any lab test that is abnormal or we need to change your treatment, we will call you to review the results.  Testing/Procedures: Your physician has requested that you have a carotid duplex. This test is an ultrasound of the carotid arteries in your neck. It looks at blood flow through these arteries that supply the brain with blood. Allow one hour for this exam. There are no restrictions or special instructions.  Your physician has requested that you have left upper extremity arterial duplex. This test is an ultrasound of the arteries in the arms. It looks at arterial blood flow in the arms. Allow one hour for Upper Arterial scans. There are no restrictions or special instructions  Follow-Up: At [facility], you and your health needs are our priority.  As part of our continuing mission to provide you with exceptional heart care, we have created designated Provider Care Teams.  These Care Teams include your primary Cardiologist (physician) and Advanced Practice Providers (APPs -  Physician Assistants and Nurse Practitioners) who all work together to provide you with the care you need, when you need it.  We recommend signing up for the patient portal called "MyChart".  Sign up information is provided on this After Visit Summary.  MyChart is used to connect with patients for Virtual Visits (Telemedicine).  Patients are able to view lab/test results, encounter notes, upcoming appointments, etc.  Non-urgent messages can be sent to your provider as  well.   To learn more about what you can do with MyChart, go to [email or web address].    Your next appointment:   With Dr. Gwenlyn Found to follow up for PVD

## 2019-10-09 ENCOUNTER — Ambulatory Visit (INDEPENDENT_AMBULATORY_CARE_PROVIDER_SITE_OTHER): Payer: 59 | Admitting: Neurology

## 2019-10-09 ENCOUNTER — Other Ambulatory Visit: Payer: Self-pay

## 2019-10-09 ENCOUNTER — Encounter (INDEPENDENT_AMBULATORY_CARE_PROVIDER_SITE_OTHER): Payer: 59 | Admitting: Neurology

## 2019-10-09 DIAGNOSIS — Z0289 Encounter for other administrative examinations: Secondary | ICD-10-CM

## 2019-10-09 DIAGNOSIS — R200 Anesthesia of skin: Secondary | ICD-10-CM | POA: Diagnosis not present

## 2019-10-09 DIAGNOSIS — G5603 Carpal tunnel syndrome, bilateral upper limbs: Secondary | ICD-10-CM | POA: Diagnosis not present

## 2019-10-09 DIAGNOSIS — R29898 Other symptoms and signs involving the musculoskeletal system: Secondary | ICD-10-CM

## 2019-10-09 DIAGNOSIS — M4802 Spinal stenosis, cervical region: Secondary | ICD-10-CM | POA: Insufficient documentation

## 2019-10-09 MED ORDER — ALPRAZOLAM 0.5 MG PO TABS: ORAL_TABLET | ORAL | 0 refills | Status: DC

## 2019-10-09 NOTE — Telephone Encounter (Signed)
[place], from patients disability office, calls nurse line for more questions regarding patients diagnosis and plan of care. [place] informed to FU with Neurologist for further questions in regards to patients disability.

## 2019-10-09 NOTE — Progress Notes (Signed)
Full Name: Morgan [name] Gender: Female MRN #: [id number] Date of Birth: Dec 31, 1965    Visit Date: 10/09/2019 09:12 Age: 54 Years Examining Physician: Arlice Colt, MD  Referring Physician: Arlice Colt, MD    History: Morgan [name] is a 54 year old woman who reports numbness and weakness in the arms since vascular surgery a couple months ago.  She notes the numbness in both arms but mostly in the hands.  She notes reduced grip and forearm arm flexion and arm elevation.  Nerve conduction studies: The median motor responses had mildly delayed distal latencies, borderline normal amplitude and normal conduction velocities.  The ulnar motor responses were normal.  Ulnar F-wave responses had normal latencies.  Bilateral median sensory responses were slowed across the wrist, right worse than left and had reduced amplitudes.  Both ulnar sensory responses were slowed across the wrist, the left slower than the adjacent median sensory responses.  The left ulnar sensory response was only obtainable with antidromic stimulation.  The radial sensory responses were normal.  Electromyography: Needle EMG of selected muscles of the right arm was obtained.  Motor unit morphology and recruitment was normal in all of the muscles tested except for a few polyphasic motor units in the triceps.  There is no definite denervation.  Impression: This NCV/EMG study shows the following: 1.    Bilateral moderate median neuropathies across the wrist (carpal tunnel syndrome) 2.    Bilateral mild ulnar neuropathies across the wrist. 3.    There were no significant superimposed radiculopathies.  Morgan [name] A. Felecia Shelling, MD, PhD, FAAN Certified in Neurology, Clinical Neurophysiology, Sleep Medicine, Pain Medicine and Neuroimaging Director, [facility] [address], [place], [place] [address] [phone]         [facility]      Nerve / Sites Muscle Latency Ref. Amplitude Ref. Rel Amp Segments Distance Velocity Ref. Area    ms ms mV mV %  cm m/s m/s mVms  L Median - APB     Wrist APB 4.5 ?4.4 4.0 ?4.0 100 Wrist - APB 7   14.4     Upper arm APB 8.5  3.3  82.1 Upper arm - Wrist 20 51 ?49 17.7  R Median - APB     Wrist APB 4.8 ?4.4 4.0 ?4.0 100 Wrist - APB 7   14.3     Upper arm APB 9.1  2.6  64.1 Upper arm - Wrist 21 49 ?49 10.1  L Ulnar - ADM     Wrist ADM 3.1 ?3.3 9.8 ?6.0 100 Wrist - ADM 7   23.4     B.Elbow ADM 6.5  6.9  70.8 B.Elbow - Wrist 18 53 ?49 20.0     A.Elbow ADM 8.4  6.2  90.1 A.Elbow - B.Elbow 10 53 ?49 18.1         A.Elbow - Wrist      R Ulnar - ADM     Wrist ADM 3.2 ?3.3 6.6 ?6.0 100 Wrist - ADM 7   15.3     B.Elbow ADM 6.5  6.8  103 B.Elbow - Wrist 18 54 ?49 19.7     A.Elbow ADM 8.5  7.0  104 A.Elbow - B.Elbow 10 51 ?49 20.5         A.Elbow - Wrist                 SNC    Nerve /  Sites Rec. Site Peak Lat Ref.  Amp Ref. Segments Distance Peak Diff Ref.    ms ms V V  cm ms ms  L Radial - Anatomical snuff box (Forearm)     Forearm Wrist 2.6 ?2.9 15 ?15 Forearm - Wrist 10    R Radial - Anatomical snuff box (Forearm)     Forearm Wrist 2.4 ?2.9 20 ?15 Forearm - Wrist 10    L Median, Ulnar - Transcarpal comparison     Median Palm Wrist 4.8 ?2.2 16 ?35 Median Palm - Wrist 8       Ulnar Palm Wrist 4.5 ?2.2 17 ?12 Ulnar Palm - Wrist 8          Median Palm - Ulnar Palm  0.3 ?0.4  R Median, Ulnar - Transcarpal comparison     Median Palm Wrist 6.3 ?2.2 11 ?35 Median Palm - Wrist 8       Ulnar Palm Wrist 4.1 ?2.2 14 ?12 Ulnar Palm - Wrist 8          Median Palm - Ulnar Palm  2.3 ?0.4  L Median - Orthodromic (Dig II, Mid palm)     Dig II Wrist 3.8 ?3.4 2 ?10 Dig II - Wrist 13    R Median - Orthodromic (Dig II, Mid palm)     Dig II Wrist 5.4 ?3.4 4 ?10 Dig II - Wrist 13    L Ulnar - Orthodromic, (Dig V, Mid palm)     Dig V Wrist NR ?3.1 NR ?5 Dig V - Wrist 11    R Ulnar - Orthodromic, (Dig V, Mid  palm)     Dig V Wrist 3.0 ?3.1 4 ?5 Dig V - Wrist 91                       F  Wave    Nerve F Lat Ref.   ms ms  L Ulnar - ADM 31.4 ?32.0  R Ulnar - ADM 30.9 ?32.0         EMG Summary Table    Spontaneous MUAP Recruitment  Muscle IA Fib PSW Fasc Other Amp Dur. Poly Pattern  R. Deltoid Normal None None None _______ Normal Normal Normal Normal  R. Abductor pollicis brevis Normal None None None _______ Normal Normal Normal Normal  R. Triceps brachii Normal None None None _______ Normal Normal 1+ Normal  R. Biceps brachii Normal None None None _______ Normal Normal Normal Normal  R. Extensor digitorum communis Normal None None None _______ Normal Normal Normal Normal  R. First dorsal interosseous Normal None None None _______ Normal Normal Normal Normal        [facility]  PATIENT: Morgan [name] DOB: 08/05/1965  REFERRING DOCTOR OR PCP:  [place] Shirley, DO SOURCE: Patient, notes from primary care, surgery notes from 07/30/2019, lab reports multiple imaging reports.  MR I and MRA images from 08/15/2019 personally reviewed  _________________________________   HISTORICAL  CHIEF COMPLAINT:  Chief Complaint  Patient presents with  . Numbness   Update 10/09/2019: Since last visit, she feels that her numbness and strength are unchanged.  She continues to note numbness in both hands, involving most of the fingers the numbness higher up in the arms is just mild.  Additionally she notes weakness in the hands and arms but she is able to lift her arms over her head.   Discussed results of the NCV/EMG study showing median and ulnar neuropathies at the wrist  but no radiculopathy.  I also discussed the MRI imaging concern about possible myelopathy  From initial consult 09/07/2019 I had the pleasure of seeing your patient, Morgan [name], at [facility] for neurologic consultation regarding her hand numbness and arm weakness.  She is a 54 year old woman  who had a left carotid subclavian bypass 07/30/2019.    Her A-line was placed on the right.  When she woke up she had numbness in both hands and arms up to her shoulder.    She also had reduced coordination in the hands.   Over the next couple days, symptoms improved and now only involves both hands involving digits 1-4 and the adjacent palm.  The dorsum of the hand has less numbness.   Sometimes she notes pain in her palms.   Coordination is ok when sugars are fine but she notes handwriting becomes sloppy when they are elevated.   Her glucose is often above 300 but has been > 600.   She sees an endocrinologist.    She notes mild left shoulder weakness at times.   She denies any numbness or weakness with her legs.      Prior to surgery, she denies any numbness or weakness or significant pain in her hands.    She does do a lot of computer work.     She has had other procedures including carotid stent and cardiac stents, one placed through an artery in the left arm.    She has had a non-STEMI (LAD diagonal branch, stent by Dr. Burt Knack).    I personally reviewed the MRI of the cervical spine performed 08/15/2019.  By my interpretation, on the axial T2-weighted images (series 8 image 18) there appears to be a T2 hyperintense focus within the spinal cord this is not confirmed with other sequences.  There is another T2 hyperintense focus adjacent to C4-C5 (series 8 and 9 image 21) where there is moderate spinal stenosis.  There is also moderate spinal stenosis adjacent to C5-C6 though the spinal cord appears normal without level.  At C5-C6, there is also moderately severe left foraminal narrowing that could lead to left C6 nerve root compression.  There is moderate foraminal narrowing at C6-C7 on the left but no narrowing on the right  REPORTS REVIEWED Angiogram 06/14/2019: IMPRESSION:Morgan [name] has an occluded left subclavian artery stent placed 10 years ago at [facility]. 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 I think the best option would be left common carotid to subclavian bypass  MRI cervical spine 08/15/2019 (images also personally reviewed) IMPRESSION: 1. Infiltrated soft tissues around the left carotid subclavian bypass which is widely patent and without pseudoaneurysm on contemporaneous neck MRA. Soft tissue infiltration is closely associated with the brachial plexus and could explain paresthesias. This could reflect perioperative hemorrhage or surgical site infection. 2. No impingement in the cervical spine  MR angiogram neck 08/15/2019 (images also personally reviewed) IMPRESSION: Chronic left proximal subclavian occluded stent with recent left carotid to subclavian bypass. There is regional soft tissue infiltration presumably related to surgery, see cervical MRI report. No pseudoaneurysm or graft stenosis. Antegrade flow is seen in the left vertebral artery.   ALLERGIES: Allergies  Allergen Reactions  . Ilosone [Erythromycin] Anaphylaxis  . Keflex [Cephalexin] Anaphylaxis  . Penicillins Anaphylaxis    Has patient had a PCN reaction causing immediate rash, facial/tongue/throat swelling, SOB or lightheadedness with hypotension: YES Has patient had a PCN reaction  causing severe rash involving mucus membranes or skin necrosis: NO Has patient had a PCN reaction that required hospitalizationNO Has patient had a PCN reaction occurring within the last 10 years: NO If all of the above answers are "NO", then may proceed with Cephalosporin use.  . Iron Other (See Comments)    Pt reports condition limiting Iron intake.  . Metformin And Related Other (See Comments)    UNSPECIFIED REACTION  Pt prefers to not take this medication Side effects  . Morphine And Related Other (See Comments)    Pt reports hallucinations    HOME MEDICATIONS:  Current Outpatient Medications:  .  Accu-Chek FastClix  Lancets MISC, 1 Package by Does not apply route 3 (three) times daily., Disp: 150 each, Rfl: 3 .  aspirin 81 MG tablet, Take 1 tablet (81 mg total) by mouth daily., Disp: 30 tablet, Rfl: 1 .  atorvastatin (LIPITOR) 80 MG tablet, Take 1 tablet (80 mg total) by mouth at bedtime., Disp: 30 tablet, Rfl: 11 .  Blood Glucose Monitoring Suppl (CONTOUR NEXT ONE) KIT, 1 kit by Does not apply route as directed., Disp: 1 kit, Rfl: 0 .  Cholecalciferol (VITAMIN D3) 25 MCG (1000 UT) CAPS, Take 1,000-2,000 Units by mouth See admin instructions. Take 2 tablets by mouth once a day on Mon/Wed/Fri and take 1 tablet on Sun/Tues/Thurs/Sat, Disp: , Rfl:  .  EPINEPHrine 0.3 mg/0.3 mL IJ SOAJ injection, Inject 0.3 mg into the muscle once as needed for anaphylaxis (for an anaphylactic reaction). , Disp: , Rfl: 1 .  FLUoxetine (PROZAC) 20 MG tablet, Take 60 mg by mouth daily. , Disp: , Rfl:  .  fluticasone-salmeterol (ADVAIR HFA) 115-21 MCG/ACT inhaler, Inhale 2 puffs into the lungs 2 (two) times daily., Disp: 1 Inhaler, Rfl: 1 .  furosemide (LASIX) 20 MG tablet, TAKE 1 TABLET BY MOUTH  TWICE DAILY, Disp: 180 tablet, Rfl: 3 .  GLUCAGON EMERGENCY 1 MG injection, Inject 1 mg into the vein once as needed (FOR ONSET OF HYPOGLYCEMIA). , Disp: , Rfl: 2 .  glucose blood (CONTOUR NEXT TEST) test strip, Use as instructed, Disp: 400 each, Rfl: 12 .  Insulin Glargine (LANTUS SOLOSTAR) 100 UNIT/ML Solostar Pen, Inject 42 Units into the skin 2 (two) times daily., Disp: 30 mL, Rfl: 11 .  insulin lispro (HUMALOG KWIKPEN) 100 UNIT/ML KwikPen, Inject 0.25 mLs (25 Units total) into the skin 3 (three) times daily. (Patient taking differently: Inject 25 Units into the skin 3 (three) times daily. 200U/ml), Disp: 15 mL, Rfl: 11 .  Insulin Pen Needle (PEN NEEDLES) 32G X 4 MM MISC, 1 Device by Does not apply route 5 (five) times daily., Disp: 150 each, Rfl: 11 .  lamoTRIgine (LAMICTAL) 200 MG tablet, Take 200 mg by mouth daily. , Disp: , Rfl:  .   levothyroxine (SYNTHROID, LEVOTHROID) 150 MCG tablet, Take 150 mcg by mouth at bedtime. 3:30 Am, Disp: , Rfl:  .  losartan (COZAAR) 100 MG tablet, Take 1 tablet (100 mg total) by mouth daily., Disp: 90 tablet, Rfl: 3 .  Lurasidone HCl (LATUDA) 60 MG TABS, Take 60 mg by mouth at bedtime. , Disp: , Rfl:  .  metoprolol tartrate (LOPRESSOR) 50 MG tablet, Take 1 tablet (50 mg total) by mouth 2 (two) times daily., Disp: 180 tablet, Rfl: 3 .  Multiple Vitamin (MULTIVITAMIN WITH MINERALS) TABS tablet, Take 1 tablet by mouth daily. Woman 18 +, Disp: , Rfl:  .  nitroGLYCERIN (NITROSTAT) 0.4 MG SL tablet, Place 1 tablet (  0.4 mg total) under the tongue every 5 (five) minutes x 3 doses as needed for chest pain., Disp: 30 tablet, Rfl: 12 .  nystatin (MYCOSTATIN/NYSTOP) powder, Apply topically as needed. (Patient taking differently: Apply 1 g topically daily as needed (infection). ), Disp: 15 g, Rfl: 0 .  nystatin ointment (MYCOSTATIN), Apply 1 application topically daily as needed (infection)., Disp: , Rfl:  .  pantoprazole (PROTONIX) 40 MG tablet, Take 1 tablet (40 mg total) by mouth 2 (two) times daily before a meal. X 8 weeks (Patient taking differently: Take 20 mg by mouth daily. ), Disp: 60 tablet, Rfl: 3 .  potassium chloride (KLOR-CON) 10 MEQ tablet, Take 10 mEq by mouth 2 (two) times daily., Disp: , Rfl:  .  SUMAtriptan (IMITREX) 25 MG tablet, Take 1 tablet (25 mg total) by mouth every 2 (two) hours as needed for migraine. May repeat in 2 hours if headache persists or recurs. Please take as soon as you feel your migraine starting., Disp: 10 tablet, Rfl: 0 .  ticagrelor (BRILINTA) 90 MG TABS tablet, Take 1 tablet (90 mg total) by mouth 2 (two) times daily., Disp: 180 tablet, Rfl: 3 .  topiramate (TOPAMAX) 25 MG tablet, Take 1 tablet (25 mg total) by mouth at bedtime., Disp: 30 tablet, Rfl: 1 .  topiramate (TOPAMAX) 50 MG tablet, Take 1 tablet (50 mg total) by mouth at bedtime., Disp: 30 tablet, Rfl: 1 .   traMADol (ULTRAM) 50 MG tablet, Take 50 mg by mouth every 6 (six) hours as needed for moderate pain (Migraine)., Disp: , Rfl:  .  VENTOLIN HFA 108 (90 Base) MCG/ACT inhaler, INHALE 2 INHALATIONS BY  MOUTH INTO THE LUNGS TWICE  DAILY AS NEEDED FOR  WHEEZING OR SHORTNESS OF  BREATH, Disp: 36 g, Rfl: 2  PAST MEDICAL HISTORY: Past Medical History:  Diagnosis Date  . Anxiety   . Asthma   . Bipolar depression ([place])   . Chronic bronchitis ([place])   . Chronic esophagogastric ulcer   . Chronic stomach ulcer   . Fibroid   . GERD (gastroesophageal reflux disease)   . Heart murmur   . Hepatic steatosis   . History of blood transfusion 2010   "related to subclavian stent"  . History of hiatal hernia   . Hyperlipidemia   . Hypertension   . Hypothyroid   . LFTs abnormal   . Migraine    "cerebral migraines; 1-2/month" (06/29/2018)  . Non-ST elevation (NSTEMI) myocardial infarction ([name]) 10/27/2018  . 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 Stroke [facility])    "I've had 2; most recent one was ~ 2015 or before; affected balance" (06/29/2018)  . Thalassemia minor   . Type II diabetes mellitus ([place])   . Vitamin D deficiency     PAST SURGICAL HISTORY: Past Surgical History:  Procedure Laterality Date  . ABDOMINAL HYSTERECTOMY  2015   "still have 1 ovary"  . AORTIC ARCH ANGIOGRAPHY  06/14/2019  . AORTIC ARCH ANGIOGRAPHY N/A 06/14/2019   Procedure: AORTIC ARCH ANGIOGRAPHY;  Surgeon: Lorretta Harp, MD;  Location: [place] CV LAB;  Service: Cardiovascular;  Laterality: N/A;  . BIOPSY  07/25/2018   Procedure: BIOPSY;  Surgeon: Thornton Park, MD;  Location: [facility] ENDOSCOPY;  Service: Gastroenterology;;  . CAROTID-SUBCLAVIAN BYPASS GRAFT Left 07/30/2019   Procedure: LEFT CAROTID-SUBCLAVIAN BYPASS GRAFT;  Surgeon: Marty Heck, MD;  Location: [place];  Service: Vascular;  Laterality: Left;  . CESAREAN  SECTION  1988; 1996  . CORONARY STENT  INTERVENTION N/A 10/27/2018   Procedure: CORONARY STENT INTERVENTION;  Surgeon: Sherren Mocha, MD;  Location: [place] CV LAB;  Service: Cardiovascular;  Laterality: N/A;  . ESOPHAGOGASTRODUODENOSCOPY (EGD) WITH PROPOFOL N/A 07/25/2018   Procedure: ESOPHAGOGASTRODUODENOSCOPY (EGD) WITH PROPOFOL;  Surgeon: Thornton Park, MD;  Location: [place];  Service: Gastroenterology;  Laterality: N/A;  . KNEE RECONSTRUCTION Left 1980  . LAPAROSCOPIC CHOLECYSTECTOMY    . LEFT HEART CATH AND CORONARY ANGIOGRAPHY N/A 10/27/2018   Procedure: LEFT HEART CATH AND CORONARY ANGIOGRAPHY;  Surgeon: Sherren Mocha, MD;  Location: [place] CV LAB;  Service: Cardiovascular;  Laterality: N/A;  . SUBCLAVIAN STENT PLACEMENT Left 2010  . TONSILLECTOMY  1970  . UPPER EXTREMITY ANGIOGRAPHY Right 06/14/2019   Procedure: UPPER EXTREMITY ANGIOGRAPHY;  Surgeon: Lorretta Harp, MD;  Location: [place] CV LAB;  Service: Cardiovascular;  Laterality: Right;  upper ext    FAMILY HISTORY: Family History  Problem Relation Age of Onset  . Diabetes Mother   . Hypertension Mother   . Hyperlipidemia Mother   . CAD Mother   . Cervical cancer Mother   . CAD Father   . Heart failure Father   . Testicular cancer Father   . Hypertension Sister   . Hyperlipidemia Sister   . Hypertension Brother   . Hyperlipidemia Brother      PHYSICAL EXAM  There were no vitals filed for this visit.  There is no height or weight on file to calculate BMI.   General: The patient is well-developed and well-nourished and in no acute distress  Neck: The neck is nontender.  Neurologic Exam  Mental status: The patient is alert and oriented x 3 at the time of the examination. The patient has apparent normal recent and remote memory, with an apparently normal attention span and concentration ability.   Speech is normal.  Cranial nerves: Extraocular movements are full.  Facial strength is normal.  Trapezius and  sternocleidomastoid strength is normal.    Motor:  Muscle bulk is normal.   Tone is normal.  Strength was 4+ in the left and 5 in the right biceps, 4+ in the left and 4++ in the right triceps, 4+ in the left and 5 in the right deltoid.  Strength was 4+/5 in hand intrinsic musckes.  Sensory: She had Tinel's signs at both wrists.  The sensory exam showed reduced sensation to touch in both hands involving the first 4 fingers more than the fifth digit.    Gait and station: Station is normal.   Gait is fairly normal.   Reflexes: Deep tendon reflexes are symmetric and normal bilaterally.         ASSESSMENT AND PLAN  Carpal tunnel syndrome, bilateral - Plan: NCV with EMG(electromyography)  Arm weakness - Plan: NCV with EMG(electromyography)  Bilateral hand numbness - Plan: NCV with EMG(electromyography)   1.   I discussed with Morgan [name] that the EMG/NCV study showed moderate bilateral carpal tunnel syndrome and mild bilateral ulnar neuropathies at the wrists.  There is no significant radiculopathy.  I am still concerned that her symptoms could be due to a mild myelopathy that may have occurred related to her surgery due to her existing spinal stenosis.  Unfortunately, the quality of the MRI from 08/15/2019 was poor due to movement artifact.  We discussed that due to her continuing symptoms and my concern about her spinal stenosis, we need to check another MRI.  To help her stay  still, I will prescribe some Xanax that she can take before the procedure.  If she does have evidence of myelopathy and the moderate spinal stenosis is confirmed, commend that she get a neurosurgical opinion 2.    In the meantime she is advised to try to stay active but avoid sustained extreme movements of the neck 3.    She will return to see me as needed based on the results of the study  Leaann Nevils A. Felecia Shelling, MD, [facility] 0/04/1979, 22:17 PM Certified in Neurology, Clinical Neurophysiology, Sleep Medicine and  Neuroimaging  [facility] [address] [place], [place] [address] [phone]

## 2019-10-15 ENCOUNTER — Telehealth: Payer: 59 | Admitting: Family Medicine

## 2019-10-16 ENCOUNTER — Telehealth: Payer: Self-pay | Admitting: Neurology

## 2019-10-16 NOTE — Telephone Encounter (Signed)
unable to reach patient voicemail was not set up  [place]: [place] via [facility]

## 2019-10-17 ENCOUNTER — Other Ambulatory Visit: Payer: Self-pay

## 2019-10-17 NOTE — Telephone Encounter (Signed)
I spoke to the patient she is scheduled at [facility] for 10/23/19.

## 2019-10-18 ENCOUNTER — Encounter: Payer: Self-pay | Admitting: Family Medicine

## 2019-10-18 NOTE — Telephone Encounter (Signed)
Received another copy of disability forms, however these need to go to Neurologists. Per chart notes, looks like her Neurologist has been working on these. Will save this copy in nurse room in purple folder, in case she needs them. Patient has an up coming apt with pcp on 2/22.

## 2019-10-19 ENCOUNTER — Inpatient Hospital Stay (HOSPITAL_COMMUNITY): Admission: RE | Admit: 2019-10-19 | Payer: 59 | Source: Ambulatory Visit

## 2019-10-19 ENCOUNTER — Ambulatory Visit: Payer: 59 | Admitting: Internal Medicine

## 2019-10-19 ENCOUNTER — Other Ambulatory Visit: Payer: Self-pay

## 2019-10-19 NOTE — Progress Notes (Deleted)
PATIENT IDENTIFIER: Morgan [name] is a 54 y.o. female with a past medical history of IDDM, HTN, Bipolar d/o,hypothyroidism, Hx of CVA, and  CAD (S/P DES 10/2018) . The patient has followed with Endocrinology clinic since 09/25/2018 for consultative assistance with management of her diabetes.  DIABETIC HISTORY:  Morgan [name] was diagnosed with T2DM many years ago. She is intolerant to Metformin due to diarrhea. She has been on SU and actos in the past without reported intolerance. She has been on an insulin pump since 2017. Her hemoglobin A1c has ranged from 9.8% in 06/2018, peaking at 13.2% in 2017.   Works 11 AM to 10 pm for [employer]   On her initial presentation to our clinic her A1c was 10.4 %. She was on medtronic pump with a daytime hourly rate of 8 u/hr from 9 am to MN. She was also on Ozempic 0.5 mg weekly.   She is intolerant to Ozempicdue to GI side effects   In 05/2019 she presented to the ED with nausea and vomiting, found to have a BG 870 mg/dL with severe acidosis and was switched from the pump to MDI regimen. She was NOT restarted on the pump again as she has been a poor candidate for it.   Historically she has had incidents where the pump would malfunction and the patient would not be aware of this and had presented to Ed with Bg's in the 700's   SUBJECTIVE:   During the last visit (07/20/2019): This was a hospital follow up , her pump was discontinued and discharged on MDI regimen. .   Today (10/19/2019): Morgan [name] is here for a follow up on diabetes management. She has continued with hyperglycemia and had seen her PCP in the interm, pt is currently on Lantus 35 units BID. She assures me compliance with insulin intake, she does take humalog with coffee but she states that she eats when she drinks coffee too.   She is schedule to have a left subclavian graft next week due to 100 % occluded stent.   ROS: As per HPI and as detailed below: Review of Systems    Constitutional: Negative for fever and malaise/fatigue.  HENT: Negative for congestion and sore throat.   Respiratory: Positive for shortness of breath. Negative for cough.   Cardiovascular: Positive for chest pain. Negative for palpitations.  Gastrointestinal: Positive for diarrhea. Negative for nausea.      HOME DIABETES REGIMEN:  Lantus 35  units BID Humalog 20 units with each meal   METER DOWNLOAD SUMMARY: 11/19-12/18/2020 Fingerstick Blood Glucose Tests = 17 Average Number Tests/Day = 0.6 Overall Mean FS Glucose = 490 Standard Deviation = 109  BG Ranges: Low = 282 High = High   Hypoglycemic Events/30 Days: BG < 50 = 0 Episodes of symptomatic severe hypoglycemia = 0      HISTORY:  Past Medical History:  Past Medical History:  Diagnosis Date  . Anxiety   . Asthma   . Bipolar depression ([place])   . Chronic bronchitis ([place])   . Chronic esophagogastric ulcer   . Chronic stomach ulcer   . Fibroid   . GERD (gastroesophageal reflux disease)   . Heart murmur   . Hepatic steatosis   . History of blood transfusion 2010   "related to subclavian stent"  . History of hiatal hernia   . Hyperlipidemia   . Hypertension   . Hypothyroid   . LFTs abnormal   . Migraine    "  cerebral migraines; 1-2/month" (06/29/2018)  . Non-ST elevation (NSTEMI) myocardial infarction ([place]) 10/27/2018  . Occlusion of brachial artery ([place]) 2010  . 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 Stroke [facility])    "I've had 2; most recent one was ~ 2015 or before; affected balance" (06/29/2018)  . Thalassemia minor   . Type II diabetes mellitus ([place])   . Vitamin D deficiency    Past Surgical History:  Past Surgical History:  Procedure Laterality Date  . ABDOMINAL HYSTERECTOMY  2015   "still have 1 ovary"  . AORTIC ARCH ANGIOGRAPHY  06/14/2019  . AORTIC ARCH ANGIOGRAPHY N/A 06/14/2019   Procedure: AORTIC ARCH ANGIOGRAPHY;  Surgeon: Lorretta Harp, MD;  Location: [place] CV LAB;   Service: Cardiovascular;  Laterality: N/A;  . BIOPSY  07/25/2018   Procedure: BIOPSY;  Surgeon: Thornton Park, MD;  Location: [facility] ENDOSCOPY;  Service: Gastroenterology;;  . CAROTID-SUBCLAVIAN BYPASS GRAFT Left 07/30/2019   Procedure: LEFT CAROTID-SUBCLAVIAN BYPASS GRAFT;  Surgeon: Marty Heck, MD;  Location: [place];  Service: Vascular;  Laterality: Left;  . CESAREAN SECTION  1988; 1996  . CORONARY STENT INTERVENTION N/A 10/27/2018   Procedure: CORONARY STENT INTERVENTION;  Surgeon: Sherren Mocha, MD;  Location: [place] CV LAB;  Service: Cardiovascular;  Laterality: N/A;  . ESOPHAGOGASTRODUODENOSCOPY (EGD) WITH PROPOFOL N/A 07/25/2018   Procedure: ESOPHAGOGASTRODUODENOSCOPY (EGD) WITH PROPOFOL;  Surgeon: Thornton Park, MD;  Location: [place];  Service: Gastroenterology;  Laterality: N/A;  . KNEE RECONSTRUCTION Left 1980  . LAPAROSCOPIC CHOLECYSTECTOMY    . LEFT HEART CATH AND CORONARY ANGIOGRAPHY N/A 10/27/2018   Procedure: LEFT HEART CATH AND CORONARY ANGIOGRAPHY;  Surgeon: Sherren Mocha, MD;  Location: [place] CV LAB;  Service: Cardiovascular;  Laterality: N/A;  . SUBCLAVIAN STENT PLACEMENT Left 2010  . TONSILLECTOMY  1970  . UPPER EXTREMITY ANGIOGRAPHY Right 06/14/2019   Procedure: UPPER EXTREMITY ANGIOGRAPHY;  Surgeon: Lorretta Harp, MD;  Location: [place] CV LAB;  Service: Cardiovascular;  Laterality: Right;  upper ext    Social History:  reports that she quit smoking about 5 years ago. Her smoking use included cigarettes. She has a 72.00 pack-year smoking history. She has never used smokeless tobacco. She reports previous alcohol use. She reports previous drug use. Family History:  Family History  Problem Relation Age of Onset  . Diabetes Mother   . Hypertension Mother   . Hyperlipidemia Mother   . CAD Mother   . Cervical cancer Mother   . CAD Father   . Heart failure Father   . Testicular cancer Father   . Hypertension Sister   .  Hyperlipidemia Sister   . Hypertension Brother   . Hyperlipidemia Brother      HOME MEDICATIONS: Allergies as of 10/19/2019      Reactions   Ilosone [erythromycin] Anaphylaxis   Keflex [cephalexin] Anaphylaxis   Penicillins Anaphylaxis   Has patient had a PCN reaction causing immediate rash, facial/tongue/throat swelling, SOB or lightheadedness with hypotension: YES Has patient had a PCN reaction causing severe rash involving mucus membranes or skin necrosis: NO Has patient had a PCN reaction that required hospitalizationNO Has patient had a PCN reaction occurring within the last 10 years: NO If all of the above answers are "NO", then may proceed with Cephalosporin use.   Iron Other (See Comments)   Pt reports condition limiting Iron intake.   Metformin And Related Other (See Comments)   UNSPECIFIED REACTION  Pt prefers to not take this medication Side  effects   Morphine And Related Other (See Comments)   Pt reports hallucinations      Medication List       Accurate as of October 19, 2019 12:45 PM. If you have any questions, ask your nurse or doctor.        Accu-Chek FastClix Lancets Misc 1 Package by Does not apply route 3 (three) times daily.   ALPRAZolam 0.5 MG tablet Commonly known as: XANAX Take 2 or 3 pills before the MRI.   aspirin 81 MG tablet Take 1 tablet (81 mg total) by mouth daily.   atorvastatin 80 MG tablet Commonly known as: LIPITOR Take 1 tablet (80 mg total) by mouth at bedtime.   Contour Next One Kit 1 kit by Does not apply route as directed.   Contour Next Test test strip Generic drug: glucose blood Use as instructed   EPINEPHrine 0.3 mg/0.3 mL Soaj injection Commonly known as: EPI-PEN Inject 0.3 mg into the muscle once as needed for anaphylaxis (for an anaphylactic reaction).   FLUoxetine 20 MG tablet Commonly known as: PROZAC Take 60 mg by mouth daily.   fluticasone-salmeterol 115-21 MCG/ACT inhaler Commonly known as: ADVAIR  HFA Inhale 2 puffs into the lungs 2 (two) times daily.   furosemide 20 MG tablet Commonly known as: LASIX TAKE 1 TABLET BY MOUTH  TWICE DAILY   Glucagon Emergency 1 MG Kit Inject 1 mg into the vein once as needed (FOR ONSET OF HYPOGLYCEMIA).   insulin lispro 100 UNIT/ML KwikPen Commonly known as: HumaLOG KwikPen Inject 0.25 mLs (25 Units total) into the skin 3 (three) times daily. What changed: additional instructions   lamoTRIgine 200 MG tablet Commonly known as: LAMICTAL Take 200 mg by mouth daily.   Lantus SoloStar 100 UNIT/ML Solostar Pen Generic drug: insulin glargine Inject 42 Units into the skin 2 (two) times daily.   Latuda 60 MG Tabs Generic drug: Lurasidone HCl Take 60 mg by mouth at bedtime.   levothyroxine 150 MCG tablet Commonly known as: SYNTHROID Take 150 mcg by mouth at bedtime. 3:30 Am   losartan 100 MG tablet Commonly known as: COZAAR Take 1 tablet (100 mg total) by mouth daily.   metoprolol tartrate 50 MG tablet Commonly known as: LOPRESSOR Take 1 tablet (50 mg total) by mouth 2 (two) times daily.   multivitamin with minerals Tabs tablet Take 1 tablet by mouth daily. Woman 50 +   nitroGLYCERIN 0.4 MG SL tablet Commonly known as: NITROSTAT Place 1 tablet (0.4 mg total) under the tongue every 5 (five) minutes x 3 doses as needed for chest pain.   nystatin ointment Commonly known as: MYCOSTATIN Apply 1 application topically daily as needed (infection). What changed: Another medication with the same name was changed. Make sure you understand how and when to take each.   nystatin powder Commonly known as: MYCOSTATIN/NYSTOP Apply topically as needed. What changed:   how much to take  when to take this  reasons to take this   pantoprazole 40 MG tablet Commonly known as: PROTONIX Take 1 tablet (40 mg total) by mouth 2 (two) times daily before a meal. X 8 weeks What changed:   how much to take  when to take this  additional  instructions   Pen Needles 32G X 4 MM Misc 1 Device by Does not apply route 5 (five) times daily.   potassium chloride 10 MEQ tablet Commonly known as: KLOR-CON Take 10 mEq by mouth 2 (two) times daily.   SUMAtriptan 25  MG tablet Commonly known as: Imitrex Take 1 tablet (25 mg total) by mouth every 2 (two) hours as needed for migraine. May repeat in 2 hours if headache persists or recurs. Please take as soon as you feel your migraine starting.   ticagrelor 90 MG Tabs tablet Commonly known as: BRILINTA Take 1 tablet (90 mg total) by mouth 2 (two) times daily.   topiramate 50 MG tablet Commonly known as: TOPAMAX Take 1 tablet (50 mg total) by mouth at bedtime.   topiramate 25 MG tablet Commonly known as: Topamax Take 1 tablet (25 mg total) by mouth at bedtime.   traMADol 50 MG tablet Commonly known as: ULTRAM Take 50 mg by mouth every 6 (six) hours as needed for moderate pain (Migraine).   Ventolin HFA 108 (90 Base) MCG/ACT inhaler Generic drug: albuterol INHALE 2 INHALATIONS BY  MOUTH INTO THE LUNGS TWICE  DAILY AS NEEDED FOR  WHEEZING OR SHORTNESS OF  BREATH   Vitamin D3 25 MCG (1000 UT) Caps Take 1,000-2,000 Units by mouth See admin instructions. Take 2 tablets by mouth once a day on Mon/Wed/Fri and take 1 tablet on Sun/Tues/Thurs/Sat      PHYSICAL EXAM: VS: LMP 08/03/2013    EXAM: General: Pt appears well and is in NAD  Lungs: Clear with good BS bilat with no rales, rhonchi, or wheezes  Heart: Auscultation: RRR   Extremities: BL LE: no pretibial edema normal   Neuro: Cranial nerves: II - XII grossly intact ;  Motor: normal strength throughout  Mental Status: Mood and affect: no depression, anxiety, or agitation    DM Foot exam (07/20/2019) The skin of the feet is without sores or ulcerations. The pedal pulses are 1+ on right and 1+ on left. The sensation is decreased to a screening 5.07, 10 gram monofilament bilaterally   DATA REVIEWED:  Lab Results   Component Value Date   HGBA1C 14.2 (H) 07/30/2019   HGBA1C 12.6 (H) 05/12/2019   HGBA1C 10.4 (A) 01/30/2019   Lab Results  Component Value Date   LDLCALC 50 06/15/2019   CREATININE 0.98 08/14/2019     ASSESSMENT / PLAN / RECOMMENDATIONS:   1) Type 2 Diabetes Mellitus, Poorly controlled, With macrovascular complications - Most recent A1c of 12.6 %. Goal A1c < 7.0 %.   - We discussed the importance of frequent BG checks , as this helps me make better decisions about her insulin regimen. I have advised to try and check glucose before meals and at bedtime when possible - She is currently not on enough prandial insulin - Pt may contact us next week if BG's consistently > 250 mg/dL, our goal is to improve glycemic control as quickly as possible in preparation of her procedure next week - She has been advised to avoid sugar sweetened beverages, which she does for the most part except for the occasional intake of juice.   MEDICATIONS:  - Continue  Lantus 35 units BID - Increase  Humalog to 20  units with each meal   EDUCATION / INSTRUCTIONS:  BG monitoring instructions: Patient is instructed to check her blood sugars 4 times a day, before meals and bedtime.  Call [place] [facility] if: BG persistently < 70 or > 300. . I reviewed the Rule of 15 for the treatment of hypoglycemia in detail with the patient. Literature supplied.     F/U in 3 months    Signed electronically by: Mack Guise, MD  [facility]  [facility]  [place], [place] [address] Phone: [phone] FAX: [phone]   CC: [place], [place], [place] [address] Phone: [phone]  Fax: [phone]  Return to Endocrinology clinic as below: Future Appointments  Date Time Provider [place]  10/19/2019  3:00 PM Tamika Shropshire, Melanie Crazier, MD [facility] None  10/22/2019  1:30 PM [place], [place], DO  [facility]  10/23/2019  1:30 PM [facility]LE MRI [facility] None  10/24/2019  3:30 PM Lorretta Harp, MD [facility]

## 2019-10-22 ENCOUNTER — Ambulatory Visit (HOSPITAL_COMMUNITY)
Admission: RE | Admit: 2019-10-22 | Discharge: 2019-10-22 | Disposition: A | Discharge status: Home / Self Care | Payer: 59 | Source: Ambulatory Visit | Attending: Family Medicine | Admitting: Family Medicine

## 2019-10-22 ENCOUNTER — Ambulatory Visit (INDEPENDENT_AMBULATORY_CARE_PROVIDER_SITE_OTHER): Payer: 59 | Admitting: Family Medicine

## 2019-10-22 ENCOUNTER — Encounter: Payer: Self-pay | Admitting: Family Medicine

## 2019-10-22 ENCOUNTER — Other Ambulatory Visit: Payer: Self-pay

## 2019-10-22 VITALS — BP 122/68 | HR 85 | Ht 63.0 in | Wt 227.4 lb

## 2019-10-22 DIAGNOSIS — R0602 Shortness of breath: Secondary | ICD-10-CM | POA: Diagnosis present

## 2019-10-22 DIAGNOSIS — R0600 Dyspnea, unspecified: Secondary | ICD-10-CM | POA: Diagnosis not present

## 2019-10-22 DIAGNOSIS — R0609 Other forms of dyspnea: Secondary | ICD-10-CM

## 2019-10-22 DIAGNOSIS — G5603 Carpal tunnel syndrome, bilateral upper limbs: Secondary | ICD-10-CM | POA: Diagnosis not present

## 2019-10-22 DIAGNOSIS — F319 Bipolar disorder, unspecified: Secondary | ICD-10-CM | POA: Diagnosis not present

## 2019-10-22 MED ORDER — DICLOFENAC SODIUM 1 % EX GEL: 4.0000 g | Freq: Four times a day (QID) | CUTANEOUS | 0 refills | Status: DC

## 2019-10-22 NOTE — Patient Instructions (Addendum)
Thank you for coming to see me today. It was a pleasure! Today we talked about:   Your oxygen level did not drop here and your EKG does not show any signs of an acute issue.  I do recommend that you schedule an appointment to follow-up with your cardiologist since you missed the last 1.  I have also provided psychiatry resources below.  Please reach out to one of them.  I have also placed a referral if this will help.  I will fill out your form again and fax it to you for a copy.  For your carpal tunnel, please get braces to wear on your bilateral wrists most of the time.  Using them at night is very important.  You may also use Voltaren gel on your wrist which is an anti-inflammatory cream that may help with your symptoms.  Please be sure to follow-up with neurology.  Please follow-up with me in 3 months or sooner as needed.  If you have any questions or concerns, please do not hesitate to call the office at [phone].  Take Care,   [place] Deserae Jennings, DO  Psychiatry Resource List (Adults and Children) Most of these providers will take Medicaid. please consult your insurance for a complete and updated list of available providers. When calling to make an appointment have your insurance information available to confirm you are covered.  [place]:   [place]: [address]     [phone] [place]: [place],        [phone] [place]: [address] [place],    [place]: [address] [place],                   [place]  (Psychiatry & counseling ; adults & children ; will take Medicaid) [place], [place], [place]        [phone]  [place]  (Psychiatry only; Adults only, will take Medicaid)  [place], [place], [place] [address]       [phone]  [place] (Psychiatry & counseling ; adults & children ; will take Medicaid [address]  [place] [address]   [phone]    ([place] therapist)  [facility]  Psychiatry & counseling; Adults and children;  [address]    [place], [place] [address]    [phone]  [facility] (Psychiatry & counseling; adults & children; Medicare no Medicaid)  [place], [place]  [address]      [phone]    [facility] (up to age 19)  Psychiatry & counseling ,will take Medicaid, must do counseling to receive psychiatry services  [address] [place] [address]        ([place] (Psychiatry & counseling; adults & children; will take Medicaid) [address],  [place], [place]  [phone]  [facility]---  Walk-in Mon-Fri, 8:30-5:00 (will take Medicaid)  [address] [place], [place]  [phone]   [facility] --- Walk-In Mon-Friday 8am-3pm ( will take Medicaid, Psychiatry, Adults & children,  [address], [place], [place]   [phone]  [facility] [place]--, Walk-in M-F 8am-12pm and 1pm -3pm   (Counseling, Psychiatry, will take Medicaid, adults & children)  [address], [place], [place]  [phone]  [phone]   

## 2019-10-22 NOTE — Progress Notes (Signed)
   SUBJECTIVE:   CHIEF COMPLAINT / HPI:   SOB on exertion: Patient reports that she is feeling short of breath. She recently missed her cardiology follow up but plans to re-schedule with them soon. She denies any chest pain or extremity swelling with the episodes. Denies any respiratory symptoms. She has been having more anxiety than usual and says that may be contributing.   BL Carpal Tunnel: Patient reports that she was dx with bilateral tunnel.  On review of EMG it looks as if patient has moderate median nerve neuropathy bilaterally as well as mild ulnar nerve neuropathies bilaterally.  She is currently following with neurology.  She states that she is also frustrated due to her company not allowing her to go back to work at her term disability.  She is asking more [place] only she developed time to take her know due to the numbness.  She states that she is currently starting a note for her carpal tunnel that was diagnosed in the office. She was also not given braces.  She does state that she has an MRI of her cervical spine on 3/23 as the neurologist believes that a lot of her symptoms may also be coming from her neck.  She reports that her symptoms have been stable and have not worsened.  She denies any weakness in her arms.  PERTINENT  PMH / PSH: Bilateral carpal tunnel, poorly controlled T2DM, bipolar, HTN, HFpEF  OBJECTIVE:  BP 122/68   Pulse 85   Ht 5\' 3"  (1.6 m)   Wt 227 lb 6 oz (103.1 kg)   LMP 08/03/2013   SpO2 96%   BMI 40.28 kg/m   General: NAD, pleasant Neck: Supple, no LAD Respiratory: normal work of breathing Neuro: CN II-XII grossly intact Psych: AOx3, appropriate affect  ASSESSMENT/PLAN:   Carpal tunnel syndrome, bilateral Patient provided with prescription for bilateral carpal tunnel braces for her to wear as often as she is able.  She is to follow-up with neurology after receiving MRI of her C-spine as they believe that this is also contributing to her symptoms.   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   -In addition to the braces patient may also trial Voltaren gel on her bilateral wrists. -May need to consider oral NSAIDs if symptoms -Corticosteroid injection is a consideration to help with pain and inflammation if not improving -Follow-up with neurology or with [place] in 6 weeks if not improving   Dyspnea on exertion Patient reporting dyspnea on exertion over the past few weeks.  She denies any lower extremity swelling or chest pain. Last echo on 04/2019 with EF >65% with no evidence of damage from prior MI.  She has a cardiologist that she normally sees however missed her last appointment.  Encourage patient to schedule follow-up with her cardiologist in order to discuss her symptoms.  EKG obtained here in the office which is unchanged from prior.  Patient ambulated with pulse ox with only desat to 96%.  No other respiratory symptoms at this time.  May consider repeat echo if patient is unable to follow-up with cardiologist and symptoms persist.  Patient also believes that anxiety may be contributing to her shortness of breath and she was given resources to try to find a new psychiatrist that she is having trouble reaching her current one.  Should return precautions discussed, patient voiced understanding.    [place] Alorah Mcree, DO PGY-3, [facility]

## 2019-10-23 ENCOUNTER — Ambulatory Visit: Payer: 59

## 2019-10-23 DIAGNOSIS — R200 Anesthesia of skin: Secondary | ICD-10-CM | POA: Diagnosis not present

## 2019-10-23 DIAGNOSIS — M4802 Spinal stenosis, cervical region: Secondary | ICD-10-CM

## 2019-10-23 DIAGNOSIS — R29898 Other symptoms and signs involving the musculoskeletal system: Secondary | ICD-10-CM

## 2019-10-24 ENCOUNTER — Ambulatory Visit: Payer: 59 | Admitting: Cardiovascular Disease

## 2019-10-24 ENCOUNTER — Ambulatory Visit: Payer: 59 | Admitting: Internal Medicine

## 2019-10-25 DIAGNOSIS — R0609 Other forms of dyspnea: Secondary | ICD-10-CM | POA: Insufficient documentation

## 2019-10-25 NOTE — Assessment & Plan Note (Addendum)
Patient provided with prescription for bilateral carpal tunnel braces for her to wear as often as she is able.  She is to follow-up with neurology after receiving MRI of her C-spine as they believe that this is also contributing to her symptoms.  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   -In addition to the braces patient may also trial Voltaren gel on her bilateral wrists. -May need to consider oral NSAIDs if symptoms -Corticosteroid injection is a consideration to help with pain and inflammation if not improving -Follow-up with neurology or with [place] in 6 weeks if not improving

## 2019-10-25 NOTE — Assessment & Plan Note (Addendum)
Patient reporting dyspnea on exertion over the past few weeks.  She denies any lower extremity swelling or chest pain. Last echo on 04/2019 with EF >65% with no evidence of damage from prior MI.  She has a cardiologist that she normally sees however missed her last appointment.  Encourage patient to schedule follow-up with her cardiologist in order to discuss her symptoms.  EKG obtained here in the office which is unchanged from prior.  Patient ambulated with pulse ox with only desat to 96%.  No other respiratory symptoms at this time.  May consider repeat echo if patient is unable to follow-up with cardiologist and symptoms persist.  Patient also believes that anxiety may be contributing to her shortness of breath and she was given resources to try to find a new psychiatrist that she is having trouble reaching her current one.  Should return precautions discussed, patient voiced understanding.

## 2019-11-01 ENCOUNTER — Telehealth: Payer: Self-pay | Admitting: *Deleted

## 2019-11-01 NOTE — Telephone Encounter (Signed)
Called pt and relayed results per Dr. Felecia Shelling note. She verbalized understanding. States weakness about the same, has not worsened. She will call back if it worsens any in the future and knows at that point we can place referral to neurosurgery to be further evaluated.

## 2019-11-01 NOTE — Telephone Encounter (Signed)
-----   Message from Britt Bottom, MD sent at 10/31/2019  5:56 PM EDT ----- Please let her know that the MRI of the cervical spine looked about the same as the previous one.  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  If she feels the weakness is worsening, we could refer to neurosurgery.

## 2019-11-14 ENCOUNTER — Ambulatory Visit: Payer: 59 | Admitting: Internal Medicine

## 2019-11-24 ENCOUNTER — Observation Stay (HOSPITAL_COMMUNITY): Payer: 59

## 2019-11-24 ENCOUNTER — Inpatient Hospital Stay (HOSPITAL_COMMUNITY)
Admission: EM | Admit: 2019-11-24 | Discharge: 2019-11-27 | DRG: 637 | Disposition: A | Discharge status: Home Health | Payer: 59 | Attending: Family Medicine | Admitting: Family Medicine

## 2019-11-24 ENCOUNTER — Encounter (HOSPITAL_COMMUNITY): Payer: Self-pay | Admitting: Student

## 2019-11-24 ENCOUNTER — Emergency Department (HOSPITAL_COMMUNITY): Payer: 59

## 2019-11-24 ENCOUNTER — Other Ambulatory Visit: Payer: Self-pay

## 2019-11-24 DIAGNOSIS — I708 Atherosclerosis of other arteries: Secondary | ICD-10-CM | POA: Diagnosis present

## 2019-11-24 DIAGNOSIS — E1110 Type 2 diabetes mellitus with ketoacidosis without coma: Principal | ICD-10-CM

## 2019-11-24 DIAGNOSIS — Z881 Allergy status to other antibiotic agents status: Secondary | ICD-10-CM

## 2019-11-24 DIAGNOSIS — Z8049 Family history of malignant neoplasm of other genital organs: Secondary | ICD-10-CM

## 2019-11-24 DIAGNOSIS — Z90710 Acquired absence of both cervix and uterus: Secondary | ICD-10-CM

## 2019-11-24 DIAGNOSIS — Z888 Allergy status to other drugs, medicaments and biological substances status: Secondary | ICD-10-CM

## 2019-11-24 DIAGNOSIS — E11649 Type 2 diabetes mellitus with hypoglycemia without coma: Secondary | ICD-10-CM | POA: Diagnosis not present

## 2019-11-24 DIAGNOSIS — R739 Hyperglycemia, unspecified: Secondary | ICD-10-CM | POA: Diagnosis present

## 2019-11-24 DIAGNOSIS — I110 Hypertensive heart disease with heart failure: Secondary | ICD-10-CM | POA: Diagnosis present

## 2019-11-24 DIAGNOSIS — Z8249 Family history of ischemic heart disease and other diseases of the circulatory system: Secondary | ICD-10-CM

## 2019-11-24 DIAGNOSIS — E785 Hyperlipidemia, unspecified: Secondary | ICD-10-CM | POA: Diagnosis present

## 2019-11-24 DIAGNOSIS — R0602 Shortness of breath: Secondary | ICD-10-CM

## 2019-11-24 DIAGNOSIS — I2510 Atherosclerotic heart disease of native coronary artery without angina pectoris: Secondary | ICD-10-CM | POA: Diagnosis present

## 2019-11-24 DIAGNOSIS — I252 Old myocardial infarction: Secondary | ICD-10-CM

## 2019-11-24 DIAGNOSIS — R112 Nausea with vomiting, unspecified: Secondary | ICD-10-CM | POA: Diagnosis not present

## 2019-11-24 DIAGNOSIS — M4802 Spinal stenosis, cervical region: Secondary | ICD-10-CM

## 2019-11-24 DIAGNOSIS — Z83438 Family history of other disorder of lipoprotein metabolism and other lipidemia: Secondary | ICD-10-CM

## 2019-11-24 DIAGNOSIS — Z8673 Personal history of transient ischemic attack (TIA), and cerebral infarction without residual deficits: Secondary | ICD-10-CM

## 2019-11-24 DIAGNOSIS — Z833 Family history of diabetes mellitus: Secondary | ICD-10-CM

## 2019-11-24 DIAGNOSIS — Z9049 Acquired absence of other specified parts of digestive tract: Secondary | ICD-10-CM

## 2019-11-24 DIAGNOSIS — R000 Tachycardia, unspecified: Secondary | ICD-10-CM | POA: Diagnosis present

## 2019-11-24 DIAGNOSIS — Z79891 Long term (current) use of opiate analgesic: Secondary | ICD-10-CM

## 2019-11-24 DIAGNOSIS — Z794 Long term (current) use of insulin: Secondary | ICD-10-CM

## 2019-11-24 DIAGNOSIS — Z7982 Long term (current) use of aspirin: Secondary | ICD-10-CM

## 2019-11-24 DIAGNOSIS — M545 Low back pain: Secondary | ICD-10-CM | POA: Diagnosis present

## 2019-11-24 DIAGNOSIS — D563 Thalassemia minor: Secondary | ICD-10-CM | POA: Diagnosis present

## 2019-11-24 DIAGNOSIS — Z9114 Patient's other noncompliance with medication regimen: Secondary | ICD-10-CM

## 2019-11-24 DIAGNOSIS — Z9119 Patient's noncompliance with other medical treatment and regimen: Secondary | ICD-10-CM

## 2019-11-24 DIAGNOSIS — Z87891 Personal history of nicotine dependence: Secondary | ICD-10-CM

## 2019-11-24 DIAGNOSIS — K760 Fatty (change of) liver, not elsewhere classified: Secondary | ICD-10-CM | POA: Diagnosis present

## 2019-11-24 DIAGNOSIS — F418 Other specified anxiety disorders: Secondary | ICD-10-CM | POA: Diagnosis present

## 2019-11-24 DIAGNOSIS — E1165 Type 2 diabetes mellitus with hyperglycemia: Secondary | ICD-10-CM

## 2019-11-24 DIAGNOSIS — J9811 Atelectasis: Secondary | ICD-10-CM | POA: Diagnosis present

## 2019-11-24 DIAGNOSIS — T383X6A Underdosing of insulin and oral hypoglycemic [antidiabetic] drugs, initial encounter: Secondary | ICD-10-CM | POA: Diagnosis present

## 2019-11-24 DIAGNOSIS — Z885 Allergy status to narcotic agent status: Secondary | ICD-10-CM

## 2019-11-24 DIAGNOSIS — Z955 Presence of coronary angioplasty implant and graft: Secondary | ICD-10-CM

## 2019-11-24 DIAGNOSIS — J189 Pneumonia, unspecified organism: Secondary | ICD-10-CM

## 2019-11-24 DIAGNOSIS — J45909 Unspecified asthma, uncomplicated: Secondary | ICD-10-CM | POA: Diagnosis present

## 2019-11-24 DIAGNOSIS — I5022 Chronic systolic (congestive) heart failure: Secondary | ICD-10-CM | POA: Diagnosis present

## 2019-11-24 DIAGNOSIS — R1084 Generalized abdominal pain: Secondary | ICD-10-CM | POA: Diagnosis present

## 2019-11-24 DIAGNOSIS — D72829 Elevated white blood cell count, unspecified: Secondary | ICD-10-CM | POA: Diagnosis present

## 2019-11-24 DIAGNOSIS — Z880 Allergy status to penicillin: Secondary | ICD-10-CM

## 2019-11-24 DIAGNOSIS — K219 Gastro-esophageal reflux disease without esophagitis: Secondary | ICD-10-CM | POA: Diagnosis present

## 2019-11-24 DIAGNOSIS — E876 Hypokalemia: Secondary | ICD-10-CM | POA: Diagnosis not present

## 2019-11-24 DIAGNOSIS — Z79890 Hormone replacement therapy (postmenopausal): Secondary | ICD-10-CM

## 2019-11-24 DIAGNOSIS — G43909 Migraine, unspecified, not intractable, without status migrainosus: Secondary | ICD-10-CM | POA: Diagnosis present

## 2019-11-24 DIAGNOSIS — F3130 Bipolar disorder, current episode depressed, mild or moderate severity, unspecified: Secondary | ICD-10-CM | POA: Diagnosis present

## 2019-11-24 DIAGNOSIS — E860 Dehydration: Secondary | ICD-10-CM | POA: Diagnosis present

## 2019-11-24 DIAGNOSIS — R0609 Other forms of dyspnea: Secondary | ICD-10-CM

## 2019-11-24 DIAGNOSIS — Z79899 Other long term (current) drug therapy: Secondary | ICD-10-CM

## 2019-11-24 DIAGNOSIS — E039 Hypothyroidism, unspecified: Secondary | ICD-10-CM | POA: Diagnosis present

## 2019-11-24 DIAGNOSIS — Z8711 Personal history of peptic ulcer disease: Secondary | ICD-10-CM

## 2019-11-24 DIAGNOSIS — I959 Hypotension, unspecified: Secondary | ICD-10-CM | POA: Diagnosis not present

## 2019-11-24 DIAGNOSIS — Z20822 Contact with and (suspected) exposure to covid-19: Secondary | ICD-10-CM | POA: Diagnosis present

## 2019-11-24 LAB — POCT I-STAT EG7
Acid-base deficit: 5 mmol/L — ABNORMAL HIGH (ref 0.0–2.0)
Bicarbonate: 18.7 mmol/L — ABNORMAL LOW (ref 20.0–28.0)
Calcium, Ion: 1.24 mmol/L (ref 1.15–1.40)
HCT: 49 % — ABNORMAL HIGH (ref 36.0–46.0)
Hemoglobin: 16.7 g/dL — ABNORMAL HIGH (ref 12.0–15.0)
O2 Saturation: 92 %
Potassium: 3.6 mmol/L (ref 3.5–5.1)
Sodium: 131 mmol/L — ABNORMAL LOW (ref 135–145)
TCO2: 20 mmol/L — ABNORMAL LOW (ref 22–32)
pCO2, Ven: 30.3 mmHg — ABNORMAL LOW (ref 44.0–60.0)
pH, Ven: 7.398 (ref 7.250–7.430)
pO2, Ven: 62 mmHg — ABNORMAL HIGH (ref 32.0–45.0)

## 2019-11-24 LAB — COMPREHENSIVE METABOLIC PANEL
ALT: 34 U/L (ref 0–44)
AST: 39 U/L (ref 15–41)
Albumin: 4.1 g/dL (ref 3.5–5.0)
Alkaline Phosphatase: 87 U/L (ref 38–126)
Anion gap: 19 — ABNORMAL HIGH (ref 5–15)
BUN: 20 mg/dL (ref 6–20)
CO2: 20 mmol/L — ABNORMAL LOW (ref 22–32)
Calcium: 9.5 mg/dL (ref 8.9–10.3)
Chloride: 91 mmol/L — ABNORMAL LOW (ref 98–111)
Creatinine, Ser: 1.09 mg/dL — ABNORMAL HIGH (ref 0.44–1.00)
GFR calc Af Amer: >60 mL/min (ref >60)
GFR calc non Af Amer: 57 mL/min — ABNORMAL LOW (ref >60)
Glucose, Bld: 328 mg/dL — ABNORMAL HIGH (ref 70–99)
Potassium: 3.3 mmol/L — ABNORMAL LOW (ref 3.5–5.1)
Sodium: 130 mmol/L — ABNORMAL LOW (ref 135–145)
Total Bilirubin: 1.3 mg/dL — ABNORMAL HIGH (ref 0.3–1.2)
Total Protein: 7 g/dL (ref 6.5–8.1)

## 2019-11-24 LAB — CBC
HCT: 51.9 % — ABNORMAL HIGH (ref 36.0–46.0)
Hemoglobin: 16.2 g/dL — ABNORMAL HIGH (ref 12.0–15.0)
MCH: 19.5 pg — ABNORMAL LOW (ref 26.0–34.0)
MCHC: 31.2 g/dL (ref 30.0–36.0)
MCV: 62.5 fL — ABNORMAL LOW (ref 80.0–100.0)
Platelets: 403 10*3/uL — ABNORMAL HIGH (ref 150–400)
RBC: 8.3 MIL/uL — ABNORMAL HIGH (ref 3.87–5.11)
RDW: 21.1 % — ABNORMAL HIGH (ref 11.5–15.5)
WBC: 18.1 10*3/uL — ABNORMAL HIGH (ref 4.0–10.5)
nRBC: 0 % (ref 0.0–0.2)

## 2019-11-24 LAB — BETA-HYDROXYBUTYRIC ACID: Beta-Hydroxybutyric Acid: 4.9 mmol/L — ABNORMAL HIGH (ref 0.05–0.27)

## 2019-11-24 LAB — BASIC METABOLIC PANEL
Anion gap: 15 (ref 5–15)
Anion gap: 17 — ABNORMAL HIGH (ref 5–15)
Anion gap: 18 — ABNORMAL HIGH (ref 5–15)
BUN: 17 mg/dL (ref 6–20)
BUN: 17 mg/dL (ref 6–20)
BUN: 15 mg/dL (ref 6–20)
CO2: 22 mmol/L (ref 22–32)
CO2: 19 mmol/L — ABNORMAL LOW (ref 22–32)
CO2: 17 mmol/L — ABNORMAL LOW (ref 22–32)
Calcium: 8.7 mg/dL — ABNORMAL LOW (ref 8.9–10.3)
Calcium: 9 mg/dL (ref 8.9–10.3)
Calcium: 8.5 mg/dL — ABNORMAL LOW (ref 8.9–10.3)
Chloride: 99 mmol/L (ref 98–111)
Chloride: 95 mmol/L — ABNORMAL LOW (ref 98–111)
Chloride: 97 mmol/L — ABNORMAL LOW (ref 98–111)
Creatinine, Ser: 0.73 mg/dL (ref 0.44–1.00)
Creatinine, Ser: 0.94 mg/dL (ref 0.44–1.00)
Creatinine, Ser: 0.89 mg/dL (ref 0.44–1.00)
GFR calc Af Amer: >60 mL/min (ref >60)
GFR calc Af Amer: >60 mL/min (ref >60)
GFR calc Af Amer: >60 mL/min (ref >60)
GFR calc non Af Amer: >60 mL/min (ref >60)
GFR calc non Af Amer: >60 mL/min (ref >60)
GFR calc non Af Amer: >60 mL/min (ref >60)
Glucose, Bld: 247 mg/dL — ABNORMAL HIGH (ref 70–99)
Glucose, Bld: 245 mg/dL — ABNORMAL HIGH (ref 70–99)
Glucose, Bld: 325 mg/dL — ABNORMAL HIGH (ref 70–99)
Potassium: 4.2 mmol/L (ref 3.5–5.1)
Potassium: 3.8 mmol/L (ref 3.5–5.1)
Potassium: 3.7 mmol/L (ref 3.5–5.1)
Sodium: 136 mmol/L (ref 135–145)
Sodium: 131 mmol/L — ABNORMAL LOW (ref 135–145)
Sodium: 132 mmol/L — ABNORMAL LOW (ref 135–145)

## 2019-11-24 LAB — MAGNESIUM
Magnesium: 1.7 mg/dL (ref 1.7–2.4)
Magnesium: 1.8 mg/dL (ref 1.7–2.4)

## 2019-11-24 LAB — GLUCOSE, CAPILLARY: Glucose-Capillary: 325 mg/dL — ABNORMAL HIGH (ref 70–99)

## 2019-11-24 LAB — RESPIRATORY PANEL BY RT PCR (FLU A&B, COVID)
Influenza A by PCR: NEGATIVE
Influenza B by PCR: NEGATIVE
SARS Coronavirus 2 by RT PCR: NEGATIVE

## 2019-11-24 LAB — CBG MONITORING, ED
Glucose-Capillary: 419 mg/dL — ABNORMAL HIGH (ref 70–99)
Glucose-Capillary: 387 mg/dL — ABNORMAL HIGH (ref 70–99)
Glucose-Capillary: 293 mg/dL — ABNORMAL HIGH (ref 70–99)
Glucose-Capillary: 230 mg/dL — ABNORMAL HIGH (ref 70–99)
Glucose-Capillary: 191 mg/dL — ABNORMAL HIGH (ref 70–99)
Glucose-Capillary: 165 mg/dL — ABNORMAL HIGH (ref 70–99)
Glucose-Capillary: 202 mg/dL — ABNORMAL HIGH (ref 70–99)

## 2019-11-24 LAB — HEMOGLOBIN A1C
Hgb A1c MFr Bld: 14.5 % — ABNORMAL HIGH (ref 4.8–5.6)
Mean Plasma Glucose: 369.45 mg/dL

## 2019-11-24 LAB — POC SARS CORONAVIRUS 2 AG -  ED: SARS Coronavirus 2 Ag: NEGATIVE

## 2019-11-24 LAB — LIPASE, BLOOD: Lipase: 24 U/L (ref 11–51)

## 2019-11-24 MED ORDER — DEXTROSE-NACL 5-0.45 % IV SOLN: INTRAVENOUS | Status: DC

## 2019-11-24 MED ORDER — DEXTROSE 50 % IV SOLN: 0.0000 mL | INTRAVENOUS | Status: DC | PRN

## 2019-11-24 MED ORDER — SODIUM CHLORIDE 0.9% FLUSH: 3.0000 mL | Freq: Once | INTRAVENOUS | Status: DC

## 2019-11-24 MED ORDER — TICAGRELOR 90 MG PO TABS
90.0000 mg | ORAL_TABLET | Freq: Two times a day (BID) | ORAL | Status: DC
  Administered 2019-11-24 – 2019-11-27 (×6): 90 mg via ORAL
  Filled 2019-11-24 (×6): qty 1

## 2019-11-24 MED ORDER — LURASIDONE HCL 20 MG PO TABS
60.0000 mg | ORAL_TABLET | Freq: Every day | ORAL | Status: DC
  Administered 2019-11-24 – 2019-11-26 (×3): 60 mg via ORAL
  Filled 2019-11-24: qty 3
  Filled 2019-11-24: qty 1
  Filled 2019-11-24 (×3): qty 3

## 2019-11-24 MED ORDER — ATORVASTATIN CALCIUM 80 MG PO TABS
80.0000 mg | ORAL_TABLET | Freq: Every day | ORAL | Status: DC
  Administered 2019-11-24 – 2019-11-26 (×3): 80 mg via ORAL
  Filled 2019-11-24 (×3): qty 1

## 2019-11-24 MED ORDER — LAMOTRIGINE 100 MG PO TABS
200.0000 mg | ORAL_TABLET | Freq: Every day | ORAL | Status: DC
  Administered 2019-11-25 – 2019-11-27 (×3): 200 mg via ORAL
  Filled 2019-11-24 (×3): qty 2

## 2019-11-24 MED ORDER — ACETAMINOPHEN 325 MG PO TABS
650.0000 mg | ORAL_TABLET | Freq: Four times a day (QID) | ORAL | Status: DC | PRN
  Administered 2019-11-26: 650 mg via ORAL
  Filled 2019-11-24: qty 2

## 2019-11-24 MED ORDER — INSULIN REGULAR(HUMAN) IN NACL 100-0.9 UT/100ML-% IV SOLN
INTRAVENOUS | Status: DC
  Administered 2019-11-24: 10:00:00 10.5 [IU]/h via INTRAVENOUS
  Filled 2019-11-24: qty 100

## 2019-11-24 MED ORDER — LORAZEPAM 2 MG/ML IJ SOLN
0.5000 mg | Freq: Once | INTRAMUSCULAR | Status: AC
  Administered 2019-11-24: 15:00:00 0.5 mg via INTRAVENOUS
  Filled 2019-11-24: qty 1

## 2019-11-24 MED ORDER — INSULIN ASPART 100 UNIT/ML ~~LOC~~ SOLN
0.0000 [IU] | Freq: Three times a day (TID) | SUBCUTANEOUS | Status: DC
  Administered 2019-11-25 (×2): 5 [IU] via SUBCUTANEOUS

## 2019-11-24 MED ORDER — SODIUM CHLORIDE 0.9 % IV BOLUS
1000.0000 mL | Freq: Once | INTRAVENOUS | Status: AC
  Administered 2019-11-24: 1000 mL via INTRAVENOUS

## 2019-11-24 MED ORDER — POTASSIUM CHLORIDE 10 MEQ/100ML IV SOLN
10.0000 meq | INTRAVENOUS | Status: AC
  Administered 2019-11-24 (×4): 10 meq via INTRAVENOUS
  Filled 2019-11-24 (×4): qty 100

## 2019-11-24 MED ORDER — TOPIRAMATE 25 MG PO TABS
25.0000 mg | ORAL_TABLET | Freq: Every day | ORAL | Status: DC
  Administered 2019-11-24: 25 mg via ORAL
  Filled 2019-11-24 (×2): qty 1

## 2019-11-24 MED ORDER — FENTANYL CITRATE (PF) 100 MCG/2ML IJ SOLN
50.0000 ug | Freq: Once | INTRAMUSCULAR | Status: AC
  Administered 2019-11-24: 10:00:00 50 ug via INTRAVENOUS
  Filled 2019-11-24: qty 2

## 2019-11-24 MED ORDER — INSULIN GLARGINE 100 UNIT/ML ~~LOC~~ SOLN
25.0000 [IU] | Freq: Every day | SUBCUTANEOUS | Status: DC
  Administered 2019-11-24: 25 [IU] via SUBCUTANEOUS
  Filled 2019-11-24 (×3): qty 0.25

## 2019-11-24 MED ORDER — FLUOXETINE HCL 20 MG PO CAPS
60.0000 mg | ORAL_CAPSULE | Freq: Every day | ORAL | Status: DC
  Administered 2019-11-25 – 2019-11-27 (×3): 60 mg via ORAL
  Filled 2019-11-24 (×4): qty 3

## 2019-11-24 MED ORDER — MOMETASONE FURO-FORMOTEROL FUM 200-5 MCG/ACT IN AERO
2.0000 | INHALATION_SPRAY | Freq: Two times a day (BID) | RESPIRATORY_TRACT | Status: DC
  Administered 2019-11-25 – 2019-11-27 (×5): 2 via RESPIRATORY_TRACT
  Filled 2019-11-24: qty 8.8

## 2019-11-24 MED ORDER — ALBUTEROL SULFATE (2.5 MG/3ML) 0.083% IN NEBU: 2.5000 mg | INHALATION_SOLUTION | Freq: Two times a day (BID) | RESPIRATORY_TRACT | Status: DC | PRN

## 2019-11-24 MED ORDER — INSULIN ASPART 100 UNIT/ML ~~LOC~~ SOLN
0.0000 [IU] | Freq: Every day | SUBCUTANEOUS | Status: DC
  Administered 2019-11-25: 2 [IU] via SUBCUTANEOUS

## 2019-11-24 MED ORDER — LOSARTAN POTASSIUM 50 MG PO TABS
100.0000 mg | ORAL_TABLET | Freq: Every day | ORAL | Status: DC
  Administered 2019-11-25: 100 mg via ORAL
  Filled 2019-11-24: qty 2

## 2019-11-24 MED ORDER — IOHEXOL 300 MG/ML  SOLN
100.0000 mL | Freq: Once | INTRAMUSCULAR | Status: AC | PRN
  Administered 2019-11-24: 13:00:00 100 mL via INTRAVENOUS

## 2019-11-24 MED ORDER — ONDANSETRON HCL 4 MG/2ML IJ SOLN
4.0000 mg | Freq: Once | INTRAMUSCULAR | Status: AC
  Administered 2019-11-24: 4 mg via INTRAVENOUS
  Filled 2019-11-24: qty 2

## 2019-11-24 MED ORDER — SODIUM CHLORIDE 0.9 % IV SOLN: INTRAVENOUS | Status: DC

## 2019-11-24 MED ORDER — FUROSEMIDE 20 MG PO TABS
20.0000 mg | ORAL_TABLET | Freq: Two times a day (BID) | ORAL | Status: DC
  Administered 2019-11-24 – 2019-11-25 (×2): 20 mg via ORAL
  Filled 2019-11-24 (×2): qty 1

## 2019-11-24 MED ORDER — DOXYCYCLINE HYCLATE 100 MG PO TABS
100.0000 mg | ORAL_TABLET | Freq: Once | ORAL | Status: AC
  Administered 2019-11-24: 15:00:00 100 mg via ORAL
  Filled 2019-11-24: qty 1

## 2019-11-24 MED ORDER — ACETAMINOPHEN 650 MG RE SUPP: 650.0000 mg | Freq: Four times a day (QID) | RECTAL | Status: DC | PRN

## 2019-11-24 MED ORDER — ASPIRIN EC 81 MG PO TBEC
81.0000 mg | DELAYED_RELEASE_TABLET | Freq: Every day | ORAL | Status: DC
  Administered 2019-11-25 – 2019-11-27 (×3): 81 mg via ORAL
  Filled 2019-11-24 (×3): qty 1

## 2019-11-24 MED ORDER — LEVOTHYROXINE SODIUM 75 MCG PO TABS
150.0000 ug | ORAL_TABLET | Freq: Every day | ORAL | Status: DC
  Administered 2019-11-24 – 2019-11-26 (×3): 150 ug via ORAL
  Filled 2019-11-24 (×3): qty 2

## 2019-11-24 MED ORDER — PANTOPRAZOLE SODIUM 40 MG PO TBEC
40.0000 mg | DELAYED_RELEASE_TABLET | Freq: Two times a day (BID) | ORAL | Status: DC
  Administered 2019-11-24 – 2019-11-27 (×6): 40 mg via ORAL
  Filled 2019-11-24 (×6): qty 1

## 2019-11-24 MED ORDER — METOPROLOL TARTRATE 50 MG PO TABS
50.0000 mg | ORAL_TABLET | Freq: Two times a day (BID) | ORAL | Status: DC
  Administered 2019-11-24 – 2019-11-25 (×2): 50 mg via ORAL
  Filled 2019-11-24 (×2): qty 1

## 2019-11-24 MED ORDER — ONDANSETRON HCL 4 MG/2ML IJ SOLN: 4.0000 mg | Freq: Once | INTRAMUSCULAR | Status: DC

## 2019-11-24 NOTE — ED Notes (Signed)
Pt transported to CT ?

## 2019-11-24 NOTE — H&P (Signed)
[facility] Admission History and Physical Service Pager: [phone]  Patient name: Morgan [name] Medical record number: [id number] Date of birth: Oct 29, 1965 Age: 54 y.o. Gender: female  Primary Care Provider: Shirley, [place], DO Consultants: None Code Status: Limited to intubation Preferred Emergency Contact: Kylyn Mcdade (son) [phone]  Chief Complaint: dry heaves  Assessment and Plan: Morgan [name] is a 54 y.o. female presenting with nausea and dry heaves. PMH is significant for CAD, DM type II, hypertension, hyperlipidemia, hypothyroidism, bipolar/depression/anxiety, Cholecystectomy.  Hyperglycemia DM Type II Patient reports has not been taking her insulin for about 2 weeks secondary to change in home routine.  Labs significant for serum glucose 328, anion gap 19-->15-->17, BHBT 4.90, potassium 3.3.  WBC 18.1.  Hemoglobin 16.2 and hematocrit 51.9.  Received 1 L normal saline bolus in ED and was started on insulin drip.  Given difficulty trying to obtain labs secondary to poor peripheral access insulin drip was discontinued and midline ordered.  On exam mucous membranes slightly dry.  Likely hyperglycemia/DKA secondary to noncompliance with insulin regime.  Will admit for observation, insulin therapy and rehydration. -Admit to MedSurg, Attending Dr. Nori Riis -Discontinue IV insulin patient unable to obtain frequent labs due to poor peripheral veins. -Start Lantus 25 units daily -Sensitive sliding scale insulin coverage -CBG monitoring -BMP every 4 -Continuous cardiac monitoring -PT/OT/diabetic educator -Repeat normal saline bolus 1L -Midline placement for lab draws  Leukocytosis WBC 18.1 on admission.  Patient afebrile.  Reports chronic cough.  Chest x-ray shows left lower lobe airspace disease, may represent developing pneumonia or atelectasis.  CT chest impressive for left lower lobe consolidative opacities with associated volume loss.  Potentially  atelectasis, infection cannot be excluded.  Two-view chest x-ray shows moderate left basilar atelectasis, consolidation.  She received 1 dose doxycycline 100 mg in ED.  Leukocytosis likely secondary to dehydration and physiological stress.  RPP negative, Covid negative.  Given the patient is afebrile with nonproductive cough and no recent sick contact will hold off on continuing antibiotics.  -CBC in a.m. -Monitor fever curve and respiratory status  Hypokalemia Potassium 3.3.  Received 40 mEq potassium IV total in ED.  ECG shows sinus tachycardia.  Insulin drip discontinued unable to obtain labs due to poor venous access. -Continue to monitor -BMP in a.m.  GERD  Chronic esophagogastric ulcer  Nausea Chronic.  Stable.  Home medications include for Protonix 49 mg twice daily.  Given the patient reports has not been compliant with medications x2 weeks could also be contributing factor to nausea and dry heaves although reports abdominal pain only when having dry heaves.  CT abdomen shows no explanation for abdominal pain.  Also noted hepatic steatosis.  Benign abdominal exam.  Bowel sounds present. -CMP in a.m. -Continue to monitor for abdominal pain -Protonix 40 mg twice daily  HTN Chronic.  Stable.  Blood pressure 129/77.  Home medications include Lasix 20 mg IV, metoprolol 50 mg twice daily, Cozaar 100 mg daily -Continue to monitor blood pressure -Continue home medication  HLD Chronic.  Stable.  Last lipid profile 06/2019.  Cholesterol 130, HDL 31, LDL 50, triglycerides 244.  Home medications atorvastatin 80 mg daily. -Continue home medication -Consider repeat Lipid Profile  Hypothyroidism Chronic.  Stable.  Last TSH 2.978 (12/19).  Home medications include Synthroid 150 mg daily. -Continue home medication  Bipolar  Depression Anxiety Chronic.  Stable.  Flat affect.  Home medications include fluoxetine 60 mg daily, Latuda 60 mg daily, Lamictal 200 mg daily. -Continue home  medication  Asthma Chronic.  Stable.  She reports using Ventolin inhaler 2 puffs daily as needed and Advair 2 puffs twice daily.  On exam no wheezes appreciated.  Currently on room air satting at 93-94%.  Respiration rate 17 and no increased work of breathing. -Continue albuterol as needed -Monitor respiratory status.  FEN/GI:  -Heart healthy/carb modified -IV normal saline at 146m/hr -Midline for blood draw  Prophylaxis:  -Lovenox  Disposition: MedSurg, attending Dr. NNori Riis History of Present Illness:  Morgan [name] a 54y.o. female presenting with nausea and hyperglycemia  Patient reports nausea with dry heaves x3 to 4 days.  Denies any hematemesis.  She reports not having taken her insulin for other medications for about 2 weeks she has had recent change in her routine.  She reports that because of this she forgets to take her medications.  Home medications include insulin 42 units twice daily and NovoLog 25 units with meals.  She reports feeling more fatigued and her blood sugars were elevated recently.  She also reports polyuria and polydipsia.  Denies any fevers, chills, chest pain or shortness of breath.  She reports having cough that is chronic and feels like there is a lot of mucus.  She also endorses some exertional dyspnea that is chronic in nature.  Review Of Systems: Per HPI with the following additions:   Review of Systems  Constitutional: Negative for chills and fever.  Respiratory: Positive for cough. Negative for chest tightness, shortness of breath and wheezing.   Cardiovascular: Negative for chest pain, palpitations and leg swelling.  Gastrointestinal: Positive for nausea. Negative for abdominal pain, blood in stool, constipation, diarrhea and vomiting.  Genitourinary: Negative for decreased urine volume, difficulty urinating and frequency.  Neurological: Negative for dizziness, syncope, speech difficulty, light-headedness and headaches.     Patient Active  Problem List   Diagnosis Date Noted  . Dyspnea on exertion 10/25/2019  . Cervical spinal stenosis 10/09/2019  . Carpal tunnel syndrome, bilateral 09/07/2019  . Status post carotid endarterectomy 07/30/2019  . Subclavian artery stenosis, left ([facility] 07/30/2019  . 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 Migraines 07/16/2019  . Stenosis of left subclavian artery ([place] 06/05/2019  . Vitamin D deficiency 05/19/2019  . Chronic nonintractable headache   . Type 2 diabetes mellitus with hyperglycemia, with long-term current use of insulin ([facility] 11/17/2018  . S/P coronary artery stent placement   . Acute systolic heart failure ([facility]   . Hyperlipidemia LDL goal <70   . Esophagitis determined by endoscopy   . Hepatic steatosis   . Elevated transaminase level 07/21/2018  . Mild renal insufficiency 07/21/2018  . Bipolar depression ([place] 07/21/2018  . Incidental lung nodule 07/21/2018  . Extravasation accident, initial encounter 07/21/2018  . Prolonged QT interval 07/21/2018  . Hyperlipidemia 05/14/2016  . Hypothyroidism 05/14/2016  . Essential hypertension 05/14/2016    Past Medical History: Past Medical History:  Diagnosis Date  . Anxiety   . Asthma   . Bipolar depression ([facility] [place]   . Chronic bronchitis ([facility]   . Chronic esophagogastric ulcer   . Chronic stomach ulcer   . Fibroid   . GERD (gastroesophageal reflux disease)   . Heart murmur   . Hepatic steatosis   . History of blood transfusion 2010   "related to subclavian stent"  . History of hiatal hernia   . Hyperlipidemia   . Hypertension   . Hypothyroid   . LFTs abnormal   . Migraine    "cerebral migraines; 1-2/month" (  06/29/2018)  . Non-ST elevation (NSTEMI) myocardial infarction ([place]) 10/27/2018  . Occlusion of brachial artery ([place]) 2010  . 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 Stroke [facility])    "I've had 2; most recent one was ~ 2015 or before; affected balance" (06/29/2018)  . Thalassemia minor   . Type II diabetes  mellitus ([place])   . Vitamin D deficiency     Past Surgical History: Past Surgical History:  Procedure Laterality Date  . ABDOMINAL HYSTERECTOMY  2015   "still have 1 ovary"  . AORTIC ARCH ANGIOGRAPHY  06/14/2019  . AORTIC ARCH ANGIOGRAPHY N/A 06/14/2019   Procedure: AORTIC ARCH ANGIOGRAPHY;  Surgeon: Lorretta Harp, MD;  Location: [place] CV LAB;  Service: Cardiovascular;  Laterality: N/A;  . BIOPSY  07/25/2018   Procedure: BIOPSY;  Surgeon: Thornton Park, MD;  Location: [facility] ENDOSCOPY;  Service: Gastroenterology;;  . CAROTID-SUBCLAVIAN BYPASS GRAFT Left 07/30/2019   Procedure: LEFT CAROTID-SUBCLAVIAN BYPASS GRAFT;  Surgeon: Marty Heck, MD;  Location: [place];  Service: Vascular;  Laterality: Left;  . CESAREAN SECTION  1988; 1996  . CORONARY STENT INTERVENTION N/A 10/27/2018   Procedure: CORONARY STENT INTERVENTION;  Surgeon: Sherren Mocha, MD;  Location: [place] CV LAB;  Service: Cardiovascular;  Laterality: N/A;  . ESOPHAGOGASTRODUODENOSCOPY (EGD) WITH PROPOFOL N/A 07/25/2018   Procedure: ESOPHAGOGASTRODUODENOSCOPY (EGD) WITH PROPOFOL;  Surgeon: Thornton Park, MD;  Location: [place];  Service: Gastroenterology;  Laterality: N/A;  . KNEE RECONSTRUCTION Left 1980  . LAPAROSCOPIC CHOLECYSTECTOMY    . LEFT HEART CATH AND CORONARY ANGIOGRAPHY N/A 10/27/2018   Procedure: LEFT HEART CATH AND CORONARY ANGIOGRAPHY;  Surgeon: Sherren Mocha, MD;  Location: [place] CV LAB;  Service: Cardiovascular;  Laterality: N/A;  . SUBCLAVIAN STENT PLACEMENT Left 2010  . TONSILLECTOMY  1970  . UPPER EXTREMITY ANGIOGRAPHY Right 06/14/2019   Procedure: UPPER EXTREMITY ANGIOGRAPHY;  Surgeon: Lorretta Harp, MD;  Location: [place] [facility] LAB;  Service: Cardiovascular;  Laterality: Right;  upper ext    Social History: Social History   Tobacco Use  . Smoking status: Former Smoker    Packs/day: 2.00    Years: 36.00    Pack years: 72.00    Types: Cigarettes    Quit  date: 05/14/2014    Years since quitting: 5.5  . Smokeless tobacco: Never Used  Substance Use Topics  . Alcohol use: Not Currently  . Drug use: Not Currently   Additional social history: Lives with son  Please also refer to relevant sections of EMR.  Family History: Family History  Problem Relation Age of Onset  . Diabetes Mother   . Hypertension Mother   . Hyperlipidemia Mother   . CAD Mother   . Cervical cancer Mother   . CAD Father   . Heart failure Father   . Testicular cancer Father   . Hypertension Sister   . Hyperlipidemia Sister   . Hypertension Brother   . Hyperlipidemia Brother      Allergies and Medications: Allergies  Allergen Reactions  . Ilosone [Erythromycin] Anaphylaxis  . Keflex [Cephalexin] Anaphylaxis  . Penicillins Anaphylaxis    Has patient had a PCN reaction causing immediate rash, facial/tongue/throat swelling, SOB or lightheadedness with hypotension: YES Has patient had a PCN reaction causing severe rash involving mucus membranes or skin necrosis: NO Has patient had a PCN reaction that required hospitalizationNO Has patient had a PCN reaction occurring within the last 10 years: NO If all of the above answers are "NO", then  may proceed with Cephalosporin use.  . Iron Other (See Comments)    Pt reports condition limiting Iron intake.  . Metformin And Related Other (See Comments)    UNSPECIFIED REACTION  Pt prefers to not take this medication Side effects  . Morphine And Related Other (See Comments)    Pt reports hallucinations   No current facility-administered medications on file prior to encounter.   Current Outpatient Medications on File Prior to Encounter  Medication Sig Dispense Refill  . aspirin 81 MG tablet Take 1 tablet (81 mg total) by mouth daily. 30 tablet 1  . atorvastatin (LIPITOR) 80 MG tablet Take 1 tablet (80 mg total) by mouth at bedtime. 30 tablet 11  . Cholecalciferol (VITAMIN D3) 25 MCG (1000 UT) CAPS Take 1,000-2,000  Units by mouth See admin instructions. Take 2 tablets by mouth once a day on Mon/Wed/Fri and take 1 tablet on Sun/Tues/Thurs/Sat    . diclofenac Sodium (VOLTAREN) 1 % GEL Apply 4 g topically 4 (four) times daily. 150 g 0  . EPINEPHrine 0.3 mg/0.3 mL IJ SOAJ injection Inject 0.3 mg into the muscle once as needed for anaphylaxis (for an anaphylactic reaction).   1  . FLUoxetine (PROZAC) 20 MG tablet Take 60 mg by mouth daily.     . fluticasone-salmeterol (ADVAIR HFA) 115-21 MCG/ACT inhaler Inhale 2 puffs into the lungs 2 (two) times daily. 1 Inhaler 1  . furosemide (LASIX) 20 MG tablet TAKE 1 TABLET BY MOUTH  TWICE DAILY 180 tablet 3  . GLUCAGON EMERGENCY 1 MG injection Inject 1 mg into the vein once as needed (FOR ONSET OF HYPOGLYCEMIA).   2  . Insulin Glargine (LANTUS SOLOSTAR) 100 UNIT/ML Solostar Pen Inject 42 Units into the skin 2 (two) times daily. 30 mL 11  . insulin lispro (HUMALOG KWIKPEN) 100 UNIT/ML KwikPen Inject 0.25 mLs (25 Units total) into the skin 3 (three) times daily. (Patient taking differently: Inject 25 Units into the skin 3 (three) times daily. 200U/ml) 15 mL 11  . lamoTRIgine (LAMICTAL) 200 MG tablet Take 200 mg by mouth daily.     [name] levothyroxine (SYNTHROID, LEVOTHROID) 150 MCG tablet Take 150 mcg by mouth at bedtime. 3:30 Am    . losartan (COZAAR) 100 MG tablet Take 1 tablet (100 mg total) by mouth daily. 90 tablet 3  . Lurasidone HCl (LATUDA) 60 MG TABS Take 60 mg by mouth at bedtime.     . metoprolol tartrate (LOPRESSOR) 50 MG tablet Take 1 tablet (50 mg total) by mouth 2 (two) times daily. 180 tablet 3  . Multiple Vitamin (MULTIVITAMIN WITH MINERALS) TABS tablet Take 1 tablet by mouth daily. Woman 50 +    . nitroGLYCERIN (NITROSTAT) 0.4 MG SL tablet Place 1 tablet (0.4 mg total) under the tongue every 5 (five) minutes x 3 doses as needed for chest pain. 30 tablet 12  . nystatin (MYCOSTATIN/NYSTOP) powder Apply topically as needed. (Patient taking differently: Apply 1 g  topically daily as needed (infection). ) 15 g 0  . nystatin ointment (MYCOSTATIN) Apply 1 application topically daily as needed (infection).    . pantoprazole (PROTONIX) 40 MG tablet Take 1 tablet (40 mg total) by mouth 2 (two) times daily before a meal. X 8 weeks (Patient taking differently: Take 40 mg by mouth 2 (two) times daily. ) 60 tablet 3  . potassium chloride (KLOR-CON) 10 MEQ tablet Take 10 mEq by mouth 2 (two) times daily.    . SUMAtriptan (IMITREX) 25 MG tablet Take 1  tablet (25 mg total) by mouth every 2 (two) hours as needed for migraine. May repeat in 2 hours if headache persists or recurs. Please take as soon as you feel your migraine starting. 10 tablet 0  . ticagrelor (BRILINTA) 90 MG TABS tablet Take 1 tablet (90 mg total) by mouth 2 (two) times daily. 180 tablet 3  . topiramate (TOPAMAX) 25 MG tablet Take 1 tablet (25 mg total) by mouth at bedtime. 30 tablet 1  . topiramate (TOPAMAX) 50 MG tablet Take 1 tablet (50 mg total) by mouth at bedtime. 30 tablet 1  . traMADol (ULTRAM) 50 MG tablet Take 50 mg by mouth every 6 (six) hours as needed for moderate pain (Migraine).    . VENTOLIN HFA 108 (90 Base) MCG/ACT inhaler INHALE 2 INHALATIONS BY  MOUTH INTO THE LUNGS TWICE  DAILY AS NEEDED FOR  WHEEZING OR SHORTNESS OF  BREATH (Patient taking differently: Inhale 2 puffs into the lungs 2 (two) times daily as needed for wheezing or shortness of breath. ) 36 g 2  . Accu-Chek FastClix Lancets MISC 1 Package by Does not apply route 3 (three) times daily. 150 each 3  . Blood Glucose Monitoring Suppl (CONTOUR NEXT ONE) KIT 1 kit by Does not apply route as directed. 1 kit 0  . glucose blood (CONTOUR NEXT TEST) test strip Use as instructed 400 each 12  . Insulin Pen Needle (PEN NEEDLES) 32G X 4 MM MISC 1 Device by Does not apply route 5 (five) times daily. 150 each 11    Objective: BP (!) 151/87   Pulse (!) 104   Temp 98.8 F (37.1 C) (Oral)   Resp (!) 23   LMP 08/03/2013   SpO2 91%   Exam:  General: Alert and oriented, no apparent distress  ENTM: No pharyengeal erythema, mucous membranes slightly dry. Cardiovascular: RRR with no murmurs noted Respiratory: CTA bilaterally  Gastrointestinal: Bowel sounds present. No abdominal pain MSK: Upper extremity strength 5/5 bilaterally, Lower extremity strength 5/5 bilaterally  Derm: No rashes noted Psych: Flat affect, answers questions appropriately.  Labs and Imaging: CBC BMET  Recent Labs  Lab 11/24/19 0026 11/24/19 0026 11/24/19 1023  WBC 18.1*  --   --   HGB 16.2*   < > 16.7*  HCT 51.9*   < > 49.0*  PLT 403*  --   --    < > = values in this interval not displayed.   Recent Labs  Lab 11/24/19 0026 11/24/19 0026 11/24/19 1023  NA 130*   < > 131*  K 3.3*   < > 3.6  CL 91*  --   --   CO2 20*  --   --   BUN 20  --   --   CREATININE 1.09*  --   --   GLUCOSE 328*  --   --   CALCIUM 9.5  --   --    < > = values in this interval not displayed.     EKG: Sinus tachycardia Consider right atrial enlargement No STEMI Confirmed by Octaviano Glow [phone]) on 11/24/2019 9:13:30 AM 64m/s 145mmV '150Hz'  9.0.4 CID: [id number] By: MaOctaviano GlowDG Chest 2 View  Result Date: 11/24/2019 CLINICAL DATA:  Nausea and vomiting with shortness of breath. EXAM: CHEST - 2 VIEW COMPARISON:  11/24/2019 radiograph, CT and prior studies FINDINGS: Moderate LEFT basilar atelectasis/consolidation again noted. The cardiomediastinal silhouette is unchanged. No large pleural effusion or pneumothorax. No acute bony abnormalities are identified. IMPRESSION: Moderate  LEFT basilar atelectasis/consolidation again noted. Electronically Signed   By: Margarette Canada M.D.   On: 11/24/2019 19:01   CT Chest W Contrast  Result Date: 11/24/2019 CLINICAL DATA:  Unspecified abdominal pain. Persistent cough. Nausea and vomiting for the past 3 days. EXAM: CT CHEST, ABDOMEN, AND PELVIS WITH CONTRAST TECHNIQUE: Multidetector CT imaging of the chest, abdomen  and pelvis was performed following the standard protocol during bolus administration of intravenous contrast. CONTRAST:  141m OMNIPAQUE IOHEXOL 300 MG/ML  SOLN COMPARISON:  Chest CT-04/07/2019; 11/13/2007; CT the chest, abdomen and pelvis-08/09/2018 FINDINGS: CT CHEST FINDINGS Cardiovascular: Normal heart size. No pericardial effusion. Coronary calcifications/coronary stent placement. No pericardial effusion. Although this examination was not tailored for the evaluation the pulmonary arteries, there are no discrete filling defects within the central pulmonary arterial tree to suggest central pulmonary embolism. Normal caliber of the main pulmonary artery. Normal caliber of the thoracic aorta. Patient has undergone left common carotid to subclavian bypass grafting which appears patent on this non CTA examination. There is persistent occlusion of the left subclavian artery despite previous stent placement. The remaining branch vessels of the aortic arch appear widely patent throughout their imaged courses. There is a minimal amount of mixed calcified and noncalcified atherosclerotic plaque involving the descending thoracic aorta, not resulting in hemodynamically significant stenosis. Mediastinum/Nodes: No bulky mediastinal, hilar or axillary lymphadenopathy. Lungs/Pleura: Interval development of left lower lobe consolidative opacities with associated volume loss and air bronchograms. Minimal right basilar subpleural heterogeneous opacities favored to represent atelectasis or scar. Punctate (approximately 7 mm) suspected perifissural lymph node within the peripheral aspect of the right major fissure (image 69, series 5), is unchanged compared to the 2009 examination. As is centrally calcified granuloma within the anterior aspect of the left upper lobe (image 62, series 5). No new pulmonary nodules given limitation of the examination. No pleural effusion or pneumothorax. The central pulmonary airways appear patent.  Musculoskeletal: No acute or aggressive osseous abnormalities. 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 _________________________________________________________ CT ABDOMEN PELVIS FINDINGS Hepatobiliary: Mild nodularity hepatic contour. This finding is associated with mild diffuse decreased attenuation hepatic parenchyma on this postcontrast examination suggestive hepatic steatosis. No discrete hepatic lesions. Post cholecystectomy. No intra or extrahepatic biliary ductal dilatation. No ascites. Pancreas: Normal appearance of the pancreas. Spleen: Normal appearance of the spleen. Adrenals/Urinary Tract: There is symmetric enhancement and excretion of the bilateral kidneys. Mild geographic atrophy involving the inferior pole of the right kidney. No definite evidence of nephrolithiasis on this postcontrast examination. No discrete renal lesions. No urine obstruction or perinephric stranding. There is mild thickening of the left adrenal gland without discrete nodule. Normal appearance of the right adrenal gland. Normal appearance of the urinary bladder given degree distention. Stomach/Bowel: The bowel is normal in course and caliber without evidence of enteric obstruction. Normal appearance of the terminal ileum and appendix. No discrete areas of bowel wall thickening. No pneumoperitoneum, pneumatosis or portal venous gas. Vascular/Lymphatic: Moderate amount of mixed calcified and noncalcified atherosclerotic plaque throughout the abdominal aorta, not resulting in a hemodynamically significant stenosis. The left gastric artery is incidentally noted to arise directly from the abdominal aorta. No bulky retroperitoneal, mesenteric, pelvic or inguinal lymphadenopathy. Reproductive: Post hysterectomy.  No discrete adnexal lesion. Other: Well-healed midline lower abdominal ventral wall incision. Musculoskeletal: No acute or aggressive osseous  abnormalities. Mild degenerative change of the right hip with joint space loss, subchondral sclerosis and osteophytosis. IMPRESSION: Chest CT impression: 1. Left lower lobe consolidative opacities  with associated volume loss and air bronchograms, potentially atelectasis though underlying infection not excluded. 2. 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 3. Coronary calcifications.  Aortic Atherosclerosis (ICD10-I70.0). Abdomen and pelvic CT impression: 1. No explanation for patient's abdominal pain. Specifically, no evidence of enteric or urinary obstruction. Normal appearance of the appendix. 2. Suspected hepatic steatosis with mild nodularity hepatic contour as could be seen the setting of early cirrhotic change. Correlation with LFTs is advised. 3.  Aortic Atherosclerosis (ICD10-I70.0). Electronically Signed   By: Sandi Mariscal M.D.   On: 11/24/2019 13:42   CT Abdomen Pelvis W Contrast  Result Date: 11/24/2019 CLINICAL DATA:  Unspecified abdominal pain. Persistent cough. Nausea and vomiting for the past 3 days. EXAM: CT CHEST, ABDOMEN, AND PELVIS WITH CONTRAST TECHNIQUE: Multidetector CT imaging of the chest, abdomen and pelvis was performed following the standard protocol during bolus administration of intravenous contrast. CONTRAST:  142m OMNIPAQUE IOHEXOL 300 MG/ML  SOLN COMPARISON:  Chest CT-04/07/2019; 11/13/2007; CT the chest, abdomen and pelvis-08/09/2018 FINDINGS: CT CHEST FINDINGS Cardiovascular: Normal heart size. No pericardial effusion. Coronary calcifications/coronary stent placement. No pericardial effusion. Although this examination was not tailored for the evaluation the pulmonary arteries, there are no discrete filling defects within the central pulmonary arterial tree to suggest central pulmonary embolism. Normal caliber of the main pulmonary artery. Normal caliber of the thoracic aorta. Patient has undergone left common carotid  to subclavian bypass grafting which appears patent on this non CTA examination. There is persistent occlusion of the left subclavian artery despite previous stent placement. The remaining branch vessels of the aortic arch appear widely patent throughout their imaged courses. There is a minimal amount of mixed calcified and noncalcified atherosclerotic plaque involving the descending thoracic aorta, not resulting in hemodynamically significant stenosis. Mediastinum/Nodes: No bulky mediastinal, hilar or axillary lymphadenopathy. Lungs/Pleura: Interval development of left lower lobe consolidative opacities with associated volume loss and air bronchograms. Minimal right basilar subpleural heterogeneous opacities favored to represent atelectasis or scar. Punctate (approximately 7 mm) suspected perifissural lymph node within the peripheral aspect of the right major fissure (image 69, series 5), is unchanged compared to the 2009 examination. As is centrally calcified granuloma within the anterior aspect of the left upper lobe (image 62, series 5). No new pulmonary nodules given limitation of the examination. No pleural effusion or pneumothorax. The central pulmonary airways appear patent. Musculoskeletal: No acute or aggressive osseous abnormalities. 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 _________________________________________________________ CT ABDOMEN PELVIS FINDINGS Hepatobiliary: Mild nodularity hepatic contour. This finding is associated with mild diffuse decreased attenuation hepatic parenchyma on this postcontrast examination suggestive hepatic steatosis. No discrete hepatic lesions. Post cholecystectomy. No intra or extrahepatic biliary ductal dilatation. No ascites. Pancreas: Normal appearance of the pancreas. Spleen: Normal appearance of the spleen. Adrenals/Urinary Tract: There is symmetric enhancement and excretion of the bilateral  kidneys. Mild geographic atrophy involving the inferior pole of the right kidney. No definite evidence of nephrolithiasis on this postcontrast examination. No discrete renal lesions. No urine obstruction or perinephric stranding. There is mild thickening of the left adrenal gland without discrete nodule. Normal appearance of the right adrenal gland. Normal appearance of the urinary bladder given degree distention. Stomach/Bowel: The bowel is normal in course and caliber without evidence of enteric obstruction. Normal appearance of the terminal ileum and appendix. No discrete areas of bowel wall thickening. No pneumoperitoneum, pneumatosis or portal venous gas. Vascular/Lymphatic: Moderate amount of  mixed calcified and noncalcified atherosclerotic plaque throughout the abdominal aorta, not resulting in a hemodynamically significant stenosis. The left gastric artery is incidentally noted to arise directly from the abdominal aorta. No bulky retroperitoneal, mesenteric, pelvic or inguinal lymphadenopathy. Reproductive: Post hysterectomy.  No discrete adnexal lesion. Other: Well-healed midline lower abdominal ventral wall incision. Musculoskeletal: No acute or aggressive osseous abnormalities. Mild degenerative change of the right hip with joint space loss, subchondral sclerosis and osteophytosis. IMPRESSION: Chest CT impression: 1. Left lower lobe consolidative opacities with associated volume loss and air bronchograms, potentially atelectasis though underlying infection not excluded. 2. 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 3. Coronary calcifications.  Aortic Atherosclerosis (ICD10-I70.0). Abdomen and pelvic CT impression: 1. No explanation for patient's abdominal pain. Specifically, no evidence of enteric or urinary obstruction. Normal appearance of the appendix. 2. Suspected hepatic steatosis with mild nodularity hepatic contour as could be seen the  setting of early cirrhotic change. Correlation with LFTs is advised. 3.  Aortic Atherosclerosis (ICD10-I70.0). Electronically Signed   By: Sandi Mariscal M.D.   On: 11/24/2019 13:42   DG Chest Port 1 View  Result Date: 11/24/2019 CLINICAL DATA:  Nausea vomiting for 3 days of increased weakness and dizziness EXAM: PORTABLE CHEST 1 VIEW COMPARISON:  05/12/2019 FINDINGS: Interval postoperative change in the LEFT supraclavicular region. Elevation of the LEFT hemidiaphragm since the previous study associated with LEFT lower lobe airspace disease. Cardiomediastinal contours partially obscured by elevated LEFT hemidiaphragm. Otherwise stable. Signs of LEFT subclavian stenting. RIGHT lung is clear. No acute bone process. IMPRESSION: 1. LEFT lower lobe airspace disease with elevation of the LEFT hemidiaphragm, new from the prior study. Findings could be related to diaphragmatic paralysis or dysfunction. 2. LEFT lower lobe airspace disease may represent developing pneumonia or juxta diaphragmatic atelectasis. 3. 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 Electronically Signed   By: Zetta Bills M.D.   On: 11/24/2019 10:21    Carollee Leitz, MD 11/24/2019, 3:34 PM PGY-1, [place] Intern pager: [phone], text pages welcome

## 2019-11-24 NOTE — ED Provider Notes (Signed)
[place] EMERGENCY DEPARTMENT Provider Note   CSN: [id number] Arrival date & time: 11/24/19  0005     History Chief Complaint  Patient presents with  . Emesis    Morgan [name] is a 54 y.o. female with a history of insulin dependent T2DM, CAD, GERD, elevated LFTs,  hypertension, hyperlipidemia, hypothyroidism, prior stroke, bipolar disorder, thalassemia minor, and prior abdominal surgeries including abdominal hysterectomy, C-section, and cholecystectomy who presents to the emergency department with complaints of N/V for the past 3-4 days. Patient states she has vomited too numerous times to count, she is dry heaving, unable to keep anything down. With emesis she has associated intermittent generalized abdominal pain. She relays she feels weak, dehydrated, fatigued, and when she tries to get up and move around she feels like she might pass out. She relays that her blood sugars have been elevated > 400 which is very abnormal for her, she has had polydipsia, she has missed some doses of her insulin, has not had any since ER arrival today. Also reports some chills as well. Denies fever, diarrhea, constipation, melena, dysuria, urgency, or chest pain. Relays she has her chronic cough and dyspnea with movement, but no acute change in these sxs.   HPI     Past Medical History:  Diagnosis Date  . Anxiety   . Asthma   . Bipolar depression ([place])   . Chronic bronchitis ([place])   . Chronic esophagogastric ulcer   . Chronic stomach ulcer   . Fibroid   . GERD (gastroesophageal reflux disease)   . Heart murmur   . Hepatic steatosis   . History of blood transfusion 2010   "related to subclavian stent"  . History of hiatal hernia   . Hyperlipidemia   . Hypertension   . Hypothyroid   . LFTs abnormal   . Migraine    "cerebral migraines; 1-2/month" (06/29/2018)  . Non-ST elevation (NSTEMI) myocardial infarction ([place]) 10/27/2018  . Occlusion of brachial artery ([place]) 2010  .  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 Stroke [facility])    "I've had 2; most recent one was ~ 2015 or before; affected balance" (06/29/2018)  . Thalassemia minor   . Type II diabetes mellitus ([place])   . Vitamin D deficiency     Patient Active Problem List   Diagnosis Date Noted  . Dyspnea on exertion 10/25/2019  . Cervical spinal stenosis 10/09/2019  . Carpal tunnel syndrome, bilateral 09/07/2019  . Status post carotid endarterectomy 07/30/2019  . Subclavian artery stenosis, left ([place]) 07/30/2019  . 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 Migraines 07/16/2019  . Stenosis of left subclavian artery ([place]) 06/05/2019  . Vitamin D deficiency 05/19/2019  . Chronic nonintractable headache   . Type 2 diabetes mellitus with hyperglycemia, with long-term current use of insulin ([place]) 11/17/2018  . S/P coronary artery stent placement   . Acute systolic heart failure ([place])   . Hyperlipidemia LDL goal <70   . Esophagitis determined by endoscopy   . Hepatic steatosis   . Elevated transaminase level 07/21/2018  . Mild renal insufficiency 07/21/2018  . Bipolar depression ([place]) 07/21/2018  . Incidental lung nodule 07/21/2018  . Extravasation accident, initial encounter 07/21/2018  . Prolonged QT interval 07/21/2018  . Hyperlipidemia 05/14/2016  . Hypothyroidism 05/14/2016  . Essential hypertension 05/14/2016    Past Surgical History:  Procedure Laterality Date  . ABDOMINAL HYSTERECTOMY  2015   "still have 1 ovary"  . AORTIC ARCH ANGIOGRAPHY  06/14/2019  .  AORTIC ARCH ANGIOGRAPHY N/A 06/14/2019   Procedure: AORTIC ARCH ANGIOGRAPHY;  Surgeon: Lorretta Harp, MD;  Location: [place] CV LAB;  Service: Cardiovascular;  Laterality: N/A;  . BIOPSY  07/25/2018   Procedure: BIOPSY;  Surgeon: Thornton Park, MD;  Location: [facility] ENDOSCOPY;  Service: Gastroenterology;;  . CAROTID-SUBCLAVIAN BYPASS GRAFT Left 07/30/2019   Procedure: LEFT CAROTID-SUBCLAVIAN BYPASS GRAFT;  Surgeon: Marty Heck, MD;  Location: [place];  Service: Vascular;  Laterality: Left;  . CESAREAN SECTION  1988; 1996  . CORONARY STENT INTERVENTION N/A 10/27/2018   Procedure: CORONARY STENT INTERVENTION;  Surgeon: Sherren Mocha, MD;  Location: [place] CV LAB;  Service: Cardiovascular;  Laterality: N/A;  . ESOPHAGOGASTRODUODENOSCOPY (EGD) WITH PROPOFOL N/A 07/25/2018   Procedure: ESOPHAGOGASTRODUODENOSCOPY (EGD) WITH PROPOFOL;  Surgeon: Thornton Park, MD;  Location: [place];  Service: Gastroenterology;  Laterality: N/A;  . KNEE RECONSTRUCTION Left 1980  . LAPAROSCOPIC CHOLECYSTECTOMY    . LEFT HEART CATH AND CORONARY ANGIOGRAPHY N/A 10/27/2018   Procedure: LEFT HEART CATH AND CORONARY ANGIOGRAPHY;  Surgeon: Sherren Mocha, MD;  Location: [place] CV LAB;  Service: Cardiovascular;  Laterality: N/A;  . SUBCLAVIAN STENT PLACEMENT Left 2010  . TONSILLECTOMY  1970  . UPPER EXTREMITY ANGIOGRAPHY Right 06/14/2019   Procedure: UPPER EXTREMITY ANGIOGRAPHY;  Surgeon: Lorretta Harp, MD;  Location: [place] CV LAB;  Service: Cardiovascular;  Laterality: Right;  upper ext     OB History    Gravida  6   Para  2   Term  2   Preterm      AB  4   Living  2     SAB  4   TAB      Ectopic      Multiple      Live Births              Family History  Problem Relation Age of Onset  . Diabetes Mother   . Hypertension Mother   . Hyperlipidemia Mother   . CAD Mother   . Cervical cancer Mother   . CAD Father   . Heart failure Father   . Testicular cancer Father   . Hypertension Sister   . Hyperlipidemia Sister   . Hypertension Brother   . Hyperlipidemia Brother     Social History   Tobacco Use  . Smoking status: Former Smoker    Packs/day: 2.00    Years: 36.00    Pack years: 72.00    Types: Cigarettes    Quit date: 05/14/2014    Years since quitting: 5.5  . Smokeless tobacco: Never Used  Substance Use Topics  . Alcohol use: Not Currently  . Drug use: Not  Currently    Home Medications Prior to Admission medications   Medication Sig Start Date End Date Taking? Authorizing Provider  Accu-Chek FastClix Lancets MISC 1 Package by Does not apply route 3 (three) times daily. 11/17/18   Shamleffer, Melanie Crazier, MD  aspirin 81 MG tablet Take 1 tablet (81 mg total) by mouth daily. 10/31/18   Regalado, Belkys A, MD  atorvastatin (LIPITOR) 80 MG tablet Take 1 tablet (80 mg total) by mouth at bedtime. 08/21/19   Marty Heck, MD  Blood Glucose Monitoring Suppl (CONTOUR NEXT ONE) KIT 1 kit by Does not apply route as directed. 07/02/18   Patrecia Pour, MD  Cholecalciferol (VITAMIN D3) 25 MCG (1000 UT) CAPS Take 1,000-2,000 Units by mouth See admin instructions. Take 2 tablets by mouth once  a day on Mon/Wed/Fri and take 1 tablet on Sun/Tues/Thurs/Sat    [provider]  diclofenac Sodium (VOLTAREN) 1 % GEL Apply 4 g topically 4 (four) times daily. 10/22/19   Shirley, [place], DO  EPINEPHrine 0.3 mg/0.3 mL IJ SOAJ injection Inject 0.3 mg into the muscle once as needed for anaphylaxis (for an anaphylactic reaction).  02/08/18   [provider]  FLUoxetine (PROZAC) 20 MG tablet Take 60 mg by mouth daily.     [provider]  fluticasone-salmeterol (ADVAIR HFA) 115-21 MCG/ACT inhaler Inhale 2 puffs into the lungs 2 (two) times daily. 09/24/19   Shirley, [place], DO  furosemide (LASIX) 20 MG tablet TAKE 1 TABLET BY MOUTH  TWICE DAILY 10/02/19   Josue Hector, MD  GLUCAGON EMERGENCY 1 MG injection Inject 1 mg into the vein once as needed (FOR ONSET OF HYPOGLYCEMIA).  02/09/18   [provider]  glucose blood (CONTOUR NEXT TEST) test strip Use as instructed 01/30/19   Shamleffer, Melanie Crazier, MD  Insulin Glargine (LANTUS SOLOSTAR) 100 UNIT/ML Solostar Pen Inject 42 Units into the skin 2 (two) times daily. 09/04/19   Shamleffer, Melanie Crazier, MD  insulin lispro (HUMALOG KWIKPEN) 100 UNIT/ML KwikPen Inject 0.25 mLs (25 Units total)  into the skin 3 (three) times daily. Patient taking differently: Inject 25 Units into the skin 3 (three) times daily. 200U/ml 08/27/19   Shamleffer, Melanie Crazier, MD  Insulin Pen Needle (PEN NEEDLES) 32G X 4 MM MISC 1 Device by Does not apply route 5 (five) times daily. 07/20/19   Shamleffer, Melanie Crazier, MD  lamoTRIgine (LAMICTAL) 200 MG tablet Take 200 mg by mouth daily.  11/29/18   [provider]  levothyroxine (SYNTHROID, LEVOTHROID) 150 MCG tablet Take 150 mcg by mouth at bedtime. 3:30 Am    [provider]  losartan (COZAAR) 100 MG tablet Take 1 tablet (100 mg total) by mouth daily. 04/03/19   Josue Hector, MD  Lurasidone HCl (LATUDA) 60 MG TABS Take 60 mg by mouth at bedtime.     [provider]  metoprolol tartrate (LOPRESSOR) 50 MG tablet Take 1 tablet (50 mg total) by mouth 2 (two) times daily. 04/04/19   Josue Hector, MD  Multiple Vitamin (MULTIVITAMIN WITH MINERALS) TABS tablet Take 1 tablet by mouth daily. Woman 52 +    [provider]  nitroGLYCERIN (NITROSTAT) 0.4 MG SL tablet Place 1 tablet (0.4 mg total) under the tongue every 5 (five) minutes x 3 doses as needed for chest pain. 05/18/19   Meccariello, Bernita Raisin, DO  nystatin (MYCOSTATIN/NYSTOP) powder Apply topically as needed. Patient taking differently: Apply 1 g topically daily as needed (infection).  02/27/19   Shirley, [place], DO  nystatin ointment (MYCOSTATIN) Apply 1 application topically daily as needed (infection).    [provider]  pantoprazole (PROTONIX) 40 MG tablet Take 1 tablet (40 mg total) by mouth 2 (two) times daily before a meal. X 8 weeks Patient taking differently: Take 20 mg by mouth daily.  07/26/18   Rai, Ripudeep K, MD  potassium chloride (KLOR-CON) 10 MEQ tablet Take 10 mEq by mouth 2 (two) times daily. 07/23/19   [provider]  SUMAtriptan (IMITREX) 25 MG tablet Take 1 tablet (25 mg total) by mouth every 2 (two) hours as needed for migraine.  May repeat in 2 hours if headache persists or recurs. Please take as soon as you feel your migraine starting. 09/05/19   Shirley, [place], DO  ticagrelor [facility]) 90  MG TABS tablet Take 1 tablet (90 mg total) by mouth 2 (two) times daily. 04/04/19   Josue Hector, MD  topiramate (TOPAMAX) 25 MG tablet Take 1 tablet (25 mg total) by mouth at bedtime. 09/05/19   Shirley, [place], DO  topiramate (TOPAMAX) 50 MG tablet Take 1 tablet (50 mg total) by mouth at bedtime. 07/23/19   Shirley, [place], DO  traMADol (ULTRAM) 50 MG tablet Take 50 mg by mouth every 6 (six) hours as needed for moderate pain (Migraine).    [provider]  VENTOLIN HFA 108 (90 Base) MCG/ACT inhaler INHALE 2 INHALATIONS BY  MOUTH INTO THE LUNGS TWICE  DAILY AS NEEDED FOR  WHEEZING OR SHORTNESS OF  BREATH 09/24/19   Shirley, [place], DO    Allergies    Ilosone [erythromycin], Keflex [cephalexin], Penicillins, Iron, Metformin and related, and Morphine and related  Review of Systems   Review of Systems  Constitutional: Positive for chills. Negative for fever.  Respiratory: Positive for cough (chronic unchanged) and shortness of breath (chronic unchanged).   Cardiovascular: Negative for chest pain and leg swelling.  Gastrointestinal: Positive for abdominal pain.  Endocrine: Positive for polydipsia.  Genitourinary: Negative for dysuria.  Neurological: Positive for dizziness and light-headedness. Negative for syncope.  All other systems reviewed and are negative.   Physical Exam Updated Vital Signs BP (!) 129/93 (BP Location: Left Arm)   Pulse 70   Temp 98.8 F (37.1 C) (Oral)   Resp 16   LMP 08/03/2013   SpO2 98%   Physical Exam Vitals and nursing note reviewed.  Constitutional:      General: She is not in acute distress.    Appearance: She is well-developed. She is not toxic-appearing.  HENT:     Head: Normocephalic and atraumatic.     Mouth/Throat:     Mouth: Mucous membranes are dry.  Eyes:     General:          Right eye: No discharge.        Left eye: No discharge.     Conjunctiva/sclera: Conjunctivae normal.  Cardiovascular:     Rate and Rhythm: Regular rhythm. Tachycardia present.  Pulmonary:     Effort: Pulmonary effort is normal. No respiratory distress.     Breath sounds: Normal breath sounds. No wheezing, rhonchi or rales.  Abdominal:     General: There is no distension.     Palpations: Abdomen is soft.     Tenderness: There is abdominal tenderness (generalized). There is no guarding or rebound.  Musculoskeletal:     Cervical back: Neck supple.  Skin:    General: Skin is warm and dry.     Findings: No rash.  Neurological:     Mental Status: She is alert.     Comments: Clear speech.   Psychiatric:        Behavior: Behavior normal.     ED Results / Procedures / Treatments   Labs (all labs ordered are listed, but only abnormal results are displayed) Labs Reviewed  COMPREHENSIVE METABOLIC PANEL - Abnormal; Notable for the following components:      Result Value   Sodium 130 (*)    Potassium 3.3 (*)    Chloride 91 (*)    CO2 20 (*)    Glucose, Bld 328 (*)    Creatinine, Ser 1.09 (*)    Total Bilirubin 1.3 (*)    GFR calc non Af Amer 57 (*)    Anion gap 19 (*)  All other components within normal limits  CBC - Abnormal; Notable for the following components:   WBC 18.1 (*)    RBC 8.30 (*)    Hemoglobin 16.2 (*)    HCT 51.9 (*)    MCV 62.5 (*)    MCH 19.5 (*)    RDW 21.1 (*)    Platelets 403 (*)    All other components within normal limits  CBG MONITORING, ED - Abnormal; Notable for the following components:   Glucose-Capillary 419 (*)    All other components within normal limits  LIPASE, BLOOD  URINALYSIS, ROUTINE W REFLEX MICROSCOPIC    EKG EKG Interpretation  Date/Time:  Saturday November 24 2019 08:56:30 EDT Ventricular Rate:  110 PR Interval:    QRS Duration: 86 QT Interval:  355 QTC Calculation: 481 R Axis:   35 Text Interpretation: Sinus  tachycardia Consider right atrial enlargement No STEMI Confirmed by Octaviano Glow [phone]) on 11/24/2019 9:13:30 AM   Radiology CT Chest W Contrast  Result Date: 11/24/2019 CLINICAL DATA:  Unspecified abdominal pain. Persistent cough. Nausea and vomiting for the past 3 days. EXAM: CT CHEST, ABDOMEN, AND PELVIS WITH CONTRAST TECHNIQUE: Multidetector CT imaging of the chest, abdomen and pelvis was performed following the standard protocol during bolus administration of intravenous contrast. CONTRAST:  117m OMNIPAQUE IOHEXOL 300 MG/ML  SOLN COMPARISON:  Chest CT-04/07/2019; 11/13/2007; CT the chest, abdomen and pelvis-08/09/2018 FINDINGS: CT CHEST FINDINGS Cardiovascular: Normal heart size. No pericardial effusion. Coronary calcifications/coronary stent placement. No pericardial effusion. Although this examination was not tailored for the evaluation the pulmonary arteries, there are no discrete filling defects within the central pulmonary arterial tree to suggest central pulmonary embolism. Normal caliber of the main pulmonary artery. Normal caliber of the thoracic aorta. Patient has undergone left common carotid to subclavian bypass grafting which appears patent on this non CTA examination. There is persistent occlusion of the left subclavian artery despite previous stent placement. The remaining branch vessels of the aortic arch appear widely patent throughout their imaged courses. There is a minimal amount of mixed calcified and noncalcified atherosclerotic plaque involving the descending thoracic aorta, not resulting in hemodynamically significant stenosis. Mediastinum/Nodes: No bulky mediastinal, hilar or axillary lymphadenopathy. Lungs/Pleura: Interval development of left lower lobe consolidative opacities with associated volume loss and air bronchograms. Minimal right basilar subpleural heterogeneous opacities favored to represent atelectasis or scar. Punctate (approximately 7 mm) suspected perifissural  lymph node within the peripheral aspect of the right major fissure (image 69, series 5), is unchanged compared to the 2009 examination. As is centrally calcified granuloma within the anterior aspect of the left upper lobe (image 62, series 5). No new pulmonary nodules given limitation of the examination. No pleural effusion or pneumothorax. The central pulmonary airways appear patent. Musculoskeletal: No acute or aggressive osseous abnormalities. 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 _________________________________________________________ CT ABDOMEN PELVIS FINDINGS Hepatobiliary: Mild nodularity hepatic contour. This finding is associated with mild diffuse decreased attenuation hepatic parenchyma on this postcontrast examination suggestive hepatic steatosis. No discrete hepatic lesions. Post cholecystectomy. No intra or extrahepatic biliary ductal dilatation. No ascites. Pancreas: Normal appearance of the pancreas. Spleen: Normal appearance of the spleen. Adrenals/Urinary Tract: There is symmetric enhancement and excretion of the bilateral kidneys. Mild geographic atrophy involving the inferior pole of the right kidney. No definite evidence of nephrolithiasis on this postcontrast examination. No discrete renal lesions. No urine obstruction or perinephric stranding. There is mild thickening of the left adrenal gland without discrete  nodule. Normal appearance of the right adrenal gland. Normal appearance of the urinary bladder given degree distention. Stomach/Bowel: The bowel is normal in course and caliber without evidence of enteric obstruction. Normal appearance of the terminal ileum and appendix. No discrete areas of bowel wall thickening. No pneumoperitoneum, pneumatosis or portal venous gas. Vascular/Lymphatic: Moderate amount of mixed calcified and noncalcified atherosclerotic plaque throughout the abdominal aorta, not resulting in a  hemodynamically significant stenosis. The left gastric artery is incidentally noted to arise directly from the abdominal aorta. No bulky retroperitoneal, mesenteric, pelvic or inguinal lymphadenopathy. Reproductive: Post hysterectomy.  No discrete adnexal lesion. Other: Well-healed midline lower abdominal ventral wall incision. Musculoskeletal: No acute or aggressive osseous abnormalities. Mild degenerative change of the right hip with joint space loss, subchondral sclerosis and osteophytosis. IMPRESSION: Chest CT impression: 1. Left lower lobe consolidative opacities with associated volume loss and air bronchograms, potentially atelectasis though underlying infection not excluded. 2. 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 3. Coronary calcifications.  Aortic Atherosclerosis (ICD10-I70.0). Abdomen and pelvic CT impression: 1. No explanation for patient's abdominal pain. Specifically, no evidence of enteric or urinary obstruction. Normal appearance of the appendix. 2. Suspected hepatic steatosis with mild nodularity hepatic contour as could be seen the setting of early cirrhotic change. Correlation with LFTs is advised. 3.  Aortic Atherosclerosis (ICD10-I70.0). Electronically Signed   By: Sandi Mariscal M.D.   On: 11/24/2019 13:42   CT Abdomen Pelvis W Contrast  Result Date: 11/24/2019 CLINICAL DATA:  Unspecified abdominal pain. Persistent cough. Nausea and vomiting for the past 3 days. EXAM: CT CHEST, ABDOMEN, AND PELVIS WITH CONTRAST TECHNIQUE: Multidetector CT imaging of the chest, abdomen and pelvis was performed following the standard protocol during bolus administration of intravenous contrast. CONTRAST:  1105m OMNIPAQUE IOHEXOL 300 MG/ML  SOLN COMPARISON:  Chest CT-04/07/2019; 11/13/2007; CT the chest, abdomen and pelvis-08/09/2018 FINDINGS: CT CHEST FINDINGS Cardiovascular: Normal heart size. No pericardial effusion. Coronary  calcifications/coronary stent placement. No pericardial effusion. Although this examination was not tailored for the evaluation the pulmonary arteries, there are no discrete filling defects within the central pulmonary arterial tree to suggest central pulmonary embolism. Normal caliber of the main pulmonary artery. Normal caliber of the thoracic aorta. Patient has undergone left common carotid to subclavian bypass grafting which appears patent on this non CTA examination. There is persistent occlusion of the left subclavian artery despite previous stent placement. The remaining branch vessels of the aortic arch appear widely patent throughout their imaged courses. There is a minimal amount of mixed calcified and noncalcified atherosclerotic plaque involving the descending thoracic aorta, not resulting in hemodynamically significant stenosis. Mediastinum/Nodes: No bulky mediastinal, hilar or axillary lymphadenopathy. Lungs/Pleura: Interval development of left lower lobe consolidative opacities with associated volume loss and air bronchograms. Minimal right basilar subpleural heterogeneous opacities favored to represent atelectasis or scar. Punctate (approximately 7 mm) suspected perifissural lymph node within the peripheral aspect of the right major fissure (image 69, series 5), is unchanged compared to the 2009 examination. As is centrally calcified granuloma within the anterior aspect of the left upper lobe (image 62, series 5). No new pulmonary nodules given limitation of the examination. No pleural effusion or pneumothorax. The central pulmonary airways appear patent. Musculoskeletal: No acute or aggressive osseous abnormalities. 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 _________________________________________________________ CT ABDOMEN PELVIS FINDINGS Hepatobiliary: Mild nodularity hepatic contour. This finding is associated with mild  diffuse decreased attenuation hepatic  parenchyma on this postcontrast examination suggestive hepatic steatosis. No discrete hepatic lesions. Post cholecystectomy. No intra or extrahepatic biliary ductal dilatation. No ascites. Pancreas: Normal appearance of the pancreas. Spleen: Normal appearance of the spleen. Adrenals/Urinary Tract: There is symmetric enhancement and excretion of the bilateral kidneys. Mild geographic atrophy involving the inferior pole of the right kidney. No definite evidence of nephrolithiasis on this postcontrast examination. No discrete renal lesions. No urine obstruction or perinephric stranding. There is mild thickening of the left adrenal gland without discrete nodule. Normal appearance of the right adrenal gland. Normal appearance of the urinary bladder given degree distention. Stomach/Bowel: The bowel is normal in course and caliber without evidence of enteric obstruction. Normal appearance of the terminal ileum and appendix. No discrete areas of bowel wall thickening. No pneumoperitoneum, pneumatosis or portal venous gas. Vascular/Lymphatic: Moderate amount of mixed calcified and noncalcified atherosclerotic plaque throughout the abdominal aorta, not resulting in a hemodynamically significant stenosis. The left gastric artery is incidentally noted to arise directly from the abdominal aorta. No bulky retroperitoneal, mesenteric, pelvic or inguinal lymphadenopathy. Reproductive: Post hysterectomy.  No discrete adnexal lesion. Other: Well-healed midline lower abdominal ventral wall incision. Musculoskeletal: No acute or aggressive osseous abnormalities. Mild degenerative change of the right hip with joint space loss, subchondral sclerosis and osteophytosis. IMPRESSION: Chest CT impression: 1. Left lower lobe consolidative opacities with associated volume loss and air bronchograms, potentially atelectasis though underlying infection not excluded. 2. Post left carotid to subclavian artery  bypass grafting which appears patent on this non CTA examination. 3. Coronary calcifications.  Aortic Atherosclerosis (ICD10-I70.0). Abdomen and pelvic CT impression: 1. No explanation for patient's abdominal pain. Specifically, no evidence of enteric or urinary obstruction. Normal appearance of the appendix. 2. Suspected hepatic steatosis with mild nodularity hepatic contour as could be seen the setting of early cirrhotic change. Correlation with LFTs is advised. 3.  Aortic Atherosclerosis (ICD10-I70.0). Electronically Signed   By: Sandi Mariscal M.D.   On: 11/24/2019 13:42   DG Chest Port 1 View  Result Date: 11/24/2019 CLINICAL DATA:  Nausea vomiting for 3 days of increased weakness and dizziness EXAM: PORTABLE CHEST 1 VIEW COMPARISON:  05/12/2019 FINDINGS: Interval postoperative change in the LEFT supraclavicular region. Elevation of the LEFT hemidiaphragm since the previous study associated with LEFT lower lobe airspace disease. Cardiomediastinal contours partially obscured by elevated LEFT hemidiaphragm. Otherwise stable. Signs of LEFT subclavian stenting. RIGHT lung is clear. No acute bone process. IMPRESSION: 1. LEFT lower lobe airspace disease with elevation of the LEFT hemidiaphragm, new from the prior study. Findings could be related to diaphragmatic paralysis or dysfunction. 2. LEFT lower lobe airspace disease may represent developing pneumonia or juxta diaphragmatic atelectasis. 3. 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 Electronically Signed   By: Zetta Bills M.D.   On: 11/24/2019 10:21    Procedures .Critical Care Performed by: Amaryllis Dyke, PA-C Authorized by: Amaryllis Dyke, PA-C    CRITICAL CARE Performed by: Kennith Maes   Total critical care time: 40 minutes  Critical care time was exclusive of separately billable procedures and treating other  patients.  Critical care was necessary to treat or prevent imminent or life-threatening deterioration.  Critical care was time spent personally by me on the following activities: development of treatment plan with patient and/or surrogate as well as nursing, discussions with consultants, evaluation of patient's response to treatment, examination of patient, obtaining history from patient or surrogate, ordering and  performing treatments and interventions, ordering and review of laboratory studies, ordering and review of radiographic studies, pulse oximetry and re-evaluation of patient's condition.   (including critical care time)  Medications Ordered in ED Medications  sodium chloride flush (NS) 0.9 % injection 3 mL (has no administration in time range)  insulin regular, human (MYXREDLIN) 100 units/ 100 mL infusion (5 Units/hr Intravenous Rate/Dose Change 11/24/19 1313)  0.9 %  sodium chloride infusion ( Intravenous New Bag/Given 11/24/19 1059)  dextrose 5 %-0.45 % sodium chloride infusion ( Intravenous New Bag/Given 11/24/19 1317)  dextrose 50 % solution 0-50 mL (has no administration in time range)  potassium chloride 10 mEq in 100 mL IVPB (10 mEq Intravenous New Bag/Given 11/24/19 1348)  doxycycline (VIBRA-TABS) tablet 100 mg (has no administration in time range)  sodium chloride 0.9 % bolus 1,000 mL (0 mLs Intravenous Stopped 11/24/19 1204)  ondansetron (ZOFRAN) injection 4 mg (4 mg Intravenous Given 11/24/19 0951)  fentaNYL (SUBLIMAZE) injection 50 mcg (50 mcg Intravenous Given 11/24/19 0951)  iohexol (OMNIPAQUE) 300 MG/ML solution 100 mL (100 mLs Intravenous Contrast Given 11/24/19 1252)    ED Course  I have reviewed the triage vital signs and the nursing notes.  Pertinent labs & imaging results that were available during my care of the patient were reviewed by me and considered in my medical decision making (see chart for details). FRANCHON KETTERMAN was evaluated in Emergency Department on  11/24/2019 for the symptoms described in the history of present illness. He/she was evaluated in the context of the global COVID-19 pandemic, which necessitated consideration that the patient might be at risk for infection with the SARS-CoV-2 virus that causes COVID-19. Institutional protocols and algorithms that pertain to the evaluation of patients at risk for COVID-19 are in a state of rapid change based on information released by regulatory bodies including the CDC and federal and state organizations. These policies and algorithms were followed during the patient's care in the ED.   MDM Rules/Calculators/A&P                      Patient presents to the ED with complaints of N/V x 3-4 days. Patient is nontoxic, vitals with mild tachycardia. MM are dry. Generalized abdominal tenderness present without peritoneal signs. DDx: perf, obstruction, cholecystitis, appendicitis, pancreatitis, DKA, pyelonephritis, pneumonia, viral process.   Additional history obtained:  Previous records obtained and reviewed for additional history.   EKG: Sinus tachycardia Consider right atrial enlargement No STEMI  QTc WNL Lab Tests:  Reviewed & interpreted labs, which included:  CBC:  Leukocytosis @ 18.2. elevated hgb/hct- suspect degree of hemoconcentration with dehydration. Elevated platelets similar to prior.  CMP: Concern for DKA with hyperglycemia, bicarb 20, anion gap of 19. Mild electrolyte abnormalities as above- IV NS ordered & IV K started.  Lipase; WNL UA: Pending.   Repeat CBG 419  Imaging Studies ordered:  I ordered imaging studies which included chest xray & CT chest/A/P.  I independently visualized and interpreted imaging which showed:  CXR:  1. LEFT lower lobe airspace disease with elevation of the LEFT hemidiaphragm, new from the prior study. Findings could be related to diaphragmatic paralysis or dysfunction. 2. LEFT lower lobe airspace disease may represent developing pneumonia or juxta  diaphragmatic atelectasis. 3. 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  CT chest/abdomen/pelvis: Left lower lobe consolidative opacities with associated volume loss and air bronchograms, potentially atelectasis though underlying infection  not excluded.  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 Coronary calcifications.  Aortic Atherosclerosis (ICD10-I70.0).  No explanation for patient's abdominal pain. Specifically, no evidence of enteric or urinary obstruction. Normal appearance of the appendix. Suspected hepatic steatosis with mild nodularity hepatic contour as could be seen the setting of early cirrhotic change. Correlation with LFTs is advised. Aortic Atherosclerosis  Medicines ordered:  I ordered medications including fentanyl & zofran for symptomatic control, insulin drip due to concern for DKA, and subsequently doxycycline to cover for CAP based on CT imaging of the chest.   Re-Evaluations:  13:55: RE-EVAL: Patient states improvement in sxs in general, remains somewhat nauseated though, will given 0.5 mg of IV ativan.   DKA likely from medication noncompliance, however possibly infectious contribution with pneumonia.  Will consult family practice service for admission.   This is a shared visit with supervising physician Dr. Langston Masker who has independently evaluated patient & provided guidance in evaluation/management/disposition, in agreement with care   14:21: CONSULT: Discussed case with family practice service- accept admission.   Portions of this note were generated with [employer]. Dictation errors may occur despite best attempts at proofreading.  Final Clinical Impression(s) / ED Diagnoses Final diagnoses:  Diabetic ketoacidosis without coma associated with type 2 diabetes mellitus ([place])  Pneumonia of left lower lobe  due to infectious organism    Rx / DC Orders ED Discharge Orders    None       Amaryllis Dyke, PA-C 11/24/19 1431    Wyvonnia Dusky, MD 11/24/19 1734

## 2019-11-24 NOTE — ED Triage Notes (Signed)
Patient c/o N/V x3 days with increased weakness/dizziness today. Per EMS, multiple episodes of dry-heaving, some dark emesis. 4mg  Zofran,  150 Normal Saline given by EMS.

## 2019-11-24 NOTE — Progress Notes (Signed)
CALL PAGER [phone] for any questions or notifications regarding this patient  FMTS Attending Note: Morgan Mcmurray MD I have seen and examined Morgan [name]/ Full note/H&P by resident to [facility]. Obtaining blood work has been problematic and they were unable to get repeat BNP and she has been on insulin drip. Nurse retried drawing from IV but to no avail, so I will d/c insulin drip for now. Her CBG was 202, We have placed order for midline access. I do not think she will be going home today. She remains quite nauseated and is a little somnolent but that may be from the [place] ED gave her for nausea. She said that did not work swell so we will give additional dose of zofran which she says was helpful.

## 2019-11-24 NOTE — ED Notes (Signed)
Instructed by Nori Riis MD attending to stop insulin drip. Morgan [name]

## 2019-11-24 NOTE — Progress Notes (Signed)
Spoke with Dr Volanda Napoleon re IV access for labs.  Order for midline placement placed.  States may be d/c home once lab results obtained.  States phlebotomy x2 cannot get labs from pt.

## 2019-11-25 DIAGNOSIS — J189 Pneumonia, unspecified organism: Secondary | ICD-10-CM | POA: Diagnosis present

## 2019-11-25 DIAGNOSIS — E860 Dehydration: Secondary | ICD-10-CM | POA: Diagnosis present

## 2019-11-25 DIAGNOSIS — R1084 Generalized abdominal pain: Secondary | ICD-10-CM | POA: Diagnosis present

## 2019-11-25 DIAGNOSIS — I708 Atherosclerosis of other arteries: Secondary | ICD-10-CM | POA: Diagnosis present

## 2019-11-25 DIAGNOSIS — K760 Fatty (change of) liver, not elsewhere classified: Secondary | ICD-10-CM | POA: Diagnosis present

## 2019-11-25 DIAGNOSIS — F3130 Bipolar disorder, current episode depressed, mild or moderate severity, unspecified: Secondary | ICD-10-CM | POA: Diagnosis present

## 2019-11-25 DIAGNOSIS — T383X6A Underdosing of insulin and oral hypoglycemic [antidiabetic] drugs, initial encounter: Secondary | ICD-10-CM | POA: Diagnosis present

## 2019-11-25 DIAGNOSIS — I2510 Atherosclerotic heart disease of native coronary artery without angina pectoris: Secondary | ICD-10-CM | POA: Diagnosis present

## 2019-11-25 DIAGNOSIS — E876 Hypokalemia: Secondary | ICD-10-CM | POA: Diagnosis not present

## 2019-11-25 DIAGNOSIS — K219 Gastro-esophageal reflux disease without esophagitis: Secondary | ICD-10-CM | POA: Diagnosis present

## 2019-11-25 DIAGNOSIS — E785 Hyperlipidemia, unspecified: Secondary | ICD-10-CM | POA: Diagnosis present

## 2019-11-25 DIAGNOSIS — I959 Hypotension, unspecified: Secondary | ICD-10-CM | POA: Diagnosis not present

## 2019-11-25 DIAGNOSIS — M545 Low back pain: Secondary | ICD-10-CM | POA: Diagnosis present

## 2019-11-25 DIAGNOSIS — I252 Old myocardial infarction: Secondary | ICD-10-CM | POA: Diagnosis not present

## 2019-11-25 DIAGNOSIS — E039 Hypothyroidism, unspecified: Secondary | ICD-10-CM | POA: Diagnosis present

## 2019-11-25 DIAGNOSIS — E11649 Type 2 diabetes mellitus with hypoglycemia without coma: Secondary | ICD-10-CM | POA: Diagnosis not present

## 2019-11-25 DIAGNOSIS — R739 Hyperglycemia, unspecified: Secondary | ICD-10-CM | POA: Diagnosis not present

## 2019-11-25 DIAGNOSIS — I110 Hypertensive heart disease with heart failure: Secondary | ICD-10-CM | POA: Diagnosis present

## 2019-11-25 DIAGNOSIS — R000 Tachycardia, unspecified: Secondary | ICD-10-CM | POA: Diagnosis present

## 2019-11-25 DIAGNOSIS — R0602 Shortness of breath: Secondary | ICD-10-CM | POA: Diagnosis not present

## 2019-11-25 DIAGNOSIS — I5022 Chronic systolic (congestive) heart failure: Secondary | ICD-10-CM | POA: Diagnosis present

## 2019-11-25 DIAGNOSIS — D563 Thalassemia minor: Secondary | ICD-10-CM | POA: Diagnosis present

## 2019-11-25 DIAGNOSIS — Z20822 Contact with and (suspected) exposure to covid-19: Secondary | ICD-10-CM | POA: Diagnosis present

## 2019-11-25 DIAGNOSIS — E1110 Type 2 diabetes mellitus with ketoacidosis without coma: Secondary | ICD-10-CM | POA: Diagnosis present

## 2019-11-25 DIAGNOSIS — D72829 Elevated white blood cell count, unspecified: Secondary | ICD-10-CM | POA: Diagnosis present

## 2019-11-25 DIAGNOSIS — J9811 Atelectasis: Secondary | ICD-10-CM | POA: Diagnosis present

## 2019-11-25 LAB — COMPREHENSIVE METABOLIC PANEL
ALT: 24 U/L (ref 0–44)
AST: 22 U/L (ref 15–41)
Albumin: 2.8 g/dL — ABNORMAL LOW (ref 3.5–5.0)
Alkaline Phosphatase: 63 U/L (ref 38–126)
Anion gap: 16 — ABNORMAL HIGH (ref 5–15)
BUN: 12 mg/dL (ref 6–20)
CO2: 19 mmol/L — ABNORMAL LOW (ref 22–32)
Calcium: 8 mg/dL — ABNORMAL LOW (ref 8.9–10.3)
Chloride: 100 mmol/L (ref 98–111)
Creatinine, Ser: 1.03 mg/dL — ABNORMAL HIGH (ref 0.44–1.00)
GFR calc Af Amer: >60 mL/min (ref >60)
GFR calc non Af Amer: >60 mL/min (ref >60)
Glucose, Bld: 313 mg/dL — ABNORMAL HIGH (ref 70–99)
Potassium: 2.8 mmol/L — ABNORMAL LOW (ref 3.5–5.1)
Sodium: 135 mmol/L (ref 135–145)
Total Bilirubin: 2.1 mg/dL — ABNORMAL HIGH (ref 0.3–1.2)
Total Protein: 4.8 g/dL — ABNORMAL LOW (ref 6.5–8.1)

## 2019-11-25 LAB — BASIC METABOLIC PANEL
Anion gap: 11 (ref 5–15)
BUN: 9 mg/dL (ref 6–20)
CO2: 22 mmol/L (ref 22–32)
Calcium: 8 mg/dL — ABNORMAL LOW (ref 8.9–10.3)
Chloride: 100 mmol/L (ref 98–111)
Creatinine, Ser: 0.87 mg/dL (ref 0.44–1.00)
GFR calc Af Amer: >60 mL/min (ref >60)
GFR calc non Af Amer: >60 mL/min (ref >60)
Glucose, Bld: 393 mg/dL — ABNORMAL HIGH (ref 70–99)
Potassium: 3.9 mmol/L (ref 3.5–5.1)
Sodium: 133 mmol/L — ABNORMAL LOW (ref 135–145)

## 2019-11-25 LAB — MAGNESIUM: Magnesium: 1.6 mg/dL — ABNORMAL LOW (ref 1.7–2.4)

## 2019-11-25 LAB — GLUCOSE, CAPILLARY
Glucose-Capillary: 294 mg/dL — ABNORMAL HIGH (ref 70–99)
Glucose-Capillary: 285 mg/dL — ABNORMAL HIGH (ref 70–99)
Glucose-Capillary: 382 mg/dL — ABNORMAL HIGH (ref 70–99)
Glucose-Capillary: 218 mg/dL — ABNORMAL HIGH (ref 70–99)

## 2019-11-25 LAB — HIV ANTIBODY (ROUTINE TESTING W REFLEX): HIV Screen 4th Generation wRfx: NONREACTIVE

## 2019-11-25 MED ORDER — POTASSIUM CHLORIDE 10 MEQ/100ML IV SOLN
10.0000 meq | INTRAVENOUS | Status: AC
  Administered 2019-11-25 (×4): 10 meq via INTRAVENOUS
  Filled 2019-11-25 (×4): qty 100

## 2019-11-25 MED ORDER — METOPROLOL TARTRATE 12.5 MG HALF TABLET
12.5000 mg | ORAL_TABLET | Freq: Two times a day (BID) | ORAL | Status: DC
  Administered 2019-11-25 – 2019-11-27 (×4): 12.5 mg via ORAL
  Filled 2019-11-25 (×4): qty 1

## 2019-11-25 MED ORDER — PROCHLORPERAZINE EDISYLATE 10 MG/2ML IJ SOLN
5.0000 mg | Freq: Four times a day (QID) | INTRAMUSCULAR | Status: DC | PRN
  Administered 2019-11-25 – 2019-11-26 (×3): 5 mg via INTRAVENOUS
  Filled 2019-11-25 (×3): qty 2

## 2019-11-25 MED ORDER — POTASSIUM CHLORIDE CRYS ER 20 MEQ PO TBCR
40.0000 meq | EXTENDED_RELEASE_TABLET | Freq: Once | ORAL | Status: AC
  Administered 2019-11-25: 40 meq via ORAL
  Filled 2019-11-25: qty 2

## 2019-11-25 MED ORDER — SODIUM CHLORIDE 0.9 % IV BOLUS
1000.0000 mL | Freq: Once | INTRAVENOUS | Status: AC
  Administered 2019-11-25: 1000 mL via INTRAVENOUS

## 2019-11-25 MED ORDER — INSULIN GLARGINE 100 UNIT/ML ~~LOC~~ SOLN
25.0000 [IU] | Freq: Two times a day (BID) | SUBCUTANEOUS | Status: DC
  Filled 2019-11-25: qty 0.25

## 2019-11-25 MED ORDER — INSULIN ASPART 100 UNIT/ML ~~LOC~~ SOLN
0.0000 [IU] | Freq: Three times a day (TID) | SUBCUTANEOUS | Status: DC
  Administered 2019-11-25: 15 [IU] via SUBCUTANEOUS
  Administered 2019-11-26 (×2): 2 [IU] via SUBCUTANEOUS
  Administered 2019-11-26: 3 [IU] via SUBCUTANEOUS
  Administered 2019-11-27: 15 [IU] via SUBCUTANEOUS
  Administered 2019-11-27: 5 [IU] via SUBCUTANEOUS

## 2019-11-25 MED ORDER — ONDANSETRON HCL 4 MG/2ML IJ SOLN
4.0000 mg | Freq: Once | INTRAMUSCULAR | Status: AC
  Administered 2019-11-25: 4 mg via INTRAVENOUS
  Filled 2019-11-25: qty 2

## 2019-11-25 MED ORDER — ENOXAPARIN SODIUM 40 MG/0.4ML ~~LOC~~ SOLN
40.0000 mg | Freq: Every day | SUBCUTANEOUS | Status: DC
  Administered 2019-11-25 – 2019-11-27 (×3): 40 mg via SUBCUTANEOUS
  Filled 2019-11-25 (×3): qty 0.4

## 2019-11-25 MED ORDER — LEVOFLOXACIN 500 MG PO TABS
750.0000 mg | ORAL_TABLET | ORAL | Status: DC
  Administered 2019-11-25 – 2019-11-26 (×2): 750 mg via ORAL
  Filled 2019-11-25 (×2): qty 2

## 2019-11-25 MED ORDER — INSULIN GLARGINE 100 UNIT/ML ~~LOC~~ SOLN
25.0000 [IU] | Freq: Two times a day (BID) | SUBCUTANEOUS | Status: DC
  Administered 2019-11-25 – 2019-11-26 (×3): 25 [IU] via SUBCUTANEOUS
  Filled 2019-11-25 (×4): qty 0.25

## 2019-11-25 MED ORDER — INSULIN ASPART 100 UNIT/ML ~~LOC~~ SOLN
5.0000 [IU] | Freq: Three times a day (TID) | SUBCUTANEOUS | Status: DC
  Administered 2019-11-25 – 2019-11-27 (×5): 5 [IU] via SUBCUTANEOUS

## 2019-11-25 NOTE — Progress Notes (Signed)
Patients B/P was 71/48 and HR was 62. Patient was alert, but drowsy. Patient denied feeling lightheaded. MD notified. Higinio Plan, MD stated that she would be at the bedside. MD arrived to bedside to assess patient and placed orders for 1L NS bolus. Orders followed. Bolus given. B/P re-checked after completion of bolus and it was 147/74 and HR was 77. Patient states that she feels better. MD notified of updated status. Will continue to monitor.

## 2019-11-25 NOTE — Evaluation (Signed)
Occupational Therapy Evaluation Patient Details Name: Morgan [name] MRN: [id number] DOB: Dec 17, 1965 Today's Date: 11/25/2019    History of Present Illness Morgan [name] is a 54 y.o. female presenting with nausea and dry heaves. PMH is significant for CAD, DM type II, hypertension, hyperlipidemia, hypothyroidism, bipolar/depression/anxiety, cholecystectomy;  Likely hyperglycemia/DKA secondary to noncompliance with insulin regime   Clinical Impression   This 54 y/o female presents with the above. PTA pt reports independent-mod independence with ADL and functional mobility (reports intermittent use of SPC/walker). Pt currently presenting with general weakness, unsteadiness and ultimately limited due to fluctuating/low BP with activity today. Pt was up in shower with RN present upon arrival to room, reporting some dizziness with exiting shower therefore transitioned to recliner for dressing ADL. Pt completing room level mobility with +2 HHA to recliner. BP monitored while sitting and with additional standing/transfers from recliner (as pt reporting need to have BM). Ultimately BP soft, initially maintaining with SBP in the 90s but with prolonged time being upright while on BSC SBP decreasing, use of +2 for transition initially back to semi-reclined position in recliner from [facility] and ultimately back to supine/trendelenberg in bed as BP continued to remain low (see vitals flow sheet for full detail); pt reporting x1 instance of feeling "dazed" with initial stand from [facility] but otherwise was mostly still able to maintain conversation with therapists/RN even with low BP. RN in during this time and providing IV bolus once supine in bed. Suspect pt will progress well once her BP is better controlled. Currently recommend follow up [facility] services to progress pt's safety and independence with ADL and mobility. Will continue to follow while acutely admitted.     Follow Up Recommendations  Home health  OT;Supervision/Assistance - 24 hour(rec close to 24hr initially)    Equipment Recommendations  None recommended by OT           Precautions / Restrictions Precautions Precautions: Fall Precaution Comments: watch BP - drops with prolonged standing/being upright at time of eval Restrictions Weight Bearing Restrictions: No      Mobility Bed Mobility Overal bed mobility: Needs Assistance Bed Mobility: Sit to Supine       Sit to supine: Mod assist   General bed mobility comments: assist for LEs onto EOB  Transfers Overall transfer level: Needs assistance Equipment used: 1 person hand held assist;2 person hand held assist Transfers: Sit to/from [facility] Sit to Stand: Min guard;Min assist Stand pivot transfers: Min assist;+2 physical assistance       General transfer comment: fluctuating levels of assist given continued dizziness and with BP continuing to fluctuate/drop with activity today. pt able to stand without assist but requiring at least minA for static standing. MinA for stand pivot to [facility] but use of +2 assist for transfer BSC>recliner>EOB for added safety and given dizziness/BP status (see vitals section)     Balance Overall balance assessment: Needs assistance Sitting-balance support: Feet supported Sitting balance-Leahy Scale: Fair     Standing balance support: Single extremity supported;Bilateral upper extremity supported Standing balance-Leahy Scale: Fair Standing balance comment: improved with at least single UE support                           ADL either performed or assessed with clinical judgement   ADL Overall ADL's : Needs assistance/impaired Eating/Feeding: Set up;Sitting   Grooming: Set up;Min guard;Sitting   Upper Body Bathing: Supervision/ safety;Sitting   Lower Body Bathing: Min guard;Minimal  assistance;Sit to/from stand   Upper Body Dressing : Set up;Sitting   Lower Body Dressing: Minimal assistance;Sit  to/from stand Lower Body Dressing Details (indicate cue type and reason): pt able to don socks seated in recliner using figure 4 technique  Toilet Transfer: Minimal assistance;Stand-pivot;Ambulation Toilet Transfer Details (indicate cue type and reason): via HHA (+1-2).  Toileting- Clothing Manipulation and Hygiene: Maximal assistance;Sit to/from stand;+2 for physical assistance Toileting - Clothing Manipulation Details (indicate cue type and reason): increased assist end of session for pericare due to drop in BP/dizziness      Functional mobility during ADLs: Minimal assistance;+2 for safety/equipment General ADL Comments: pt was OOB in shower with RN present upon arrival. While standing after exiting shower pt reports some dizziness therefore transitioned to sitting in recliner. BP monitored prior to additional mobility (pt reporting need to have BM), and noted pt with soft BP (see vitals, opted to transfer to [facility] vs toilet given BP - semireclined in recliner post toileting given continued drop in BP while sitting on BSC and pt becoming less responsive - BP with minimal improvement noted when semi reclind so opted to return to bed and RN notified. pt left supine in bed end of session with RN present and bolus started (had pt in trendelenburg for a short period) . pt reports feeling "dazed" after standing from [facility] but for the majority was able to maintain conversation with therapists/RN despite having low/drop in BP                          Pertinent Vitals/Pain Pain Assessment: No/denies pain     Hand Dominance Right   Extremity/Trunk Assessment Upper Extremity Assessment Upper Extremity Assessment: Generalized weakness   Lower Extremity Assessment Lower Extremity Assessment: Defer to PT evaluation       Communication Communication Communication: No difficulties   Cognition Arousal/Alertness: Awake/alert Behavior During Therapy: WFL for tasks assessed/performed;Flat  affect Overall Cognitive Status: Impaired/Different from baseline Area of Impairment: Orientation                 Orientation Level: Disoriented to;Time             General Comments: fluctutating cognition (but suspect moreso due to drop in BP), pt able to answer homesetup/PLOF questions which appear accurate    General Comments       Exercises     Shoulder Instructions      Home Living Family/patient expects to be discharged to:: Private residence Living Arrangements: Children Available Help at Discharge: Family;Available PRN/intermittently Type of Home: House Home Access: Stairs to enter [employer] of Steps: 3 Entrance Stairs-Rails: None Home Layout: One level     Bathroom Shower/Tub: Occupational psychologist: Standard     Home Equipment: Cane - single point;Walker - 2 wheels;Bedside commode;Shower seat          Prior Functioning/Environment Level of Independence: Independent;Independent with assistive device(s)        Comments: reports intermittent use of SPC/walker. Working from home for [employer]        OT Problem List: Decreased strength;Decreased range of motion;Decreased activity tolerance;Impaired balance (sitting and/or standing);Decreased safety awareness;Decreased knowledge of use of DME or AE;Obesity      OT Treatment/Interventions: Self-care/ADL training;Therapeutic exercise;Energy conservation;DME and/or AE instruction;Therapeutic activities;Patient/family education;Balance training    OT Goals(Current goals can be found in the care plan section) Acute Rehab OT Goals Patient Stated Goal: return home  OT Goal Formulation: With  patient Time For Goal Achievement: 12/09/19 Potential to Achieve Goals: Good  OT Frequency: Min 2X/week   Barriers to D/C:            Co-evaluation PT/OT/SLP Co-Evaluation/Treatment: Yes Reason for Co-Treatment: For patient/therapist safety;Complexity of the patient's impairments  (multi-system involvement);To address functional/ADL transfers(overlap with PT/OT)   OT goals addressed during session: ADL's and self-care      AM-PAC OT "6 Clicks" Daily Activity     Outcome Measure Help from another person eating meals?: A Little Help from another person taking care of personal grooming?: A Little Help from another person toileting, which includes using toliet, bedpan, or urinal?: A Lot Help from another person bathing (including washing, rinsing, drying)?: A Little Help from another person to put on and taking off regular upper body clothing?: A Little Help from another person to put on and taking off regular lower body clothing?: A Little 6 Click Score: 17   End of Session Nurse Communication: Mobility status;Other (comment)(BP status)  Activity Tolerance: Patient tolerated treatment well;Treatment limited secondary to medical complications (Comment)(drop in BP) Patient left: in bed;with call bell/phone within reach;with nursing/sitter in room  OT Visit Diagnosis: Muscle weakness (generalized) (M62.81);Unsteadiness on feet (R26.81)                Time: [id number] OT Time Calculation (min): 58 min Charges:  OT General Charges $OT Visit: 1 Visit OT Evaluation $OT Eval Moderate Complexity: 1 Mod OT Treatments $Self Care/Home Management : 8-22 mins  Lou Cal, OT Acute Rehabilitation Services Pager [phone] Office [phone]   Raymondo Band 11/25/2019, 2:28 PM

## 2019-11-25 NOTE — Progress Notes (Signed)
Inpatient Diabetes Program Recommendations  AACE/ADA: New Consensus Statement on Inpatient Glycemic Control (2015)  Target Ranges:  Prepandial:   less than 140 mg/dL      Peak postprandial:   less than 180 mg/dL (1-2 hours)      Critically ill patients:  140 - 180 mg/dL   Results for Morgan [name], Morgan [name] (MRN [id number]) as of 11/25/2019 11:15  Ref. Range 11/24/2019 08:36 11/24/2019 10:16 11/24/2019 11:27 11/24/2019 12:35 11/24/2019 13:45 11/24/2019 14:54 11/24/2019 15:56 11/24/2019 20:49  Glucose-Capillary Latest Ref Range: 70 - 99 mg/dL 419 (H) 387 (H)  IV Insulin Drip Started 293 (H)  IV Insulin Drip 230 (H)  IV Insulin Drip 191 (H)  IV Insulin Drip 165 (H)  IV Insulin Drip Stopped 202 (H) 325 (H)  25 units LANTUS given at 10:09pm   Results for Morgan [name], Morgan [name] (MRN [id number]) as of 11/25/2019 11:15  Ref. Range 11/25/2019 06:46  Glucose-Capillary Latest Ref Range: 70 - 99 mg/dL 294 (H)  5 units NOVOLOG    Results for Morgan [name], Morgan [name] (MRN [id number]) as of 11/25/2019 11:15  Ref. Range 05/12/2019 23:49 07/30/2019 06:30 11/24/2019 22:28  Hemoglobin A1C Latest Ref Range: 4.8 - 5.6 % 12.6 (H) 14.2 (H) 14.5 (H)  (369 mg/dl)    Admit with: DKA/ Hyperglycemia/ DM Type II--pt told MD she hs not been taking insulin for about 2 weeks secondary to change in home routine  History: DM  Home DM Meds: Lantus 42 units BID       Humalog 25 units TID  Current Orders: Lantus 25 units BID      Novolog Sensitive Correction Scale/ SSI (0-9 units) TID AC + HS      Novolog 5 units TID     Note patient transitioned to SQ Insulin last PM.  Lantus not started until several hours after IV Insulin Drip stopped.    MD- Note Lantus dose increased today, however, not scheduled to get next dose until 10pm tonight.  No Lantus scheduled for this AM.  May consider starting increased dose of 25 units BID this AM.  Will have Diabetes Coordinator follow up with pt in  person tomorrow AM (DM Coordinator not on [facility] today)    --Will follow patient during hospitalization--  Wyn Quaker RN, MSN, CDE Diabetes Coordinator Inpatient Glycemic Control Team Team Pager: [phone] (8a-5p)

## 2019-11-25 NOTE — Evaluation (Signed)
Physical Therapy Evaluation Patient Details Name: Morgan [name] MRN: [id number] DOB: [birth year] Today's Date: 11/25/2019   History of Present Illness  Morgan [name] is a 54 y.o. female presenting with nausea and dry heaves. PMH is significant for CAD, DM type II, hypertension, hyperlipidemia, hypothyroidism, bipolar/depression/anxiety, cholecystectomy;  Likely hyperglycemia/DKA secondary to noncompliance with insulin regime  Clinical Impression   Pt admitted with above diagnosis. PTA pt reports independent-mod independence with ADL and functional mobility (reports intermittent use of SPC/walker). Pt currently presenting with general weakness, unsteadiness and ultimately limited due to fluctuating/low BP with activity today. Pt was up in shower with RN present upon arrival to room, reporting some dizziness with exiting shower therefore transitioned to recliner for dressing ADL. Pt completing room level mobility with +2 HHA to recliner. BP monitored while sitting and with additional standing/transfers from recliner (as pt reporting need to have BM). Ultimately BP soft, initially maintaining with SBP in the 90s but with prolonged time being upright while on BSC SBP decreasing, use of +2 for transition initially back to semi-reclined position in recliner from [name] and ultimately back to supine/trendelenberg in bed as BP continued to remain low (see vitals flow sheet for full detail); pt reporting x1 instance of feeling "dazed" with initial stand from [name] but otherwise was mostly still able to maintain conversation with therapists/RN even with low BP. RN in during this time and providing IV bolus once supine in bed. Suspect pt will progress well once her BP is better controlled. Currently recommend follow up [name] to progress Pt currently with functional limitations due to the deficits listed below (see PT Problem List). Pt will benefit from skilled PT to increase their independence and safety with  mobility to allow discharge to the venue listed below.       Follow Up Recommendations Home health PT    Equipment Recommendations  Rolling walker with 5" wheels;3in1 (PT)    Recommendations for Other Services       Precautions / Restrictions Precautions Precautions: Fall Precaution Comments: watch BP - drops with prolonged standing/being upright at time of eval Restrictions Weight Bearing Restrictions: No      Mobility  Bed Mobility Overal bed mobility: Needs Assistance Bed Mobility: Sit to Supine       Sit to supine: Mod assist   General bed mobility comments: assist for LEs onto EOB  Transfers Overall transfer level: Needs assistance Equipment used: 1 person hand held assist;2 person hand held assist Transfers: Sit to/from [facility] Sit to Stand: Min guard;Min assist Stand pivot transfers: Min assist;+2 physical assistance       General transfer comment: fluctuating levels of assist given continued dizziness and with BP continuing to fluctuate/drop with activity today. pt able to stand without assist but requiring at least minA for static standing. MinA for stand pivot to [name] but use of +2 assist for transfer BSC>recliner>EOB for added safety and given dizziness/BP status (see vitals section)   Ambulation/Gait                Stairs            Wheelchair Mobility    Modified Rankin (Stroke Patients Only)       Balance Overall balance assessment: Needs assistance Sitting-balance support: Feet supported Sitting balance-Leahy Scale: Fair     Standing balance support: Single extremity supported;Bilateral upper extremity supported Standing balance-Leahy Scale: Fair Standing balance comment: improved with at least single UE support  Pertinent Vitals/Pain Pain Assessment: No/denies pain    Home Living Family/patient expects to be discharged to:: Private residence Living Arrangements:  Children Available Help at Discharge: Family;Available PRN/intermittently Type of Home: House Home Access: Stairs to enter Entrance Stairs-Rails: None Entrance Stairs-Number of Steps: 3 Home Layout: One level Home Equipment: Cane - single point;Walker - 2 wheels;Bedside commode;Shower seat Additional Comments: uses assistive devices intermittently    Prior Function Level of Independence: Independent;Independent with assistive device(s)         Comments: reports intermittent use of SPC/walker. Working from home for [employer]   Dominant Hand: Right    Extremity/Trunk Assessment   Upper Extremity Assessment Upper Extremity Assessment: Defer to OT evaluation    Lower Extremity Assessment Lower Extremity Assessment: Generalized weakness       Communication   Communication: No difficulties  Cognition Arousal/Alertness: Awake/alert Behavior During Therapy: WFL for tasks assessed/performed;Flat affect Overall Cognitive Status: Impaired/Different from baseline Area of Impairment: Orientation                 Orientation Level: Disoriented to;Time             General Comments: fluctutating cognition (but suspect moreso due to drop in BP), pt able to answer homesetup/PLOF questions which appear accurate       General Comments      Exercises     Assessment/Plan    PT Assessment Patient needs continued PT services  PT Problem List Decreased strength;Decreased activity tolerance;Decreased balance;Decreased mobility;Decreased knowledge of use of DME;Decreased safety awareness;Cardiopulmonary status limiting activity       PT Treatment Interventions DME instruction;Gait training;Stair training;Functional mobility training;Therapeutic activities;Therapeutic exercise;Balance training;Patient/family education    PT Goals (Current goals can be found in the Care Plan section)  Acute Rehab PT Goals Patient Stated Goal: return home  PT Goal  Formulation: With patient Time For Goal Achievement: 12/09/19 Potential to Achieve Goals: Good    Frequency Min 3X/week   Barriers to discharge        Co-evaluation PT/OT/SLP Co-Evaluation/Treatment: Yes Reason for Co-Treatment: For patient/therapist safety;Complexity of the patient's impairments (multi-system involvement);To address functional/ADL transfers PT goals addressed during session: Mobility/safety with mobility OT goals addressed during session: ADL's and self-care       AM-PAC PT "6 Clicks" Mobility  Outcome Measure Help needed turning from your back to your side while in a flat bed without using bedrails?: None Help needed moving from lying on your back to sitting on the side of a flat bed without using bedrails?: A Little Help needed moving to and from a bed to a chair (including a wheelchair)?: A Little Help needed standing up from a chair using your arms (e.g., wheelchair or bedside chair)?: A Little Help needed to walk in hospital room?: A Little Help needed climbing 3-5 steps with a railing? : A Little 6 Click Score: 19    End of Session   Activity Tolerance: Other (comment)(limited by low BPs) Patient left: in bed;with call bell/phone within reach;with nursing/sitter in room Nurse Communication: Mobility status;Other (comment)(low BPs) PT Visit Diagnosis: Unsteadiness on feet (R26.81);Other abnormalities of gait and mobility (R26.89)    Time: [id number] PT Time Calculation (min) (ACUTE ONLY): 39 min   Charges:   PT Evaluation $PT Eval Moderate Complexity: 1 Mod PT Treatments $Therapeutic Activity: 8-22 mins        Roney Marion, PT  Acute Rehabilitation Services Pager [phone] Office [phone]   Colletta [place] 11/25/2019, 5:57  PM   

## 2019-11-25 NOTE — Progress Notes (Signed)
FPTS interim progress note:   Late documentation, evaluated patient at approximately 1630.   S: RN noted systolic BP to be in the [id number] on both arms.  Patient feeling generally tired.  Evaluated patient at bedside.  States she is feeling fatigued, which she has been for the past several days, but denies any lightheadedness/dizziness, chest pain, shortness of breath, visual changes, focal weakness.  Reports mild headache.  One episode of diarrhea, NB. Did receive her Lasix 20 mg, losartan 100 mg, and metoprolol tartrate 50 mg this morning.  Patient reports she does take these medications at home.  Ate some Jell-O, however most food still making her feel nauseous.  O: Blood pressure (!) 80/44, pulse 65, temperature 98.3 F (36.8 C), resp. rate 16, height 5\' 3"  (1.6 m), weight 90.1 kg, last menstrual period 08/03/2013, SpO2 93 %.  General: No acute distress, laying in mild Trendelenburg Lungs: Unlabored breathing Neuro: Alert and oriented, follows commands appropriately, EOMI without nystagmus, speech normal with symmetrical smile.  No focal neuro deficits noted, moving all extremities spontaneously with 5/5 strength. Psych: Flat mood and affect  A/P: Hypotension, suspect multifactorial in the setting of dehydration with overmedication. MAP upper 50's.  Alert with nonfocal exam.  Will discontinue p.m. Lasix dose and substantially decrease home metoprolol.  Will give 1L NS bolus now and recheck BP afterwards.  Patriciaann Clan, DO

## 2019-11-25 NOTE — Progress Notes (Signed)
CALL PAGER [phone] for any questions or notifications regarding this patient  FMTS Attending Note: Dorcas Mcmurray MD She had episode of hypotension with systolics in the [id number] to 0000000.  We will adjust some of her blood pressure medicines.  This is a little concerning given her potential for left lower lobe pneumonia.  I reviewed the chest x-ray and CT images again.  We will start her back on antibiotic therapy.  I will choose Levaquin as she is penicillin allergic with anaphylaxis to cephalosporins.  I think it will be easier on her stomach than doxycycline on.  I would like to get blood cultures before she gets her first dose of this and that has been ordered.  We will also adjust her blood pressure regimen.

## 2019-11-25 NOTE — Progress Notes (Signed)
Family Medicine Teaching Service Daily Progress Note Intern Pager: [phone]  Patient name: Morgan [name] Medical record number: [id number] Date of birth: 1966/01/13 Age: 54 y.o. Gender: female  Primary Care Provider: Shirley, [place], DO Consultants: None  Code Status: Intubation only   Pt Overview and Major Events to Date:  4/24: Admitted   Assessment and Plan: REDONNA [name] is a 54 y.o. female presenting with nausea and dry heaves, found to be in DKA. PMH is significant for CAD, DM type II, hypertension, hyperlipidemia, hypothyroidism, bipolar/depression/anxiety, cholecystectomy.  DKA, in setting of poorly controlled insulin-dependent DM: Improving.  Secondary to noncompliance. BHB 4.5. Transitioned off of insulin drip 4/24 afternoon d/t lab difficulties, recent AG 16.  CBGs 200-300s.  -Increase Lantus to 25 U BID  -Add 5 U NovoLog with meals -sSSI Ac/qhs  -Follow-up 1400 BMP -Diabetic education consulted, appreciate assistance  Insulin-dependent DM: Uncontrolled. A1c 14.5%.  30+ year diagnosis.  Noncompliance recently due to restarting her job 2-3 weeks ago, however ?compliance before as well given A1c.  Reports numerous symptomatic nocturnal hypoglycemic episodes (glucose 40-60s).  Failure of several oral agents in the past. -Acute DKA management as above  -Had long discussion on compliance  -Should send home on decreased Lantus due to hypoglycemic episodes -Consider GLP-1 agonist outpatient with concurrent CVD  Leukocytosis: Improving.  WBC 18>13.8 this am, additionally all cell lines decreased following fluid resuscitation.  Largely suspect hemoconcentration.  While CT chest showing LLL showing LLL infectious opacity vs atelectasis, without any concerning symptomatology suggestive of pneumonia we will continue to hold off antibiotics for now. -Monitor CBC -Monitor vitals, respiratory changes  Recurrent hypokalemia, in setting of DKA:  K 2.8 this a.m.  -40 meq K IV + 40  meq kdur BID  -Follow-up 1400 BMP  CAD, s/p PCI in 10/2018: Chronic, stable. No current chest pains. -Continue ASA and Brilinta -Continue atorvastatin 80 mg  Left subclavian stenosis s/p bypass in 07/2019: Stable. Performed by Dr. Carlis Abbott. -Continue ASA, Brilinta, and atorvastatin as discussed above  Bipolar  Depression  Anxiety: Chronic, stable. Reports feeling "miserable" but not depressed.  No SI or HI.  Has not been able to reestablish with her psychiatrist.  Has not taken her medications in several weeks due to no refills. -Restart home fluoxetine 60 mg, Latuda 60 mg, and Lamictal 200 mg -Provide community/psychiatry resources on AVS at discharge  GERD  Chronic esophagogastric ulcer  Nausea: Chronic, stable. Has been noncompliant with Protonix at home. -Continue home Protonix 40 mg BID -Monitor for symptoms as DKA resolves  Hypertension: Chronic, stable. Normotensive.  -Continue home Lasix 20 BID, metoprolol 50 mg BID, and losartan 100 mg daily -Monitor BP  Hyperlipidemia: Chronic, stable. Secondary prevention. -Continue atorvastatin 80 mg  Hypothyroidism: Chronic, stable. -Continue home Synthroid 150 mcg  Migraines: Chronic, stable.  Takes topomax at home for prophylactic therapy.  No current headaches.  Will hold Topamax for now given known AE for metabolic acidosis.  Asthma: Chronic, stable. Associated with chronic cough, no respiratory distress. -Continue home Advair BID (Dulera substituted while inpatient) -Albuterol as needed  FEN/GI:  -Heart healthy/carb modified -IV normal saline at 169mL/hr -Midline for blood draw  Prophylaxis:  -Lovenox  Disposition:  Continued medical optimization, likely discharge home tomorrow 4/26 or 4/27  Subjective:  No acute events overnight.  Taken off of drip yesterday due to lab difficulties in place to midline.  Feels like her nausea is a little bit better, has not vomited today.  Eating a little bit, however  makes her belly feel little upset.  States she just recently got back to work the last 2-3 weeks and due to the change in her regimen has not been taking her insulin.  Reports complete compliance prior to these 2-3 weeks while she was out due to her recent subclavian bypass in December 2020. Had lengthy discussion about taking her insulin daily.  Objective: Temp:  [98 F (36.7 C)-99.2 F (37.3 C)] 98.8 F (37.1 C) (04/25 0439) Pulse Rate:  [72-113] 72 (04/25 0439) Resp:  [16-23] 16 (04/25 0439) BP: (88-169)/(57-99) 96/82 (04/25 0439) SpO2:  [91 %-99 %] 94 % (04/25 0439) Weight:  [90.1 kg] 90.1 kg (04/24 2053) Physical Exam: General: NAD, laying comfortably HEENT: NCAT, MM dry  Cardiac: RRR  Lungs: Clear bilaterally, no increased WOB  Abdomen: soft, nondistended, nontender to light palpation throughout Msk: Moves all extremities spontaneously  Ext: Warm, dry Psych: Flat mood and affect, participates in conversation appropriately, no SI or HI.  Asks "will I get better "  Laboratory: Recent Labs  Lab 11/24/19 0026 11/24/19 1023 11/25/19 0427  WBC 18.1*  --  13.8*  HGB 16.2* 16.7* 12.5  HCT 51.9* 49.0* 40.9  PLT 403*  --  279   Recent Labs  Lab 11/24/19 0026 11/24/19 1023 11/24/19 1720 11/24/19 2228 11/25/19 0427  NA 130*   < > 131* 132* 135  K 3.3*   < > 3.8 3.7 2.8*  CL 91*   < > 95* 97* 100  CO2 20*   < > 19* 17* 19*  BUN 20   < > 17 15 12   CREATININE 1.09*   < > 0.94 0.89 1.03*  CALCIUM 9.5   < > 9.0 8.5* 8.0*  PROT 7.0  --   --   --  4.8*  BILITOT 1.3*  --   --   --  2.1*  ALKPHOS 87  --   --   --  63  ALT 34  --   --   --  24  AST 39  --   --   --  22  GLUCOSE 328*   < > 245* 325* 313*   < > = values in this interval not displayed.     Morgan Clan, DO 11/25/2019, 8:06 AM PGY-2, [place] Intern pager: [phone], text pages welcome

## 2019-11-26 DIAGNOSIS — J189 Pneumonia, unspecified organism: Secondary | ICD-10-CM

## 2019-11-26 DIAGNOSIS — R0602 Shortness of breath: Secondary | ICD-10-CM

## 2019-11-26 DIAGNOSIS — R739 Hyperglycemia, unspecified: Secondary | ICD-10-CM

## 2019-11-26 LAB — CBC
HCT: 40.9 % (ref 36.0–46.0)
Hemoglobin: 12.5 g/dL (ref 12.0–15.0)
MCH: 19.3 pg — ABNORMAL LOW (ref 26.0–34.0)
MCHC: 30.6 g/dL (ref 30.0–36.0)
MCV: 63.1 fL — ABNORMAL LOW (ref 80.0–100.0)
Platelets: 279 10*3/uL (ref 150–400)
RBC: 6.48 MIL/uL — ABNORMAL HIGH (ref 3.87–5.11)
RDW: 19.8 % — ABNORMAL HIGH (ref 11.5–15.5)
WBC: 13.8 10*3/uL — ABNORMAL HIGH (ref 4.0–10.5)
nRBC: 0 % (ref 0.0–0.2)

## 2019-11-26 LAB — CBC WITH DIFFERENTIAL/PLATELET
Abs Immature Granulocytes: 0.05 10*3/uL (ref 0.00–0.07)
Basophils Absolute: 0.1 10*3/uL (ref 0.0–0.1)
Basophils Relative: 1 %
Eosinophils Absolute: 0.1 10*3/uL (ref 0.0–0.5)
Eosinophils Relative: 1 %
HCT: 36.3 % (ref 36.0–46.0)
Hemoglobin: 11.2 g/dL — ABNORMAL LOW (ref 12.0–15.0)
Immature Granulocytes: 1 %
Lymphocytes Relative: 34 %
Lymphs Abs: 3.6 10*3/uL (ref 0.7–4.0)
MCH: 19.3 pg — ABNORMAL LOW (ref 26.0–34.0)
MCHC: 30.9 g/dL (ref 30.0–36.0)
MCV: 62.7 fL — ABNORMAL LOW (ref 80.0–100.0)
Monocytes Absolute: 0.9 10*3/uL (ref 0.1–1.0)
Monocytes Relative: 8 %
Neutro Abs: 6 10*3/uL (ref 1.7–7.7)
Neutrophils Relative %: 55 %
Platelets: 212 10*3/uL (ref 150–400)
RBC: 5.79 MIL/uL — ABNORMAL HIGH (ref 3.87–5.11)
RDW: 19.3 % — ABNORMAL HIGH (ref 11.5–15.5)
WBC: 10.8 10*3/uL — ABNORMAL HIGH (ref 4.0–10.5)
nRBC: 0 % (ref 0.0–0.2)

## 2019-11-26 LAB — BASIC METABOLIC PANEL
Anion gap: 12 (ref 5–15)
BUN: 7 mg/dL (ref 6–20)
CO2: 22 mmol/L (ref 22–32)
Calcium: 8.2 mg/dL — ABNORMAL LOW (ref 8.9–10.3)
Chloride: 107 mmol/L (ref 98–111)
Creatinine, Ser: 0.75 mg/dL (ref 0.44–1.00)
GFR calc Af Amer: >60 mL/min (ref >60)
GFR calc non Af Amer: >60 mL/min (ref >60)
Glucose, Bld: 123 mg/dL — ABNORMAL HIGH (ref 70–99)
Potassium: 3.7 mmol/L (ref 3.5–5.1)
Sodium: 141 mmol/L (ref 135–145)

## 2019-11-26 LAB — GLUCOSE, CAPILLARY
Glucose-Capillary: 141 mg/dL — ABNORMAL HIGH (ref 70–99)
Glucose-Capillary: 149 mg/dL — ABNORMAL HIGH (ref 70–99)
Glucose-Capillary: 191 mg/dL — ABNORMAL HIGH (ref 70–99)
Glucose-Capillary: 182 mg/dL — ABNORMAL HIGH (ref 70–99)

## 2019-11-26 LAB — MAGNESIUM: Magnesium: 1.2 mg/dL — ABNORMAL LOW (ref 1.7–2.4)

## 2019-11-26 MED ORDER — PRO-STAT SUGAR FREE PO LIQD
30.0000 mL | Freq: Two times a day (BID) | ORAL | Status: DC
  Administered 2019-11-26 – 2019-11-27 (×2): 30 mL via ORAL
  Filled 2019-11-26 (×2): qty 30

## 2019-11-26 MED ORDER — GLUCERNA SHAKE PO LIQD
237.0000 mL | Freq: Three times a day (TID) | ORAL | Status: DC
  Administered 2019-11-26 – 2019-11-27 (×4): 237 mL via ORAL

## 2019-11-26 MED ORDER — INSULIN GLARGINE 100 UNIT/ML ~~LOC~~ SOLN
25.0000 [IU] | Freq: Every day | SUBCUTANEOUS | Status: DC
  Administered 2019-11-26: 25 [IU] via SUBCUTANEOUS
  Filled 2019-11-26: qty 0.25

## 2019-11-26 MED ORDER — INSULIN GLARGINE 100 UNIT/ML ~~LOC~~ SOLN
30.0000 [IU] | Freq: Every morning | SUBCUTANEOUS | Status: DC
  Filled 2019-11-26: qty 0.3

## 2019-11-26 MED ORDER — MAGNESIUM SULFATE 4 GM/100ML IV SOLN
4.0000 g | Freq: Once | INTRAVENOUS | Status: AC
  Administered 2019-11-26: 4 g via INTRAVENOUS
  Filled 2019-11-26: qty 100

## 2019-11-26 MED ORDER — INSULIN GLARGINE 100 UNIT/ML ~~LOC~~ SOLN
30.0000 [IU] | Freq: Every day | SUBCUTANEOUS | Status: DC
  Administered 2019-11-27: 30 [IU] via SUBCUTANEOUS
  Filled 2019-11-26: qty 0.3

## 2019-11-26 MED ORDER — INSULIN GLARGINE 100 UNIT/ML ~~LOC~~ SOLN
25.0000 [IU] | Freq: Every day | SUBCUTANEOUS | Status: DC
  Filled 2019-11-26: qty 0.25

## 2019-11-26 MED ORDER — ONDANSETRON HCL 4 MG/2ML IJ SOLN
4.0000 mg | Freq: Four times a day (QID) | INTRAMUSCULAR | Status: DC
  Administered 2019-11-26: 4 mg via INTRAVENOUS
  Filled 2019-11-26: qty 2

## 2019-11-26 MED ORDER — INSULIN GLARGINE 100 UNIT/ML ~~LOC~~ SOLN: 25.0000 [IU] | Freq: Two times a day (BID) | SUBCUTANEOUS | Status: DC

## 2019-11-26 MED ORDER — POTASSIUM CHLORIDE CRYS ER 20 MEQ PO TBCR
40.0000 meq | EXTENDED_RELEASE_TABLET | Freq: Once | ORAL | Status: AC
  Administered 2019-11-26: 40 meq via ORAL
  Filled 2019-11-26: qty 2

## 2019-11-26 MED ORDER — ONDANSETRON HCL 4 MG/2ML IJ SOLN
4.0000 mg | Freq: Once | INTRAMUSCULAR | Status: AC
  Administered 2019-11-26: 4 mg via INTRAVENOUS
  Filled 2019-11-26: qty 2

## 2019-11-26 NOTE — Progress Notes (Signed)
Family Medicine Teaching Service Daily Progress Note Intern Pager: [phone]  Patient name: Morgan [name] Medical record number: [id number] Date of birth: [birth year] Age: 54 y.o. Gender: female  Primary Care Provider: Shirley, [place], DO Consultants: None  Code Status: Intubation only   Pt Overview and Major Events to Date:  4/24: Admitted   Assessment and Plan: Morgan [name] is a 54 y.o. female presenting with nausea and dry heaves, found to be in DKA. PMH is significant for CAD, DM type II, hypertension, hyperlipidemia, hypothyroidism, bipolar/depression/anxiety, cholecystectomy.  DKA, in setting of poorly controlled insulin-dependent DM: Improving.  Secondary to noncompliance which may have a psychiatric component with her depressed state. CBGs 120-140s. Continues to endorse nausea -Increase Lantus to 25 U BID  -Add 5 U NovoLog with meals -sSSI Ac/qhs  -Diabetic education consulted, appreciate assistance -Zofran 8mg  ODT x1; schedule now   Insulin-dependent DM: Uncontrolled. A1c 14.5%.  30+ year diagnosis.  Noncompliance recently due to restarting her job 2-3 weeks ago, however ?compliance before as well given A1c.  Reports numerous symptomatic nocturnal hypoglycemic episodes (glucose 40-60s).  Failure of several oral agents in the past. -Consider GLP-1 agonist outpatient with concurrent CVD  Leukocytosis: Improving. C/F LLL CAP WBC 18>13.8 this am, additionally all cell lines decreased following fluid resuscitation.  Largely suspect hemoconcentration.  While CT chest showing LLL showing LLL infectious opacity vs atelectasis, with hypotension abx started yesterday  -Continue levaquin -Monitor CBC -Monitor vitals, respiratory changes  Recurrent hypokalemia, in setting of DKA:  K 3.7 this a.m. Mag 1.2 -Monitor on AM BMP -Replete PRN  CAD, s/p PCI in 10/2018: Chronic, stable. No current chest pains. -Continue ASA and Brilinta -Continue atorvastatin 80 mg  Left subclavian  stenosis s/p bypass in 07/2019: Stable. Performed by Dr. Carlis Abbott. -Continue ASA, Brilinta, and atorvastatin as discussed above  Bipolar  Depression  Anxiety: Chronic, stable. Reports feeling "miserable" but not depressed.  No SI or HI.  Has not been able to reestablish with her psychiatrist.  Has not taken her medications in several weeks due to no refills. -Restart home fluoxetine 60 mg, Latuda 60 mg, and Lamictal 200 mg -Provide community/psychiatry resources on AVS at discharge  GERD  Chronic esophagogastric ulcer  Nausea: Chronic, stable. Has been noncompliant with Protonix at home. -Continue home Protonix 40 mg BID -Monitor for symptoms as DKA resolves  Hypertension: Chronic, stable. BP 90/58. -continue metoprolol 12.5 mg BID  -Discontinue home Lasix 20 BID and losartan 100 mg daily -Monitor BP  Hyperlipidemia: Chronic, stable. Secondary prevention. -Continue atorvastatin 80 mg  Hypothyroidism: Chronic, stable. -Continue home Synthroid 150 mcg  Migraines: Chronic, stable.  Takes topomax at home for prophylactic therapy.  No current headaches.  Will hold Topamax for now given known AE for metabolic acidosis.  Asthma: Chronic, stable. Associated with chronic cough, no respiratory distress. -Continue home Advair BID (Dulera substituted while inpatient) -Albuterol as needed  Low back pain Endorses intermittent low back pain when she is dry heaving. -Tylenol -Heating pad; can try K-PAD -Lidocaine patch  FEN/GI:  -Heart healthy/carb modified -IV normal saline at 129mL/hr -Midline for blood draw  Prophylaxis:  -Lovenox  Disposition:  Continued medical optimization, likely discharge home 4/26 or 4/27  Subjective:  Endorses back pain when dry heaving from nausea.   Objective: Temp:  [98 F (36.7 C)-98.9 F (37.2 C)] 98.5 F (36.9 C) (04/26 0518) Pulse Rate:  [62-86] 74 (04/26 0519) Resp:  [16-18] 18 (04/26 0518) BP: (71-147)/(44-74) 90/58 (04/26  0519) SpO2:  [93 %-  97 %] 94 % (04/26 0518) Physical Exam:  General: Appears unwell, no acute distress. Age appropriate.  Cardiac: RRR, normal heart sounds, no murmurs Respiratory: CTAB, normal effort Extremities: No edema or cyanosis. Skin: Warm and dry, no rashes noted Neuro: alert and oriented, no focal deficits Psych: flat affect  Laboratory: Recent Labs  Lab 11/24/19 0026 11/24/19 1023 11/25/19 0427  WBC 18.1*  --  13.8*  HGB 16.2* 16.7* 12.5  HCT 51.9* 49.0* 40.9  PLT 403*  --  279   Recent Labs  Lab 11/24/19 0026 11/24/19 1023 11/25/19 0427 11/25/19 1349 11/26/19 0513  NA 130*   < > 135 133* 141  K 3.3*   < > 2.8* 3.9 3.7  CL 91*   < > 100 100 107  CO2 20*   < > 19* 22 22  BUN 20   < > 12 9 7   CREATININE 1.09*   < > 1.03* 0.87 0.75  CALCIUM 9.5   < > 8.0* 8.0* 8.2*  PROT 7.0  --  4.8*  --   --   BILITOT 1.3*  --  2.1*  --   --   ALKPHOS 87  --  63  --   --   ALT 34  --  24  --   --   AST 39  --  22  --   --   GLUCOSE 328*   < > 313* 393* 123*   < > = values in this interval not displayed.    Gerlene Fee, DO 11/26/2019, 8:21 AM PGY-1, [place] Intern pager: [phone], text pages welcome

## 2019-11-26 NOTE — Plan of Care (Signed)
  Problem: Education: Goal: Knowledge of General Education information will improve Description Including pain rating scale, medication(s)/side effects and non-pharmacologic comfort measures Outcome: Progressing   

## 2019-11-26 NOTE — Progress Notes (Signed)
Inpatient Diabetes Program Recommendations  AACE/ADA: New Consensus Statement on Inpatient Glycemic Control   Target Ranges:  Prepandial:   less than 140 mg/dL      Peak postprandial:   less than 180 mg/dL (1-2 hours)      Critically ill patients:  140 - 180 mg/dL   Results for TALAH, SORSBY" (MRN [id number]) as of 11/26/2019 10:00  Ref. Range 11/25/2019 06:46 11/25/2019 11:21 11/25/2019 16:21 11/25/2019 21:26 11/26/2019 06:46  Glucose-Capillary Latest Ref Range: 70 - 99 mg/dL 294 (H) 285 (H) 382 (H) 218 (H) 141 (H)   Review of Glycemic Control  Admit with: DKA/ Hyperglycemia/ DM Type II--pt told MD she hs not been taking insulin for about 2 weeks secondary to change in home routine  History: DM2  Home DM Meds: Lantus 42 units BID, Humalog 25 units TID  Current Orders: Lantus 25 units BID, Novolog 0-15 units TID with meals, Novolog 0-5 units QHS, Novolog 5 units TID with meals  Inpatient Diabetes Program Recommendations:    Insulin-Basal: Please consider increasing morning dose of Lantus to 30 units QAM and continue Lantus 25 units QHS. If MD agreeable, please order one time Lantus 5 units x1 now for total of 30 units this morning (since patient has already received 25 units this morning).  Insulin-Meal Coverage: Noted meal coverage was only given with lunch on 11/25/19 and patient is only eating about 25% of meals.  HbgA1C: A1C 14.5% on 11/24/19 indicating an average glucose of 369 mg/dl over the past 2-3 months.  NOTE: Noted consult for Diabetes Coordinator. Diabets Coordinator working remotely today.  Chart reviewed. Noted patient was seen by Inpatient Diabetes Coordinator on 07/31/19 during prior admission. Patient seen PCP on 08/14/19 and per office note, patient had not been taking insulin due to numbness and tingling in her hands and patient was advised to go to Emergency Room due to hyperglycemia. Patient went to Emergency Room on 08/15/19 and hyperglycemia was treated  and per Dr. Sanjuana Kava note on 08/15/19 patient was observed successively giving herself insulin while still in the Emergency Room. Patient admitted on 11/24/19 with DKA with initial glucose of 328 mg/dl on 11/24/19. Per H&P, patient reported that she has not taken insulin in 2 weeks due to a change in home routine. Spoke with patient over the phone about diabetes and home regimen for diabetes control. Patient reports that she is prescribed Lantus 42 units BID and Humalog 25 units TID with meals as an outpatient for diabetes control.  Patient reports that her normal routine has changed recently when she restarted her job. Patient states that she took some insulin over the past 2 weeks but she was not taking anything consistently. Patient states that she is able to give herself her insulin without any issues with her hands.  Patient states that she checks glucose at home but when asked what her glucose trends have been over the past couple of weeks, she stated "I don't remember."  Inquired about any issues with hypoglycemia and patient denies any issues with hypoglycemia and states that she has not had any hypoglycemia in a very long time.  Discussed A1C of 14.5% on 11/24/19 and explained that current A1C indicates an average glucose of 369 mg/dl. Discussed A1C and glucose goals.  Discussed importance of checking CBGs and maintaining good CBG control to prevent long-term and short-term complications. Explained how hyperglycemia leads to damage within blood vessels which lead to the common complications seen with uncontrolled diabetes. Stressed to  the patient the importance of improving glycemic control to prevent further complications from uncontrolled diabetes. Patient reports that her work schedule has changed over past 2 weeks but this past Saturday she started working 10pm to 7am. Discussed taking Lantus 12 hours apart and Humalog with meals.  Inquired about ways she could remember to take insulin consistently and  patient stated that she plans to start taking her insulin consistently. She then stated "I am getting very frustrated with you. You are asking the same questions I have already talked about and you are telling me things I already know. I don't feel good and you woke me up from sleeping." I apologized that I was making her frustrated, but I wanted to be sure she understood importance of DM control to decrease her risk of further issues related to poor DM control. Patient hung up the phone while I was finishing up talking with her.   Thanks, Barnie Alderman, RN, MSN, CDE Diabetes Coordinator Inpatient Diabetes Program [phone] (Team Pager from 8am to 5pm)

## 2019-11-26 NOTE — TOC Initial Note (Signed)
Transition of Care [facility]) - Initial/Assessment Note    Patient Details  Name: Morgan [name] MRN: [id number] Date of Birth: 04-Feb-1966  [facility] CM/SW Contact:    Pollie Friar, RN Phone Number: 11/26/2019, 4:11 PM  Clinical Narrative:                 Pt lives at home alone with her adult son that sleeps in the day time and isnt available to provide needed assistance. Pt is agreeable to a SNF stay for rehab to get stronger before returning home. CSW updated. Pt agreeable to being faxed out in the [facility] area. TOC will provide bed offers once available. TOC following.  Expected Discharge Plan: Skilled Nursing Facility Barriers to Discharge: Continued Medical Work up   Patient Goals and CMS Choice   CMS Medicare.gov Compare Post Acute Care list provided to:: Patient Choice offered to / list presented to : Patient  Expected Discharge Plan and Services Expected Discharge Plan: [place] In-house Referral: Clinical Social Work Discharge Planning Services: CM Consult Post Acute Care Choice: [place] arrangements for the past 2 months: [place]                                      Prior Living Arrangements/Services Living arrangements for the past 2 months: Single Family Home Lives with:: Adult Children Patient language and need for interpreter reviewed:: Yes Do you feel safe going back to the place where you live?: Yes      Need for Family Participation in Patient Care: Yes (Comment) Care giver support system in place?: No (comment)(Son sleeps during the day and doesnt help her at home) Current home services: DME(walker/ 3 in 1) Criminal Activity/Legal Involvement Pertinent to Current Situation/Hospitalization: No - Comment as needed  Activities of Daily Living Home Assistive Devices/Equipment: Cane (specify quad or straight), Walker (specify type) ADL Screening (condition at time of  admission) Patient's cognitive ability adequate to safely complete daily activities?: Yes Is the patient deaf or have difficulty hearing?: No Does the patient have difficulty seeing, even when wearing glasses/contacts?: No Does the patient have difficulty concentrating, remembering, or making decisions?: No Patient able to express need for assistance with ADLs?: Yes Does the patient have difficulty dressing or bathing?: No Independently performs ADLs?: Yes (appropriate for developmental age) Does the patient have difficulty walking or climbing stairs?: Yes Weakness of Legs: Both Weakness of Arms/Hands: Both  Permission Sought/Granted                  Emotional Assessment Appearance:: Appears stated age   Affect (typically observed): Blunt, Flat Orientation: : Oriented to Self, Oriented to Place, Oriented to  Time, Oriented to Situation   Psych Involvement: No (comment)  Admission diagnosis:  SOB (shortness of breath) [R06.02] Hyperglycemia [R73.9] Diabetic ketoacidosis without coma associated with type 2 diabetes mellitus (HCC) [E11.10] Pneumonia of left lower lobe due to infectious organism [J18.9] Patient Active Problem List   Diagnosis Date Noted  . Pneumonia of left lower lobe due to infectious organism   . SOB (shortness of breath)   . Hyperglycemia 11/24/2019  . Dyspnea on exertion 10/25/2019  . Cervical spinal stenosis 10/09/2019  . Carpal tunnel syndrome, bilateral 09/07/2019  . Status post carotid endarterectomy 07/30/2019  . Subclavian artery stenosis, left ([place]) 07/30/2019  . 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 Migraines 07/16/2019  .  Stenosis of left subclavian artery ([place]) 06/05/2019  . Vitamin D deficiency 05/19/2019  . Chronic nonintractable headache   . Type 2 diabetes mellitus with hyperglycemia, with long-term current use of insulin ([place]) 11/17/2018  . S/P coronary artery stent placement   . Acute systolic heart failure ([place])   . Hyperlipidemia  LDL goal <70   . Esophagitis determined by endoscopy   . Hepatic steatosis   . Elevated transaminase level 07/21/2018  . Mild renal insufficiency 07/21/2018  . Bipolar depression ([place]) 07/21/2018  . Incidental lung nodule 07/21/2018  . Extravasation accident, initial encounter 07/21/2018  . Prolonged QT interval 07/21/2018  . Hyperlipidemia 05/14/2016  . Hypothyroidism 05/14/2016  . Essential hypertension 05/14/2016   PCP:  Shirley, [place], DO Pharmacy:   [place], [place] [facility] [address] [place] [address] [place] [address] Phone: [phone] Fax: [place], [place] [address] Phone: [phone] Fax: [phone]     Social Determinants of Health (SDOH) Interventions    Readmission Risk Interventions Readmission Risk Prevention Plan 10/29/2018  Transportation Screening Complete  PCP or Specialist Appt within 3-5 Days Complete  HRI or [place] Complete  Social Work Consult for [place] Planning/Counseling Complete  Palliative Care Screening Not Applicable  Medication Review Press photographer) Complete  Some recent data might be hidden

## 2019-11-26 NOTE — Progress Notes (Signed)
Initial Nutrition Assessment  DOCUMENTATION CODES:   Obesity unspecified  INTERVENTION:   - Glucerna Shake po TID, each supplement provides 220 kcal and 10 grams of protein  - Pro-stat 30 ml po BID, each supplement provides 100 kcal and 15 grams of protein  NUTRITION DIAGNOSIS:   Inadequate oral intake related to nausea as evidenced by per patient/family report.  GOAL:   Patient will meet greater than or equal to 90% of their needs  MONITOR:   PO intake, Supplement acceptance, Labs, Weight trends, I & O's  REASON FOR ASSESSMENT:   Malnutrition Screening Tool    ASSESSMENT:   54 year old female who presented on 4/24 with nausea. PMH of T2DM, CAD, HTN, HLD, hypothyroidism, bipolar, depression, anxiety, cholecystectomy. Pt admitted with DKA.   Noted pt reports not taking insulin for about 2 weeks secondary to change in home routine.  Spoke with pt at bedside. Pt reports feeling very sick and was not open to answering RD questions other than reporting that she is still experiencing nausea, has noticed she has lost weight, and is not sure why she has lost weight. Pt states, "do we really have to do this right now?" when RD attempted to obtain additional diet and weight history. RD explained role and purpose of interview but pt declined to answer further questions.  Pt currently on a Heart Healthy/Carb Modified diet with variable PO intake. Pt willing to try an oral nutrition supplement during admission.  Reviewed weight history in chart. Pt with weight fluctuations between 90-111 kg over the last year. However, pt has experienced a weight loss of 13 kg over the last 1 month. This is a 12.6% weight loss which is severe and significant for timeframe. If accurate, pt is at risk for malnutrition. RD unable to obtain more information from patient at this time.  Meal Completion: 25-100%  Medications reviewed and include: SSI, Novolog 5 units TID, Lantus 25 units daily, Zofran 4 mg q 6  hours, protonix IVF: NS @ 100 ml/hr  Labs reviewed: magnesium 1.2, hemoglobin A1C 14.5 CBG's: 141-218 x 24 hours  UOP: 2650 ml x 24 hours I/O's: +2.4 L since admit  NUTRITION - FOCUSED PHYSICAL EXAM:  Unable to complete. Pt refused.  Diet Order:   Diet Order            Diet heart healthy/carb modified Room service appropriate? Yes; Fluid consistency: Thin  Diet effective now              EDUCATION NEEDS:   Not appropriate for education at this time  Skin:  Skin Assessment: Reviewed RN Assessment  Last BM:  11/25/19 small type 7  Height:   Ht Readings from Last 1 Encounters:  11/24/19 5\' 3"  (1.6 m)    Weight:   Wt Readings from Last 1 Encounters:  11/24/19 90.1 kg    Ideal Body Weight:  52.3 kg  BMI:  Body mass index is 35.18 kg/m.  Estimated Nutritional Needs:   Kcal:  1800-2000  Protein:  90-105 grams  Fluid:  >/= 1.8 L    Gaynell Face, MS, RD, LDN Inpatient Clinical Dietitian Pager: [phone] Weekend/After Hours: [phone]

## 2019-11-27 DIAGNOSIS — R739 Hyperglycemia, unspecified: Secondary | ICD-10-CM | POA: Diagnosis not present

## 2019-11-27 DIAGNOSIS — E1110 Type 2 diabetes mellitus with ketoacidosis without coma: Secondary | ICD-10-CM | POA: Diagnosis not present

## 2019-11-27 DIAGNOSIS — J189 Pneumonia, unspecified organism: Secondary | ICD-10-CM | POA: Diagnosis not present

## 2019-11-27 LAB — BASIC METABOLIC PANEL
Anion gap: 12 (ref 5–15)
BUN: 5 mg/dL — ABNORMAL LOW (ref 6–20)
CO2: 21 mmol/L — ABNORMAL LOW (ref 22–32)
Calcium: 8.1 mg/dL — ABNORMAL LOW (ref 8.9–10.3)
Chloride: 105 mmol/L (ref 98–111)
Creatinine, Ser: 0.69 mg/dL (ref 0.44–1.00)
GFR calc Af Amer: >60 mL/min (ref >60)
GFR calc non Af Amer: >60 mL/min (ref >60)
Glucose, Bld: 231 mg/dL — ABNORMAL HIGH (ref 70–99)
Potassium: 2.8 mmol/L — ABNORMAL LOW (ref 3.5–5.1)
Sodium: 138 mmol/L (ref 135–145)

## 2019-11-27 LAB — GLUCOSE, CAPILLARY
Glucose-Capillary: 221 mg/dL — ABNORMAL HIGH (ref 70–99)
Glucose-Capillary: 381 mg/dL — ABNORMAL HIGH (ref 70–99)

## 2019-11-27 LAB — MAGNESIUM: Magnesium: 1.7 mg/dL (ref 1.7–2.4)

## 2019-11-27 MED ORDER — MAGNESIUM SULFATE 4 GM/100ML IV SOLN
4.0000 g | Freq: Once | INTRAVENOUS | Status: AC
  Administered 2019-11-27: 4 g via INTRAVENOUS
  Filled 2019-11-27: qty 100

## 2019-11-27 MED ORDER — LEVOFLOXACIN 750 MG PO TABS: 750.0000 mg | ORAL_TABLET | ORAL | 0 refills | Status: DC

## 2019-11-27 MED ORDER — FLUOXETINE HCL 20 MG PO TABS: 60.0000 mg | ORAL_TABLET | Freq: Every day | ORAL | 0 refills | Status: DC

## 2019-11-27 MED ORDER — POTASSIUM CHLORIDE CRYS ER 20 MEQ PO TBCR
40.0000 meq | EXTENDED_RELEASE_TABLET | ORAL | Status: DC
  Administered 2019-11-27 (×2): 40 meq via ORAL
  Filled 2019-11-27 (×2): qty 2

## 2019-11-27 MED ORDER — METOPROLOL TARTRATE 25 MG PO TABS: 12.5000 mg | ORAL_TABLET | Freq: Two times a day (BID) | ORAL | 0 refills | Status: DC

## 2019-11-27 MED ORDER — LAMOTRIGINE 200 MG PO TABS: 200.0000 mg | ORAL_TABLET | Freq: Every day | ORAL | 0 refills | Status: DC

## 2019-11-27 MED ORDER — INSULIN LISPRO (1 UNIT DIAL) 100 UNIT/ML (KWIKPEN): 5.0000 [IU] | PEN_INJECTOR | Freq: Three times a day (TID) | SUBCUTANEOUS | 11 refills | Status: DC

## 2019-11-27 MED ORDER — LANTUS SOLOSTAR 100 UNIT/ML ~~LOC~~ SOPN: PEN_INJECTOR | SUBCUTANEOUS | 11 refills | Status: DC

## 2019-11-27 MED ORDER — LATUDA 60 MG PO TABS: 60.0000 mg | ORAL_TABLET | Freq: Every day | ORAL | 0 refills | Status: DC

## 2019-11-27 NOTE — Hospital Course (Addendum)
Morgan [name] is a 54 y.o. female who presented with nausea and dry heaves. PMH is significant for CAD, DM type II, hypertension, hyperlipidemia, hypothyroidism, bipolar/depression/anxiety, Cholecystectomy.   DKA, in setting of poorly controlled insulin-dependent DM Patient reported not being able to take her insulin for about 2 weeks secondary to change in work routine. Initial labs significant for serum glucose 328, anion gap 19, BHBT 4.90, potassium 3.3. A1c 14.5%. Received 1 L normal saline bolus in ED and was started on insulin drip. Was eventually transferred to Lantus 25 units daily and moderate sliding scale insulin coverage. 5 U NovoLog with meals was also added. Subsequently increased Lantus to 30U am and 25 U qhs the day of discharge. At time of discharge her CBGs averaged 270s.   Community Acquired Pneumonia WBC 18.1 on admission. Patient afebrile. Reported chronic cough. Chest x-ray showed left lower lobe airspace disease, may represent developing pneumonia or atelectasis.  CT chest impressive for left lower lobe consolidative opacities with associated volume loss. Two-view chest x-ray showed moderate left basilar atelectasis, consolidation. RPP negative, Covid negative. She received 1 dose doxycycline 100 mg in ED. Continued on Levaquin for a needed total of 5 days. At time of discharge WBC improved to 10.8 and patient remained afebrile without antipyretics.

## 2019-11-27 NOTE — Progress Notes (Signed)
DISCHARGE NOTE HOME RUNELL KITCHINGS to be discharged Home per MD order. Discussed prescriptions and follow up appointments with the patient. Prescriptions given to patient; medication list explained in detail. Patient verbalized understanding.  Skin clean, dry and intact without evidence of skin break down, no evidence of skin tears noted. IV catheter discontinued intact. Site without signs and symptoms of complications. Dressing and pressure applied. Pt denies pain at the site currently. No complaints noted.  Patient free of lines, drains, and wounds.   An After Visit Summary (AVS) was printed and given to the patient. Patient escorted via wheelchair, and discharged home via private auto.

## 2019-11-27 NOTE — Progress Notes (Signed)
Inpatient Diabetes Program Recommendations  AACE/ADA: New Consensus Statement on Inpatient Glycemic Control (2015)  Target Ranges:  Prepandial:   less than 140 mg/dL      Peak postprandial:   less than 180 mg/dL (1-2 hours)      Critically ill patients:  140 - 180 mg/dL   Lab Results  Component Value Date   GLUCAP 381 (H) 11/27/2019   HGBA1C 14.5 (H) 11/24/2019    Review of Glycemic Control Results for Morgan [name], Morgan [name]" (MRN [id number]) as of 11/27/2019 12:00  Ref. Range 11/26/2019 16:33 11/26/2019 20:42 11/27/2019 06:56 11/27/2019 11:16  Glucose-Capillary Latest Ref Range: 70 - 99 mg/dL 191 (H) 182 (H) 221 (H) 381 (H)   History:DM2  Home DM Meds:Lantus 42 units BID, Humalog 25 units TID  Current Orders:Lantus 25 units QPM, 30 units QAM, Novolog 0-15 units TID with meals, Novolog 0-5 units QHS, Novolog 5 units TID with meals  Inpatient Diabetes Program Recommendations:    Appears intake improving; post prandial increased following nutritional supplement and meal.   Consider the following:   -Increase Lantus to 30 units BID -Increase meal coverage to Novolog 10 units TID (assuming patient is eating >50% of meals).   Thanks, Bronson Curb, MSN, RNC-OB Diabetes Coordinator [phone] (8a-5p)

## 2019-11-27 NOTE — Discharge Instructions (Signed)
Dear Morgan [name],  Thank you for letting us participate in your care.  POST-HOSPITAL & CARE INSTRUCTIONS 1. Continue Lantus 30 units in the morning and 25 units at night with novolog 5 units with meals three times daily. 2. Continue metoprolol 12.5mg  twice daily.  3. Stop talking lasix and losartan until your follow up visit 4. Go to your follow up appointments (listed below)   DOCTOR'S APPOINTMENT   No future appointments.  Follow-up Information    Care, [facility] Follow up.   Specialty: [place] Why: the office will call to schedule therapy visits Contact information: [address] [place] [address] [phone]           Take care and be well!  [facility]  [place],  [address] [phone]

## 2019-11-27 NOTE — Plan of Care (Signed)
  Problem: Activity: Goal: Risk for activity intolerance will decrease Outcome: Progressing   

## 2019-11-27 NOTE — Progress Notes (Signed)
Occupational Therapy Treatment Patient Details Name: Morgan [name] MRN: [id number] DOB: July 12, 1966 Today's Date: 11/27/2019    History of present illness Morgan [name] is a 54 y.o. female presenting with nausea and dry heaves. PMH is significant for CAD, DM type II, hypertension, hyperlipidemia, hypothyroidism, bipolar/depression/anxiety, cholecystectomy;  Likely hyperglycemia/DKA secondary to noncompliance with insulin regime   OT comments  Pt making progress in therapy, demonstrating improved activity tolerance and independence with self-care and functional transfer tasks. Educated pt on safety strategies and energy conservation techniques for ADLs and transfers with fair understanding. Pt able to ambulate to/from bathroom utilizing IV pole for support. 0 instances of LOB. Pt completed toileting task and simulated walk-in shower transfer with min guard and use of grab bars. Pt completed hygiene at the sink. No signs/symptoms of distress. OT will continue to follow acutely.    Follow Up Recommendations  Home health OT;Supervision/Assistance - 24 hour(for the first few days)    Equipment Recommendations  None recommended by OT    Recommendations for Other Services      Precautions / Restrictions Precautions Precautions: Fall Precaution Comments: BP soft, patient denies dizziness, lightheadedness. Restrictions Weight Bearing Restrictions: No       Mobility Bed Mobility Overal bed mobility: Modified Independent Bed Mobility: Supine to Sit     Supine to sit: Modified independent (Device/Increase time);HOB elevated     General bed mobility comments: use of bed rails  Transfers Overall transfer level: Needs assistance Equipment used: None Transfers: Sit to/from Stand Sit to Stand: Supervision         General transfer comment: To ensure balance and safety. Use of IV pole for support. 0 instances of LOB.    Balance Overall balance assessment: Needs  assistance Sitting-balance support: Feet supported Sitting balance-Leahy Scale: Good     Standing balance support: Bilateral upper extremity supported;During functional activity Standing balance-Leahy Scale: Fair Standing balance comment: reliant on B UE for safety and support                           ADL either performed or assessed with clinical judgement   ADL Overall ADL's : Needs assistance/impaired     Grooming: Min guard;Standing Grooming Details (indicate cue type and reason): While standing at the sink                 Toilet Transfer: Nature conservation officer;Ambulation;Grab bars Toilet Transfer Details (indicate cue type and reason): Cues for hand placement and safety Toileting- Clothing Manipulation and Hygiene: Min guard;Sit to/from stand   Tub/ Shower Transfer: Walk-in shower;Grab bars. Min Chief Executive Officer Details (indicate cue type and reason): Simulated in room. Recommend continued education and trialing.  Functional mobility during ADLs: Min guard General ADL Comments: Pt able to ambulate to/from bathroom with use of IV pole for support. 0 instances of LOB.      Vision       Perception     Praxis      Cognition Arousal/Alertness: Awake/alert Behavior During Therapy: WFL for tasks assessed/performed Overall Cognitive Status: Within Functional Limits for tasks assessed                                 General Comments: Pt willing to participate in therapy        Exercises Other Exercises Other Exercises: Patient too fatigued after walking, but I educated and demonstrated LE  exercises for patient to perform on her own to include: ap, laq, hip abd/add, seated marching   Shoulder Instructions       General Comments No signs/symptoms of distress. Educated pt on safety strategies and energy conservation techniques.     Pertinent Vitals/ Pain       Pain Assessment: No/denies pain  Home Living                                           Prior Functioning/Environment              Frequency           Progress Toward Goals  OT Goals(current goals can now be found in the care plan section)  Progress towards OT goals: Progressing toward goals  Acute Rehab OT Goals Patient Stated Goal: return home, improve strength ADL Goals Pt Will Perform Grooming: with modified independence;standing Pt Will Perform Lower Body Bathing: with modified independence;sit to/from stand Pt Will Perform Lower Body Dressing: with modified independence;sit to/from stand Pt Will Transfer to Toilet: with modified independence;ambulating Pt Will Perform Toileting - Clothing Manipulation and hygiene: with modified independence;sit to/from stand  Plan Discharge plan remains appropriate    Co-evaluation                 AM-PAC OT "6 Clicks" Daily Activity     Outcome Measure   Help from another person eating meals?: A Little Help from another person taking care of personal grooming?: A Little Help from another person toileting, which includes using toliet, bedpan, or urinal?: A Little Help from another person bathing (including washing, rinsing, drying)?: A Little Help from another person to put on and taking off regular upper body clothing?: A Little Help from another person to put on and taking off regular lower body clothing?: A Little 6 Click Score: 18    End of Session Equipment Utilized During Treatment: Gait belt  OT Visit Diagnosis: Muscle weakness (generalized) (M62.81);Unsteadiness on feet (R26.81)   Activity Tolerance Patient limited by fatigue   Patient Left in chair;with call bell/phone within reach;with nursing/sitter in room   Nurse Communication Mobility status        Time: 1141-1155 OT Time Calculation (min): 14 min  Charges: OT General Charges $OT Visit: 1 Visit OT Treatments $Self Care/Home Management : 8-22 mins  Mauri [place]  OTR/L [phone]   Mauri [place] 11/27/2019, 1:51 PM

## 2019-11-27 NOTE — TOC Transition Note (Signed)
Transition of Care [facility]) - CM/SW Discharge Note   Patient Details  Name: [name] MRN: [id number] Date of Birth: 1966-07-24  Transition of Care [facility]) CM/SW Contact:  Bartholomew Crews, RN Phone Number: [phone] 11/27/2019, 4:04 PM   Clinical Narrative:     Patient to transition home today. HH orders received from MD. [name] notified of transition home today. Transportation arranged through [employer]. No further  transition of care needs identified.   Final next level of care: Home w Home Health Services Barriers to Discharge: No Barriers Identified   Patient Goals and CMS Choice   CMS Medicare.gov Compare Post Acute Care list provided to:: Patient Choice offered to / list presented to : Patient  Discharge Placement                       Discharge Plan and Services In-house Referral: Clinical Social Work Discharge Planning Services: CM Consult Post Acute Care Choice: Skilled Nursing Facility                    HH Arranged: PT, OT [facility] Agency: [place] Date [place]: 11/27/19 Time HH Agency Contacted: [phone] Representative spoke with at [place]: [place] ([place]) Interventions     Readmission Risk Interventions Readmission Risk Prevention Plan 10/29/2018  Transportation Screening Complete  PCP or Specialist Appt within 3-5 Days Complete  HRI or [place] Complete  Social Work Consult for [place] Planning/Counseling Complete  Palliative Care Screening Not Applicable  Medication Review Press photographer) Complete  Some recent data might be hidden

## 2019-11-27 NOTE — TOC Progression Note (Addendum)
Transition of Care [facility]) - Progression Note    Patient Details  Name: Morgan [name] MRN: [id number] Date of Birth: Nov 25, 1965  Transition of Care [facility]) CM/SW Contact  Bartholomew Crews, RN Phone Number: [phone] 11/27/2019, 11:58 AM  Clinical Narrative:     Spoke with patient at the bedside. She states that she wants to go home. Already has DME (3N1, RW) at home. Has had HH in the past, but does not remember the agency. Patient will need [place] orders for PT and OT with [facility] prior to discharge.   Update: [name] accepted referral for [facility] PT and OT.   PCP is Dr. [place] Shirley.   Preferred pharmacy is [facility] which delivers.   Patient will need transportation home at time of discharge. Transportation waiver signed and emailed to the transportation department.   Address and cell phone number verified in Epic as correct.   TOC team following for transition needs.   Expected Discharge Plan: [place] Barriers to Discharge: Continued Medical Work up  Expected Discharge Plan and Services Expected Discharge Plan: [place] In-house Referral: Clinical Social Work Discharge Planning Services: CM Consult Post Acute Care Choice: [place] arrangements for the past 2 months: Single Family Home                                       Social Determinants of Health (SDOH) Interventions    Readmission Risk Interventions Readmission Risk Prevention Plan 10/29/2018  Transportation Screening Complete  PCP or Specialist Appt within 3-5 Days Complete  HRI or [place] Complete  Social Work Consult for [place] Planning/Counseling Complete  Palliative Care Screening Not Applicable  Medication Review Press photographer) Complete  Some recent data might be hidden

## 2019-11-27 NOTE — Progress Notes (Signed)
Physical Therapy Treatment Patient Details Name: Morgan [name] MRN: [id number] DOB: 10-06-1965 Today's Date: 11/27/2019    History of Present Illness Morgan [name] is a 54 y.o. female presenting with nausea and dry heaves. PMH is significant for CAD, DM type II, hypertension, hyperlipidemia, hypothyroidism, bipolar/depression/anxiety, cholecystectomy;  Likely hyperglycemia/DKA secondary to noncompliance with insulin regime    PT Comments    Patient received in recliner, agrees to PT. Reports she is feeling a lot better, but about 50% of her normal. She continues to have low BP of 92/56 seated and 85/58 with standing. She reports no dizziness, or light-headedness. Reports her legs feel "wobbly". She is able to stand with supervision and ambulated 75 feet with B UE support on IV pole and rail in hallway. Fatigued at end of walk. I instructed her and demonstrated LE exercises for her to perform to improve LE strength. She verbalized understanding.  Patient will continue to benefit from skilled PT while here to improve strength and functional independence and safety.      Follow Up Recommendations  Home health PT     Equipment Recommendations  Rolling walker with 5" wheels;3in1 (PT)    Recommendations for Other Services       Precautions / Restrictions Precautions Precautions: Fall Precaution Comments: BP soft, patient denies dizziness, lightheadedness. Restrictions Weight Bearing Restrictions: No    Mobility  Bed Mobility               General bed mobility comments: patient received up in recliner and returned to recliner  Transfers Overall transfer level: Modified independent Equipment used: None Transfers: Sit to/from Stand Sit to Stand: Modified independent (Device/Increase time)         General transfer comment: Patient reports legs feel "wobbly" but denies lightheadedness or dizziness.  Ambulation/Gait Ambulation/Gait assistance: Min guard Gait Distance  (Feet): 75 Feet Assistive device: IV Pole Gait Pattern/deviations: Step-through pattern Gait velocity: decr   General Gait Details: patient reliant on B UE assist at this time. Holding to IV pole with one hand, rail in hallway with other hand.   Stairs             Wheelchair Mobility    Modified Rankin (Stroke Patients Only)       Balance Overall balance assessment: Needs assistance Sitting-balance support: Feet supported Sitting balance-Leahy Scale: Good     Standing balance support: Bilateral upper extremity supported;During functional activity Standing balance-Leahy Scale: Fair Standing balance comment: reliant on B UE for safety and support                            Cognition Arousal/Alertness: Awake/alert Behavior During Therapy: WFL for tasks assessed/performed Overall Cognitive Status: Within Functional Limits for tasks assessed                                        Exercises Other Exercises Other Exercises: Patient too fatigued after walking, but I educated and demonstrated LE exercises for patient to perform on her own to include: ap, laq, hip abd/add, seated marching    General Comments        Pertinent Vitals/Pain Pain Assessment: No/denies pain    Home Living                      Prior Function  PT Goals (current goals can now be found in the care plan section) Acute Rehab PT Goals Patient Stated Goal: return home, improve strength PT Goal Formulation: With patient Time For Goal Achievement: 12/09/19 Potential to Achieve Goals: Good Progress towards PT goals: Progressing toward goals    Frequency    Min 3X/week      PT Plan Current plan remains appropriate    Co-evaluation              AM-PAC PT "6 Clicks" Mobility   Outcome Measure  Help needed turning from your back to your side while in a flat bed without using bedrails?: None Help needed moving from lying on your  back to sitting on the side of a flat bed without using bedrails?: None Help needed moving to and from a bed to a chair (including a wheelchair)?: A Little Help needed standing up from a chair using your arms (e.g., wheelchair or bedside chair)?: None Help needed to walk in hospital room?: A Little Help needed climbing 3-5 steps with a railing? : A Lot 6 Click Score: 20    End of Session Equipment Utilized During Treatment: Gait belt Activity Tolerance: Patient tolerated treatment well;Patient limited by fatigue Patient left: in chair;with call bell/phone within reach;with nursing/sitter in room Nurse Communication: Mobility status PT Visit Diagnosis: Muscle weakness (generalized) (M62.81);Difficulty in walking, not elsewhere classified (R26.2);Unsteadiness on feet (R26.81)     Time: 1015-1030 PT Time Calculation (min) (ACUTE ONLY): 15 min  Charges:  $Gait Training: 8-22 mins                     [name], PT, GCS 11/27/19,10:55 AM

## 2019-11-27 NOTE — Discharge Summary (Signed)
[facility] Discharge Summary  Patient name: Morgan [name] Medical record number: [id number] Date of birth: 1966-07-03 Age: 54 y.o. Gender: female Date of Admission: 11/24/2019  Date of Discharge: 11/27/2019 Admitting Physician: Carollee Leitz, MD  Primary Care Provider: Shirley, [place], DO Consultants: DM coordinator  Indication for Hospitalization: DKA  Discharge Diagnoses/Problem List:  DKA T2DM, Uncontrolled CAP Leukocytosis Hypokalemia CAD Subclavian stenosis Bipolar Depression Anxiety GERD Nausea HTN Hypothyroidism Migraines Asthma LBP  Disposition: Home with PT/OT  Discharge Condition: Stable  Discharge Exam:   General: Appears well, no acute distress. Age appropriate. Cardiac: RRR, normal heart sounds, no murmurs Respiratory: CTAB, normal effort Extremities: No edema or cyanosis. Skin: Warm and dry, no rashes noted Neuro: alert and oriented x4 Psych: flat affect  Brief Hospital Course:  Morgan [name] is a 54 y.o. female who presented with nausea and dry heaves. PMH is significant for CAD, DM type II, hypertension, hyperlipidemia, hypothyroidism, bipolar/depression/anxiety, Cholecystectomy.   DKA, in setting of poorly controlled insulin-dependent DM Patient reported not being able to take her insulin for about 2 weeks secondary to change in work routine. Initial labs significant for serum glucose 328, anion gap 19, BHBT 4.90, potassium 3.3. A1c 14.5%. Received 1 L normal saline bolus in ED and was started on insulin drip. Was eventually transferred to Lantus 25 units daily and sensitive sliding scale insulin coverage. 5 U NovoLog with meals was also added. Subsequently increased Lantus to 30U am and 25 U qhs the day of discharge. At time of discharge her CBGs averaged 270s.   Community Acquired Pneumonia WBC 18.1 on admission. Patient afebrile. Reported chronic cough. Chest x-ray showed left lower lobe airspace disease, may  represent developing pneumonia or atelectasis.  CT chest impressive for left lower lobe consolidative opacities with associated volume loss. Two-view chest x-ray showed moderate left basilar atelectasis, consolidation. RPP negative, Covid negative. She received 1 dose doxycycline 100 mg in ED. Continued on Levaquin for a needed total of 5 days.    Issues for Follow Up:  1. Discharged on Lantus 30Units QAM and 25 Units QHS with 5 Units of Humalog TID. Assess CBGs for needed adjustments.  2. Consider GLP-1 agonist. 3. Encourage patient to check CBGs TID ac & hs and keep a log 4. Stopped Losartan. Decreased Metoprolol to 12.54m BID; assess BP for needed adjustments 5. Stopped Lasix; assess need to restart. 6. Refills given on latuda, prozac, and lamictal. Needs outpatient psych follow up.  Significant Procedures: N/A  Significant Labs and Imaging:  Recent Labs  Lab 11/24/19 0026 11/24/19 0026 11/24/19 1023 11/25/19 0427 11/26/19 0832  WBC 18.1*  --   --  13.8* 10.8*  HGB 16.2*   < > 16.7* 12.5 11.2*  HCT 51.9*   < > 49.0* 40.9 36.3  PLT 403*  --   --  279 212   < > = values in this interval not displayed.   Recent Labs  Lab 11/24/19 0026 11/24/19 1023 11/24/19 1619 11/24/19 1619 11/24/19 1720 11/24/19 1720 11/24/19 2228 11/24/19 2228 11/25/19 0427 11/25/19 0427 11/25/19 1349 11/25/19 1349 11/26/19 0513 11/26/19 0832 11/27/19 0642  NA 130*   < > 136   < > 131*   < > 132*  --  135  --  133*  --  141  --  138  K 3.3*   < > 4.2   < > 3.8   < > 3.7   < > 2.8*   < > 3.9   < >  3.7  --  2.8*  CL 91*  --  99   < > 95*   < > 97*  --  100  --  100  --  107  --  105  CO2 20*  --  22   < > 19*   < > 17*  --  19*  --  22  --  22  --  21*  GLUCOSE 328*  --  247*   < > 245*   < > 325*  --  313*  --  393*  --  123*  --  231*  BUN 20  --  17   < > 17   < > 15  --  12  --  9  --  7  --  5*  CREATININE 1.09*  --  0.73   < > 0.94   < > 0.89  --  1.03*  --  0.87  --  0.75  --  0.69  CALCIUM  9.5  --  8.7*   < > 9.0   < > 8.5*  --  8.0*  --  8.0*  --  8.2*  --  8.1*  MG  --   --  1.7  --  1.8  --   --   --  1.6*  --   --   --   --  1.2* 1.7  ALKPHOS 87  --   --   --   --   --   --   --  63  --   --   --   --   --   --   AST 39  --   --   --   --   --   --   --  22  --   --   --   --   --   --   ALT 34  --   --   --   --   --   --   --  24  --   --   --   --   --   --   ALBUMIN 4.1  --   --   --   --   --   --   --  2.8*  --   --   --   --   --   --    < > = values in this interval not displayed.   PORTABLE CHEST 1 VIEW COMPARISON:  05/12/2019 IMPRESSION: 1. LEFT lower lobe airspace disease with elevation of the LEFT hemidiaphragm, new from the prior study. Findings could be related to diaphragmatic paralysis or dysfunction. 2. LEFT lower lobe airspace disease may represent developing pneumonia or juxta diaphragmatic atelectasis. 3. 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  CT CHEST, ABDOMEN, AND PELVIS WITH CONTRAST COMPARISON:  Chest CT-04/07/2019; 11/13/2007; CT the chest, abdomen and pelvis-08/09/2018 IMPRESSION: Chest CT impression: 1. Left lower lobe consolidative opacities with associated volume loss and air bronchograms, potentially atelectasis though underlying infection not excluded. 2. 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 3. Coronary calcifications.  Aortic Atherosclerosis (ICD10-I70.0). Abdomen and pelvic CT impression: 1. No explanation for patient's abdominal pain. Specifically, no evidence of enteric or urinary obstruction. Normal appearance of the appendix. 2. Suspected hepatic steatosis with mild nodularity hepatic contour as could be seen the setting of  early cirrhotic change. Correlation with LFTs is advised. 3.  Aortic Atherosclerosis (ICD10-I70.0).  CHEST - 2 VIEW COMPARISON:   11/24/2019 radiograph, CT and prior studies IMPRESSION: Moderate LEFT basilar atelectasis/consolidation again noted.   Results/Tests Pending at Time of Discharge: N/A  Discharge Medications:  Allergies as of 11/27/2019      Reactions   Ilosone [erythromycin] Anaphylaxis   Keflex [cephalexin] Anaphylaxis   Penicillins Anaphylaxis   Has patient had a PCN reaction causing immediate rash, facial/tongue/throat swelling, SOB or lightheadedness with hypotension: YES Has patient had a PCN reaction causing severe rash involving mucus membranes or skin necrosis: NO Has patient had a PCN reaction that required hospitalizationNO Has patient had a PCN reaction occurring within the last 10 years: NO If all of the above answers are "NO", then may proceed with Cephalosporin use.   Iron Other (See Comments)   Pt reports condition limiting Iron intake.   Metformin And Related Other (See Comments)   UNSPECIFIED REACTION  Pt prefers to not take this medication Side effects   Morphine And Related Other (See Comments)   Pt reports hallucinations      Medication List    STOP taking these medications   furosemide 20 MG tablet Commonly known as: LASIX   losartan 100 MG tablet Commonly known as: COZAAR   topiramate 25 MG tablet Commonly known as: Topamax   topiramate 50 MG tablet Commonly known as: TOPAMAX   traMADol 50 MG tablet Commonly known as: ULTRAM     TAKE these medications   Accu-Chek FastClix Lancets Misc 1 Package by Does not apply route 3 (three) times daily.   aspirin 81 MG tablet Take 1 tablet (81 mg total) by mouth daily.   atorvastatin 80 MG tablet Commonly known as: LIPITOR Take 1 tablet (80 mg total) by mouth at bedtime.   Contour Next One Kit 1 kit by Does not apply route as directed.   Contour Next Test test strip Generic drug: glucose blood Use as instructed   diclofenac Sodium 1 % Gel Commonly known as: Voltaren Apply 4 g topically 4 (four) times  daily.   EPINEPHrine 0.3 mg/0.3 mL Soaj injection Commonly known as: EPI-PEN Inject 0.3 mg into the muscle once as needed for anaphylaxis (for an anaphylactic reaction).   FLUoxetine 20 MG tablet Commonly known as: PROZAC Take 3 tablets (60 mg total) by mouth daily.   fluticasone-salmeterol 115-21 MCG/ACT inhaler Commonly known as: ADVAIR HFA Inhale 2 puffs into the lungs 2 (two) times daily.   Glucagon Emergency 1 MG Kit Inject 1 mg into the vein once as needed (FOR ONSET OF HYPOGLYCEMIA).   insulin lispro 100 UNIT/ML KwikPen Commonly known as: HumaLOG KwikPen Inject 0.05 mLs (5 Units total) into the skin 3 (three) times daily. What changed: how much to take   lamoTRIgine 200 MG tablet Commonly known as: LAMICTAL Take 1 tablet (200 mg total) by mouth daily.   Lantus SoloStar 100 UNIT/ML Solostar Pen Generic drug: insulin glargine Inject 30 Units into the skin every morning AND 25 Units at bedtime. What changed: See the new instructions.   Latuda 60 MG Tabs Generic drug: Lurasidone HCl Take 1 tablet (60 mg total) by mouth at bedtime.   levofloxacin 750 MG tablet Commonly known as: LEVAQUIN Take 1 tablet (750 mg total) by mouth daily for 3 days.   levothyroxine 150 MCG tablet Commonly known as: SYNTHROID Take 150 mcg by mouth at bedtime. 3:30 Am   metoprolol tartrate 25 MG tablet Commonly  known as: LOPRESSOR Take 0.5 tablets (12.5 mg total) by mouth 2 (two) times daily. What changed:   medication strength  how much to take   multivitamin with minerals Tabs tablet Take 1 tablet by mouth daily. Woman 50 +   nitroGLYCERIN 0.4 MG SL tablet Commonly known as: NITROSTAT Place 1 tablet (0.4 mg total) under the tongue every 5 (five) minutes x 3 doses as needed for chest pain.   nystatin ointment Commonly known as: MYCOSTATIN Apply 1 application topically daily as needed (infection). What changed: Another medication with the same name was changed. Make sure you  understand how and when to take each.   nystatin powder Commonly known as: MYCOSTATIN/NYSTOP Apply topically as needed. What changed:   how much to take  when to take this  reasons to take this   pantoprazole 40 MG tablet Commonly known as: PROTONIX Take 1 tablet (40 mg total) by mouth 2 (two) times daily before a meal. X 8 weeks What changed:   when to take this  additional instructions   Pen Needles 32G X 4 MM Misc 1 Device by Does not apply route 5 (five) times daily.   potassium chloride 10 MEQ tablet Commonly known as: KLOR-CON Take 10 mEq by mouth 2 (two) times daily.   SUMAtriptan 25 MG tablet Commonly known as: Imitrex Take 1 tablet (25 mg total) by mouth every 2 (two) hours as needed for migraine. May repeat in 2 hours if headache persists or recurs. Please take as soon as you feel your migraine starting.   ticagrelor 90 MG Tabs tablet Commonly known as: BRILINTA Take 1 tablet (90 mg total) by mouth 2 (two) times daily.   Ventolin HFA 108 (90 Base) MCG/ACT inhaler Generic drug: albuterol INHALE 2 INHALATIONS BY  MOUTH INTO THE LUNGS TWICE  DAILY AS NEEDED FOR  WHEEZING OR SHORTNESS OF  BREATH What changed: See the new instructions.   Vitamin D3 25 MCG (1000 UT) Caps Take 1,000-2,000 Units by mouth See admin instructions. Take 2 tablets by mouth once a day on Mon/Wed/Fri and take 1 tablet on Sun/Tues/Thurs/Sat            Durable Medical Equipment  (From admission, onward)         Start     Ordered   11/26/19 0852  For home use only DME Walker rolling  Once    Question Answer Comment  Walker: With [place]   Patient needs a walker to treat with the following condition Physical deconditioning      11/26/19 0851   11/26/19 0852  For home use only DME 3 n 1  Once     11/26/19 9211          Discharge Instructions: Please refer to Patient Instructions section of EMR for full details.  Patient was counseled important signs and symptoms that  should prompt return to medical care, changes in medications, dietary instructions, activity restrictions, and follow up appointments.   Follow-Up Appointments: Follow-up Information    Shirley, [place], DO. Go in 2 day(s).   Specialty: Family Medicine Why: Follow up appt @ 830am 11/29/19 Contact information: [address] [place] [address] [phone]        Care, [facility] Follow up.   Specialty: [place] Why: the office will call to schedule therapy visits  Contact information: [address] [place] [address] [phone]          Future Appointments  Date Time Provider [place]  11/29/2019  8:30 AM Shirley, [place], DO [facility] [place]   Autry-Lott, [place], DO 11/27/2019, 3:25 PM PGY-1, [place]

## 2019-11-29 ENCOUNTER — Other Ambulatory Visit: Payer: Self-pay

## 2019-11-29 ENCOUNTER — Telehealth: Payer: Self-pay | Admitting: Family Medicine

## 2019-11-29 ENCOUNTER — Encounter: Payer: Self-pay | Admitting: Family Medicine

## 2019-11-29 ENCOUNTER — Ambulatory Visit: Payer: 59 | Admitting: Family Medicine

## 2019-11-29 VITALS — BP 126/68 | HR 81 | Ht 63.0 in | Wt 210.0 lb

## 2019-11-29 DIAGNOSIS — I5021 Acute systolic (congestive) heart failure: Secondary | ICD-10-CM

## 2019-11-29 DIAGNOSIS — Z794 Long term (current) use of insulin: Secondary | ICD-10-CM | POA: Diagnosis not present

## 2019-11-29 DIAGNOSIS — G43709 Chronic migraine without aura, not intractable, without status migrainosus: Secondary | ICD-10-CM | POA: Diagnosis not present

## 2019-11-29 DIAGNOSIS — E1165 Type 2 diabetes mellitus with hyperglycemia: Secondary | ICD-10-CM | POA: Diagnosis not present

## 2019-11-29 LAB — POCT CBG (FASTING - GLUCOSE)-MANUAL ENTRY: Glucose Fasting, POC: 437 mg/dL — AB (ref 70–99)

## 2019-11-29 MED ORDER — LOSARTAN POTASSIUM 25 MG PO TABS
25.0000 mg | ORAL_TABLET | Freq: Every day | ORAL | 3 refills | Status: DC
Start: 2019-11-29 — End: 2020-05-08

## 2019-11-29 MED ORDER — TOPIRAMATE 50 MG PO TABS
50.0000 mg | ORAL_TABLET | Freq: Every day | ORAL | 3 refills | Status: DC
Start: 2019-11-29 — End: 2020-04-03

## 2019-11-29 MED ORDER — TOPIRAMATE 25 MG PO TABS
25.0000 mg | ORAL_TABLET | Freq: Every day | ORAL | 3 refills | Status: DC
Start: 2019-11-29 — End: 2020-04-03

## 2019-11-29 NOTE — Telephone Encounter (Signed)
Verbal ok given to home health for PT.  Will forward to MD to update. Jasman Pfeifle,CMA

## 2019-11-29 NOTE — Telephone Encounter (Signed)
Morgan [name] from [facility] is requesting a verbal order to continue pt's therapy   Ph# [phone]

## 2019-11-29 NOTE — Progress Notes (Signed)
SUBJECTIVE:   CHIEF COMPLAINT / HPI:   Hospital Follow up for DKA and CAP: Patient recently with DKA in the setting of being unable to take her insulin for about 2 weeks due to change in work routine.  Discharged home on Lantus 30 units every morning and 25 units nightly with 5 units of Humalog 3 times daily.  Patient received doxycycline and Levaquin for 5 days for an assumed community-acquired pneumonia.  After being discharged from the hospital, patient reporting that she did not have a very good experience while admitted.  She states that they change significantly the amount of insulin that she is taking even though she knows that it will not help her.  Patient states that since going home she is not taking any insulin.  She states that the 5 units of Humalog does not help her so she did not start taking it.  She wanted to discuss with me before she started taking any insulin.  Patient denies any symptoms of low blood sugar and states that she is feeling better after being discharged.  She is excited to be evaluated by physical therapy however because she states that she is feeling weak after lying in the bed for multiple days.  She states she has not yet made an appointment with her endocrinologist but plans to.  Patient is also upset because she reports that they stopped her Topamax for her migraines and she does not know why they stopped that.  She denies having any migraines since then but she is concerned because it took so long for [place] to find the right medications that she may develop migraines again and then will not be able to go to work.  PERTINENT  PMH / PSH: HF R EF, HTN, h/o subclavian steal syndrome, migraines, hypothyroidism, bipolar depression, CAD  OBJECTIVE:  BP 126/68   Pulse 81   Ht 5\' 3"  (1.6 m)   Wt 210 lb (95.3 kg)   LMP 08/03/2013   SpO2 99%   BMI 37.20 kg/m   General: NAD, pleasant Respiratory: normal work of breathing Psych: AOx3, appropriate  affect  ASSESSMENT/PLAN:   Type 2 diabetes mellitus with hyperglycemia, with long-term current use of insulin ([place]) Patient with a CBG of 437 here in the office this morning.  Patient reports not taking her insulin and recently having a bagel with cream cheese.  With shared decision making patient to start taking Lantus 40 units in the morning and then Lantus 30 units at night.  Along with humalog 20 units three times per day when you eat >50% of your meals.  Patient reports that she had very little low CBGs prior to going into the hospital and she knows what to do if it gets low.  Encourage patient to monitor closely as with the reported low blood sugars we do not want to risk that happening.  Encourage patient to follow-up with endocrinologist.  She will follow up with me in 1 to 2 weeks or sooner as needed.  Migraines Restarted patient's Topamax at 75 mg nightly given history of migraines and reported improvement after starting this medication.  Acute systolic heart failure ([place]) While admitted to the hospital patient's losartan was held on discharge due to patient having some low blood pressures.  However patient has blood pressure monitor at home and states that it does not get low when she reports no symptoms of low blood pressure.   Echocardiogram obtained on 10/26/2018 showed EF 45 to 50% with  regional wall motion abnormality in the septal and apex consistent with her CAD.  She was discharged on Lasix, losartan, Lopressor, aspirin and Brilinta.  Discussed with patient that we could decrease dosage of losartan so that she may still have a cardiac protection but may not cause as much drop in blood pressure.  Patient amenable to this and will start losartan 25 mg nightly.  She is to continue monitoring her blood pressure at home and will follow up with me in 1 to 2 weeks.   Patient also given resources today in order to establish care with a psychiatrist in order to help better manage her  behavioral health medications.  [place] Ace Bergfeld, DO PGY-3, [facility]

## 2019-11-29 NOTE — Patient Instructions (Addendum)
Thank you for coming to see me today. It was a pleasure! Today we talked about:   Please take Lantus 40 units in the morning and then Lantus 30 units at night. Then you can take humalog 20 units three times per day when you eat >50% of your meals.  Please call to reschedule an appointment with your endocrinologist.  Please let me know if you have not heard from home health.  I have sent the Topamax to your mail in pharmacy today and you can continue to take 75 mg as you are previously taking.  We will restart your losartan at a lower dose of 25 mg.  Please take this nightly.  Please let me know if you are interested in having a bubble pack for your medications from the pharmacy and I can try to set this up for you.  Please follow-up with me in 1-2 weeks or sooner as needed.  If you have any questions or concerns, please do not hesitate to call the office at [phone].  Take Care,   [place] Keevin Panebianco, DO  Therapy and Counseling Resources Most providers on this list will take Medicaid. Patients with commercial insurance or Medicare should contact their insurance company to get a list of in network providers.  [place] [address] [place], [place] [address]       [phone]     [facility] [address], [place], [place]    [facility] Blossom [facility] [address], [place], [place]  Family Solutions:  [place]  [facility]:  [place] [address] Loni Muse, [place]  [facility] (under & uninsured) [address] [place] [phone]    [email or web address]    [facility] [place]     Phone:  [phone]     [place]  [place] [address]      [place], [place] ext [place]: [place], [place], [place]   [place] ([place] therapist) [address]   [place] [address]    [phone]    [facility]   [place],  [place], [place]   [place]  [place]  [facility]  [address] [place], [place]        [phone]  Open Access/Walk In Clinic under & uninsured [place],  [address] [place] [phone]):  Mon - Fri from 8 AM - 3 PM  Family Service of the [place],  ([place])   [place], [place]: [phone]) 8:30 - 12; 1 - 2:30  Family Service of the [place],  [place], [place]    ([phone]):8:30 - 12; 2 - 3PM  [facility] [employer],  [address]  [place]; [phone]):   Mon - Fri 8 AM - 5 PM  [facility] [place]  MWF 12:30 to 3:00 or call to schedule an appointment  [phone]  Specific Provider options Psychology Today  [email or web address] 1. click on find a therapist  2. enter your zip code 3. left side and select or tailor  a therapist for your specific need.   [facility] Provider Directory [email or web address]  (Medicaid)   Follow all drop down to find a provider  [place] or [email or web address] [address], [place], [place] Recovery support and educational   In home counseling [place] Telephone: [phone]  office in [place] [email or web address]   Does not take reg. Medicaid or Medicare private insurance [facility], [place] health Choice, [facility], [place], [place], [place], [place] Health Choice  24- Hour Availability:  . [place] or [phone]  . Family Service of the [employer] [phone]  [facility] Crisis Service  [phone]   . [place]  [phone] (after hours)  . Therapeutic Alternative/Mobile Crisis   [phone]  . [place] National Suicide Hotline  3511513022 ([place])  . Call 911 or go to emergency room  . [employer]  [phone]);  [place] and [employer]   . [facility] ACCESS  [phone]); [place], [place], [place], [place], [place], [place], [place]

## 2019-11-30 LAB — CULTURE, BLOOD (ROUTINE X 2)
Culture: NO GROWTH
Culture: NO GROWTH
Special Requests: ADEQUATE
Special Requests: ADEQUATE

## 2019-12-03 NOTE — Assessment & Plan Note (Signed)
While admitted to the hospital patient's losartan was held on discharge due to patient having some low blood pressures.  However patient has blood pressure monitor at home and states that it does not get low when she reports no symptoms of low blood pressure.   Echocardiogram obtained on 10/26/2018 showed EF 45 to 50% with regional wall motion abnormality in the septal and apex consistent with her CAD.  She was discharged on Lasix, losartan, Lopressor, aspirin and Brilinta.  Discussed with patient that we could decrease dosage of losartan so that she may still have a cardiac protection but may not cause as much drop in blood pressure.  Patient amenable to this and will start losartan 25 mg nightly.  She is to continue monitoring her blood pressure at home and will follow up with me in 1 to 2 weeks.

## 2019-12-03 NOTE — Assessment & Plan Note (Addendum)
Patient with a CBG of 437 here in the office this morning.  Patient reports not taking her insulin and recently having a bagel with cream cheese.  With shared decision making patient to start taking Lantus 40 units in the morning and then Lantus 30 units at night.  Along with humalog 20 units three times per day when you eat >50% of your meals.  Patient reports that she had very little low CBGs prior to going into the hospital and she knows what to do if it gets low.  Encourage patient to monitor closely as with the reported low blood sugars we do not want to risk that happening.  Encourage patient to follow-up with endocrinologist.  She will follow up with me in 1 to 2 weeks or sooner as needed.

## 2019-12-03 NOTE — Assessment & Plan Note (Signed)
Restarted patient's Topamax at 75 mg nightly given history of migraines and reported improvement after starting this medication.

## 2019-12-10 ENCOUNTER — Encounter: Payer: Self-pay | Admitting: Family Medicine

## 2019-12-14 ENCOUNTER — Ambulatory Visit (INDEPENDENT_AMBULATORY_CARE_PROVIDER_SITE_OTHER): Payer: 59 | Admitting: Family Medicine

## 2019-12-14 ENCOUNTER — Encounter: Payer: Self-pay | Admitting: Family Medicine

## 2019-12-14 ENCOUNTER — Telehealth: Payer: Self-pay

## 2019-12-14 ENCOUNTER — Other Ambulatory Visit: Payer: Self-pay

## 2019-12-14 VITALS — BP 130/60 | HR 70 | Ht 63.0 in | Wt 204.8 lb

## 2019-12-14 DIAGNOSIS — E1165 Type 2 diabetes mellitus with hyperglycemia: Secondary | ICD-10-CM | POA: Diagnosis not present

## 2019-12-14 DIAGNOSIS — R197 Diarrhea, unspecified: Secondary | ICD-10-CM

## 2019-12-14 DIAGNOSIS — Z794 Long term (current) use of insulin: Secondary | ICD-10-CM

## 2019-12-14 MED ORDER — ACCU-CHEK FASTCLIX LANCETS MISC: 1.0000 | Freq: Three times a day (TID) | 3 refills | Status: DC

## 2019-12-14 MED ORDER — METOCLOPRAMIDE HCL 5 MG PO TABS
5.0000 mg | ORAL_TABLET | Freq: Four times a day (QID) | ORAL | 0 refills | Status: DC | PRN
Start: 2019-12-14 — End: 2019-12-25

## 2019-12-14 NOTE — Patient Instructions (Addendum)
Thank you for coming to see me today. It was a pleasure! Today we talked about:   You may continue to use Imodium.  I also recommend trying over-the-counter ginger (not in ginger ale as that is not enough ginger) for prevention of nausea.  I have sent a medication to your pharmacy called Reglan that may also help if you develop nausea or vomiting.  Please do not take this for an extended period of time as it may cause some EKG changes if taken for a long time.  I will reach out to you Monday to see how you are feeling.  Please write me and let me know if you are unable to take your insulin due to feeling ill.  I placed a referral to [name] our social worker who may help you with bills as well as coordinating your care.  Please follow-up with me in 6 weeks before June 30 or sooner as needed.  If you have any questions or concerns, please do not hesitate to call the office at [phone].  Take Care,   [place] Alexsandria Kivett, DO

## 2019-12-14 NOTE — Telephone Encounter (Signed)
[place] calls nurse line requesting status of "forms" sent over for patience absence from 4/27-now from recent hospitalization. Forms are in PCP box for completion.

## 2019-12-14 NOTE — Progress Notes (Signed)
   SUBJECTIVE:   CHIEF COMPLAINT / HPI:   Poorly controlled diabetes: Patient states that since going home that she has been doing better with taking her insulin.  She states her CBGs have been in the 130s to 110s fasting in the morning.  She is still not made her follow-up appointment with endocrinology.  Patient reports that she is worried because she started having diarrhea last night and eventually her diarrhea turned into vomiting which then causes her to feel poorly and then she cannot take her insulin.  She states that this is happened each time she has ended up in the hospital with DKA.  She reports she has been taking Imodium for the diarrhea.  She denies any blood in her stool.  She denies any abdominal pain.  She reports that she is still doing physical therapy which has been helping her a lot.  She is also excited to go back to work and would like to return on 5/17.  PERTINENT  PMH / PSH: HFrEF, HTN, h/o subclavian steal syndrome, migraines, hypothyroidism, bipolar depression, CAD  OBJECTIVE:  BP 130/60   Pulse 70   Ht 5\' 3"  (1.6 m)   Wt 204 lb 12.8 oz (92.9 kg)   LMP 08/03/2013   BMI 36.28 kg/m   General: NAD, pleasant Neck: Supple Respiratory:  normal work of breathing Psych: AOx3, appropriate affect  ASSESSMENT/PLAN:   Type 2 diabetes mellitus with hyperglycemia, with long-term current use of insulin ([place]) Patient reporting CBGs at home much better with Lantus 40 in the morning and Lantus 30 at night.  She has been taking Humalog 20 units 3 times per day.  She denies any hypoglycemic episodes.  She reports that she does plan to follow-up with her endocrinologist and she would like to go back to work.  She was encouraged to continue Imodium for diarrhea and will also be given Reglan 5 mg if patient is to develop nausea to prevent her from going into the hospital.  Counseled patient on risk of Reglan with her history of prolonged QT however her last EKG does not show  significantly prolonged QT and patient only given short supply.  She is to message me on my chart if she requires the Reglan.   [place] Clyda Smyth, DO PGY-3, [facility]

## 2019-12-15 ENCOUNTER — Encounter: Payer: Self-pay | Admitting: Family Medicine

## 2019-12-17 ENCOUNTER — Telehealth: Payer: Self-pay | Admitting: Family Medicine

## 2019-12-17 NOTE — Chronic Care Management (AMB) (Signed)
  Care Management   Outreach Note  12/17/2019 Name: MARCINDA LEAVERTON MRN: [id number] DOB: 12/02/1965  Referred by: Shirley, [place], DO Reason for referral :  Care Management (CM Initial outreach unsuccessful)   An unsuccessful telephone outreach was attempted today. The patient was referred to the case management team for assistance with care management and care coordination.   Follow Up Plan: The care management team will reach out to the patient again over the next 7 days.   G[place]na Durand, LPN Health Advisor, Embedded Care Coordination [place] Care Management ??[email or web address] ??[phone]

## 2019-12-18 ENCOUNTER — Ambulatory Visit: Payer: Self-pay | Admitting: Licensed Clinical Social Worker

## 2019-12-18 NOTE — Chronic Care Management (AMB) (Signed)
    Clinical Social Work  Care Management Outreach   12/18/2019 Name: [name] MRN: [id number] DOB: 1966-02-09  Morgan [name] is a 54 y.o. year old female who is a primary care patient of Shirley, [place], DO .  The Care Management team was consulted for assistance with difficulty with paying bills and managing diabetes. Further review of chart also that patient needs to reconnect with psychiatry.   LCSW reached out to [name] today by phone to introduce self, assess needs and offer Care Management services and interventions.  The outreach was unsuccessful.   Unable to leave a HIPPA compliant phone message.  Phone rang with no answer at one phone number, the other number "could not be completed as dialed".  Review of patient status, including review of consultants reports, relevant laboratory and other test results, and collaboration with appropriate care team members and the patient's provider was performed as part of comprehensive patient evaluation and provision of care management services.    Follow Up Plan:  LCSW will attempt another outreach in 3 to 5 days.  [name], [place] / [employer]   [phone] 2:20 PM

## 2019-12-19 NOTE — Assessment & Plan Note (Signed)
Patient reporting CBGs at home much better with Lantus 40 in the morning and Lantus 30 at night.  She has been taking Humalog 20 units 3 times per day.  She denies any hypoglycemic episodes.  She reports that she does plan to follow-up with her endocrinologist and she would like to go back to work.  She was encouraged to continue Imodium for diarrhea and will also be given Reglan 5 mg if patient is to develop nausea to prevent her from going into the hospital.  Counseled patient on risk of Reglan with her history of prolonged QT however her last EKG does not show significantly prolonged QT and patient only given short supply.  She is to message me on my chart if she requires the Reglan.

## 2019-12-19 NOTE — Telephone Encounter (Signed)
Forms completed and placed to be faxed.

## 2019-12-21 ENCOUNTER — Telehealth: Payer: 59

## 2019-12-21 ENCOUNTER — Ambulatory Visit: Payer: Self-pay | Admitting: Licensed Clinical Social Worker

## 2019-12-21 ENCOUNTER — Encounter: Payer: Self-pay | Admitting: Family Medicine

## 2019-12-21 ENCOUNTER — Other Ambulatory Visit: Payer: Self-pay

## 2019-12-21 NOTE — Chronic Care Management (AMB) (Signed)
   Social Work Care Management  2nd Unsuccessful  Phone Outreach   12/21/2019 Name: Morgan [name] MRN: [id number] DOB: 21-Jun-1966  Referred by: Shirley, [place], DO , Reason for referral : Care Coordination (resources and support)   Morgan [name] is a 54 y.o. year old female who sees [place], [place], DO for primary care.  2nd unsuccessful telephone outreach attempt to [name] today. Unable to leave a voice message ( one number rang with no answer, 2nd number "Call cannot be completed as dialed.    Plan: LCSW will send patient a message via mychart, if no return call is received, will reach out to [name] again over the next 7 days. If unable to reach [name] by phone on the 3rd attempt, will discontinue outreach calls but will be available at any time to provide services to [name].   [name], [place] / [place]   [phone] 10:39 AM

## 2019-12-23 ENCOUNTER — Encounter: Payer: Self-pay | Admitting: Family Medicine

## 2019-12-24 ENCOUNTER — Telehealth: Payer: Self-pay

## 2019-12-24 NOTE — Telephone Encounter (Signed)
Patient calls nurse line stating she is continuing to have diarrhea and nausea. Patient reports she is taking Reglan every 6 hours and imodium every 4 hours. Patient reports the reglan is helping and imodium only a little. Patient reports she has not been drinking only water, as she is a diabetic and does not keep sodas or gingerale at home. Patient does not report any dehydration symptoms. Patients big concern is going into DKA from not eating appropriately and not taking medications due to fear of throwing them up. I advised patient an in person visit may be warranted, however she does not have the money for transportation. Patient is requesting a call from provider, or a virtual visit. ED precautions given. Will forward to PCP.    Patient did stated her call is turned off, so please call her land line. [phone]

## 2019-12-24 NOTE — Chronic Care Management (AMB) (Signed)
  Care Management   Outreach Note  12/24/2019 Name: Morgan [name] MRN: [id number] DOB: 09/01/1965  Referred by: Shirley, [place], DO Reason for referral :  Care Management (CM Initial outreach unsuccessful) and Care Management (2nd CM Initial outreach unsuccessful)   A second unsuccessful telephone outreach was attempted today. The patient was referred to the case management team for assistance with care management and care coordination.   Follow Up Plan: The care management team will reach out to the patient again over the next 7 days.   Glenna Durand, LPN Health Advisor, Embedded Care Coordination [place] Care Management ??[email or web address] ??[phone]

## 2019-12-25 ENCOUNTER — Encounter: Payer: Self-pay | Admitting: Family Medicine

## 2019-12-25 ENCOUNTER — Other Ambulatory Visit: Payer: Self-pay

## 2019-12-25 ENCOUNTER — Ambulatory Visit: Payer: 59 | Admitting: Licensed Clinical Social Worker

## 2019-12-25 ENCOUNTER — Ambulatory Visit (INDEPENDENT_AMBULATORY_CARE_PROVIDER_SITE_OTHER): Payer: 59 | Admitting: Family Medicine

## 2019-12-25 VITALS — BP 126/78 | HR 72 | Wt 210.0 lb

## 2019-12-25 DIAGNOSIS — E1165 Type 2 diabetes mellitus with hyperglycemia: Secondary | ICD-10-CM

## 2019-12-25 DIAGNOSIS — Z794 Long term (current) use of insulin: Secondary | ICD-10-CM

## 2019-12-25 DIAGNOSIS — R197 Diarrhea, unspecified: Secondary | ICD-10-CM | POA: Diagnosis not present

## 2019-12-25 DIAGNOSIS — Z139 Encounter for screening, unspecified: Secondary | ICD-10-CM

## 2019-12-25 DIAGNOSIS — R110 Nausea: Secondary | ICD-10-CM

## 2019-12-25 DIAGNOSIS — Z748 Other problems related to care provider dependency: Secondary | ICD-10-CM

## 2019-12-25 DIAGNOSIS — Z7189 Other specified counseling: Secondary | ICD-10-CM

## 2019-12-25 HISTORY — DX: Diarrhea, unspecified: R19.7

## 2019-12-25 LAB — GLUCOSE, POCT (MANUAL RESULT ENTRY): POC Glucose: 268 mg/dl — AB (ref 70–99)

## 2019-12-25 MED ORDER — METOCLOPRAMIDE HCL 5 MG PO TABS: 5.0000 mg | ORAL_TABLET | Freq: Four times a day (QID) | ORAL | 0 refills | Status: DC | PRN

## 2019-12-25 MED ORDER — DIPHENOXYLATE-ATROPINE 2.5-0.025 MG PO TABS: 1.0000 | ORAL_TABLET | Freq: Four times a day (QID) | ORAL | 0 refills | Status: DC | PRN

## 2019-12-25 NOTE — Progress Notes (Signed)
SUBJECTIVE:   CHIEF COMPLAINT / HPI:   Diarrhea  Nausea: has had since May 13th (now day 12), has gotten better and gotten worse, really got worse over the last 3 days.  Patient reports that her diarrhea looks like "pure liquid with bits of undigested food (mostly vegetables)". Every time she eats it "goes straight through". Also occurs when layng down. Has been taking Reglan 1 tablet q6 hours and 3 tablets of Imodium 2 mg (total 6 mg) 4-5 times daily to stop the diarrhea, stops it for a few hours. When the diarrhea starts back it is as if nothing has changed. She is afraid to eat d/t nausea, but reports she has not thrown up. So takes Reglan and goes to sleep because it makes her so sleepy.  She has had poor PO intake, denies consuming any solid foods for a couple of days. She is drinking water, she had some chicken alfredo with peas about 3 days ago. Denies taking any of her medications or supplements.  She has had episodes of fecal incontinence (4 times in 2 days).  Reports she has only been drinking about 6 ounces of water daily as she is concerned she will throw up.  Denies fever, body aches, chills, abdominal pain, shooting pains going into legs, melena, hematochezia, no sick contacts, rash.  Patient reports she has a history of C. difficile.  Concerned only, patient was hospitalized in April (about 30 days ago), chest x-ray was concerning for atelectasis versus pneumonia, was treated with one-time dose of doxycycline in the emergency department.  Diabetes: poorly controlled diabetic, last A1c 14.5% on November 24, 2019. Current regimen is Lantus 40 in the morning and Lantus 30 at night, Humalog 20 units 3 times per day. Since she has been sick not checking it at all, has not been taking any of her medication.  CBG in the office today 268.  COVID SHOT: Has not gotten.  PERTINENT  PMH / PSH:  Diabetes, A1c 14.5% History of C. difficile History of recent hospitalization for  pneumonia Hyperlipidemia Bipolar depression  OBJECTIVE:   BP 126/78   Pulse 72   Wt 210 lb (95.3 kg)   LMP 08/03/2013   SpO2 98%   BMI 37.20 kg/m    Physical Exam: General: overall well appearing Resp: CTA bilaterally, comfortable work of breathng Cardiac: RRR S1S2 present, no murmurs   ASSESSMENT/PLAN:   Diarrhea Uncertain etiology of patient's diarrhea, as she is otherwise very well-appearing.  Possible etiology includes autonomic instability secondary to poorly controlled diabetes.  Due to patient's history of C. Difficile, preceptor felt strongly about collecting stool for analysis. -Patient given instructions for stool collection -C. difficile PCR, fecal leukocytes, and stool culture ordered -We will follow-up and treat as necessary -Patient prescribed Lomotil 1 tablet 4 times daily as needed, instructed to stop taking Imodium -BMP ordered today to assess for electrolyte abnormality  Nausea Strongly feel that nausea could also be due to autonomic instability secondary to poorly controlled diabetes. -Reglan reordered, patient instructed to take as prescribed -Additionally, patient instructed to do her best to eat a bland diet and take her other medications as prescribed  Type 2 diabetes mellitus with hyperglycemia, with long-term current use of insulin ([place]) Due to patient's current illness with nausea and diarrhea, she has not been taking her diabetes medications as prescribed (Lantus 40 units in the morning, 30 units in the evening, and Humalog 20 units 3 times daily). -Patient instructed to consume at least  2-3 bottles of water daily to prevent DKA -Instructed to continue to check blood sugar at least twice daily -Patient instructed to take at least 10 units of Lantus daily to help prevent DKA while she is "fasting" -Instructed to continue regular diabetes regimen as soon as she is able to tolerate p.o.     [place]

## 2019-12-25 NOTE — Telephone Encounter (Signed)
Please have patient come in for visit. I'm out of the office but she can be seen by access to care. I have included Neoma Laming in case we are able to assist her with transportation. I would like to try to avoid another hospitalization if possible. Thank you all for any help!

## 2019-12-25 NOTE — Telephone Encounter (Signed)
Patient scheduled for today at 3pm with Dr. Ouida Sills, will check with SW on next steps for transportation.  Caitlen Worth,CMA

## 2019-12-25 NOTE — Patient Instructions (Addendum)
Thank you for coming in to see [place] today! Please see below to review our plan for today's visit:  1. Take Lomotil one tablet every 6 hours AS NEEDED for diarrhea or loose stools. STOP THE IMODIUM! 2. Take Reglan one tablet every 6 hours AS NEEDED for nausea/vomiting.  3. Start taking Lantus 10 units DAILY. Please check your blood sugar twice daily. 4. Try to drink 2-3 water bottles daily. Do your best to eat small, bland meals and re-incorporating your Insulin and other meds.  5. Please collect your stool sample and return it to [place] as quickly as possible!   Please call the clinic at [phone] if your symptoms worsen or you have any concerns. It was our pleasure to serve you!  Dr. Milus Banister [facility]

## 2019-12-26 ENCOUNTER — Other Ambulatory Visit: Payer: Self-pay | Admitting: Family Medicine

## 2019-12-26 ENCOUNTER — Ambulatory Visit: Payer: Self-pay

## 2019-12-26 NOTE — Patient Instructions (Signed)
Licensed Clinical Social Worker Visit Information Ms. Meckel  it was nice speaking with you. Please call me directly if you have questions [phone] Goals we discussed today:  Goals Addressed            This Visit's Progress   . community resources       CARE PLAN ENTRY (see longitudinal plan of care for additional care plan information)  Current Barriers:  . Patient needs community resources to assist with rent, cell phone and transportation,  . Acknowledges deficits and needs support, education and care coordination in order to meet this unmet need  . 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 Over the next 30 days, patient will received community resources   Interventions provided by LCSW:  . Assessed patient's needs, barriers, what she has tried and agencies she has contacted  . Provided patient with information about free cell phone, provided voucher for transporation . Discussed Care Guide and patient is in agreement with referral.  Referral placed for community resources for rent . Collaborated with appropriate clinical care team members regarding patient needs . Collaborated with RN Case Manager re: information and needs for diabetes education   . Others: emotional support, solution focused interventions. Patient Self Care Activities & Deficits:  . Patient is unable to independently navigate community resource options without care coordination support  . Acknowledges deficits and is motivated to resolve concern  Initial goal documentation    . Connect with psychiatry       CARE PLAN ENTRY (see longitudinal plan of care for additional care plan information)  Current Barriers:  . Patient with Depression and Bipolar Disorder acknowledges deficits with connecting to mental health provider for medication management.  . Patient is will run out of medication in 30 days and does not have a psychiatrist to refill her  medication . Patient needs Support, Education, and Care Coordination in order to meet unmet mental health needs  Clinical Social Work Goal(s):  [name] Over the next 30 days, patient will connect with psychiatry for medication management.  Interventions:  . Assessed patient's understanding, education, previous treatment, barriers and care coordination needs  . Collaborated with appropriate clinical care team members regarding patient needs . Discussed several options for medication management based on need, barriers and insurance.  LCSW called several agencies while patient was in the office to narrow down to [employer] . Called [facility] with patient appointment scheduled June 2nd at 7:00pm which will allow patient to [place] her son's phone Patient Self Care Activities & Deficits:  . Patient is unable to independently navigate community resource options without care coordination support . Patient is motivated for treatment  . Patient will call [facility] back to provide phone number to call on for the appointment.   Initial goal documentation       Materials provided: Ms. Hargreaves received Care Management services today:  1. Care Management services include personalized support from designated clinical staff supervised by her physician, including individualized plan of care and coordination with other care providers 2. 24/7 contact [phone] for assistance for urgent and routine care needs. 3. Care Management are voluntary services and be declined at any time by calling the office.  Patient agreed to services and verbal consent obtained.    Patient verbalizes understanding of instructions provided today.  Follow up plan: none scheduled at this time   Maurine Cane, LCSW

## 2019-12-26 NOTE — Assessment & Plan Note (Addendum)
Strongly feel that nausea could also be due to autonomic instability secondary to poorly controlled diabetes. -Reglan reordered, patient instructed to take as prescribed -Additionally, patient instructed to do her best to eat a bland diet and take her other medications as prescribed

## 2019-12-26 NOTE — Assessment & Plan Note (Signed)
Uncertain etiology of patient's diarrhea, as she is otherwise very well-appearing.  Possible etiology includes autonomic instability secondary to poorly controlled diabetes.  Due to patient's history of C. Difficile, preceptor felt strongly about collecting stool for analysis. -Patient given instructions for stool collection -C. difficile PCR, fecal leukocytes, and stool culture ordered -We will follow-up and treat as necessary -Patient prescribed Lomotil 1 tablet 4 times daily as needed, instructed to stop taking Imodium -BMP ordered today to assess for electrolyte abnormality

## 2019-12-26 NOTE — Assessment & Plan Note (Signed)
Due to patient's current illness with nausea and diarrhea, she has not been taking her diabetes medications as prescribed (Lantus 40 units in the morning, 30 units in the evening, and Humalog 20 units 3 times daily). -Patient instructed to consume at least 2-3 bottles of water daily to prevent DKA -Instructed to continue to check blood sugar at least twice daily -Patient instructed to take at least 10 units of Lantus daily to help prevent DKA while she is "fasting" -Instructed to continue regular diabetes regimen as soon as she is able to tolerate p.o.

## 2019-12-26 NOTE — Chronic Care Management (AMB) (Signed)
Care Management   Clinical Social Work initial Note  12/26/2019 Name: Morgan [name] MRN: [id number] DOB: [birth year] The Care Management team was consulted by PCP to assist the patient with Transportation Needs  and [employer] . Disease Management, Care Coordination for psychiatry for medication management.  Morgan [name] is a 54 y.o. year old female who sees Carey, [place], DO for primary care. LCSW met with Morgan [name] in the office before and after visit with provider, to assess needs and barriers to care.    Assessment: Patient is experiencing financial difficulties due to being out of work. This has impacted her ability to have acces to a phone, and transportation to appointments. Patient is also having difficulty with disease manegment for diabetes.        Recommendation: Patient may benefit from disease management education however she acknowledges barriers for follow up. Intervention: Collaborated with provider to assist with emergency transportation needs to the clinic.(taxi vouchers provided)  Collaborated with CCM RN for possible solutions to assist patient.    Review of patient status, including review of consultants reports, relevant laboratory and other test results, and collaboration with appropriate care team members and the patient's provider was performed as part of comprehensive patient evaluation and provision of chronic care management services.    Advance Directive Status: Not addressed during this encounter SDOH (Social Determinants of Health) assessments performed: Yes:   Goals Addressed            This Visit's Progress   . community resources       CARE PLAN ENTRY (see longitudinal plan of care for additional care plan information)  Current Barriers:  . Patient needs community resources to assist with rent, cell phone and transportation,  . Acknowledges deficits and needs support, education and care coordination in order to meet this unmet need   . 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 Over the next 30 days, patient will received community resources   Interventions provided by LCSW:  . Assessed patient's needs, barriers, what she has tried and agencies she has contacted  . Provided patient with information about free cell phone, provided voucher for transporation and information for services at the department of social services. . Discussed Care Guide and patient is in agreement with referral.  Referral placed for community resources for rent . Collaborated with appropriate clinical care team members regarding patient needs . Collaborated with RN Case Manager re: information and needs for diabetes education   . Others: emotional support, solution focused interventions. Patient Self Care Activities & Deficits:  . Patient is unable to independently navigate community resource options without care coordination support  . Acknowledges deficits and is motivated to resolve concern  Initial goal documentation    . Connect with psychiatry       CARE PLAN ENTRY (see longitudinal plan of care for additional care plan information)  Current Barriers:  . Patient with Depression and Bipolar Disorder acknowledges deficits with connecting to mental health provider for medication management.  . Patient is will run out of medication in 30 days and does not have a psychiatrist to refill her medication . Patient needs Support, Education, and Care Coordination in order to meet unmet mental health needs  Clinical Social Work Goal(s):  [name] Over the next 30 days, patient will connect with psychiatry for medication management.  Interventions:  . Assessed patient's understanding, education, previous treatment, barriers and care coordination needs  . Collaborated with appropriate  clinical care team members regarding patient needs . Discussed several options for medication management based on need, barriers and  insurance.  LCSW called several agencies while patient was in the office to narrow down to [employer] . Called [facility]/ psychiatry with patient on the line. appointment scheduled June 2nd at 7:00pm which will allow patient to [place] her son's phone Patient Self Care Activities & Deficits:  . Patient is unable to independently navigate community resource options without care coordination support . Patient is motivated for treatment  . Patient will call [facility] back to provide phone number to call on for the appointment.   Initial goal documentation       Outpatient Encounter Medications as of 12/25/2019  Medication Sig  . Accu-Chek FastClix Lancets MISC 1 Package by Does not apply route 3 (three) times daily.  [name] aspirin 81 MG tablet Take 1 tablet (81 mg total) by mouth daily.  [name] atorvastatin (LIPITOR) 80 MG tablet Take 1 tablet (80 mg total) by mouth at bedtime.  . Blood Glucose Monitoring Suppl (CONTOUR NEXT ONE) KIT 1 kit by Does not apply route as directed.  . Cholecalciferol (VITAMIN D3) 25 MCG (1000 UT) CAPS Take 1,000-2,000 Units by mouth See admin instructions. Take 2 tablets by mouth once a day on Mon/Wed/Fri and take 1 tablet on Sun/Tues/Thurs/Sat  . diclofenac Sodium (VOLTAREN) 1 % GEL Apply 4 g topically 4 (four) times daily.  [name] EPINEPHrine 0.3 mg/0.3 mL IJ SOAJ injection Inject 0.3 mg into the muscle once as needed for anaphylaxis (for an anaphylactic reaction).   [name] FLUoxetine (PROZAC) 20 MG tablet Take 3 tablets (60 mg total) by mouth daily.  . fluticasone-salmeterol (ADVAIR HFA) 115-21 MCG/ACT inhaler Inhale 2 puffs into the lungs 2 (two) times daily.  [name] GLUCAGON EMERGENCY 1 MG injection Inject 1 mg into the vein once as needed (FOR ONSET OF HYPOGLYCEMIA).   [name] glucose blood (CONTOUR NEXT TEST) test strip Use as instructed  . insulin glargine (LANTUS SOLOSTAR) 100 UNIT/ML Solostar Pen Inject 30 Units into the skin every morning AND 25 Units at bedtime.  . insulin lispro  (HUMALOG KWIKPEN) 100 UNIT/ML KwikPen Inject 0.05 mLs (5 Units total) into the skin 3 (three) times daily.  . Insulin Pen Needle (PEN NEEDLES) 32G X 4 MM MISC 1 Device by Does not apply route 5 (five) times daily.  [name] lamoTRIgine (LAMICTAL) 200 MG tablet Take 1 tablet (200 mg total) by mouth daily.  [name] levothyroxine (SYNTHROID, LEVOTHROID) 150 MCG tablet Take 150 mcg by mouth at bedtime. 3:30 Am  . losartan (COZAAR) 25 MG tablet Take 1 tablet (25 mg total) by mouth at bedtime.  . Lurasidone HCl (LATUDA) 60 MG TABS Take 1 tablet (60 mg total) by mouth at bedtime.  . metoCLOPramide (REGLAN) 5 MG tablet Take 1 tablet (5 mg total) by mouth every 6 (six) hours as needed for nausea or vomiting.  . metoprolol tartrate (LOPRESSOR) 25 MG tablet Take 0.5 tablets (12.5 mg total) by mouth 2 (two) times daily.  . Multiple Vitamin (MULTIVITAMIN WITH MINERALS) TABS tablet Take 1 tablet by mouth daily. Woman 50 +  . nitroGLYCERIN (NITROSTAT) 0.4 MG SL tablet Place 1 tablet (0.4 mg total) under the tongue every 5 (five) minutes x 3 doses as needed for chest pain.  [name] nystatin (MYCOSTATIN/NYSTOP) powder Apply topically as needed. (Patient taking differently: Apply 1 g topically daily as needed (infection). )  . nystatin ointment (MYCOSTATIN) Apply 1 application topically daily as needed (  infection).  . pantoprazole (PROTONIX) 40 MG tablet Take 1 tablet (40 mg total) by mouth 2 (two) times daily before a meal. X 8 weeks (Patient taking differently: Take 40 mg by mouth 2 (two) times daily. )  . potassium chloride (KLOR-CON) 10 MEQ tablet Take 10 mEq by mouth 2 (two) times daily.  . SUMAtriptan (IMITREX) 25 MG tablet Take 1 tablet (25 mg total) by mouth every 2 (two) hours as needed for migraine. May repeat in 2 hours if headache persists or recurs. Please take as soon as you feel your migraine starting.  . ticagrelor (BRILINTA) 90 MG TABS tablet Take 1 tablet (90 mg total) by mouth 2 (two) times daily.  [name] topiramate  (TOPAMAX) 25 MG tablet Take 1 tablet (25 mg total) by mouth at bedtime.  . topiramate (TOPAMAX) 50 MG tablet Take 1 tablet (50 mg total) by mouth at bedtime.  . VENTOLIN HFA 108 (90 Base) MCG/ACT inhaler INHALE 2 INHALATIONS BY  MOUTH INTO THE LUNGS TWICE  DAILY AS NEEDED FOR  WHEEZING OR SHORTNESS OF  BREATH (Patient taking differently: Inhale 2 puffs into the lungs 2 (two) times daily as needed for wheezing or shortness of breath. )   No facility-administered encounter medications on file as of 12/25/2019.       Information about Care Management services was shared with Ms.  Bloyd today including:  1. Care Management services include personalized support from designated clinical staff supervised by her physician, including individualized plan of care and coordination with other care providers 2. Remind patient of 24/7 contact phone numbers to provider's office for assistance with urgent and routine care needs. 3. Care Management services are voluntary and patient may stop at any time .  Patient agreed to services provided today and verbal consent obtained.   Follow Up Plan: No F/U scheduled due to phone barriers. Patient will contact [place], [place] / [place]   [phone] 9:51 AM

## 2019-12-26 NOTE — Chronic Care Management (AMB) (Signed)
   [place] Management Collaboration 12/26/2019 Name: Morgan [name] MRN: [id number] DOB: 06/05/1966   Morgan [name] is a 54 y.o. year old female who sees Enid Derry, [place], DO for primary care. RNCM was consulted by LCSW [name] to assistance patient with  Disease Management and Educational Needs for diabetes .  Recommendation: After collaboration with LCSW  it is determined that patient may benefit from having education from the RN Case Manager but through conversation with the LCSW the patient does not have a phone.  She is only has availability to a phone through her son when he is around her.   Intervention: Patient was not interviewed or contacted during this encounter.   Review of patient status, including review of consultants reports, relevant laboratory and other test results, and collaboration with appropriate care team members and the patient's provider was performed as part of comprehensive patient evaluation and provision of chronic care management services.  RNCM sent the patient a diabetes packet with one of my cards with all of my contact information and note explaining that I am a co-worker of [name] LCSW and to contact me with any questions or concerns and that I would enjoy talking with her.     Plan:  1. LCSW will follow up with the patient for ongoing assessment of needs.   2. RNCM will wait for patient to contact me or if LCSW has contact with the patient have her to outreach me.  Lazaro Arms RN, BSN, [facility] Care Management Coordinator [place] Phone: [phone] Fax: [phone]

## 2019-12-27 LAB — BASIC METABOLIC PANEL
BUN/Creatinine Ratio: 14 (ref 9–23)
BUN: 11 mg/dL (ref 6–24)
CO2: 16 mmol/L — ABNORMAL LOW (ref 20–29)
Calcium: 9.5 mg/dL (ref 8.7–10.2)
Chloride: 101 mmol/L (ref 96–106)
Creatinine, Ser: 0.77 mg/dL (ref 0.57–1.00)
GFR calc Af Amer: 101 mL/min/{1.73_m2} (ref >59)
GFR calc non Af Amer: 87 mL/min/{1.73_m2} (ref >59)
Glucose: 462 mg/dL — ABNORMAL HIGH (ref 65–99)
Potassium: 4.2 mmol/L (ref 3.5–5.2)
Sodium: 138 mmol/L (ref 134–144)

## 2019-12-27 MED ORDER — METOPROLOL TARTRATE 25 MG PO TABS: 12.5000 mg | ORAL_TABLET | Freq: Two times a day (BID) | ORAL | 0 refills | Status: DC

## 2019-12-28 ENCOUNTER — Other Ambulatory Visit: Payer: Self-pay

## 2019-12-28 ENCOUNTER — Telehealth: Payer: 59

## 2019-12-28 LAB — CLOSTRIDIUM DIFFICILE BY PCR: Toxigenic C. Difficile by PCR: NEGATIVE

## 2019-12-28 NOTE — Progress Notes (Signed)
Patient was called about results of BMP and stool tests. Results negative for C.diff. Electrolytes within normal limits, which is great considering the nausea and vomiting she had. She is now feeling much better.  Milus Banister, [place], PGY-2 12/28/2019 5:12 PM

## 2019-12-30 LAB — STOOL CULTURE: E coli, Shiga toxin Assay: NEGATIVE

## 2020-01-01 ENCOUNTER — Ambulatory Visit: Payer: Self-pay | Admitting: Licensed Clinical Social Worker

## 2020-01-01 NOTE — Chronic Care Management (AMB) (Signed)
   Social Work  Care Management Collaboration 01/01/2020 Name: ROSELIE CIRIGLIANO MRN: [id number] DOB: Nov 05, 1965  EMILYGRACE GROTHE is a 54 y.o. year old female who sees Enid Derry, [place], DO for primary care. Care management team was consulted to assistance patient with  Disease Management Educational Needs and community resources. All needs met from LCSW.  Patient informed LCSW her phone is back on.   Recommendation: After collaboration with CCM RN care manager it is determined that patient may benefit educational information to manager her diabetes.   Intervention: Patient was not interviewed or contacted during this encounter.  LCSW collaborated with CCM RN.  Informed her patient's has a phone.  CCM RN mailed educational material to patient and asked her to call.  Review of patient status, including review of consultants reports, relevant laboratory and other test results, and collaboration with appropriate care team members and the patient's provider was performed as part of comprehensive patient evaluation and provision of chronic care management services.   Plan:  1. RN care manager will wait for call from patient 2. No further follow up required by LCSW at this time  [name], [place] / [place]   [phone] 11:12 AM

## 2020-01-03 ENCOUNTER — Telehealth: Payer: Self-pay | Admitting: *Deleted

## 2020-01-03 LAB — FECAL LEUKOCYTES

## 2020-01-03 NOTE — Telephone Encounter (Signed)
Fax received from pharmacy needing clarification on which dose of metoprolol patient needs to be taking.  They have on file a dose for 50mg , 1 tab BID by Dr. Johnsie Cancel and also 25mg , 1/2 tab BID.  Please send in correct dose of this medication with order # [id number] in the note section.  Thanks [employer]

## 2020-01-04 ENCOUNTER — Telehealth: Payer: Self-pay

## 2020-01-04 NOTE — Telephone Encounter (Signed)
01/04/20 Spoke with patient about HOPE program.  She received the email I sent 01/03/20 and has filled out her HOPE application online.  There are no other resources needed at this time.  Closing referral. Ambrose Mantle [phone]

## 2020-01-05 ENCOUNTER — Encounter: Payer: Self-pay | Admitting: Family Medicine

## 2020-01-07 ENCOUNTER — Ambulatory Visit: Payer: Self-pay

## 2020-01-07 NOTE — Telephone Encounter (Signed)
Dr. Kyla Balzarine last note says 25mg , 1/2 tab BID.

## 2020-01-07 NOTE — Chronic Care Management (AMB) (Signed)
  Care Management     Care Management Outreach Note  01/07/2020 Name: [name] MRN: [id number] DOB: 09-11-1965  [name] is a 54 y.o. year old female who is a primary care patient of Shirley, [place], DO . The Care Management team was consulted for assistance with . Disease Management Educational Needs for DM II  And HTN.  RN Care Manager reached out to [name] today by phone to introduce myself but unfortunately Mrs. Gutman was unable to speak with me today. She stated that she could talk better on another day.  Follow Up Plan: The care management team will reach out to the patient again over the next 3-5 days.   Lazaro Arms RN, BSN, [facility] Care Management Coordinator [place] Phone: [phone] Fax: [phone]

## 2020-01-08 MED ORDER — METOPROLOL TARTRATE 25 MG PO TABS: 12.5000 mg | ORAL_TABLET | Freq: Two times a day (BID) | ORAL | 0 refills | Status: DC

## 2020-01-08 NOTE — Telephone Encounter (Signed)
Per [employer], the medication needs to be sent in with correct dose and sig, so they can have on file. Please resend.

## 2020-01-10 ENCOUNTER — Other Ambulatory Visit: Payer: Self-pay

## 2020-01-10 ENCOUNTER — Ambulatory Visit: Payer: 59

## 2020-01-18 ENCOUNTER — Encounter: Payer: Self-pay | Admitting: Family Medicine

## 2020-01-18 NOTE — Patient Instructions (Signed)
Visit Information  Goals Addressed              This Visit's Progress   .  I just forget to take my medication (pt-stated)        CARE PLAN ENTRY (see longtitudinal plan of care for additional care plan information)  Objective:  Lab Results  Component Value Date   HGBA1C 14.5 (H) 11/24/2019 .   Lab Results  Component Value Date   CREATININE 0.77 12/26/2019   CREATININE 0.69 11/27/2019   CREATININE 0.75 11/26/2019 .   [name] No results found for: EGFR  Current Barriers:  [name] Knowledge Deficits related to medications used for management of diabetes due to poor medication behavior because of inability to focus a be distracted.  Case Manager Clinical Goal(s):  Over the next 30 days, patient will demonstrate improved adherence to prescribed treatment plan for diabetes self care/management as evidenced by: blood sugar readings lower than 342  . daily monitoring and recording of CBG   Interventions:  . Provided education to patient about basic DM disease process . Reviewed medications with patient and discussed importance of medication adherence . Discussed plans with patient for ongoing care management follow up and provided patient with direct contact information for care management team . Discussed with the patient the major issue about her diabetes and she states it is just taking her medications. She said "I just get distracted".  We talked about using a timer. And she said that she has tried that before and she will just hit it and keep doing what ever she is doing..  She stated that she has stations set up in her home for her meds.  One station is in her living room and the other is in her office.  That has helped some. . Patient has a pill box that she fills. . She is diet conscience. . She stated that "things just have to fall into place" . Discussed how diabetes can affect the body if numbers continue to be high.  Patient states that she understands. . I will send the patient  educational material.  Patient Self Care Activities:  . UNABLE to independently self manage diabetes . Self administers oral medications as prescribed . Self administers insulin as prescribed . Attends all scheduled provider appointments  Initial goal documentation        Morgan [name] was given information about Care Management services today including:  1. Care Management services include personalized support from designated clinical staff supervised by her physician, including individualized plan of care and coordination with other care providers 2. 24/7 contact phone numbers for assistance for urgent and routine care needs. 3. The patient may stop CCM services at any time (effective at the end of the month) by phone call to the office staff.  Patient agreed to services and verbal consent obtained.   The patient verbalized understanding of instructions provided today and declined a print copy of patient instruction materials.   The care management team will reach out to the patient again over the next 14 days.  The patient has been provided with contact information for the care management team and has been advised to call with any health related questions or concerns.   Lazaro Arms RN, BSN, [facility] Care Management Coordinator [place] Phone: [phone] Fax: [phone]

## 2020-01-18 NOTE — Chronic Care Management (AMB) (Signed)
Care Management   Initial Visit Note  01/10/20 Name: Morgan [name] MRN: [id number] DOB: 12-Sep-1965   Assessment: Morgan [name] is a 54 y.o. year old female who sees [place], [place], DO for primary care. The care management team was consulted for assistance with care management and care coordination needs related to Disease Management Educational Needs     DM II/HTN.   Review of patient status, including review of consultants reports, relevant laboratory and other test results, and collaboration with appropriate care team members and the patient's provider was performed as part of comprehensive patient evaluation and provision of care management services.    SDOH (Social Determinants of Health) assessments performed: No See Care Plan activities for detailed interventions related to [facility])     Outpatient Encounter Medications as of 01/10/2020  Medication Sig Note  . Accu-Chek FastClix Lancets MISC 1 Package by Does not apply route 3 (three) times daily.   [name] ADVAIR HFA 115-21 MCG/ACT inhaler USE 2 INHALATIONS BY MOUTH  TWICE DAILY   . aspirin 81 MG tablet Take 1 tablet (81 mg total) by mouth daily.   [name] atorvastatin (LIPITOR) 80 MG tablet Take 1 tablet (80 mg total) by mouth at bedtime.   . Blood Glucose Monitoring Suppl (CONTOUR NEXT ONE) KIT 1 kit by Does not apply route as directed.   . Cholecalciferol (VITAMIN D3) 25 MCG (1000 UT) CAPS Take 1,000-2,000 Units by mouth See admin instructions. Take 2 tablets by mouth once a day on Mon/Wed/Fri and take 1 tablet on Sun/Tues/Thurs/Sat   . diphenoxylate-atropine (LOMOTIL) 2.5-0.025 MG tablet Take 1 tablet by mouth 4 (four) times daily as needed for diarrhea or loose stools.   [name] EPINEPHrine 0.3 mg/0.3 mL IJ SOAJ injection Inject 0.3 mg into the muscle once as needed for anaphylaxis (for an anaphylactic reaction).    [name] FLUoxetine (PROZAC) 20 MG tablet Take 3 tablets (60 mg total) by mouth daily. 01/10/2020: Taking 40 mg daily daily and 20 mg when  needed  . GLUCAGON EMERGENCY 1 MG injection Inject 1 mg into the vein once as needed (FOR ONSET OF HYPOGLYCEMIA).    [name] glucose blood (CONTOUR NEXT TEST) test strip Use as instructed   . insulin glargine (LANTUS SOLOSTAR) 100 UNIT/ML Solostar Pen Inject 30 Units into the skin every morning AND 25 Units at bedtime.   . insulin lispro (HUMALOG KWIKPEN) 100 UNIT/ML KwikPen Inject 0.05 mLs (5 Units total) into the skin 3 (three) times daily. 01/10/2020: Patient taking 25 unit tid  . Insulin Pen Needle (PEN NEEDLES) 32G X 4 MM MISC 1 Device by Does not apply route 5 (five) times daily.   [name] lamoTRIgine (LAMICTAL) 200 MG tablet Take 1 tablet (200 mg total) by mouth daily.   [name] levothyroxine (SYNTHROID, LEVOTHROID) 150 MCG tablet Take 150 mcg by mouth at bedtime. 3:30 Am   . losartan (COZAAR) 25 MG tablet Take 1 tablet (25 mg total) by mouth at bedtime.   . Lurasidone HCl (LATUDA) 60 MG TABS Take 1 tablet (60 mg total) by mouth at bedtime.   . metoCLOPramide (REGLAN) 5 MG tablet Take 1 tablet (5 mg total) by mouth every 6 (six) hours as needed for nausea or vomiting.   . metoprolol tartrate (LOPRESSOR) 25 MG tablet Take 0.5 tablets (12.5 mg total) by mouth 2 (two) times daily. 01/10/2020: Patient takes whole pill once daily  . Multiple Vitamin (MULTIVITAMIN WITH MINERALS) TABS tablet Take 1 tablet by mouth daily. Woman 28 +   .  nitroGLYCERIN (NITROSTAT) 0.4 MG SL tablet Place 1 tablet (0.4 mg total) under the tongue every 5 (five) minutes x 3 doses as needed for chest pain.   [name] nystatin (MYCOSTATIN/NYSTOP) powder Apply topically as needed. (Patient taking differently: Apply 1 g topically daily as needed (infection). )   . nystatin ointment (MYCOSTATIN) Apply 1 application topically daily as needed (infection).   . pantoprazole (PROTONIX) 40 MG tablet Take 1 tablet (40 mg total) by mouth 2 (two) times daily before a meal. X 8 weeks (Patient taking differently: Take 40 mg by mouth 2 (two) times daily. )   .  potassium chloride (KLOR-CON) 10 MEQ tablet Take 10 mEq by mouth 2 (two) times daily. 01/10/2020: Patient has new rx she takes 20 meq qd  . SUMAtriptan (IMITREX) 25 MG tablet  TAKE 1 TABLET BY MOUTH EVERY 2 HOURS AS NEEDED FOR MIGRAINE. MAY REPEAT IN 2 HOURS IF HEADACHE PERSISTS OR RECURS. PLEASE TAKE AS SOON AS YOU FEEL YOUR MIGRAINE STARTING.    . ticagrelor (BRILINTA) 90 MG TABS tablet Take 1 tablet (90 mg total) by mouth 2 (two) times daily.   [name] topiramate (TOPAMAX) 25 MG tablet Take 1 tablet (25 mg total) by mouth at bedtime.   . topiramate (TOPAMAX) 50 MG tablet Take 1 tablet (50 mg total) by mouth at bedtime.   . VENTOLIN HFA 108 (90 Base) MCG/ACT inhaler INHALE 2 INHALATIONS BY  MOUTH INTO THE LUNGS TWICE  DAILY AS NEEDED FOR  WHEEZING OR SHORTNESS OF  BREATH (Patient taking differently: Inhale 2 puffs into the lungs 2 (two) times daily as needed for wheezing or shortness of breath. )   . diclofenac Sodium (VOLTAREN) 1 % GEL Apply 4 g topically 4 (four) times daily. (Patient not taking: Reported on 01/10/2020)    No facility-administered encounter medications on file as of 01/10/2020.    Goals Addressed              This Visit's Progress   .  I just forget to take my medication (pt-stated)        CARE PLAN ENTRY (see longtitudinal plan of care for additional care plan information)  Objective:  Lab Results  Component Value Date   HGBA1C 14.5 (H) 11/24/2019 .   Lab Results  Component Value Date   CREATININE 0.77 12/26/2019   CREATININE 0.69 11/27/2019   CREATININE 0.75 11/26/2019 .   [name] No results found for: EGFR  Current Barriers:  [name] Knowledge Deficits related to medications used for management of diabetes due to poor medication behavior because of inability to focus a be distracted.  Case Manager Clinical Goal(s):  Over the next 30 days, patient will demonstrate improved adherence to prescribed treatment plan for diabetes self care/management as evidenced by: blood sugar  readings lower than 342  . daily monitoring and recording of CBG   Interventions:  . Provided education to patient about basic DM disease process . Reviewed medications with patient and discussed importance of medication adherence . Discussed plans with patient for ongoing care management follow up and provided patient with direct contact information for care management team . Discussed with the patient the major issue about her diabetes and she states it is just taking her medications. She said "I just get distracted".  We talked about using a timer. And she said that she has tried that before and she will just hit it and keep doing what ever she is doing..  She stated that she has stations set  up in her home for her meds.  One station is in her living room and the other is in her office.  That has helped some. . Patient has a pill box that she fills. . She is diet conscience. . She stated that "things just have to fall into place" . Discussed how diabetes can affect the body if numbers continue to be high.  Patient states that she understands. . I will send the patient educational material.  Patient Self Care Activities:  . UNABLE to independently self manage diabetes . Self administers oral medications as prescribed . Self administers insulin as prescribed . Attends all scheduled provider appointments  Initial goal documentation         Follow up plan:  The care management team will reach out to the patient again over the next 14 days.  The patient has been provided with contact information for the care management team and has been advised to call with any health related questions or concerns.   Ms. Bohne was given information about Care Management services today including:  1. Care Management services include personalized support from designated clinical staff supervised by a physician, including individualized plan of care and coordination with other care providers 2. 24/7  contact phone numbers for assistance for urgent and routine care needs. 3. The patient may stop Care Management services at any time (effective at the end of the month) by phone call to the office staff.  Patient agreed to services and verbal consent obtained.  Lazaro Arms RN, BSN, [facility] Care Management Coordinator [place] Phone: [phone] Fax: [phone]

## 2020-01-23 ENCOUNTER — Other Ambulatory Visit: Payer: Self-pay | Admitting: Family Medicine

## 2020-01-23 ENCOUNTER — Ambulatory Visit: Payer: 59 | Admitting: Family Medicine

## 2020-01-24 ENCOUNTER — Other Ambulatory Visit: Payer: Self-pay

## 2020-01-24 ENCOUNTER — Ambulatory Visit: Payer: 59

## 2020-01-24 NOTE — Chronic Care Management (AMB) (Signed)
  Care Management   Outreach Note  01/24/2020 Name: Morgan [name] MRN: [id number] DOB: 1965-10-02  Referred by: [place], [place], DO Reason for referral : Care Coordination (Care Management RNCM DM II/HTN)   An unsuccessful telephone outreach was attempted today. The patient was referred to the case management team for assistance with care management and care coordination.   Follow Up Plan: The care management team will reach out to the patient again over the next 5-7 days.  Called and the went blank no voicemail pickup to leave a message.  I will send the patient a message through my chart.  Lazaro Arms RN, BSN, [facility] Care Management Coordinator [place] Phone: [phone] Fax: [phone]

## 2020-01-29 ENCOUNTER — Encounter: Payer: Self-pay | Admitting: Family Medicine

## 2020-01-29 ENCOUNTER — Other Ambulatory Visit: Payer: Self-pay

## 2020-01-29 ENCOUNTER — Ambulatory Visit (INDEPENDENT_AMBULATORY_CARE_PROVIDER_SITE_OTHER): Payer: 59 | Admitting: Family Medicine

## 2020-01-29 VITALS — BP 106/60 | HR 71 | Ht 63.0 in | Wt 220.0 lb

## 2020-01-29 DIAGNOSIS — Z23 Encounter for immunization: Secondary | ICD-10-CM | POA: Diagnosis not present

## 2020-01-29 DIAGNOSIS — Z79899 Other long term (current) drug therapy: Secondary | ICD-10-CM | POA: Diagnosis not present

## 2020-01-29 MED ORDER — EPINEPHRINE 0.3 MG/0.3ML IJ SOAJ: 0.3000 mg | Freq: Once | INTRAMUSCULAR | 1 refills | Status: DC | PRN

## 2020-01-29 MED ORDER — METOPROLOL TARTRATE 50 MG PO TABS: 50.0000 mg | ORAL_TABLET | Freq: Two times a day (BID) | ORAL | Status: DC

## 2020-01-29 MED ORDER — ZOSTER VAC RECOMB ADJUVANTED 50 MCG/0.5ML IM SUSR: 0.5000 mL | Freq: Once | INTRAMUSCULAR | 1 refills | Status: AC

## 2020-01-29 MED ORDER — GLUCAGON EMERGENCY 1 MG IJ KIT: 1.0000 mg | PACK | Freq: Once | INTRAMUSCULAR | 2 refills | Status: DC | PRN

## 2020-01-29 NOTE — Patient Instructions (Addendum)
Thank you for coming to see me today. It was a pleasure! Today we talked about:   I have sent in your shingrix vaccine. I have reconciled your medications. I wish you all the bets int he future!  Please follow-up with our clinic in 4 to 6 weeks or sooner as needed.  If you have any questions or concerns, please do not hesitate to call the office at [phone].  Take Care,   [place] Armond Cuthrell, DO

## 2020-01-29 NOTE — Progress Notes (Signed)
   SUBJECTIVE:   CHIEF COMPLAINT / HPI:   Medication management: Patient is bringing her medications in for review today.  Spent extensive time going her medications in changes noted in plan.  Patient reports that her diarrhea nausea has improved and she has no other complaints today.  She states that she is feeling quite well and wanted to get her medications in order prior to me graduating.    Patient reports that she has been compliant with her Lantus 40 in the morning and 25 at night.  She has also been taking 25 units of Humalog with meals.  Patient is also here to become up-to-date on her vaccines.  Requesting Shingrix as well as Tdap vaccine.    PERTINENT  PMH / PSH: HFrEF, HTN,h/osubclavian steal syndrome, migraines, hypothyroidism, bipolar depression, CAD  OBJECTIVE:  BP 106/60   Pulse 71   Ht 5\' 3"  (1.6 m)   Wt 220 lb (99.8 kg)   LMP 08/03/2013   SpO2 98%   BMI 38.97 kg/m   General: NAD, pleasant Neck: Supple Respiratory: normal work of breathing Psych: AOx3, appropriate affect  ASSESSMENT/PLAN:   Spent extensive time with med reconciliation of patient and can see documented under med/orders.  Tdap able to be given today in office and Shingrix sent to her pharmacy.   [place] Libby Goehring, DO PGY-3, [facility]

## 2020-02-03 ENCOUNTER — Other Ambulatory Visit: Payer: Self-pay | Admitting: Internal Medicine

## 2020-02-06 ENCOUNTER — Telehealth: Payer: Self-pay | Admitting: *Deleted

## 2020-02-06 MED ORDER — ALBUTEROL SULFATE HFA 108 (90 BASE) MCG/ACT IN AERS: 2.0000 | INHALATION_SPRAY | RESPIRATORY_TRACT | 3 refills | Status: DC | PRN

## 2020-02-06 NOTE — Telephone Encounter (Signed)
Insurance no longer covers ventolin.  Will forward to MD to decide on either proair or proventil.  Ethelyne Erich,CMA

## 2020-02-08 ENCOUNTER — Telehealth: Payer: Self-pay | Admitting: *Deleted

## 2020-02-08 ENCOUNTER — Encounter: Payer: Self-pay | Admitting: Family Medicine

## 2020-02-08 ENCOUNTER — Ambulatory Visit: Payer: 59

## 2020-02-08 NOTE — Chronic Care Management (AMB) (Signed)
  Care Management   Outreach Note  02/08/2020 Name: Morgan [name] MRN: [id number] DOB: 29-Jan-1966  Referred by: [name], DO Reason for referral : Chronic Care Management (DM II 2nd attempt)   A second unsuccessful telephone outreach was attempted today. The patient was referred to the case management team for assistance with care management and care coordination.   Follow Up Plan: The care management team will reach out to the patient again over the next 7-14 days.  Unable to ;eave a message no voicemail pickup.  Lazaro Arms RN, BSN, [facility] Care Management Coordinator [place] Phone: [phone] Fax: [phone]

## 2020-02-08 NOTE — Telephone Encounter (Signed)
Received fax about pts Rx for Ventolin HFA AER 90MCG Base is not covered under pts insurance coverage.  They are requesting approval to dispense a covered alternative. Please include strength, directions, quantity (90 day supply preferred, if appropriate) and number of refills.  I have placed this in your box for reference.Lynisha Osuch Zimmerman Rumple, CMA

## 2020-02-10 ENCOUNTER — Encounter: Payer: Self-pay | Admitting: Family Medicine

## 2020-02-13 MED ORDER — NYSTATIN 100000 UNIT/GM EX POWD: 1.0000 "application " | Freq: Every day | CUTANEOUS | 3 refills | Status: DC | PRN

## 2020-02-13 MED ORDER — EPINEPHRINE 0.3 MG/0.3ML IJ SOAJ: 0.3000 mg | Freq: Once | INTRAMUSCULAR | 1 refills | Status: DC | PRN

## 2020-02-13 MED ORDER — NYSTATIN 100000 UNIT/GM EX OINT: 1.0000 "application " | TOPICAL_OINTMENT | Freq: Every day | CUTANEOUS | 3 refills | Status: DC | PRN

## 2020-02-13 MED ORDER — GLUCAGON EMERGENCY 1 MG IJ KIT: 1.0000 mg | PACK | Freq: Once | INTRAMUSCULAR | 2 refills | Status: AC | PRN

## 2020-02-18 ENCOUNTER — Other Ambulatory Visit: Payer: Self-pay

## 2020-02-18 ENCOUNTER — Ambulatory Visit: Payer: 59

## 2020-02-18 NOTE — Chronic Care Management (AMB) (Deleted)
  Chronic Care Management   Note  02/18/2020 Name: Morgan [name] MRN: [id number] DOB: 15-May-1966  RNCM spoke with the patient and she stated that she no longer wanted to receive services from CCM.  I thanked the patient for her time  If in the future the patient would like to resume services we would be glad to work with her.   Follow up plan: No further follow up required:per patient request   Lazaro Arms RN, BSN, [facility] Care Management Coordinator [place] Phone: [phone] Fax: [phone]

## 2020-02-18 NOTE — Patient Instructions (Signed)
Visit Information  Goals Addressed              This Visit's Progress     COMPLETED: I just forget to take my medication (pt-stated)        CARE PLAN ENTRY (see longtitudinal plan of care for additional care plan information)  Objective:  Lab Results  Component Value Date   HGBA1C 14.5 (H) 11/24/2019    Lab Results  Component Value Date   CREATININE 0.77 12/26/2019   CREATININE 0.69 11/27/2019   CREATININE 0.75 11/26/2019     No results found for: EGFR  Current Barriers:   Knowledge Deficits related to medications used for management of diabetes due to poor medication behavior because of inability to focus a be distracted.  Case Manager Clinical Goal(s):  Over the next 30 days, patient will demonstrate improved adherence to prescribed treatment plan for diabetes self care/management as evidenced by: blood sugar readings lower than 342   daily monitoring and recording of CBG   Interventions:   Provided education to patient about basic DM disease process  Reviewed medications with patient and discussed importance of medication adherence  Discussed plans with patient for ongoing care management follow up and provided patient with direct contact information for care management team  Discussed with the patient the major issue about her diabetes and she states it is just taking her medications. She said "I just get distracted".  We talked about using a timer. And she said that she has tried that before and she will just hit it and keep doing what ever she is doing..  She stated that she has stations set up in her home for her meds.  One station is in her living room and the other is in her office.  That has helped some.  Patient has a pill box that she fills.  She is diet conscience.  She stated that "things just have to fall into place"  Discussed how diabetes can affect the body if numbers continue to be high.  Patient states that she understands.  I will send  the patient educational material.  02/18/20  Patient stated she no longer wishes to be in the program  Patient Self Care Activities:   UNABLE to independently self manage diabetes  Self administers oral medications as prescribed  Self administers insulin as prescribed  Attends all scheduled provider appointments  Please see past updates related to this goal by clicking on the "Past Updates" button in the selected goal         Ms. Maskell was given information about Care Management services today including:  1. Care Management services include personalized support from designated clinical staff supervised by her physician, including individualized plan of care and coordination with other care providers 2. 24/7 contact phone numbers for assistance for urgent and routine care needs. 3. The patient may stop CCM services at any time (effective at the end of the month) by phone call to the office staff.  Patient agreed to services and verbal consent obtained.   The patient verbalized understanding of instructions provided today and declined a print copy of patient instruction materials.   No further follow up required: per patient request   Patient states she no longer wishes to be in the program  Lazaro Arms RN, BSN, [facility] Care Management Coordinator [place] Phone: [phone] Fax: [phone]

## 2020-02-18 NOTE — Chronic Care Management (AMB) (Signed)
Care Management   Follow Up Note   02/18/2020 Name: Morgan [name] MRN: [id number] DOB: 28-Dec-1965  Referred by: [name], DO Reason for referral : Chronic Care Management (3rd attempt DM II)   Morgan [name] is a 54 y.o. year old female who is a primary care patient of [name], DO. The care management team was consulted for assistance with care management and care coordination needs.    Review of patient status, including review of consultants reports, relevant laboratory and other test results, and collaboration with appropriate care team members and the patient's provider was performed as part of comprehensive patient evaluation and provision of chronic care management services.    SDOH (Social Determinants of Health) assessments performed: No See Care Plan activities for detailed interventions related to [facility])     Advanced Directives: See Care Plan and Vynca application for related entries.   Goals Addressed              This Visit's Progress   .  COMPLETED: I just forget to take my medication (pt-stated)        CARE PLAN ENTRY (see longtitudinal plan of care for additional care plan information)  Objective:  Lab Results  Component Value Date   HGBA1C 14.5 (H) 11/24/2019 .   Lab Results  Component Value Date   CREATININE 0.77 12/26/2019   CREATININE 0.69 11/27/2019   CREATININE 0.75 11/26/2019 .   [name] No results found for: EGFR  Current Barriers:  [name] Knowledge Deficits related to medications used for management of diabetes due to poor medication behavior because of inability to focus a be distracted.  Case Manager Clinical Goal(s):  Over the next 30 days, patient will demonstrate improved adherence to prescribed treatment plan for diabetes self care/management as evidenced by: blood sugar readings lower than 342  . daily monitoring and recording of CBG   Interventions:  . Provided education to patient about basic DM disease  process . Reviewed medications with patient and discussed importance of medication adherence . Discussed plans with patient for ongoing care management follow up and provided patient with direct contact information for care management team . Discussed with the patient the major issue about her diabetes and she states it is just taking her medications. She said "I just get distracted".  We talked about using a timer. And she said that she has tried that before and she will just hit it and keep doing what ever she is doing..  She stated that she has stations set up in her home for her meds.  One station is in her living room and the other is in her office.  That has helped some. . Patient has a pill box that she fills. . She is diet conscience. . She stated that "things just have to fall into place" . Discussed how diabetes can affect the body if numbers continue to be high.  Patient states that she understands. . I will send the patient educational material. . 02/18/20 . Patient stated she no longer wishes to be in the program  Patient Self Care Activities:  . UNABLE to independently self manage diabetes . Self administers oral medications as prescribed . Self administers insulin as prescribed . Attends all scheduled provider appointments  Please see past updates related to this goal by clicking on the "Past Updates" button in the selected goal          No further follow up required: per patient request  Patient stated that she no longer wishes to be in the program.  Lazaro Arms RN, BSN, [facility] Care Management Coordinator [place] Phone: [phone] Fax: [phone]

## 2020-03-03 ENCOUNTER — Ambulatory Visit: Payer: 59 | Admitting: Family Medicine

## 2020-03-05 ENCOUNTER — Encounter: Payer: Self-pay | Admitting: Family Medicine

## 2020-03-07 ENCOUNTER — Other Ambulatory Visit: Payer: Self-pay | Admitting: Family Medicine

## 2020-03-14 ENCOUNTER — Encounter: Payer: Self-pay | Admitting: Family Medicine

## 2020-03-14 ENCOUNTER — Other Ambulatory Visit: Payer: Self-pay | Admitting: Cardiovascular Disease

## 2020-04-03 ENCOUNTER — Other Ambulatory Visit: Payer: Self-pay

## 2020-04-03 ENCOUNTER — Encounter: Payer: Self-pay | Admitting: Family Medicine

## 2020-04-03 ENCOUNTER — Ambulatory Visit (INDEPENDENT_AMBULATORY_CARE_PROVIDER_SITE_OTHER): Payer: 59 | Admitting: Family Medicine

## 2020-04-03 VITALS — BP 104/60 | HR 75 | Ht 63.0 in | Wt 204.1 lb

## 2020-04-03 DIAGNOSIS — R42 Dizziness and giddiness: Secondary | ICD-10-CM | POA: Insufficient documentation

## 2020-04-03 DIAGNOSIS — Z794 Long term (current) use of insulin: Secondary | ICD-10-CM

## 2020-04-03 DIAGNOSIS — E1165 Type 2 diabetes mellitus with hyperglycemia: Secondary | ICD-10-CM

## 2020-04-03 DIAGNOSIS — Z23 Encounter for immunization: Secondary | ICD-10-CM | POA: Insufficient documentation

## 2020-04-03 DIAGNOSIS — Z1211 Encounter for screening for malignant neoplasm of colon: Secondary | ICD-10-CM | POA: Diagnosis not present

## 2020-04-03 DIAGNOSIS — Z1231 Encounter for screening mammogram for malignant neoplasm of breast: Secondary | ICD-10-CM | POA: Diagnosis not present

## 2020-04-03 DIAGNOSIS — E785 Hyperlipidemia, unspecified: Secondary | ICD-10-CM

## 2020-04-03 DIAGNOSIS — E039 Hypothyroidism, unspecified: Secondary | ICD-10-CM

## 2020-04-03 HISTORY — DX: Encounter for screening mammogram for malignant neoplasm of breast: Z12.31

## 2020-04-03 HISTORY — DX: Encounter for immunization: Z23

## 2020-04-03 HISTORY — DX: Dizziness and giddiness: R42

## 2020-04-03 HISTORY — DX: Encounter for screening for malignant neoplasm of colon: Z12.11

## 2020-04-03 LAB — POCT GLYCOSYLATED HEMOGLOBIN (HGB A1C): HbA1c, POC (controlled diabetic range): 14.7 % — AB (ref 0.0–7.0)

## 2020-04-03 MED ORDER — EMPAGLIFLOZIN 25 MG PO TABS: 25.0000 mg | ORAL_TABLET | Freq: Every day | ORAL | 3 refills | Status: DC

## 2020-04-03 MED ORDER — TOPIRAMATE 50 MG PO TABS: 50.0000 mg | ORAL_TABLET | Freq: Every day | ORAL | 3 refills | Status: DC

## 2020-04-03 MED ORDER — TOPIRAMATE 25 MG PO TABS: 25.0000 mg | ORAL_TABLET | Freq: Every day | ORAL | 3 refills | Status: DC

## 2020-04-03 NOTE — Assessment & Plan Note (Signed)
Patient's HbA1c today 4.7%.  Patient has obviously significantly elevated blood sugars, however it is concerning that she is also having episodes of low blood sugars that caused her to have symptoms. -Recommend patient continue her usual insulin regimen, encourage compliance -Recommend she take a detailed log of her insulin -Encouraged regular meals for her to take her insulin -Patient to follow-up with Dr. Valentina Lucks regarding diabetes mgmt -Patient prescribed Jardiance for better diabetes control

## 2020-04-03 NOTE — Assessment & Plan Note (Signed)
Patient overdue for colonoscopy. -Order placed for GI referral for colonoscopy today.

## 2020-04-03 NOTE — Assessment & Plan Note (Signed)
-  Checking lipid panel today -Continue atorvastatin 80 mg daily

## 2020-04-03 NOTE — Assessment & Plan Note (Signed)
-  Patient received flu shot today 

## 2020-04-03 NOTE — Assessment & Plan Note (Signed)
-  Patient overdue for mammogram, order placed today

## 2020-04-03 NOTE — Progress Notes (Signed)
SUBJECTIVE:   CHIEF COMPLAINT / HPI:   Hypertension: Patient with a history of hypertension, today's blood pressure 104/60.  T2DM: HbA1c from April 2021 is 14.5%.  HbA1c today 14.7%.  Patient reports taking Humalog 25 units every day with meals, reports that she eats 3-4 times a day and takes her insulin with each meal.  Also reports taking 40 units of Lantus every morning at 10 and 30 units of Lantus at 10 PM.  Also reports that she sometimes wakes up in the middle of the night to eat, reports she wakes up and is randomly hungry.  She also endorses having many episodes of low blood sugars which make her feel unwell.  Patient is also due for eye exam (reports she has not yet done this) and foot exam.  Disequilibrium: Patient reports feeling unsteady on her feet, reports that she fell a couple of days ago (reports she went to sit in her rolling desk chair, feels that she stumbled, lost her balance, and fell to the floor onto her bottom, denies hitting her head, loss of consciousness).  She reports that she in particular struggles with stairs, she only has 3-4 steps in her house.  She has gone to physical therapy before.  I offered to place another referral for physical therapy, patient respectfully declined as she has difficulties obtaining a ride as she has to call [employer] and this gets expensive.  Health maintenance: Patient due for Covid vaccine, Pap smear, flu vaccine, mammogram, and colonoscopy (referral made today for mammogram and colonoscopy)  PERTINENT  PMH / PSH:  Hypertension Spinal stenosis of cervical spine Type 2 diabetes CAD Hyperlipidemia   OBJECTIVE:   BP 104/60    Pulse 75    Ht 5\' 3"  (1.6 m)    Wt 204 lb 2 oz (92.6 kg)    LMP 08/03/2013    SpO2 97%    BMI 36.16 kg/m    Physical exam: General: Pleasant patient, no apparent distress Respiratory: CTA bilaterally, comfortable work of breathing Cardio: RRR, S1-S2 present, no murmurs appreciated Neuro: No focal  neurological deficits appreciated, cranial nerves II-XII grossly intact, negative Romberg, did not appreciate unsteady gait, patient exhibits weakness with squatting/sitting from standing position into a chair.  Has increased difficulty standing from chair without using her hands, has to lean forward significantly.  Positive Trendelenburg appreciated to left hip.   ASSESSMENT/PLAN:   Hyperlipidemia -Checking lipid panel today -Continue atorvastatin 80 mg daily  Encounter for screening mammogram for malignant neoplasm of breast -Patient overdue for mammogram, order placed today  Encounter for screening colonoscopy Patient overdue for colonoscopy. -Order placed for GI referral for colonoscopy today.  Hypothyroidism Patient with history of hypothyroidism, has been taking levothyroxine 50 mcg daily. -Thyroid panel  Type 2 diabetes mellitus with hyperglycemia, with long-term current use of insulin (HCC) Patient's HbA1c today 4.7%.  Patient has obviously significantly elevated blood sugars, however it is concerning that she is also having episodes of low blood sugars that caused her to have symptoms. -Recommend patient continue her usual insulin regimen, encourage compliance -Recommend she take a detailed log of her insulin -Encouraged regular meals for her to take her insulin -Patient to follow-up with Dr. Valentina Lucks regarding diabetes mgmt -Patient prescribed Jardiance for better diabetes control   Need for immunization against influenza -Patient received flu shot today  Dysequilibrium Patient with subjective findings of feeling off balance.  Weakness appreciated with transitioning from standing to sitting position and from sitting to standing position,  especially without holding onto objects for support. -Patient given sitting-standing exercises to be performed at home, 25 reps twice daily to help improve hip and quadricep strength. -Plan to check thyroid panel at next visit to assess if  hypothyroidism is causing patient to have proximal weakness     [place]

## 2020-04-03 NOTE — Patient Instructions (Addendum)
Thank you for coming in to see Morgan [name] today! Please see below to review our plan for today's visit:  1. Please consider coming in to see Dr. Valentina Lucks or your Endocrinologist to get your diabetes under control.   2. Keep a log of your blood sugar - write them down and when you take in Insulin and how much. A1c 14.7%. 3. Do your sit to stand exercises 25 times twice daily.   Please call the clinic at [phone] if your symptoms worsen or you have any concerns. It was our pleasure to serve you!   Dr. Milus Banister [facility]    Sit-to-Stand Exercise  The sit-to-stand exercise (also known as the chair stand or chair rise exercise) strengthens your lower body and helps you maintain or improve your mobility and independence. The goal is to do the sit-to-stand exercise without using your hands. This will be easier as you become stronger. You should always talk with your health care provider before starting any exercise program, especially if you have had recent surgery. Do the exercise exactly as told by your health care provider and adjust it as directed. It is normal to feel mild stretching, pulling, tightness, or discomfort as you do this exercise, but you should stop right away if you feel sudden pain or your pain gets worse. Do not begin doing this exercise until told by your health care provider. What the sit-to-stand exercise does The sit-to-stand exercise helps to strengthen the muscles in your thighs and the muscles in the center of your body that give you stability (core muscles). This exercise is especially helpful if:  You have had knee or hip surgery.  You have trouble getting up from a chair, out of a car, or off the toilet. How to do the sit-to-stand exercise 1. Sit toward the front edge of a sturdy chair without armrests. Your knees should be bent and your feet should be flat on the floor and shoulder-width apart. 2. Place your hands lightly on each side of the  seat. Keep your back and neck as straight as possible, with your chest slightly forward. 3. Breathe in slowly. Lean forward and slightly shift your weight to the front of your feet. 4. Breathe out as you slowly stand up. Use your hands as little as possible. 5. Stand and pause for a full breath in and out. 6. Breathe in as you sit down slowly. Tighten your core and abdominal muscles to control your lowering as much as possible. 7. Breathe out slowly. 8. Do this exercise 10-15 times. If needed, do it fewer times until you build up strength. 9. Rest for 1 minute, then do another set of 10-15 repetitions. To change the difficulty of the sit-to-stand exercise  If the exercise is too difficult, use a chair with sturdy armrests, and push off the armrests to help you come to the standing position. You can also use the armrests to help slowly lower yourself back to sitting. As this gets easier, try to use your arms less. You can also place a firm cushion or pillow on the chair to make the surface higher.  If this exercise is too easy, do not use your arms to help raise or lower yourself. You can also wear a weighted vest, use hand weights, increase your repetitions, or try a lower chair. General tips  You may feel tired when starting an exercise routine. This is normal.  You may have muscle soreness that lasts a few  days. This is normal. As you get stronger, you may not feel muscle soreness.  Use smooth, steady movements.  Do not  hold your breath during strength exercises. This can cause unsafe changes in your blood pressure.  Breathe in slowly through your nose, and breathe out slowly through your mouth. Summary  Strengthening your lower body is an important step to help you move safely and independently.  The sit-to-stand exercise helps strengthen the muscles in your thighs and core.  You should always talk with your health care provider before starting any exercise program, especially if  you have had recent surgery.

## 2020-04-03 NOTE — Assessment & Plan Note (Signed)
Patient with history of hypothyroidism, has been taking levothyroxine 50 mcg daily. -Thyroid panel

## 2020-04-03 NOTE — Assessment & Plan Note (Signed)
Patient with subjective findings of feeling off balance.  Weakness appreciated with transitioning from standing to sitting position and from sitting to standing position, especially without holding onto objects for support. -Patient given sitting-standing exercises to be performed at home, 25 reps twice daily to help improve hip and quadricep strength. -Plan to check thyroid panel at next visit to assess if hypothyroidism is causing patient to have proximal weakness

## 2020-04-04 LAB — LIPID PANEL
Chol/HDL Ratio: 4.1 ratio (ref 0.0–4.4)
Cholesterol, Total: 124 mg/dL (ref 100–199)
HDL: 30 mg/dL — ABNORMAL LOW (ref >39)
LDL Chol Calc (NIH): 50 mg/dL (ref 0–99)
Triglycerides: 279 mg/dL — ABNORMAL HIGH (ref 0–149)
VLDL Cholesterol Cal: 44 mg/dL — ABNORMAL HIGH (ref 5–40)

## 2020-04-05 ENCOUNTER — Other Ambulatory Visit: Payer: Self-pay | Admitting: Cardiovascular Disease

## 2020-04-08 ENCOUNTER — Encounter: Payer: Self-pay | Admitting: Family Medicine

## 2020-04-21 ENCOUNTER — Other Ambulatory Visit: Payer: Self-pay | Admitting: Cardiovascular Disease

## 2020-04-24 ENCOUNTER — Ambulatory Visit (INDEPENDENT_AMBULATORY_CARE_PROVIDER_SITE_OTHER): Payer: 59 | Admitting: Pharmacist

## 2020-04-24 ENCOUNTER — Ambulatory Visit (INDEPENDENT_AMBULATORY_CARE_PROVIDER_SITE_OTHER): Payer: 59

## 2020-04-24 ENCOUNTER — Other Ambulatory Visit: Payer: Self-pay

## 2020-04-24 VITALS — BP 126/86 | HR 110

## 2020-04-24 DIAGNOSIS — E1165 Type 2 diabetes mellitus with hyperglycemia: Secondary | ICD-10-CM

## 2020-04-24 DIAGNOSIS — R0602 Shortness of breath: Secondary | ICD-10-CM | POA: Diagnosis not present

## 2020-04-24 DIAGNOSIS — Z794 Long term (current) use of insulin: Secondary | ICD-10-CM

## 2020-04-24 DIAGNOSIS — Z955 Presence of coronary angioplasty implant and graft: Secondary | ICD-10-CM

## 2020-04-24 DIAGNOSIS — Z23 Encounter for immunization: Secondary | ICD-10-CM | POA: Diagnosis not present

## 2020-04-24 MED ORDER — DEXCOM G6 SENSOR MISC: 1.0000 | 11 refills | Status: DC

## 2020-04-24 MED ORDER — EPINEPHRINE 0.3 MG/0.3ML IJ SOAJ: 0.3000 mg | Freq: Once | INTRAMUSCULAR | 1 refills | Status: AC | PRN

## 2020-04-24 MED ORDER — LANTUS SOLOSTAR 100 UNIT/ML ~~LOC~~ SOPN: PEN_INJECTOR | SUBCUTANEOUS | 11 refills | Status: DC

## 2020-04-24 MED ORDER — INSULIN LISPRO (1 UNIT DIAL) 100 UNIT/ML (KWIKPEN): 30.0000 [IU] | PEN_INJECTOR | Freq: Three times a day (TID) | SUBCUTANEOUS | 11 refills | Status: DC

## 2020-04-24 MED ORDER — NITROGLYCERIN 0.4 MG SL SUBL: 0.4000 mg | SUBLINGUAL_TABLET | SUBLINGUAL | 12 refills | Status: AC | PRN

## 2020-04-24 MED ORDER — DEXCOM G6 TRANSMITTER MISC: 1.0000 | 3 refills | Status: DC

## 2020-04-24 NOTE — Progress Notes (Signed)
Reviewed: I agree with Dr. Koval's documentation and management. 

## 2020-04-24 NOTE — Progress Notes (Signed)
   Covid-19 Vaccination Clinic  Name:  Morgan [name]    MRN: [id number] DOB: [birth year]  04/24/2020   Patient presents to nurse clinic for second [place] vaccination. Patient denies serious allergic reaction to first dose and answers no to all screening questions, with exception of serious allergic reactions. Administered in right deltoid, site unremarkable, tolerated injection well.   Morgan [name] was observed post Covid-19 immunization for 30 minutes based on pre-vaccination screening without incident.    She was provided with Vaccine Information Sheet and instruction to access the V-Safe system.   Morgan [name] was instructed to call 911 with any severe reactions post vaccine: [name] Difficulty breathing  . Swelling of face and throat  . A fast heartbeat  . A bad rash all over body  . Dizziness and weakness    Provided patient with updated immunization record and card.   Talbot Grumbling, RN

## 2020-04-24 NOTE — Patient Instructions (Addendum)
It was a pleasure seeing you in clinic today!  Today the plan is to: 1. Increase Lantus to 45 units AM and 45 units PM and increase by 2 units everyday if sugars > 200 units. Do not go over 60 units twice daily 2. Increase Humalog to 30 units with meals. Do NOT take Humalog if you do not eat a meal.   I will give you a call next Friday, October 1st to check and see how you are doing  Your next clinic visit will be on Thursday, October 7th @ 1:30PM  Please call the PharmD clinic at [phone] if you have any questions that you would like to speak with a pharmacist about (Dr. Nicholas Lose).   Please remember... 1. Sensor will last 10 days 2. Transmitter will last 90 days and must be reused 3. Sensor should be applied to area away from waistband, scarring, tattoos, irritation, and bones. 4. Transmitter must be within 20 feet of receiver/cell phone. 5. If using Dexcom G6 app on cell phone, please remember to keep app open (do not close out of app). 6. Do a fingerstick blood glucose test if the sensor readings do not match how    you feel 7. Remove sensor prior to magnetic resonance imaging (MRI), computed tomography (CT) scan, or high-frequency electrical heat (diathermy) treatment. 8. Do not allow sun screen or insect repellant to come into contact with Dexcom G6. These skin care products may lead for the plastic used in the Dexcom G6 to crack. 9. Dexcom G6 may be worn through a Environmental education officer. It may not be exposed to an advanced Imaging Technology (AIT) body scanner (also called a millimeter wave scanner) or the baggage x-ray machine. Instead, ask for hand-wanding or full-body pat-down and visual inspection.  10. Doses of acetaminophen (Tylenol) >1 gram every 6 hours may cause false high readings. 11. Hydroxyurea (Hydrea, Droxia) may interfere with accuracy of blood glucose readings from Dexcom G6. 12. Store sensor kit between 36 and 86 degrees Farenheit. Can be refrigerated  within this temperature range.  Ordering Overlay Patches 1. Go to the following website every 30 days to order new overlay patches:  [email or web address]  Problems with Dexcom sticking? 1. Order Skin Tac from [facility] Alcohol swab area you plan to administer Dexcom then let dry. Once dry, apply Skin Tac in a circular motion (with a spot in the middle for sensor without skin tac) and let dry. Once dry you can apply Dexcom!  Problems taking off Dexcom? 1. Remember to try to shower before removing Dexcom 2. Order Tac Away to help remove any extra adhesive left on your skin once you remove [facility]  Dexcom Customer Service Information 1. Customer Sales Support (dexcom orders and general customer questions) Phone number: [phone] Monday - Friday  6 AM - 5 PM PST Saturday 8 AM - 4 PM PST  *Contact if you do not receive overlay patches  2. Global Technical Support (product troubleshooting or replacement inquiries) Phone number: [phone] Available 24 hours a day; 7 days a week  *Contact if you have a "bad" sensor. Remember to tell them you are wearing Dexcom on your stomach!  3. Dexcom Care (provides dexcom CGM training, software downloads, and tutorials) Phone number: [phone] Monday - Friday 6 AM - 5 PM PST Saturday 7 AM - 1:30 PM PST (All hours subject to change)  4. Website: [email or web address]   See you in 10 days!

## 2020-04-24 NOTE — Progress Notes (Signed)
S:     PMH significant for HTN, CHF, T2DM, HLD, CAD s/p coronary stent, bipolar depression, hypothyroidism, migraines, esophagitis  Patient presents in good spirits for diabetes management and new start Dexcom G6 education. Patient was referred and last seen by Primary Care Provider on 9/2 in which Jardiance 25 mg daily was started.   Patient reports Diabetes was diagnosed more than 25 years  Today, patient reports taking Lantus 40 units in the mornings and 30 units in the evenings and Humalog 25 units w/meals 3-4x a day. Reports not starting Jardiance yet due to inability to pay copay cost - plans to start soon. Reports checking sugars 2-4 times a day with readings in the 300-500s. Reports last hypoglycemic episode was a month ago and she normally gets low when she takes Humalog and forgets to eat a meal; most common symptoms are restlessness and heat. Reports taking 3-4 glucose tablets with low sugars, however does not check glucose after 15 mins. Reports she has tried Ozempic in the past and experienced explosive nausea and vomiting that caused her to go into DKA and hospital admission. Also tried Victoza which she did not tolerate. Additionally, has used guardian CGM with an insulin pump years ago.  Family/Social History: -Tobacco: Former smoker (quit 2015)  Insurance coverage/medication affordability: [employer] commerical  Current diabetes medications include:   Lantus 30 units AM and 25 units PM (taking 40 units AM and 30 units PM)  Humalog 5 units TID w/meals (taking 25 units TID w/meals)  Jardiance 25 mg daily (not taking)  Previously tried diabetes medications include: Ozempic (severe N/V leading to DKA), Victoza (intolerance), metformin (intolerance)  Current hypertension medications include: losartan 25 mg daily, metoprolol tartrate 50 mg BID, furosemide 20 mg BID Current hyperlipidemia medications include: atorvastatin 80 mg daily  Patient-reported BG readings:  300-500s Patient denies hypoglycemic events.  Patient reported dietary habits: Eats 3-4 times daily, however forgets to eat some meals 1-2x a day   Patient reports nocturia (nighttime urination).  Patient reports neuropathy (nerve pain). Patient reports visual changes. Patient denies self foot exams.    Patient taking >1 gram acetaminophen every 6 hours: denies  Patient taking hydroxyrea: denes  Dexcom G6 patient education Person(s) instructed: patient  Instruction: Patient oriented to three components of Dexcom G6 continuous glucose monitor (sensor, transmitter, receiver/cellphone) Receiver or cellphone: cellphone . Dexcom G6 AND dexcom clarity app downloaded onto cellphone: iPhone  . Patient educated that Dexom G6 app must always be running (patient should not close out of app) . If using Dexcom G6 app, patient may share blood glucose data with up to 10 followers on dexcom follow app. Dexcom G6 account email: [email or web address] Dexcom G6 account password: [email or web address] Sample Sensor code: 1157 Sample Transmitter code: [id number]  CGM overview and set-up  1. Button, touch screen, and icons 2. Power supply and recharging 3. Home screen 4. Date and time 5. Set BG target range: 70-250 6. Set alarm/alert tone  7. Interstitial vs. capillary blood glucose readings  8. When to verify sensor reading with fingerstick blood glucose 9. Blood glucose reading measured every five minutes. 10. Sensor will last 10 days 11. Transmitter will last 90 days and must be reused  12. Transmitter must be within 20 feet of receiver/cell phone.  Sample Sensor application -- sensor placed on lower right abdomen 1. Site selection and site prep with alcohol pad 2. Sensor prep-sensor pack and sensor applicator 3. Sensor applied to area away from waistband, scarring, tattoos, irritation, and bones  4. Transmitter sanitized with alcohol pad and inserted into sensor. 5. Starting the sensor: 2 hour warm up  before BG readings available 6. Sensor change every 10 days and rotate site 7. Call Dexcom customer service if sensor comes off before 10 days  Safety and Troubleshooting 1. Do a fingerstick blood glucose test if the sensor readings do not match how you feel 2. Remove sensor prior to magnetic resonance imaging (MRI), computed tomography (CT) scan, or high-frequency electrical heat (diathermy) treatment. 3. Do not allow sun screen or insect repellant to come into contact with Dexcom G6. These skin care products may lead for the plastic used in the Dexcom G6 to crack. 4. Dexcom G6 may be worn through a Environmental education officer. It may not be exposed to an advanced Imaging Technology (AIT) body scanner (also called a millimeter wave scanner) or the baggage x-ray machine. Instead, ask for hand-wanding or full-body pat-down and visual inspection.  5. Doses of acetaminophen (Tylenol) >1 gram every 6 hours may cause false high readings. 6. Hydroxyurea (Hydrea, Droxia) may interfere with accuracy of blood glucose readings from Dexcom G6. 7. Store sensor kit between 36 and 86 degrees Farenheit. Can be refrigerated within this temperature range.  Contact information provided for [facility] customer service and/or trainer.  O:   Labs:   Vitals:   04/24/20 0936  Pulse: (!) 110  SpO2: 97%    Lab Results  Component Value Date   HGBA1C 14.7 (A) 04/03/2020   HGBA1C 14.5 (H) 11/24/2019   HGBA1C 14.2 (H) 07/30/2019    No results found for: CPEPTIDE     Component Value Date/Time   CHOL 124 04/03/2020 1351   TRIG 279 (H) 04/03/2020 1351   HDL 30 (L) 04/03/2020 1351   CHOLHDL 4.1 04/03/2020 1351   CHOLHDL 4.2 06/15/2019 0251   VLDL 49 (H) 06/15/2019 0251   LDLCALC 50 04/03/2020 1351   LDLDIRECT 144.0 09/25/2018 1505    Assessment: Diabetes longstanding currently uncontrolled with A1C 14.5% and home sugars are elevated in the 300-500s. Medication adherence appears optimal however patient has  not started Jardiance. Patient is a good candidate for continuous glucose monitoring as she checks sugars 2-4 times daily, uses 5-6 insulin injections daily, and occasionally experiences hypoglycemia. Discussed the clinical benefits of CGM devices and provided information on how to use the sensor and transmitter. Will provide sample of Dexcom G6M and send prescription to pharmacy. Sample Dexcom G6 CGM placed on patient's lower right abdomen successfully.  Plan: 1. Medications:  a. Increased Lantus to 45 units AM and 45 units PM and increase by 2 units daily if sugars > 200. Do NOT go over 60 units twice daily b. Increase Humalog to 30 units TID w/meals.  2. Extensively discussed pathophysiology of diabetes, recommended lifestyle interventions, dietary effects on blood sugar control 3. Counseled on s/sx of and management of hypoglycemia.  4. Next A1C anticipated December 2021.   History of allergic reactions - requested medication refill for epinephrine - provided  History of CAD s/p coronary stent and CHF - requested medication refill for nitroglycerin SL tablets - provided   Follow-up call next Friday, October 2nd.  Follow-up clinic appointment in 2 weeks to review sugar readings. Written patient instructions provided  This appointment required 60 minutes of patient care (this includes precharting, chart review, review of results, face-to-face care, etc.).  Thank you for involving pharmacy to assist in providing this patient's care.  Patient seen by Fara Olden, PharmD - PGY-1 Resident. and  Lorel Monaco, PharmD, PGY2 Pharmacy Resident.

## 2020-04-24 NOTE — Assessment & Plan Note (Signed)
Diabetes longstanding currently uncontrolled with A1C 14.5% and home sugars are elevated in the 300-500s. Medication adherence appears optimal however patient has not started Jardiance. Patient is a good candidate for continuous glucose monitoring as she checks sugars 2-4 times daily, uses 5-6 insulin injections daily, and occasionally experiences hypoglycemia. Discussed the clinical benefits of CGM devices and provided information on how to use the sensor and transmitter. Will provide sample of Dexcom G6M and send prescription to pharmacy. Sample Dexcom G6 CGM placed on patient's lower right abdomen successfully.  Plan: 1. Medications:  a. Increased Lantus to 45 units AM and 45 units PM and increase by 2 units daily if sugars > 200. Do NOT go over 60 units twice daily b. Increase Humalog to 30 units TID w/meals.  2. Extensively discussed pathophysiology of diabetes, recommended lifestyle interventions, dietary effects on blood sugar control 3. Counseled on s/sx of and management of hypoglycemia.

## 2020-04-25 ENCOUNTER — Telehealth: Payer: Self-pay | Admitting: Pharmacist

## 2020-04-25 NOTE — Telephone Encounter (Signed)
Patient's insurance requiring PA for Dexcom CGM prescription. Provided [facility] with [facility] fax number to send PA documents to be completed. If any additional information is needed, please let Dr. Valentina Lucks or I know.   Thanks, Lorel Monaco, PharmD PGY2 Ambulatory Care Resident [place]

## 2020-04-28 NOTE — Telephone Encounter (Signed)
PA received this morning for both sensors and transmitter. I have completed both PAs via covermymeds. Will await response.

## 2020-04-29 NOTE — Telephone Encounter (Signed)
Approved. Pharmacy has been updated.

## 2020-05-02 ENCOUNTER — Telehealth: Payer: Self-pay | Admitting: Pharmacist

## 2020-05-02 DIAGNOSIS — E1165 Type 2 diabetes mellitus with hyperglycemia: Secondary | ICD-10-CM

## 2020-05-02 NOTE — Telephone Encounter (Signed)
Contacted patient regarding blood glucose sugars and Dexcom CGM device, however patient did not answer phone and unable to leave voicemail. Will plan to see patient Thursday, October 7th as scheduled.  Lorel Monaco, PharmD PGY2 Ambulatory Care Resident [place]

## 2020-05-08 ENCOUNTER — Other Ambulatory Visit: Payer: Self-pay

## 2020-05-08 ENCOUNTER — Ambulatory Visit: Payer: 59 | Admitting: Licensed Clinical Social Worker

## 2020-05-08 ENCOUNTER — Encounter: Payer: Self-pay | Admitting: Pharmacist

## 2020-05-08 ENCOUNTER — Ambulatory Visit (INDEPENDENT_AMBULATORY_CARE_PROVIDER_SITE_OTHER): Payer: 59 | Admitting: Pharmacist

## 2020-05-08 VITALS — BP 134/76 | HR 82 | Wt 223.0 lb

## 2020-05-08 DIAGNOSIS — Z79899 Other long term (current) drug therapy: Secondary | ICD-10-CM

## 2020-05-08 DIAGNOSIS — Z794 Long term (current) use of insulin: Secondary | ICD-10-CM

## 2020-05-08 DIAGNOSIS — Z139 Encounter for screening, unspecified: Secondary | ICD-10-CM

## 2020-05-08 DIAGNOSIS — E1165 Type 2 diabetes mellitus with hyperglycemia: Secondary | ICD-10-CM

## 2020-05-08 NOTE — Chronic Care Management (AMB) (Signed)
Care Management   Clinical Social Work initial Note  05/08/2020 Name: Morgan [name] MRN: [id number] DOB: 01/26/1966 Morgan [name] is a 54 y.o. year old female who sees [name], DO for primary care. The Care Management team was consulted by Pharmacy team to assist the patient with Transportation Needs , [employer]  and [employer].[name]  LCSW met with [name] today in the office to assess needs and barriers to care.    Assessment: Patient is experiencing difficulty with food, needs resources due to job loss.        Recommendation: Patient may benefit from, and is in agreement to call contact resources provided. See care plan below for details Interventions:  - emotional support provided - empathic listening provided - questions answered - self-reliance encouraged - Provided : 2 food boxes, taxi voucher, [facility] calendar to monitor health, job resource support list, brochure for DSS Review of patient status, including review of consultants reports, relevant laboratory and other test results, and collaboration with appropriate care team members and the patient's provider was performed as part of comprehensive patient evaluation and provision of chronic care management services.    Plan: Patient would like continued follow-up. LCSW will F/U with patient in 2 weeks  Advance Directive Status:  Not addressed in this encounter SDOH (Social Determinants of Health) assessments performed: Yes: SDOH Interventions     Most Recent Value  SDOH Interventions  SDOH Interventions for the Following Domains Food Insecurity, Housing, Transportation  Food Insecurity Interventions Other (Comment)  [food boxes provided,  will apply for food stamps]  Housing Interventions Other (Comment)  [resources for rental assistance]  Transportation Interventions Taxi Voucher Given      ; Goals Addressed            This Visit's Progress   . Find Help in My Community       Follow Up Date  05/22/2020    - follow-up on any referrals for help I am given  - call DSS for food stamp and Cares Application for your rent - review employment resource list provided to assist with employment options - utilize food resource provided today for ongoing food pick up options - utilize the calendar you received today to track and monitor your health conditions   Why is this important?   Knowing how and where to find help for yourself or family in your neighborhood and community is an important skill.  You will want to connect to the resources I provided today .       Outpatient Encounter Medications as of 05/08/2020  Medication Sig Note  . Accu-Chek FastClix Lancets MISC TEST 3 TIMES DAILY   . ADVAIR HFA 115-21 MCG/ACT inhaler USE 2 INHALATIONS BY MOUTH  TWICE DAILY   . albuterol (VENTOLIN HFA) 108 (90 Base) MCG/ACT inhaler USE 2 TO 4 INHALATIONS BY  MOUTH EVERY 4 HOURS AS  NEEDED MANUFACTURER  RECOMMENDS NOT EXCEEDING 12 INH/DAY   . aspirin 81 MG tablet Take 1 tablet (81 mg total) by mouth daily.   [name] atorvastatin (LIPITOR) 80 MG tablet TAKE 1 TABLET BY MOUTH  DAILY   . Blood Glucose Monitoring Suppl (CONTOUR NEXT ONE) KIT 1 kit by Does not apply route as directed.   . Cholecalciferol (VITAMIN D3) 25 MCG (1000 UT) CAPS Take 1,000-2,000 Units by mouth See admin instructions. Take 2 tablets by mouth once a day on Mon/Wed/Fri and take 1 tablet on Sun/Tues/Thurs/Sat   . Continuous Blood Gluc  Sensor (DEXCOM G6 SENSOR) MISC Inject 1 applicator into the skin as directed. Change sensor every 10 days.   . Continuous Blood Gluc Transmit (DEXCOM G6 TRANSMITTER) MISC Inject 1 Device into the skin as directed. Reuse 8 times with sensor changes.   . diphenoxylate-atropine (LOMOTIL) 2.5-0.025 MG tablet Take 1 tablet by mouth 4 (four) times daily as needed for diarrhea or loose stools. (Patient not taking: Reported on 05/08/2020)   . empagliflozin (JARDIANCE) 25 MG TABS tablet Take 1 tablet (25 mg total) by  mouth daily. (Patient not taking: Reported on 05/08/2020) 05/08/2020: Haven't picked up due to cost  . EPINEPHrine 0.3 mg/0.3 mL IJ SOAJ injection Inject 0.3 mg into the muscle once as needed for anaphylaxis (for an anaphylactic reaction). (Patient not taking: Reported on 05/08/2020)   . furosemide (LASIX) 20 MG tablet Take 20 mg by mouth 2 (two) times daily.   . Glucagon, rDNA, (GLUCAGON EMERGENCY) 1 MG KIT Inject 1 mg into the vein once as needed (FOR ONSET OF HYPOGLYCEMIA). (Patient not taking: Reported on 04/24/2020)   . glucose blood (CONTOUR NEXT TEST) test strip USE AS DIRECTED   . insulin glargine (LANTUS SOLOSTAR) 100 UNIT/ML Solostar Pen Inject 45 Units into the skin every morning AND 45 Units at bedtime. 05/08/2020: Taking 49 units BID  . insulin lispro (HUMALOG KWIKPEN) 100 UNIT/ML KwikPen Inject 30 Units into the skin 3 (three) times daily.   . Insulin Pen Needle (PEN NEEDLES) 32G X 4 MM MISC 1 Device by Does not apply route 5 (five) times daily.   [name] lamoTRIgine (LAMICTAL) 200 MG tablet Take 1 tablet (200 mg total) by mouth daily. (Patient not taking: Reported on 05/08/2020) 05/08/2020: Haven't picked up due to cost  . levothyroxine (SYNTHROID, LEVOTHROID) 150 MCG tablet Take 150 mcg by mouth at bedtime. 3:30 Am (Patient not taking: Reported on 05/08/2020) 05/08/2020: Haven't picked up due to cost  . losartan (COZAAR) 100 MG tablet Take 100 mg by mouth daily. 05/08/2020: Taking QOD due to cost  . Lurasidone HCl (LATUDA) 60 MG TABS Take 1 tablet (60 mg total) by mouth at bedtime.   . metoCLOPramide (REGLAN) 5 MG tablet Take 1 tablet (5 mg total) by mouth every 6 (six) hours as needed for nausea or vomiting. (Patient not taking: Reported on 04/24/2020)   . metoprolol tartrate (LOPRESSOR) 50 MG tablet Take 1 tablet (50 mg total) by mouth 2 (two) times daily.   . Multiple Vitamin (MULTIVITAMIN WITH MINERALS) TABS tablet Take 1 tablet by mouth daily. Woman 50 +   . nitroGLYCERIN (NITROSTAT) 0.4 MG SL  tablet Place 1 tablet (0.4 mg total) under the tongue every 5 (five) minutes x 3 doses as needed for chest pain. (Patient not taking: Reported on 05/08/2020)   . nystatin (MYCOSTATIN/NYSTOP) powder Apply 1 application topically daily as needed (infection). (Patient not taking: Reported on 05/08/2020)   . nystatin ointment (MYCOSTATIN) Apply 1 application topically daily as needed (infection). (Patient not taking: Reported on 05/08/2020)   . pantoprazole (PROTONIX) 40 MG tablet Take 1 tablet (40 mg total) by mouth 2 (two) times daily before a meal. X 8 weeks (Patient taking differently: Take 40 mg by mouth 2 (two) times daily. ) 04/24/2020: Taking once a day  . potassium chloride SA (KLOR-CON) 20 MEQ tablet Take 20 mEq by mouth daily.   . SUMAtriptan (IMITREX) 25 MG tablet  TAKE 1 TABLET BY MOUTH EVERY 2 HOURS AS NEEDED FOR MIGRAINE. MAY REPEAT IN 2 HOURS IF HEADACHE PERSISTS  OR RECURS. PLEASE TAKE AS SOON AS YOU FEEL YOUR MIGRAINE STARTING.  (Patient not taking: Reported on 04/24/2020)   . topiramate (TOPAMAX) 25 MG tablet Take 1 tablet (25 mg total) by mouth at bedtime. (Patient not taking: Reported on 05/08/2020) 05/08/2020: Haven't picked up due to cost  . topiramate (TOPAMAX) 50 MG tablet Take 1 tablet (50 mg total) by mouth at bedtime. (Patient not taking: Reported on 05/08/2020) 05/08/2020: Haven't picked up due to cost  . traZODone (DESYREL) 50 MG tablet Take 50 mg by mouth at bedtime. (Patient not taking: Reported on 05/08/2020)   . venlafaxine XR (EFFEXOR-XR) 75 MG 24 hr capsule Take 75 mg by mouth daily. (Patient not taking: Reported on 05/08/2020) 05/08/2020: Haven't picked up due to cost   No facility-administered encounter medications on file as of 05/08/2020.       Information about Care Management services was shared with Ms.  Berton today including:  1. Care Management services include personalized support from designated clinical staff supervised by her physician, including individualized plan of  care and coordination with other care providers 2. Remind patient of 24/7 contact phone numbers to provider's office for assistance with urgent and routine care needs. 3. Care Management services are voluntary and patient may stop at any time .  Patient agreed to services provided today and verbal consent obtained.     [name], [place] / [place]   [phone] 3:18 PM

## 2020-05-08 NOTE — Patient Instructions (Signed)
°  Ms. Snare  it was nice speaking with you. Please call me directly if you have questions about the goals we discussed.  Goals Addressed            This Visit's Progress    Find Help in My Community       Follow Up Date 05/22/2020    - follow-up on any referrals for help I am given  - call DSS for food stamp and Cares Application for your rent - review employment resource list provided to assist with employment options - utilize food resource provided today for ongoing food pick up options - utilize the calendar you received today to track and monitor your health conditions   Why is this important?   Knowing how and where to find help for yourself or family in your neighborhood and community is an important skill.  You will want to connect to the resources I provided today .      Materials provided: Yes: see above Ms. Lanum received Care Management services today:  1. Care Management services include personalized support from designated clinical staff supervised by her physician, including individualized plan of care and coordination with other care providers 2. 24/7 contact [phone] for assistance for urgent and routine care needs. 3. Care Management are voluntary services and be declined at any time by calling the office.  Patient verbalizes understanding of instructions provided today.  Follow up plan: SW will follow up with patient by phone over the next 2 weeks  Maurine Cane, [place]

## 2020-05-08 NOTE — Progress Notes (Signed)
S:     PMH significant for HTN, CHF, T2DM, HLD, CAD s/p coronary stent, bipolar depression, hypothyroidism, migraines, esophagitis  Patient presents in good spirits today for diabetes management and Dexcom G6 application. Patient was referred and last seen by Primary Care Provider 04/03/20.  Patient reports Diabetes was diagnosed more than 25 years  Today, patient reports increasing Lantus to 49 units twice daily and continuing Humalog 30 units w/meals 3-4 times daily. Reports no issues with Dexcom device and was replace new sensor on her own, and has picked up a month supply of Dexcom materials. Patient reports difficulty affording food and some medications including Jardiance due to losing her job and health insurance a couple of weeks ago. Reports planning to apply for COBRA.   Insurance coverage/medication affordability: none at the moment   Family/Social History: -Tobacco: Former smoker (quit 2015)  Patient denies hypoglycemic events.  Patient reports adherence with diabetes medications.  Current diabetes medications include:  Lantus 49 units twice daily   Humalog 30 units TID w/meals   Jardiance 25 mg daily (not taking due to cost and loss of insurance)  Previously tried diabetes medications include: Ozempic (severe N/V leading to DKA), Victoza (intolerance), metformin (intolerance)  Current hypertension medications include:losartan 25 mg daily (taking QOD due to limited supply), metoprolol tartrate 50 mg BID, furosemide 20 mg BID (taking daily due to limited supply), Current hyperlipidemia medications include:atorvastatin 80 mg daily  Patient reported dietary habits: Eats 3-4 times daily, however forgets to eat some meals 1-2x a day. Now experiencing food insecurity due to loss of job.  Breakfast: sausage, egg and cheese biscuit   Dexcom G6 patient education  Instruction: Patient oriented to three components of Dexcom G6 continuous glucose monitor (sensor,  transmitter, receiver/cellphone) Receiver or cellphone: cellphone  Dexcom G6 AND dexcom clarity app downloaded onto cellphone:yes   Patient educated that Dexom G6 app must always be running (patient should not close out of app)  If using Dexcom G6 app, patient may share blood glucose data with up to 10 followers on dexcom follow app. Dexcom G6 account email: [email or web address] Dexcom G6 account password: N/A Sample Sensor code: 1610 Sample Transmitter code: [id number]  CGM overview and set-up  1. Button, touch screen, and icons 2. Power supply and recharging 3. Home screen 4. Date and time 5. Set BG target range: 70-250 6. Set alarm/alert tone  7. Interstitial vs. capillary blood glucose readings  8. When to verify sensor reading with fingerstick blood glucose 9. Blood glucose reading measured every five minutes. 10. Sensor will last 10 days 11. Transmitter will last 90 days and must be reused  12. Transmitter must be within 20 feet of receiver/cell phone.  Sensor application -- sensor placed on lower right abdomen 1. Site selection and site prep with alcohol pad 2. Sensor prep-sensor pack and sensor applicator 3. Sensor applied to area away from waistband, scarring, tattoos, irritation, and bones 4. Transmitter sanitized with alcohol pad and inserted into sensor. 5. Starting the sensor: 2 hour warm up before BG readings available 6. Sensor change every 10 days and rotate site 7. Call Dexcom customer service if sensor comes off before 10 days  Safety and Troubleshooting 1. Do a fingerstick blood glucose test if the sensor readings do not match how you feel 2. Remove sensor prior to magnetic resonance imaging (MRI), computed tomography (CT) scan, or high-frequency electrical heat (diathermy) treatment. 3. Do not allow sun screen or insect repellant to come into contact with Dexcom G6.  These skin care products may lead for the plastic used in the Dexcom G6 to crack. 4. Dexcom  G6 may be worn through a Environmental education officer. It may not be exposed to an advanced Imaging Technology (AIT) body scanner (also called a millimeter wave scanner) or the baggage x-ray machine. Instead, ask for hand-wanding or full-body pat-down and visual inspection.  5. Doses of acetaminophen (Tylenol) >1 gram every 6 hours may cause false high readings. 6. Hydroxyurea (Hydrea, Droxia) may interfere with accuracy of blood glucose readings from Dexcom G6. 7. Store sensor kit between 36 and 86 degrees Farenheit. Can be refrigerated within this temperature range.  Contact information provided for [facility] customer service and/or trainer.  O:   CGM report from last 14 days  Average Glucose 196m/dL  Glucose Meter Index (GMI)   Glucose Variability    Time in Range (TIR) %  Very High   High   Target 51%  Low 1%  Very Low     Labs:   Vitals:   05/08/20 1335  Pulse: 82  SpO2: 97%    Lab Results  Component Value Date   HGBA1C 14.7 (A) 04/03/2020   HGBA1C 14.5 (H) 11/24/2019   HGBA1C 14.2 (H) 07/30/2019    No results found for: CPEPTIDE     Component Value Date/Time   CHOL 124 04/03/2020 1351   TRIG 279 (H) 04/03/2020 1351   HDL 30 (L) 04/03/2020 1351   CHOLHDL 4.1 04/03/2020 1351   CHOLHDL 4.2 06/15/2019 0251   VLDL 49 (H) 06/15/2019 0251   LDLCALC 50 04/03/2020 1351   LDLDIRECT 144.0 09/25/2018 1505    No results found for: MICRALBCREAT     Assessment: DM is uncontrolled with recent A1c 14.5%, however blood sugars appear to be improving with Dexcom showing 51% in range, 1940mdL on average. Only 1% of readings are lows. She has good adherence with her medications, but unfortunately with her recent job loss she is running low on her supply of empagliflozin, lamotrigine, levothyroxine, lurasidone, furosemide and metoprolol.   Plan: 1. Medications:  a. Continue lantus 45 units AM and 45 units PM and increase by 2 units if sugars >200. Do NOT go over 60 units  twice daily. b. Continue Humalog 30 units TID w/ meals. 2. Continue to use Dexcom G6 CGM 3. Inquire about medication coverage with [employer]. Referred to case management for food insecurity & medication coverage during insurance gap 5. Extensively discussed pathophysiology of diabetes, recommended lifestyle interventions, dietary effects on blood sugar control 6. Counseled on s/sx of and management of hypoglycemia 7. Next A1C anticipated 07/2020  Follow-up appointment 10/21 via phone to review progress with insurance coverage and food availability. Written patient instructions provided.  This appointment required 60 minutes of patient care (this includes precharting, chart review, review of results, face-to-face care, etc.).  Thank you for involving pharmacy to assist in providing this patient's care.  Patient seen by CaMercy RidingPharmD - PGY1 Resident and IvLorel MonacoPharmD, PGY2 Resident

## 2020-05-08 NOTE — Patient Instructions (Signed)
It was a pleasure seeing you in clinic today!  Medication Changes: Continue Lantus to 49 units AM and 49 units PM and increase by 2 units everyday if sugars > 200 units. Do not go over 60 units twice daily  Continue Humalog to 30 units with meals. Do NOT take Humalog if you do not eat a meal.   I will give you a call in two weeks to see how you are doing!  Expect a call from [employer] Research scientist (life sciences)) about medications  Please remember... 1. Sensor will last 10 days 2. Transmitter will last 90 days and must be reused 3. Sensor should be applied to area away from waistband, scarring, tattoos, irritation, and bones. 4. Transmitter must be within 20 feet of receiver/cell phone. 5. If using Dexcom G6 app on cell phone, please remember to keep app open (do not close out of app). 6. Do a fingerstick blood glucose test if the sensor readings do not match how you feel 7. Remove sensor prior to magnetic resonance imaging (MRI), computed tomography (CT) scan, or high-frequency electrical heat (diathermy) treatment. 8. Do not allow sun screen or insect repellant to come into contact with Dexcom G6. These skin care products may lead for the plastic used in the Dexcom G6 to crack. 9. Dexcom G6 may be worn through a Environmental education officer. It may not be exposed to an advanced Imaging Technology (AIT) body scanner (also called a millimeter wave scanner) or the baggage x-ray machine. Instead, ask for hand-wanding or full-body pat-down and visual inspection.  10. Doses of acetaminophen (Tylenol) >1 gram every 6 hours may cause false high readings. 11. Hydroxyurea (Hydrea, Droxia) may interfere with accuracy of blood glucose readings from Dexcom G6. 12. Store sensor kit between 36 and 86 degrees Farenheit. Can be refrigerated within this temperature range.   Ordering Overlay Patches 1. Receiver: Go to the following website every 30 days to order new overlay patches:   [email or web address] 2. Cellphone (Dexcom G6 app): main screen --> settings  --> scroll down to contact --> request sensor overpatches   Problems with Dexcom sticking? 1. Order Skin Tac from [facility]. Alcohol swab area you plan to administer Dexcom then let dry. Once dry, apply Skin Tac in a circular motion (with a spot in the middle for sensor without skin tac) and let dry. Once dry you can apply Dexcom!   Problems taking off Dexcom? 1. Remember to try to shower/bathe before removing Dexcom 2. Order Tac Away to help remove any extra adhesive left on your skin once you remove [facility]   Dexcom Customer Service Information 1. Customer Sales Support (dexcom orders and general customer questions)       Phone number: [phone]       Monday - Friday  6 AM - 5 PM PST       Saturday 8 AM - 4 PM PST       *Contact them if you do not receive overlay patches*   2.   Global Technical Support (product troubleshooting or replacement inquiries)       Phone number: [phone]       Available 24 hours a day; 7 days a week       *Contact if you have a "bad" sensor. Remember to tell them you are wearing       Dexcom on your stomach!   3.   Dexcom Care (provides dexcom CGM training, software downloads, and tutorials)       Phone number: [phone]  Monday - Friday 6 AM - 5 PM PST       Saturday 7 AM - 1:30 PM PST      (All hours subject to change)   4.   Website: [email or web address]

## 2020-05-08 NOTE — Assessment & Plan Note (Signed)
DM is uncontrolled with recent A1c 14.5%, however blood sugars appear to be improving with Dexcom showing 51% in range, 191mg /dL on average. Only 1% of readings are lows. She has good adherence with her medications, but unfortunately with her recent job loss she is running low on her supply of empagliflozin, lamotrigine, levothyroxine, lurasidone, furosemide and metoprolol.  - Continue lantus 45 units AM and 45 units PM and increase by 2 units if sugars >200. Do NOT go over 60 units twice daily. - Continue Humalog 30 units TID w/ meals. - Continue to use Dexcom G6 CGM

## 2020-05-09 NOTE — Progress Notes (Signed)
Reviewed: I agree with Dr. Koval's documentation and management. 

## 2020-05-12 ENCOUNTER — Ambulatory Visit (INDEPENDENT_AMBULATORY_CARE_PROVIDER_SITE_OTHER): Payer: 59 | Admitting: Family Medicine

## 2020-05-12 ENCOUNTER — Encounter: Payer: Self-pay | Admitting: Family Medicine

## 2020-05-12 DIAGNOSIS — E02 Subclinical iodine-deficiency hypothyroidism: Secondary | ICD-10-CM

## 2020-05-12 DIAGNOSIS — I10 Essential (primary) hypertension: Secondary | ICD-10-CM

## 2020-05-12 DIAGNOSIS — Z794 Long term (current) use of insulin: Secondary | ICD-10-CM

## 2020-05-12 DIAGNOSIS — E1165 Type 2 diabetes mellitus with hyperglycemia: Secondary | ICD-10-CM

## 2020-05-12 DIAGNOSIS — E785 Hyperlipidemia, unspecified: Secondary | ICD-10-CM

## 2020-05-12 DIAGNOSIS — Z91199 Patient's noncompliance with other medical treatment and regimen due to unspecified reason: Secondary | ICD-10-CM | POA: Insufficient documentation

## 2020-05-12 DIAGNOSIS — Z5329 Procedure and treatment not carried out because of patient's decision for other reasons: Secondary | ICD-10-CM

## 2020-05-12 NOTE — Progress Notes (Signed)
Patient was a no-show to her 05/12/2020 appointment to follow-up for her diabetes, HTN, HLD, hypothyroidism, and for health maintenance.  The patient has now missed 5 appointments in the last 6 months.  A letter was sent to her on 03/14/2020 regarding multiple no-shows, she was informed at this time that if she has another no-show then she will be dismissed from the clinic.  Sending this information to Dr. Gwendlyn Deutscher for review.  Milus Banister, [place], PGY-3 05/12/2020 6:09 PM    Agenda for 05/12/20 appt: Type 2 diabetes: Patient recently seen by Dr. Valentina Lucks on 05/08/2020 for diabetes and medication management and Dexcom G6 application.  Last HbA1c September 2021 14.7%. current diabetes medications include: Lantus 49 units twice daily, Humalog 30 units TID w/meals.  Patient no longer taking Jardiance due to cost of medication and recent loss of insurance.  Has previously tried Ozempic, Victoza, and Metformin.   Hypertension:losartan 25 mg every other day, metoprolol tartrate 50 mg BID,furosemide20 mg daily due to limited supply  HLD: Currently taking Lipitor 80 mg.  Most recent LDL 50, at goal (less than 70). Hypothyroidism: Last TSH checked 2.978 in December 2019.  Patient currently prescribed levothyroxine 50 mcg daily. Health maintenance: Patient due for Pap smear, eye exam, and colonoscopy.  Currently scheduled for mammogram 06/13/2020. PERTINENT  PMH / PSH: HTN, CHF, T2DM, HLD, CAD s/p coronary stent, bipolar depression, hypothyroidism, migraines, esophagitis

## 2020-05-19 ENCOUNTER — Other Ambulatory Visit: Payer: Self-pay | Admitting: Family Medicine

## 2020-05-19 ENCOUNTER — Telehealth: Payer: Self-pay | Admitting: Family Medicine

## 2020-05-19 MED ORDER — ATORVASTATIN CALCIUM 80 MG PO TABS: 80.0000 mg | ORAL_TABLET | Freq: Every day | ORAL | 0 refills | Status: DC

## 2020-05-19 MED ORDER — LEVOTHYROXINE SODIUM 150 MCG PO TABS: 150.0000 ug | ORAL_TABLET | Freq: Every day | ORAL | 0 refills | Status: DC

## 2020-05-19 MED ORDER — METOPROLOL TARTRATE 50 MG PO TABS: 50.0000 mg | ORAL_TABLET | Freq: Two times a day (BID) | ORAL | 0 refills | Status: DC

## 2020-05-19 MED ORDER — LOSARTAN POTASSIUM 100 MG PO TABS: 100.0000 mg | ORAL_TABLET | Freq: Every day | ORAL | 0 refills | Status: DC

## 2020-05-19 MED ORDER — LAMOTRIGINE 200 MG PO TABS
200.0000 mg | ORAL_TABLET | Freq: Every day | ORAL | 6 refills | Status: DC
Start: 2020-05-19 — End: 2020-10-16

## 2020-05-19 MED ORDER — LATUDA 60 MG PO TABS
60.0000 mg | ORAL_TABLET | Freq: Every day | ORAL | 6 refills | Status: DC
Start: 2020-05-19 — End: 2020-08-25

## 2020-05-19 NOTE — Telephone Encounter (Signed)
PCP discussed her multiple no-shows and same-day cancellation to her appointments with me. Upon record review, she has had more than three no-shows/same-day cancellations in 6 months period.  I reviewed PCP's documentation regarding mailing her a no-show letter.   The patient informed me that she did not receive the letter and was unaware of our no-show policy. I went over our no-show and same-day cancellation policy in detail with her. Dr. Ouida Sills and I agree that she will be dismissed from the practice if she is no-showed one more time.   The patient agreed to call more than 24 hours in advance if she could not make her appointment, and she verbalized understanding that she would be dismissed with subsequent no-shows.  She also asked that Dr. Valentina Lucks gives her a call regarding her DM management. I will forward the message to Dr. Valentina Lucks.

## 2020-05-19 NOTE — Telephone Encounter (Signed)
Patient Requested call via Dr. Gwendlyn Deutscher.   She had three questions.   First was a clarification of insulin dosing.  She thought she had heard in the that mealtime doses of humalog should not be higher than 25 units.  I clarified that 30 units prior to meal for Humalog is appropriate and explained mealtime doses can be higher than 25 units.   Second was a request for help with medications through some mediation assistance process.  After I explained how I could help, she explained that she has a job starting in 10 days and she will have coverage on day #1.   We agreed that long-term medication supply via med manufactures was not the path forward.   Third, she requested new prescriptions be sent to [facility] for all medications that she is out of to allow for short-term use of $4 med list.   I agreed to help with 1 time refills for multiple chronic medications and I did not want her to go without these medications.    New Rxs provided - 1 month supply of Levothyroxine, losartan, metoprolol, and atorvastatin.    I deferred to PCP Lamotrigine, lurasidone topiramate Patient has 1 week or less of all of these.   Once processed, I returned call to patient to make her aware that her new prescriptions had been sent.  She was appreciative.

## 2020-05-19 NOTE — Addendum Note (Signed)
Addended by: Leavy Cella on: 05/19/2020 02:20 PM   Modules accepted: Orders

## 2020-05-19 NOTE — Telephone Encounter (Signed)
-----   Message from Kinnie Feil, MD sent at 05/14/2020  5:10 AM EDT ----- Regarding: No show [name], DO  Kinnie Feil, MD Hello Dr. Gwendlyn Deutscher, this is a patient who has now no-show to our clinic 5 times in the last 6 months. I wrote her a letter and send to her house on 03/14/2020 which is documented in the patient's chart regarding her no-shows. At that time she had already had 4 no-shows within the last 6 months, I instructed her that if she misses 1 more she will be dismissed from the clinic.  While this patient does have difficulties with transportation, I feel that it is well within her realm to contact our clinic to let us know whether or not she can make an appointment. I have talked to her about this before. She has unfortunately taken up many appointments lots for patients to come into our clinic and seek help from their doctor, as well as stripped me of opportunities to learn from our patients.     Milus Banister, [place], PGY-3  05/12/2020 6:13 PM

## 2020-05-22 ENCOUNTER — Telehealth: Payer: Self-pay | Admitting: Licensed Clinical Social Worker

## 2020-05-22 NOTE — Chronic Care Management (AMB) (Signed)
    Clinical Social Work  Unsuccessful Care Management Outreach   05/22/2020 Name: Morgan [name] MRN: [id number] DOB: [birth year]  [name] is a 54 y.o. year old female who is a primary care patient of [name], DO .   F/U phone call to [name] today to assess needs, and progress with resources.  The outreach was unsuccessful. Unable to leave a HIPPA compliant phone message due to no voice mail.  Plan: LCSW will call again in 5 to 7 days.  Review of patient status, including review of consultants reports, relevant laboratory and other test results, and collaboration with appropriate care team members and the patient's provider was performed as part of comprehensive patient evaluation and provision of care management services.    [name], [place] / [place]   [phone] 2:26 PM

## 2020-05-29 ENCOUNTER — Telehealth: Payer: Self-pay | Admitting: Pharmacist

## 2020-05-29 DIAGNOSIS — Z794 Long term (current) use of insulin: Secondary | ICD-10-CM

## 2020-05-29 DIAGNOSIS — E1165 Type 2 diabetes mellitus with hyperglycemia: Secondary | ICD-10-CM

## 2020-05-29 NOTE — Telephone Encounter (Signed)
Contacted patient regarding medications. Patient reports she has started working and insurance is active as of October 26 and plans to gather prescription insurance information soon. Patient is currently at work and requested call back in 1 week before 10AM.  Will call patient next Thursday before 10AM to confirm she has medication access and can afford her medications.

## 2020-06-04 ENCOUNTER — Telehealth: Payer: Self-pay | Admitting: Licensed Clinical Social Worker

## 2020-06-04 NOTE — Chronic Care Management (AMB) (Signed)
   Social Work Care Management  2nd Unsuccessful  Phone Outreach   06/04/2020 Name: Morgan [name] MRN: [id number] DOB: 23-Nov-1965  Reason for referral : Care Coordination (f/u) for community support   Morgan [name] is a 54 y.o. year old female who sees [name], DO for primary care.2nd unsuccessful telephone outreach attempt to [name] today. A HIPPA compliant phone message was left for the patient providing contact information and requesting a return call.  Plan: LCSW will wait for return call, if no return call is received, will reach out to [name] again over the next 7 days. If unable to reach [name] by phone on the 3rd attempt, will discontinue outreach calls but will be available at any time to provide services to [name].   Review of patient status, including review of consultants reports, relevant laboratory and other test results, and collaboration with appropriate care team members and the patient's provider was performed as part of comprehensive patient evaluation and provision of care management services.   [name], [place] / [place]   [phone] 9:54 AM

## 2020-06-10 ENCOUNTER — Ambulatory Visit: Payer: Medicaid Other | Admitting: Licensed Clinical Social Worker

## 2020-06-10 DIAGNOSIS — Z789 Other specified health status: Secondary | ICD-10-CM

## 2020-06-10 NOTE — Patient Instructions (Signed)
°  Morgan [name]  it was nice speaking with you. Please call me directly [phone] if you have questions about the goals we discussed. Goals Addressed            This Visit's Progress    COMPLETED: Find Help in My Community       - follow-up on any referrals for help I am given  - call DSS for food stamp and Cares Application for your rent - review employment resource list provided to assist with employment options - utilize food resource provided today for ongoing food pick up options - utilize the calendar you received today to track and monitor your health conditions       Morgan [name] received Care Management services today:  1. Care Management services include personalized support from designated clinical staff supervised by her physician, including individualized plan of care and coordination with other care providers 2. 24/7 contact [phone] for assistance for urgent and routine care needs. 3. Care Management are voluntary services and be declined at any time by calling the office.  Patient verbalizes understanding of instructions provided today.  Follow up plan: Client will call office if needed  Maurine Cane, LCSW

## 2020-06-10 NOTE — Chronic Care Management (AMB) (Signed)
Care Management   Clinical Social Work Follow Up   06/10/2020 Name: Franki C Velador MRN: [id number] DOB: 02/20/1966 Referred by: Anderson, Hannah C, DO  Reason for referral : Care Coordination (f/u for community support)  Morgan [name] is a 54 y.o. year old female who is a primary care patient of Anderson, Hannah C, DO.  Reason for follow-up: assess for barriers and progress with community support .    Assessment: Patient is in a class and not able to talk long. States things are going well.    Plan: Patient will call office if needed.  LCSW will discontinue out reach call.  Will disconnect from chart if no needs are identifies in 60 days.   Advance Directive Status:; not addressed during this encounter.  SDOH (Social Determinants of Health) assessments performed:  No new needs identified or goals established    Outpatient Encounter Medications as of 06/10/2020  Medication Sig Note  . Accu-Chek FastClix Lancets MISC TEST 3 TIMES DAILY   . ADVAIR HFA 115-21 MCG/ACT inhaler USE 2 INHALATIONS BY MOUTH  TWICE DAILY   . albuterol (VENTOLIN HFA) 108 (90 Base) MCG/ACT inhaler USE 2 TO 4 INHALATIONS BY  MOUTH EVERY 4 HOURS AS  NEEDED MANUFACTURER  RECOMMENDS NOT EXCEEDING 12 INH/DAY   . aspirin 81 MG tablet Take 1 tablet (81 mg total) by mouth daily.   . atorvastatin (LIPITOR) 80 MG tablet Take 1 tablet (80 mg total) by mouth daily.   . Blood Glucose Monitoring Suppl (CONTOUR NEXT ONE) KIT 1 kit by Does not apply route as directed.   . Cholecalciferol (VITAMIN D3) 25 MCG (1000 UT) CAPS Take 1,000-2,000 Units by mouth See admin instructions. Take 2 tablets by mouth once a day on Mon/Wed/Fri and take 1 tablet on Sun/Tues/Thurs/Sat   . Continuous Blood Gluc Sensor (DEXCOM G6 SENSOR) MISC Inject 1 applicator into the skin as directed. Change sensor every 10 days.   . Continuous Blood Gluc Transmit (DEXCOM G6 TRANSMITTER) MISC Inject 1 Device into the skin as directed. Reuse 8 times with sensor  changes.   . diphenoxylate-atropine (LOMOTIL) 2.5-0.025 MG tablet Take 1 tablet by mouth 4 (four) times daily as needed for diarrhea or loose stools. (Patient not taking: Reported on 05/08/2020)   . empagliflozin (JARDIANCE) 25 MG TABS tablet Take 1 tablet (25 mg total) by mouth daily. (Patient not taking: Reported on 05/08/2020) 05/08/2020: Haven't picked up due to cost  . EPINEPHrine 0.3 mg/0.3 mL IJ SOAJ injection Inject 0.3 mg into the muscle once as needed for anaphylaxis (for an anaphylactic reaction). (Patient not taking: Reported on 05/08/2020)   . furosemide (LASIX) 20 MG tablet Take 20 mg by mouth 2 (two) times daily.   . Glucagon, rDNA, (GLUCAGON EMERGENCY) 1 MG KIT Inject 1 mg into the vein once as needed (FOR ONSET OF HYPOGLYCEMIA). (Patient not taking: Reported on 04/24/2020)   . glucose blood (CONTOUR NEXT TEST) test strip USE AS DIRECTED   . insulin glargine (LANTUS SOLOSTAR) 100 UNIT/ML Solostar Pen Inject 45 Units into the skin every morning AND 45 Units at bedtime. 05/08/2020: Taking 49 units BID  . insulin lispro (HUMALOG KWIKPEN) 100 UNIT/ML KwikPen Inject 30 Units into the skin 3 (three) times daily.   . Insulin Pen Needle (PEN NEEDLES) 32G X 4 MM MISC 1 Device by Does not apply route 5 (five) times daily.   . lamoTRIgine (LAMICTAL) 200 MG tablet Take 1 tablet (200 mg total) by mouth daily.   . levothyroxine (  SYNTHROID) 150 MCG tablet Take 1 tablet (150 mcg total) by mouth at bedtime. 3:30 Am   . losartan (COZAAR) 100 MG tablet Take 1 tablet (100 mg total) by mouth daily.   . Lurasidone HCl (LATUDA) 60 MG TABS Take 1 tablet (60 mg total) by mouth at bedtime.   . metoCLOPramide (REGLAN) 5 MG tablet Take 1 tablet (5 mg total) by mouth every 6 (six) hours as needed for nausea or vomiting. (Patient not taking: Reported on 04/24/2020)   . metoprolol tartrate (LOPRESSOR) 50 MG tablet Take 1 tablet (50 mg total) by mouth 2 (two) times daily.   . Multiple Vitamin (MULTIVITAMIN WITH MINERALS)  TABS tablet Take 1 tablet by mouth daily. Woman 50 +   . nitroGLYCERIN (NITROSTAT) 0.4 MG SL tablet Place 1 tablet (0.4 mg total) under the tongue every 5 (five) minutes x 3 doses as needed for chest pain. (Patient not taking: Reported on 05/08/2020)   . nystatin (MYCOSTATIN/NYSTOP) powder Apply 1 application topically daily as needed (infection). (Patient not taking: Reported on 05/08/2020)   . nystatin ointment (MYCOSTATIN) Apply 1 application topically daily as needed (infection). (Patient not taking: Reported on 05/08/2020)   . pantoprazole (PROTONIX) 40 MG tablet Take 1 tablet (40 mg total) by mouth 2 (two) times daily before a meal. X 8 weeks (Patient taking differently: Take 40 mg by mouth 2 (two) times daily. ) 04/24/2020: Taking once a day  . potassium chloride SA (KLOR-CON) 20 MEQ tablet Take 20 mEq by mouth daily.   . SUMAtriptan (IMITREX) 25 MG tablet  TAKE 1 TABLET BY MOUTH EVERY 2 HOURS AS NEEDED FOR MIGRAINE. MAY REPEAT IN 2 HOURS IF HEADACHE PERSISTS OR RECURS. PLEASE TAKE AS SOON AS YOU FEEL YOUR MIGRAINE STARTING.  (Patient not taking: Reported on 04/24/2020)   . topiramate (TOPAMAX) 25 MG tablet Take 1 tablet (25 mg total) by mouth at bedtime. (Patient not taking: Reported on 05/08/2020) 05/08/2020: Haven't picked up due to cost  . topiramate (TOPAMAX) 50 MG tablet Take 1 tablet (50 mg total) by mouth at bedtime. (Patient not taking: Reported on 05/08/2020) 05/08/2020: Haven't picked up due to cost  . traZODone (DESYREL) 50 MG tablet Take 50 mg by mouth at bedtime. (Patient not taking: Reported on 05/08/2020)   . venlafaxine XR (EFFEXOR-XR) 75 MG 24 hr capsule Take 75 mg by mouth daily. (Patient not taking: Reported on 05/08/2020) 05/08/2020: Haven't picked up due to cost   No facility-administered encounter medications on file as of 06/10/2020.   Review of patient status, including review of consultants reports, relevant laboratory and other test results, and collaboration with appropriate care  team members and the patient's provider was performed as part of comprehensive patient evaluation and provision of care management services.    , LCSW Care Management & Coordination  [facility] / [facility]   [phone] 2:28 PM 

## 2020-06-12 ENCOUNTER — Telehealth: Payer: Self-pay | Admitting: Pharmacist

## 2020-06-12 NOTE — Telephone Encounter (Signed)
Contacted patient regarding medication access. Patient reports she is still waiting on her insurance card to come through the mail. Reports she has enough insulin and is doing fine. Encouraged patient to call if she has any concerns or questions.

## 2020-06-13 ENCOUNTER — Ambulatory Visit: Payer: 59

## 2020-06-20 ENCOUNTER — Telehealth: Payer: Self-pay | Admitting: Pharmacist

## 2020-06-20 DIAGNOSIS — I5021 Acute systolic (congestive) heart failure: Secondary | ICD-10-CM

## 2020-06-20 DIAGNOSIS — Z794 Long term (current) use of insulin: Secondary | ICD-10-CM

## 2020-06-20 DIAGNOSIS — G43709 Chronic migraine without aura, not intractable, without status migrainosus: Secondary | ICD-10-CM

## 2020-06-20 DIAGNOSIS — E1165 Type 2 diabetes mellitus with hyperglycemia: Secondary | ICD-10-CM

## 2020-06-20 MED ORDER — TOPIRAMATE 25 MG PO TABS: 25.0000 mg | ORAL_TABLET | Freq: Every day | ORAL | 3 refills | Status: DC

## 2020-06-20 MED ORDER — LANTUS SOLOSTAR 100 UNIT/ML ~~LOC~~ SOPN: PEN_INJECTOR | SUBCUTANEOUS | 11 refills | Status: DC

## 2020-06-20 MED ORDER — INSULIN LISPRO (1 UNIT DIAL) 100 UNIT/ML (KWIKPEN): 40.0000 [IU] | PEN_INJECTOR | Freq: Four times a day (QID) | SUBCUTANEOUS | 5 refills | Status: DC

## 2020-06-20 MED ORDER — DEXCOM G6 SENSOR MISC: 1.0000 | 11 refills | Status: DC

## 2020-06-20 MED ORDER — FUROSEMIDE 20 MG PO TABS: 20.0000 mg | ORAL_TABLET | Freq: Two times a day (BID) | ORAL | 5 refills | Status: DC

## 2020-06-20 MED ORDER — TOPIRAMATE 50 MG PO TABS: 50.0000 mg | ORAL_TABLET | Freq: Every day | ORAL | 3 refills | Status: DC

## 2020-06-20 NOTE — Telephone Encounter (Signed)
Call from patient who reports NEW insurance coverage and asks for prescriptions to be moved to a different pharmacy [facility]).   I agreed to provide refills for insulins (Lantus and Humalog), topiramate, furosemide and potassium.   She also needs Dexcom CGM sensor which will require a prior auth potentially.  I agreed to submit to the pharmacy and begin the process.   Patient taking insulin injections 6 times daily and testing 3-4 times daily.    Patient plans a visit to our office in the near future.

## 2020-06-23 NOTE — Telephone Encounter (Signed)
Noted and agree. 

## 2020-07-01 ENCOUNTER — Telehealth: Payer: Self-pay

## 2020-07-01 NOTE — Telephone Encounter (Signed)
Will check with RN team as they are the ones that process the paperwork for dexcom.  Dylan Ruotolo,CMA

## 2020-07-01 NOTE — Telephone Encounter (Signed)
Pt called in requesting Dexcom authorization with her new ins HP2 ([place]) [phone] ID# [id number]

## 2020-07-03 ENCOUNTER — Other Ambulatory Visit: Payer: Self-pay

## 2020-07-04 MED ORDER — POTASSIUM CHLORIDE CRYS ER 20 MEQ PO TBCR
20.0000 meq | EXTENDED_RELEASE_TABLET | Freq: Every day | ORAL | 3 refills | Status: DC
Start: 2020-07-04 — End: 2020-07-09

## 2020-07-04 NOTE — Telephone Encounter (Signed)
Patients new insurance plan, Morgan [name], will not allow for covermymeds authorizations. I contacted their PA line and started the PA process. Unfortunately, the insurance has to fax [place] a form. Once I receive form I should be able to complete process.

## 2020-07-07 ENCOUNTER — Observation Stay (HOSPITAL_COMMUNITY)
Admission: EM | Admit: 2020-07-07 | Discharge: 2020-07-09 | Disposition: A | Discharge status: Home / Self Care | Payer: PRIVATE HEALTH INSURANCE | Attending: Family Medicine | Admitting: Family Medicine

## 2020-07-07 ENCOUNTER — Ambulatory Visit (INDEPENDENT_AMBULATORY_CARE_PROVIDER_SITE_OTHER): Payer: Self-pay | Admitting: Family Medicine

## 2020-07-07 ENCOUNTER — Other Ambulatory Visit: Payer: Self-pay

## 2020-07-07 ENCOUNTER — Encounter (HOSPITAL_COMMUNITY): Payer: Self-pay | Admitting: Emergency Medicine

## 2020-07-07 ENCOUNTER — Telehealth: Payer: Self-pay

## 2020-07-07 VITALS — BP 122/78 | HR 83 | Temp 98.7°F

## 2020-07-07 DIAGNOSIS — E1165 Type 2 diabetes mellitus with hyperglycemia: Secondary | ICD-10-CM

## 2020-07-07 DIAGNOSIS — Z87891 Personal history of nicotine dependence: Secondary | ICD-10-CM | POA: Diagnosis not present

## 2020-07-07 DIAGNOSIS — Z20822 Contact with and (suspected) exposure to covid-19: Secondary | ICD-10-CM | POA: Diagnosis not present

## 2020-07-07 DIAGNOSIS — J45909 Unspecified asthma, uncomplicated: Secondary | ICD-10-CM | POA: Insufficient documentation

## 2020-07-07 DIAGNOSIS — I10 Essential (primary) hypertension: Secondary | ICD-10-CM | POA: Diagnosis present

## 2020-07-07 DIAGNOSIS — Z79899 Other long term (current) drug therapy: Secondary | ICD-10-CM | POA: Diagnosis not present

## 2020-07-07 DIAGNOSIS — F319 Bipolar disorder, unspecified: Secondary | ICD-10-CM | POA: Diagnosis present

## 2020-07-07 DIAGNOSIS — Z7982 Long term (current) use of aspirin: Secondary | ICD-10-CM | POA: Insufficient documentation

## 2020-07-07 DIAGNOSIS — E785 Hyperlipidemia, unspecified: Secondary | ICD-10-CM | POA: Diagnosis present

## 2020-07-07 DIAGNOSIS — Z23 Encounter for immunization: Secondary | ICD-10-CM | POA: Diagnosis not present

## 2020-07-07 DIAGNOSIS — R110 Nausea: Secondary | ICD-10-CM

## 2020-07-07 DIAGNOSIS — R739 Hyperglycemia, unspecified: Secondary | ICD-10-CM | POA: Diagnosis present

## 2020-07-07 DIAGNOSIS — Z7984 Long term (current) use of oral hypoglycemic drugs: Secondary | ICD-10-CM | POA: Insufficient documentation

## 2020-07-07 DIAGNOSIS — Z794 Long term (current) use of insulin: Secondary | ICD-10-CM | POA: Insufficient documentation

## 2020-07-07 DIAGNOSIS — Z7952 Long term (current) use of systemic steroids: Secondary | ICD-10-CM | POA: Diagnosis not present

## 2020-07-07 DIAGNOSIS — R059 Cough, unspecified: Secondary | ICD-10-CM

## 2020-07-07 DIAGNOSIS — E1110 Type 2 diabetes mellitus with ketoacidosis without coma: Secondary | ICD-10-CM | POA: Diagnosis not present

## 2020-07-07 DIAGNOSIS — E039 Hypothyroidism, unspecified: Secondary | ICD-10-CM | POA: Diagnosis not present

## 2020-07-07 DIAGNOSIS — Z955 Presence of coronary angioplasty implant and graft: Secondary | ICD-10-CM

## 2020-07-07 LAB — CBC
HCT: 44.2 % (ref 36.0–46.0)
Hemoglobin: 13.6 g/dL (ref 12.0–15.0)
MCH: 20.1 pg — ABNORMAL LOW (ref 26.0–34.0)
MCHC: 30.8 g/dL (ref 30.0–36.0)
MCV: 65.5 fL — ABNORMAL LOW (ref 80.0–100.0)
Platelets: 341 10*3/uL (ref 150–400)
RBC: 6.75 MIL/uL — ABNORMAL HIGH (ref 3.87–5.11)
RDW: 19.4 % — ABNORMAL HIGH (ref 11.5–15.5)
WBC: 12.2 10*3/uL — ABNORMAL HIGH (ref 4.0–10.5)
nRBC: 0 % (ref 0.0–0.2)

## 2020-07-07 LAB — POCT GLYCOSYLATED HEMOGLOBIN (HGB A1C): Hemoglobin A1C: 11.9 % — AB (ref 4.0–5.6)

## 2020-07-07 LAB — BASIC METABOLIC PANEL
Anion gap: 12 (ref 5–15)
BUN: 13 mg/dL (ref 6–20)
CO2: 23 mmol/L (ref 22–32)
Calcium: 9.2 mg/dL (ref 8.9–10.3)
Chloride: 99 mmol/L (ref 98–111)
Creatinine, Ser: 0.75 mg/dL (ref 0.44–1.00)
GFR, Estimated: >60 mL/min (ref >60)
Glucose, Bld: 518 mg/dL (ref 70–99)
Potassium: 3.9 mmol/L (ref 3.5–5.1)
Sodium: 134 mmol/L — ABNORMAL LOW (ref 135–145)

## 2020-07-07 LAB — I-STAT BETA HCG BLOOD, ED (MC, WL, AP ONLY): I-stat hCG, quantitative: 6.9 m[IU]/mL — ABNORMAL HIGH (ref <5)

## 2020-07-07 LAB — GLUCOSE, POCT (MANUAL RESULT ENTRY): POC Glucose: 493 mg/dl — AB (ref 70–99)

## 2020-07-07 MED ORDER — CONTOUR NEXT TEST VI STRP
ORAL_STRIP | 3 refills | Status: DC
Start: 2020-07-07 — End: 2020-07-09

## 2020-07-07 MED ORDER — CONTOUR NEXT TEST VI STRP
ORAL_STRIP | 3 refills | Status: DC
Start: 2020-07-07 — End: 2020-07-07

## 2020-07-07 NOTE — ED Triage Notes (Signed)
Pt states she was referred here by PCP for high blood sugar, states reading at home >400.

## 2020-07-07 NOTE — Telephone Encounter (Signed)
Received phone call from pharmacist regarding glucose test strips. Insurance requires specific instructions regarding frequency of testing and diagnosis code.   Resent order with required documentation.   FYI no further action is required.   Talbot Grumbling, RN

## 2020-07-07 NOTE — Telephone Encounter (Signed)
PA form received. I have completed and Hensel signed. Form has been faxed to insurance and original is at my desk in RN room.

## 2020-07-07 NOTE — Assessment & Plan Note (Signed)
Despite her desire to avoid the emergency room, she was informed that she is too far progressed to handle this in the outpatient setting.  Given her history of impressive insulin resistance using a total of 270 units of insulin daily and difficulty staying compliant with regular blood glucose checks and documentation, I do not feel comfortable managing this in the outpatient setting.  Especially noting she has been having multiple days of symptoms and knowing that she has a history of diabetic ketoacidosis.  She was encouraged to present to the emergency room for hydration and insulin administration.  At that time, further lab testing can be done in admission can be considered.  She was not pleased but would agree to being seen and treated in the emergency room.

## 2020-07-07 NOTE — ED Triage Notes (Signed)
Glucose 518. Notified charge.

## 2020-07-07 NOTE — Patient Instructions (Signed)
Hyperglycemia: It looks like your diabetes is very poorly controlled today.  In the clinic, your capillary blood glucose was 493 and your A1c was over 11.  Based on the fact that you have been having symptoms for the last 5-6 days, I think that you need to be seen in the emergency room for hydration and insulin.  Its possible you may need to be admitted.

## 2020-07-07 NOTE — Progress Notes (Signed)
    SUBJECTIVE:   CHIEF COMPLAINT / HPI:   Hyperglycemia Morgan [name] reports that she has been feeling unwell for the past 5-6 days.  She generally feels symptoms of fatigue, abdominal discomfort, nausea and nonbloody vomiting.  She notes that she has been having several episodes of bilious vomiting daily.  She notes that this feels very similar to her previous episodes of diabetic ketoacidosis and she is concerned she may again be going into ketoacidosis.  She has a limited supply of glucose test strips at home and has not been checking regularly.  The last 3 measurements she is taken for blood glucose were unreadable high, unreasonably high and something in the 400s.  She is not positive when she last took these measurements it was sometime between Wednesday-Friday of this past week.  She reports that her current diabetic regimen includes: Lantus 60 units twice daily Humalog 50 units 3 times daily (sometimes she takes this 4 times daily)  She was really hoping to catch this hyperglycemia early so that she could avoid hospitalization.  PERTINENT  PMH / PSH: Diabetes, type II, history of DKA  OBJECTIVE:   BP 122/78   Pulse 83   Temp 98.7 F (37.1 C) (Oral)   LMP 08/03/2013   SpO2 98%    General: Alert and cooperative and appears to be in no acute distress HEENT: Dry mucous membranes.  Oropharynx normal.  No cervical lymphadenopathy. Cardio: Normal S1 and S2, no S3 or S4. Rhythm is regular. No murmurs or rubs.  Not tachycardic. Pulm: Clear to auscultation bilaterally, no crackles, wheezing, or diminished breath sounds. Normal respiratory effort Abdomen: Bowel sounds normal.  Significantly tender to palpation around the epigastrium which quickly induced nausea. Extremities: No peripheral edema. Warm/ well perfused.  Strong radial pulse.  Normal cap refill.  No skin tenting.  CBG: 493 A1c: 11.9  ASSESSMENT/PLAN:   Hyperglycemia due to diabetes mellitus [facility]) Despite her desire to  avoid the emergency room, she was informed that she is too far progressed to handle this in the outpatient setting.  Given her history of impressive insulin resistance using a total of 270 units of insulin daily and difficulty staying compliant with regular blood glucose checks and documentation, I do not feel comfortable managing this in the outpatient setting.  Especially noting she has been having multiple days of symptoms and knowing that she has a history of diabetic ketoacidosis.  She was encouraged to present to the emergency room for hydration and insulin administration.  At that time, further lab testing can be done in admission can be considered.  She was not pleased but would agree to being seen and treated in the emergency room.     Morgan Haymaker, MD [place]

## 2020-07-07 NOTE — Progress Notes (Deleted)
    SUBJECTIVE:   CHIEF COMPLAINT / HPI:   ***  PERTINENT  PMH / PSH: ***  OBJECTIVE:   BP 122/78   Pulse 83   Temp 98.7 F (37.1 C) (Oral)   LMP 08/03/2013   SpO2 98%   ***  ASSESSMENT/PLAN:   No problem-specific Assessment & Plan notes found for this encounter.     Matilde Haymaker, MD [place]

## 2020-07-08 ENCOUNTER — Other Ambulatory Visit: Payer: Self-pay

## 2020-07-08 ENCOUNTER — Observation Stay (HOSPITAL_COMMUNITY): Payer: PRIVATE HEALTH INSURANCE

## 2020-07-08 ENCOUNTER — Emergency Department (HOSPITAL_COMMUNITY): Payer: PRIVATE HEALTH INSURANCE

## 2020-07-08 ENCOUNTER — Encounter (HOSPITAL_COMMUNITY): Payer: Self-pay | Admitting: Family Medicine

## 2020-07-08 DIAGNOSIS — R739 Hyperglycemia, unspecified: Secondary | ICD-10-CM | POA: Diagnosis present

## 2020-07-08 DIAGNOSIS — E1165 Type 2 diabetes mellitus with hyperglycemia: Secondary | ICD-10-CM | POA: Diagnosis present

## 2020-07-08 LAB — RENAL FUNCTION PANEL
Albumin: 3.2 g/dL — ABNORMAL LOW (ref 3.5–5.0)
Anion gap: 11 (ref 5–15)
BUN: 9 mg/dL (ref 6–20)
CO2: 24 mmol/L (ref 22–32)
Calcium: 8.6 mg/dL — ABNORMAL LOW (ref 8.9–10.3)
Chloride: 100 mmol/L (ref 98–111)
Creatinine, Ser: 0.6 mg/dL (ref 0.44–1.00)
GFR, Estimated: >60 mL/min (ref >60)
Glucose, Bld: 298 mg/dL — ABNORMAL HIGH (ref 70–99)
Phosphorus: 2.3 mg/dL — ABNORMAL LOW (ref 2.5–4.6)
Potassium: 4.3 mmol/L (ref 3.5–5.1)
Sodium: 135 mmol/L (ref 135–145)

## 2020-07-08 LAB — CBG MONITORING, ED
Glucose-Capillary: 433 mg/dL — ABNORMAL HIGH (ref 70–99)
Glucose-Capillary: 377 mg/dL — ABNORMAL HIGH (ref 70–99)
Glucose-Capillary: 331 mg/dL — ABNORMAL HIGH (ref 70–99)

## 2020-07-08 LAB — CBC
HCT: 43.4 % (ref 36.0–46.0)
Hemoglobin: 12.9 g/dL (ref 12.0–15.0)
MCH: 19.3 pg — ABNORMAL LOW (ref 26.0–34.0)
MCHC: 29.7 g/dL — ABNORMAL LOW (ref 30.0–36.0)
MCV: 65 fL — ABNORMAL LOW (ref 80.0–100.0)
Platelets: 283 10*3/uL (ref 150–400)
RBC: 6.68 MIL/uL — ABNORMAL HIGH (ref 3.87–5.11)
RDW: 19 % — ABNORMAL HIGH (ref 11.5–15.5)
WBC: 13.2 10*3/uL — ABNORMAL HIGH (ref 4.0–10.5)
nRBC: 0 % (ref 0.0–0.2)

## 2020-07-08 LAB — RESP PANEL BY RT-PCR (FLU A&B, COVID) ARPGX2
Influenza A by PCR: NEGATIVE
Influenza B by PCR: NEGATIVE
SARS Coronavirus 2 by RT PCR: NEGATIVE

## 2020-07-08 LAB — PREGNANCY, URINE: Preg Test, Ur: NEGATIVE

## 2020-07-08 LAB — TSH: TSH: 2.016 u[IU]/mL (ref 0.350–4.500)

## 2020-07-08 LAB — GLUCOSE, CAPILLARY: Glucose-Capillary: 253 mg/dL — ABNORMAL HIGH (ref 70–99)

## 2020-07-08 MED ORDER — TRAZODONE HCL 50 MG PO TABS
50.0000 mg | ORAL_TABLET | Freq: Every day | ORAL | Status: DC
  Administered 2020-07-08: 50 mg via ORAL
  Filled 2020-07-08: qty 1

## 2020-07-08 MED ORDER — SODIUM CHLORIDE 0.9 % IV BOLUS
1000.0000 mL | Freq: Once | INTRAVENOUS | Status: AC
  Administered 2020-07-08: 1000 mL via INTRAVENOUS

## 2020-07-08 MED ORDER — INSULIN ASPART 100 UNIT/ML ~~LOC~~ SOLN: 20.0000 [IU] | Freq: Once | SUBCUTANEOUS | Status: DC

## 2020-07-08 MED ORDER — METOPROLOL TARTRATE 50 MG PO TABS
50.0000 mg | ORAL_TABLET | Freq: Two times a day (BID) | ORAL | Status: DC
  Administered 2020-07-08 – 2020-07-09 (×2): 50 mg via ORAL
  Filled 2020-07-08 (×2): qty 1

## 2020-07-08 MED ORDER — ASPIRIN EC 81 MG PO TBEC
81.0000 mg | DELAYED_RELEASE_TABLET | Freq: Every day | ORAL | Status: DC
  Administered 2020-07-08 – 2020-07-09 (×2): 81 mg via ORAL
  Filled 2020-07-08 (×2): qty 1

## 2020-07-08 MED ORDER — PANTOPRAZOLE SODIUM 40 MG PO TBEC
40.0000 mg | DELAYED_RELEASE_TABLET | Freq: Two times a day (BID) | ORAL | Status: DC
  Administered 2020-07-09: 40 mg via ORAL
  Filled 2020-07-08: qty 1

## 2020-07-08 MED ORDER — TOPIRAMATE 25 MG PO TABS
75.0000 mg | ORAL_TABLET | Freq: Every day | ORAL | Status: DC
  Administered 2020-07-08: 75 mg via ORAL
  Filled 2020-07-08: qty 3

## 2020-07-08 MED ORDER — ALBUTEROL SULFATE HFA 108 (90 BASE) MCG/ACT IN AERS: 2.0000 | INHALATION_SPRAY | Freq: Four times a day (QID) | RESPIRATORY_TRACT | Status: DC | PRN

## 2020-07-08 MED ORDER — ENOXAPARIN SODIUM 60 MG/0.6ML ~~LOC~~ SOLN
50.0000 mg | SUBCUTANEOUS | Status: DC
  Administered 2020-07-08: 50 mg via SUBCUTANEOUS
  Filled 2020-07-08: qty 0.5

## 2020-07-08 MED ORDER — ONDANSETRON HCL 4 MG/2ML IJ SOLN
4.0000 mg | Freq: Four times a day (QID) | INTRAMUSCULAR | Status: DC | PRN
  Administered 2020-07-08: 4 mg via INTRAVENOUS
  Filled 2020-07-08: qty 2

## 2020-07-08 MED ORDER — LAMOTRIGINE 100 MG PO TABS
200.0000 mg | ORAL_TABLET | Freq: Every day | ORAL | Status: DC
  Administered 2020-07-08 – 2020-07-09 (×2): 200 mg via ORAL
  Filled 2020-07-08 (×3): qty 2

## 2020-07-08 MED ORDER — LEVOTHYROXINE SODIUM 75 MCG PO TABS
150.0000 ug | ORAL_TABLET | Freq: Every day | ORAL | Status: DC
  Administered 2020-07-09: 150 ug via ORAL
  Filled 2020-07-08: qty 6

## 2020-07-08 MED ORDER — INSULIN ASPART 100 UNIT/ML ~~LOC~~ SOLN
0.0000 [IU] | Freq: Every day | SUBCUTANEOUS | Status: DC
  Administered 2020-07-08: 3 [IU] via SUBCUTANEOUS

## 2020-07-08 MED ORDER — LURASIDONE HCL 20 MG PO TABS
60.0000 mg | ORAL_TABLET | Freq: Every day | ORAL | Status: DC
  Administered 2020-07-08: 60 mg via ORAL
  Filled 2020-07-08 (×2): qty 3

## 2020-07-08 MED ORDER — FLUTICASONE FUROATE-VILANTEROL 200-25 MCG/INH IN AEPB
1.0000 | INHALATION_SPRAY | Freq: Every day | RESPIRATORY_TRACT | Status: DC
  Administered 2020-07-09: 1 via RESPIRATORY_TRACT
  Filled 2020-07-08: qty 28

## 2020-07-08 MED ORDER — VENLAFAXINE HCL ER 75 MG PO CP24
150.0000 mg | ORAL_CAPSULE | Freq: Every day | ORAL | Status: DC
  Administered 2020-07-08 – 2020-07-09 (×2): 150 mg via ORAL
  Filled 2020-07-08: qty 2
  Filled 2020-07-08 (×2): qty 1
  Filled 2020-07-08: qty 2

## 2020-07-08 MED ORDER — PNEUMOCOCCAL VAC POLYVALENT 25 MCG/0.5ML IJ INJ
0.5000 mL | INJECTION | INTRAMUSCULAR | Status: AC
  Administered 2020-07-09: 0.5 mL via INTRAMUSCULAR
  Filled 2020-07-08: qty 0.5

## 2020-07-08 MED ORDER — INSULIN GLARGINE 100 UNIT/ML ~~LOC~~ SOLN
30.0000 [IU] | Freq: Every day | SUBCUTANEOUS | Status: DC
  Administered 2020-07-08 – 2020-07-09 (×2): 30 [IU] via SUBCUTANEOUS
  Filled 2020-07-08 (×2): qty 0.3

## 2020-07-08 MED ORDER — ONDANSETRON HCL 4 MG PO TABS: 4.0000 mg | ORAL_TABLET | Freq: Four times a day (QID) | ORAL | Status: DC | PRN

## 2020-07-08 MED ORDER — ATORVASTATIN CALCIUM 80 MG PO TABS
80.0000 mg | ORAL_TABLET | Freq: Every day | ORAL | Status: DC
  Administered 2020-07-08 – 2020-07-09 (×2): 80 mg via ORAL
  Filled 2020-07-08 (×2): qty 1

## 2020-07-08 MED ORDER — LOSARTAN POTASSIUM 50 MG PO TABS
100.0000 mg | ORAL_TABLET | Freq: Every day | ORAL | Status: DC
  Administered 2020-07-08 – 2020-07-09 (×2): 100 mg via ORAL
  Filled 2020-07-08 (×2): qty 2

## 2020-07-08 MED ORDER — INSULIN ASPART 100 UNIT/ML ~~LOC~~ SOLN
0.0000 [IU] | Freq: Three times a day (TID) | SUBCUTANEOUS | Status: DC
  Administered 2020-07-08 – 2020-07-09 (×2): 11 [IU] via SUBCUTANEOUS
  Administered 2020-07-09: 15 [IU] via SUBCUTANEOUS

## 2020-07-08 MED ORDER — TERBINAFINE HCL 1 % EX CREA
TOPICAL_CREAM | Freq: Every day | CUTANEOUS | Status: DC
  Administered 2020-07-09: 1 via TOPICAL
  Filled 2020-07-08: qty 12

## 2020-07-08 NOTE — ED Provider Notes (Signed)
[facility] EMERGENCY DEPARTMENT Provider Note   CSN: [id number] Arrival date & time: 07/07/20  4034     History Chief Complaint  Patient presents with  . Hyperglycemia    Morgan [name] is a 54 y.o. female.  HPI Patient is a 54 year old female with a lengthy past medical history detailed below presented today for elevated blood sugars greater than 400 on her home glucometer. She states that she is a type II diabetic who is a history of "DKA ". She states that she has had a cough for approximately 2 weeks which has been productive of some clear sputum/yellow sputum. States that she has not felt significantly short of breath she has had no fevers no chest pain no lightheadedness or dizziness. She states however that she has had dry heaves over the past 1 week states that she has been able to keep food down but only with difficulty. She has been taking her insulin as prescribed. She takes Lantus 60 units twice daily as her long-acting and Humalog 50 units per meal 3-4 times per day.  Although she is prescribed oral antiglycemic medicine she states that she does not take these because she has been unable to afford them.  She denies any chest pain, she states she cannot be pregnant as she had a abdominal hysterectomy years ago, she denies any drug or alcohol use.  No other significant associated symptoms. No aggravating or mitigating factors. She has tried no medications at home prior to arrival.     Past Medical History:  Diagnosis Date  . Acute systolic heart failure ([place])   . Anxiety   . Asthma   . Bipolar depression ([place])   . Carpal tunnel syndrome, bilateral 09/07/2019  . Chronic bronchitis ([place])   . Chronic esophagogastric ulcer   . Chronic stomach ulcer   . Diabetic ketoacidosis without coma associated with type 2 diabetes mellitus ([place]) 05/12/2019  . Diarrhea 12/25/2019  . Dysequilibrium 04/03/2020  . Elevated transaminase level 07/21/2018  . Encounter for  screening colonoscopy 04/03/2020  . Encounter for screening mammogram for malignant neoplasm of breast 04/03/2020  . Esophagitis determined by endoscopy   . Extravasation accident, initial encounter 07/21/2018  . Fibroid   . GERD (gastroesophageal reflux disease)   . Heart murmur   . Hepatic steatosis   . History of blood transfusion 2010   "related to subclavian stent"  . History of hiatal hernia   . Hyperlipidemia   . Hypertension   . Hypothyroid   . LFTs abnormal   . Migraine    "cerebral migraines; 1-2/month" (06/29/2018)  . Nausea 08/09/2018  . Need for immunization against influenza 04/03/2020  . Non-ST elevation (NSTEMI) myocardial infarction ([place]) 10/27/2018  . 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 SOB (shortness of breath)   . 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 Stenosis of left subclavian artery ([place]) 06/05/2019   Left subclavian artery stenosis. left upper extremity claudication and subclavian steal symptoms. Carotid Dopplers performed 04/17/2019 with suggestion of occluded left subclavian artery with retrograde vertebral filling and subsequent CT angiogram performed 04/27/2019 with confirmation -Admitted for elective PV angiogram in which recommendations were for a left common carotid to subclavian bypass.  . Stroke ([place])    "I've had 2; most recent one was ~ 2015 or before; affected balance" (06/29/2018)  . 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 Thalassemia minor   . Type II diabetes mellitus ([place])   . Vitamin D deficiency  Patient Active Problem List   Diagnosis Date Noted  . Hyperglycemia 07/08/2020  . No-show for appointment 05/12/2020  . Cervical spinal stenosis 10/09/2019  . Status post carotid endarterectomy 07/30/2019  . Subclavian artery stenosis, left ([place]) 07/30/2019  . Migraines 07/16/2019  . Vitamin D deficiency 05/19/2019  . Hyperglycemia due to diabetes mellitus ([place]) 11/17/2018  . S/P coronary artery stent placement   . Hyperlipidemia LDL  goal <70   . Hepatic steatosis   . Mild renal insufficiency 07/21/2018  . Bipolar depression ([place]) 07/21/2018  . Incidental lung nodule 07/21/2018  . Prolonged QT interval 07/21/2018  . Hyperlipidemia 05/14/2016  . Hypothyroidism 05/14/2016  . Essential hypertension 05/14/2016    Past Surgical History:  Procedure Laterality Date  . ABDOMINAL HYSTERECTOMY  2015   "still have 1 ovary"  . AORTIC ARCH ANGIOGRAPHY  06/14/2019  . AORTIC ARCH ANGIOGRAPHY N/A 06/14/2019   Procedure: AORTIC ARCH ANGIOGRAPHY;  Surgeon: Lorretta Harp, MD;  Location: [facility] CV LAB;  Service: Cardiovascular;  Laterality: N/A;  . BIOPSY  07/25/2018   Procedure: BIOPSY;  Surgeon: Thornton Park, MD;  Location: [facility] ENDOSCOPY;  Service: Gastroenterology;;  . CAROTID-SUBCLAVIAN BYPASS GRAFT Left 07/30/2019   Procedure: LEFT CAROTID-SUBCLAVIAN BYPASS GRAFT;  Surgeon: Marty Heck, MD;  Location: [facility];  Service: Vascular;  Laterality: Left;  . CESAREAN SECTION  1988; 1996  . CORONARY STENT INTERVENTION N/A 10/27/2018   Procedure: CORONARY STENT INTERVENTION;  Surgeon: Sherren Mocha, MD;  Location: [facility] CV LAB;  Service: Cardiovascular;  Laterality: N/A;  . ESOPHAGOGASTRODUODENOSCOPY (EGD) WITH PROPOFOL N/A 07/25/2018   Procedure: ESOPHAGOGASTRODUODENOSCOPY (EGD) WITH PROPOFOL;  Surgeon: Thornton Park, MD;  Location: [facility];  Service: Gastroenterology;  Laterality: N/A;  . KNEE RECONSTRUCTION Left 1980  . LAPAROSCOPIC CHOLECYSTECTOMY    . LEFT HEART CATH AND CORONARY ANGIOGRAPHY N/A 10/27/2018   Procedure: LEFT HEART CATH AND CORONARY ANGIOGRAPHY;  Surgeon: Sherren Mocha, MD;  Location: [facility] CV LAB;  Service: Cardiovascular;  Laterality: N/A;  . SUBCLAVIAN STENT PLACEMENT Left 2010  . TONSILLECTOMY  1970  . UPPER EXTREMITY ANGIOGRAPHY Right 06/14/2019   Procedure: UPPER EXTREMITY ANGIOGRAPHY;  Surgeon: Lorretta Harp, MD;  Location: [facility] CV LAB;  Service:  Cardiovascular;  Laterality: Right;  upper ext     OB History    Gravida  6   Para  2   Term  2   Preterm      AB  4   Living  2     SAB  4   TAB      Ectopic      Multiple      Live Births              Family History  Problem Relation Age of Onset  . Diabetes Mother   . Hypertension Mother   . Hyperlipidemia Mother   . CAD Mother   . Cervical cancer Mother   . CAD Father   . Heart failure Father   . Testicular cancer Father   . Hypertension Sister   . Hyperlipidemia Sister   . Hypertension Brother   . Hyperlipidemia Brother     Social History   Tobacco Use  . Smoking status: Former Smoker    Packs/day: 2.00    Years: 36.00    Pack years: 72.00    Types: Cigarettes    Quit date: 05/14/2014    Years since quitting: 6.1  . Smokeless tobacco: Never Used  Vaping Use  .  Vaping Use: Former  Substance Use Topics  . Alcohol use: Not Currently  . Drug use: Not Currently    Home Medications Prior to Admission medications   Medication Sig Start Date End Date Taking? Authorizing Provider  Accu-Chek FastClix Lancets MISC TEST 3 TIMES DAILY 01/24/20  Yes Enid Derry, [place], DO  ADVAIR [facility] 115-21 MCG/ACT inhaler USE 2 INHALATIONS BY MOUTH  TWICE DAILY Patient taking differently: Inhale 2 puffs into the lungs 2 (two) times daily.  12/27/19  Yes Enid Derry, [place], DO  albuterol (VENTOLIN HFA) 108 (90 Base) MCG/ACT inhaler USE 2 TO 4 INHALATIONS BY  MOUTH EVERY 4 HOURS AS  NEEDED MANUFACTURER  RECOMMENDS NOT EXCEEDING 12 INH/DAY Patient taking differently: 2-4 puffs every 4 (four) hours as needed for wheezing or shortness of breath.  03/10/20  Yes [name], DO  aspirin 81 MG tablet Take 1 tablet (81 mg total) by mouth daily. 10/31/18  Yes Regalado, Belkys A, MD  atorvastatin (LIPITOR) 80 MG tablet Take 1 tablet (80 mg total) by mouth daily. 05/19/20  Yes Kinnie Feil, MD  Blood Glucose Monitoring Suppl (CONTOUR NEXT ONE) KIT 1 kit by Does not apply route  as directed. 07/02/18  Yes Patrecia Pour, MD  Cholecalciferol (VITAMIN D3) 25 MCG (1000 UT) CAPS Take 1,000-2,000 Units by mouth See admin instructions. Take 2 tablets by mouth once a day on Mon/Wed/Fri and take 1 tablet on Sun/Tues/Thurs/Sat   Yes [provider]  Continuous Blood Gluc Sensor (DEXCOM G6 SENSOR) MISC Inject 1 applicator into the skin as directed. Change sensor every 10 days. 06/20/20  Yes Leavy Cella, RPH-CPP  Continuous Blood Gluc Transmit (DEXCOM G6 TRANSMITTER) MISC Inject 1 Device into the skin as directed. Reuse 8 times with sensor changes. 04/24/20  Yes Leavy Cella, RPH-CPP  EPINEPHrine 0.3 mg/0.3 mL IJ SOAJ injection Inject 0.3 mg into the muscle once as needed for anaphylaxis (for an anaphylactic reaction). 04/24/20  Yes Leavy Cella, RPH-CPP  furosemide (LASIX) 20 MG tablet Take 1 tablet (20 mg total) by mouth 2 (two) times daily. 06/20/20  Yes Leavy Cella, RPH-CPP  Glucagon, rDNA, (GLUCAGON EMERGENCY) 1 MG KIT Inject 1 mg into the vein once as needed (FOR ONSET OF HYPOGLYCEMIA). 02/13/20  Yes Milus Banister C, DO  glucose blood (CONTOUR NEXT TEST) test strip Please use to check blood sugar up to four times daily. Dx code: E11.65 07/07/20  Yes Matilde Haymaker, MD  insulin glargine (LANTUS SOLOSTAR) 100 UNIT/ML Solostar Pen Inject 60 Units into the skin every morning AND 60 Units at bedtime. Patient taking differently: Inject 60 Units into the skin 2 (two) times daily.  06/20/20  Yes Leavy Cella, RPH-CPP  insulin lispro (HUMALOG KWIKPEN) 100 UNIT/ML KwikPen Inject 40 Units into the skin 4 (four) times daily. Patient taking differently: Inject 50 Units into the skin 4 (four) times daily.  06/20/20  Yes Leavy Cella, RPH-CPP  Insulin Pen Needle (PEN NEEDLES) 32G X 4 MM MISC 1 Device by Does not apply route 5 (five) times daily. 07/20/19  Yes Shamleffer, Melanie Crazier, MD  lamoTRIgine (LAMICTAL) 200 MG tablet Take 1 tablet (200 mg total) by mouth daily.  05/19/20  Yes [name], DO  levothyroxine (SYNTHROID) 150 MCG tablet Take 1 tablet (150 mcg total) by mouth at bedtime. 3:30 Am Patient taking differently: Take 150 mcg by mouth daily before breakfast. 3:30 Am 05/19/20  Yes Kinnie Feil, MD  losartan (COZAAR) 100 MG tablet Take  1 tablet (100 mg total) by mouth daily. 05/19/20  Yes Kinnie Feil, MD  Lurasidone HCl (LATUDA) 60 MG TABS Take 1 tablet (60 mg total) by mouth at bedtime. 05/19/20  Yes [name], DO  metoCLOPramide (REGLAN) 5 MG tablet Take 1 tablet (5 mg total) by mouth every 6 (six) hours as needed for nausea or vomiting. 12/25/19  Yes Milus Banister C, DO  metoprolol tartrate (LOPRESSOR) 50 MG tablet Take 1 tablet (50 mg total) by mouth 2 (two) times daily. 05/19/20  Yes Kinnie Feil, MD  Multiple Vitamin (MULTIVITAMIN WITH MINERALS) TABS tablet Take 1 tablet by mouth daily. Woman 86 +   Yes [provider]  nitroGLYCERIN (NITROSTAT) 0.4 MG SL tablet Place 1 tablet (0.4 mg total) under the tongue every 5 (five) minutes x 3 doses as needed for chest pain. 04/24/20  Yes Leavy Cella, RPH-CPP  nystatin (MYCOSTATIN/NYSTOP) powder Apply 1 application topically daily as needed (infection). 02/13/20  Yes [name], DO  nystatin ointment (MYCOSTATIN) Apply 1 application topically daily as needed (infection). 02/13/20  Yes Milus Banister C, DO  pantoprazole (PROTONIX) 40 MG tablet Take 1 tablet (40 mg total) by mouth 2 (two) times daily before a meal. X 8 weeks Patient taking differently: Take 40 mg by mouth 2 (two) times daily.  07/26/18  Yes Rai, Ripudeep K, MD  potassium chloride SA (KLOR-CON) 20 MEQ tablet Take 1 tablet (20 mEq total) by mouth daily. 07/04/20  Yes Milus Banister C, DO  SUMAtriptan (IMITREX) 25 MG tablet  TAKE 1 TABLET BY MOUTH EVERY 2 HOURS AS NEEDED FOR MIGRAINE. MAY REPEAT IN 2 HOURS IF HEADACHE PERSISTS OR RECURS. PLEASE TAKE AS SOON AS YOU FEEL YOUR MIGRAINE STARTING.   Patient taking differently: Take 25 mg by mouth every 2 (two) hours as needed for migraine or headache. May repeat in 2 hours. 12/27/19  Yes Enid Derry, [place], DO  topiramate (TOPAMAX) 25 MG tablet Take 1 tablet (25 mg total) by mouth at bedtime. Patient taking differently: Take 25 mg by mouth at bedtime. Take with 71m tablet for a total of 755m 06/20/20  Yes KoLeavy CellaRPH-CPP  topiramate (TOPAMAX) 50 MG tablet Take 1 tablet (50 mg total) by mouth at bedtime. Patient taking differently: Take 50 mg by mouth at bedtime. Take with 2571mablet for a total of 27m27m1/19/21  Yes KovaLeavy CellaH-CPP  traZODone (DESYREL) 50 MG tablet Take 50 mg by mouth at bedtime.  04/02/20  Yes [provider]  venlafaxine XR (EFFEXOR-XR) 150 MG 24 hr capsule Take 150 mg by mouth daily. 06/17/20  Yes [provider]  diphenoxylate-atropine (LOMOTIL) 2.5-0.025 MG tablet Take 1 tablet by mouth 4 (four) times daily as needed for diarrhea or loose stools. Patient not taking: Reported on 05/08/2020 12/25/19   AndeDaisy Floro  empagliflozin (JARDIANCE) 25 MG TABS tablet Take 1 tablet (25 mg total) by mouth daily. Patient not taking: Reported on 05/08/2020 04/03/20   AndeDaisy Floro    Allergies    Ilosone [erythromycin], Keflex [cephalexin], Penicillins, Iron, Metformin and related, and Morphine and related  Review of Systems   Review of Systems  Constitutional: Positive for fatigue. Negative for chills and fever.  HENT: Negative for congestion.   Eyes: Negative for pain.  Respiratory: Negative for cough and shortness of breath.   Cardiovascular: Negative for chest pain and leg swelling.  Gastrointestinal: Positive for abdominal pain (Achy abdominal pain while vomiting), nausea  and vomiting. Negative for diarrhea.  Genitourinary: Negative for dysuria, vaginal bleeding, vaginal discharge and vaginal pain.  Musculoskeletal: Negative for myalgias.  Skin: Negative for rash.   Neurological: Negative for dizziness and headaches.  Psychiatric/Behavioral: Negative for agitation.    Physical Exam Updated Vital Signs BP (!) 184/91   Pulse 98   Temp 97.8 F (36.6 C)   Resp 19   Ht '5\' 3"'  (1.6 m)   Wt 101.2 kg   LMP 08/03/2013   SpO2 96%   BMI 39.52 kg/m   Physical Exam Vitals and nursing note reviewed.  Constitutional:      General: She is not in acute distress. HENT:     Head: Normocephalic and atraumatic.     Nose: Nose normal.  Eyes:     General: No scleral icterus. Neck:     Comments: No JVP Cardiovascular:     Rate and Rhythm: Normal rate and regular rhythm.     Pulses: Normal pulses.     Heart sounds: Normal heart sounds.  Pulmonary:     Effort: Pulmonary effort is normal. No respiratory distress.     Breath sounds: No wheezing.  Abdominal:     Palpations: Abdomen is soft.     Tenderness: There is no abdominal tenderness.  Musculoskeletal:     Cervical back: Normal range of motion.     Right lower leg: No edema.     Left lower leg: No edema.  Skin:    General: Skin is warm and dry.     Capillary Refill: Capillary refill takes less than 2 seconds.  Neurological:     Mental Status: She is alert. Mental status is at baseline.  Psychiatric:        Mood and Affect: Mood normal.        Behavior: Behavior normal.     ED Results / Procedures / Treatments   Labs (all labs ordered are listed, but only abnormal results are displayed) Labs Reviewed  BASIC METABOLIC PANEL - Abnormal; Notable for the following components:      Result Value   Sodium 134 (*)    Glucose, Bld 518 (*)    All other components within normal limits  CBC - Abnormal; Notable for the following components:   WBC 12.2 (*)    RBC 6.75 (*)    MCV 65.5 (*)    MCH 20.1 (*)    RDW 19.4 (*)    All other components within normal limits  CBG MONITORING, ED - Abnormal; Notable for the following components:   Glucose-Capillary 433 (*)    All other components within  normal limits  I-STAT BETA HCG BLOOD, ED ([facility], [facility], [facility] ONLY) - Abnormal; Notable for the following components:   I-stat hCG, quantitative 6.9 (*)    All other components within normal limits  CBG MONITORING, ED - Abnormal; Notable for the following components:   Glucose-Capillary 377 (*)    All other components within normal limits  RESP PANEL BY RT-PCR (FLU A&B, COVID) ARPGX2  PREGNANCY, URINE  URINALYSIS, ROUTINE W REFLEX MICROSCOPIC  TSH    EKG EKG Interpretation  Date/Time:  Tuesday July 08 2020 11:29:56 EST Ventricular Rate:  94 PR Interval:    QRS Duration: 92 QT Interval:  368 QTC Calculation: 461 R Axis:   47 Text Interpretation: Sinus rhythm qt duration decreased since last tracing Confirmed by Dorie Rank [phone]) on 07/08/2020 11:52:10 AM   Radiology DG Chest Portable 1 View  Result Date: 07/08/2020 CLINICAL  DATA:  Cough. EXAM: PORTABLE CHEST 1 VIEW COMPARISON:  November 24, 2019. FINDINGS: Elevated left hemidiaphragm. Left basilar atelectasis versus consolidation. No visible pleural effusions or pneumothorax. Similar cardiomediastinal contour. Left lower neck clips. No acute osseous abnormality. IMPRESSION: Similar elevated left hemidiaphragm with left basilar atelectasis versus consolidation. Electronically Signed   By: Margaretha Sheffield MD   On: 07/08/2020 10:24    Procedures Procedures (including critical care time)  Medications Ordered in ED Medications  insulin glargine (LANTUS) injection 30 Units (30 Units Subcutaneous Given 07/08/20 1348)  insulin aspart (novoLOG) injection 0-5 Units (has no administration in time range)  enoxaparin (LOVENOX) injection 50 mg (50 mg Subcutaneous Given 07/08/20 1346)  ondansetron (ZOFRAN) tablet 4 mg ( Oral See Alternative 07/08/20 1354)    Or  ondansetron (ZOFRAN) injection 4 mg (4 mg Intravenous Given 07/08/20 1354)  insulin aspart (novoLOG) injection 0-15 Units (has no administration in time range)  sodium chloride 0.9 % bolus  1,000 mL (0 mLs Intravenous Stopped 07/08/20 1201)  sodium chloride 0.9 % bolus 1,000 mL (0 mLs Intravenous Stopped 07/08/20 1508)    ED Course  I have reviewed the triage vital signs and the nursing notes.  Pertinent labs & imaging results that were available during my care of the patient were reviewed by me and considered in my medical decision making (see chart for details).   Patient is a 54 year old female with numerous medical conditions presented today with hyperglycemia.  BMP glucose 518 no significant hyponatremia once corrected for her hyperglycemia.  CBC with very mild leukocytosis she does appear dehydrated which is consistent with her elevated WBC and RBC.  This may be all secondary to hemoconcentration.  I-STAT hCG is marginally elevated however she has had an abdominal hysterectomy Covid test negative.  Patient is cared for by family medicine service will discuss with them as to whether they would prefer admission or discharge with close follow-up.  Clinical Course as of Jul 08 1530  Tue Jul 08, 2020  1036 Discussed with family medicine resident who will assess patient at bedside    [WF]    Clinical Course User Index [WF] Tedd Sias, [place]   MDM Rules/Calculators/A&P                          Patient admitted to the family medicine resident service.  Final Clinical Impression(s) / ED Diagnoses Final diagnoses:  None    Rx / DC Orders ED Discharge Orders    None       Tedd Sias, [place] 07/08/20 1531    Dorie Rank, MD 07/09/20 [phone]

## 2020-07-08 NOTE — Progress Notes (Signed)
FPTS Interim Progress Note  S: Per day team request, visited this patient after evening checkout.  Upon entering the room, she was found sitting up in bed with an empty meal tray in front of her. She reports feeling well.  She reports nausea but that she still ate her meal because she was also very hungry.  No vomiting.  Still has abdominal pain, mostly in epigastric region.  Patient also wanted to make sure she was okay to take a shower.  Reports no dizziness or lightheadedness and feels steady on her feet.  O: BP (!) 146/87 (BP Location: Right Arm)   Pulse (!) 101   Temp 98.7 F (37.1 C) (Oral)   Resp 18   Ht 5\' 3"  (1.6 m)   Wt 101.2 kg   LMP 08/03/2013   SpO2 96%   BMI 39.52 kg/m    General: Awake, alert, oriented, no acute distress Respiratory: Normal respiratory effort, no respiratory distress Abdomen: TTP in all quadrants, worst in epigastric region.  No overlying skin changes, warmth, swelling.  Abdomen flat, soft, nondistended.  A/P: -We will continue to monitor overnight -Antiemetic as needed -Please page number below with any concerns or questions -Patient is fine to take a shower without assistance  Ezequiel Essex, MD 07/08/2020, 10:19 PM PGY-1, [facility] Medicine Service pager [phone]

## 2020-07-08 NOTE — Plan of Care (Signed)

## 2020-07-08 NOTE — Progress Notes (Signed)
Inpatient Diabetes Program Recommendations  AACE/ADA: New Consensus Statement on Inpatient Glycemic Control (2015)  Target Ranges:  Prepandial:   less than 140 mg/dL      Peak postprandial:   less than 180 mg/dL (1-2 hours)      Critically ill patients:  140 - 180 mg/dL   Lab Results  Component Value Date   GLUCAP 377 (H) 07/08/2020   HGBA1C 11.9 (A) 07/07/2020    Review of Glycemic Control Results for Morgan [name], Morgan [name]" (MRN [id number]) as of 07/08/2020 12:20  Ref. Range 07/08/2020 03:58 07/08/2020 11:04  Glucose-Capillary Latest Ref Range: 70 - 99 mg/dL 433 (H) 377 (H)   Diabetes history: DM2 Outpatient Diabetes medications: Lantus 60 units bid + Humalog 40 units qid + Jardiance 25 units Current orders for Inpatient glycemic control: Lantus 30 units qd + Novolog correction 0-15 units tid + hs 0-5 units  Inpatient Diabetes Program Recommendations:   -Increase Lantus to 50% home dose = 30 units bid -Add Novolog 8 units meal coverage if eats 50% Secure chat sent to Dr. Carollee Leitz.  Thank you, Nani Gasser. Brody Bonneau, RN, MSN, CDE  Diabetes Coordinator Inpatient Glycemic Control Team Team Pager [phone] (8am-5pm) 07/08/2020 12:24 PM

## 2020-07-08 NOTE — Progress Notes (Incomplete)
Family Medicine Teaching Service Daily Progress Note Intern Pager: [phone]  Patient name: Morgan [name] Medical record number: [id number] Date of birth: 1965/10/21 Age: 54 y.o. Gender: female  Primary Care Provider: [name], DO Consultants: *** Code Status: Full  Pt Overview and Major Events to Date:  Morgan [name] is a 54 y.o. female that presented with hyperglycemia and leukocytosis. PMH significant for T2DM, Thalassemia minor, subclavian steal syndrome, CVA, h/o NSTEMI, bipolar disorder, hypothyroidism, HTN  Assessment and Plan:  *** Hyperglycemia without ketoacidosis Patient was admitted with a glucose of 518. Patient noted to have mild elevation of white blood cell count of 12. Normal anion gap of 12. A1c 11.9. - Continue Lantus 30U - Moderate SSI - Zofran as needed for nausea - Monitor CBGs - Diabetes education  Consolidation on CXR*** CXR notable for consolidation in the left lower lobe. No infectious symptoms currently. Consider atypical pneumonia.  HTN BP this morning was*** -Continue home metoprolol 50 mg, Cozaar 100 mg.  Hypothyroidism Patient's last known TSH was 2019, currently***. He will medication of levothyroxine 100 mcg daily - Continue home levothyroxine - Follow-up TSH  Bipolar disorder, stable - Continue home Latuda 60 mg nightly  Hx of CAD - Continue home medications***  FEN/GI: Carb modified PPx: Lovenox   Status is: Observation  {Observation:23811}  Dispo: The patient is from: {From:23814}              Anticipated d/c is to: {To:23815}              Anticipated d/c date is: {Days:23816}              Patient currently {Medically stable:23817}        Subjective:  ***  Objective: Temp:  [97.8 F (36.6 C)-98.7 F (37.1 C)] 98.7 F (37.1 C) (12/07 1817) Pulse Rate:  [85-103] 101 (12/07 1817) Resp:  [14-22] 18 (12/07 1817) BP: (110-184)/(52-96) 146/87 (12/07 1817) SpO2:  [93 %-98 %] 96 % (12/07 1817) Physical  Exam: General: *** Cardiovascular: *** Respiratory: *** Abdomen: *** Extremities: ***  Laboratory: Recent Labs  Lab 07/07/20 1634  WBC 12.2*  HGB 13.6  HCT 44.2  PLT 341   Recent Labs  Lab 07/07/20 1634  NA 134*  K 3.9  CL 99  CO2 23  BUN 13  CREATININE 0.75  CALCIUM 9.2  GLUCOSE 518*    ***  Imaging/Diagnostic Tests: Rise Patience, DO 07/08/2020, 6:25 PM PGY-1, [place] Intern pager: [phone], text pages welcome

## 2020-07-08 NOTE — ED Notes (Signed)
Pt ate half a [place] sandwich, two cheese blocks and saltine crackers

## 2020-07-08 NOTE — Progress Notes (Signed)
   FMTS Attending Brief Note: [name], MD  Personal pager:  [phone] [place] Service Pager:  [phone]   I  have seen and examined this patient, reviewed their chart. I have discussed this patient with the resident.I will sign resident note as available.  54 year old with history of poorly controlled type 2 diabetes, coronary does artery disease status post stent, hypothyroidism, subclavian steal with stent placement presenting with nausea and mild abdominal pain in the setting of hyperglycemia without ketoacidosis.  The patient reports that is her typical symptom when she has poorly controlled sugars.  She reports that this is improved since getting fluid and coming to the emergency room.  She denies dyspnea, chest pain, fevers, chills.  She does endorse one episode of diarrhea.  She denies headache or vision changes.  The patient reports that she has had difficulty with her insulin use over the last week as she recently changed jobs.  She has been missing breaks and thus missing multiple doses of insulin.   Reviewed her medical history which is most notable for cardiac stent in 2020 March.  Follow-up if echocardiogram showed a normal ejection fraction.  Type 2 diabetes appears to be difficult to control.  Her last TSH was in 2019.  Surgical history notable for subclavian stent for subclavian steal syndrome.  Socially she no longer drives.  Her son does live in her house with her.  She has 3 cats.  Labs are notable for hyperglycemia without anion gap and without acidosis..  Leukocytosis is noted.  A1c 11.9.  Urine pregnancy test negative.  Covid negative.  Chest x-ray notable for concern for consolidation in the left lower lobe. EKG shows normal sinus rhythm.  On exam the patient is pleasant.  She is eating crackers and cheese.  Regular rate and rhythm.  Lungs clear bilaterally although poor inspiratory effort.  Abdomen is distended soft minimally tender in epigastric area.  Lower extremities  without significant edema. 1. Hyperglycemia, symptomatic in the setting of poorly controlled diabetes due to difficulty with medications at home.  Will restart with 30 units of long-acting insulin and sliding scale.  Would benefit from diabetes education and could consider addition of other agents although cost has been a barrier for the patient in the past. 2. Leukocytosis, possibly due to hyperglycemia which could be reactive also consideration of pneumonia although the patient does not have symptoms suggestive of this.  Repeat two-view x-ray as below. 3. Consolidation on chest x-ray, no specifically infectious symptoms right now that her symptoms could be an atypical pneumonia.  Repeat two-view chest x-ray 4. Hypothyroidism last TSH in 2019, repeat in restart Synthroid 5. History of coronary artery disease we will continue home medications at this time.  We will sign the resident note as available.

## 2020-07-08 NOTE — H&P (Signed)
[facility] Admission History and Physical Service Pager: [phone]  Patient name: Morgan [name] Medical record number: [id number] Date of birth: February 27, 1966 Age: 54 y.o. Gender: female  Primary Care Provider: [name], DO Consultants: None Code Status: Full Preferred Emergency Contact: Ivor Reining [phone] (sister)  Chief Complaint: Dry heaves  Assessment and Plan: Morgan [name] is a 54 y.o. female presenting with 1 week dry heaves. PMH is significant for DM Type 2, Thalassemia minor, Subclavian steal syndrome, CVA, H/O NSTEMI, Bipolar, Hypothyroidism, HTN  Hyperglycemia Stable: Likely secondary to viral URI Patient having history of cough, runny nose and dry heaves since 11/30.  Afebrile and vital sign stable.  Chest xray negative for pneumonia.  Less likely bacterial cause of hyperglycemia.  Labs significant for serum glucose 518 on admission and mild elevation of WBC 12.2. -Admit for Observation, attending Dr Owens Shark -Start Lantus 30 u daily -mSS insulin -Average carb diet -Can give Zofran if continues to have nausea -monitor CBG -PT/OT for eval -Incentive spirometry while awake  HTN Stable Home mediation Metoprolol 50 mg, Cozaar 120m. Reports last took medications Wednesday. -Restart home medications   Hyperthyroidism Stable: Last TSH 2019.  Home medication Levothyroxine 100 mcg daily -Hold home medication in the setting of nausea -Restart Levothyroxine when patient able to take in po diet -Check TSH in am  Bipolar Stable: -Home medication Latuda 60 mg qhs  FEN/GI:  -IV Normal Saline 1L -Carb Modified  Prophylaxis:  -Lovenox  Disposition: Med Surg, attending Dr BOwens Shark History of Present Illness:  Morgan SHELTONis a 54y.o. female presenting with 1 week history of dry heaves and abdominal pain. Seen by PCP yesterday and was advised to come to ED for further evaluation.  She currently denies any chest pain, SOB.   She endorses nausea but has not vomited since in the ED.  Denies any recent sick contacts.  Reports cough and runny nose that start the same time as initial symptoms.  Denies any diarrhea, dysuria, bloody stool, or hematemesis.  In the ED she was afebrile and vss.  Labs significant for serum glucose 518<[phone]<277, with mild elevation of WBC WBC 12.2 and no acidosis.  At the time I had seen her she had not been given any fluids or insulin and glucose was trending downward.   Review Of Systems: Per HPI with the following additions:   Review of Systems  Constitutional: Negative for appetite change, chills, fever and unexpected weight change.  HENT: Negative for sore throat.   Respiratory: Positive for cough and shortness of breath. Negative for chest tightness.   Cardiovascular: Positive for leg swelling. Negative for chest pain.  Gastrointestinal: Positive for abdominal pain, diarrhea, nausea and vomiting. Negative for abdominal distention and blood in stool.  Genitourinary: Negative for difficulty urinating.  Skin: Negative for color change, pallor and wound.  Neurological: Negative for syncope, light-headedness, numbness and headaches.     Patient Active Problem List   Diagnosis Date Noted  . No-show for appointment 05/12/2020  . Cervical spinal stenosis 10/09/2019  . Status post carotid endarterectomy 07/30/2019  . Subclavian artery stenosis, left ([place] 07/30/2019  . Migraines 07/16/2019  . Vitamin D deficiency 05/19/2019  . Hyperglycemia due to diabetes mellitus ([place] 11/17/2018  . S/P coronary artery stent placement   . Hyperlipidemia LDL goal <70   . Hepatic steatosis   . Mild renal insufficiency 07/21/2018  . Bipolar depression ([place] 07/21/2018  . Incidental lung nodule 07/21/2018  . Prolonged  QT interval 07/21/2018  . Hyperlipidemia 05/14/2016  . Hypothyroidism 05/14/2016  . Essential hypertension 05/14/2016    Past Medical History: Past Medical History:  Diagnosis Date   . Acute systolic heart failure ([place])   . Anxiety   . Asthma   . Bipolar depression ([place])   . Carpal tunnel syndrome, bilateral 09/07/2019  . Chronic bronchitis ([place])   . Chronic esophagogastric ulcer   . Chronic stomach ulcer   . Diabetic ketoacidosis without coma associated with type 2 diabetes mellitus ([place]) 05/12/2019  . Diarrhea 12/25/2019  . Dysequilibrium 04/03/2020  . Elevated transaminase level 07/21/2018  . Encounter for screening colonoscopy 04/03/2020  . Encounter for screening mammogram for malignant neoplasm of breast 04/03/2020  . Esophagitis determined by endoscopy   . Extravasation accident, initial encounter 07/21/2018  . Fibroid   . GERD (gastroesophageal reflux disease)   . Heart murmur   . Hepatic steatosis   . History of blood transfusion 2010   "related to subclavian stent"  . History of hiatal hernia   . Hyperlipidemia   . Hypertension   . Hypothyroid   . LFTs abnormal   . Migraine    "cerebral migraines; 1-2/month" (06/29/2018)  . Nausea 08/09/2018  . Need for immunization against influenza 04/03/2020  . Non-ST elevation (NSTEMI) myocardial infarction ([place]) 10/27/2018  . 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 SOB (shortness of breath)   . 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 Stenosis of left subclavian artery ([place]) 06/05/2019   Left subclavian artery stenosis. left upper extremity claudication and subclavian steal symptoms. Carotid Dopplers performed 04/17/2019 with suggestion of occluded left subclavian artery with retrograde vertebral filling and subsequent CT angiogram performed 04/27/2019 with confirmation -Admitted for elective PV angiogram in which recommendations were for a left common carotid to subclavian bypass.  . Stroke ([place])    "I've had 2; most recent one was ~ 2015 or before; affected balance" (06/29/2018)  . 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 Thalassemia minor   . Type II diabetes mellitus ([place])   . Vitamin D deficiency     Past  Surgical History: Past Surgical History:  Procedure Laterality Date  . ABDOMINAL HYSTERECTOMY  2015   "still have 1 ovary"  . AORTIC ARCH ANGIOGRAPHY  06/14/2019  . AORTIC ARCH ANGIOGRAPHY N/A 06/14/2019   Procedure: AORTIC ARCH ANGIOGRAPHY;  Surgeon: Lorretta Harp, MD;  Location: [place] CV LAB;  Service: Cardiovascular;  Laterality: N/A;  . BIOPSY  07/25/2018   Procedure: BIOPSY;  Surgeon: Thornton Park, MD;  [facility];  Service: Gastroenterology;;  . CAROTID-SUBCLAVIAN BYPASS GRAFT Left 07/30/2019   Procedure: LEFT CAROTID-SUBCLAVIAN BYPASS GRAFT;  Surgeon: Marty Heck, MD;  Location: [place];  Service: Vascular;  Laterality: Left;  . CESAREAN SECTION  1988; 1996  . CORONARY STENT INTERVENTION N/A 10/27/2018   Procedure: CORONARY STENT INTERVENTION;  Surgeon: Sherren Mocha, MD;  Location: [place] CV LAB;  Service: Cardiovascular;  Laterality: N/A;  . ESOPHAGOGASTRODUODENOSCOPY (EGD) WITH PROPOFOL N/A 07/25/2018   Procedure: ESOPHAGOGASTRODUODENOSCOPY (EGD) WITH PROPOFOL;  Surgeon: Thornton Park, MD;  Location: [place];  Service: Gastroenterology;  Laterality: N/A;  . KNEE RECONSTRUCTION Left 1980  . LAPAROSCOPIC CHOLECYSTECTOMY    . LEFT HEART CATH AND CORONARY ANGIOGRAPHY N/A 10/27/2018   Procedure: LEFT HEART CATH AND CORONARY ANGIOGRAPHY;  Surgeon: Sherren Mocha, MD;  Location: [place] CV LAB;  Service: Cardiovascular;  Laterality: N/A;  . SUBCLAVIAN STENT PLACEMENT Left 2010  . TONSILLECTOMY  [place] Right 06/14/2019   Procedure: UPPER EXTREMITY ANGIOGRAPHY;  Surgeon: Lorretta Harp, MD;  Location: [place] CV LAB;  Service: Cardiovascular;  Laterality: Right;  upper ext    Social History: Social History   Tobacco Use  . Smoking status: Former Smoker    Packs/day: 2.00    Years: 36.00    Pack years: 72.00    Types: Cigarettes    Quit date: 05/14/2014    Years since quitting: 6.1  .  Smokeless tobacco: Never Used  Vaping Use  . Vaping Use: Former  Substance Use Topics  . Alcohol use: Not Currently  . Drug use: Not Currently   Additional social history:  Please also refer to relevant sections of EMR.  Family History: Family History  Problem Relation Age of Onset  . Diabetes Mother   . Hypertension Mother   . Hyperlipidemia Mother   . CAD Mother   . Cervical cancer Mother   . CAD Father   . Heart failure Father   . Testicular cancer Father   . Hypertension Sister   . Hyperlipidemia Sister   . Hypertension Brother   . Hyperlipidemia Brother      Allergies and Medications: Allergies  Allergen Reactions  . Ilosone [Erythromycin] Anaphylaxis  . Keflex [Cephalexin] Anaphylaxis  . Penicillins Anaphylaxis    Has patient had a PCN reaction causing immediate rash, facial/tongue/throat swelling, SOB or lightheadedness with hypotension: YES Has patient had a PCN reaction causing severe rash involving mucus membranes or skin necrosis: NO Has patient had a PCN reaction that required hospitalizationNO Has patient had a PCN reaction occurring within the last 10 years: NO If all of the above answers are "NO", then may proceed with Cephalosporin use.  . Iron Other (See Comments)    Pt reports condition limiting Iron intake.  . Metformin And Related Other (See Comments)    UNSPECIFIED REACTION  Pt prefers to not take this medication Side effects  . Morphine And Related Other (See Comments)    Pt reports hallucinations   No current facility-administered medications on file prior to encounter.   Current Outpatient Medications on File Prior to Encounter  Medication Sig Dispense Refill  . Accu-Chek FastClix Lancets MISC TEST 3 TIMES DAILY 306 each 3  . ADVAIR HFA 115-21 MCG/ACT inhaler USE 2 INHALATIONS BY MOUTH  TWICE DAILY 24 g 5  . albuterol (VENTOLIN HFA) 108 (90 Base) MCG/ACT inhaler USE 2 TO 4 INHALATIONS BY  MOUTH EVERY 4 HOURS AS  NEEDED MANUFACTURER   RECOMMENDS NOT EXCEEDING 12 INH/DAY 25.5 g 12  . aspirin 81 MG tablet Take 1 tablet (81 mg total) by mouth daily. 30 tablet 1  . atorvastatin (LIPITOR) 80 MG tablet Take 1 tablet (80 mg total) by mouth daily. 30 tablet 0  . Blood Glucose Monitoring Suppl (CONTOUR NEXT ONE) KIT 1 kit by Does not apply route as directed. 1 kit 0  . Cholecalciferol (VITAMIN D3) 25 MCG (1000 UT) CAPS Take 1,000-2,000 Units by mouth See admin instructions. Take 2 tablets by mouth once a day on Mon/Wed/Fri and take 1 tablet on Sun/Tues/Thurs/Sat    . Continuous Blood Gluc Sensor (DEXCOM G6 SENSOR) MISC Inject 1 applicator into the skin as directed. Change sensor every 10 days. 3 each 11  . Continuous Blood Gluc Transmit (DEXCOM G6 TRANSMITTER) MISC Inject 1 Device into the skin as directed. Reuse 8 times with sensor changes. 1 each 3  . diphenoxylate-atropine (LOMOTIL) 2.5-0.025 MG  tablet Take 1 tablet by mouth 4 (four) times daily as needed for diarrhea or loose stools. (Patient not taking: Reported on 05/08/2020) 40 tablet 0  . empagliflozin (JARDIANCE) 25 MG TABS tablet Take 1 tablet (25 mg total) by mouth daily. (Patient not taking: Reported on 05/08/2020) 90 tablet 3  . EPINEPHrine 0.3 mg/0.3 mL IJ SOAJ injection Inject 0.3 mg into the muscle once as needed for anaphylaxis (for an anaphylactic reaction). (Patient not taking: Reported on 05/08/2020) 1 each 1  . furosemide (LASIX) 20 MG tablet Take 1 tablet (20 mg total) by mouth 2 (two) times daily. 30 tablet 5  . Glucagon, rDNA, (GLUCAGON EMERGENCY) 1 MG KIT Inject 1 mg into the vein once as needed (FOR ONSET OF HYPOGLYCEMIA). (Patient not taking: Reported on 04/24/2020) 1 kit 2  . insulin glargine (LANTUS SOLOSTAR) 100 UNIT/ML Solostar Pen Inject 60 Units into the skin every morning AND 60 Units at bedtime. 30 mL 11  . insulin lispro (HUMALOG KWIKPEN) 100 UNIT/ML KwikPen Inject 40 Units into the skin 4 (four) times daily. 15 mL 5  . Insulin Pen Needle (PEN NEEDLES) 32G X 4  MM MISC 1 Device by Does not apply route 5 (five) times daily. 150 each 11  . lamoTRIgine (LAMICTAL) 200 MG tablet Take 1 tablet (200 mg total) by mouth daily. 30 tablet 6  . levothyroxine (SYNTHROID) 150 MCG tablet Take 1 tablet (150 mcg total) by mouth at bedtime. 3:30 Am 30 tablet 0  . losartan (COZAAR) 100 MG tablet Take 1 tablet (100 mg total) by mouth daily. 30 tablet 0  . Lurasidone HCl (LATUDA) 60 MG TABS Take 1 tablet (60 mg total) by mouth at bedtime. 30 tablet 6  . metoCLOPramide (REGLAN) 5 MG tablet Take 1 tablet (5 mg total) by mouth every 6 (six) hours as needed for nausea or vomiting. (Patient not taking: Reported on 04/24/2020) 40 tablet 0  . metoprolol tartrate (LOPRESSOR) 50 MG tablet Take 1 tablet (50 mg total) by mouth 2 (two) times daily. 60 tablet 0  . Multiple Vitamin (MULTIVITAMIN WITH MINERALS) TABS tablet Take 1 tablet by mouth daily. Woman 50 +    . nitroGLYCERIN (NITROSTAT) 0.4 MG SL tablet Place 1 tablet (0.4 mg total) under the tongue every 5 (five) minutes x 3 doses as needed for chest pain. (Patient not taking: Reported on 05/08/2020) 30 tablet 12  . nystatin (MYCOSTATIN/NYSTOP) powder Apply 1 application topically daily as needed (infection). (Patient not taking: Reported on 05/08/2020) 30 g 3  . nystatin ointment (MYCOSTATIN) Apply 1 application topically daily as needed (infection). (Patient not taking: Reported on 05/08/2020) 30 g 3  . pantoprazole (PROTONIX) 40 MG tablet Take 1 tablet (40 mg total) by mouth 2 (two) times daily before a meal. X 8 weeks (Patient taking differently: Take 40 mg by mouth 2 (two) times daily. ) 60 tablet 3  . potassium chloride SA (KLOR-CON) 20 MEQ tablet Take 1 tablet (20 mEq total) by mouth daily. 90 tablet 3  . SUMAtriptan (IMITREX) 25 MG tablet  TAKE 1 TABLET BY MOUTH EVERY 2 HOURS AS NEEDED FOR MIGRAINE. MAY REPEAT IN 2 HOURS IF HEADACHE PERSISTS OR RECURS. PLEASE TAKE AS SOON AS YOU FEEL YOUR MIGRAINE STARTING.  (Patient not taking:  Reported on 04/24/2020) 10 tablet 0  . topiramate (TOPAMAX) 25 MG tablet Take 1 tablet (25 mg total) by mouth at bedtime. 30 tablet 3  . topiramate (TOPAMAX) 50 MG tablet Take 1 tablet (50 mg total) by  mouth at bedtime. 30 tablet 3  . traZODone (DESYREL) 50 MG tablet Take 50 mg by mouth at bedtime. (Patient not taking: Reported on 05/08/2020)    . venlafaxine XR (EFFEXOR-XR) 75 MG 24 hr capsule Take 75 mg by mouth daily. (Patient not taking: Reported on 05/08/2020)      Objective: BP (!) 147/94 (BP Location: Left Arm)   Pulse 93   Temp 98.4 F (36.9 C)   Resp 16   Ht _0  (1.6 m)   Wt 101.2 kg   LMP 08/03/2013   SpO2 96%   BMI 39.52 kg/m    Exam: General: Alert and oriented, no apparent distress  Eyes: PEERLA ENTM: No pharyengeal erythema, mucus membranes dry Neck: nontender Cardiovascular: RRR with no murmurs noted, distal pulses present Respiratory: CTA bilaterally, diminish breath sounds bilaterally Gastrointestinal: Bowel sounds present. No abdominal pain MSK: Upper extremity strength 5/5 bilaterally, Lower extremity strength 5/5 bilaterally  Derm: No rashes noted Psych: Behavior and speech appropriate to situation  Labs and Imaging: CBC BMET  Recent Labs  Lab 07/07/20 1634  WBC 12.2*  HGB 13.6  HCT 44.2  PLT 341   Recent Labs  Lab 07/07/20 1634  NA 134*  K 3.9  CL 99  CO2 23  BUN 13  CREATININE 0.75  GLUCOSE 518*  CALCIUM 9.2     EKG: My own interpretation SR without ST elevation  DG Chest Portable 1 View  Result Date: 07/08/2020 CLINICAL DATA:  Cough. EXAM: PORTABLE CHEST 1 VIEW COMPARISON:  November 24, 2019. FINDINGS: Elevated left hemidiaphragm. Left basilar atelectasis versus consolidation. No visible pleural effusions or pneumothorax. Similar cardiomediastinal contour. Left lower neck clips. No acute osseous abnormality. IMPRESSION: Similar elevated left hemidiaphragm with left basilar atelectasis versus consolidation. Electronically Signed   By:  Margaretha Sheffield MD   On: 07/08/2020 10:24    Carollee Leitz, MD 07/08/2020, 11:04 AM PGY-2, [place] Intern pager: [phone], text pages welcome

## 2020-07-09 ENCOUNTER — Telehealth: Payer: Self-pay

## 2020-07-09 LAB — CBC
HCT: 41.6 % (ref 36.0–46.0)
Hemoglobin: 12.3 g/dL (ref 12.0–15.0)
MCH: 19.4 pg — ABNORMAL LOW (ref 26.0–34.0)
MCHC: 29.6 g/dL — ABNORMAL LOW (ref 30.0–36.0)
MCV: 65.5 fL — ABNORMAL LOW (ref 80.0–100.0)
Platelets: 278 10*3/uL (ref 150–400)
RBC: 6.35 MIL/uL — ABNORMAL HIGH (ref 3.87–5.11)
RDW: 19 % — ABNORMAL HIGH (ref 11.5–15.5)
WBC: 12.4 10*3/uL — ABNORMAL HIGH (ref 4.0–10.5)
nRBC: 0 % (ref 0.0–0.2)

## 2020-07-09 LAB — BASIC METABOLIC PANEL
Anion gap: 11 (ref 5–15)
BUN: 10 mg/dL (ref 6–20)
CO2: 23 mmol/L (ref 22–32)
Calcium: 8.8 mg/dL — ABNORMAL LOW (ref 8.9–10.3)
Chloride: 102 mmol/L (ref 98–111)
Creatinine, Ser: 0.64 mg/dL (ref 0.44–1.00)
GFR, Estimated: >60 mL/min (ref >60)
Glucose, Bld: 337 mg/dL — ABNORMAL HIGH (ref 70–99)
Potassium: 3.4 mmol/L — ABNORMAL LOW (ref 3.5–5.1)
Sodium: 136 mmol/L (ref 135–145)

## 2020-07-09 LAB — HEPATIC FUNCTION PANEL
ALT: 22 U/L (ref 0–44)
AST: 20 U/L (ref 15–41)
Albumin: 2.9 g/dL — ABNORMAL LOW (ref 3.5–5.0)
Alkaline Phosphatase: 84 U/L (ref 38–126)
Bilirubin, Direct: <0.1 mg/dL (ref 0.0–0.2)
Total Bilirubin: 0.6 mg/dL (ref 0.3–1.2)
Total Protein: 5.6 g/dL — ABNORMAL LOW (ref 6.5–8.1)

## 2020-07-09 LAB — GLUCOSE, CAPILLARY
Glucose-Capillary: 342 mg/dL — ABNORMAL HIGH (ref 70–99)
Glucose-Capillary: 401 mg/dL — ABNORMAL HIGH (ref 70–99)

## 2020-07-09 MED ORDER — ACCU-CHEK GUIDE W/DEVICE KIT: PACK | 0 refills | Status: DC

## 2020-07-09 MED ORDER — K PHOS MONO-SOD PHOS DI & MONO 155-852-130 MG PO TABS
250.0000 mg | ORAL_TABLET | Freq: Once | ORAL | Status: AC
  Administered 2020-07-09: 250 mg via ORAL
  Filled 2020-07-09: qty 1

## 2020-07-09 MED ORDER — POTASSIUM CHLORIDE CRYS ER 20 MEQ PO TBCR: 20.0000 meq | EXTENDED_RELEASE_TABLET | Freq: Every day | ORAL | 3 refills | Status: AC

## 2020-07-09 MED ORDER — ACCU-CHEK GUIDE VI STRP: ORAL_STRIP | 12 refills | Status: DC

## 2020-07-09 MED ORDER — CONTOUR NEXT TEST VI STRP: ORAL_STRIP | 3 refills | Status: DC

## 2020-07-09 MED ORDER — ACCU-CHEK SOFTCLIX LANCETS MISC: 12 refills | Status: DC

## 2020-07-09 NOTE — Evaluation (Signed)
Physical Therapy Evaluation Patient Details Name: JEANEEN CALA MRN: [id number] DOB: 01-Dec-1965 Today's Date: 07/09/2020   History of Present Illness  Pt is 54 year old with history of poorly controlled type 2 diabetes, coronary does artery disease status post stent, hypothyroidism, subclavian steal with stent placement presenting with nausea and mild abdominal pain in the setting of hyperglycemia without ketoacidosis.  Clinical Impression  Pt admitted with above diagnosis.  She has been transferring and ambulating independently in room.  Pt demonstrated safe gait and dynamic gait activities without LOB.  She was able to perform stairs with min guard for safety.  Pt is functioning near her baseline and demonstrates mobility necessary to return home safely from PT perspective.     Follow Up Recommendations No PT follow up    Equipment Recommendations  None recommended by PT    Recommendations for Other Services       Precautions / Restrictions Precautions Precautions: None Restrictions Weight Bearing Restrictions: No      Mobility  Bed Mobility Overal bed mobility: Independent                  Transfers Overall transfer level: Modified independent Equipment used: None             General transfer comment: increased time  Ambulation/Gait Ambulation/Gait assistance: Independent;Supervision Gait Distance (Feet): 200 Feet Assistive device: None Gait Pattern/deviations: WFL(Within Functional Limits) Gait velocity: decreased   General Gait Details: Pt had supervision for hall way ambulation during therapy but has been ambulating independently in room.  She demonstrated safe gait pattern without LOB  Stairs Stairs: Yes Stairs assistance: Min guard Stair Management: One rail Right;Step to pattern;Forwards Number of Stairs: 3 General stair comments: performed slowly but steady  Wheelchair Mobility    Modified Rankin (Stroke Patients Only)       Balance  Overall balance assessment: Needs assistance   Sitting balance-Leahy Scale: Normal     Standing balance support: No upper extremity supported Standing balance-Leahy Scale: Good                 High Level Balance Comments: Pt able to perform stairs, navigate in tight areas, reach outside BOS, and look up/down/L/R with gait             Pertinent Vitals/Pain Pain Assessment: No/denies pain Faces Pain Scale: Hurts a little bit Pain Location: "the usual aches" Pain Descriptors / Indicators: Aching Pain Intervention(s): Monitored during session    Home Living Family/patient expects to be discharged to:: Private residence Living Arrangements: Alone Available Help at Discharge: Family;Available PRN/intermittently Type of Home: House Home Access: Stairs to enter Entrance Stairs-Rails: None Entrance Stairs-Number of Steps: 3 Home Layout: One level Home Equipment: Cane - single point;Walker - 2 wheels;Bedside commode;Shower seat;Adaptive equipment Additional Comments: uses assistive devices intermittently    Prior Function Level of Independence: Independent         Comments: pt has cane,walker but reports not needing to use them the past few monthes. Utilizes reacher to faciliate picking items up off floor.     Hand Dominance   Dominant Hand: Right    Extremity/Trunk Assessment   Upper Extremity Assessment Upper Extremity Assessment: Overall WFL for tasks assessed    Lower Extremity Assessment Lower Extremity Assessment: Overall WFL for tasks assessed    Cervical / Trunk Assessment Cervical / Trunk Assessment: Normal  Communication   Communication: No difficulties  Cognition Arousal/Alertness: Awake/alert Behavior During Therapy: Flat affect;WFL for tasks assessed/performed Overall Cognitive Status: Within  Functional Limits for tasks assessed                                        General Comments      Exercises     Assessment/Plan     PT Assessment Patent does not need any further PT services  PT Problem List         PT Treatment Interventions      PT Goals (Current goals can be found in the Care Plan section)  Acute Rehab PT Goals Patient Stated Goal: return home PT Goal Formulation: All assessment and education complete, DC therapy    Frequency     Barriers to discharge        Co-evaluation               AM-PAC PT "6 Clicks" Mobility  Outcome Measure Help needed turning from your back to your side while in a flat bed without using bedrails?: None Help needed moving from lying on your back to sitting on the side of a flat bed without using bedrails?: None Help needed moving to and from a bed to a chair (including a wheelchair)?: None Help needed standing up from a chair using your arms (e.g., wheelchair or bedside chair)?: None Help needed to walk in hospital room?: None Help needed climbing 3-5 steps with a railing? : A Little 6 Click Score: 23    End of Session   Activity Tolerance: Patient tolerated treatment well Patient left: in bed;with call bell/phone within reach Nurse Communication: Mobility status      Time: 1150-1203 PT Time Calculation (min) (ACUTE ONLY): 13 min   Charges:   PT Evaluation $PT Eval Low Complexity: 1 Low          Chrystel Barefield, PT Acute Rehab Services Pager [phone] [facility] [phone]    Karlton Lemon 07/09/2020, 12:09 PM

## 2020-07-09 NOTE — Progress Notes (Signed)
Inpatient Diabetes Program Recommendations  AACE/ADA: New Consensus Statement on Inpatient Glycemic Control (2015)  Target Ranges:  Prepandial:   less than 140 mg/dL      Peak postprandial:   less than 180 mg/dL (1-2 hours)      Critically ill patients:  140 - 180 mg/dL  Results for Morgan [name], Morgan [name] (MRN [id number]) as of 07/09/2020 10:44  Ref. Range 07/08/2020 03:58 07/08/2020 11:04 07/08/2020 17:30 07/08/2020 20:03 07/09/2020 08:21  Glucose-Capillary Latest Ref Range: 70 - 99 mg/dL 433 (H) 377 (H) 331 (H) 253 (H) 342 (H)   Results for Morgan [name], Morgan [name] (MRN [id number]) as of 07/09/2020 10:44  Ref. Range 11/24/2019 22:28 07/07/2020 15:20  Hemoglobin A1C Latest Ref Range: 4.0 - 5.6 % 14.5 (H) 11.9 (A)    Review of Glycemic Control  Diabetes history: DM2 Outpatient Diabetes medications: Lantus 60 units BID, Humalog 50 units 3-4 times a day, Jardiance 25 mg daily (never filled or taken) Current orders for Inpatient glycemic control: Lantus 30 units daily, Novolog 0-15 units TID with meals, Novolog 0-5 units QHS  Inpatient Diabetes Program Recommendations:    HbgA1C:  A1C 11.9% on 07/07/20 indicating an average glucose of 295 mg/dl over the past 2-3 months.  NOTE: Spoke with patient over the phone about diabetes and home regimen for diabetes control. Patient reports being followed by PCP for diabetes management and currently prescribed Lantus 60 units BID and Humalog 50 units 3-4 times a day with meals.  Inquired about Jardiance listed on home med list and patient states that Vania Rea was prescribed but she lost her insurance and never got it filled. Patient states that the provider today told her that they will wait on the Jardiance and address it at her next follow up appointment to determine if she needs to start taking it.  Patient states that she recently got a new job and she has new insurance (none currently listed in the chart).  Patient admits that she has not been taking insulin  consistently and she "fell off the ban wagon".  Patient is not checking glucose lately because she ran out of test strips. Patient states she will need a prescription for test strips at discharge and requested it be sent to her pharmacy since they deliver her medications and supplies needed as she does not drive. Discussed A1C results (11.9% on 07/07/20) and explained that current A1C indicates an average glucose of 295 mg/dl over the past 2-3 months. Prior A1C in chart was 14.5% on 11/24/19. Discussed glucose and A1C goals. Discussed importance of checking CBGs and maintaining good CBG control to prevent long-term and short-term complications. Encouraged patient to get back on track with DM management by taking DM medications as prescribed, checking glucose 3-4 times per day and following up consistently with PCP.  Asked patient to be sure to take glucometer or glucose log with her to appointments so provider can use the information to continue to make adjustments with DM medications if needed. Patient states she is happy she is going home today and wants to get home to her cats.  Patient verbalized understanding of information discussed and reports no further questions at this time related to diabetes.  Thanks, Barnie Alderman, RN, MSN, CDE Diabetes Coordinator Inpatient Diabetes Program [phone] (Team Pager)

## 2020-07-09 NOTE — Discharge Summary (Signed)
[facility] Discharge Summary  Patient name: Morgan [name] Medical record number: [id number] Date of birth: [birth year] Age: 54 y.o. Gender: female Date of Admission: 07/07/2020  Date of Discharge: 07/09/2020 Admitting Physician: Carollee Leitz, MD  Primary Care Provider: [name], DO Consultants: None  Indication for Hospitalization: Nausea and abdominal pain with hyperglycemia   Discharge Diagnoses/Problem List:  Hyperglycemia without ketoacidosis HTN Hyperthyroidism Bipolar disorder Leukocytosis Consolidation on CXR  Disposition: Home  Discharge Condition: Stable, improved  Discharge Exam:  Gen: NAD, sitting up in bed CV: RRR, no m/r/g appreciated, distal pulses present, left ankle with 1+ non-pitting edema compared to the right Pulm: CTAB, normal WOB Abd: patient refused palpation, bowel sounds present Skin: hyperpigmentation noted in bilateral extremities  Brief Hospital Course:  Morgan [name] is a 54 y.o. female that presented with hyperglycemia and leukocytosis. PMH significant for T2DM, Thalassemia minor, subclavian steal syndrome, CVA, h/o NSTEMI, bipolar disorder, hypothyroidism, HTN.  Hyperglycemia without ketoacidosis Patient was admitted with glucose of 518, patient was non-adherent to outpatient medications due to social situations. Patient was given moderate sliding scale insulin and Lantus 30U, which is half of her home dose. HbA1c was 11.9, recommend outpatient diabetic education and medication management.  Leukocytosis Patient admitted with WBC of 13.2. Likely related to hyperglycemia, though unable to rule out if related to consolidation on CXR (see below). On discharge, WBC was 12.4, recommend outpatient follow-up.  Consolidation on CXR CXR notable for elevation of the left diaphragm and left basilar airspace disease, atelectasis vs pneumonia, which is similar to CXR 6 months prior. Patient did not have symptoms  concerning for pneumonia and antibiotics were not initiated. Recommend repeat CXR in 4-6 weeks.   HTN BP was mildly elevated during hospitalization with several systolic readings >638. Home medications were continued.   Hypothyroidism Patient's TSH was within normal limits, patient continued on her home levothyroxine.    Discharge Follow Up: 1. Patient has developing atelectasis versus pneumonia in left lower lobe on chest x-ray from 12/7.  She will need a repeat chest x-ray in 4-6 weeks.  Monitor her for symptoms and have close follow-up with PCP. 2. Patient is unable to afford Jardiance and her CBGs were not well controlled when admitted.  Please assess CBGs at f/u and change regimen of insulin as needed. 3. Recommend BP follow-up, patient had 2 readings with systolic in the 453'M and medication adjustment as appropriate.  1.   Significant Procedures: None  Significant Labs and Imaging:  Recent Labs  Lab 07/07/20 1634 07/08/20 1932 07/09/20 0810  WBC 12.2* 13.2* 12.4*  HGB 13.6 12.9 12.3  HCT 44.2 43.4 41.6  PLT 341 283 278   Recent Labs  Lab 07/07/20 1634 07/07/20 1634 07/08/20 1932 07/09/20 0325 07/09/20 0810  NA 134*  --  135  --  136  K 3.9   < > 4.3  --  3.4*  CL 99  --  100  --  102  CO2 23  --  24  --  23  GLUCOSE 518*  --  298*  --  337*  BUN 13  --  9  --  10  CREATININE 0.75  --  0.60  --  0.64  CALCIUM 9.2  --  8.6*  --  8.8*  PHOS  --   --  2.3*  --   --   ALKPHOS  --   --   --  84  --   AST  --   --   --  20  --   ALT  --   --   --  22  --   ALBUMIN  --   --  3.2* 2.9*  --    < > = values in this interval not displayed.    DG Chest 2 View  Result Date: 07/08/2020 CLINICAL DATA:  Follow-up abnormal chest x-ray EXAM: CHEST - 2 VIEW COMPARISON:  07/08/2020, 11/24/2019, 10/27/2018 FINDINGS: Mild elevation of left diaphragm as before. Airspace disease at the left lung base without significant change, atelectasis versus pneumonia. The right lung is clear.  Stable cardiomediastinal silhouette. Clips over the left lower neck. Vascular stent cranial to the aortic knob. IMPRESSION: No significant interval change in elevation of left diaphragm with left basilar airspace disease, atelectasis versus pneumonia. Electronically Signed   By: Kim  Fujinaga M.D.   On: 07/08/2020 19:36   DG Chest Portable 1 View  Result Date: 07/08/2020 CLINICAL DATA:  Cough. EXAM: PORTABLE CHEST 1 VIEW COMPARISON:  November 24, 2019. FINDINGS: Elevated left hemidiaphragm. Left basilar atelectasis versus consolidation. No visible pleural effusions or pneumothorax. Similar cardiomediastinal contour. Left lower neck clips. No acute osseous abnormality. IMPRESSION: Similar elevated left hemidiaphragm with left basilar atelectasis versus consolidation. Electronically Signed   By: Frederick S Jones MD   On: 07/08/2020 10:24    Results/Tests Pending at Time of Discharge: None  Discharge Medications:  Allergies as of 07/09/2020      Reactions   Ilosone [erythromycin] Anaphylaxis   Keflex [cephalexin] Anaphylaxis   Penicillins Anaphylaxis   Has patient had a PCN reaction causing immediate rash, facial/tongue/throat swelling, SOB or lightheadedness with hypotension: YES Has patient had a PCN reaction causing severe rash involving mucus membranes or skin necrosis: NO Has patient had a PCN reaction that required hospitalizationNO Has patient had a PCN reaction occurring within the last 10 years: NO If all of the above answers are "NO", then may proceed with Cephalosporin use.   Iron Other (See Comments)   Pt reports condition limiting Iron intake.   Metformin And Related Other (See Comments)   UNSPECIFIED REACTION  Pt prefers to not take this medication Side effects   Morphine And Related Other (See Comments)   Pt reports hallucinations      Medication List    STOP taking these medications   diphenoxylate-atropine 2.5-0.025 MG tablet Commonly known as: LOMOTIL   metoCLOPramide  5 MG tablet Commonly known as: Reglan     TAKE these medications   Accu-Chek FastClix Lancets Misc TEST 3 TIMES DAILY   Advair HFA 115-21 MCG/ACT inhaler Generic drug: fluticasone-salmeterol USE 2 INHALATIONS BY MOUTH  TWICE DAILY What changed: See the new instructions.   albuterol 108 (90 Base) MCG/ACT inhaler Commonly known as: VENTOLIN HFA USE 2 TO 4 INHALATIONS BY  MOUTH EVERY 4 HOURS AS  NEEDED MANUFACTURER  RECOMMENDS NOT EXCEEDING 12 INH/DAY What changed: See the new instructions.   aspirin 81 MG tablet Take 1 tablet (81 mg total) by mouth daily.   atorvastatin 80 MG tablet Commonly known as: LIPITOR Take 1 tablet (80 mg total) by mouth daily.   Contour Next One Kit 1 kit by Does not apply route as directed.   Contour Next Test test strip Generic drug: glucose blood Please use to check blood sugar up to four times daily. Dx code: E11.65 What changed: additional instructions   Dexcom G6 Sensor Misc Inject 1 applicator into the skin as directed. Change sensor every 10 days.   Dexcom G6 Transmitter Misc Inject   1 Device into the skin as directed. Reuse 8 times with sensor changes.   empagliflozin 25 MG Tabs tablet Commonly known as: Jardiance Take 1 tablet (25 mg total) by mouth daily.   EPINEPHrine 0.3 mg/0.3 mL Soaj injection Commonly known as: EPI-PEN Inject 0.3 mg into the muscle once as needed for anaphylaxis (for an anaphylactic reaction).   furosemide 20 MG tablet Commonly known as: LASIX Take 1 tablet (20 mg total) by mouth 2 (two) times daily.   Glucagon Emergency 1 MG Kit Inject 1 mg into the vein once as needed (FOR ONSET OF HYPOGLYCEMIA).   insulin lispro 100 UNIT/ML KwikPen Commonly known as: HumaLOG KwikPen Inject 40 Units into the skin 4 (four) times daily. What changed: how much to take   lamoTRIgine 200 MG tablet Commonly known as: LAMICTAL Take 1 tablet (200 mg total) by mouth daily.   Lantus SoloStar 100 UNIT/ML Solostar  Pen Generic drug: insulin glargine Inject 60 Units into the skin every morning AND 60 Units at bedtime. What changed:   how much to take  how to take this  when to take this  additional instructions   Latuda 60 MG Tabs Generic drug: Lurasidone HCl Take 1 tablet (60 mg total) by mouth at bedtime.   levothyroxine 150 MCG tablet Commonly known as: SYNTHROID Take 1 tablet (150 mcg total) by mouth at bedtime. 3:30 Am What changed: when to take this   losartan 100 MG tablet Commonly known as: COZAAR Take 1 tablet (100 mg total) by mouth daily.   metoprolol tartrate 50 MG tablet Commonly known as: LOPRESSOR Take 1 tablet (50 mg total) by mouth 2 (two) times daily.   multivitamin with minerals Tabs tablet Take 1 tablet by mouth daily. Woman 50 +   nitroGLYCERIN 0.4 MG SL tablet Commonly known as: NITROSTAT Place 1 tablet (0.4 mg total) under the tongue every 5 (five) minutes x 3 doses as needed for chest pain.   nystatin ointment Commonly known as: MYCOSTATIN Apply 1 application topically daily as needed (infection).   nystatin powder Commonly known as: MYCOSTATIN/NYSTOP Apply 1 application topically daily as needed (infection).   pantoprazole 40 MG tablet Commonly known as: PROTONIX Take 1 tablet (40 mg total) by mouth 2 (two) times daily before a meal. X 8 weeks What changed:   when to take this  additional instructions   Pen Needles 32G X 4 MM Misc 1 Device by Does not apply route 5 (five) times daily.   potassium chloride SA 20 MEQ tablet Commonly known as: KLOR-CON Take 1 tablet (20 mEq total) by mouth daily.   SUMAtriptan 25 MG tablet Commonly known as: IMITREX TAKE 1 TABLET BY MOUTH EVERY 2 HOURS AS NEEDED FOR MIGRAINE. MAY REPEAT IN 2 HOURS IF HEADACHE PERSISTS OR RECURS. PLEASE TAKE AS SOON AS YOU FEEL YOUR MIGRAINE STARTING. What changed: See the new instructions.   topiramate 25 MG tablet Commonly known as: Topamax Take 1 tablet (25 mg total) by  mouth at bedtime. What changed: additional instructions   topiramate 50 MG tablet Commonly known as: Topamax Take 1 tablet (50 mg total) by mouth at bedtime. What changed: additional instructions   traZODone 50 MG tablet Commonly known as: DESYREL Take 50 mg by mouth at bedtime.   venlafaxine XR 150 MG 24 hr capsule Commonly known as: EFFEXOR-XR Take 150 mg by mouth daily.   Vitamin D3 25 MCG (1000 UT) Caps Take 1,000-2,000 Units by mouth See admin instructions. Take 2 tablets by   mouth once a day on Mon/Wed/Fri and take 1 tablet on Sun/Tues/Thurs/Sat       Discharge Instructions: Please refer to Patient Instructions section of EMR for full details.  Patient was counseled important signs and symptoms that should prompt return to medical care, changes in medications, dietary instructions, activity restrictions, and follow up appointments.   Follow-Up Appointments:  Follow-up Information    [facility]. Go on 07/11/2020.   Why: at 3:10pm.  Please arrive 15 minutes prior to your appointment time. Contact information: [address] [place] [address] [phone]              , , DO 07/09/2020, 12:11 PM PGY-1, [facility]  

## 2020-07-09 NOTE — Telephone Encounter (Signed)
Pharmacy calls nurse line reporting her insurance will only pay for accu chek guide supplies. I have resent accu chek guide supplies to her pharmacy with diagnoses codes and time testing.

## 2020-07-09 NOTE — Evaluation (Signed)
Occupational Therapy Evaluation Patient Details Name: Morgan [name] MRN: [id number] DOB: 1966/03/03 Today's Date: 07/09/2020    History of Present Illness 54 year old with history of poorly controlled type 2 diabetes, coronary does artery disease status post stent, hypothyroidism, subclavian steal with stent placement presenting with nausea and mild abdominal pain in the setting of hyperglycemia without ketoacidosis.    Clinical Impression   Pt admitted with the above diagnoses. At baseline pt is independent to mod I with basic ADLs, utilizes 3n1 over toilet and reacher at home. Pt presents at/close to baseline with ADLs. Provided education on energy conservation strategies to facilitate basic ADLs and instrumental ADLs (meal prep, housekeeping, etc) at home. Also discussed some fall prevention strategies. No further OT needs indicated at this time. OT will sign off.    Follow Up Recommendations  No OT follow up    Equipment Recommendations  None recommended by OT    Recommendations for Other Services       Precautions / Restrictions Restrictions Weight Bearing Restrictions: No      Mobility Bed Mobility Overal bed mobility: Independent                  Transfers Overall transfer level: Modified independent Equipment used: None             General transfer comment: extra time, mildly wide BOS    Balance Overall balance assessment: Mild deficits observed, not formally tested                                         ADL either performed or assessed with clinical judgement   ADL Overall ADL's : Modified independent                                       General ADL Comments: Mod I for extra time, occasional single extremity support walking, utilizes reacher in lieu of bending/squatting to get items off floor. Pt reports she has taken a shower and tidied up in the room last night and today.     Vision Baseline  Vision/History: Wears glasses       Perception     Praxis      Pertinent Vitals/Pain Pain Assessment: Faces Faces Pain Scale: Hurts a little bit Pain Location: "the usual aches" Pain Descriptors / Indicators: Aching Pain Intervention(s): Monitored during session     Hand Dominance     Extremity/Trunk Assessment Upper Extremity Assessment Upper Extremity Assessment: Overall WFL for tasks assessed   Lower Extremity Assessment Lower Extremity Assessment: Defer to PT evaluation (pt endorses baseline BLE weakness)       Communication Communication Communication: No difficulties   Cognition Arousal/Alertness: Awake/alert Behavior During Therapy: Flat affect;WFL for tasks assessed/performed Overall Cognitive Status: Within Functional Limits for tasks assessed                                     General Comments       Exercises     Shoulder Instructions      Home Living Family/patient expects to be discharged to:: Private residence Living Arrangements: Alone Available Help at Discharge: Family;Available PRN/intermittently Type of Home: House Home Access: Stairs to enter Entrance Stairs-Number of Steps: 3 Entrance  Stairs-Rails: None Home Layout: One level     Bathroom Shower/Tub: Occupational psychologist: Standard     Home Equipment: Cane - single point;Walker - 2 wheels;Bedside commode;Shower seat;Adaptive equipment Adaptive Equipment: Reacher Additional Comments: uses assistive devices intermittently      Prior Functioning/Environment Level of Independence: Independent        Comments: pt has cane,walker but reports not needing to use them the past few monthes. Utilizes reacher to faciliate picking items up off floor.        OT Problem List:        OT Treatment/Interventions:      OT Goals(Current goals can be found in the care plan section)    OT Frequency:     Barriers to D/C:            Co-evaluation               AM-PAC OT "6 Clicks" Daily Activity     Outcome Measure Help from another person eating meals?: None Help from another person taking care of personal grooming?: None Help from another person toileting, which includes using toliet, bedpan, or urinal?: None Help from another person bathing (including washing, rinsing, drying)?: None Help from another person to put on and taking off regular upper body clothing?: None Help from another person to put on and taking off regular lower body clothing?: None 6 Click Score: 24   End of Session    Activity Tolerance: Patient tolerated treatment well Patient left: in bed;with call bell/phone within reach;Other (comment) (sitting EOB)  OT Visit Diagnosis: Unsteadiness on feet (R26.81);Muscle weakness (generalized) (M62.81)                Time: 3291-9166 OT Time Calculation (min): 19 min Charges:  OT General Charges $OT Visit: 1 Visit OT Evaluation $OT Eval Low Complexity: [place], OT Acute Rehabilitation Services Pager: [phone] Office: [phone]   Hortencia Pilar 07/09/2020, 9:33 AM

## 2020-07-09 NOTE — Hospital Course (Addendum)
Morgan [name] is a 54 y.o. female that presented with hyperglycemia and leukocytosis. PMH significant for T2DM, Thalassemia minor, subclavian steal syndrome, CVA, h/o NSTEMI, bipolar disorder, hypothyroidism, HTN.  Hyperglycemia without ketoacidosis Patient was admitted with glucose of 518, patient was non-adherent to outpatient medications due to social situations. Patient was given moderate sliding scale insulin and Lantus 30U, which is half of her home dose. HbA1c was 11.9, recommend outpatient diabetic education and medication management.  Leukocytosis Patient admitted with WBC of 13.2. Likely related to hyperglycemia, though unable to rule out if related to consolidation on CXR (see below). On discharge, WBC was 12.4, recommend outpatient follow-up.  Consolidation on CXR CXR notable for elevation of the left diaphragm and left basilar airspace disease, atelectasis vs pneumonia, which is similar to CXR 6 months prior. Patient did not have symptoms concerning for pneumonia and antibiotics were not initiated. Recommend repeat CXR in 4-6 weeks.   HTN BP was mildly elevated during hospitalization with several systolic readings >638. Home medications were continued.   Hypothyroidism Patient's TSH was within normal limits, patient continued on her home levothyroxine.    Discharge Follow Up: Patient has developing atelectasis versus pneumonia in left lower lobe on chest x-ray from 12/7.  She will need a repeat chest x-ray in 4-6 weeks.  Monitor her for symptoms and have close follow-up with PCP. Patient is unable to afford Jardiance and her CBGs were not well controlled when admitted.  Please assess CBGs at f/u and change regimen of insulin as needed. Recommend BP follow-up, patient had 2 readings with systolic in the 177'N and medication adjustment as appropriate.

## 2020-07-09 NOTE — Discharge Instructions (Signed)
It was great taking care of you while you were in the hospital.  You were admitted to the hospital for hyperglycemia, you were treated with insulin with improvement.  Please follow-up very closely with your PCP, even appointment with Dr. Pilar Plate on 12/10 at 3:10 PM.  You have a there looks like there could possibly be a pneumonia developing in your lungs, we recommend getting a chest x-ray with your PCP in 4-6 weeks.  If you have any worsening of your symptoms, please contact your PCP ASAP and consider going to the ED. Continue to take your home insulin. Please make sure make your appointment on 12/10 at the [facility] at 3:10pm.

## 2020-07-09 NOTE — Progress Notes (Signed)
Pt discharged home via cab to son's house(her home currently.)  All discharged instructions and medications reviewed.  NAD.

## 2020-07-11 ENCOUNTER — Ambulatory Visit: Payer: Medicaid Other | Admitting: Family Medicine

## 2020-07-14 ENCOUNTER — Ambulatory Visit (INDEPENDENT_AMBULATORY_CARE_PROVIDER_SITE_OTHER): Payer: Self-pay | Admitting: Family Medicine

## 2020-07-14 ENCOUNTER — Encounter: Payer: Self-pay | Admitting: Family Medicine

## 2020-07-14 ENCOUNTER — Other Ambulatory Visit: Payer: Self-pay

## 2020-07-14 VITALS — BP 132/80 | HR 82 | Wt 221.0 lb

## 2020-07-14 DIAGNOSIS — I10 Essential (primary) hypertension: Secondary | ICD-10-CM

## 2020-07-14 DIAGNOSIS — E1165 Type 2 diabetes mellitus with hyperglycemia: Secondary | ICD-10-CM

## 2020-07-14 DIAGNOSIS — E119 Type 2 diabetes mellitus without complications: Secondary | ICD-10-CM

## 2020-07-14 DIAGNOSIS — Z794 Long term (current) use of insulin: Secondary | ICD-10-CM

## 2020-07-14 DIAGNOSIS — D72829 Elevated white blood cell count, unspecified: Secondary | ICD-10-CM

## 2020-07-14 MED ORDER — ATORVASTATIN CALCIUM 80 MG PO TABS
80.0000 mg | ORAL_TABLET | Freq: Every day | ORAL | 0 refills | Status: DC
Start: 2020-07-14 — End: 2020-08-19

## 2020-07-14 MED ORDER — LANTUS SOLOSTAR 100 UNIT/ML ~~LOC~~ SOPN: PEN_INJECTOR | SUBCUTANEOUS | 11 refills | Status: DC

## 2020-07-14 MED ORDER — INSULIN LISPRO (1 UNIT DIAL) 100 UNIT/ML (KWIKPEN): 45.0000 [IU] | PEN_INJECTOR | Freq: Four times a day (QID) | SUBCUTANEOUS | 5 refills | Status: DC

## 2020-07-14 MED ORDER — ADVAIR HFA 115-21 MCG/ACT IN AERO
INHALATION_SPRAY | RESPIRATORY_TRACT | 5 refills | Status: DC
Start: 2020-07-14 — End: 2020-10-16

## 2020-07-14 NOTE — Assessment & Plan Note (Signed)
Adequately controlled today 132/80 in office. No changes to medications.

## 2020-07-14 NOTE — Assessment & Plan Note (Signed)
Not well-controlled, fasting CBGs in the low to mid 300s. - increase Lantus to 65 units BID - increase Humalog to 45 units 3-4 times daily with meals - can consider adding empagliflozin in addition, will defer for future follow-up

## 2020-07-14 NOTE — Patient Instructions (Signed)
It was nice seeing you today, Jan.  Increase your insulin to Lantus 65 units twice a day and Humalog 45 units 3-4 times a day with meals. Please the clinic a call if you develop symptoms of low blood sugar. Continue measuring your blood sugars as you are doing.  Your blood pressure was good today, no changes to your medications.  Let's wait until your blood sugars are better controlled before setting up your eye exam.  I will repeat your blood work today.  Please follow-up in 1 week with Dr. Valentina Lucks.  Stay well, Zola Button, MD

## 2020-07-14 NOTE — Progress Notes (Signed)
    SUBJECTIVE:   CHIEF COMPLAINT / HPI: Hospital f/u  Hospital f/u / T2DM with hyperglycemia not in DKA Patient was recently admitted from 12/6-12/8 for hyperglycemia with initial glucose 518, without evidence of DKA.  She was also found to have mild leukocytosis up to 13.2 which persisted throughout admission, thought to be related to hyperglycemia, though she did have consolidation noted on CXR consistent with atelectasis versus pneumonia, similar to CXR 6 months prior.  She was not started on antibiotics as she did not have any symptoms.  On discharge, recommended to titrate diabetes regimen, repeat CBC, and repeat CXR in 4 to 6 weeks.  Her diabetes regimen was adjusted and includes Lantus 60 units twice daily and Humalog 40 units 3-4 times daily with meals. Today, she feels her nausea and abdominal pain have improved. Fasting CBGs 309-360, typically mid 300s CBGs during the day also mid 300s Reports good adherence to insulin Did not start empagliflozin yet, needs PA for it  HTN Takes losartan 100 mg daily, metoprolol tartrate 50 mg BID, furosemide 20 mg BID.  Barriers to care No longer drives, just transitioned jobs and is having issues adjusting to schedule. Has been  Discussed CCM services, patient amenable to services.  PERTINENT  PMH / PSH: T2DM, HTN, HLD, hypothyroidism, bipolar disorder, CAD s/p stent 10/2018, former smoker  OBJECTIVE:   BP 132/80   Pulse 82   Wt 221 lb (100.2 kg)   LMP 08/03/2013   SpO2 97%   BMI 39.15 kg/m   General: Obese woman, NAD CV: RRR, no murmurs Pulm: CTAB, no wheezes or rales  ASSESSMENT/PLAN:   Type 2 diabetes mellitus with hyperglycemia (HCC) Not well-controlled, fasting CBGs in the low to mid 300s. - increase Lantus to 65 units BID - increase Humalog to 45 units 3-4 times daily with meals - can consider adding empagliflozin in addition, will defer for future follow-up  Essential hypertension Adequately controlled today 132/80 in  office. No changes to medications.   Barriers to care No longer driving and just transitioned jobs. CCM has been following, patient amenable to their continued assitance. - reached out to CCM for further follow-up  Leukocytosis Noted on recent admission, thought to be secondary to hyperglycemia. - repeat CBC today  Abnormal CXR Consolidation consistent with atelectasis vs pneumonia on CXR during recent admission. No symptoms. - needs repeat CXR in 4-6 weeks  F/u 1 week with Dr. Quinn Axe, MD [place]

## 2020-07-15 ENCOUNTER — Telehealth: Payer: Self-pay | Admitting: Licensed Clinical Social Worker

## 2020-07-15 LAB — CBC
Hematocrit: 42.2 % (ref 34.0–46.6)
Hemoglobin: 13.2 g/dL (ref 11.1–15.9)
MCH: 19.9 pg — ABNORMAL LOW (ref 26.6–33.0)
MCHC: 31.3 g/dL — ABNORMAL LOW (ref 31.5–35.7)
MCV: 64 fL — ABNORMAL LOW (ref 79–97)
Platelets: 392 10*3/uL (ref 150–450)
RBC: 6.63 x10E6/uL — ABNORMAL HIGH (ref 3.77–5.28)
RDW: 18.4 % — ABNORMAL HIGH (ref 11.7–15.4)
WBC: 14 10*3/uL — ABNORMAL HIGH (ref 3.4–10.8)

## 2020-07-15 NOTE — Chronic Care Management (AMB) (Signed)
    Clinical Social Work  Care Management Outreach   07/15/2020 Name: Morgan [name] MRN: [id number] DOB: 04-17-1966  Morgan [name] is a 54 y.o. year old female who is a primary care patient of [name], DO .  Received referral from Dr. Nancy Fetter to assist patient with Transportation needs.LCSW reached out to [name] today by phone to introduce self, assess needs and offer Care Management services and interventions.    Plan:  Phone appointment scheduled with LCSW 07/16/2020  Review of patient status, including review of consultants reports, relevant laboratory and other test results, and collaboration with appropriate care team members and the patient's provider was performed as part of comprehensive patient evaluation and provision of care management services.    [name], [place] / [place]   [phone] 1:42 PM

## 2020-07-15 NOTE — Telephone Encounter (Signed)
Spoke with patient who stated the PA was approved for her dexcom supplies, however the copay for each piece is still unaffordable. Patient is fine going back to test strips and meter.

## 2020-07-16 ENCOUNTER — Ambulatory Visit: Payer: PRIVATE HEALTH INSURANCE | Admitting: Licensed Clinical Social Worker

## 2020-07-16 NOTE — Patient Instructions (Signed)
  Ms. Windle  it was nice speaking with you. Please call me directly [phone] if you have questions about the goals we discussed. Goals Addressed            This Visit's Progress   . Find Help in My Community   On track    Timeframe:  Short-Term Goal Priority:  High Start Date:07/16/20                      Expected End Date: 07/16/20                       Patient Goals/Self-Care Activities: Over the next 365 days . You are registered to use [employer]  . Call them when you need transportation to any [facility] related appointments . [phone]   Why is this important?   . Knowing how and where to find help for yourself or family in your neighborhood and community is an important skill.     Patient Care Plan: Social Work  Problem Identified: Barriers to Treatment   Goal: Patient will obtain assistance with transportation to all medical appointments Completed 07/16/2020  Start Date: 07/16/2020  Expected End Date: 07/16/2020  This Visit's Progress: On track  Priority: High  Assessment, progress & current barriers:  Patient does not have transportation to medical appointment  .  acknowledges deficits with meeting this unmet need Clinical Interventions:  . Assessment of needs, barriers, as well as how impacting  . Review various resources and discussed options   . Provided patient with information about [employer]  . Called [facility] with patient on the line to get her registered . Other interventions provided:Solution-Focused Strategies and Problem Solving Patient Goals/Self-Care Activities: Over the next 365 days . You are registered to use [employer]  . Call them when you need transportation to any [facility] related appointments . [phone] Follow Up Plan: Next PCP appointment scheduled for:  Jan. 3rd . LCSW will check in with patient 60 to 90 days . Patient will call if she has needs    Ms. Hover received Care Management services today:   1. Care Management services include personalized support from designated clinical staff supervised by her physician, including individualized plan of care and coordination with other care providers 2. 24/7 contact [phone] for assistance for urgent and routine care needs. 3. Care Management are voluntary services and be declined at any time by calling the office.  Patient verbalizes understanding of instructions provided today.    Follow up plan: SW will follow up with patient by phone over the next 60 to 90 days  Maurine Cane, LCSW

## 2020-07-16 NOTE — Chronic Care Management (AMB) (Signed)
Care Management  Follow Up Clinical Social Work General Note  07/16/2020 Name: Morgan [name] MRN: [id number] DOB: 1966/02/16  Morgan [name] is enrolled in a Managed Medicaid plan: No. Outreach attempt today was successful.   SELENI [name] is a 54 y.o. year old female who is a primary care patient of Morgan Floro, DO. The Care Management team was consulted to assist the patient with Transportation Needs  See care plan below for details. Patient started a new job working remotely from home. Reports over all she is doing well.    Follow Up Plan: Next PCP appointment scheduled for:  Jan. 3rd . LCSW will check in with patient 60 to 90 days . Patient will call if she has needs  SDOH (Social Determinants of Health) screening performed today:  Yes.    SDOH Interventions   Flowsheet Row Most Recent Value  SDOH Interventions   SDOH Interventions for the Following Domains Transportation  Transportation Interventions [facility]   Advanced Directives Status:Not addressed in this encounter.       Patient Care Plan: Social Work  Problem Identified: Barriers to Treatment / Transportation    Goal: Patient will obtain assistance with transportation to all medical appointments Completed 07/16/2020  Start Date: 07/16/2020  Expected End Date: 07/16/2020  This Visit's Progress: On track  Priority: High  Assessment, progress & current barriers:  Patient does not have transportation to medical appointment  .  acknowledges deficits with meeting this unmet need Clinical Interventions:  . Assessment of needs, barriers, as well as how impacting  . Review various resources and discussed options   . Provided patient with information about [employer]  . Called [facility] with patient on the line to get her registered . Other interventions provided:Solution-Focused Strategies and Problem Solving Patient Goals/Self-Care Activities: Over the next 365 days . You are  registered to use [employer]  . Call them when you need transportation to any [facility] related appointments . [phone]    Outpatient Encounter Medications as of 07/16/2020  Medication Sig Note  . Accu-Chek Softclix Lancets lancets Use to test blood sugar 4x daily. E11.95.   [name] albuterol (VENTOLIN HFA) 108 (90 Base) MCG/ACT inhaler USE 2 TO 4 INHALATIONS BY  MOUTH EVERY 4 HOURS AS  NEEDED MANUFACTURER  RECOMMENDS NOT EXCEEDING 12 INH/DAY (Patient taking differently: 2-4 puffs every 4 (four) hours as needed for wheezing or shortness of breath.)   . aspirin 81 MG tablet Take 1 tablet (81 mg total) by mouth daily.   [name] atorvastatin (LIPITOR) 80 MG tablet Take 1 tablet (80 mg total) by mouth daily.   . Blood Glucose Monitoring Suppl (ACCU-CHEK GUIDE) w/Device KIT Use to test blood sugars 4x daily. E11.95.   [name] Cholecalciferol (VITAMIN D3) 25 MCG (1000 UT) CAPS Take 1,000-2,000 Units by mouth See admin instructions. Take 2 tablets by mouth once a day on Mon/Wed/Fri and take 1 tablet on Sun/Tues/Thurs/Sat   . empagliflozin (JARDIANCE) 25 MG TABS tablet Take 1 tablet (25 mg total) by mouth daily. (Patient not taking: No sig reported) 05/08/2020: Haven't picked up due to cost  . EPINEPHrine 0.3 mg/0.3 mL IJ SOAJ injection Inject 0.3 mg into the muscle once as needed for anaphylaxis (for an anaphylactic reaction).   . fluticasone-salmeterol (ADVAIR HFA) 115-21 MCG/ACT inhaler USE 2 INHALATIONS BY MOUTH&nbsp;&nbsp;TWICE DAILY   . furosemide (LASIX) 20 MG tablet Take 1 tablet (20 mg total) by mouth 2 (two) times daily.   . Glucagon, rDNA, (  GLUCAGON EMERGENCY) 1 MG KIT Inject 1 mg into the vein once as needed (FOR ONSET OF HYPOGLYCEMIA).   [name] glucose blood (ACCU-CHEK GUIDE) test strip Use to check blood sugars 4x daily. E11.95.   . insulin glargine (LANTUS SOLOSTAR) 100 UNIT/ML Solostar Pen Inject 65 Units into the skin every morning AND 65 Units at bedtime.   . insulin lispro (HUMALOG KWIKPEN) 100  UNIT/ML KwikPen Inject 45 Units into the skin 4 (four) times daily. With meals   . Insulin Pen Needle (PEN NEEDLES) 32G X 4 MM MISC 1 Device by Does not apply route 5 (five) times daily.   [name] lamoTRIgine (LAMICTAL) 200 MG tablet Take 1 tablet (200 mg total) by mouth daily.   [name] levothyroxine (SYNTHROID) 150 MCG tablet Take 1 tablet (150 mcg total) by mouth at bedtime. 3:30 Am (Patient taking differently: Take 150 mcg by mouth daily before breakfast. 3:30 Am)   . losartan (COZAAR) 100 MG tablet Take 1 tablet (100 mg total) by mouth daily.   . Lurasidone HCl (LATUDA) 60 MG TABS Take 1 tablet (60 mg total) by mouth at bedtime.   . metoprolol tartrate (LOPRESSOR) 50 MG tablet Take 1 tablet (50 mg total) by mouth 2 (two) times daily.   . Multiple Vitamin (MULTIVITAMIN WITH MINERALS) TABS tablet Take 1 tablet by mouth daily. Woman 50 +   . nitroGLYCERIN (NITROSTAT) 0.4 MG SL tablet Place 1 tablet (0.4 mg total) under the tongue every 5 (five) minutes x 3 doses as needed for chest pain.   [name] nystatin (MYCOSTATIN/NYSTOP) powder Apply 1 application topically daily as needed (infection).   . nystatin ointment (MYCOSTATIN) Apply 1 application topically daily as needed (infection).   . pantoprazole (PROTONIX) 40 MG tablet Take 1 tablet (40 mg total) by mouth 2 (two) times daily before a meal. X 8 weeks (Patient taking differently: Take 40 mg by mouth 2 (two) times daily.) 07/08/2020: .  . potassium chloride SA (KLOR-CON) 20 MEQ tablet Take 1 tablet (20 mEq total) by mouth daily.   . SUMAtriptan (IMITREX) 25 MG tablet  TAKE 1 TABLET BY MOUTH EVERY 2 HOURS AS NEEDED FOR MIGRAINE. MAY REPEAT IN 2 HOURS IF HEADACHE PERSISTS OR RECURS. PLEASE TAKE AS SOON AS YOU FEEL YOUR MIGRAINE STARTING.  (Patient taking differently: Take 25 mg by mouth every 2 (two) hours as needed for migraine or headache. May repeat in 2 hours.)   . topiramate (TOPAMAX) 25 MG tablet Take 1 tablet (25 mg total) by mouth at bedtime. (Patient taking  differently: Take 25 mg by mouth at bedtime. Take with 28m tablet for a total of 797m)   . topiramate (TOPAMAX) 50 MG tablet Take 1 tablet (50 mg total) by mouth at bedtime. (Patient taking differently: Take 50 mg by mouth at bedtime. Take with 253mablet for a total of 44m99m  . traZODone (DESYREL) 50 MG tablet Take 50 mg by mouth at bedtime.    . veMarland Kitchenlafaxine XR (EFFEXOR-XR) 150 MG 24 hr capsule Take 150 mg by mouth daily.    No facility-administered encounter medications on file as of 07/16/2020.   Ms. TallPackham given information about Care Management services today including:  1. Care Management services include personalized support from designated clinical staff supervised by her physician, including individualized plan of care and coordination with other care providers 2. 24/7 contact phone numbers for assistance for urgent and routine care needs. 3. The patient may stop care management services at any time (effective at the  end of the month) by phone call to the office staff. Patient agreed to services and verbal consent obtained.   Maurine Cane, LCSW

## 2020-07-21 ENCOUNTER — Ambulatory Visit: Payer: Medicaid Other

## 2020-08-03 NOTE — Progress Notes (Signed)
Appt cancelled due to inclement weather. Rescheduled.    Peggyann Shoals, DO [facility], PGY-3 08/05/2020 9:02 AM

## 2020-08-04 ENCOUNTER — Ambulatory Visit (INDEPENDENT_AMBULATORY_CARE_PROVIDER_SITE_OTHER): Payer: PRIVATE HEALTH INSURANCE | Admitting: Family Medicine

## 2020-08-04 DIAGNOSIS — Z794 Long term (current) use of insulin: Secondary | ICD-10-CM

## 2020-08-04 DIAGNOSIS — E1165 Type 2 diabetes mellitus with hyperglycemia: Secondary | ICD-10-CM

## 2020-08-10 NOTE — Progress Notes (Signed)
    SUBJECTIVE:   CHIEF COMPLAINT / HPI:   Bipolar depression: Patient has a history of bipolar depression for which she has been taking [place].  She reports compliance with this medication and good control of her bipolar depression while on this medication.  Currently prescribed 60 mg every evening at bedtime.  She is currently due for new prescription of this medication, reports she has unfortunately been out of this medication for the past couple of weeks due to need for prior authorization.  Denies any SI, HI.  PHQ-9 score today 0.  Left shoulder pain: Patient reports a several week history of left shoulder pain.  She believes it has to do with her history of left subclavian steal.  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 Carlis Abbott (vascular surgeon).  Patient reports pain and difficulty with the left shoulder status post the operation, reports that it is recently become worse over the last several weeks.  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  Locates pain at the anterior-lateral distal aspect of left deltoid.  Reports that the pain has started to wake her up at night and cause her discomfort.  Has any falls, traumas, accidents.  Upon chart review do not see any physical therapy performed after patient's operation in December 2020.   PERTINENT  PMH / PSH:  Hypertension, hyperlipidemia, bipolar depression, uncontrolled type 2 diabetes, cervical spinal stenosis, CAD s/p NSTEMI 10/27/2018 with drug-eluting stents   OBJECTIVE:   BP 110/60   Pulse 95   Ht 5\' 3"  (1.6 m)   LMP 08/03/2013   SpO2 100%   BMI 39.15 kg/m    Physical exam: General: Nontoxic-appearing Respiratory: Comfortable work of breathing, speaking complete sentences Left shoulder: Inspection reveals no obvious deformity, atrophy, or asymmetry. No bruising. No swelling Palpation mild TTP to anterior deltoid, none over [facility] joint or bicipital groove. ROM:  Reduced in flexion, abduction, and external rotation, normal and internal rotation  NV: intact distally all of her extremities Special Tests:  - Supraspinatous: Pain with empty can.  4/5 strength with resisted flexion at 20 degrees - Infraspinatous/Teres Minor: 4/5 strength with ER, pain appreciated - Subscapularis: 4/5 strength with IR - Painful arc appreciated with range of motion however no drop arm sign  ASSESSMENT/PLAN:   Bipolar depression ([place]) Patient with a history of bipolar depression for which she has been doing very well on Latuda 60 mg every evening. -Continue Latuda 60 mg every evening  Chronic left shoulder pain Patient with left shoulder pain since her shoulder surgery December 2020.  Has slowly gotten worse here over the last few weeks and started to ache at night.  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  Bilateral upper extremities neurovascularly intact, no indication at this time for re-stenosis/occlusion of bypass graft. -Patient given range of motion exercises to be performed at home to improve range of motion -Tylenol for pain, warm compresses  Type 2 diabetes mellitus with hyperglycemia ([place]) Patient was seen and evaluated during this appointment by the pharmacy team for her poorly controlled diabetes. -Please see their note and recommendations   Plan to follow-up 4-6 weeks or sooner as needed.  [place]

## 2020-08-11 ENCOUNTER — Encounter: Payer: Self-pay | Admitting: Family Medicine

## 2020-08-11 ENCOUNTER — Ambulatory Visit (INDEPENDENT_AMBULATORY_CARE_PROVIDER_SITE_OTHER): Payer: PRIVATE HEALTH INSURANCE | Admitting: Family Medicine

## 2020-08-11 ENCOUNTER — Ambulatory Visit (INDEPENDENT_AMBULATORY_CARE_PROVIDER_SITE_OTHER): Payer: PRIVATE HEALTH INSURANCE | Admitting: Pharmacist

## 2020-08-11 ENCOUNTER — Other Ambulatory Visit: Payer: Self-pay

## 2020-08-11 VITALS — BP 110/60 | HR 95 | Ht 63.0 in

## 2020-08-11 DIAGNOSIS — E1165 Type 2 diabetes mellitus with hyperglycemia: Secondary | ICD-10-CM

## 2020-08-11 DIAGNOSIS — F319 Bipolar disorder, unspecified: Secondary | ICD-10-CM

## 2020-08-11 DIAGNOSIS — G8929 Other chronic pain: Secondary | ICD-10-CM | POA: Diagnosis not present

## 2020-08-11 DIAGNOSIS — I10 Essential (primary) hypertension: Secondary | ICD-10-CM | POA: Diagnosis not present

## 2020-08-11 DIAGNOSIS — Z794 Long term (current) use of insulin: Secondary | ICD-10-CM | POA: Diagnosis not present

## 2020-08-11 DIAGNOSIS — M25512 Pain in left shoulder: Secondary | ICD-10-CM

## 2020-08-11 LAB — GLUCOSE, POCT (MANUAL RESULT ENTRY): POC Glucose: 468 mg/dl — AB (ref 70–99)

## 2020-08-11 NOTE — Assessment & Plan Note (Signed)
Diabetes longstanding (over 25 years) currently poorly controlled. Medication adherence appears to be poor due to patient self-reported memory loss. Control is suboptimal due adherence and poor diet. - Discussed CGM initiation with patient, but declined due to cost issues. Libre cheaper than Dexcom for patient but she is still unable to afford at this time. -Extensively discussed pathophysiology of diabetes, recommended lifestyle interventions, dietary effects on blood sugar control (specifically outlined several potential strategies for each meal, and emphasized the importance of eating 3 meals per day).  -Counseled on s/sx of and management of hypoglycemia.  -Next A1C anticipated in 3 months.  -Patient underwent POC glucose test today, results of which were 468 mg/dL. Patient is currently asymptomatic and discussed to giving herself her rapid acting insulin (Humalog) immediately when she gets home. -Discussed with patient adherence strategies such as setting alarms or keeping an insulin log diary as her son is unable to help her with her medications. -Will send in Contour Next test strips to encourage checking blood glucose 4x/day as prescribed

## 2020-08-11 NOTE — Progress Notes (Addendum)
S:     Chief Complaint  Patient presents with   Medication Management    CGM - diabetes    Patient arrives in fair spirits.  Presents for diabetes evaluation, education, and management. Patient was referred by Dr. Nancy [name] on 07/14/2021, and last seen by PCP Dr. Ouida [name] on 04/03/2020.  Patient reports Diabetes was diagnosed over 25 years ago.   Family/Social History:  - Tobacco: former smoker (quit 2015)  Insurance coverage/medication affordability: [employer] commercial  Medication adherence reported less than optimal.    Current diabetes medications include: - Lantus 65 units AM and 65 units PM (does not remember degree of adherence) - Humalog 45 units QID w/ meals (does not remember degree of adherence)  Previously tried diabetes medications include: Ozempic (severe N/V leading to DKA), Victoza (intolerance), metformin (intolerance).  Current hypertension medications include: losartan (Cozaar) 100 mg daily, metoprolol tartrate (Lopressor) 50 mg twice dailiy  Current hyperlipidemia medications include: atorvastatin (Lipitor) 80 mg daily  Patient reports polyuria. Denies any new polydipsia or polyphagia.  Patient denies hypoglycemic events.  Patient reported dietary habits: Eats 1-3 meals/day Breakfast: biscuits with gravy or butter, bacon, eggs; varies and cannot recall typical breakfast when initially asked Lunch: 1-3 spoonfuls of peanut butter, if anything  Dinner: [employer], if anything Snacks:Twinkies (in the middle of the night upon awakening), Belvita cookies  Drinks:orange juice, coffee (with sugar and whole milk)  Patient-reported exercise habits: largely sedentary.    O:  Physical Exam Constitutional:      Appearance: Normal appearance. She is obese.  Neurological:     Mental Status: She is alert.  Psychiatric:        Mood and Affect: Mood normal.        Behavior: Behavior normal.        Thought Content: Thought content normal.      Review of  Systems  Psychiatric/Behavioral: Positive for memory loss.   Vitals:   08/11/20 1620  BP: 124/78  Weight: 217 lb 3.2 oz (98.5 kg)  BMI (Calculated): 38.48    Lab Results  Component Value Date   HGBA1C 11.9 (A) 07/07/2020   There were no vitals filed for this visit.  Lipid Panel     Component Value Date/Time   CHOL 124 04/03/2020 1351   TRIG 279 (H) 04/03/2020 1351   HDL 30 (L) 04/03/2020 1351   CHOLHDL 4.1 04/03/2020 1351   CHOLHDL 4.2 06/15/2019 0251   VLDL 49 (H) 06/15/2019 0251   LDLCALC 50 04/03/2020 1351   LDLDIRECT 144.0 09/25/2018 1505    Home fasting blood sugars: "HI" 2 hour post-meal/random blood sugars: "HI" Some days she does not check her blood glucose at all knowing it will be high as well as not liking her current meter (Accu-chek).   Clinical Atherosclerotic Cardiovascular Disease (ASCVD): YES  PHQ-9: patient denied completing form  A/P: Diabetes longstanding (over 25 years) currently poorly controlled. Medication adherence appears to be poor due to patient self-reported memory loss. Control is suboptimal due adherence and poor diet. - Discussed CGM initiation with patient, but declined due to cost issues. Libre cheaper than Dexcom for patient but she is still unable to afford at this time. -Extensively discussed pathophysiology of diabetes, recommended lifestyle interventions, dietary effects on blood sugar control (specifically outlined several potential strategies for each meal, and emphasized the importance of eating 3 meals per day).  -Counseled on s/sx of and management of hypoglycemia.  -Next A1C anticipated in 3 months.  -Patient underwent POC  glucose test today, results of which were 468 mg/dL. Patient is currently asymptomatic and discussed to giving herself her rapid acting insulin (Humalog) immediately when she gets home. -Discussed with patient adherence strategies such as setting alarms or keeping an insulin log diary as her son is unable to  help her with her medications. -Will send in Contour Next test strips to encourage checking blood glucose 4x/day as prescribed  Hypertension longstanding currently 124/78 mmHg, though this reading is likely due to dehydration given the fact that she has not taken her antihypertensive medications in over 1.5 weeks d/t being out of medication.  Blood pressure goal < 130/80 mmHg. Medication adherence not optimal and instructed patient to call clinic sooner if she needs additional refills of medications.  Written patient instructions provided.  Total time in face to face counseling 40 minutes.   Pharmacy team will be in touch intermittently to assess/assist with diabetes management. Clinic Visit with Dr. Milus [name] on 09/08/2020.   Patient seen with Morgan [name], PharmD Candidate, and Morgan [name], PharmD.   BMET results reviewed Glucose: 478 is similar to the POC reading of 468 while in office.   Sodium and Chloride - slightly lower than bottom of range. All other in normal range.   Lab results lead to No changes to tx plan

## 2020-08-11 NOTE — Assessment & Plan Note (Signed)
Hypertension longstanding currently 124/78 mmHg, though this reading is likely due to dehydration given the fact that she has not taken her antihypertensive medications in over 1.5 weeks d/t being out of medication.  Blood pressure goal < 130/80 mmHg. Medication adherence not optimal and instructed patient to call clinic sooner if she needs additional refills of medications.

## 2020-08-11 NOTE — Patient Instructions (Addendum)
It was nice seeing you today Morgan [name]!  Today we talked about diabetes management and focused on insulin adherence and your diet.  Consider making some of the dietary changes we discussed to help cut down on your carb and sugar intake.  In addition, utilize a method to help you remember if you took your insulin. This could include using alarms set at the time you would like to take your morning and evening doses of Lantus and writing down each time you take your insulin and the amount you took.  We did not make any changes to your medications in our visit today.  The pharmacy team will call you tomorrow morning before you work at [employer].

## 2020-08-11 NOTE — Patient Instructions (Addendum)
Thank you for coming in to see [place] today! Please see below to review our plan for today's visit:  1. I have the info now to fill out forms for you - I will get these done ASAP 2. 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 It is important to work out your shoulder and keep it moving. You can apply a warm compress to the shoulder to help it feel better. Do you exercises twice daily.   Please call the clinic at [phone] if your symptoms worsen or you have any concerns. It was our pleasure to serve you!   Dr. Milus Banister [facility]    Shoulder Range of Motion Exercises Shoulder range of motion (ROM) exercises are done to keep the shoulder moving freely or to increase movement. They are often recommended for people who have shoulder pain or stiffness or who are recovering from a shoulder surgery. Phase 1 exercises When you are able, do this exercise 1-2 times per day for 30-60 seconds in each direction, or as directed by your health care provider. Pendulum exercise To do this exercise while sitting: 1. Sit in a chair or at the edge of your bed with your feet flat on the floor. 2. Let your affected arm hang down in front of you over the edge of the bed or chair. 3. Relax your shoulder, arm, and hand. [place] your body so your arm gently swings in small circles. You can also use your unaffected arm to start the motion. 5. Repeat changing the direction of the circles, swinging your arm left and right, and swinging your arm forward and back. To do this exercise while standing: 1. Stand next to a sturdy chair or table, and hold on to it with your hand on your unaffected side. 2. Bend forward at the waist. 3. Bend your knees slightly. 4. Relax your shoulder, arm, and hand. 5. While keeping your shoulder relaxed, use body motion to swing your arm in small circles. 6. Repeat changing the direction of the circles, swinging your arm left and right, and swinging your arm  forward and back. 7. Between exercises, stand up tall and take a short break to relax your lower back.   Phase 2 exercises Do these exercises 1-2 times per day or as told by your health care provider. Hold each stretch for 30 seconds, and repeat 3 times. Do the exercises with one or both arms as instructed by your health care provider. For these exercises, sit at a table with your hand and arm supported by the table. A chair that slides easily or has wheels can be helpful. External rotation 1. Turn your chair so that your affected side is nearest to the table. 2. Place your forearm on the table to your side. Bend your elbow about 90 at the elbow (right angle) and place your hand palm facing down on the table. Your elbow should be about 6 inches away from your side. 3. Keeping your arm on the table, lean your body forward. Abduction 1. Turn your chair so that your affected side is nearest to the table. 2. Place your forearm and hand on the table so that your thumb points toward the ceiling and your arm is straight out to your side. 3. Slide your hand out to the side and away from you, using your unaffected arm to do the work. 4. To increase the stretch, you can slide your chair away from the table. Flexion: forward stretch  1. Sit facing the table. Place your hand and elbow on the table in front of you. 2. Slide your hand forward and away from you, using your unaffected arm to do the work. 3. To increase the stretch, you can slide your chair backward. Phase 3 exercises Do these exercises 1-2 times per day or as told by your health care provider. Hold each stretch for 30 seconds, and repeat 3 times. Do the exercises with one or both arms as instructed by your health care provider. Cross-body stretch: posterior capsule stretch 1. Lift your arm straight out in front of you. 2. Bend your arm 90 at the elbow (right angle) so your forearm moves across your body. 3. Use your other arm to gently pull  the elbow across your body, toward your other shoulder. Wall climbs 1. Stand with your affected arm extended out to the side with your hand resting on a door frame. 2. Slide your hand slowly up the door frame. 3. To increase the stretch, step through the door frame. Keep your body upright and do not lean. Wand exercises You will need a cane, a piece of PVC pipe, or a sturdy wooden dowel for wand exercises. Flexion To do this exercise while standing: 1. Hold the wand with both of your hands, palms down. 2. Using the other arm to help, lift your arms up and over your head, if able. 3. Push upward with your other arm to gently increase the stretch. To do this exercise while lying down: 1. Lie on your back with your elbows resting on the floor and the wand in both your hands. Your hands will be palm down, or pointing toward your feet. 2. Lift your hands toward the ceiling, using your unaffected arm to help if needed. 3. Bring your arms overhead as able, using your unaffected arm to help if needed. Internal rotation 1. Stand while holding the wand behind you with both hands. Your unaffected arm should be extended above your head with the arm of the affected side extended behind you at the level of your waist. The wand should be pointing straight up and down as you hold it. 2. Slowly pull the wand up behind your back by straightening the elbow of your unaffected arm and bending the elbow of your affected arm. External rotation 1. Lie on your back with your affected upper arm supported on a small pillow or rolled towel. When you first do this exercise, keep your upper arm close to your body. Over time, bring your arm up to a 90 angle out to the side. 2. Hold the wand across your stomach and with both hands palm up. Your elbow on your affected side should be bent at a 90 angle. 3. Use your unaffected side to help push your forearm away from you and toward the floor. Keep your elbow on your affected  side bent at a 90 angle. Contact a health care provider if you have:  New or increasing pain.  New numbness, tingling, weakness, or discoloration in your arm or hand. This information is not intended to replace advice given to you by your health care provider. Make sure you discuss any questions you have with your health care provider. Document Revised: 08/31/2017 Document Reviewed: 08/31/2017 Elsevier Patient Education  2021 [employer].

## 2020-08-12 LAB — BASIC METABOLIC PANEL
BUN/Creatinine Ratio: 16 (ref 9–23)
BUN: 13 mg/dL (ref 6–24)
CO2: 24 mmol/L (ref 20–29)
Calcium: 9.8 mg/dL (ref 8.7–10.2)
Chloride: 94 mmol/L — ABNORMAL LOW (ref 96–106)
Creatinine, Ser: 0.81 mg/dL (ref 0.57–1.00)
GFR calc Af Amer: 94 mL/min/{1.73_m2} (ref >59)
GFR calc non Af Amer: 81 mL/min/{1.73_m2} (ref >59)
Glucose: 478 mg/dL — ABNORMAL HIGH (ref 65–99)
Potassium: 3.8 mmol/L (ref 3.5–5.2)
Sodium: 133 mmol/L — ABNORMAL LOW (ref 134–144)

## 2020-08-12 NOTE — Progress Notes (Signed)
Reviewed: I agree the documentation and management of Dr. Valentina Lucks.

## 2020-08-15 ENCOUNTER — Telehealth: Payer: Self-pay

## 2020-08-15 NOTE — Telephone Encounter (Signed)
Patient LVM on nurse line requesting to speak with someone. I called her back x2, however no answer or option for VM.

## 2020-08-17 DIAGNOSIS — G8929 Other chronic pain: Secondary | ICD-10-CM | POA: Insufficient documentation

## 2020-08-17 NOTE — Assessment & Plan Note (Signed)
Patient with a history of bipolar depression for which she has been doing very well on Latuda 60 mg every evening. -Continue Latuda 60 mg every evening

## 2020-08-17 NOTE — Assessment & Plan Note (Signed)
Patient with left shoulder pain since her shoulder surgery December 2020.  Has slowly gotten worse here over the last few weeks and started to ache at night.  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  Bilateral upper extremities neurovascularly intact, no indication at this time for re-stenosis/occlusion of bypass graft. -Patient given range of motion exercises to be performed at home to improve range of motion -Tylenol for pain, warm compresses

## 2020-08-17 NOTE — Assessment & Plan Note (Signed)
Patient was seen and evaluated during this appointment by the pharmacy team for her poorly controlled diabetes. -Please see their note and recommendations

## 2020-08-18 ENCOUNTER — Telehealth: Payer: Self-pay | Admitting: Pharmacist

## 2020-08-18 DIAGNOSIS — I10 Essential (primary) hypertension: Secondary | ICD-10-CM

## 2020-08-18 DIAGNOSIS — E039 Hypothyroidism, unspecified: Secondary | ICD-10-CM

## 2020-08-18 DIAGNOSIS — E1165 Type 2 diabetes mellitus with hyperglycemia: Secondary | ICD-10-CM

## 2020-08-18 DIAGNOSIS — Z794 Long term (current) use of insulin: Secondary | ICD-10-CM

## 2020-08-18 NOTE — Telephone Encounter (Signed)
Called Ms. Leffel to follow-up regarding her blood sugars and diabetes management.   Ms. Peoples states her blood sugars have been running as high as the 500-600's. She reports missing a few doses of her morning insulin glargine (Lantus) due to over sleeping. She has also been injecting 50 units rather than 65 as she states she could not remember what her dose was. She reports adherence with insulin lispro (Humalog) 45 units with meals.  She states her biggest barrier to taking her medications is time. If she over sleeps she has to log straight in to her computer to begin work and does not think to take her insulin. She mentions "it is not a memory problem but a functional problem."  She reports increased polyuria and polydipsia but denies increased polyphagia. Also reports she has noticed her urine is slightly sweet and fruity smelling. She denies any confusion. When discussing concerns regarding DKA she states she has experienced this before and knows the signs to look out for.  She has no longer been eating twinkies in the middle of the night. She states she realized she was only doing so as it was "a warning sign for depression and an emotional crash." She states she had the emotional crash last week and has since been no longer eating them. She seeks care outside [facility] for this and had a follow-up last week.   Patient had to abruptly end phone call due to having to use the bathroom.  -Instructed patient to increase insulin glargine (Lantus) to 55 units in the morning and 55 unit at bedtime -If over sleeps discussed taking morning insulin glargine (Lantus) dose during 5 minute break while computer connects to VPN when starting shift -Will follow-up with patient later in the week to discuss adherence to insulin therapy and blood sugar readings.

## 2020-08-19 MED ORDER — METOPROLOL TARTRATE 50 MG PO TABS: 50.0000 mg | ORAL_TABLET | Freq: Two times a day (BID) | ORAL | 6 refills | Status: DC

## 2020-08-19 MED ORDER — ATORVASTATIN CALCIUM 80 MG PO TABS: 80.0000 mg | ORAL_TABLET | Freq: Every day | ORAL | 6 refills | Status: DC

## 2020-08-19 MED ORDER — GLUCOSE BLOOD VI STRP: ORAL_STRIP | 12 refills | Status: DC

## 2020-08-19 MED ORDER — LOSARTAN POTASSIUM 100 MG PO TABS: 100.0000 mg | ORAL_TABLET | Freq: Every day | ORAL | 6 refills | Status: DC

## 2020-08-19 MED ORDER — LEVOTHYROXINE SODIUM 150 MCG PO TABS: 150.0000 ug | ORAL_TABLET | Freq: Every day | ORAL | 6 refills | Status: DC

## 2020-08-19 NOTE — Telephone Encounter (Signed)
Reviewed: I agree with Dr. Koval's documentation and management. 

## 2020-08-19 NOTE — Addendum Note (Signed)
Addended by: Leavy Cella on: 08/19/2020 03:49 PM   Modules accepted: Orders

## 2020-08-19 NOTE — Telephone Encounter (Signed)
New prescription request from patient sent to pharmacy.   Clarified new Rxs with phone call to pharmacy.  New REFILL prescriptions sent:    Contour BG test strips Atorvastatin Levothyroxine Metoprolol Losartan  Last TSH controlled 07/2020 Last LDL controlled 04/2020 Last BP controlled

## 2020-08-22 ENCOUNTER — Telehealth: Payer: Self-pay | Admitting: Pharmacist

## 2020-08-22 NOTE — Telephone Encounter (Signed)
Called patient today for follow-up of further insulin titration and diabetes management.  Spoke to patient briefly on phone before she began work at [employer] to discuss her blood glucose readings since our last phone call on 08/18/20. Patient reports she has been injecting 45 units of insulin glargine (Lantus) in the morning and at bedtime and 45 units of insulin lispro (Humalog) with her meals. Patient reports improved adherence with morning dose of Lantus since we last spoke and has not missed any doses.  Despite improved adherence with Lantus, patient still reports elevated blood glucose readings in the high 400's. This is likely due to patient decreasing Lantus dose from 50 to 45 rather than increasing dose from 50 to 55 as we previously discussed. Instructed patient to increase Lantus dose to 50 units in the morning and 50 units at bedtime. When asked to repeat back instructions patient verbalized correct insulin titration.   At end of call patient requested appt on Monday with provider to be seen for rash on cheek and film on tongue. She had telehealth appt last night and provider gave her prescription for thrush but recommended the patient still be seen in person by PCP office. Will discuss further diabetes management with patient when she presents for appt on 08/25/20.

## 2020-08-24 NOTE — Progress Notes (Signed)
    SUBJECTIVE:   CHIEF COMPLAINT / HPI: thrush  Oral thrush: patient reports that she has had white plaques, redness, and pain with eating/drinking for about 4-5 days. She started nystatin swish and swallow on Friday and has not improved over the weekend. She denies fever, cough, rash elsewhere, n/v/d. Her mouth is very sensitive.   DM: Fasting this AM 381. Reports lantus 45U and humalog 45U qid. Also on jardiance 25 mg. Patient has been closely followed by pharmacy team to titrate her insulin, but between dietary indiscretions and "not focusing" on her medications, she has not been able to control her BG yet. Per phone note from 3 days ago, patient was supposed to be on Lantus 50U and increase to 55U, but instead went down to 45U of lantus. She reports that she "has a hard time remembering all of the numbers." We discussed that it is imperative that she continue to go up on lantus and follow instructions exactly as her thrush will not be controlled until her diabetes is controlled.   PERTINENT  PMH / PSH: DMT2  OBJECTIVE:   BP 124/70   Pulse (!) 116   Ht 5\' 3"  (1.6 m)   Wt 209 lb 6.4 oz (95 kg)   LMP 08/03/2013   SpO2 95%   BMI 37.09 kg/m   Nursing note and vitals reviewed GEN: older [place], resting comfortably in chair, NAD, obese HEENT: NCAT. Sclera without injection or icterus. MMM. Beefy red tongue, white plaque on right buccal, oropharynx with white plaque visible inferior to uvula. Neck: Supple. No LAD. Cardiac: Regular rate and rhythm. Normal S1/S2. No murmurs, rubs, or gallops appreciated. 2+ radial pulses. Lungs: Clear bilaterally to ascultation. No increased WOB, no accessory muscle usage. No w/r/r. Neuro: Alert and at baseline Ext: no edema Psych: Pleasant and appropriate    ASSESSMENT/PLAN:   Oral pharyngeal candidiasis Patient failed nystatin swish and swallow treatment after 4 days without improvement for suspected thrush. Patient has uncontrolled DM, which is likely  etiology of candidiasis. DDX also includes Vit B12 deficiency with beefy red tongue, or immunosuppression, or other candidal infection. KOH today positive for yeast. Will send oropharyngeal fungal culture, Vit B12 level. As patient has failed nystatin treatment, best option is to treat systemically with antifungal. Diflucan 150 mg qd x2d best option for treatment of short duration, precepted with Drs. Koval and [place]. Patient has h/o prolonged QT, but ekg with sinus rhythm today and qt WNL. Discussed options and patient will do 2 day treatment of diflucan. Follow up if not improved in 72 hours. Return precautions given.  Type 2 diabetes mellitus with hyperglycemia (HCC) Patient has poorly controlled DM. Educated extensively on importance of proper titration of insulin to get control over DM. Patient was able to tell me dose of lantus before leaving. Will have her do 50U lantus today and tomorrow and then titrate up (increase by 2-5U if fasting BG >200) with pharmacy call on Wednesday. Consider CCM referral for home health RN to assist with medications.     Gladys Damme, MD [place]

## 2020-08-25 ENCOUNTER — Other Ambulatory Visit: Payer: Self-pay

## 2020-08-25 ENCOUNTER — Ambulatory Visit (HOSPITAL_COMMUNITY)
Admission: RE | Admit: 2020-08-25 | Discharge: 2020-08-25 | Disposition: A | Discharge status: Home / Self Care | Payer: PRIVATE HEALTH INSURANCE | Source: Ambulatory Visit | Attending: Family Medicine | Admitting: Family Medicine

## 2020-08-25 ENCOUNTER — Ambulatory Visit (INDEPENDENT_AMBULATORY_CARE_PROVIDER_SITE_OTHER): Payer: PRIVATE HEALTH INSURANCE | Admitting: Family Medicine

## 2020-08-25 VITALS — BP 124/70 | HR 116 | Ht 63.0 in | Wt 209.4 lb

## 2020-08-25 DIAGNOSIS — E1165 Type 2 diabetes mellitus with hyperglycemia: Secondary | ICD-10-CM

## 2020-08-25 DIAGNOSIS — R000 Tachycardia, unspecified: Secondary | ICD-10-CM | POA: Diagnosis present

## 2020-08-25 DIAGNOSIS — K130 Diseases of lips: Secondary | ICD-10-CM

## 2020-08-25 DIAGNOSIS — B370 Candidal stomatitis: Secondary | ICD-10-CM

## 2020-08-25 DIAGNOSIS — Z794 Long term (current) use of insulin: Secondary | ICD-10-CM

## 2020-08-25 LAB — POCT ORAL KOH: KOH Prep POC: POSITIVE — AB

## 2020-08-25 MED ORDER — FLUCONAZOLE 150 MG PO TABS: ORAL_TABLET | ORAL | 0 refills | Status: DC

## 2020-08-25 NOTE — Patient Instructions (Addendum)
It was a pleasure to see you today!  1. For your diabetes: it is important to continue to go up on lantus until your diabetes is under control. Please take Lantus FIFTY units tonight. Then check your fasting blood sugar tomorrow morning, if it is GREATER than 200, increase lantus by 5 units to FIFTY-FIVE UNITS in the morning and at night. Hughes Better, our pharmacist, will call you on Wednesday and help you go up on your lantus. Do not go down on lantus unless your fasting blood sugar is less than 200.  2. For your thrush: we did some lab work, I will let you know results when they return. In the meantime, stop using nystatin and take diflucan one pill today and one pill tomorrow. If you have any problems: funny heart beat, continued thrush, let us know.  3. Return for follow up next Monday, January 31st, at 10:10 AM.  Be Well,  Dr. Chauncey Reading

## 2020-08-26 ENCOUNTER — Encounter: Payer: Self-pay | Admitting: Family Medicine

## 2020-08-26 LAB — VITAMIN B12: Vitamin B-12: 1092 pg/mL (ref 232–1245)

## 2020-08-28 ENCOUNTER — Telehealth: Payer: Self-pay | Admitting: Pharmacist

## 2020-08-28 NOTE — Telephone Encounter (Signed)
Attempted to call patient before she began work at [employer] to assess current blood glucose control and further insulin titration. Patient did not answer and there was no option to leave voicemail.

## 2020-08-29 ENCOUNTER — Telehealth: Payer: Self-pay | Admitting: Pharmacist

## 2020-08-29 NOTE — Telephone Encounter (Signed)
Follow-up phone call attempt from yesterday to contact patient regarding blood glucose readings and diabetes management.   I was able to speak with Ms. Glasscock today and she stated she has increased her insulin glargine (Lantus) to 65 units twice daily and she is still taking 45 units of insulin lispro (Humalog) with meals. She reports missing one PM dose of Lantus this week due to falling asleep before taking it. The following morning her blood glucose was in the 400's, cannot recall exact number. Besides that reading, her fasting blood glucose this week has been consistently in the high 200's.  Instructed patient to increase Lantus from 65 units twice daily to 70 units twice daily. Asked patient to repeat back instructions. Counseled patient on hypoglycemia management. Patient has appt with provider on 09/01/20 and can assess further management at that time.  Also instructed patient to test blood glucose 1-2 hours after meals over next several days in order to help assess appropriateness of Humalog dose. Patient states she will attempt to, but checking post-prandial glucose is a challenge for her.

## 2020-08-31 DIAGNOSIS — B370 Candidal stomatitis: Secondary | ICD-10-CM | POA: Insufficient documentation

## 2020-08-31 NOTE — Assessment & Plan Note (Signed)
Patient has poorly controlled DM. Educated extensively on importance of proper titration of insulin to get control over DM. Patient was able to tell me dose of lantus before leaving. Will have her do 50U lantus today and tomorrow and then titrate up (increase by 2-5U if fasting BG >200) with pharmacy call on Wednesday. Consider CCM referral for home health RN to assist with medications.

## 2020-08-31 NOTE — Assessment & Plan Note (Signed)
Patient failed nystatin swish and swallow treatment after 4 days without improvement for suspected thrush. Patient has uncontrolled DM, which is likely etiology of candidiasis. DDX also includes Vit B12 deficiency with beefy red tongue, or immunosuppression, or other candidal infection. KOH today positive for yeast. Will send oropharyngeal fungal culture, Vit B12 level. As patient has failed nystatin treatment, best option is to treat systemically with antifungal. Diflucan 150 mg qd x2d best option for treatment of short duration, precepted with Drs. Koval and [place]. Patient has h/o prolonged QT, but ekg with sinus rhythm today and qt WNL. Discussed options and patient will do 2 day treatment of diflucan. Follow up if not improved in 72 hours. Return precautions given.

## 2020-09-01 ENCOUNTER — Other Ambulatory Visit: Payer: Self-pay

## 2020-09-01 ENCOUNTER — Encounter: Payer: Self-pay | Admitting: Family Medicine

## 2020-09-01 ENCOUNTER — Ambulatory Visit (INDEPENDENT_AMBULATORY_CARE_PROVIDER_SITE_OTHER): Payer: PRIVATE HEALTH INSURANCE | Admitting: Family Medicine

## 2020-09-01 VITALS — BP 132/80 | HR 93 | Ht 63.0 in | Wt 216.4 lb

## 2020-09-01 DIAGNOSIS — E1165 Type 2 diabetes mellitus with hyperglycemia: Secondary | ICD-10-CM | POA: Diagnosis not present

## 2020-09-01 DIAGNOSIS — Z794 Long term (current) use of insulin: Secondary | ICD-10-CM

## 2020-09-01 LAB — GLUCOSE, POCT (MANUAL RESULT ENTRY): POC Glucose: 358 mg/dl — AB (ref 70–99)

## 2020-09-01 MED ORDER — EMPAGLIFLOZIN 25 MG PO TABS: 25.0000 mg | ORAL_TABLET | Freq: Every day | ORAL | 3 refills | Status: AC

## 2020-09-01 MED ORDER — TRUE METRIX BLOOD GLUCOSE TEST VI STRP: ORAL_STRIP | 12 refills | Status: AC

## 2020-09-01 MED ORDER — PEN NEEDLES 32G X 4 MM MISC: 1.0000 | Freq: Every day | 11 refills | Status: DC

## 2020-09-01 NOTE — Progress Notes (Signed)
    SUBJECTIVE:   CHIEF COMPLAINT / HPI: Diabetes management and oral thrush  Diabetes and thrush Patient reports adherence to increased dose of 75 units of Lantus twice daily. She reports having a low measurement of 49 at 2 AM this morning. She reports waking up and feeling unwell and unsteady on her feet so she checked her blood sugar. She reports blood glucoses ranging in the 350s fasting and down to 248 after her mealtime insulin. She has listed allergy to metformin. She was unable to start jardiance due to insurance issues but now has insurance again. She states that her fasting blood sugars are often elevated as high as 360-370s.  She reports that she has/has not been able to check her blood glucoses 1-2 hours after meals.  She reports taking 45 units with meals 3-4 times per day, depending on how much she eats.  PERTINENT  PMH / PSH: Diabetes  OBJECTIVE:   BP 132/80   Pulse 93   Ht 5\' 3"  (1.6 m)   Wt 216 lb 6.4 oz (98.2 kg)   LMP 08/03/2013   SpO2 95%   BMI 38.33 kg/m   General: female appearing stated age in no acute distress HEENT: MMM, no oral lesions noted,Neck non-tender without lymphadenopathy Cardio: Normal S1 and S2, no S3 or S4. Rhythm is regular. No murmurs or rubs.  Bilateral radial pulses palpable Pulm: Clear to auscultation bilaterally, no crackles, wheezing, or diminished breath sounds. Normal respiratory effort Abdomen: Bowel sounds normal. Abdomen soft and non-tender.  Extremities: No peripheral edema. Warm & well perfused.  Neuro: pt alert and oriented x4, follows commands, PERRLA, EOMI bilaterally  ASSESSMENT/PLAN:   Hyperglycemia due to diabetes mellitus ([place]) Last A1c 11.9 in Dec 2021  Blood glucose POCT elevated at 358  Currently taking 75 units of lantus twice daily with 45 units with meals of humalog  Unable to tolerate metformin, unable to tolerate victoza or ozempic Discussed case with Dr. Georgina Peer who has been working with patient for glucose  control Will start Jardiance as patient was never able to start it when previously prescribed  Patient to decrease lantus to 70 units BID, do not increase unless provider advises it  Continue 45 units with meals  Due to recent low of 49, encouraged bed time snack and advised patient to not take 45 units of meal time insulin unless she has full meal with carbohydrates  Follow up in 2 days with phone call from Dr. Kingsley Plan, [place]

## 2020-09-01 NOTE — Patient Instructions (Addendum)
Please take 70 units of lantus twice daily   Continue 45 units of Humalog with meal   Dr. Georgina Peer will contact you on 09/03/2020 to follow up.   Please try to take a nighttime snack prior to bedtime to help maintain blood sugar overnight.

## 2020-09-01 NOTE — Assessment & Plan Note (Addendum)
Last A1c 11.9 in Dec 2021  Blood glucose POCT elevated at 358  Currently taking 75 units of lantus twice daily with 45 units with meals of humalog  Unable to tolerate metformin, unable to tolerate victoza or ozempic Discussed case with Dr. Georgina Peer who has been working with patient for glucose control Will start Jardiance as patient was never able to start it when previously prescribed  Patient to decrease lantus to 70 units BID, do not increase unless provider advises it  Continue 45 units with meals  Due to recent low of 49, encouraged bed time snack and advised patient to not take 45 units of meal time insulin unless she has full meal with carbohydrates  Follow up in 2 days with phone call from Dr. Georgina Peer

## 2020-09-02 ENCOUNTER — Other Ambulatory Visit: Payer: Self-pay | Admitting: Family Medicine

## 2020-09-03 ENCOUNTER — Telehealth: Payer: Self-pay | Admitting: Pharmacist

## 2020-09-03 NOTE — Telephone Encounter (Signed)
Attempted to call patient this morning before work to discuss hypoglycemic event and blood glucose readings. Patient did not answer phone and voicemail not set up.  Discussed with Dr. Ouida Sills and will place sample Elenor Legato if patient is willing on 09/08/20 at appt with PCP.

## 2020-09-07 ENCOUNTER — Telehealth: Payer: Self-pay | Admitting: Family Medicine

## 2020-09-07 NOTE — Telephone Encounter (Signed)
**  After Hours/ Emergency Line Call**  Received a page to call [name].  Mrs Mcdaris states her CBG was 3. Denies being symptomatic. She ate dinner (biscuits and gravy) and repeat CBG 109. She takes 45u QID, 70u BID Lantus. Took AM dose of Lantus, and two doses of Humalog today. Asked pt to recheck CBG now and it was 117. Asked pt to skip bedtime dose of Humalog and keep Lantus as long as she eats a bedtime snack. She has a previously schedule follow up appt with Dr. Ouida Sills tomorrow. Will forward to PCP.   Lyndee Hensen, DO PGY-2, [facility] 09/07/2020

## 2020-09-07 NOTE — Progress Notes (Signed)
    SUBJECTIVE:   CHIEF COMPLAINT / HPI:   T2DM: Patient currently taking 45u QID, 70u BID Lantus. Patient reports her most recent morning blood sugar is <120. Last night patient's blood sugar got down to 60 or so. Today pharmacy visited the patient and placed a freestyle Libre as a trial to see if they could better track her blood sugars and where she would benefit from adjustments.   BL Lower extremity edema: the patient reports lower extremity swelling that started a couple days ago. The patient says it is so bad she cannot put her shoes on (patient walked into clinic today wearing socks and no shoes). The swelling goes all the way up to her mid thigh on both legs and is even. She says the swelling is least when she gets up in the morning and has been sleeping with her legs flat. During the day when the patient sits to work at home the swelling gets worse. The swelling then decreases when she gets up from her desk and walks around a lot. She does have compression stockings but has not been wearing them. The patient is prescribed Lasix 20mg  BID which she has been taking however she says she has not been putting out as much urine. She reports she usually drinks about 6 full glasses of water daily due to being thirsty. She denies orthopnea (states she has no problems lying flat to sleep), shortness of breath. Patient is well appearing today, speaking in full sentences without cough or shortness of breath. She denies chest pain, cough, weight loss. In fact, the patient appears to have gained ~14 lbs since her last visit only 1 week ago (previous weight 2216 lbs, today weighs 230 lbs). Last Echo September 2020 showed LVEF greater than 77% diastolic pseudonormalization.  This patient's presentation and concerns were precepted with the Fellow provider available.   PERTINENT  PMH / PSH:  Poorly controlled T2DM, memory difficulties, CAD s/p stent placement, HLD, HTN  OBJECTIVE:   BP 132/70   Pulse 96    Ht 5\' 3"  (1.6 m)   Wt 230 lb (104.3 kg)   LMP 08/03/2013   SpO2 94%   BMI 40.74 kg/m    Physical exam: General: No apparent distress Respiratory: Moving air well, comfortable work of breathing, distant breath sounds, questionable mild crackles appreciated to bilateral lung bases Cardio: RRR, S1-S2 appreciated Extremities: 1+ pitting edema appreciated to bilateral lower extremities up to mid thigh, 2+ DP and TP pulses appreciated to extremities bilaterally with brisk capillary refill   ASSESSMENT/PLAN:   Type 2 diabetes mellitus with hyperglycemia ([place]) -Freestyle libre set up for patient today, plan to track sugar readings at home and adjust medications as needed this Friday, 09/12/2020  Leg swelling Significant bilateral lower extremity swelling appreciated on physical exam with 14 pound weight gain in 1 week.  Interestingly patient with no orthopnea.  Questionable crackles appreciated on physical exam to bilateral lung bases.  Patient given Lasix 40 mg IM in the office. Patient takes Lasix 20mg  BID at home and reports decreased urinary output with this over the last week.  -BMP (check for baseline potassium) and BNP checked today.  Interestingly BNP only elevated at 114, however patient with BMI 38 at baseline, question that this is not falsely low?   -Patient instructed to take Lasix 80 mg daily.  -Plan to follow-up this Friday morning 09/12/2020     [place]

## 2020-09-08 ENCOUNTER — Ambulatory Visit (INDEPENDENT_AMBULATORY_CARE_PROVIDER_SITE_OTHER): Payer: PRIVATE HEALTH INSURANCE | Admitting: Family Medicine

## 2020-09-08 ENCOUNTER — Encounter: Payer: Self-pay | Admitting: Family Medicine

## 2020-09-08 ENCOUNTER — Other Ambulatory Visit: Payer: Self-pay

## 2020-09-08 VITALS — BP 132/70 | HR 96 | Ht 63.0 in | Wt 230.0 lb

## 2020-09-08 DIAGNOSIS — Z794 Long term (current) use of insulin: Secondary | ICD-10-CM | POA: Diagnosis not present

## 2020-09-08 DIAGNOSIS — M7989 Other specified soft tissue disorders: Secondary | ICD-10-CM

## 2020-09-08 DIAGNOSIS — E1165 Type 2 diabetes mellitus with hyperglycemia: Secondary | ICD-10-CM

## 2020-09-08 MED ORDER — FREESTYLE LIBRE READER DEVI: 1.0000 | Freq: Every day | 0 refills | Status: DC

## 2020-09-08 MED ORDER — VENLAFAXINE HCL ER 150 MG PO CP24: 150.0000 mg | ORAL_CAPSULE | Freq: Every day | ORAL | 1 refills | Status: AC

## 2020-09-08 MED ORDER — FREESTYLE LIBRE SENSOR SYSTEM MISC: 1.0000 | Freq: Every day | 3 refills | Status: DC

## 2020-09-08 MED ORDER — PEN NEEDLES 32G X 4 MM MISC
1.0000 | Freq: Every day | 11 refills | Status: DC
Start: 2020-09-08 — End: 2020-11-10

## 2020-09-08 MED ORDER — FUROSEMIDE 20 MG PO TABS
80.0000 mg | ORAL_TABLET | Freq: Once | ORAL | Status: DC
Start: 2020-09-08 — End: 2020-09-08

## 2020-09-08 MED ORDER — FUROSEMIDE 10 MG/ML IJ SOLN
40.0000 mg | Freq: Once | INTRAMUSCULAR | Status: AC
  Administered 2020-09-08: 40 mg via INTRAMUSCULAR

## 2020-09-08 NOTE — Patient Instructions (Addendum)
Thank you for coming in to see [place] today! Please see below to review our plan for today's visit:  1. Take Lasix 80mg  (4 tablets) once daily Tuesday, Wednesday, Thursday and Friday morning.  2. Cut back your water intake to half of what it has been.  3. Try your best to wear your compression stockings.   Your next follow up appt is with me Friday 2/11 at 10:10am.  Please call the clinic at [phone] if your symptoms worsen or you have any concerns. It was our pleasure to serve you!   Dr. Milus Banister [facility]

## 2020-09-09 ENCOUNTER — Telehealth: Payer: Self-pay

## 2020-09-09 ENCOUNTER — Encounter: Payer: Self-pay | Admitting: Pharmacist

## 2020-09-09 LAB — BRAIN NATRIURETIC PEPTIDE: BNP: 114.3 pg/mL — ABNORMAL HIGH (ref 0.0–100.0)

## 2020-09-09 LAB — BASIC METABOLIC PANEL
BUN/Creatinine Ratio: 17 (ref 9–23)
BUN: 10 mg/dL (ref 6–24)
CO2: 19 mmol/L — ABNORMAL LOW (ref 20–29)
Calcium: 8.7 mg/dL (ref 8.7–10.2)
Chloride: 106 mmol/L (ref 96–106)
Creatinine, Ser: 0.6 mg/dL (ref 0.57–1.00)
GFR calc Af Amer: 118 mL/min/{1.73_m2} (ref >59)
GFR calc non Af Amer: 102 mL/min/{1.73_m2} (ref >59)
Glucose: 245 mg/dL — ABNORMAL HIGH (ref 65–99)
Potassium: 4.8 mmol/L (ref 3.5–5.2)
Sodium: 141 mmol/L (ref 134–144)

## 2020-09-09 LAB — FUNGUS CULTURE, BLOOD

## 2020-09-09 NOTE — Telephone Encounter (Signed)
Patient calls nurse line regarding concerns for low blood sugar in the evening. Patient reports that she is taking 70 units of Lantus BID. Patient states that last night she was having symptoms of low blood sugar that was waking her up throughout the night. Patient states that she was set up with CGM yesterday and pharmacist Ernst Bowler) would have these readings. Patient states that she currently feels fine and is having no symptoms.   Will forward to PCP and Rachelle for how patient should continue on Lantus. Patient has concerns that evening dose may be too high.   Talbot Grumbling, RN

## 2020-09-09 NOTE — Telephone Encounter (Signed)
Created in error

## 2020-09-10 ENCOUNTER — Telehealth: Payer: Self-pay | Admitting: Pharmacist

## 2020-09-10 DIAGNOSIS — M7989 Other specified soft tissue disorders: Secondary | ICD-10-CM | POA: Insufficient documentation

## 2020-09-10 MED ORDER — FREESTYLE LIBRE 2 READER DEVI: 1.0000 | Freq: Every day | 0 refills | Status: AC

## 2020-09-10 MED ORDER — FREESTYLE LIBRE 2 SENSOR MISC: 1.0000 | Freq: Every day | 6 refills | Status: AC

## 2020-09-10 NOTE — Telephone Encounter (Signed)
Spoke to patient on phone to discuss her call to clinic yesterday regarding hypoglycemic events. Upon review of patient's Libre readings she has had hypoglycemic events the last two nights. I discussed with Dr. Ouida Sills and recommended on phone for patient to decrease evening dose of Lantus by 5 units tonight and then if she still has hypoglycemic event(s) tonight to go down by 5 more units tomorrow night. Patient has f/u appt in clinic with PCP on 09/12/20 in which I will discuss her readings and further management.

## 2020-09-10 NOTE — Assessment & Plan Note (Signed)
Significant bilateral lower extremity swelling appreciated on physical exam with 14 pound weight gain in 1 week.  Interestingly patient with no orthopnea.  Questionable crackles appreciated on physical exam to bilateral lung bases.  Patient given Lasix 40 mg IM in the office. Patient takes Lasix 20mg  BID at home and reports decreased urinary output with this over the last week.  -BMP (check for baseline potassium) and BNP checked today.  Interestingly BNP only elevated at 114, however patient with BMI 38 at baseline, question that this is not falsely low?   -Patient instructed to take Lasix 80 mg daily.  -Plan to follow-up this Friday morning 09/12/2020

## 2020-09-10 NOTE — Assessment & Plan Note (Signed)
-  Freestyle libre set up for patient today, plan to track sugar readings at home and adjust medications as needed this Friday, 09/12/2020

## 2020-09-12 ENCOUNTER — Ambulatory Visit (INDEPENDENT_AMBULATORY_CARE_PROVIDER_SITE_OTHER): Payer: PRIVATE HEALTH INSURANCE | Admitting: Family Medicine

## 2020-09-12 ENCOUNTER — Other Ambulatory Visit: Payer: Self-pay

## 2020-09-12 ENCOUNTER — Encounter: Payer: Self-pay | Admitting: Family Medicine

## 2020-09-12 VITALS — BP 122/71 | HR 87 | Ht 63.0 in | Wt 236.6 lb

## 2020-09-12 DIAGNOSIS — E1165 Type 2 diabetes mellitus with hyperglycemia: Secondary | ICD-10-CM | POA: Diagnosis not present

## 2020-09-12 DIAGNOSIS — M7989 Other specified soft tissue disorders: Secondary | ICD-10-CM

## 2020-09-12 DIAGNOSIS — Z794 Long term (current) use of insulin: Secondary | ICD-10-CM | POA: Diagnosis not present

## 2020-09-12 NOTE — Assessment & Plan Note (Signed)
Patient's freestyle libre log reviewed, patient with highs and lows, suggestive of inconsistent eating, potentially taking too much insulin when she does not eat a full meal.  Dr. Georgina Peer from pharmacy team spoke with patient again regarding insulin dosing and consistent meals.  We came up with the following plan: - Lantus 70u AM 09:30 AM; Lantus 55u 09:30 PM - Humalog 45u AM with breakfast - Humalog 20-45u depending on how much patient eats for lunch - Humalog 20-35u for dinner/at night depending on how much he eats -Patient asked to log insulin injections AND food intake AT THE TIME WHEN THEY OCCUR

## 2020-09-12 NOTE — Assessment & Plan Note (Signed)
Patient with continued leg swelling, weight has gone up 6 pounds since Monday.  Patient remains without orthopnea, shortness of breath.  Lungs remain clear, vitals remained stable.  We remain unsure of the cause of patient's significant lower extremity swelling and weight gain.  Patient was placed on Lasix 80 mg, does not feel she is having significant urination on this medicine. -Patient to continue Lasix 80 mg daily -Check hepatic function panel, BMP for electrolytes, TSH, CBC to rule out anemia as cause of swelling -Ordering echocardiogram -Protein/creatinine ratio via urine test, however patient was unable to urinate, please collect at next visit

## 2020-09-12 NOTE — Progress Notes (Signed)
SUBJECTIVE:   CHIEF COMPLAINT / HPI:   T2DM: Patient currently taking 45u QID, 70u BID Lantus. Patient reports her most recent morning blood sugar is <120. Last night patient's blood sugar got down to 60 or so. Today pharmacy visited the patient and placed a freestyle Libre as a trial to see if they could better track her blood sugars and where she would benefit from adjustments. Freeestyle Libre placed at last visit has been tracking patient's blood sugars at home.   BL Lower extremity edema: patient is follow up today for lower extremity edema without orthopnea. Patient had ~14lb weight gain in 1 week in the setting of getting her diabetes under much better control. BNP at last visit elevated at 114, BMP within normal limits. Patient was asked to increase Lasix from 20mg  BID to Lasix 80mg  once daily. She reports she usually drinks 6 glasses of water daily due to thirst (patient with poorly controlled diabetes), was asked to cut this in half. She was asked to keep legs elevated at home and to wear compression stockings. Today she reports feeling more swollen but otherwise not having any problems. Today she feels worse in her legs.   She is not wearing her compression stockings today. Weight is 230>236lbs. Taking Lasix 80mg  every morning and not peeing any more.   She continues to deny orthopnea, shortness of breath.  Patient is well appearing today, speaking in full sentences without cough or shortness of breath. She denies chest pain, cough, weight loss. In fact, the patient appears to have gained 6 lbs since her last visit on day (previous weight 216, today weighs 236 pounds). Last Echo September 2020 showed LVEF greater than 24%, diastolic pseudonormalization.  PERTINENT  PMH / PSH:  Poorly controlled T2DM, memory difficulties, CAD s/p stent placement, HLD, HTN  OBJECTIVE:   BP 122/71   Pulse 87   Ht 5\' 3"  (1.6 m)   Wt 236 lb 9.6 oz (107.3 kg)   LMP 08/03/2013   SpO2 97%   BMI 41.91  kg/m    Physical exam: General: Pleasant patient, well-appearing Respiratory: CTA bilaterally, no crackles appreciated Cardio: RRR, S1-S2 present, no murmurs Extremities: Bilateral lower extremity 1+ pitting edema appreciated from ankles up to shins.  ASSESSMENT/PLAN:   Type 2 diabetes mellitus with hyperglycemia ([facility]) Patient's freestyle libre log reviewed, patient with highs and lows, suggestive of inconsistent eating, potentially taking too much insulin when she does not eat a full meal.  Dr. Georgina Peer from pharmacy team spoke with patient again regarding insulin dosing and consistent meals.  We came up with the following plan: - Lantus 70u AM 09:30 AM; Lantus 55u 09:30 PM - Humalog 45u AM with breakfast - Humalog 20-45u depending on how much patient eats for lunch - Humalog 20-35u for dinner/at night depending on how much he eats -Patient asked to log insulin injections AND food intake AT THE TIME WHEN THEY OCCUR  Leg swelling Patient with continued leg swelling, weight has gone up 6 pounds since Monday.  Patient remains without orthopnea, shortness of breath.  Lungs remain clear, vitals remained stable.  We remain unsure of the cause of patient's significant lower extremity swelling and weight gain.  Patient was placed on Lasix 80 mg, does not feel she is having significant urination on this medicine. -Patient to continue Lasix 80 mg daily -Check hepatic function panel, BMP for electrolytes, TSH, CBC to rule out anemia as cause of swelling -Ordering echocardiogram -Protein/creatinine ratio via urine test, however  patient was unable to urinate, please collect at next visit     [name], [place]

## 2020-09-12 NOTE — Patient Instructions (Signed)
Thank you for coming in to see [place] today! Please see below to review our plan for today's visit:  1. For your diabetes:  - Lantus 70u AM 09:30 AM; Lantus 55u 09:30 PM  - Humalog 45u AM with breakfast  - Humalog 20-45u depending on how much you eat for lunch  - Humalog 20-35u for dinner/at night depending on how much you eat  - Be sure to log your insulin injections AND food intake AT THE TIME WHEN  THEY OCCUR! 2. We are checking many labs today to rule out reasons for your swelling. We are ordering a repeat echocardiogram for you.  3. Follow up next week at your earliest convenience.   Please call the clinic at [phone] if your symptoms worsen or you have any concerns. It was our pleasure to serve you!   Dr. Milus Banister [facility]

## 2020-09-12 NOTE — Progress Notes (Deleted)
    SUBJECTIVE:   CHIEF COMPLAINT / HPI:   T2DM: Patient currently taking 45u QID, 70u BID Lantus. Patient reports her most recent morning blood sugar is <120. Last night patient's blood sugar got down to 60 or so. Today pharmacy visited the patient and placed a freestyle Libre as a trial to see if they could better track her blood sugars and where she would benefit from adjustments. Freeestyle Libre placed at last visit has been tracking patient's blood sugars at home.   BL Lower extremity edema: patient is follow up today for lower extremity edema without orthopnea. Patient had ~14lb weight gain in 1 week in the setting of getting her diabetes under much better control. BNP at last visit elevated at 114, BMP within normal limits. Patient was asked to increase Lasix from 20mg  BID to Lasix 80mg  once daily. She reports she usually drinks 6 glasses of water daily due to thirst (patient with poorly controlled diabetes), was asked to cut this in half. She was asked to keep legs elevated at home and to wear compression stockings. Today she reports ***.  She denies orthopnea (states she has no problems lying flat to sleep), shortness of breath. Patient is well appearing today, speaking in full sentences without cough or shortness of breath. She denies chest pain, cough, weight loss. In fact, the patient appears to have gained ~14 lbs since her last visit only 1 week ago (previous weight 2216 lbs, today weighs 230 lbs). Last Echo September 2020 showed LVEF greater than 82%, diastolic pseudonormalization.  PERTINENT  PMH / PSH:  Poorly controlled T2DM, memory difficulties, CAD s/p stent placement, HLD, HTN  OBJECTIVE:   LMP 08/03/2013    Physical exam: General: *** Respiratory: *** Cardio: *** Abdomen: ***  ASSESSMENT/PLAN:   No problem-specific Assessment & Plan notes found for this encounter.     [employer]

## 2020-09-13 LAB — CBC
Hematocrit: 40.5 % (ref 34.0–46.6)
Hemoglobin: 12.1 g/dL (ref 11.1–15.9)
MCH: 19.8 pg — ABNORMAL LOW (ref 26.6–33.0)
MCHC: 29.9 g/dL — ABNORMAL LOW (ref 31.5–35.7)
MCV: 66 fL — ABNORMAL LOW (ref 79–97)
Platelets: 411 10*3/uL (ref 150–450)
RBC: 6.12 x10E6/uL — ABNORMAL HIGH (ref 3.77–5.28)
RDW: 20.3 % — ABNORMAL HIGH (ref 11.7–15.4)
WBC: 14.8 10*3/uL — ABNORMAL HIGH (ref 3.4–10.8)

## 2020-09-13 LAB — HEPATIC FUNCTION PANEL
ALT: 84 IU/L — ABNORMAL HIGH (ref 0–32)
AST: 65 IU/L — ABNORMAL HIGH (ref 0–40)
Albumin: 4.1 g/dL (ref 3.8–4.9)
Alkaline Phosphatase: 110 IU/L (ref 44–121)
Bilirubin Total: 0.2 mg/dL (ref 0.0–1.2)
Bilirubin, Direct: <0.1 mg/dL (ref 0.00–0.40)
Total Protein: 6.6 g/dL (ref 6.0–8.5)

## 2020-09-13 LAB — BASIC METABOLIC PANEL
BUN/Creatinine Ratio: 16 (ref 9–23)
BUN: 10 mg/dL (ref 6–24)
CO2: 19 mmol/L — ABNORMAL LOW (ref 20–29)
Calcium: 9.3 mg/dL (ref 8.7–10.2)
Chloride: 105 mmol/L (ref 96–106)
Creatinine, Ser: 0.61 mg/dL (ref 0.57–1.00)
GFR calc Af Amer: 117 mL/min/{1.73_m2} (ref >59)
GFR calc non Af Amer: 102 mL/min/{1.73_m2} (ref >59)
Glucose: 91 mg/dL (ref 65–99)
Potassium: 4.3 mmol/L (ref 3.5–5.2)
Sodium: 147 mmol/L — ABNORMAL HIGH (ref 134–144)

## 2020-09-13 LAB — TSH: TSH: 6.38 u[IU]/mL — ABNORMAL HIGH (ref 0.450–4.500)

## 2020-09-15 ENCOUNTER — Ambulatory Visit (INDEPENDENT_AMBULATORY_CARE_PROVIDER_SITE_OTHER): Payer: PRIVATE HEALTH INSURANCE | Admitting: Family Medicine

## 2020-09-15 ENCOUNTER — Other Ambulatory Visit: Payer: Self-pay

## 2020-09-15 VITALS — BP 133/65 | HR 117 | Wt 230.2 lb

## 2020-09-15 DIAGNOSIS — E02 Subclinical iodine-deficiency hypothyroidism: Secondary | ICD-10-CM

## 2020-09-15 DIAGNOSIS — E1165 Type 2 diabetes mellitus with hyperglycemia: Secondary | ICD-10-CM | POA: Diagnosis not present

## 2020-09-15 DIAGNOSIS — Z794 Long term (current) use of insulin: Secondary | ICD-10-CM

## 2020-09-15 DIAGNOSIS — K760 Fatty (change of) liver, not elsewhere classified: Secondary | ICD-10-CM

## 2020-09-15 DIAGNOSIS — M7989 Other specified soft tissue disorders: Secondary | ICD-10-CM

## 2020-09-15 NOTE — Patient Instructions (Signed)
Keep up the good work.   Try less intense compression stocking when outside of your house.   I will talk to Dr. Ouida Sills about an ultrasound of your abdomen.

## 2020-09-15 NOTE — Assessment & Plan Note (Addendum)
Freestyle libre CBG readings evaluated by pharmacy team during visit.  She had several lows predominantly in the morning with glucose around 50, states she has had been having to use glucose gel to bring it up and then has trouble with higher glucose.  Pharmacy team noted she appears to stack her Lantus/Humalog and has not been able to space out every 12 for her long-acting insulin.  She is going to try to marked on the calendar to take her Lantus around 9:30 AM/p.m. every day.  Follow-up CBG readings on visit later this week.

## 2020-09-15 NOTE — Assessment & Plan Note (Signed)
TSH mildly elevated during recent lab visit, however given minimal degree of increase doubt this would be the sole etiology of her bilateral leg swelling.  Reassuringly she appears to be taking her Synthroid daily in the a.m. without any other medications.  Will defer medication management to her PCP, however may be able to recheck in 4-6 weeks without altercation.  Continue levothyroxine 50 mcg as is for now.

## 2020-09-15 NOTE — Assessment & Plan Note (Signed)
Mild elevation of AST/ALT on 2/11, discussed this with patient today.  Additionally had evidence of hepatic steatosis on CT abdomen in 11/2019.  Could consider obtaining RUQ U/S for further evaluation in addition to interval repeat of hepatic panel, however will defer to ordering provider/PCP.

## 2020-09-15 NOTE — Assessment & Plan Note (Addendum)
Improving.  Unclear etiology thus far but do suspect a large component of venous insufficiency, however with pending continued work-up (Echo etc).  Reviewed recent labs with patient today, no significant alteration to suggest cause of her recent swelling.  Unable to obtain P/cr ratio today as patient was unable to urinate, will collect on follow-up visit.  Recommend continuing Lasix dose as is, elevating legs, compression stockings, and maintain decrease fluid intake.

## 2020-09-15 NOTE — Progress Notes (Signed)
SUBJECTIVE:   CHIEF COMPLAINT / HPI: Follow-up leg swelling  Morgan [name] is a 55 year old female presenting for follow-up of subacute bilateral leg edema.  She recently was seen as follow-up for this concern on 2/11 by her PCP and had gained 6 pounds compared to her previous visit the week prior.  Unfortunately with unclear etiology of her worsening bilateral leg edema, obtained baseline labs and scheduled echocardiogram for 2/22.  She does have a history of diastolic pseudonormalization on echo 04/2019 with preserved EF.  Discussed decreasing her water intake during this visit.  Today, she reports her bilateral leg edema is improved since last week.  She did decrease the amount of coffee and water that she has been drinking and has noticed an improvement.  States she is now seeing some definition in her toes and ankles compared to previous.  She continues to take Lasix 80 mg daily with appropriate diuresis by mid afternoon.  Denies any chest pain, shortness of breath, orthopnea.  PERTINENT  PMH / PSH: Type 2 diabetes, hypothyroidism, mild renal insufficiency, hypertension, bipolar depression, hyperlipidemia  OBJECTIVE:   BP 133/65   Pulse (!) 117   Wt 230 lb 3.2 oz (104.4 kg)   LMP 08/03/2013   SpO2 94%   BMI 40.78 kg/m   General: Alert, NAD HEENT: NCAT, MMM Cardiac: RRR  Lungs: Clear bilaterally, no increased WOB, no crackles or bibasilar decreased breath sounds appreciated Msk: Moves all extremities spontaneously  Ext: Warm, dry, 2+ distal pulses, 1+ pitting edema from foot to mid shin bilaterally, no rash present on lower extremity  ASSESSMENT/PLAN:   Leg swelling Improving.  Unclear etiology thus far but do suspect a large component of venous insufficiency, however with pending continued work-up (Echo etc).  Reviewed recent labs with patient today, no significant alteration to suggest cause of her recent swelling.  Unable to obtain P/cr ratio today as patient was unable to  urinate, will collect on follow-up visit.  Recommend continuing Lasix dose as is, elevating legs, compression stockings, and maintain decrease fluid intake.   Hypothyroidism TSH mildly elevated during recent lab visit, however given minimal degree of increase doubt this would be the sole etiology of her bilateral leg swelling.  Reassuringly she appears to be taking her Synthroid daily in the a.m. without any other medications.  Will defer medication management to her PCP, however may be able to recheck in 4-6 weeks without altercation.  Continue levothyroxine 50 mcg as is for now.  Hepatic steatosis Mild elevation of AST/ALT on 2/11, discussed this with patient today.  Additionally had evidence of hepatic steatosis on CT abdomen in 11/2019.  Could consider obtaining RUQ U/S for further evaluation in addition to interval repeat of hepatic panel, however will defer to ordering provider/PCP.  Type 2 diabetes mellitus with hyperglycemia (HCC) Freestyle libre CBG readings evaluated by pharmacy team during visit.  She had several lows predominantly in the morning with glucose around 50, states she has had been having to use glucose gel to bring it up and then has trouble with higher glucose.  Pharmacy team noted she appears to stack her Lantus/Humalog and has not been able to space out every 12 for her long-acting insulin.  She is going to try to marked on the calendar to take her Lantus around 9:30 AM/p.m. every day.  Follow-up CBG readings on visit later this week.    Follow-up scheduled with her PCP on 2/17 during our visit today, follow-up sooner if needed.  Morgan Bar  Andria [name], [place]

## 2020-09-16 ENCOUNTER — Other Ambulatory Visit: Payer: Self-pay | Admitting: Pharmacist

## 2020-09-16 DIAGNOSIS — G43709 Chronic migraine without aura, not intractable, without status migrainosus: Secondary | ICD-10-CM

## 2020-09-17 NOTE — Progress Notes (Unsigned)
SUBJECTIVE:   CHIEF COMPLAINT / HPI:   Lower extremity swelling: patient has been seen several times over the last couple of weeks due to new onset swelling in her lower extremities of unknown cause. Her weight went from 216 lbs on 09/01/2020 up to 236 lbs on 09/12/20. The patient has been taking Lasix 80mg  daily to reduce fluid.  Summary of Current work up:  09/08/20: presented to clinic with LE swelling so bad patient could not put her shoes on. No dyspnea, orthopnea. Last Echo September 2020 showed LVEF greater than 81% diastolic pseudonormalization. Weight had gone from 216 up to 230lbs at this visit. BMP was normal (except glucose 240s), BNP was slightly elevated at 114. Patient was asked to start Lasix 80mg  and follow up in a couple days for re-evaluation.  09/12/20: LE swelling about the same, weight increased again from 230 up to 236. No orthopnea, dyspnea. CBC showed elevated WBC 14.8 (history of leukocytosis), Hgb nml at 12, MCV very low at 66, PLTs nml at 411. TSH elevated at 6.4. BMP was essentially unchanged from 02/07 BMP. Hepatic Function Panel showed elevated AST (65) and ALT (84). Tried to collect urine for protein:Creatinine ratio however patient could not use the bathroom.  09/15/20: patient followed up for swelling with Dr. Higinio Plan, felt swelling was improved, weight down from 236>230 lbs. Discussed labs with patient. Elevated TSH - patient states she is taking her Synthroid 181mcg daily without food, may consider rechecking in 4-6 weeks. Slightly elevated LFTs, Hepatic steatosis seen on April 2021 CT, will order RUQ U/S to further assess if this is contributing to swelling. Echo has been ordered and is scheduled for 09/23/20. 09/18/2020: patient is follow up again today for lower extremity swelling. Her weight today is 235.5 lbs, was 230 lbs last week. Patient feels swelling is slightly worse today. CBC with chronically elevated WBC count, low MCV at 66, however Hgb normal. She denies  any shortness of breath or orthopnea  Leukocytosis: patient with history of Leukocytosis. Has not been further evaluated. She is not taking any medications that would cause this (only steroid is Fluticasone in Advair).  Seems to be a chronic issue.  T2DM: patient continues to have low blood sugars in the evenings, recordings have been evaluated on her [employer].  Patient continues to have irregular schedule for taking her medications, eating, her recordings of when she is eating are inconsistent with her blood sugar readings.  Hepatic Steatosis: as seen on April 2021 CT scan. Patient with slightly elevated AST/ALT on previous Hepatic Function panel. -RUQ [place]  PERTINENT  PMH / PSH:  HTN, HLD, Hepatic Steatosis, T2DM  OBJECTIVE:   Pulse 77   Ht 5\' 3"  (1.6 m)   Wt 235 lb 8 oz (106.8 kg)   LMP 08/03/2013   SpO2 97%   BMI 41.72 kg/m    Physical exam: General: Well-appearing, no apparent distress Respiratory: CTA bilaterally, comfortable work of breathing, no crackles appreciated physical exam Lower extremity:  ASSESSMENT/PLAN:   Type 2 diabetes mellitus with hyperglycemia ([place]) Patient continues low readings, inconsistent times of taking her medications. - Lantus 55u AM 09:30 AM; Lantus 55u 09:30 PM - Humalog 35u AM with breakfast - Humalog 35u with a proper lunch; only 10 units with a snack - Humalog 35u for dinner/at night; take only 10 units with a snack - Patient asked to log insulin injections & food intake at the time when they occur (has not been doing this) -Patient to follow-up  Monday 2/21  Low mean corpuscular volume (MCV) As appreciated on CBC and previous lab results.  Most consistent with hemoglobinopathy.  Patient with history of thalassemia minor. -Check ferritin today  Leukocytosis Chronic issue noted on CBCs. -May continue to monitor in the future  Leg swelling Lower extremity swelling worse at today's visit than previous visit.  Patient with weight  gain.  Patient has been taking Lasix 80-100 mg once daily without any increase of urinary output. -We will continue to work-up with abdominal ultrasound, pelvic ultrasound -protein/creatinine ratio   Follow up next week  [place]

## 2020-09-18 ENCOUNTER — Other Ambulatory Visit: Payer: Self-pay

## 2020-09-18 ENCOUNTER — Ambulatory Visit (INDEPENDENT_AMBULATORY_CARE_PROVIDER_SITE_OTHER): Payer: PRIVATE HEALTH INSURANCE | Admitting: Family Medicine

## 2020-09-18 ENCOUNTER — Encounter: Payer: Self-pay | Admitting: Family Medicine

## 2020-09-18 ENCOUNTER — Telehealth: Payer: Self-pay

## 2020-09-18 VITALS — HR 77 | Ht 63.0 in | Wt 235.5 lb

## 2020-09-18 DIAGNOSIS — E1165 Type 2 diabetes mellitus with hyperglycemia: Secondary | ICD-10-CM | POA: Diagnosis not present

## 2020-09-18 DIAGNOSIS — M7989 Other specified soft tissue disorders: Secondary | ICD-10-CM | POA: Diagnosis not present

## 2020-09-18 DIAGNOSIS — K760 Fatty (change of) liver, not elsewhere classified: Secondary | ICD-10-CM

## 2020-09-18 DIAGNOSIS — D72829 Elevated white blood cell count, unspecified: Secondary | ICD-10-CM

## 2020-09-18 DIAGNOSIS — R718 Other abnormality of red blood cells: Secondary | ICD-10-CM | POA: Insufficient documentation

## 2020-09-18 DIAGNOSIS — Z794 Long term (current) use of insulin: Secondary | ICD-10-CM

## 2020-09-18 NOTE — Patient Instructions (Addendum)
Thank you for coming in to see Morgan [name] today! Please see below to review our plan for today's visit:  1. Leg swelling: we collected a urine sample today to assess protein output and see if that is contributing to your swelling. Continue to elevate your legs when able and wear compression stockings. We have also ordered an Ultrasound of your abdomen and pelvis to further evaluate swelling. 2. We are checking a Ferritin level on you to further evaluate your blood levels. We are rechecking your kidney function and electrolytes. 3. We are checking your urine to evaluate if high protein in your urine is a cause of the swelling.  4. Only take Lasix 1 tablet of 20mg  twice daily.   Get your echo on 09/23/2020.   Please call the clinic at [phone] if your symptoms worsen or you have any concerns. It was our pleasure to serve you!   Dr. Milus Banister [facility]

## 2020-09-18 NOTE — Assessment & Plan Note (Addendum)
As appreciated on CBC and previous lab results.  Most consistent with hemoglobinopathy.  Patient with history of thalassemia minor. -Check ferritin today

## 2020-09-18 NOTE — Telephone Encounter (Signed)
Attempted to reach pt. No answer. Unable to LVM. To inform pt of her appt at [place] location on March 24th at 9:10 for 9:30 appt. She is not to eat or drink anything before midnight. The night before appt. She is to fast for the appt at 9:10. The second exam is at 12:45 for 1:00 appt. Pt is to have a full bladder for 2nd appt. Salvatore Marvel, CMA

## 2020-09-18 NOTE — Assessment & Plan Note (Signed)
Patient continues low readings, inconsistent times of taking her medications. - Lantus 55u AM 09:30 AM; Lantus 55u 09:30 PM - Humalog 35u AM with breakfast - Humalog 35u with a proper lunch; only 10 units with a snack - Humalog 35u for dinner/at night; take only 10 units with a snack - Patient asked to log insulin injections & food intake at the time when they occur (has not been doing this) -Patient to follow-up Monday 2/21

## 2020-09-18 NOTE — Assessment & Plan Note (Signed)
Chronic issue noted on CBCs. -May continue to monitor in the future

## 2020-09-18 NOTE — Assessment & Plan Note (Signed)
Lower extremity swelling worse at today's visit than previous visit.  Patient with weight gain.  Patient has been taking Lasix 80-100 mg once daily without any increase of urinary output. -We will continue to work-up with abdominal ultrasound, pelvic ultrasound -protein/creatinine ratio

## 2020-09-19 ENCOUNTER — Telehealth: Payer: Self-pay

## 2020-09-19 LAB — PROTEIN / CREATININE RATIO, URINE
Creatinine, Urine: 27.6 mg/dL
Protein, Ur: 5.2 mg/dL
Protein/Creat Ratio: 188 mg/g creat (ref 0–200)

## 2020-09-19 LAB — FERRITIN: Ferritin: 28 ng/mL (ref 15–150)

## 2020-09-19 NOTE — Telephone Encounter (Signed)
Patient calls nurse line regarding change in insulin dosage change. Patient reports that she discussed this yesterday with Dr. Ouida Sills and Ernst Bowler, however, does not remember how she is supposed to take her insulin. Verified correct dosage with Dr. Ouida Sills. Sent patient myChart message with information.   Patient called and informed. Patient appreciative.   Talbot Grumbling, RN

## 2020-09-22 ENCOUNTER — Encounter (INDEPENDENT_AMBULATORY_CARE_PROVIDER_SITE_OTHER): Payer: Self-pay

## 2020-09-22 ENCOUNTER — Other Ambulatory Visit: Payer: Self-pay

## 2020-09-22 ENCOUNTER — Ambulatory Visit (INDEPENDENT_AMBULATORY_CARE_PROVIDER_SITE_OTHER): Payer: PRIVATE HEALTH INSURANCE | Admitting: Pharmacist

## 2020-09-22 VITALS — Wt 239.0 lb

## 2020-09-22 DIAGNOSIS — E1165 Type 2 diabetes mellitus with hyperglycemia: Secondary | ICD-10-CM

## 2020-09-22 DIAGNOSIS — Z794 Long term (current) use of insulin: Secondary | ICD-10-CM

## 2020-09-22 NOTE — Patient Instructions (Addendum)
Ms. Amero it was a pleasure seeing you today.   . The sensor is small waterproof disc that is placed on the back of the upper arm.  . There is a very thin filament that is inserted under the surface of the skin and measures the amount of glucose in the interstitial fluid.  . This system collects your sugar levels for up to 14 days and it automatically records the glucose level every 15 minutes. This will show your provider any patterns in your glucose levels.  Please remember... 1. Sensor will last 14 days 2. Sensor should be applied to area away from scarring, tattoos, irritation, and bones. 3. Starting the sensor: 1 hour warm up before BG readings available   4. Scan the sensor at least every 8 hours 5. Hold reader within 1.5 inches of sensor to scan 6. When the blood drop and magnifying glass symbol appears, test fingerstick blood glucose prior to making treatment decisions 7. Do a fingerstick blood glucose test if the sensor readings do not match how you feel 8. Remove sensor prior to magnetic resonance imaging (MRI), computed tomography (CT) scan, or high-frequency electrical heat (diathermy) treatment. 9. Freestyle Libre may be worn through a Environmental education officer. It may not be exposed to an advanced Imaging Technology (AIT) body scanner (also called a millimeter wave scanner) or the baggage x-ray machine. Instead, ask for hand-wanding or full-body pat-down and visual inspection.  10. Doses of vitamin C (ascorbic acid) >500 mg every day may cause false high readings. 11. Do not submerge more than 3 feet or keep underwater longer than 30 minutes at a time. Gently pat to dry.  12. Store sensor kit between 39 and 77 degrees Farenheit. Can be refrigerated within this temperature range.  Problems with Freestyle Libre sticking? 1. Order Tegaderm I.V. films to place directly over [facility] sensor on arm. 2. May also order Skin Tac from [facility]. Alcohol swab area you plan to  administer Freestyle Libre then let dry. Once dry, apply Skin Tac in a circular motion (with a spot in the middle for sensor without skin tac) and let dry. Once dry you can apply Freestyle Libre   Problems taking off Freestyle [place]? 1. Remember to try to shower before removing Freestyle Libre 2. Order Tac Away to help remove any extra adhesive left on your skin once you remove Freestyle Libre 3. May also try baby oil to loosen adhesive  [place] Phone number: [phone] Available 7 days a week; excluding holidays 8 AM to 8PM EST  [email or web address]  Please do the following:  1. Decrease Humalog to 10 units with meals/snacks as directed today during your appointment. If you have low blood sugar before a meal do not take Humalog. you have any questions or if you believe something has occurred because of this change, call me or your doctor to let one of [place] know.  2. Continue checking blood sugars at home. It's really important that you record these and bring these in to your next doctor's appointment.  3. Continue making the lifestyle changes we've discussed together during our visit. Diet and exercise play a significant role in improving your blood sugars.  4. Follow-up with me in two weeks to remove [place].  Hypoglycemia or low blood sugar:   Low blood sugar can happen quickly and may become an emergency if not treated right away.   While this shouldn't happen often, it can be brought upon if you skip a meal  or do not eat enough. Also, if your insulin or other diabetes medications are dosed too high, this can cause your blood sugar to go to low.   Warning signs of low blood sugar include: 1. Feeling shaky or dizzy 2. Feeling weak or tired  3. Excessive hunger 4. Feeling anxious or upset  5. Sweating even when you aren't exercising  What to do if I experience low blood sugar? Follow the Rule of 15 1. Check your blood sugar. If lower than 70, proceed to step 2.   2. Treat with 15 grams of fast acting carbs which is found in 3-4 glucose tablets. If none are available you can try hard candy, 1 tablespoon of sugar or honey,4 ounces of fruit juice, or 6 ounces of REGULAR soda.  3. Re-check your sugar in 15 minutes. If it is still below 70, do what you did in step 2 again. If your blood sugar has come back up, go ahead and eat a snack or small meal made up of complex carbs (ex. Whole grains) and protein at this time to avoid recurrence of low blood sugar.

## 2020-09-22 NOTE — Progress Notes (Signed)
S:     Patient arrives in good spirits.  Patient presents today for diabetes management and Libre 2.0 application. Patient was referred by Dr. Nancy Fetter on 07/14/2021, and last seen by PCP Dr. Ouida Sills on 09/18/20.  Patient reports diabetes was diagnosed over 25 years ago.   Family/Social History:  - Tobacco: former smoker (quit 2015)  Insurance coverage/medication affordability: currently uninsured d/t quitting job but new insurance will begin in April  Medication adherence reported suboptimal as it varies. Over past two days patient reports non-adherence to medications d/t bipolar disorder and frustrations regarding blood glucose management. Currently seen by outside provider for bipolar disorder and has upcoming appointment for further management.  Current diabetes medications include: - Lantus 55 units AM and 55 units PM  - Humalog 10-35 units w/ meals  Previously tried diabetes medications include: Ozempic (severe N/V leading to DKA), Victoza (intolerance), metformin (intolerance).  Current hypertension medications include: losartan (Cozaar) 100 mg daily, metoprolol tartrate (Lopressor) 50 mg twice dailiy  Current hyperlipidemia medications include: atorvastatin (Lipitor) 80 mg daily  Patient reports polyuria. Denies any new polydipsia or polyphagia. Patient reports hypoglycemic events. Patient-reported exercise habits: largely sedentary.  Patient reported dietary habits:  Irregular eating habits that varies from day to day; Boluses between 10-35 units depending on size of meal Breakfast: sausage + biscuits; pancakes; scrambled eggs + sausage (claims this tends to be a more carb heavy meal) Lunch: deli meat + cheese, tsp of peanut butter Dinner: quick frozen meal Snacks: leftovers from the night before (around 2 pm); occasionally wakes up in middle of night and will eat a small carb based snack Drinks: mainly coffee all day long with sugar and milk  Freestyle Libre 2.0 patient  education Patient taking >500 mg Vitamin C: denies Reminded patient to not take Vitamin C with [employer].  Instruction: [employer] 14-day Freestyle Libre patient education  Sensor application -- sensor placed on left arm 1. Site selection and site prep with alcohol pad/Skin Tac 2. Sensor prep-sensor pack and sensor applicator 3. Starting the sensor: 1 hour warm up before BG readings available     Will ask for fingersticks the first 12 hours   4. Sensor change every 14 days and rotate site 5. Call [employer] customer service if sensor comes off before 14 days  Safety and Troubleshooting 1. Scan the sensor at least every 8 hours 2. When the "test BG" symbol appears, test fingerstick blood glucose prior to    making treatment decisions 3. Do a fingerstick blood glucose test if the sensor readings do not match how    you feel 4. Remove sensor prior for MRI or CT. Sensor may be damaged by exposure to    airport x-ray screening 5. Vitamin C may cause false high readings and aspirin may cause false low     readings 6. Store sensor kit between 39 and 77 degrees. Can be refrigerated at this temp.  Contact information provided for [employer] customer service and/or trainer.  O:    Labs:   Vitals:   09/22/20 1340  Weight: 239 lb (108.4 kg)  BMI (Calculated): 42.35    Lab Results  Component Value Date   HGBA1C 11.9 (A) 07/07/2020   HGBA1C 14.7 (A) 04/03/2020   HGBA1C 14.5 (H) 11/24/2019    No results found for: CPEPTIDE     Component Value Date/Time   CHOL 124 04/03/2020 1351   TRIG 279 (H) 04/03/2020 1351   HDL 30 (L) 04/03/2020 1351  CHOLHDL 4.1 04/03/2020 1351   CHOLHDL 4.2 06/15/2019 0251   VLDL 49 (H) 06/15/2019 0251   LDLCALC 50 04/03/2020 1351   LDLDIRECT 144.0 09/25/2018 1505    No results found for: MICRALBCREAT   Clinical Atherosclerotic Cardiovascular Disease (ASCVD): YES  PHQ-9 Score: patient chooses not to complete at each visit  Assessment: Diabetes  longstanding currently poorly controlled due to fluctuation in dietary habits and lack of routine day to day patterns leading to HYPO and HYPERglycemic events. Freestyle Libre 2.0 CGM placed on patient's left arm successfully. Discussed with patient importance of developing daily patterns to help with medication adherence and blood glucose control. Patient agreeable to make several dietary changes and work on continuing to appropriately take insulin.  Plan: 1. Decrease Humalog to 10 units with every meal and snack 2. Continue to use Freestyle Libre 2.0 CGM 3. Extensively discussed pathophysiology of diabetes, recommended lifestyle interventions, dietary effects on blood sugar control 4. Counseled on s/sx of and management of hypoglycemia 5. Next A1C anticipated in early March.  Follow-up appointment with PCP on 09/26/20 and pharmacy team will discuss further diabetes management with patient during that time  This appointment required 60 minutes of patient care (this includes precharting, chart review, review of results, face-to-face care, etc.).  Thank you for involving pharmacy to assist in providing this patient's care.  Discussed patient and recommendations with Dr. Harolyn Rutherford, DO

## 2020-09-23 ENCOUNTER — Ambulatory Visit (HOSPITAL_COMMUNITY)
Admission: RE | Admit: 2020-09-23 | Discharge: 2020-09-23 | Disposition: A | Discharge status: Home / Self Care | Payer: Self-pay | Source: Ambulatory Visit | Attending: Family Medicine | Admitting: Family Medicine

## 2020-09-23 DIAGNOSIS — R600 Localized edema: Secondary | ICD-10-CM | POA: Insufficient documentation

## 2020-09-23 DIAGNOSIS — E119 Type 2 diabetes mellitus without complications: Secondary | ICD-10-CM | POA: Insufficient documentation

## 2020-09-23 DIAGNOSIS — M7989 Other specified soft tissue disorders: Secondary | ICD-10-CM

## 2020-09-23 DIAGNOSIS — E785 Hyperlipidemia, unspecified: Secondary | ICD-10-CM | POA: Insufficient documentation

## 2020-09-23 DIAGNOSIS — I10 Essential (primary) hypertension: Secondary | ICD-10-CM | POA: Insufficient documentation

## 2020-09-23 LAB — ECHOCARDIOGRAM COMPLETE
Area-P 1/2: 3.08 cm2
S' Lateral: 1.9 cm

## 2020-09-23 NOTE — Progress Notes (Signed)
Echocardiogram 2D Echocardiogram has been performed.  Oneal Deputy Luna Audia 09/23/2020, 3:33 PM

## 2020-09-25 ENCOUNTER — Ambulatory Visit: Payer: PRIVATE HEALTH INSURANCE

## 2020-09-25 ENCOUNTER — Ambulatory Visit: Payer: PRIVATE HEALTH INSURANCE | Admitting: Family Medicine

## 2020-09-25 ENCOUNTER — Other Ambulatory Visit: Payer: Self-pay

## 2020-09-25 ENCOUNTER — Ambulatory Visit
Admission: RE | Admit: 2020-09-25 | Discharge: 2020-09-25 | Disposition: A | Discharge status: Home / Self Care | Payer: Medicaid Other | Source: Ambulatory Visit | Attending: Family Medicine | Admitting: Family Medicine

## 2020-09-25 DIAGNOSIS — M7989 Other specified soft tissue disorders: Secondary | ICD-10-CM

## 2020-09-26 ENCOUNTER — Encounter: Payer: Self-pay | Admitting: Family Medicine

## 2020-09-26 ENCOUNTER — Ambulatory Visit (INDEPENDENT_AMBULATORY_CARE_PROVIDER_SITE_OTHER): Payer: Self-pay | Admitting: Family Medicine

## 2020-09-26 VITALS — BP 130/83 | Ht 63.0 in | Wt 235.6 lb

## 2020-09-26 DIAGNOSIS — M7989 Other specified soft tissue disorders: Secondary | ICD-10-CM

## 2020-09-26 DIAGNOSIS — D563 Thalassemia minor: Secondary | ICD-10-CM

## 2020-09-26 DIAGNOSIS — Z114 Encounter for screening for human immunodeficiency virus [HIV]: Secondary | ICD-10-CM

## 2020-09-26 DIAGNOSIS — R718 Other abnormality of red blood cells: Secondary | ICD-10-CM

## 2020-09-26 DIAGNOSIS — E1165 Type 2 diabetes mellitus with hyperglycemia: Secondary | ICD-10-CM

## 2020-09-26 DIAGNOSIS — Z794 Long term (current) use of insulin: Secondary | ICD-10-CM

## 2020-09-26 DIAGNOSIS — D72829 Elevated white blood cell count, unspecified: Secondary | ICD-10-CM

## 2020-09-26 MED ORDER — INSULIN LISPRO (1 UNIT DIAL) 100 UNIT/ML (KWIKPEN): 4.0000 [IU] | PEN_INJECTOR | Freq: Four times a day (QID) | SUBCUTANEOUS | Status: DC

## 2020-09-26 NOTE — Patient Instructions (Signed)
Thank you for coming in to see [place] today! Please see below to review our plan for today's visit:  1. We are checking blood work on you today to work up unusual findings in your CBC.  2. We will follow up in a couple weeks.   Please call the clinic at [phone] if your symptoms worsen or you have any concerns. It was our pleasure to serve you!   Dr. Milus Banister [facility]

## 2020-09-26 NOTE — Progress Notes (Signed)
SUBJECTIVE:   CHIEF COMPLAINT / HPI:   Lower extremity swelling: patient has been seen several times over the last couple of weeks due to new onset swelling in her lower extremities of unknown cause. Her weight went from 216 lbs on 09/01/2020 up to 236 lbs on 09/12/20. The patient has been taking Lasix 80mg  daily to reduce fluid.  Summary of Current work up:  09/08/20: presented to clinic with LE swelling so bad patient could not put her shoes on. No dyspnea, orthopnea. Last EchoSeptember 2020 showed LVEF greater than 74% diastolic pseudonormalization. Weight had gone from 216 up to 230lbs at this visit. BMP was normal (except glucose 240s), BNP was slightly elevated at 114. Patient was asked to start Lasix 80mg  and follow up in a couple days for re-evaluation.  09/12/20: LE swelling about the same, weight increased again from 230 up to 236. No orthopnea, dyspnea. CBC showed elevated WBC 14.8 (history of leukocytosis), Hgb nml at 12, MCV very low at 66, PLTs nml at 411. TSH elevated at 6.4. BMP was essentially unchanged from 02/07 BMP. Hepatic Function Panel showed elevated AST (65) and ALT (84). Tried to collect urine for protein:Creatinine ratio however patient could not use the bathroom.  09/15/20: patient followed up for swelling with Dr. Higinio Plan, felt swelling was improved, weight down from 236>230 lbs. Discussed labs with patient. Elevated TSH - patient states she is taking her Synthroid 124mcg daily without food, may consider rechecking in 4-6 weeks. Slightly elevated LFTs, Hepatic steatosis seen on April 2021 CT, will order RUQ U/S to further assess if this is contributing to swelling. Echo has been ordered and is scheduled for 09/23/20. 09/18/2020: patient is follow up again today for lower extremity swelling. Her weight today is 235.5 lbs, was 230 lbs last week. Patient feels swelling is slightly worse today. CBC with chronically elevated WBC count, low MCV at 66, however Hgb normal. She denies  any shortness of breath or orthopnea 09/22/2020: followed up with pharmacy, weight noted to be 239 09/26/2020: she continues to have leg swelling today. Weight is slightly improved at 235lbs (down 4 lbs from previous visit). Patient reports she has a history of beta-Thalassemia (no electrophoresis in chart). Denies shortness of breath. Swelling improves with elevating her legs. She is no longer wearing her compression stockings because she says she recently had to cut them off of her legs.   T2DM: since decreasing her insulin she is no longer having very low blood sugars however is now having high blood sugars. Her blood sugar is jumping up from 100 up to 400 but no food/drink is recorded on her app when this happens. She also continues to have irregular schedule for taking her medications, eating, her recordings of when she is eating are inconsistent with her blood sugar readings. Dr. Valentina Lucks spoke with the patient today to re-iterate recording food/drink when she consumes them.   Hypothyroidism: TSH 6.38. Patient is currently prescribed 191mcg levothyroxine at bedtime, however says she is only taking it every other day because she lost her insurance.  PERTINENT  PMH / PSH:  HTN, HLD, Hepatic Steatosis, T2DM   OBJECTIVE:   BP 130/83   Ht 5\' 3"  (1.6 m)   Wt 235 lb 9.6 oz (106.9 kg)   LMP 08/03/2013   BMI 41.73 kg/m    Physical exam: General: nontoxic appearing Respiratory: comfortable work of breathing on room air Extremities: with continued 1+ pitting edema bilaterally, somewhat spares the feet (lipidema?)  ASSESSMENT/PLAN:  Type 2 diabetes mellitus with hyperglycemia (HCC) -Continue current regimen: Lantus 55u daily, 10u short acting prior to each substantial meal, now adding 4u short acting before first cup of coffee in the AM.  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 Patient remains without respiratory complications. Feel lipidemia is possibility, consider vascular insufficiency. No cardiac cause appreciated.  -Continue to encourage compression stockings, elevating legs     [name], [place]

## 2020-09-26 NOTE — Progress Notes (Signed)
Asked by Dr. Ouida Sills to discuss diabetes control during complex visit.   Patient verbalized frustration over the CGM alarms.  She shared that the alarms are going off too frequently.  We discussed the reality that there are two types of alarms, rate of change and alarms when certain limits had been reached. After reviewing her CGM it was a pleasant realization that she had < 1 % low readings.  She also denied symptomatic low readings recently.    She requested that her high alarm be set higher.  I agreed to change her high alarm from 240 to 290.  I would like for the alarms to decrease in frequency as I believe the stress of hearing the alarms is creating anxiety and stress.   We also discussed the addition of a smaller dose of rapid acting insulin with her "coffee" in the morning.  She reports using both sugar and whole milk in her coffee and states she drinks multiple cups prior to eating anything.  I agreed with a plan to use a single dose of 4 units with her first cup of coffee in the AM.    She will continue with: 55 units of Lantus daily  And 10 units of Humalog prior to each substantial meal. 2-3 times per day.   She expressed concern over her supply of short acting insulin but upon review she may have enough for the entire month of March.  I agreed to supply her more insulin as sample for short-term if needed.  She appears to have a new job in [employer] and anticipates health insurance in April.   I will continue to support dose adjustments as requested and work with Drs. Ouida Sills and [place] to improve control gradually.

## 2020-09-29 LAB — HGB FRACTIONATION CASCADE
Hgb A2: 5.4 % — ABNORMAL HIGH (ref 1.8–3.2)
Hgb A: 94.6 % — ABNORMAL LOW (ref 96.4–98.8)
Hgb F: 0 % (ref 0.0–2.0)
Hgb S: 0 %

## 2020-09-29 LAB — HIV ANTIBODY (ROUTINE TESTING W REFLEX): HIV Screen 4th Generation wRfx: NONREACTIVE

## 2020-09-30 ENCOUNTER — Encounter: Payer: Self-pay | Admitting: Family Medicine

## 2020-09-30 DIAGNOSIS — D563 Thalassemia minor: Secondary | ICD-10-CM | POA: Insufficient documentation

## 2020-09-30 NOTE — Assessment & Plan Note (Signed)
-  Peripheral smear performed, will follow up results

## 2020-09-30 NOTE — Assessment & Plan Note (Addendum)
-  Continue current regimen: Lantus 55u daily, 10u short acting prior to each substantial meal, now adding 4u short acting before first cup of coffee in the AM.  -Asked patient to record food intake when it actually occurs

## 2020-09-30 NOTE — Assessment & Plan Note (Signed)
-  Electrophoresis performed and shows beta-thal minor -Will follow up rest of test results and manage as needed

## 2020-09-30 NOTE — Assessment & Plan Note (Signed)
Leg swelling continues, work up thus far negative except for microcytosis. Patient remains without respiratory complications. Feel lipidemia is possibility, consider vascular insufficiency. No cardiac cause appreciated.  -Continue to encourage compression stockings, elevating legs

## 2020-10-01 ENCOUNTER — Telehealth: Payer: Self-pay

## 2020-10-01 NOTE — Telephone Encounter (Signed)
Patient calls nurse line to report that swelling in feet and ankles has improved greatly. Patient reports that she has been sleeping better at night. Per patient, since improvement in sleep, swelling has improved.   Patient wanted to make provider aware.   To PCP  Talbot Grumbling, RN

## 2020-10-02 LAB — PATHOLOGIST SMEAR REVIEW
Basophils Absolute: 0.1 10*3/uL (ref 0.0–0.2)
Basos: 1 %
EOS (ABSOLUTE): 0.5 10*3/uL — ABNORMAL HIGH (ref 0.0–0.4)
Eos: 4 %
Hematocrit: 40.4 % (ref 34.0–46.6)
Hemoglobin: 12.4 g/dL (ref 11.1–15.9)
Immature Grans (Abs): 0 10*3/uL (ref 0.0–0.1)
Immature Granulocytes: 0 %
Lymphocytes Absolute: 3.7 10*3/uL — ABNORMAL HIGH (ref 0.7–3.1)
Lymphs: 32 %
MCH: 19.8 pg — ABNORMAL LOW (ref 26.6–33.0)
MCHC: 30.7 g/dL — ABNORMAL LOW (ref 31.5–35.7)
MCV: 64 fL — ABNORMAL LOW (ref 79–97)
Monocytes Absolute: 1.2 10*3/uL — ABNORMAL HIGH (ref 0.1–0.9)
Monocytes: 10 %
Neutrophils Absolute: 6.1 10*3/uL (ref 1.4–7.0)
Neutrophils: 53 %
Platelets: 362 10*3/uL (ref 150–450)
RBC: 6.27 x10E6/uL — ABNORMAL HIGH (ref 3.77–5.28)
RDW: 20.1 % — ABNORMAL HIGH (ref 11.7–15.4)
WBC: 11.7 10*3/uL — ABNORMAL HIGH (ref 3.4–10.8)

## 2020-10-14 ENCOUNTER — Ambulatory Visit: Payer: Medicaid Other | Admitting: Licensed Clinical Social Worker

## 2020-10-14 DIAGNOSIS — Z789 Other specified health status: Secondary | ICD-10-CM

## 2020-10-14 NOTE — Patient Instructions (Signed)
  Ms. Franchini  it was nice speaking with you. Please call me directly [phone] if you have questions about the goals we discussed. Goals Addressed            This Visit's Progress   . Find Help in My Community with medication        Timeframe:  Short-Term Goal Priority:  High Start Date:10/14/20                      Expected End Date: 11/14/20                      Patient Goals/Self-Care Activities: Over the next 14 days . Call pharmacy to cost of medications and which medications you are out of . Call me back with information     Why is this important?   . Knowing how and where to find help for yourself or family in your neighborhood and community is an important skill.      Ms. Boys received Care Coordination services today:  1. Care Coordination services include personalized support from designated clinical staff supervised by her physician, including individualized plan of care and coordination with other care providers 2. 24/7 contact [phone] for assistance for urgent and routine care needs. 3. Care Coordination are voluntary services and be declined at any time by calling the office.  Patient verbalizes understanding of instructions provided today.    Follow up plan: will  Wait for return call from patient  Maurine Cane, LCSW

## 2020-10-14 NOTE — Chronic Care Management (AMB) (Signed)
Care Management Clinical Social Work Note  10/14/2020 Name: Morgan [name] MRN: [id number] DOB: 1965-08-16  Morgan [name] is a 55 y.o. year old female who is a primary care patient of [name], DO.  The Care Management team was consulted for assistance with chronic disease management and coordination needs.  Engaged with patient by telephone for follow up visit in response to provider referral for social work chronic care management and care coordination services  Consent to Services: Patient agreed to services and consent obtained.   Assessment: Patient is making progress with previous goals. Reports just started a new job 2 days ago, is now receiving food stamps; has been able to get to appointment with [employer]. The only concern during this encounter is not being able to get her medication. Reports being out of medication but unable to tell LCSW which medications she has not taken. ( See Care Plan below for interventions and patient self-care actives. Recommendation: Patient may benefit from, and is in agreement to collaborating with [facility] pharmacy team for assistance.  Follow up Plan: Patient states she will f/u with LCSW on status of her medication tomorrow.  LCSW will wait for return call.  If no return call from patient will continue to outreach every 90 days as needed.   Review of patient past medical history, allergies, medications, and health status, including review of relevant consultants reports was performed today as part of a comprehensive evaluation and provision of chronic care management and care coordination services.  SDOH (Social Determinants of Health) assessments and interventions performed:    Advanced Directives Status: Not addressed in this encounter.  Care Plan  Allergies  Allergen Reactions  . Ilosone [Erythromycin] Anaphylaxis  . Keflex [Cephalexin] Anaphylaxis  . Penicillins Anaphylaxis    Has patient had a PCN reaction causing immediate  rash, facial/tongue/throat swelling, SOB or lightheadedness with hypotension: YES Has patient had a PCN reaction causing severe rash involving mucus membranes or skin necrosis: NO Has patient had a PCN reaction that required hospitalizationNO Has patient had a PCN reaction occurring within the last 10 years: NO If all of the above answers are "NO", then may proceed with Cephalosporin use.  . Iron Other (See Comments)    Pt reports condition limiting Iron intake.  . Metformin And Related Other (See Comments)    UNSPECIFIED REACTION  Pt prefers to not take this medication Side effects  . Morphine And Related Other (See Comments)    Pt reports hallucinations    Outpatient Encounter Medications as of 10/14/2020  Medication Sig Note  . albuterol (VENTOLIN HFA) 108 (90 Base) MCG/ACT inhaler Inhale 2-4 puffs into the lungs every 4 (four) hours as needed for wheezing (or cough).   [name] aspirin 81 MG tablet Take 1 tablet (81 mg total) by mouth daily.   [name] atorvastatin (LIPITOR) 80 MG tablet Take 1 tablet (80 mg total) by mouth daily.   . Cholecalciferol (VITAMIN D3) 25 MCG (1000 UT) CAPS Take 1,000-2,000 Units by mouth See admin instructions. Take 2 tablets by mouth once a day on Mon/Wed/Fri and take 1 tablet on Sun/Tues/Thurs/Sat   . Continuous Blood Gluc Receiver (FREESTYLE LIBRE 2 READER) DEVI 1 each by Does not apply route 6 (six) times daily.   . Continuous Blood Gluc Sensor (FREESTYLE LIBRE 2 SENSOR) MISC 1 each by Does not apply route 6 (six) times daily.   . empagliflozin (JARDIANCE) 25 MG TABS tablet Take 1 tablet (25 mg total) by mouth daily.   [name]  EPINEPHrine 0.3 mg/0.3 mL IJ SOAJ injection Inject 0.3 mg into the muscle once as needed for anaphylaxis (for an anaphylactic reaction).   . fluconazole (DIFLUCAN) 150 MG tablet Take 1 tablet today by mouth and one tablet tomorrow by mouth.   . fluticasone-salmeterol (ADVAIR HFA) 115-21 MCG/ACT inhaler USE 2 INHALATIONS BY MOUTH&nbsp;&nbsp;TWICE DAILY    . furosemide (LASIX) 20 MG tablet Take 1 tablet (20 mg total) by mouth 2 (two) times daily.   . Glucagon, rDNA, (GLUCAGON EMERGENCY) 1 MG KIT Inject 1 mg into the vein once as needed (FOR ONSET OF HYPOGLYCEMIA).   [name] glucose blood (TRUE METRIX BLOOD GLUCOSE TEST) test strip Use as instructed   . insulin glargine (LANTUS SOLOSTAR) 100 UNIT/ML Solostar Pen Inject 65 Units into the skin every morning AND 65 Units at bedtime. 09/22/2020: 55 units twice daily  . insulin lispro (HUMALOG KWIKPEN) 100 UNIT/ML KwikPen Inject 4-10 Units into the skin 4 (four) times daily. 4 with coffee and 10 with meals   . Insulin Pen Needle (PEN NEEDLES) 32G X 4 MM MISC 1 Device by Does not apply route 6 (six) times daily.   [name] lamoTRIgine (LAMICTAL) 200 MG tablet Take 1 tablet (200 mg total) by mouth daily.   [name] levothyroxine (SYNTHROID) 150 MCG tablet Take 1 tablet (150 mcg total) by mouth at bedtime. 3:30 Am   . losartan (COZAAR) 100 MG tablet Take 1 tablet (100 mg total) by mouth daily.   . metoprolol tartrate (LOPRESSOR) 50 MG tablet Take 1 tablet (50 mg total) by mouth 2 (two) times daily.   . Multiple Vitamin (MULTIVITAMIN WITH MINERALS) TABS tablet Take 1 tablet by mouth daily. Woman 50 +   . nitroGLYCERIN (NITROSTAT) 0.4 MG SL tablet Place 1 tablet (0.4 mg total) under the tongue every 5 (five) minutes x 3 doses as needed for chest pain.   [name] nystatin (MYCOSTATIN/NYSTOP) powder Apply 1 application topically daily as needed (infection).   . nystatin ointment (MYCOSTATIN) Apply 1 application topically daily as needed (infection).   . pantoprazole (PROTONIX) 40 MG tablet Take 1 tablet (40 mg total) by mouth 2 (two) times daily before a meal. X 8 weeks (Patient taking differently: Take 40 mg by mouth 2 (two) times daily.) 07/08/2020: .  . potassium chloride SA (KLOR-CON) 20 MEQ tablet Take 1 tablet (20 mEq total) by mouth daily.   . SUMAtriptan (IMITREX) 25 MG tablet  TAKE 1 TABLET BY MOUTH EVERY 2 HOURS AS NEEDED FOR  MIGRAINE. MAY REPEAT IN 2 HOURS IF HEADACHE PERSISTS OR RECURS. PLEASE TAKE AS SOON AS YOU FEEL YOUR MIGRAINE STARTING.  (Patient taking differently: Take 25 mg by mouth every 2 (two) hours as needed for migraine or headache. May repeat in 2 hours.)   . topiramate (TOPAMAX) 25 MG tablet Take 1 tablet (25 mg total) by mouth at bedtime. (Patient taking differently: Take 25 mg by mouth at bedtime. Take with 32m tablet for a total of 789m)   . topiramate (TOPAMAX) 50 MG tablet Take 1 tablet (50 mg total) by mouth at bedtime. (Patient taking differently: Take 50 mg by mouth at bedtime. Take with 2531mablet for a total of 32m23m  . traZODone (DESYREL) 50 MG tablet Take 50 mg by mouth at bedtime.    . veMarland Kitchenlafaxine XR (EFFEXOR-XR) 150 MG 24 hr capsule Take 1 capsule (150 mg total) by mouth daily.    No facility-administered encounter medications on file as of 10/14/2020.    Patient Active Problem List  Diagnosis Date Noted  . Beta thalassemia minor 09/30/2020  . Microcytosis 09/18/2020  . Leg swelling 09/10/2020  . Oral pharyngeal candidiasis 08/31/2020  . Chronic left shoulder pain 08/17/2020  . Hyperglycemia 07/08/2020  . Type 2 diabetes mellitus with hyperglycemia ([place]) 07/08/2020  . No-show for appointment 05/12/2020  . Cervical spinal stenosis 10/09/2019  . Status post carotid endarterectomy 07/30/2019  . Subclavian artery stenosis, left ([place]) 07/30/2019  . Migraines 07/16/2019  . Vitamin D deficiency 05/19/2019  . Hyperglycemia due to diabetes mellitus ([place]) 11/17/2018  . S/P coronary artery stent placement   . Hyperlipidemia LDL goal <70   . Hepatic steatosis   . Mild renal insufficiency 07/21/2018  . Leukocytosis 07/21/2018  . Bipolar depression ([place]) 07/21/2018  . Incidental lung nodule 07/21/2018  . Prolonged QT interval 07/21/2018  . Hyperlipidemia 05/14/2016  . Hypothyroidism 05/14/2016  . Essential hypertension 05/14/2016    Conditions to be addressed/monitored:  Help  with obtaining medication   Care Plan : Social Work  Updates made by Maurine Cane, LCSW since 10/14/2020 12:00 AM  Problem: Barriers to Treatment with obtaining medication     Goal: Obtain needed medication   Start Date: 10/14/2020  This Visit's Progress: On track  Priority: High  Current barriers:   . Patient in need of assistance with connecting to community resources for assistance with medication  . States her insurance will start April 1st but she does not have the money to get her medication. Unable to provide LCSW with which medications she is out of. . Acknowledges deficits with meeting this unmet need . Patient is unable to independently navigate community resource options without care coordination support Clinical Goals: patient will work with SW to address concerns related to getting medication  Clinical Interventions:  . Collaboration with [name], DO regarding development and update of comprehensive plan of care as evidenced by provider attestation and co-signature . Inter-disciplinary care team collaboration (see longitudinal plan of care) . Assessment of needs, barriers as well as how impacting  . Collaborated with appropriate clinical care team members regarding patient needs ( [place] team) . Other interventions provided:Emotional/Supportive Counseling Patient Goals/Self-Care Activities: Over the next 14 days . Call pharmacy to cost of medications and which medications you are out of . Call me back with information       [name], [place] / [place]   [phone] 10:31 AM

## 2020-10-16 ENCOUNTER — Other Ambulatory Visit: Payer: Self-pay | Admitting: Family Medicine

## 2020-10-16 ENCOUNTER — Telehealth: Payer: Self-pay

## 2020-10-16 DIAGNOSIS — G43709 Chronic migraine without aura, not intractable, without status migrainosus: Secondary | ICD-10-CM

## 2020-10-16 DIAGNOSIS — I10 Essential (primary) hypertension: Secondary | ICD-10-CM

## 2020-10-16 DIAGNOSIS — E039 Hypothyroidism, unspecified: Secondary | ICD-10-CM

## 2020-10-16 MED ORDER — ADVAIR HFA 115-21 MCG/ACT IN AERO: INHALATION_SPRAY | RESPIRATORY_TRACT | 0 refills | Status: DC

## 2020-10-16 MED ORDER — ADVAIR HFA 115-21 MCG/ACT IN AERO
INHALATION_SPRAY | RESPIRATORY_TRACT | 0 refills | Status: AC
  Filled 2020-10-16: qty 12, 30d supply, fill #0

## 2020-10-16 MED ORDER — LOSARTAN POTASSIUM 100 MG PO TABS: 100.0000 mg | ORAL_TABLET | Freq: Every day | ORAL | 0 refills | Status: DC

## 2020-10-16 MED ORDER — ALBUTEROL SULFATE HFA 108 (90 BASE) MCG/ACT IN AERS
INHALATION_SPRAY | RESPIRATORY_TRACT | 0 refills | Status: DC
Start: 2020-10-16 — End: 2020-10-16

## 2020-10-16 MED ORDER — ALBUTEROL SULFATE HFA 108 (90 BASE) MCG/ACT IN AERS
INHALATION_SPRAY | RESPIRATORY_TRACT | 0 refills | Status: DC
  Filled 2020-10-16: qty 8.5, 16d supply, fill #0

## 2020-10-16 MED ORDER — ATORVASTATIN CALCIUM 80 MG PO TABS: 80.0000 mg | ORAL_TABLET | Freq: Every day | ORAL | 6 refills | Status: DC

## 2020-10-16 MED ORDER — TOPIRAMATE 50 MG PO TABS
50.0000 mg | ORAL_TABLET | Freq: Every day | ORAL | 0 refills | Status: DC
  Filled 2020-10-16: qty 30, 30d supply, fill #0

## 2020-10-16 MED ORDER — ARIPIPRAZOLE 5 MG PO TABS
5.0000 mg | ORAL_TABLET | Freq: Every day | ORAL | 0 refills | Status: AC
  Filled 2020-10-16: qty 30, 30d supply, fill #0

## 2020-10-16 MED ORDER — LEVOTHYROXINE SODIUM 150 MCG PO TABS: 150.0000 ug | ORAL_TABLET | Freq: Every day | ORAL | 0 refills | Status: DC

## 2020-10-16 MED ORDER — TOPIRAMATE 50 MG PO TABS: 50.0000 mg | ORAL_TABLET | Freq: Every day | ORAL | 0 refills | Status: DC

## 2020-10-16 MED ORDER — ATORVASTATIN CALCIUM 80 MG PO TABS
80.0000 mg | ORAL_TABLET | Freq: Every day | ORAL | 6 refills | Status: DC
  Filled 2020-10-16: qty 30, 30d supply, fill #0

## 2020-10-16 MED ORDER — LEVOTHYROXINE SODIUM 150 MCG PO TABS
150.0000 ug | ORAL_TABLET | Freq: Every day | ORAL | 0 refills | Status: DC
  Filled 2020-10-16: qty 30, 30d supply, fill #0

## 2020-10-16 MED ORDER — LAMOTRIGINE 200 MG PO TABS: 200.0000 mg | ORAL_TABLET | Freq: Every day | ORAL | 0 refills | Status: DC

## 2020-10-16 MED ORDER — LAMOTRIGINE 200 MG PO TABS
200.0000 mg | ORAL_TABLET | Freq: Every day | ORAL | 0 refills | Status: AC
  Filled 2020-10-16: qty 30, 30d supply, fill #0

## 2020-10-16 MED ORDER — LOSARTAN POTASSIUM 100 MG PO TABS
100.0000 mg | ORAL_TABLET | Freq: Every day | ORAL | 0 refills | Status: DC
  Filled 2020-10-16: qty 30, 30d supply, fill #0

## 2020-10-16 MED ORDER — TOPIRAMATE 25 MG PO TABS: 25.0000 mg | ORAL_TABLET | Freq: Every day | ORAL | 0 refills | Status: DC

## 2020-10-16 MED ORDER — TOPIRAMATE 25 MG PO TABS
25.0000 mg | ORAL_TABLET | Freq: Every day | ORAL | 0 refills | Status: DC
  Filled 2020-10-16: qty 30, 30d supply, fill #0

## 2020-10-16 MED FILL — !ADVAIR HFA 115-21 MCG INHA: 115-21 | 30 days supply | Qty: 12 | Fill #0

## 2020-10-16 NOTE — Telephone Encounter (Signed)
Spoke to Ms. Cashin this morning about refills on her medication.   Her new insurance begins 10/31/20 (she started a new job), and does not have the income to pick up her meds through the mail order shes been using. She is currently out of the following meds and will need her refills sent to [place] to be MAILED to her home using a ONE TIME FREE FILL:   Lamictal Abilify Losartan Synthroid Advair Albuterol Lipitor Topamax 25mg  Topamax 50mg   I will reach out to Cheryle Horsfall at [facility] to let her know to keep an eye out for this patient, so that she can provide patient assistance forms for the inhalers. Patient is aware she will need to return the signed forms to [facility].

## 2020-10-16 NOTE — Telephone Encounter (Signed)
Ok, I'll let her know about that one. She's aware she needs to be seen by the other provider when her insurance starts back up. & Thank you! One of the techs at [facility] ended up calling [place] and transferring them :)

## 2020-10-16 NOTE — Telephone Encounter (Signed)
She said it was written from a Dr. Sanjuana Letters? For 5mg ... she said she will try to leave a message with the nurse there but she doesn't normally have luck. She also said she hasnt been seen since in a while she doesn't have insurance right now.

## 2020-10-16 NOTE — Telephone Encounter (Signed)
Ok, thank you!! I'll reach out to her & figure out that abilify.

## 2020-10-16 NOTE — Addendum Note (Signed)
Addended by: [name] on: 10/16/2020 03:57 PM   Modules accepted: Orders

## 2020-10-16 NOTE — Telephone Encounter (Signed)
She said she was due for refills on them now. [facility], they can mail them and do them as a one time free fill (no charge)... that way she has enough for the next 30 days and when she gets her next refill in april she can use her insurance.

## 2020-10-21 ENCOUNTER — Telehealth: Payer: Self-pay | Admitting: Licensed Clinical Social Worker

## 2020-10-21 NOTE — Chronic Care Management (AMB) (Signed)
    Clinical Social Work  Care Management  Unsuccessful Phone Outreach    10/21/2020 Name: Morgan [name] MRN: [id number] DOB: 12/26/1965  Morgan [name] is a 55 y.o. year old female who is a primary care patient of [name], DO .   F/U phone call today to assess needs, and progress with patient getting medication.   Telephone outreach was unsuccessful Unable to leave a HIPPA compliant phone message due to voice mail not set up.  Plan: LCSW will f/u in 90 days  Review of patient status, including review of consultants reports, relevant laboratory and other test results, and collaboration with appropriate care team members and the patient's provider was performed as part of comprehensive patient evaluation and provision of care management services.    [name], [place] / [place]   [phone] 9:16 AM

## 2020-11-02 ENCOUNTER — Other Ambulatory Visit: Payer: Self-pay | Admitting: Pharmacy Technician

## 2020-11-02 ENCOUNTER — Other Ambulatory Visit: Payer: Self-pay

## 2020-11-03 ENCOUNTER — Other Ambulatory Visit: Payer: Self-pay

## 2020-11-03 ENCOUNTER — Other Ambulatory Visit: Payer: Self-pay | Admitting: Pharmacist

## 2020-11-03 DIAGNOSIS — G43709 Chronic migraine without aura, not intractable, without status migrainosus: Secondary | ICD-10-CM

## 2020-11-05 ENCOUNTER — Telehealth: Payer: Self-pay

## 2020-11-05 NOTE — Telephone Encounter (Signed)
Patient calls nurse line stating her new insurance does not cover Lantus. Patient reports they do cover Levemir kwikpen or [employer]. Please advise on  alteratives.

## 2020-11-06 ENCOUNTER — Other Ambulatory Visit: Payer: Self-pay | Admitting: Family Medicine

## 2020-11-06 MED ORDER — BASAGLAR KWIKPEN 100 UNIT/ML ~~LOC~~ SOPN: 55.0000 [IU] | PEN_INJECTOR | Freq: Two times a day (BID) | SUBCUTANEOUS | 3 refills | Status: DC

## 2020-11-10 ENCOUNTER — Other Ambulatory Visit: Payer: Self-pay

## 2020-11-10 DIAGNOSIS — E1165 Type 2 diabetes mellitus with hyperglycemia: Secondary | ICD-10-CM

## 2020-11-10 NOTE — Telephone Encounter (Signed)
Attempted to call patient to inform, however no answer or option for VM. If patient calls back please advise of medication change.

## 2020-11-11 MED ORDER — PEN NEEDLES 32G X 4 MM MISC: 1.0000 | Freq: Every day | 11 refills | Status: DC

## 2020-11-17 ENCOUNTER — Inpatient Hospital Stay: Admission: RE | Admit: 2020-11-17 | Payer: Self-pay | Source: Ambulatory Visit

## 2020-11-21 ENCOUNTER — Emergency Department (HOSPITAL_COMMUNITY)
Admission: EM | Admit: 2020-11-21 | Discharge: 2020-11-22 | Disposition: A | Discharge status: Home / Self Care | Payer: Managed Care, Other (non HMO) | Attending: Emergency Medicine | Admitting: Emergency Medicine

## 2020-11-21 ENCOUNTER — Other Ambulatory Visit: Payer: Self-pay

## 2020-11-21 ENCOUNTER — Telehealth: Payer: Self-pay | Admitting: Family Medicine

## 2020-11-21 ENCOUNTER — Encounter (HOSPITAL_COMMUNITY): Payer: Self-pay | Admitting: *Deleted

## 2020-11-21 ENCOUNTER — Emergency Department (HOSPITAL_COMMUNITY): Payer: Managed Care, Other (non HMO)

## 2020-11-21 DIAGNOSIS — E039 Hypothyroidism, unspecified: Secondary | ICD-10-CM | POA: Diagnosis not present

## 2020-11-21 DIAGNOSIS — Z79899 Other long term (current) drug therapy: Secondary | ICD-10-CM | POA: Diagnosis not present

## 2020-11-21 DIAGNOSIS — J45909 Unspecified asthma, uncomplicated: Secondary | ICD-10-CM | POA: Diagnosis not present

## 2020-11-21 DIAGNOSIS — R079 Chest pain, unspecified: Secondary | ICD-10-CM | POA: Insufficient documentation

## 2020-11-21 DIAGNOSIS — E1110 Type 2 diabetes mellitus with ketoacidosis without coma: Secondary | ICD-10-CM | POA: Insufficient documentation

## 2020-11-21 DIAGNOSIS — Z7982 Long term (current) use of aspirin: Secondary | ICD-10-CM | POA: Insufficient documentation

## 2020-11-21 DIAGNOSIS — H8110 Benign paroxysmal vertigo, unspecified ear: Secondary | ICD-10-CM | POA: Diagnosis not present

## 2020-11-21 DIAGNOSIS — Z794 Long term (current) use of insulin: Secondary | ICD-10-CM | POA: Diagnosis not present

## 2020-11-21 DIAGNOSIS — I5021 Acute systolic (congestive) heart failure: Secondary | ICD-10-CM | POA: Insufficient documentation

## 2020-11-21 DIAGNOSIS — R110 Nausea: Secondary | ICD-10-CM | POA: Insufficient documentation

## 2020-11-21 DIAGNOSIS — Z87891 Personal history of nicotine dependence: Secondary | ICD-10-CM | POA: Insufficient documentation

## 2020-11-21 DIAGNOSIS — I110 Hypertensive heart disease with heart failure: Secondary | ICD-10-CM | POA: Insufficient documentation

## 2020-11-21 DIAGNOSIS — R42 Dizziness and giddiness: Secondary | ICD-10-CM | POA: Diagnosis present

## 2020-11-21 LAB — CBC WITH DIFFERENTIAL/PLATELET
Abs Immature Granulocytes: 0 10*3/uL (ref 0.00–0.07)
Basophils Absolute: 0.4 10*3/uL — ABNORMAL HIGH (ref 0.0–0.1)
Basophils Relative: 3 %
Eosinophils Absolute: 0.3 10*3/uL (ref 0.0–0.5)
Eosinophils Relative: 2 %
HCT: 46.9 % — ABNORMAL HIGH (ref 36.0–46.0)
Hemoglobin: 14.2 g/dL (ref 12.0–15.0)
Lymphocytes Relative: 30 %
Lymphs Abs: 4.4 10*3/uL — ABNORMAL HIGH (ref 0.7–4.0)
MCH: 19.7 pg — ABNORMAL LOW (ref 26.0–34.0)
MCHC: 30.3 g/dL (ref 30.0–36.0)
MCV: 65.2 fL — ABNORMAL LOW (ref 80.0–100.0)
Monocytes Absolute: 0.4 10*3/uL (ref 0.1–1.0)
Monocytes Relative: 3 %
Neutro Abs: 9.1 10*3/uL — ABNORMAL HIGH (ref 1.7–7.7)
Neutrophils Relative %: 62 %
Platelets: 324 10*3/uL (ref 150–400)
RBC: 7.19 MIL/uL — ABNORMAL HIGH (ref 3.87–5.11)
RDW: 19 % — ABNORMAL HIGH (ref 11.5–15.5)
WBC: 14.6 10*3/uL — ABNORMAL HIGH (ref 4.0–10.5)
nRBC: 0 % (ref 0.0–0.2)
nRBC: 0 /100 WBC

## 2020-11-21 LAB — COMPREHENSIVE METABOLIC PANEL
ALT: 23 U/L (ref 0–44)
AST: 21 U/L (ref 15–41)
Albumin: 3.9 g/dL (ref 3.5–5.0)
Alkaline Phosphatase: 104 U/L (ref 38–126)
Anion gap: 12 (ref 5–15)
BUN: 11 mg/dL (ref 6–20)
CO2: 23 mmol/L (ref 22–32)
Calcium: 9.3 mg/dL (ref 8.9–10.3)
Chloride: 99 mmol/L (ref 98–111)
Creatinine, Ser: 0.67 mg/dL (ref 0.44–1.00)
GFR, Estimated: >60 mL/min (ref >60)
Glucose, Bld: 392 mg/dL — ABNORMAL HIGH (ref 70–99)
Potassium: 3 mmol/L — ABNORMAL LOW (ref 3.5–5.1)
Sodium: 134 mmol/L — ABNORMAL LOW (ref 135–145)
Total Bilirubin: 0.5 mg/dL (ref 0.3–1.2)
Total Protein: 6.8 g/dL (ref 6.5–8.1)

## 2020-11-21 LAB — MAGNESIUM: Magnesium: 1.8 mg/dL (ref 1.7–2.4)

## 2020-11-21 MED ORDER — MECLIZINE HCL 25 MG PO TABS
25.0000 mg | ORAL_TABLET | Freq: Once | ORAL | Status: AC
  Administered 2020-11-21: 25 mg via ORAL
  Filled 2020-11-21: qty 1

## 2020-11-21 MED ORDER — DIAZEPAM 2 MG PO TABS
2.0000 mg | ORAL_TABLET | Freq: Once | ORAL | Status: AC
  Administered 2020-11-21: 2 mg via ORAL
  Filled 2020-11-21: qty 1

## 2020-11-21 MED ORDER — DIAZEPAM 2 MG PO TABS: 2.0000 mg | ORAL_TABLET | Freq: Two times a day (BID) | ORAL | 0 refills | Status: DC | PRN

## 2020-11-21 NOTE — Telephone Encounter (Signed)
**  After Hours/ Emergency Line Call**  Received a call to report that Morgan [name] has been experiencing dizziness and nausea for the past 12 hours or so.  She states that about a week ago she did have a fall and hit her head and has since been feeling normal, however last night her room was feeling warm and she felt nauseated and dizzy.  She felt as though things were moving around her.  She also states that she had some epigastric pain yesterday.  She denies chest pain at this time.  She is not currently dizzy.  She states she has a history of strokes and an MI.  She is not sure if the symptoms are simply from the room being overly hot or from some other etiology..   I attempted to get her an appointment this morning for same-day visit at our clinic, however unfortunately nothing was available.  I then recommended that the patient go to the emergency department to be evaluated.  She was somewhat resistant to this and not currently experiencing symptoms, so I recommended calling our clinic when they open in 15 minutes to see if anyone has called in to cancel for the morning.  I explained to her that if she starts having any more chest pain, epigastric pain, dizziness, or any other symptoms she needs to go to the emergency department immediately.  Patient stated that she would try to call our clinic for a same-day appointment in case someone calls and cancels right when they open, and if not would go to urgent care or the emergency department to be evaluated today.  I expressed to the patient that if she gets any new symptoms, any return of chest pain, dizziness that she should go to the emergency department regardless of if she can get a clinic visit today.  Red flags discussed.  Will forward to PCP.  Lurline Del, DO PGY-2, [facility] 11/21/2020 7:58 AM
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
## 2020-11-21 NOTE — ED Triage Notes (Signed)
Dizziness with chest pain since yesterday  No nauseas or vomiting
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
## 2020-11-22 ENCOUNTER — Ambulatory Visit (HOSPITAL_COMMUNITY): Payer: Self-pay

## 2020-12-02 ENCOUNTER — Other Ambulatory Visit: Payer: Self-pay | Admitting: Pharmacist

## 2020-12-02 DIAGNOSIS — G43709 Chronic migraine without aura, not intractable, without status migrainosus: Secondary | ICD-10-CM

## 2020-12-05 ENCOUNTER — Other Ambulatory Visit: Payer: Self-pay | Admitting: Family Medicine

## 2020-12-13 ENCOUNTER — Other Ambulatory Visit: Payer: Self-pay

## 2020-12-13 ENCOUNTER — Encounter (HOSPITAL_COMMUNITY): Payer: Self-pay

## 2020-12-13 ENCOUNTER — Emergency Department (HOSPITAL_COMMUNITY): Payer: Managed Care, Other (non HMO)

## 2020-12-13 ENCOUNTER — Inpatient Hospital Stay (HOSPITAL_COMMUNITY)
Admission: EM | Admit: 2020-12-13 | Discharge: 2020-12-19 | DRG: 637 | Disposition: A | Discharge status: Home / Self Care | Payer: Managed Care, Other (non HMO) | Attending: Family Medicine | Admitting: Family Medicine

## 2020-12-13 ENCOUNTER — Inpatient Hospital Stay (HOSPITAL_COMMUNITY): Payer: Managed Care, Other (non HMO)

## 2020-12-13 DIAGNOSIS — Z83438 Family history of other disorder of lipoprotein metabolism and other lipidemia: Secondary | ICD-10-CM

## 2020-12-13 DIAGNOSIS — F319 Bipolar disorder, unspecified: Secondary | ICD-10-CM | POA: Diagnosis present

## 2020-12-13 DIAGNOSIS — Z20822 Contact with and (suspected) exposure to covid-19: Secondary | ICD-10-CM | POA: Diagnosis present

## 2020-12-13 DIAGNOSIS — K760 Fatty (change of) liver, not elsewhere classified: Secondary | ICD-10-CM | POA: Diagnosis present

## 2020-12-13 DIAGNOSIS — E1165 Type 2 diabetes mellitus with hyperglycemia: Secondary | ICD-10-CM

## 2020-12-13 DIAGNOSIS — Z6841 Body Mass Index (BMI) 40.0 and over, adult: Secondary | ICD-10-CM

## 2020-12-13 DIAGNOSIS — D72829 Elevated white blood cell count, unspecified: Secondary | ICD-10-CM | POA: Diagnosis present

## 2020-12-13 DIAGNOSIS — Z8249 Family history of ischemic heart disease and other diseases of the circulatory system: Secondary | ICD-10-CM

## 2020-12-13 DIAGNOSIS — R9431 Abnormal electrocardiogram [ECG] [EKG]: Secondary | ICD-10-CM | POA: Diagnosis not present

## 2020-12-13 DIAGNOSIS — Z9114 Patient's other noncompliance with medication regimen: Secondary | ICD-10-CM

## 2020-12-13 DIAGNOSIS — I252 Old myocardial infarction: Secondary | ICD-10-CM | POA: Diagnosis not present

## 2020-12-13 DIAGNOSIS — Z880 Allergy status to penicillin: Secondary | ICD-10-CM

## 2020-12-13 DIAGNOSIS — K2100 Gastro-esophageal reflux disease with esophagitis, without bleeding: Secondary | ICD-10-CM | POA: Diagnosis present

## 2020-12-13 DIAGNOSIS — Z885 Allergy status to narcotic agent status: Secondary | ICD-10-CM | POA: Diagnosis not present

## 2020-12-13 DIAGNOSIS — Z888 Allergy status to other drugs, medicaments and biological substances status: Secondary | ICD-10-CM | POA: Diagnosis not present

## 2020-12-13 DIAGNOSIS — E785 Hyperlipidemia, unspecified: Secondary | ICD-10-CM | POA: Diagnosis present

## 2020-12-13 DIAGNOSIS — E1110 Type 2 diabetes mellitus with ketoacidosis without coma: Principal | ICD-10-CM | POA: Diagnosis present

## 2020-12-13 DIAGNOSIS — K920 Hematemesis: Secondary | ICD-10-CM

## 2020-12-13 DIAGNOSIS — E860 Dehydration: Secondary | ICD-10-CM | POA: Diagnosis present

## 2020-12-13 DIAGNOSIS — G43909 Migraine, unspecified, not intractable, without status migrainosus: Secondary | ICD-10-CM | POA: Diagnosis present

## 2020-12-13 DIAGNOSIS — Z8673 Personal history of transient ischemic attack (TIA), and cerebral infarction without residual deficits: Secondary | ICD-10-CM

## 2020-12-13 DIAGNOSIS — G9340 Encephalopathy, unspecified: Secondary | ICD-10-CM | POA: Diagnosis not present

## 2020-12-13 DIAGNOSIS — R778 Other specified abnormalities of plasma proteins: Secondary | ICD-10-CM | POA: Diagnosis present

## 2020-12-13 DIAGNOSIS — N179 Acute kidney failure, unspecified: Secondary | ICD-10-CM | POA: Diagnosis present

## 2020-12-13 DIAGNOSIS — I10 Essential (primary) hypertension: Secondary | ICD-10-CM | POA: Diagnosis present

## 2020-12-13 DIAGNOSIS — F419 Anxiety disorder, unspecified: Secondary | ICD-10-CM | POA: Diagnosis present

## 2020-12-13 DIAGNOSIS — E870 Hyperosmolality and hypernatremia: Secondary | ICD-10-CM | POA: Diagnosis present

## 2020-12-13 DIAGNOSIS — R400 Somnolence: Secondary | ICD-10-CM

## 2020-12-13 DIAGNOSIS — I2510 Atherosclerotic heart disease of native coronary artery without angina pectoris: Secondary | ICD-10-CM | POA: Diagnosis present

## 2020-12-13 DIAGNOSIS — R739 Hyperglycemia, unspecified: Secondary | ICD-10-CM

## 2020-12-13 DIAGNOSIS — E876 Hypokalemia: Secondary | ICD-10-CM | POA: Diagnosis present

## 2020-12-13 DIAGNOSIS — E039 Hypothyroidism, unspecified: Secondary | ICD-10-CM | POA: Diagnosis present

## 2020-12-13 DIAGNOSIS — Z955 Presence of coronary angioplasty implant and graft: Secondary | ICD-10-CM

## 2020-12-13 DIAGNOSIS — Z90710 Acquired absence of both cervix and uterus: Secondary | ICD-10-CM

## 2020-12-13 DIAGNOSIS — Z8711 Personal history of peptic ulcer disease: Secondary | ICD-10-CM

## 2020-12-13 DIAGNOSIS — R339 Retention of urine, unspecified: Secondary | ICD-10-CM | POA: Diagnosis present

## 2020-12-13 DIAGNOSIS — J42 Unspecified chronic bronchitis: Secondary | ICD-10-CM | POA: Diagnosis present

## 2020-12-13 DIAGNOSIS — F1590 Other stimulant use, unspecified, uncomplicated: Secondary | ICD-10-CM | POA: Diagnosis present

## 2020-12-13 DIAGNOSIS — Z9181 History of falling: Secondary | ICD-10-CM

## 2020-12-13 DIAGNOSIS — Z79899 Other long term (current) drug therapy: Secondary | ICD-10-CM

## 2020-12-13 DIAGNOSIS — Z833 Family history of diabetes mellitus: Secondary | ICD-10-CM

## 2020-12-13 DIAGNOSIS — E1169 Type 2 diabetes mellitus with other specified complication: Secondary | ICD-10-CM | POA: Diagnosis not present

## 2020-12-13 DIAGNOSIS — R000 Tachycardia, unspecified: Secondary | ICD-10-CM | POA: Diagnosis present

## 2020-12-13 DIAGNOSIS — Z87891 Personal history of nicotine dependence: Secondary | ICD-10-CM

## 2020-12-13 DIAGNOSIS — Z7951 Long term (current) use of inhaled steroids: Secondary | ICD-10-CM

## 2020-12-13 DIAGNOSIS — Z794 Long term (current) use of insulin: Secondary | ICD-10-CM | POA: Diagnosis not present

## 2020-12-13 DIAGNOSIS — G458 Other transient cerebral ischemic attacks and related syndromes: Secondary | ICD-10-CM | POA: Diagnosis present

## 2020-12-13 DIAGNOSIS — J45909 Unspecified asthma, uncomplicated: Secondary | ICD-10-CM | POA: Diagnosis present

## 2020-12-13 DIAGNOSIS — G928 Other toxic encephalopathy: Secondary | ICD-10-CM | POA: Diagnosis present

## 2020-12-13 DIAGNOSIS — R4182 Altered mental status, unspecified: Secondary | ICD-10-CM | POA: Diagnosis present

## 2020-12-13 DIAGNOSIS — Z79890 Hormone replacement therapy (postmenopausal): Secondary | ICD-10-CM

## 2020-12-13 DIAGNOSIS — R197 Diarrhea, unspecified: Secondary | ICD-10-CM | POA: Diagnosis not present

## 2020-12-13 DIAGNOSIS — E1151 Type 2 diabetes mellitus with diabetic peripheral angiopathy without gangrene: Secondary | ICD-10-CM | POA: Diagnosis present

## 2020-12-13 DIAGNOSIS — Z881 Allergy status to other antibiotic agents status: Secondary | ICD-10-CM

## 2020-12-13 DIAGNOSIS — D563 Thalassemia minor: Secondary | ICD-10-CM | POA: Diagnosis present

## 2020-12-13 DIAGNOSIS — K121 Other forms of stomatitis: Secondary | ICD-10-CM | POA: Diagnosis present

## 2020-12-13 DIAGNOSIS — E559 Vitamin D deficiency, unspecified: Secondary | ICD-10-CM | POA: Diagnosis present

## 2020-12-13 DIAGNOSIS — F39 Unspecified mood [affective] disorder: Secondary | ICD-10-CM | POA: Diagnosis not present

## 2020-12-13 DIAGNOSIS — Z7982 Long term (current) use of aspirin: Secondary | ICD-10-CM

## 2020-12-13 DIAGNOSIS — Z7984 Long term (current) use of oral hypoglycemic drugs: Secondary | ICD-10-CM

## 2020-12-13 LAB — RAPID URINE DRUG SCREEN, HOSP PERFORMED
Amphetamines: POSITIVE — AB
Barbiturates: NOT DETECTED
Benzodiazepines: NOT DETECTED
Cocaine: NOT DETECTED
Opiates: NOT DETECTED
Tetrahydrocannabinol: NOT DETECTED

## 2020-12-13 LAB — I-STAT CHEM 8, ED
BUN: 64 mg/dL — ABNORMAL HIGH (ref 6–20)
BUN: 38 mg/dL — ABNORMAL HIGH (ref 6–20)
Calcium, Ion: 1.08 mmol/L — ABNORMAL LOW (ref 1.15–1.40)
Calcium, Ion: 0.73 mmol/L — CL (ref 1.15–1.40)
Chloride: 104 mmol/L (ref 98–111)
Chloride: 123 mmol/L — ABNORMAL HIGH (ref 98–111)
Creatinine, Ser: 1.3 mg/dL — ABNORMAL HIGH (ref 0.44–1.00)
Creatinine, Ser: 0.4 mg/dL — ABNORMAL LOW (ref 0.44–1.00)
Glucose, Bld: >700 mg/dL (ref 70–99)
Glucose, Bld: 385 mg/dL — ABNORMAL HIGH (ref 70–99)
HCT: 49 % — ABNORMAL HIGH (ref 36.0–46.0)
HCT: 29 % — ABNORMAL LOW (ref 36.0–46.0)
Hemoglobin: 16.7 g/dL — ABNORMAL HIGH (ref 12.0–15.0)
Hemoglobin: 9.9 g/dL — ABNORMAL LOW (ref 12.0–15.0)
Potassium: 4.1 mmol/L (ref 3.5–5.1)
Potassium: 2.3 mmol/L — CL (ref 3.5–5.1)
Sodium: 134 mmol/L — ABNORMAL LOW (ref 135–145)
Sodium: 148 mmol/L — ABNORMAL HIGH (ref 135–145)
TCO2: 10 mmol/L — ABNORMAL LOW (ref 22–32)
TCO2: 8 mmol/L — ABNORMAL LOW (ref 22–32)

## 2020-12-13 LAB — CBC WITH DIFFERENTIAL/PLATELET
Abs Immature Granulocytes: 0.15 10*3/uL — ABNORMAL HIGH (ref 0.00–0.07)
Basophils Absolute: 0 10*3/uL (ref 0.0–0.1)
Basophils Relative: 0 %
Eosinophils Absolute: 0 10*3/uL (ref 0.0–0.5)
Eosinophils Relative: 0 %
HCT: 46.3 % — ABNORMAL HIGH (ref 36.0–46.0)
Hemoglobin: 13.4 g/dL (ref 12.0–15.0)
Immature Granulocytes: 1 %
Lymphocytes Relative: 6 %
Lymphs Abs: 1.3 10*3/uL (ref 0.7–4.0)
MCH: 19.7 pg — ABNORMAL LOW (ref 26.0–34.0)
MCHC: 28.9 g/dL — ABNORMAL LOW (ref 30.0–36.0)
MCV: 68.1 fL — ABNORMAL LOW (ref 80.0–100.0)
Monocytes Absolute: 1.8 10*3/uL — ABNORMAL HIGH (ref 0.1–1.0)
Monocytes Relative: 8 %
Neutro Abs: 19.4 10*3/uL — ABNORMAL HIGH (ref 1.7–7.7)
Neutrophils Relative %: 85 %
Platelets: 248 10*3/uL (ref 150–400)
RBC: 6.8 MIL/uL — ABNORMAL HIGH (ref 3.87–5.11)
RDW: 17.8 % — ABNORMAL HIGH (ref 11.5–15.5)
WBC: 22.6 10*3/uL — ABNORMAL HIGH (ref 4.0–10.5)
nRBC: 0 % (ref 0.0–0.2)

## 2020-12-13 LAB — GLUCOSE, CAPILLARY
Glucose-Capillary: 342 mg/dL — ABNORMAL HIGH (ref 70–99)
Glucose-Capillary: 290 mg/dL — ABNORMAL HIGH (ref 70–99)
Glucose-Capillary: 230 mg/dL — ABNORMAL HIGH (ref 70–99)
Glucose-Capillary: 216 mg/dL — ABNORMAL HIGH (ref 70–99)
Glucose-Capillary: 208 mg/dL — ABNORMAL HIGH (ref 70–99)
Glucose-Capillary: 173 mg/dL — ABNORMAL HIGH (ref 70–99)
Glucose-Capillary: 231 mg/dL — ABNORMAL HIGH (ref 70–99)

## 2020-12-13 LAB — CBG MONITORING, ED
Glucose-Capillary: >600 mg/dL (ref 70–99)
Glucose-Capillary: >600 mg/dL (ref 70–99)
Glucose-Capillary: 597 mg/dL (ref 70–99)
Glucose-Capillary: 523 mg/dL (ref 70–99)
Glucose-Capillary: 569 mg/dL (ref 70–99)
Glucose-Capillary: 474 mg/dL — ABNORMAL HIGH (ref 70–99)
Glucose-Capillary: 407 mg/dL — ABNORMAL HIGH (ref 70–99)
Glucose-Capillary: 381 mg/dL — ABNORMAL HIGH (ref 70–99)
Glucose-Capillary: 431 mg/dL — ABNORMAL HIGH (ref 70–99)
Glucose-Capillary: 422 mg/dL — ABNORMAL HIGH (ref 70–99)

## 2020-12-13 LAB — BASIC METABOLIC PANEL
Anion gap: 16 — ABNORMAL HIGH (ref 5–15)
Anion gap: 12 (ref 5–15)
Anion gap: 7 (ref 5–15)
BUN: 65 mg/dL — ABNORMAL HIGH (ref 6–20)
BUN: 54 mg/dL — ABNORMAL HIGH (ref 6–20)
BUN: 44 mg/dL — ABNORMAL HIGH (ref 6–20)
BUN: 37 mg/dL — ABNORMAL HIGH (ref 6–20)
CO2: <7 mmol/L — ABNORMAL LOW (ref 22–32)
CO2: 12 mmol/L — ABNORMAL LOW (ref 22–32)
CO2: 14 mmol/L — ABNORMAL LOW (ref 22–32)
CO2: 18 mmol/L — ABNORMAL LOW (ref 22–32)
Calcium: 7.3 mg/dL — ABNORMAL LOW (ref 8.9–10.3)
Calcium: 7.8 mg/dL — ABNORMAL LOW (ref 8.9–10.3)
Calcium: 7.9 mg/dL — ABNORMAL LOW (ref 8.9–10.3)
Calcium: 8.2 mg/dL — ABNORMAL LOW (ref 8.9–10.3)
Chloride: 109 mmol/L (ref 98–111)
Chloride: 114 mmol/L — ABNORMAL HIGH (ref 98–111)
Chloride: 118 mmol/L — ABNORMAL HIGH (ref 98–111)
Chloride: 121 mmol/L — ABNORMAL HIGH (ref 98–111)
Creatinine, Ser: 1.95 mg/dL — ABNORMAL HIGH (ref 0.44–1.00)
Creatinine, Ser: 1.65 mg/dL — ABNORMAL HIGH (ref 0.44–1.00)
Creatinine, Ser: 1.32 mg/dL — ABNORMAL HIGH (ref 0.44–1.00)
Creatinine, Ser: 1.04 mg/dL — ABNORMAL HIGH (ref 0.44–1.00)
GFR, Estimated: 30 mL/min — ABNORMAL LOW (ref >60)
GFR, Estimated: 36 mL/min — ABNORMAL LOW (ref >60)
GFR, Estimated: 47 mL/min — ABNORMAL LOW (ref >60)
GFR, Estimated: >60 mL/min (ref >60)
Glucose, Bld: 641 mg/dL (ref 70–99)
Glucose, Bld: 468 mg/dL — ABNORMAL HIGH (ref 70–99)
Glucose, Bld: 338 mg/dL — ABNORMAL HIGH (ref 70–99)
Glucose, Bld: 220 mg/dL — ABNORMAL HIGH (ref 70–99)
Potassium: 4.6 mmol/L (ref 3.5–5.1)
Potassium: 3.6 mmol/L (ref 3.5–5.1)
Potassium: 3.1 mmol/L — ABNORMAL LOW (ref 3.5–5.1)
Potassium: 3 mmol/L — ABNORMAL LOW (ref 3.5–5.1)
Sodium: 139 mmol/L (ref 135–145)
Sodium: 142 mmol/L (ref 135–145)
Sodium: 144 mmol/L (ref 135–145)
Sodium: 146 mmol/L — ABNORMAL HIGH (ref 135–145)

## 2020-12-13 LAB — LIPID PANEL
Cholesterol: 163 mg/dL (ref 0–200)
HDL: 33 mg/dL — ABNORMAL LOW (ref >40)
LDL Cholesterol: 55 mg/dL (ref 0–99)
Total CHOL/HDL Ratio: 4.9 RATIO
Triglycerides: 374 mg/dL — ABNORMAL HIGH (ref <150)
VLDL: 75 mg/dL — ABNORMAL HIGH (ref 0–40)

## 2020-12-13 LAB — CBC
HCT: 49.3 % — ABNORMAL HIGH (ref 36.0–46.0)
HCT: 45.8 % (ref 36.0–46.0)
Hemoglobin: 14.6 g/dL (ref 12.0–15.0)
Hemoglobin: 13.3 g/dL (ref 12.0–15.0)
MCH: 19.9 pg — ABNORMAL LOW (ref 26.0–34.0)
MCH: 19.6 pg — ABNORMAL LOW (ref 26.0–34.0)
MCHC: 29.6 g/dL — ABNORMAL LOW (ref 30.0–36.0)
MCHC: 29 g/dL — ABNORMAL LOW (ref 30.0–36.0)
MCV: 67.3 fL — ABNORMAL LOW (ref 80.0–100.0)
MCV: 67.6 fL — ABNORMAL LOW (ref 80.0–100.0)
Platelets: 280 10*3/uL (ref 150–400)
Platelets: 252 10*3/uL (ref 150–400)
RBC: 7.32 MIL/uL — ABNORMAL HIGH (ref 3.87–5.11)
RBC: 6.78 MIL/uL — ABNORMAL HIGH (ref 3.87–5.11)
RDW: 18.2 % — ABNORMAL HIGH (ref 11.5–15.5)
RDW: 17.8 % — ABNORMAL HIGH (ref 11.5–15.5)
WBC: 20.8 10*3/uL — ABNORMAL HIGH (ref 4.0–10.5)
WBC: 24.7 10*3/uL — ABNORMAL HIGH (ref 4.0–10.5)
nRBC: 0 % (ref 0.0–0.2)
nRBC: 0 % (ref 0.0–0.2)

## 2020-12-13 LAB — COMPREHENSIVE METABOLIC PANEL
ALT: 15 U/L (ref 0–44)
AST: 12 U/L — ABNORMAL LOW (ref 15–41)
Albumin: 3.7 g/dL (ref 3.5–5.0)
Alkaline Phosphatase: 92 U/L (ref 38–126)
BUN: 77 mg/dL — ABNORMAL HIGH (ref 6–20)
CO2: <7 mmol/L — ABNORMAL LOW (ref 22–32)
Calcium: 8.2 mg/dL — ABNORMAL LOW (ref 8.9–10.3)
Chloride: 96 mmol/L — ABNORMAL LOW (ref 98–111)
Creatinine, Ser: 2.48 mg/dL — ABNORMAL HIGH (ref 0.44–1.00)
GFR, Estimated: 22 mL/min — ABNORMAL LOW (ref >60)
Glucose, Bld: 924 mg/dL (ref 70–99)
Potassium: 4.3 mmol/L (ref 3.5–5.1)
Sodium: 135 mmol/L (ref 135–145)
Total Bilirubin: 3.7 mg/dL — ABNORMAL HIGH (ref 0.3–1.2)
Total Protein: 6.7 g/dL (ref 6.5–8.1)

## 2020-12-13 LAB — URINALYSIS, ROUTINE W REFLEX MICROSCOPIC
Bacteria, UA: NONE SEEN
Bilirubin Urine: NEGATIVE
Glucose, UA: >=500 mg/dL — AB
Ketones, ur: 80 mg/dL — AB
Leukocytes,Ua: NEGATIVE
Nitrite: NEGATIVE
Protein, ur: NEGATIVE mg/dL
Specific Gravity, Urine: 1.024 (ref 1.005–1.030)
pH: 5 (ref 5.0–8.0)

## 2020-12-13 LAB — HEMOGLOBIN A1C
Hgb A1c MFr Bld: 13.3 % — ABNORMAL HIGH (ref 4.8–5.6)
Mean Plasma Glucose: 335.01 mg/dL

## 2020-12-13 LAB — BLOOD GAS, VENOUS
Acid-base deficit: 13 mmol/L — ABNORMAL HIGH (ref 0.0–2.0)
Bicarbonate: 13.3 mmol/L — ABNORMAL LOW (ref 20.0–28.0)
Drawn by: 164
FIO2: 21
O2 Saturation: 80.5 %
Patient temperature: 37
pCO2, Ven: 34.5 mmHg — ABNORMAL LOW (ref 44.0–60.0)
pH, Ven: 7.21 — ABNORMAL LOW (ref 7.250–7.430)
pO2, Ven: 51.3 mmHg — ABNORMAL HIGH (ref 32.0–45.0)

## 2020-12-13 LAB — TROPONIN I (HIGH SENSITIVITY)
Troponin I (High Sensitivity): 14 ng/L (ref <18)
Troponin I (High Sensitivity): 51 ng/L — ABNORMAL HIGH (ref <18)

## 2020-12-13 LAB — RESP PANEL BY RT-PCR (FLU A&B, COVID) ARPGX2
Influenza A by PCR: NEGATIVE
Influenza B by PCR: NEGATIVE
SARS Coronavirus 2 by RT PCR: NEGATIVE

## 2020-12-13 LAB — I-STAT VENOUS BLOOD GAS, ED
Acid-base deficit: 19 mmol/L — ABNORMAL HIGH (ref 0.0–2.0)
Bicarbonate: 8.2 mmol/L — ABNORMAL LOW (ref 20.0–28.0)
Calcium, Ion: 1.03 mmol/L — ABNORMAL LOW (ref 1.15–1.40)
HCT: 50 % — ABNORMAL HIGH (ref 36.0–46.0)
Hemoglobin: 17 g/dL — ABNORMAL HIGH (ref 12.0–15.0)
O2 Saturation: 96 %
Potassium: 4 mmol/L (ref 3.5–5.1)
Sodium: 133 mmol/L — ABNORMAL LOW (ref 135–145)
TCO2: 9 mmol/L — ABNORMAL LOW (ref 22–32)
pCO2, Ven: 23.8 mmHg — ABNORMAL LOW (ref 44.0–60.0)
pH, Ven: 7.145 — CL (ref 7.250–7.430)
pO2, Ven: 101 mmHg — ABNORMAL HIGH (ref 32.0–45.0)

## 2020-12-13 LAB — ETHANOL: Alcohol, Ethyl (B): <10 mg/dL (ref <10)

## 2020-12-13 LAB — SALICYLATE LEVEL: Salicylate Lvl: <7 mg/dL — ABNORMAL LOW (ref 7.0–30.0)

## 2020-12-13 LAB — MAGNESIUM
Magnesium: 2.4 mg/dL (ref 1.7–2.4)
Magnesium: 2.2 mg/dL (ref 1.7–2.4)
Magnesium: 2.4 mg/dL (ref 1.7–2.4)

## 2020-12-13 LAB — I-STAT BETA HCG BLOOD, ED (MC, WL, AP ONLY): I-stat hCG, quantitative: <5 m[IU]/mL (ref <5)

## 2020-12-13 LAB — BETA-HYDROXYBUTYRIC ACID
Beta-Hydroxybutyric Acid: >8 mmol/L — ABNORMAL HIGH (ref 0.05–0.27)
Beta-Hydroxybutyric Acid: >8 mmol/L — ABNORMAL HIGH (ref 0.05–0.27)
Beta-Hydroxybutyric Acid: 4.66 mmol/L — ABNORMAL HIGH (ref 0.05–0.27)
Beta-Hydroxybutyric Acid: 1.44 mmol/L — ABNORMAL HIGH (ref 0.05–0.27)

## 2020-12-13 LAB — POC OCCULT BLOOD, ED: Fecal Occult Bld: POSITIVE — AB

## 2020-12-13 LAB — PHOSPHORUS
Phosphorus: 4.1 mg/dL (ref 2.5–4.6)
Phosphorus: 2 mg/dL — ABNORMAL LOW (ref 2.5–4.6)
Phosphorus: 2.1 mg/dL — ABNORMAL LOW (ref 2.5–4.6)

## 2020-12-13 LAB — CK: Total CK: 128 U/L (ref 38–234)

## 2020-12-13 LAB — PROTIME-INR
INR: 1.2 (ref 0.8–1.2)
Prothrombin Time: 14.8 seconds (ref 11.4–15.2)

## 2020-12-13 LAB — ACETAMINOPHEN LEVEL: Acetaminophen (Tylenol), Serum: <10 ug/mL — ABNORMAL LOW (ref 10–30)

## 2020-12-13 LAB — AMMONIA: Ammonia: 37 umol/L — ABNORMAL HIGH (ref 9–35)

## 2020-12-13 LAB — APTT: aPTT: 24 seconds (ref 24–36)

## 2020-12-13 LAB — T4, FREE: Free T4: 0.81 ng/dL (ref 0.61–1.12)

## 2020-12-13 LAB — LIPASE, BLOOD: Lipase: 24 U/L (ref 11–51)

## 2020-12-13 LAB — LACTIC ACID, PLASMA: Lactic Acid, Venous: 1.9 mmol/L (ref 0.5–1.9)

## 2020-12-13 LAB — TSH: TSH: 3.458 u[IU]/mL (ref 0.350–4.500)

## 2020-12-13 MED ORDER — POTASSIUM PHOSPHATES 15 MMOLE/5ML IV SOLN
20.0000 mmol | Freq: Once | INTRAVENOUS | Status: AC
  Administered 2020-12-13: 20 mmol via INTRAVENOUS
  Filled 2020-12-13: qty 6.67

## 2020-12-13 MED ORDER — SODIUM CHLORIDE 0.9 % IV BOLUS (SEPSIS)
1000.0000 mL | Freq: Once | INTRAVENOUS | Status: AC
  Administered 2020-12-13: 1000 mL via INTRAVENOUS

## 2020-12-13 MED ORDER — INSULIN REGULAR(HUMAN) IN NACL 100-0.9 UT/100ML-% IV SOLN
INTRAVENOUS | Status: DC
  Administered 2020-12-13: 8 [IU]/h via INTRAVENOUS
  Filled 2020-12-13: qty 100

## 2020-12-13 MED ORDER — POTASSIUM CHLORIDE 10 MEQ/100ML IV SOLN
10.0000 meq | INTRAVENOUS | Status: AC
  Administered 2020-12-13 – 2020-12-14 (×4): 10 meq via INTRAVENOUS
  Filled 2020-12-13 (×3): qty 100

## 2020-12-13 MED ORDER — IOHEXOL 300 MG/ML  SOLN
50.0000 mL | Freq: Once | INTRAMUSCULAR | Status: AC | PRN
  Administered 2020-12-13: 50 mL via INTRAVENOUS

## 2020-12-13 MED ORDER — PANTOPRAZOLE SODIUM 40 MG IV SOLR
40.0000 mg | Freq: Once | INTRAVENOUS | Status: AC
  Administered 2020-12-13: 40 mg via INTRAVENOUS
  Filled 2020-12-13: qty 40

## 2020-12-13 MED ORDER — SODIUM CHLORIDE 0.9 % IV BOLUS
1000.0000 mL | Freq: Once | INTRAVENOUS | Status: AC
  Administered 2020-12-13: 1000 mL via INTRAVENOUS

## 2020-12-13 MED ORDER — SODIUM CHLORIDE 0.9 % IV SOLN
1000.0000 mL | INTRAVENOUS | Status: DC
  Administered 2020-12-13: 1000 mL via INTRAVENOUS

## 2020-12-13 MED ORDER — PANTOPRAZOLE SODIUM 40 MG IV SOLR
40.0000 mg | Freq: Two times a day (BID) | INTRAVENOUS | Status: DC
  Administered 2020-12-13 – 2020-12-16 (×6): 40 mg via INTRAVENOUS
  Filled 2020-12-13 (×6): qty 40

## 2020-12-13 MED ORDER — INSULIN REGULAR(HUMAN) IN NACL 100-0.9 UT/100ML-% IV SOLN
INTRAVENOUS | Status: DC
  Administered 2020-12-13: 15 [IU]/h via INTRAVENOUS
  Administered 2020-12-13: 9.5 [IU]/h via INTRAVENOUS
  Filled 2020-12-13: qty 100

## 2020-12-13 MED ORDER — POTASSIUM CHLORIDE 10 MEQ/100ML IV SOLN
10.0000 meq | INTRAVENOUS | Status: DC
  Administered 2020-12-13 (×2): 10 meq via INTRAVENOUS
  Filled 2020-12-13 (×2): qty 100

## 2020-12-13 MED ORDER — INSULIN GLARGINE 100 UNIT/ML ~~LOC~~ SOLN
50.0000 [IU] | Freq: Every day | SUBCUTANEOUS | Status: DC
  Filled 2020-12-13: qty 0.5

## 2020-12-13 MED ORDER — INSULIN GLARGINE 100 UNIT/ML ~~LOC~~ SOLN
50.0000 [IU] | Freq: Every day | SUBCUTANEOUS | Status: DC
  Administered 2020-12-13 – 2020-12-14 (×2): 50 [IU] via SUBCUTANEOUS
  Filled 2020-12-13 (×3): qty 0.5

## 2020-12-13 MED ORDER — DEXTROSE 50 % IV SOLN: 0.0000 mL | INTRAVENOUS | Status: DC | PRN

## 2020-12-13 MED ORDER — SODIUM CHLORIDE 0.9 % IV SOLN
2.0000 g | Freq: Three times a day (TID) | INTRAVENOUS | Status: DC
  Administered 2020-12-13 – 2020-12-14 (×4): 2 g via INTRAVENOUS
  Filled 2020-12-13 (×6): qty 2

## 2020-12-13 MED ORDER — SODIUM CHLORIDE 0.9 % IV SOLN: INTRAVENOUS | Status: DC

## 2020-12-13 MED ORDER — VANCOMYCIN HCL 1250 MG/250ML IV SOLN
1250.0000 mg | Freq: Two times a day (BID) | INTRAVENOUS | Status: DC
  Administered 2020-12-14 (×3): 1250 mg via INTRAVENOUS
  Filled 2020-12-13 (×5): qty 250

## 2020-12-13 MED ORDER — SODIUM CHLORIDE 0.45 % IV SOLN: INTRAVENOUS | Status: DC

## 2020-12-13 MED ORDER — POTASSIUM CHLORIDE 10 MEQ/100ML IV SOLN
10.0000 meq | INTRAVENOUS | Status: AC
  Administered 2020-12-13 (×2): 10 meq via INTRAVENOUS
  Filled 2020-12-13 (×2): qty 100

## 2020-12-13 MED ORDER — VANCOMYCIN HCL 2000 MG/400ML IV SOLN
2000.0000 mg | Freq: Once | INTRAVENOUS | Status: AC
  Administered 2020-12-13: 2000 mg via INTRAVENOUS
  Filled 2020-12-13: qty 400

## 2020-12-13 MED ORDER — PANTOPRAZOLE SODIUM 40 MG IV SOLR: 40.0000 mg | INTRAVENOUS | Status: DC

## 2020-12-13 MED ORDER — DEXTROSE IN LACTATED RINGERS 5 % IV SOLN: INTRAVENOUS | Status: DC

## 2020-12-13 MED ORDER — ONDANSETRON HCL 4 MG/2ML IJ SOLN
4.0000 mg | Freq: Once | INTRAMUSCULAR | Status: AC
  Administered 2020-12-13: 4 mg via INTRAVENOUS
  Filled 2020-12-13: qty 2

## 2020-12-13 NOTE — Progress Notes (Signed)
Reached out to CCM for worsening mental status. Will kindly see and evaluate patient. FPTS appreciates any recommendations with management.    Carollee Leitz, MD Family Medicine Residency

## 2020-12-13 NOTE — Progress Notes (Signed)
FPTS Interim Progress Note  S: Went to see patient for PM rounding. She is more alert and answering questions. Per RN, wanting to eat.   O: BP (!) 107/92 (BP Location: Left Arm)   Pulse (!) 116   Temp 97.6 F (36.4 C) (Oral)   Resp 16   Ht 5\' 3"  (1.6 m)   Wt 108.9 kg   LMP 08/03/2013   SpO2 94%   BMI 42.53 kg/m   General: non toxic  Mouth:poor dentition with brown plaque covering tongue  Neuro: alert and oriented to self, place, time.  A/P: Improving mental status and hyperglycemia.  Wilber Oliphant, MD 12/13/2020, 10:47 PM PGY-3, [place] Service pager [phone]

## 2020-12-13 NOTE — ED Notes (Signed)
Attempted report without success. Left call back number [phone]

## 2020-12-13 NOTE — ED Notes (Signed)
Report received from [employer]. Pt currently unavailable for assessment pt in CT scan. IV insulin discontinued by previous RN per orders from Dr. Thompson Grayer.

## 2020-12-13 NOTE — Progress Notes (Signed)
Unable to gather admission history, pt is disoriented no family at bedside

## 2020-12-13 NOTE — ED Notes (Signed)
Dr. Leonette Monarch notified of patients critical glucose of 924 at this time.

## 2020-12-13 NOTE — Progress Notes (Signed)
FPTS Interim Progress Note  S:Went to assess patient. No significant changes since admission.  Continues to remain altered.  Responds to verbal and sternal rub.  Intermittently obeys commands.  Speech is unclear.    O: BP 110/74   Pulse (!) 111   Temp 98.7 F (37.1 C) (Oral)   Resp 19   Ht 5\' 3"  (1.6 m)   Wt 108.9 kg   LMP 08/03/2013   SpO2 96%   BMI 42.53 kg/m    Physical Exam:   General: 55 y.o. female in NAD Cardio: RRR no m/r/g Lungs: CTAB, no crackles or wheezing appreciated Abdomen: Soft, round non-tender to palpation, non-distended, positive bowel sounds Skin: warm and dry Extremities: No edema Neuro: Responds to voice and sternal rub.  Pupils sluggish bilaterally.    A/P:  DKA Continue Insulin drip Continue IVF  NPO Monitor Intake BMP q4h Replete electrolytes  CCM evaluated, no indication for ICU status and will sign off.  I appreciate CCM team for quick response and feedback.   Carollee Leitz, MD 12/13/2020, 3:20 PM PGY-2, [facility] Medicine Service pager [phone]

## 2020-12-13 NOTE — Progress Notes (Signed)
Admission from the ED stretcher lethargic but arousable.[name]

## 2020-12-13 NOTE — Progress Notes (Signed)
Pharmacy Antibiotic Note  Morgan [name] is a 55 y.o. female admitted on 12/13/2020 with sepsis.  Pharmacy has been consulted for Aztreonam and vancomycin dosing.  WBC elevated at 22. SCr low. AF   Plan: -Aztreonam 2 gm IV Q 8 hours -Vancomycin 2 gm IV load followed by Vancomycin 1250 mg IV Q 12 hrs. Goal AUC 400-550. Expected AUC: 502.3 SCr used: 0.7 -Monitor CBC, renal fx, cultures and clinical progress -Vanc levels at steady state    Height: 5\' 3"  (160 cm) Weight: 108.9 kg (240 lb 1.3 oz) IBW/kg (Calculated) : 52.4  Temp (24hrs), Avg:97.9 F (36.6 C), Min:97.9 F (36.6 C), Max:97.9 F (36.6 C)  Recent Labs  Lab 12/13/20 0412 12/13/20 0424 12/13/20 0500 12/13/20 0709 12/13/20 0915 12/13/20 0925 12/13/20 0947  WBC 20.8*  --   --   --  DUP SEE [id number] 22.6*  --   CREATININE 2.48*  --  1.30* 1.95*  --   --  0.40*  LATICACIDVEN  --  1.9  --   --   --   --   --     Estimated Creatinine Clearance: 93 mL/min (A) (by C-G formula based on SCr of 0.4 mg/dL (L)).    Allergies  Allergen Reactions  . Ilosone [Erythromycin] Anaphylaxis  . Keflex [Cephalexin] Anaphylaxis  . Penicillins Anaphylaxis    Has patient had a PCN reaction causing immediate rash, facial/tongue/throat swelling, SOB or lightheadedness with hypotension: YES Has patient had a PCN reaction causing severe rash involving mucus membranes or skin necrosis: NO Has patient had a PCN reaction that required hospitalizationNO Has patient had a PCN reaction occurring within the last 10 years: NO If all of the above answers are "NO", then may proceed with Cephalosporin use.  . Iron Other (See Comments)    Pt reports condition limiting Iron intake.  . Metformin And Related Other (See Comments)    UNSPECIFIED REACTION  Pt prefers to not take this medication Side effects  . Morphine And Related Other (See Comments)    Pt reports hallucinations    Antimicrobials this admission: Aztreonam 5/14 >>  Vanc 5/14 >>    Dose adjustments this admission:   Microbiology results: 5/14 BCx:    Thank you for allowing pharmacy to be a part of this patient's care.  Albertina Parr, PharmD., BCPS, BCCCP Clinical Pharmacist Please refer to [facility] for unit-specific pharmacist

## 2020-12-13 NOTE — ED Triage Notes (Signed)
Patient arrives from home with [facility], son called saying she was last seen normal on Thursday, has been dehydrated and vomiting, EMS reports tarry stool, blood in her emesis, CBG for EMS 435, A+OX2, disoriented to time and situation    110/76 110 HR 96% RA 22 L FA

## 2020-12-13 NOTE — Consult Note (Signed)
NAME:  Morgan [name], MRN:  [id number], DOB:  09/12/1965, LOS: 0 ADMISSION DATE:  12/13/2020, CONSULTATION DATE: 12/13/2020 REFERRING MD: Dr. Volanda Napoleon, CHIEF COMPLAINT: DKA with significant metabolic acidosis  History of Present Illness:  Morgan [name] is a 55 year old female with very extensive  past medical history significant for but not limited to type 2 diabetes subclavian steal syndrome, stroke, shortness of breath, prior NSTEMI, hypertension, hyperlipidemia, hypothyroidism, prior DKA in the setting of type 2 diabetes, bipolar disease, and anxiety who presented to the emergency department after son found patient altered with appearance of dehydration and recent vomiting.  EMS reports tarry stool and blood within emesis at scene.    To emergency department patient was seen mildly tachypneic, mildly tachycardic but all other vital signs within normal limits.  Pertinent lab work on arrival revealed chloride 96, CO2 on be met less than 7, glucose 924, BUN 77, creatinine 2.48, AST 12, WBC 20.8, hemoglobin 14.6.  VBG with pH 7.14, CO2 23, PO2 101, bicarb 9.  Patient was admitted by family practice teaching service for DKA and concern for intra-abdominal process with possible GI bleed.  When CO2 and mentation did not improve F PTS consulted PCCM for further assistance.   Pertinent  Medical History  Very extensive  past medical history significant for but not limited to: Type 2 diabetes Subclavian steal syndrome Stroke Shortness of breath Prior NSTEMI Hypertension Hyperlipidemia Hypothyroidism Prior DKA in the setting of type 2 diabetes Bipolar disease Anxiety   Significant Hospital Events: Including procedures, antibiotic start and stop dates in addition to other pertinent events   . 5/14 admitted for DKA and possible intra-abdominal process including GI bleed  Interim History / Subjective:  As above  Objective   Blood pressure 138/83, pulse (!) 111, temperature 97.9 F (36.6 C),  temperature source Oral, resp. rate 15, height _0  (1.6 m), weight 108.9 kg, last menstrual period 08/03/2013, SpO2 96 %.        Intake/Output Summary (Last 24 hours) at 12/13/2020 1150 Last data filed at 12/13/2020 5027 Gross per 24 hour  Intake 2100 ml  Output --  Net 2100 ml   Filed Weights   12/13/20 0411  Weight: 108.9 kg    Examination: General: Acute on chronic ill-appearing middle-aged female, lying on ED stretcher in no acute distress HEENT: [place]/AT, MM pink/moist, PERRL, oral mucous membranes very dry with dark brown dried secretions concerning for old blood Neuro: Initially minimally responsive to assessment but on painful stimuli patient quickly responded appropriately and began interacting with the interviewer, she is alert and oriented x2, nonfocal CV: s1s2 regular rate and rhythm, no murmur, rubs, or gallops,  PULM: Clear to auscultation bilaterally, no increased work of breathing, oxygen saturations appropriate on room air GI: soft, bowel sounds hypo-active in all 4 quadrants, slight tenderness to palpation of right upper quadrant, non-distended Extremities: warm/dry, no edema  Skin: no rashes or lesions  Labs/imaging that I have personally reviewed    CT abdomen pelvis 5/14 with no signs of intra-abdominal or pelvic abscesses, cannot exclude generalized pancolitis  Resolved Hospital Problem list     Assessment & Plan:  Diabetic ketoacidosis  -pH 7.145, CO2 28.3, Anion Gap unable to be calculated due to significantly low CO2 of less than 7 History of uncontrolled type 2 diabetes -Hemoglobin A1c 11.9 07/07/2020 P: Aggressive IV hydration provided on admission Insulin drip initially started but stopped her after FPTS when i-STAT potassium resulted at 2.3 Patient has received potassium supplementation will  resume a IV insulin drip now Patient is not grossly altered therefore no acute indications for bicarb drip at this time Need repeat be met and to trend every 4  hours Supplement potassium as needed Check CBG every 1 hour Once blood glucose falls below 250 start patient on D5 IV fluids When I anion gap closes and patient is able to tolerate oral diet transition to subcu long-acting insulin in slowly turned drip off over the next 1-2 hours Monitor renal function Lactic acid 1.9 Currently patient is stable to remain under primary care of F PTS and admitted to progressive care  Concern for underlying sepsis with unknown etiology -Patient met criteria for sepsis on arrival tachycardia, tachypnea, leukocytosis, and acute altered mental status -Patient's moderate DKA could be contributing to several sepsis criteria but unable to fully rule out sepsis at this time P: Empiric antibiotics Panculture Received aggressive IV hydration on arrival Lactic within normal limits at 1.9 Consider obtaining procalcitonin CT abdomen and pelvis pancolitis  Concern for GI bleed -On EMS arrival patient was seen with hematic emesis and melena -Hemoglobin stable P: PPI  Consider GI consult Continue IV hydration Trend CBC Transfuse per protocol Hemoccult stool  Acute metabolic encephalopathy -DKA significant metabolic derangements History of bipolar disease P: Treat underlying cause  Minimize sedation Delirium precautions  Acute Kidney Injury -Presented with creatinine 2.48, BUN 77, GFR 22 likely in the setting of dehydration from DKA P: Follow renal function / urine output Trend Bmet Avoid nephrotoxins Ensure adequate renal perfusion  Continue IV hydration  Hypernatremia Hypokalemia P: Supplement as needed Closely trend to be met  Hypothyroidism P: Home Synthroid Obtain TSH     Rest of acute and chronic medical conditions managed per primary team  PCCM will be available as needed please call if any additional assistance is needed   Best practice   Diet:  NPO Pain/Anxiety/Delirium protocol (if indicated): No VAP protocol (if  indicated): Not indicated DVT prophylaxis: Contraindicated GI prophylaxis: PPI Glucose control:  Insulin gtt Central venous access:  N/A Arterial line:  N/A Foley:  N/A Mobility:  OOB  PT consulted: Yes Last date of multidisciplinary goals of care discussion Per primary Code Status:  full code Disposition: Progressive  Labs   CBC: Recent Labs  Lab 12/13/20 0412 12/13/20 0500 12/13/20 0915 12/13/20 0925 12/13/20 0947  WBC 20.8*  --  DUP SEE S51500 22.6*  --   NEUTROABS  --   --  PENDING 19.4*  --   HGB 14.6 17.0*  16.7* DUP SEE S51500 13.4 9.9*  HCT 49.3* 50.0*  49.0* DUP SEE S51500 46.3* 29.0*  MCV 67.3*  --  DUP SEE S51500 68.1*  --   PLT 280  --  DUP SEE S51500 248  --     Basic Metabolic Panel: Recent Labs  Lab 12/13/20 0412 12/13/20 0500 12/13/20 0709 12/13/20 0915 12/13/20 0947  NA 135 133*  134* 139  --  148*  K 4.3 4.0  4.1 4.6  --  2.3*  CL 96* 104 109  --  123*  CO2 <7*  --  <7*  --   --   GLUCOSE 924* >700* 641*  --  385*  BUN 77* 64* 65*  --  38*  CREATININE 2.48* 1.30* 1.95*  --  0.40*  CALCIUM 8.2*  --  7.3*  --   --   MG  --   --   --  2.4  --   PHOS  --   --   --  4.1  --    GFR: Estimated Creatinine Clearance: 93 mL/min (A) (by C-G formula based on SCr of 0.4 mg/dL (L)). Recent Labs  Lab 12/13/20 0412 12/13/20 0424 12/13/20 0915 12/13/20 0925  WBC 20.8*  --  DUP SEE S51500 22.6*  LATICACIDVEN  --  1.9  --   --     Liver Function Tests: Recent Labs  Lab 12/13/20 0412  AST 12*  ALT 15  ALKPHOS 92  BILITOT 3.7*  PROT 6.7  ALBUMIN 3.7   Recent Labs  Lab 12/13/20 0412  LIPASE 24   Recent Labs  Lab 12/13/20 0538  AMMONIA 37*    ABG    Component Value Date/Time   HCO3 8.2 (L) 12/13/2020 0500   TCO2 8 (L) 12/13/2020 0947   ACIDBASEDEF 19.0 (H) 12/13/2020 0500   O2SAT 96.0 12/13/2020 0500     Coagulation Profile: Recent Labs  Lab 12/13/20 0425  INR 1.2    Cardiac Enzymes: No results for input(s): CKTOTAL,  CKMB, CKMBINDEX, TROPONINI in the last 168 hours.  HbA1C: Hemoglobin A1C  Date/Time Value Ref Range Status  07/07/2020 03:20 PM 11.9 (A) 4.0 - 5.6 % Final   HbA1c, POC (controlled diabetic range)  Date/Time Value Ref Range Status  04/03/2020 02:05 PM 14.7 (A) 0.0 - 7.0 % Final   Hgb A1c MFr Bld  Date/Time Value Ref Range Status  11/24/2019 10:28 PM 14.5 (H) 4.8 - 5.6 % Final    Comment:    (NOTE) Pre diabetes:          5.7%-6.4% Diabetes:              >6.4% Glycemic control for   <7.0% adults with diabetes   07/30/2019 06:30 AM 14.2 (H) 4.8 - 5.6 % Final    Comment:    (NOTE) Pre diabetes:          5.7%-6.4% Diabetes:              >6.4% Glycemic control for   <7.0% adults with diabetes     CBG: Recent Labs  Lab 12/13/20 0642 12/13/20 0756 12/13/20 0858 12/13/20 0946 12/13/20 1039  GLUCAP 597* 523* 569* 474* 407*    Review of Systems:   Please see the history of present illness. All other systems reviewed and are negative   Past Medical History:  She,  has a past medical history of Acute systolic heart failure ([place]), Anxiety, Asthma, Bipolar depression ([place]), Carpal tunnel syndrome, bilateral (09/07/2019), Chronic bronchitis ([place]), Chronic esophagogastric ulcer, Chronic stomach ulcer, Diabetic ketoacidosis without coma associated with type 2 diabetes mellitus ([place]) (05/12/2019), Diarrhea (12/25/2019), Dysequilibrium (04/03/2020), Elevated transaminase level (07/21/2018), Encounter for screening colonoscopy (04/03/2020), Encounter for screening mammogram for malignant neoplasm of breast (04/03/2020), Esophagitis determined by endoscopy, Extravasation accident, initial encounter (07/21/2018), Fibroid, GERD (gastroesophageal reflux disease), Heart murmur, Hepatic steatosis, History of blood transfusion (2010), History of hiatal hernia, Hyperlipidemia, Hypertension, Hypothyroid, LFTs abnormal, Migraine, Nausea (08/09/2018), Need for immunization against influenza (04/03/2020), Non-ST  elevation (NSTEMI) myocardial infarction ([place]) (10/27/2018), 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   Surgical History:   Past Surgical History:  Procedure Laterality Date  . ABDOMINAL HYSTERECTOMY  2015   "still have 1 ovary"  . AORTIC ARCH ANGIOGRAPHY  06/14/2019  . AORTIC ARCH ANGIOGRAPHY N/A 06/14/2019   Procedure:  AORTIC ARCH ANGIOGRAPHY;  Surgeon: Lorretta Harp, MD;  Location: [place] [facility];  Service: Cardiovascular;  Laterality: N/A;  . BIOPSY  07/25/2018   Procedure: BIOPSY;  Surgeon: Thornton Park, MD;  Location[facility];  Service: Gastroenterology;;  . CAROTID-SUBCLAVIAN BYPASS GRAFT Left 07/30/2019   Procedure: LEFT CAROTID-SUBCLAVIAN BYPASS GRAFT;  Surgeon: Marty Heck, MD;  Location: [place];  Service: Vascular;  Laterality: Left;  . CESAREAN SECTION  1988; 1996  . CORONARY STENT INTERVENTION N/A 10/27/2018   Procedure: CORONARY STENT INTERVENTION;  Surgeon: Sherren Mocha, MD;  Location: [place] [facility];  Service: Cardiovascular;  Laterality: N/A;  . ESOPHAGOGASTRODUODENOSCOPY (EGD) WITH PROPOFOL N/A 07/25/2018   Procedure: ESOPHAGOGASTRODUODENOSCOPY (EGD) WITH PROPOFOL;  Surgeon: Thornton Park, MD;  Location: [place];  Service: Gastroenterology;  Laterality: N/A;  . KNEE RECONSTRUCTION Left 1980  . LAPAROSCOPIC CHOLECYSTECTOMY    . LEFT HEART CATH AND CORONARY ANGIOGRAPHY N/A 10/27/2018   Procedure: LEFT HEART CATH AND CORONARY ANGIOGRAPHY;  Surgeon: Sherren Mocha, MD;  Location: [place] [facility];  Service: Cardiovascular;  Laterality: N/A;  . SUBCLAVIAN STENT PLACEMENT Left 2010  . TONSILLECTOMY  1970  . UPPER EXTREMITY ANGIOGRAPHY Right 06/14/2019   Procedure: UPPER EXTREMITY  ANGIOGRAPHY;  Surgeon: Lorretta Harp, MD;  Location: [place] [facility];  Service: Cardiovascular;  Laterality: Right;  upper ext     Social History:   reports that she quit smoking about 6 years ago. Her smoking use included cigarettes. She has a 72.00 pack-year smoking history. She has never used smokeless tobacco. She reports previous alcohol use. She reports previous drug use.   Family History:  Her family history includes CAD in her father and mother; Cervical cancer in her mother; Diabetes in her mother; Heart failure in her father; Hyperlipidemia in her brother, mother, and sister; Hypertension in her brother, mother, and sister; Testicular cancer in her father.   Allergies Allergies  Allergen Reactions  . Ilosone [Erythromycin] Anaphylaxis  . Keflex [Cephalexin] Anaphylaxis  . Penicillins Anaphylaxis    Has patient had a PCN reaction causing immediate rash, facial/tongue/throat swelling, SOB or lightheadedness with hypotension: YES Has patient had a PCN reaction causing severe rash involving mucus membranes or skin necrosis: NO Has patient had a PCN reaction that required hospitalizationNO Has patient had a PCN reaction occurring within the last 10 years: NO If all of the above answers are "NO", then may proceed with Cephalosporin use.  . Iron Other (See Comments)    Pt reports condition limiting Iron intake.  . Metformin And Related Other (See Comments)    UNSPECIFIED REACTION  Pt prefers to not take this medication Side effects  . Morphine And Related Other (See Comments)    Pt reports hallucinations     Home Medications  Prior to Admission medications   Medication Sig Start Date End Date Taking? Authorizing Provider  albuterol (VENTOLIN HFA) 108 (90 Base) MCG/ACT inhaler Inhale 2-4 puffs into the lungs every 4 (four) hours as needed for wheezing (or cough). 10/16/20   [name], DO  ARIPiprazole (ABILIFY) 5 MG tablet Take 1 tablet (5 mg total) by mouth daily.  10/16/20   [name], DO  aspirin 81 MG tablet Take 1 tablet (81 mg total) by mouth daily. 10/31/18   Regalado, Belkys A, MD  atorvastatin (LIPITOR) 80 MG tablet Take 1 tablet (80 mg total) by mouth daily. 10/16/20   [name], DO  Cholecalciferol (VITAMIN D3) 25 MCG (1000 UT)  CAPS Take 1,000-2,000 Units by mouth See admin instructions. Take 2 tablets by mouth once a day on Mon/Wed/Fri and take 1 tablet on Sun/Tues/Thurs/Sat    [provider]  Continuous Blood Gluc Receiver (FREESTYLE LIBRE 2 READER) DEVI 1 each by Does not apply route 6 (six) times daily. 09/10/20   [name], DO  Continuous Blood Gluc Sensor (FREESTYLE LIBRE 2 SENSOR) MISC 1 each by Does not apply route 6 (six) times daily. 09/10/20   [name], DO  diazepam (VALIUM) 2 MG tablet Take 1 tablet (2 mg total) by mouth every 12 (twelve) hours as needed for up to 6 doses (vertigo). 11/21/20   Arnaldo Natal, MD  empagliflozin (JARDIANCE) 25 MG TABS tablet Take 1 tablet (25 mg total) by mouth daily. 09/01/20   Simmons-Robinson, Riki Sheer, MD  EPINEPHrine 0.3 mg/0.3 mL IJ SOAJ injection Inject 0.3 mg into the muscle once as needed for anaphylaxis (for an anaphylactic reaction). 04/24/20   Leavy Cella, RPH-CPP  fluconazole (DIFLUCAN) 150 MG tablet Take 1 tablet today by mouth and one tablet tomorrow by mouth. 08/25/20   Gladys Damme, MD  fluticasone-salmeterol (ADVAIR HFA) [phone] MCG/ACT inhaler USE 2 INHALATIONS BY MOUTH  TWICE DAILY 10/16/20   [name], DO  furosemide (LASIX) 20 MG tablet Take 1 tablet (20 mg total) by mouth 2 (two) times daily. 12/03/20   [name], DO  Glucagon, rDNA, (GLUCAGON EMERGENCY) 1 MG KIT Inject 1 mg into the vein once as needed (FOR ONSET OF HYPOGLYCEMIA). 02/13/20   [name], DO  glucose blood (TRUE METRIX BLOOD GLUCOSE TEST) test strip Use as instructed 09/01/20   Simmons-Robinson, Mak[place], MD  Insulin Glargine (BASAGLAR KWIKPEN) 100 UNIT/ML  Inject 55 Units into the skin 2 (two) times daily. 11/06/20   [name], DO  insulin lispro (HUMALOG KWIKPEN) 100 UNIT/ML KwikPen Inject 4-10 Units into the skin 4 (four) times daily. 4 with coffee and 10 with meals 09/26/20   Milus Banister C, DO  Insulin Pen Needle (PEN NEEDLES) 32G X 4 MM MISC 1 Device by Does not apply route 6 (six) times daily. 11/11/20   [name], DO  lamoTRIgine (LAMICTAL) 200 MG tablet Take 1 tablet (200 mg total) by mouth daily. 10/16/20   [name], DO  levothyroxine (SYNTHROID) 150 MCG tablet Take 1 tablet (150 mcg total) by mouth at bedtime. 3:30 Am 10/16/20   [name], DO  losartan (COZAAR) 100 MG tablet Take 1 tablet (100 mg total) by mouth daily. 10/16/20   [name], DO  metoprolol tartrate (LOPRESSOR) 50 MG tablet Take 1 tablet (50 mg total) by mouth 2 (two) times daily. 08/19/20   Leavy Cella, RPH-CPP  Multiple Vitamin (MULTIVITAMIN WITH MINERALS) TABS tablet Take 1 tablet by mouth daily. Woman 55 +    [provider]  nitroGLYCERIN (NITROSTAT) 0.4 MG SL tablet Place 1 tablet (0.4 mg total) under the tongue every 5 (five) minutes x 3 doses as needed for chest pain. 04/24/20   Leavy Cella, RPH-CPP  nystatin (MYCOSTATIN/NYSTOP) powder Apply 1 application topically daily as needed (infection). 02/13/20   [name], DO  nystatin ointment (MYCOSTATIN) Apply 1 application topically daily as needed (infection). 02/13/20   [name], DO  pantoprazole (PROTONIX) 40 MG tablet Take 1 tablet (40 mg total) by mouth 2 (two) times daily before a meal. X 8 weeks Patient taking differently: Take 40 mg by mouth 2 (  two) times daily. 07/26/18   Rai, Ripudeep K, MD  potassium chloride SA (KLOR-CON) 20 MEQ tablet Take 1 tablet (20 mEq total) by mouth daily. 07/09/20   Lilland, Alana, DO  SUMAtriptan (IMITREX) 25 MG tablet  TAKE 1 TABLET BY MOUTH EVERY 2 HOURS AS NEEDED FOR MIGRAINE. MAY REPEAT IN 2 HOURS IF HEADACHE  PERSISTS OR RECURS. PLEASE TAKE AS SOON AS YOU FEEL YOUR MIGRAINE STARTING.  Patient taking differently: Take 25 mg by mouth every 2 (two) hours as needed for migraine or headache. May repeat in 2 hours. 12/27/19   Shirley, [place], DO  topiramate (TOPAMAX) 25 MG tablet Take 1 tablet (25 mg total) by mouth at bedtime. Take with 79m tablet for a total of 755m 10/16/20   AnDaisy FloroDO  topiramate (TOPAMAX) 50 MG tablet Take 1 tablet (50 mg total) by mouth at bedtime. Take with 2512mablet for a total of 52m61m/17/22   AndeDaisy Floro  traZODone (DESYREL) 50 MG tablet Take 50 mg by mouth at bedtime.  04/02/20   [provider]  venlafaxine XR (EFFEXOR-XR) 150 MG 24 hr capsule Take 1 capsule (150 mg total) by mouth daily. 09/08/20   AndeDaisy Floro     Signature:   WhitJohnsie Cancel-C [facility] Personal contact information can be found on Amion  12/13/2020, 12:20 PM

## 2020-12-13 NOTE — ED Notes (Addendum)
stopped per dr. pray due to low potassium. Patient to receive four infusions of iv potassium and bmp after all potassium infusions, per dr. Thompson Grayer.

## 2020-12-13 NOTE — Progress Notes (Signed)
Bladder scan done-341 ml.

## 2020-12-13 NOTE — ED Provider Notes (Signed)
[facility] EMERGENCY DEPARTMENT Provider Note  CSN: [id number] Arrival date & time: 12/13/20 0400  Chief Complaint(s) Hyperglycemia and Altered Mental Status ED Triage Notes Phill Myron, RN (Registered Nurse) . [name] . [name] Date of Service: 12/13/2020 4:07 AM . . Signed   Patient arrives from home with [facility], son called saying she was last seen normal on Thursday, has been dehydrated and vomiting, EMS reports tarry stool, blood in her emesis, CBG for EMS 435, A+OX2, disoriented to time and situation         HPI Morgan [name] is a 55 y.o. female with a past medical history listed below who presents to the emergency department for altered mental status.  Remainder of history, ROS, and physical exam limited due to patient's condition (AMS). Additional information was obtained from EMS.   Level V Caveat.    HPI  Past Medical History Past Medical History:  Diagnosis Date  . Acute systolic heart failure ([place])   . Anxiety   . Asthma   . Bipolar depression ([place])   . Carpal tunnel syndrome, bilateral 09/07/2019  . Chronic bronchitis ([place])   . Chronic esophagogastric ulcer   . Chronic stomach ulcer   . Diabetic ketoacidosis without coma associated with type 2 diabetes mellitus ([place]) 05/12/2019  . Diarrhea 12/25/2019  . Dysequilibrium 04/03/2020  . Elevated transaminase level 07/21/2018  . Encounter for screening colonoscopy 04/03/2020  . Encounter for screening mammogram for malignant neoplasm of breast 04/03/2020  . Esophagitis determined by endoscopy   . Extravasation accident, initial encounter 07/21/2018  . Fibroid   . GERD (gastroesophageal reflux disease)   . Heart murmur   . Hepatic steatosis   . History of blood transfusion 2010   "related to subclavian stent"  . History of hiatal hernia   . Hyperlipidemia   . Hypertension   . Hypothyroid   . LFTs abnormal   . Migraine    "cerebral migraines; 1-2/month" (06/29/2018)  . Nausea  08/09/2018  . Need for immunization against influenza 04/03/2020  . Non-ST elevation (NSTEMI) myocardial infarction ([place]) 10/27/2018  . 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 SOB (shortness of breath)   . 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 Stenosis of left subclavian artery ([place]) 06/05/2019   Left subclavian artery stenosis. left upper extremity claudication and subclavian steal symptoms. Carotid Dopplers performed 04/17/2019 with suggestion of occluded left subclavian artery with retrograde vertebral filling and subsequent CT angiogram performed 04/27/2019 with confirmation -Admitted for elective PV angiogram in which recommendations were for a left common carotid to subclavian bypass.  . Stroke ([place])    "I've had 2; most recent one was ~ 2015 or before; affected balance" (06/29/2018)  . 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 Thalassemia minor   . Type II diabetes mellitus ([place])   . Vitamin D deficiency    Patient Active Problem List   Diagnosis Date Noted  . Beta thalassemia minor 09/30/2020  . Microcytosis 09/18/2020  . Leg swelling 09/10/2020  . Oral pharyngeal candidiasis 08/31/2020  . Chronic left shoulder pain 08/17/2020  . Hyperglycemia 07/08/2020  . Type 2 diabetes mellitus with hyperglycemia ([place]) 07/08/2020  . No-show for appointment 05/12/2020  . Cervical spinal stenosis 10/09/2019  . Status post carotid endarterectomy 07/30/2019  . Subclavian artery stenosis, left ([place]) 07/30/2019  . Migraines 07/16/2019  . Vitamin D deficiency 05/19/2019  . Hyperglycemia due to diabetes mellitus ([place]) 11/17/2018  . S/P coronary artery stent placement   .  Hyperlipidemia LDL goal <70   . Hepatic steatosis   . Mild renal insufficiency 07/21/2018  . Leukocytosis 07/21/2018  . Bipolar depression ([place]) 07/21/2018  . Incidental lung nodule 07/21/2018  . Prolonged QT interval 07/21/2018  . Hyperlipidemia 05/14/2016  . Hypothyroidism 05/14/2016  . Essential hypertension  05/14/2016   Home Medication(s) Prior to Admission medications   Medication Sig Start Date End Date Taking? Authorizing Provider  albuterol (VENTOLIN HFA) 108 (90 Base) MCG/ACT inhaler Inhale 2-4 puffs into the lungs every 4 (four) hours as needed for wheezing (or cough). 10/16/20   [name], DO  ARIPiprazole (ABILIFY) 5 MG tablet Take 1 tablet (5 mg total) by mouth daily. 10/16/20   [name], DO  aspirin 81 MG tablet Take 1 tablet (81 mg total) by mouth daily. 10/31/18   Regalado, Belkys A, MD  atorvastatin (LIPITOR) 80 MG tablet Take 1 tablet (80 mg total) by mouth daily. 10/16/20   [name], DO  Cholecalciferol (VITAMIN D3) 25 MCG (1000 UT) CAPS Take 1,000-2,000 Units by mouth See admin instructions. Take 2 tablets by mouth once a day on Mon/Wed/Fri and take 1 tablet on Sun/Tues/Thurs/Sat    [provider]  Continuous Blood Gluc Receiver (FREESTYLE LIBRE 2 READER) DEVI 1 each by Does not apply route 6 (six) times daily. 09/10/20   [name], DO  Continuous Blood Gluc Sensor (FREESTYLE LIBRE 2 SENSOR) MISC 1 each by Does not apply route 6 (six) times daily. 09/10/20   [name], DO  diazepam (VALIUM) 2 MG tablet Take 1 tablet (2 mg total) by mouth every 12 (twelve) hours as needed for up to 6 doses (vertigo). 11/21/20   Arnaldo Natal, MD  empagliflozin (JARDIANCE) 25 MG TABS tablet Take 1 tablet (25 mg total) by mouth daily. 09/01/20   Simmons-Robinson, Riki Sheer, MD  EPINEPHrine 0.3 mg/0.3 mL IJ SOAJ injection Inject 0.3 mg into the muscle once as needed for anaphylaxis (for an anaphylactic reaction). 04/24/20   Leavy Cella, RPH-CPP  fluconazole (DIFLUCAN) 150 MG tablet Take 1 tablet today by mouth and one tablet tomorrow by mouth. 08/25/20   Gladys Damme, MD  fluticasone-salmeterol (ADVAIR HFA) [id number] MCG/ACT inhaler USE 2 INHALATIONS BY MOUTH  TWICE DAILY 10/16/20   [name], DO  furosemide (LASIX) 20 MG tablet Take 1 tablet (20 mg  total) by mouth 2 (two) times daily. 12/03/20   [name], DO  Glucagon, rDNA, (GLUCAGON EMERGENCY) 1 MG KIT Inject 1 mg into the vein once as needed (FOR ONSET OF HYPOGLYCEMIA). 02/13/20   [name], DO  glucose blood (TRUE METRIX BLOOD GLUCOSE TEST) test strip Use as instructed 09/01/20   Simmons-Robinson, [place], MD  Insulin Glargine (BASAGLAR KWIKPEN) 100 UNIT/ML Inject 55 Units into the skin 2 (two) times daily. 11/06/20   [name], DO  insulin lispro (HUMALOG KWIKPEN) 100 UNIT/ML KwikPen Inject 4-10 Units into the skin 4 (four) times daily. 4 with coffee and 10 with meals 09/26/20   Milus Banister C, DO  Insulin Pen Needle (PEN NEEDLES) 32G X 4 MM MISC 1 Device by Does not apply route 6 (six) times daily. 11/11/20   [name], DO  lamoTRIgine (LAMICTAL) 200 MG tablet Take 1 tablet (200 mg total) by mouth daily. 10/16/20   [name], DO  levothyroxine (SYNTHROID) 150 MCG tablet Take 1 tablet (150 mcg total) by mouth at bedtime. 3:30 Am 10/16/20   [name], DO  losartan (COZAAR) 100 MG tablet Take 1 tablet (100 mg total) by mouth daily. 10/16/20   [name], DO  metoprolol tartrate (LOPRESSOR) 50 MG tablet Take 1 tablet (50 mg total) by mouth 2 (two) times daily. 08/19/20   Leavy Cella, RPH-CPP  Multiple Vitamin (MULTIVITAMIN WITH MINERALS) TABS tablet Take 1 tablet by mouth daily. Woman 41 +    [provider]  nitroGLYCERIN (NITROSTAT) 0.4 MG SL tablet Place 1 tablet (0.4 mg total) under the tongue every 5 (five) minutes x 3 doses as needed for chest pain. 04/24/20   Leavy Cella, RPH-CPP  nystatin (MYCOSTATIN/NYSTOP) powder Apply 1 application topically daily as needed (infection). 02/13/20   [name], DO  nystatin ointment (MYCOSTATIN) Apply 1 application topically daily as needed (infection). 02/13/20   [name], DO  pantoprazole (PROTONIX) 40 MG tablet Take 1 tablet (40 mg total) by mouth 2 (two) times  daily before a meal. X 8 weeks Patient taking differently: Take 40 mg by mouth 2 (two) times daily. 07/26/18   Rai, Ripudeep K, MD  potassium chloride SA (KLOR-CON) 20 MEQ tablet Take 1 tablet (20 mEq total) by mouth daily. 07/09/20   Lilland, Alana, DO  SUMAtriptan (IMITREX) 25 MG tablet  TAKE 1 TABLET BY MOUTH EVERY 2 HOURS AS NEEDED FOR MIGRAINE. MAY REPEAT IN 2 HOURS IF HEADACHE PERSISTS OR RECURS. PLEASE TAKE AS SOON AS YOU FEEL YOUR MIGRAINE STARTING.  Patient taking differently: Take 25 mg by mouth every 2 (two) hours as needed for migraine or headache. May repeat in 2 hours. 12/27/19   Shirley, [place], DO  topiramate (TOPAMAX) 25 MG tablet Take 1 tablet (25 mg total) by mouth at bedtime. Take with 29m tablet for a total of 766m 10/16/20   AnDaisy FloroDO  topiramate (TOPAMAX) 50 MG tablet Take 1 tablet (50 mg total) by mouth at bedtime. Take with 2530mablet for a total of 9m30m/17/22   AndeDaisy Floro  traZODone (DESYREL) 50 MG tablet Take 50 mg by mouth at bedtime.  04/02/20   [provider]  venlafaxine XR (EFFEXOR-XR) 150 MG 24 hr capsule Take 1 capsule (150 mg total) by mouth daily. 09/08/20   AndeDaisy Floro                                                                                                                                    Past Surgical History Past Surgical History:  Procedure Laterality Date  . ABDOMINAL HYSTERECTOMY  2015   "still have 1 ovary"  . AORTIC ARCH ANGIOGRAPHY  06/14/2019  . AORTIC ARCH ANGIOGRAPHY N/A 06/14/2019   Procedure: AORTIC ARCH ANGIOGRAPHY;  Surgeon: BerrLorretta Harp;  [facility];  Service: Cardiovascular;  Laterality: N/A;  . BIOPSY  07/25/2018   Procedure: BIOPSY;  Surgeon: BeavThornton Park;  [facility];;  .  CAROTID-SUBCLAVIAN BYPASS GRAFT Left 07/30/2019   Procedure: LEFT CAROTID-SUBCLAVIAN BYPASS GRAFT;  Surgeon: Marty Heck, MD;   Location: [facility];  Service: Vascular;  Laterality: Left;  . CESAREAN SECTION  1988; 1996  . CORONARY STENT INTERVENTION N/A 10/27/2018   Procedure: CORONARY STENT INTERVENTION;  Surgeon: Sherren Mocha, MD;  Location: [facility];  Service: Cardiovascular;  Laterality: N/A;  . ESOPHAGOGASTRODUODENOSCOPY (EGD) WITH PROPOFOL N/A 07/25/2018   Procedure: ESOPHAGOGASTRODUODENOSCOPY (EGD) WITH PROPOFOL;  Surgeon: Thornton Park, MD;  Location: [facility];  Service: Gastroenterology;  Laterality: N/A;  . KNEE RECONSTRUCTION Left 1980  . LAPAROSCOPIC CHOLECYSTECTOMY    . LEFT HEART CATH AND CORONARY ANGIOGRAPHY N/A 10/27/2018   Procedure: LEFT HEART CATH AND CORONARY ANGIOGRAPHY;  Surgeon: Sherren Mocha, MD;  Location: [facility];  Service: Cardiovascular;  Laterality: N/A;  . SUBCLAVIAN STENT PLACEMENT Left 2010  . TONSILLECTOMY  1970  . UPPER EXTREMITY ANGIOGRAPHY Right 06/14/2019   Procedure: UPPER EXTREMITY ANGIOGRAPHY;  Surgeon: Lorretta Harp, MD;  Location: [facility];  Service: Cardiovascular;  Laterality: Right;  upper ext   Family History Family History  Problem Relation Age of Onset  . Diabetes Mother   . Hypertension Mother   . Hyperlipidemia Mother   . CAD Mother   . Cervical cancer Mother   . CAD Father   . Heart failure Father   . Testicular cancer Father   . Hypertension Sister   . Hyperlipidemia Sister   . Hypertension Brother   . Hyperlipidemia Brother     Social History Social History   Tobacco Use  . Smoking status: Former Smoker    Packs/day: 2.00    Years: 36.00    Pack years: 72.00    Types: Cigarettes    Quit date: 05/14/2014    Years since quitting: 6.5  . Smokeless tobacco: Never Used  Vaping Use  . Vaping Use: Former  Substance Use Topics  . Alcohol use: Not Currently  . Drug use: Not Currently   Allergies Ilosone [erythromycin], Keflex [cephalexin], Penicillins, Iron, Metformin and related, and Morphine and  related  Review of Systems Review of Systems  Unable to perform ROS: Mental status change    Physical Exam Vital Signs  I have reviewed the triage vital signs BP 101/85 (BP Location: Right Arm)   Pulse (!) 107   Temp 97.9 F (36.6 C) (Oral)   Resp (!) 26   Ht _0  (1.6 m)   Wt 108.9 kg   LMP 08/03/2013   SpO2 99%   BMI 42.53 kg/m   Physical Exam Vitals reviewed. Exam conducted with a chaperone present.  Constitutional:      General: She is not in acute distress.    Appearance: She is well-developed. She is ill-appearing.  HENT:     Head: Normocephalic and atraumatic.     Nose: Nose normal.     Mouth/Throat:     Comments: Dry MMM with dried blood. Eyes:     General: No scleral icterus.       Right eye: No discharge.        Left eye: No discharge.     Conjunctiva/sclera: Conjunctivae normal.     Pupils: Pupils are equal, round, and reactive to light.  Cardiovascular:     Rate and Rhythm: Normal rate and regular rhythm.     Heart sounds: No murmur heard. No friction rub. No gallop.   Pulmonary:     Effort: Pulmonary effort is normal.  No respiratory distress.     Breath sounds: Normal breath sounds. No stridor. No rales.  Abdominal:     General: There is no distension.     Palpations: Abdomen is soft.     Tenderness: There is no abdominal tenderness.  Genitourinary:    Comments: Tarry stool smear on perineum Musculoskeletal:        General: No tenderness.     Cervical back: Normal range of motion and neck supple.  Skin:    General: Skin is warm and dry.     Findings: No erythema or rash.  Neurological:     Mental Status: She is oriented to person, place, and time. She is lethargic.     GCS: GCS eye subscore is 3. GCS verbal subscore is 4. GCS motor subscore is 6.     Comments: Moves all extremities      ED Results and Treatments Labs (all labs ordered are listed, but only abnormal results are displayed) Labs Reviewed  COMPREHENSIVE METABOLIC PANEL -  Abnormal; Notable for the following components:      Result Value   Chloride 96 (*)    CO2 <7 (*)    Glucose, Bld 924 (*)    BUN 77 (*)    Creatinine, Ser 2.48 (*)    Calcium 8.2 (*)    AST 12 (*)    Total Bilirubin 3.7 (*)    GFR, Estimated 22 (*)    All other components within normal limits  CBC - Abnormal; Notable for the following components:   WBC 20.8 (*)    RBC 7.32 (*)    HCT 49.3 (*)    MCV 67.3 (*)    MCH 19.9 (*)    MCHC 29.6 (*)    RDW 18.2 (*)    All other components within normal limits  URINALYSIS, ROUTINE W REFLEX MICROSCOPIC - Abnormal; Notable for the following components:   Color, Urine STRAW (*)    Glucose, UA >=500 (*)    Hgb urine dipstick SMALL (*)    Ketones, ur 80 (*)    All other components within normal limits  CBG MONITORING, ED - Abnormal; Notable for the following components:   Glucose-Capillary >600 (*)    All other components within normal limits  POC OCCULT BLOOD, ED - Abnormal; Notable for the following components:   Fecal Occult Bld POSITIVE (*)    All other components within normal limits  I-STAT VENOUS BLOOD GAS, ED - Abnormal; Notable for the following components:   pH, Ven 7.145 (*)    pCO2, Ven 23.8 (*)    pO2, Ven 101.0 (*)    Bicarbonate 8.2 (*)    TCO2 9 (*)    Acid-base deficit 19.0 (*)    Sodium 133 (*)    Calcium, Ion 1.03 (*)    HCT 50.0 (*)    Hemoglobin 17.0 (*)    All other components within normal limits  I-STAT CHEM 8, ED - Abnormal; Notable for the following components:   Sodium 134 (*)    BUN 64 (*)    Creatinine, Ser 1.30 (*)    Glucose, Bld >700 (*)    Calcium, Ion 1.08 (*)    TCO2 10 (*)    Hemoglobin 16.7 (*)    HCT 49.0 (*)    All other components within normal limits  CBG MONITORING, ED - Abnormal; Notable for the following components:   Glucose-Capillary >600 (*)    All other components within normal limits  RESP PANEL BY RT-PCR (FLU A&B, COVID) ARPGX2  LACTIC ACID, PLASMA  PROTIME-INR  APTT   LACTIC ACID, PLASMA  LIPASE, BLOOD  SALICYLATE LEVEL  ACETAMINOPHEN LEVEL  ETHANOL  AMMONIA  BETA-HYDROXYBUTYRIC ACID  BETA-HYDROXYBUTYRIC ACID  BETA-HYDROXYBUTYRIC ACID  I-STAT BETA HCG BLOOD, ED ([facility], [facility], [facility] ONLY)  TYPE AND SCREEN                                                                                                                         EKG  EKG Interpretation  Date/Time:  Saturday Dec 13 2020 04:16:49 EDT Ventricular Rate:  115 PR Interval:  124 QRS Duration: 100 QT Interval:  360 QTC Calculation: 498 R Axis:   52 Text Interpretation: Sinus tachycardia Biatrial enlargement Nonspecific ST and T wave abnormality Abnormal ECG Confirmed by Addison Lank [phone]) on 12/13/2020 5:02:13 AM      Radiology No results found.  Pertinent labs & imaging results that were available during my care of the patient were reviewed by me and considered in my medical decision making (see chart for details).  Medications Ordered in ED Medications  sodium chloride 0.9 % bolus 1,000 mL (1,000 mLs Intravenous New Bag/Given 12/13/20 0518)    Followed by  sodium chloride 0.9 % bolus 1,000 mL (1,000 mLs Intravenous New Bag/Given 12/13/20 0531)    Followed by  0.9 %  sodium chloride infusion (has no administration in time range)  insulin regular, human (MYXREDLIN) 100 units/ 100 mL infusion (8 Units/hr Intravenous New Bag/Given 12/13/20 0544)  dextrose 5 % in lactated ringers infusion (has no administration in time range)  dextrose 50 % solution 0-50 mL (has no administration in time range)  potassium chloride 10 mEq in 100 mL IVPB (has no administration in time range)  0.9 %  sodium chloride infusion (has no administration in time range)  ondansetron (ZOFRAN) injection 4 mg (4 mg Intravenous Given 12/13/20 0526)  pantoprazole (PROTONIX) injection 40 mg (40 mg Intravenous Given 12/13/20 0526)                                                                                                                                     Procedures .1-3 Lead EKG Interpretation Performed by: Fatima Blank, MD Authorized by: Fatima Blank, MD     Interpretation: normal     ECG rate:  106   ECG rate assessment: tachycardic  Rhythm: sinus tachycardia     Ectopy: none     Conduction: normal   .Critical Care Performed by: Fatima Blank, MD Authorized by: Fatima Blank, MD   Critical care provider statement:    Critical care time (minutes):  45   Critical care was necessary to treat or prevent imminent or life-threatening deterioration of the following conditions:  Endocrine crisis, metabolic crisis, renal failure and dehydration   Critical care was time spent personally by me on the following activities:  Discussions with consultants, evaluation of patient's response to treatment, examination of patient, ordering and performing treatments and interventions, ordering and review of laboratory studies, ordering and review of radiographic studies, pulse oximetry, re-evaluation of patient's condition, obtaining history from patient or surrogate and review of old charts    (including critical care time)  Medical Decision Making / ED Course I have reviewed the nursing notes for this encounter and the patient's prior records (if available in EHR or on provided paperwork).   KENYETTE GUNDY was evaluated in Emergency Department on 12/13/2020 for the symptoms described in the history of present illness. She was evaluated in the context of the global COVID-19 pandemic, which necessitated consideration that the patient might be at risk for infection with the SARS-CoV-2 virus that causes COVID-19. Institutional protocols and algorithms that pertain to the evaluation of patients at risk for COVID-19 are in a state of rapid change based on information released by regulatory bodies including the CDC and federal and state organizations. These policies and algorithms were  followed during the patient's care in the ED.  AMS Likely DKA. Will assess for infectious cause as well though less likely. She is have upper GI bleed -EGD from 2019 revealed esophagitis and gastric ulcers.  Likely the source of her bleed.  No esophageal varices noted. No tenderness to palpation to the abdomen on exam. No evidence of trauma concerning for brain bleed. Patient moving all extremities to have a low suspicion for CVA.  Screening labs obtain.  CBG greater than 600.  VBG notable for acidosis.  CBC notable for leukocytosis and elevated hemoglobin with elevated hematocrit.  Likely hemoconcentration.  Lactic acid negative.  We will treat patient for DKA and place her on a insulin drip. 2 L of IV fluids ordered. Protonix ordered.  Patient is still maintaining her airway at this time, so there is no need for intubation.  UA without evidence of infection. CMP consistent with diabetic ketoacidosis.  Patient also has AKI.  Will have [facility] admit to SDU.      Final Clinical Impression(s) / ED Diagnoses Final diagnoses:  Hematemesis  Diabetic ketoacidosis without coma associated with type 2 diabetes mellitus ([place])      This chart was dictated using voice recognition software.  Despite best efforts to proofread,  errors can occur which can change the documentation meaning.   Fatima Blank, MD 12/13/20 [phone]

## 2020-12-13 NOTE — Progress Notes (Signed)
Latest troponin-51. Family med. Aware.

## 2020-12-13 NOTE — ED Notes (Signed)
Pts orientation seems to be improving. Pt has been able to communicate with RN. States RN has cold hands. Has been able to make request ex "its cold. Cover me up." pt intermittently follows commands

## 2020-12-13 NOTE — ED Notes (Signed)
Attempted report to 2W10. Per RN answering phone, Charge RN has not approved/review pt. Will call back within next few minutes to reattempt report

## 2020-12-13 NOTE — Progress Notes (Signed)
ABG attempted 3 Pray, Norwood Levo, MD notified, change order to VBG.

## 2020-12-13 NOTE — Progress Notes (Addendum)
Anion gap negative x 2 and improvement of BHB. Patient with average of 6 units/hr while on drip. Will transition to subq insulin.   -50 units lantus to be dosed now.  -Holding meal time short acting until patient is requesting food.  -Continue fluids with 1/2 NS at 125  -continue q4 BMP until Na normal   Also bladder scan at 341cc. Previously straight cath -Place foley   Wilber Oliphant, M.D.  9:42 PM 12/13/2020

## 2020-12-13 NOTE — ED Notes (Signed)
Pt was bladder scanned with 533 mL urine retained in the bladder. Plan per Dr.Walsh straight cath and drain bladder. Re-scan in 6 hours.

## 2020-12-13 NOTE — H&P (Addendum)
[facility] Admission History and Physical Service Pager: [phone]  Patient name: Morgan [name] Medical record number: [id number] Date of birth: 25-Oct-1965 Age: 55 y.o. Gender: female  Primary Care Provider: [name], DO Consultants: None Code Status: Full (unable to confirm due to AMS) Preferred Emergency Contact:   Chief Complaint: AMS   Assessment and Plan: Morgan [name] is a 55 y.o. female presenting with alerted mental status in the setting of Diabetic Ketoacidosis. PMH is significant for T2DM, espohagitis, PUD, chronic leukocytosis, CVA, NSTEMI, CAD s/p stent, HTN, hypothyroidism, bipolar disease, anxiety  AMS  Diabetic Ketoacidosis Patient presented to [facility] via EMS and found to be hyperglycemic with glucose 924 on CMP with Bicarb <7, consistent with Diabetic Ketoacidosis. VBG was significant for pH 7.145, pCO2 23.8. pO2 101 and HCO3 8.2. BUN 77. BHBT was >8.  Anion gap unable to be calculated due to low CO2.  UDS positive for amphetamines. She received 2 1L bolues of NaCl and was started on mIVF at 162m/hr/. Also received continuous insulin infusion and KCl infusion. On presentation patient maintaining airway, and is minimally responsive to verbal stimuli and withdraws to painful stimuli. Not able to give any history. GCS 12. Vitals significant for tachycardia with HR 119, tachypnec to 26. CT head without any acute findings. Cause of her DKA is unknown, though likely in the setting of non adherence to medication. Could potentially be infectious though not as likely as UA negative for infection, CXR normal. White count elevated though she has history of chronic leukocytosis. CT abdomen pelvis without any signs of intra-abdominal and pelvic abscesses. Could be caused by drugs, as UDS with amphetamines. Considering thyroid storm as a potential cause given her history of thyroid dysfunction, so we will check TSH. Considered cardiac cause given her  history of NSTEMI in CAD with stent though EKG without ischemic changes. Admitting patient for DKA and close monitoring of her labs. - admit to FPTS progressive, attending Dr. PThompson Grayer- CCM consulted and recommended:  - insulin drip  - crystalloid infusion with KCl added if K <4  -Starting D5 IV fluids once glucose falls below 250  -Transition to subcutaneous insulin once anion gap closes and patient is able to po  - frequent Neuro checks q 2h - Insulin gtt and CBG checks per Endo tool - BMP q 4 hours - CBG q1h  - Mag and Phos q 8 h - BHBT q 8 hrs  - A1c, TSH - CK - PT/OT once more stable    C/f Sepsis Patient meeting sepsis criteria given tachycardia, tachypnea, leukocytosis. Likely from DKA, and reassuring that CT and chest x-ray are normal, as well as normal LA at 1.9, but cannot completely rule out infection, will cover with empiric antibiotics. Patient with Penicillin and Cephalosporin allergies.  - Vancomycin and Aztreonam   Hypernatremia Na 144 in the setting of her DKA. Currently on normal saline, though if Na does not improve may consider switching to half normal saline.   - BMP q 4 h  Hypokalemia K+ 3.0 on admission and was repleted in ED - continue to monitor and replenish as needed   GI Bleed  Peptic ulcers  [facility] of [facility] FOBT positive. Last EGD in 2019 revealed esophagitis and gastric ulcers without esophageal varices. Hbg 14.6. Exam is suspicious for dried blood on her tongue. No emesis since arrival and patient is maintaining her airway. We will continue to monitor.  -  consider consulting GI  - f/u CBC - transfusion threshold 8  Leukocytosis, chronic  WBC was 20.8 on admission. Has history of leukocytosis without clear cause. Not taking any medication that would cause this, as the only the steroid is in her Advair - continue to monitor   T2DM  A1c 11.9 5 months ago, and 14.7 8 months ago. Per chart review  patient takes Jardiance 25 mg daily, insulin glargine 55 units twice daily, lispro 4 to 10 units 4 times daily (4 with coffee and 10 with meals) Per clinic visit on 09/18/20: History of low blood sugars, with irregularities in her medication schedule and eating schedule and was advised to switch to the below regimen:   - Lantus 55u AM 09:30 AM; Lantus 55u 09:30 PM  - Humalog 35u AM with breakfast  - Humalog 35u with a proper lunch; only 10 units with a snack  - Humalog 35u for dinner/at night; take only 10 units with a snack  - diabetes coordinator consult - will need close follow up at discharge   Hypothyroidism Patient takes Synthroid 150 mcg daily - f/u TSH   CAD, s/p stent  EKG with tachycardia and without acute ischemic changes   Hypertension  BP stable ranging from 101-138/69-85 Home medication: Metoprolol 50 mg twice daily - Hold home med given AMS  Hyperlipidemia Atorvastatin 52m daily - Hold home med given AMS    Bipolar Home medication: Latuda 60 mg nightly - Hold home med given AMS  Subclavian steal syndrome L subclavian artery stenosis shown on carotid Dopplers 04/17/2019 with confirmation from CT angiogram on 04/27/2019  FEN/GI: NPO Prophylaxis: SCDs due to c/f bleeding   Disposition: Progressive   History of Present Illness:  JRAQUELLE PIETROis a 55y.o. female [facility], and history of hematemis and melena. Her son reports that she was last seen normal on Thursday. Patient is unable to verbal communicate for history taking and son not answer call to obtain more information   Upon arrival at ED, CMP was significant for  glucose of 948, bicarb <7, Cl 96, BUN 77, Scr 2.48, BUN 77, Ca2+ 8.2, TBili 3.7 and GFR 22. CBC was significant for WBC 20.8  Review Of Systems: Unable to obtain due to AMS   Review of Systems   Patient Active Problem List   Diagnosis Date Noted  . DKA (diabetic ketoacidosis) ([place] 12/13/2020  .  Beta thalassemia minor 09/30/2020  . Microcytosis 09/18/2020  . Leg swelling 09/10/2020  . Oral pharyngeal candidiasis 08/31/2020  . Chronic left shoulder pain 08/17/2020  . Hyperglycemia 07/08/2020  . Type 2 diabetes mellitus with hyperglycemia ([place] 07/08/2020  . No-show for appointment 05/12/2020  . Cervical spinal stenosis 10/09/2019  . Status post carotid endarterectomy 07/30/2019  . Subclavian artery stenosis, left ([place] 07/30/2019  . Migraines 07/16/2019  . Vitamin D deficiency 05/19/2019  . Hyperglycemia due to diabetes mellitus ([place] 11/17/2018  . S/P coronary artery stent placement   . Hyperlipidemia LDL goal <70   . Hepatic steatosis   . Mild renal insufficiency 07/21/2018  . Leukocytosis 07/21/2018  . Bipolar depression ([place] 07/21/2018  . Incidental lung nodule 07/21/2018  . Prolonged QT interval 07/21/2018  . Hyperlipidemia 05/14/2016  . Hypothyroidism 05/14/2016  . Essential hypertension 05/14/2016    Past Medical History: Past Medical History:  Diagnosis Date  . Acute systolic heart failure ([place]   . Anxiety   . Asthma   . Bipolar depression (  [place])   . Carpal tunnel syndrome, bilateral 09/07/2019  . Chronic bronchitis ([place])   . Chronic esophagogastric ulcer   . Chronic stomach ulcer   . Diabetic ketoacidosis without coma associated with type 2 diabetes mellitus ([place]) 05/12/2019  . Diarrhea 12/25/2019  . Dysequilibrium 04/03/2020  . Elevated transaminase level 07/21/2018  . Encounter for screening colonoscopy 04/03/2020  . Encounter for screening mammogram for malignant neoplasm of breast 04/03/2020  . Esophagitis determined by endoscopy   . Extravasation accident, initial encounter 07/21/2018  . Fibroid   . GERD (gastroesophageal reflux disease)   . Heart murmur   . Hepatic steatosis   . History of blood transfusion 2010   "related to subclavian stent"  . History of hiatal hernia   . Hyperlipidemia   . Hypertension   . Hypothyroid   . LFTs abnormal   .  Migraine    "cerebral migraines; 1-2/month" (06/29/2018)  . Nausea 08/09/2018  . Need for immunization against influenza 04/03/2020  . Non-ST elevation (NSTEMI) myocardial infarction ([place]) 10/27/2018  . 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 SOB (shortness of breath)   . 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 Stenosis of left subclavian artery ([place]) 06/05/2019   Left subclavian artery stenosis. left upper extremity claudication and subclavian steal symptoms. Carotid Dopplers performed 04/17/2019 with suggestion of occluded left subclavian artery with retrograde vertebral filling and subsequent CT angiogram performed 04/27/2019 with confirmation -Admitted for elective PV angiogram in which recommendations were for a left common carotid to subclavian bypass.  . Stroke ([place])    "I've had 2; most recent one was ~ 2015 or before; affected balance" (06/29/2018)  . 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 Thalassemia minor   . Type II diabetes mellitus ([place])   . Vitamin D deficiency     Past Surgical History: Past Surgical History:  Procedure Laterality Date  . ABDOMINAL HYSTERECTOMY  2015   "still have 1 ovary"  . AORTIC ARCH ANGIOGRAPHY  06/14/2019  . AORTIC ARCH ANGIOGRAPHY N/A 06/14/2019   Procedure: AORTIC ARCH ANGIOGRAPHY;  Surgeon: Lorretta Harp, MD;  Location: [facility];  Service: Cardiovascular;  Laterality: N/A;  . BIOPSY  07/25/2018   Procedure: BIOPSY;  Surgeon: Thornton Park, MD;  Location: [facility];  Service: Gastroenterology;;  . CAROTID-SUBCLAVIAN BYPASS GRAFT Left 07/30/2019   Procedure: LEFT CAROTID-SUBCLAVIAN BYPASS GRAFT;  Surgeon: Marty Heck, MD;  Location: [facility];  Service: Vascular;  Laterality: Left;  . CESAREAN SECTION  1988; 1996  . CORONARY STENT INTERVENTION N/A 10/27/2018   Procedure: CORONARY STENT INTERVENTION;  Surgeon: Sherren Mocha, MD;  Location: [place] [facility] [facility];  Service: Cardiovascular;  Laterality: N/A;  .  ESOPHAGOGASTRODUODENOSCOPY (EGD) WITH PROPOFOL N/A 07/25/2018   Procedure: ESOPHAGOGASTRODUODENOSCOPY (EGD) WITH PROPOFOL;  Surgeon: Thornton Park, MD;  Location: [facility];  Service: Gastroenterology;  Laterality: N/A;  . KNEE RECONSTRUCTION Left 1980  . LAPAROSCOPIC CHOLECYSTECTOMY    . LEFT HEART CATH AND CORONARY ANGIOGRAPHY N/A 10/27/2018   Procedure: LEFT HEART CATH AND CORONARY ANGIOGRAPHY;  Surgeon: Sherren Mocha, MD;  Location: [facility];  Service: Cardiovascular;  Laterality: N/A;  . SUBCLAVIAN STENT PLACEMENT Left 2010  . TONSILLECTOMY  1970  . UPPER EXTREMITY ANGIOGRAPHY Right 06/14/2019   Procedure: UPPER EXTREMITY ANGIOGRAPHY;  Surgeon: Lorretta Harp, MD;  Location: [facility];  Service: Cardiovascular;  Laterality: Right;  upper ext    Social History: Social History   Tobacco Use  .  Smoking status: Former Smoker    Packs/day: 2.00    Years: 36.00    Pack years: 72.00    Types: Cigarettes    Quit date: 05/14/2014    Years since quitting: 6.5  . Smokeless tobacco: Never Used  Vaping Use  . Vaping Use: Former  Substance Use Topics  . Alcohol use: Not Currently  . Drug use: Not Currently   Family History: Family History  Problem Relation Age of Onset  . Diabetes Mother   . Hypertension Mother   . Hyperlipidemia Mother   . CAD Mother   . Cervical cancer Mother   . CAD Father   . Heart failure Father   . Testicular cancer Father   . Hypertension Sister   . Hyperlipidemia Sister   . Hypertension Brother   . Hyperlipidemia Brother     Allergies and Medications: Allergies  Allergen Reactions  . Ilosone [Erythromycin] Anaphylaxis  . Keflex [Cephalexin] Anaphylaxis  . Penicillins Anaphylaxis    Has patient had a PCN reaction causing immediate rash, facial/tongue/throat swelling, SOB or lightheadedness with hypotension: YES Has patient had a PCN reaction causing severe rash involving mucus membranes or skin necrosis: NO Has  patient had a PCN reaction that required hospitalizationNO Has patient had a PCN reaction occurring within the last 10 years: NO If all of the above answers are "NO", then may proceed with Cephalosporin use.  . Iron Other (See Comments)    Pt reports condition limiting Iron intake.  . Metformin And Related Other (See Comments)    UNSPECIFIED REACTION  Pt prefers to not take this medication Side effects  . Morphine And Related Other (See Comments)    Pt reports hallucinations   No current facility-administered medications on file prior to encounter.   Current Outpatient Medications on File Prior to Encounter  Medication Sig Dispense Refill  . albuterol (VENTOLIN HFA) 108 (90 Base) MCG/ACT inhaler Inhale 2-4 puffs into the lungs every 4 (four) hours as needed for wheezing (or cough). 8.5 g 0  . ARIPiprazole (ABILIFY) 5 MG tablet Take 1 tablet (5 mg total) by mouth daily. 30 tablet 0  . aspirin 81 MG tablet Take 1 tablet (81 mg total) by mouth daily. 30 tablet 1  . atorvastatin (LIPITOR) 80 MG tablet Take 1 tablet (80 mg total) by mouth daily. 30 tablet 6  . Cholecalciferol (VITAMIN D3) 25 MCG (1000 UT) CAPS Take 1,000-2,000 Units by mouth See admin instructions. Take 2 tablets by mouth once a day on Mon/Wed/Fri and take 1 tablet on Sun/Tues/Thurs/Sat    . Continuous Blood Gluc Receiver (FREESTYLE LIBRE 2 READER) DEVI 1 each by Does not apply route 6 (six) times daily. 1 each 0  . Continuous Blood Gluc Sensor (FREESTYLE LIBRE 2 SENSOR) MISC 1 each by Does not apply route 6 (six) times daily. 1 each 6  . diazepam (VALIUM) 2 MG tablet Take 1 tablet (2 mg total) by mouth every 12 (twelve) hours as needed for up to 6 doses (vertigo). 6 tablet 0  . empagliflozin (JARDIANCE) 25 MG TABS tablet Take 1 tablet (25 mg total) by mouth daily. 90 tablet 3  . EPINEPHrine 0.3 mg/0.3 mL IJ SOAJ injection Inject 0.3 mg into the muscle once as needed for anaphylaxis (for an anaphylactic reaction). 1 each 1  .  fluconazole (DIFLUCAN) 150 MG tablet Take 1 tablet today by mouth and one tablet tomorrow by mouth. 2 tablet 0  . fluticasone-salmeterol (ADVAIR HFA) 115-21 MCG/ACT inhaler USE 2 INHALATIONS  BY MOUTH  TWICE DAILY 24 g 0  . furosemide (LASIX) 20 MG tablet Take 1 tablet (20 mg total) by mouth 2 (two) times daily. 60 tablet 3  . Glucagon, rDNA, (GLUCAGON EMERGENCY) 1 MG KIT Inject 1 mg into the vein once as needed (FOR ONSET OF HYPOGLYCEMIA). 1 kit 2  . glucose blood (TRUE METRIX BLOOD GLUCOSE TEST) test strip Use as instructed 100 each 12  . Insulin Glargine (BASAGLAR KWIKPEN) 100 UNIT/ML Inject 55 Units into the skin 2 (two) times daily. 45 mL 3  . insulin lispro (HUMALOG KWIKPEN) 100 UNIT/ML KwikPen Inject 4-10 Units into the skin 4 (four) times daily. 4 with coffee and 10 with meals    . Insulin Pen Needle (PEN NEEDLES) 32G X 4 MM MISC 1 Device by Does not apply route 6 (six) times daily. 200 each 11  . lamoTRIgine (LAMICTAL) 200 MG tablet Take 1 tablet (200 mg total) by mouth daily. 30 tablet 0  . levothyroxine (SYNTHROID) 150 MCG tablet Take 1 tablet (150 mcg total) by mouth at bedtime. 3:30 Am 30 tablet 0  . losartan (COZAAR) 100 MG tablet Take 1 tablet (100 mg total) by mouth daily. 30 tablet 0  . metoprolol tartrate (LOPRESSOR) 50 MG tablet Take 1 tablet (50 mg total) by mouth 2 (two) times daily. 60 tablet 6  . Multiple Vitamin (MULTIVITAMIN WITH MINERALS) TABS tablet Take 1 tablet by mouth daily. Woman 50 +    . nitroGLYCERIN (NITROSTAT) 0.4 MG SL tablet Place 1 tablet (0.4 mg total) under the tongue every 5 (five) minutes x 3 doses as needed for chest pain. 30 tablet 12  . nystatin (MYCOSTATIN/NYSTOP) powder Apply 1 application topically daily as needed (infection). 30 g 3  . nystatin ointment (MYCOSTATIN) Apply 1 application topically daily as needed (infection). 30 g 3  . pantoprazole (PROTONIX) 40 MG tablet Take 1 tablet (40 mg total) by mouth 2 (two) times daily before a meal. X 8 weeks  (Patient taking differently: Take 40 mg by mouth 2 (two) times daily.) 60 tablet 3  . potassium chloride SA (KLOR-CON) 20 MEQ tablet Take 1 tablet (20 mEq total) by mouth daily. 90 tablet 3  . SUMAtriptan (IMITREX) 25 MG tablet  TAKE 1 TABLET BY MOUTH EVERY 2 HOURS AS NEEDED FOR MIGRAINE. MAY REPEAT IN 2 HOURS IF HEADACHE PERSISTS OR RECURS. PLEASE TAKE AS SOON AS YOU FEEL YOUR MIGRAINE STARTING.  (Patient taking differently: Take 25 mg by mouth every 2 (two) hours as needed for migraine or headache. May repeat in 2 hours.) 10 tablet 0  . topiramate (TOPAMAX) 25 MG tablet Take 1 tablet (25 mg total) by mouth at bedtime. Take with 62m tablet for a total of 7110m 30 tablet 0  . topiramate (TOPAMAX) 50 MG tablet Take 1 tablet (50 mg total) by mouth at bedtime. Take with 2534mablet for a total of 37m59m0 tablet 0  . traZODone (DESYREL) 50 MG tablet Take 50 mg by mouth at bedtime.     . veMarland Kitchenlafaxine XR (EFFEXOR-XR) 150 MG 24 hr capsule Take 1 capsule (150 mg total) by mouth daily. 90 capsule 1    Objective: BP 110/74   Pulse (!) 111   Temp 97.9 F (36.6 C) (Oral)   Resp 19   Ht '5\' 3"'  (1.6 m)   Wt 108.9 kg   LMP 08/03/2013   SpO2 96%   BMI 42.53 kg/m  Exam: General: ill appearing, laying in bed mumbling, NAD Eyes: PERRL  ENTM: Dry oral MM with c/f dried blood in mouth and at base of nares  Neck: Supple.  Cardiovascular: RRR no murmurs  Respiratory: CTAB normal WOB Gastrointestinal: soft, non distended, non tender to palpation  MSK: withdrawals LE on palpation . Unable to assess strength and ROM Derm: warm, dry. No visible rashes or lesions Neuro: Not responding to questions but able to follow some commands (squeezing my fingers). Responds to verbal and painful stimuli    Labs and Imaging: CBC BMET  Recent Labs  Lab 12/13/20 1036  WBC 24.7*  HGB 13.3  HCT 45.8  PLT 252   Recent Labs  Lab 12/13/20 1122  NA 142  K 3.6  CL 114*  CO2 12*  BUN 54*  CREATININE 1.65*  GLUCOSE  468*  CALCIUM 7.8*     EKG: Sinus Tachycardia   DG Chest 1 View  Result Date: 12/13/2020 CLINICAL DATA:  Hematemesis EXAM: CHEST  1 VIEW COMPARISON:  07/08/2020 FINDINGS: Low volume chest with chronically elevated left diaphragm and a overlying scar. No pulmonary edema, air bronchogram, effusion, or pneumothorax. Normal heart size. Clips at the left supraclavicular fossa which may relate to history of subclavian steal syndrome. Coronary stenting. IMPRESSION: 1. No acute finding. 2. Chronic elevation of the left diaphragm with overlying scar. Electronically Signed   By: Monte Fantasia M.D.   On: 12/13/2020 07:06   DG Abd 1 View  Result Date: 12/13/2020 CLINICAL DATA:  Dehydration and vomiting. EXAM: ABDOMEN - 1 VIEW COMPARISON:  11/24/2019 abdominal CT FINDINGS: Normal bowel gas pattern. No abnormal stool retention. Elevated left diaphragm which is chronic based on prior imaging. Cholecystectomy clips. IMPRESSION: 1. Normal bowel gas pattern. 2. Chronically elevated left diaphragm. Electronically Signed   By: Monte Fantasia M.D.   On: 12/13/2020 07:05   CT Head Wo Contrast  Result Date: 12/13/2020 CLINICAL DATA:  Mental status change with unknown cause. Hyperglycemia EXAM: CT HEAD WITHOUT CONTRAST TECHNIQUE: Contiguous axial images were obtained from the base of the skull through the vertex without intravenous contrast. COMPARISON:  11/21/2020 FINDINGS: Brain: No evidence of acute infarction, hemorrhage, hydrocephalus, extra-axial collection or mass lesion/mass effect. Small remote left cerebellar infarct. Patchy areas of low-density have a band like morphology on reformats and are attributed to artifact. Vascular: No hyperdense vessel or unexpected calcification. Skull: Normal. Negative for fracture or focal lesion. Sinuses/Orbits: No acute finding. IMPRESSION: 1. Stable compared to prior.  No acute finding. 2. Small remote left cerebellar infarct. Electronically Signed   By: Monte Fantasia M.D.    On: 12/13/2020 07:08   CT ABDOMEN PELVIS W CONTRAST  Result Date: 12/13/2020 CLINICAL DATA:  Evaluate for abdominal abscess/infection. Elevated white blood cell count. EXAM: CT ABDOMEN AND PELVIS WITH CONTRAST TECHNIQUE: Multidetector CT imaging of the abdomen and pelvis was performed using the standard protocol following bolus administration of intravenous contrast. CONTRAST:  3m OMNIPAQUE IOHEXOL 300 MG/ML  SOLN COMPARISON:  11/24/2019 FINDINGS: Lower chest: Atelectasis and volume loss is identified within the left lower lobe and lingula. Perifissural nodule is noted along the major fissure measuring 5 mm. Dependent changes are identified within the right base. Hepatobiliary: Tiny low-density structure along the dome of liver measures 5 mm and is unchanged from previous exam. Focal area of fatty deposition is again noted within segment 5 adjacent to the gallbladder fossa. No suspicious liver lesion identified. Cholecystectomy. No signs of bile duct dilatation. Pancreas: Unremarkable. No pancreatic ductal dilatation or surrounding inflammatory changes. Spleen: Normal in size without focal  abnormality. Adrenals/Urinary Tract: Adreniform enlargement of the left adrenal gland appears stable from prior exam. Right adrenal gland appears normal. No nephrolithiasis, hydronephrosis or mass identified within the kidneys. The urinary bladder is unremarkable for degree of distension. Stomach/Bowel: Stomach appears normal. There is no small bowel wall thickening, inflammation or distension. The appendix is visualized and appears normal. Mild colonic wall thickening is identified. This is nonspecific and may reflect incomplete distention. Uncomplicated pancolitis cannot be excluded. No signs of pneumatosis, bowel perforation or abscess. Vascular/Lymphatic: Aortic atherosclerosis. No aneurysm. No abdominopelvic adenopathy. Reproductive: Uterus and bilateral adnexa are unremarkable. Other: No free fluid or fluid  collections. Musculoskeletal: No acute or significant osseous findings. IMPRESSION: 1. No signs of intra-abdominal or pelvic abscess. No focal fluid collections or free fluid noted. 2. Equivocal colonic wall thickening is identified. This is nonspecific and may reflect incomplete distention. Uncomplicated pancolitis cannot be excluded. No signs of pneumatosis, bowel perforation or abscess. Clinical correlation advised. Aortic Atherosclerosis (ICD10-I70.0). Electronically Signed   By: Kerby Moors M.D.   On: 12/13/2020 11:30    Shary Key, DO 12/13/2020, 2:56 PM PGY-1, [place] Intern pager: [phone], text pages welcome   FPTS Upper-Level Resident Addendum   I have independently interviewed and examined the patient. I have discussed the above with the original author and agree with their documentation. Please see also any attending notes.    Carollee Leitz MD PGY-2, [facility] 12/13/2020 8:30 PM  [place] Service pager: [phone] (text pages welcome through [place])

## 2020-12-14 ENCOUNTER — Inpatient Hospital Stay (HOSPITAL_COMMUNITY): Payer: Managed Care, Other (non HMO)

## 2020-12-14 ENCOUNTER — Encounter (HOSPITAL_COMMUNITY): Payer: Self-pay | Admitting: Family Medicine

## 2020-12-14 DIAGNOSIS — G9340 Encephalopathy, unspecified: Secondary | ICD-10-CM | POA: Diagnosis not present

## 2020-12-14 DIAGNOSIS — R000 Tachycardia, unspecified: Secondary | ICD-10-CM

## 2020-12-14 DIAGNOSIS — E1110 Type 2 diabetes mellitus with ketoacidosis without coma: Secondary | ICD-10-CM | POA: Diagnosis not present

## 2020-12-14 LAB — BLOOD GAS, VENOUS
Acid-base deficit: 6.9 mmol/L — ABNORMAL HIGH (ref 0.0–2.0)
Bicarbonate: 17.6 mmol/L — ABNORMAL LOW (ref 20.0–28.0)
Drawn by: 164
FIO2: 21
O2 Saturation: 85.2 %
Patient temperature: 37
pCO2, Ven: 33 mmHg — ABNORMAL LOW (ref 44.0–60.0)
pH, Ven: 7.348 (ref 7.250–7.430)
pO2, Ven: 49.4 mmHg — ABNORMAL HIGH (ref 32.0–45.0)

## 2020-12-14 LAB — COMPREHENSIVE METABOLIC PANEL
ALT: 15 U/L (ref 0–44)
AST: 14 U/L — ABNORMAL LOW (ref 15–41)
Albumin: 2.9 g/dL — ABNORMAL LOW (ref 3.5–5.0)
Alkaline Phosphatase: 76 U/L (ref 38–126)
Anion gap: 13 (ref 5–15)
BUN: 27 mg/dL — ABNORMAL HIGH (ref 6–20)
CO2: 15 mmol/L — ABNORMAL LOW (ref 22–32)
Calcium: 8 mg/dL — ABNORMAL LOW (ref 8.9–10.3)
Chloride: 114 mmol/L — ABNORMAL HIGH (ref 98–111)
Creatinine, Ser: 1.09 mg/dL — ABNORMAL HIGH (ref 0.44–1.00)
GFR, Estimated: 60 mL/min — ABNORMAL LOW (ref >60)
Glucose, Bld: 407 mg/dL — ABNORMAL HIGH (ref 70–99)
Potassium: 4.3 mmol/L (ref 3.5–5.1)
Sodium: 142 mmol/L (ref 135–145)
Total Bilirubin: 1 mg/dL (ref 0.3–1.2)
Total Protein: 5.6 g/dL — ABNORMAL LOW (ref 6.5–8.1)

## 2020-12-14 LAB — CBC WITH DIFFERENTIAL/PLATELET
Abs Immature Granulocytes: 0.21 10*3/uL — ABNORMAL HIGH (ref 0.00–0.07)
Basophils Absolute: 0 10*3/uL (ref 0.0–0.1)
Basophils Relative: 0 %
Eosinophils Absolute: 0 10*3/uL (ref 0.0–0.5)
Eosinophils Relative: 0 %
HCT: 40.7 % (ref 36.0–46.0)
Hemoglobin: 12.4 g/dL (ref 12.0–15.0)
Immature Granulocytes: 1 %
Lymphocytes Relative: 6 %
Lymphs Abs: 1.4 10*3/uL (ref 0.7–4.0)
MCH: 20.1 pg — ABNORMAL LOW (ref 26.0–34.0)
MCHC: 30.5 g/dL (ref 30.0–36.0)
MCV: 65.9 fL — ABNORMAL LOW (ref 80.0–100.0)
Monocytes Absolute: 1.2 10*3/uL — ABNORMAL HIGH (ref 0.1–1.0)
Monocytes Relative: 5 %
Neutro Abs: 21.1 10*3/uL — ABNORMAL HIGH (ref 1.7–7.7)
Neutrophils Relative %: 88 %
Platelets: 193 10*3/uL (ref 150–400)
RBC: 6.18 MIL/uL — ABNORMAL HIGH (ref 3.87–5.11)
RDW: 17.8 % — ABNORMAL HIGH (ref 11.5–15.5)
WBC: 23.9 10*3/uL — ABNORMAL HIGH (ref 4.0–10.5)
nRBC: 0 % (ref 0.0–0.2)

## 2020-12-14 LAB — GLUCOSE, CAPILLARY
Glucose-Capillary: 185 mg/dL — ABNORMAL HIGH (ref 70–99)
Glucose-Capillary: 195 mg/dL — ABNORMAL HIGH (ref 70–99)
Glucose-Capillary: 233 mg/dL — ABNORMAL HIGH (ref 70–99)
Glucose-Capillary: 265 mg/dL — ABNORMAL HIGH (ref 70–99)
Glucose-Capillary: 274 mg/dL — ABNORMAL HIGH (ref 70–99)
Glucose-Capillary: 312 mg/dL — ABNORMAL HIGH (ref 70–99)
Glucose-Capillary: 324 mg/dL — ABNORMAL HIGH (ref 70–99)

## 2020-12-14 LAB — TROPONIN I (HIGH SENSITIVITY)
Troponin I (High Sensitivity): 213 ng/L (ref <18)
Troponin I (High Sensitivity): 331 ng/L (ref <18)
Troponin I (High Sensitivity): 513 ng/L (ref <18)
Troponin I (High Sensitivity): 699 ng/L (ref <18)
Troponin I (High Sensitivity): 520 ng/L (ref <18)

## 2020-12-14 LAB — BASIC METABOLIC PANEL
Anion gap: 9 (ref 5–15)
BUN: 22 mg/dL — ABNORMAL HIGH (ref 6–20)
CO2: 16 mmol/L — ABNORMAL LOW (ref 22–32)
Calcium: 8.1 mg/dL — ABNORMAL LOW (ref 8.9–10.3)
Chloride: 117 mmol/L — ABNORMAL HIGH (ref 98–111)
Creatinine, Ser: 0.84 mg/dL (ref 0.44–1.00)
GFR, Estimated: >60 mL/min (ref >60)
Glucose, Bld: 321 mg/dL — ABNORMAL HIGH (ref 70–99)
Potassium: 4.8 mmol/L (ref 3.5–5.1)
Sodium: 142 mmol/L (ref 135–145)

## 2020-12-14 LAB — PHOSPHORUS: Phosphorus: 2.7 mg/dL (ref 2.5–4.6)

## 2020-12-14 MED ORDER — DEXTROSE 5 % IV SOLN
750.0000 mg | Freq: Three times a day (TID) | INTRAVENOUS | Status: DC
  Administered 2020-12-14 – 2020-12-15 (×2): 750 mg via INTRAVENOUS
  Filled 2020-12-14 (×5): qty 15

## 2020-12-14 MED ORDER — INSULIN ASPART 100 UNIT/ML IJ SOLN
0.0000 [IU] | Freq: Four times a day (QID) | INTRAMUSCULAR | Status: DC
  Administered 2020-12-14: 11 [IU] via SUBCUTANEOUS

## 2020-12-14 MED ORDER — CHLORHEXIDINE GLUCONATE CLOTH 2 % EX PADS
6.0000 | MEDICATED_PAD | Freq: Every day | CUTANEOUS | Status: DC
  Administered 2020-12-14 – 2020-12-16 (×3): 6 via TOPICAL

## 2020-12-14 MED ORDER — MAGIC MOUTHWASH W/LIDOCAINE
5.0000 mL | Freq: Once | ORAL | Status: AC
  Administered 2020-12-14: 5 mL via ORAL
  Filled 2020-12-14: qty 5

## 2020-12-14 MED ORDER — SODIUM CHLORIDE 0.9 % IV SOLN
2.0000 g | Freq: Three times a day (TID) | INTRAVENOUS | Status: DC
  Administered 2020-12-14 – 2020-12-15 (×3): 2 g via INTRAVENOUS
  Filled 2020-12-14 (×5): qty 2

## 2020-12-14 MED ORDER — IOHEXOL 350 MG/ML SOLN
51.0000 mL | Freq: Once | INTRAVENOUS | Status: AC | PRN
  Administered 2020-12-14: 51 mL via INTRAVENOUS

## 2020-12-14 MED ORDER — INSULIN ASPART 100 UNIT/ML IJ SOLN
0.0000 [IU] | INTRAMUSCULAR | Status: DC
  Administered 2020-12-14: 8 [IU] via SUBCUTANEOUS
  Administered 2020-12-14 (×2): 3 [IU] via SUBCUTANEOUS
  Administered 2020-12-14: 8 [IU] via SUBCUTANEOUS
  Administered 2020-12-14: 11 [IU] via SUBCUTANEOUS
  Administered 2020-12-15: 2 [IU] via SUBCUTANEOUS
  Administered 2020-12-15: 8 [IU] via SUBCUTANEOUS
  Administered 2020-12-15 (×2): 3 [IU] via SUBCUTANEOUS
  Administered 2020-12-15: 2 [IU] via SUBCUTANEOUS
  Administered 2020-12-16: 5 [IU] via SUBCUTANEOUS
  Administered 2020-12-16: 2 [IU] via SUBCUTANEOUS
  Administered 2020-12-16: 3 [IU] via SUBCUTANEOUS
  Administered 2020-12-16: 11 [IU] via SUBCUTANEOUS
  Administered 2020-12-16: 15 [IU] via SUBCUTANEOUS
  Administered 2020-12-16: 5 [IU] via SUBCUTANEOUS
  Administered 2020-12-17: 11 [IU] via SUBCUTANEOUS

## 2020-12-14 NOTE — Progress Notes (Signed)
Patient arrived back to 2w bed 10.vitals stable, patient drowsy, but responding to verbal stimuli.

## 2020-12-14 NOTE — Evaluation (Signed)
Clinical/Bedside Swallow Evaluation Patient Details  Name: AVANA KREISER MRN: [id number] Date of Birth: 02-Mar-1966  Today's Date: 12/14/2020 Time: SLP Start Time (ACUTE ONLY): 0850 SLP Stop Time (ACUTE ONLY): 0913 SLP Time Calculation (min) (ACUTE ONLY): 23 min  Past Medical History:  Past Medical History:  Diagnosis Date  . Acute systolic heart failure ([place])   . Anxiety   . Asthma   . Bipolar depression ([place])   . Carpal tunnel syndrome, bilateral 09/07/2019  . Chronic bronchitis ([place])   . Chronic esophagogastric ulcer   . Chronic stomach ulcer   . Diabetic ketoacidosis without coma associated with type 2 diabetes mellitus ([place]) 05/12/2019  . Diarrhea 12/25/2019  . Dysequilibrium 04/03/2020  . Elevated transaminase level 07/21/2018  . Encounter for screening colonoscopy 04/03/2020  . Encounter for screening mammogram for malignant neoplasm of breast 04/03/2020  . Esophagitis determined by endoscopy   . Extravasation accident, initial encounter 07/21/2018  . Fibroid   . GERD (gastroesophageal reflux disease)   . Heart murmur   . Hepatic steatosis   . History of blood transfusion 2010   "related to subclavian stent"  . History of hiatal hernia   . Hyperlipidemia   . Hypertension   . Hypothyroid   . LFTs abnormal   . Migraine    "cerebral migraines; 1-2/month" (06/29/2018)  . Nausea 08/09/2018  . Need for immunization against influenza 04/03/2020  . Non-ST elevation (NSTEMI) myocardial infarction ([place]) 10/27/2018  . 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 SOB (shortness of breath)   . 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 Stenosis of left subclavian artery ([place]) 06/05/2019   Left subclavian artery stenosis. left upper extremity claudication and subclavian steal symptoms. Carotid Dopplers performed 04/17/2019 with suggestion of occluded left subclavian artery with retrograde vertebral filling and subsequent CT angiogram performed 04/27/2019 with confirmation -Admitted for  elective PV angiogram in which recommendations were for a left common carotid to subclavian bypass.  . Stroke ([place])    "I've had 2; most recent one was ~ 2015 or before; affected balance" (06/29/2018)  . 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 Thalassemia minor   . Type II diabetes mellitus ([place])   . Vitamin D deficiency    Past Surgical History:  Past Surgical History:  Procedure Laterality Date  . ABDOMINAL HYSTERECTOMY  2015   "still have 1 ovary"  . AORTIC ARCH ANGIOGRAPHY  06/14/2019  . AORTIC ARCH ANGIOGRAPHY N/A 06/14/2019   Procedure: AORTIC ARCH ANGIOGRAPHY;  Surgeon: Lorretta Harp, MD;  Location: [facility];  Service: Cardiovascular;  Laterality: N/A;  . BIOPSY  07/25/2018   Procedure: BIOPSY;  Surgeon: Thornton Park, MD;  Location: [facility] ENDOSCOPY;  Service: Gastroenterology;;  . CAROTID-SUBCLAVIAN BYPASS GRAFT Left 07/30/2019   Procedure: LEFT CAROTID-SUBCLAVIAN BYPASS GRAFT;  Surgeon: Marty Heck, MD;  Location: [facility];  Service: Vascular;  Laterality: Left;  . CESAREAN SECTION  1988; 1996  . CORONARY STENT INTERVENTION N/A 10/27/2018   Procedure: CORONARY STENT INTERVENTION;  Surgeon: Sherren Mocha, MD;  Location: [facility];  Service: Cardiovascular;  Laterality: N/A;  . ESOPHAGOGASTRODUODENOSCOPY (EGD) WITH PROPOFOL N/A 07/25/2018   Procedure: ESOPHAGOGASTRODUODENOSCOPY (EGD) WITH PROPOFOL;  Surgeon: Thornton Park, MD;  Location: [facility];  Service: Gastroenterology;  Laterality: N/A;  . KNEE RECONSTRUCTION Left 1980  . LAPAROSCOPIC CHOLECYSTECTOMY    . LEFT HEART CATH AND CORONARY ANGIOGRAPHY N/A 10/27/2018   Procedure: LEFT HEART CATH AND CORONARY ANGIOGRAPHY;  Surgeon: Sherren Mocha, MD;  Location: [facility];  Service: Cardiovascular;  Laterality: N/A;  . SUBCLAVIAN STENT PLACEMENT Left 2010  . TONSILLECTOMY  1970  . UPPER EXTREMITY ANGIOGRAPHY Right 06/14/2019   Procedure: UPPER EXTREMITY ANGIOGRAPHY;  Surgeon: Lorretta Harp, MD;  Location: [facility];  Service: Cardiovascular;  Laterality: Right;  upper ext   HPI:  Ms Holtmeyer is a 55 yo female with PMH HLD, HTN, CAD s/p stent x2 in 2020, T2DM on insulin, h/o PUD and esophagitis, subclavian steal syndrome s/p bypass graft in 2020 who presents with acute encephalopathy, last known well several days ago, and found to be in diabetic ketoacidosis in the ED with BG in 900s and pH 7.1. Per ED notes there was possible report of hematemsis, she does have dried blood on her lips and mouth without signs of laceration or active bleed.   Assessment / Plan / Recommendation Clinical Impression  Patient presents with a functional oropharyngeal swallow with impaired cognition being primary risk factor for aspiration at this time. Patient easily aroused but remained drowsy, with poor sustained attention, eyes closed, and repetitive oral opening/closing throughout exam. Oral strength and symmetry appear WFL. Able to consume thin liquids and pureed solids with timely oral transit and no indication of aspiration. Solids self fed and consumed with seemingly good airway protection but with significantly prolongued oral transit of bolus and periods of oral holding, likely due to poor sustained attention and general lethargy. Recommend initiation of a conservative diet to minimize aspiration risks, advancing with improved mentation. SLP Visit Diagnosis: Dysphagia, unspecified (R13.10)    Aspiration Risk  Moderate aspiration risk    Diet Recommendation Dysphagia 1 (Puree);Thin liquid   Liquid Administration via: Straw;Cup Medication Administration: Whole meds with liquid Supervision: Patient able to self feed;Full supervision/cueing for compensatory strategies;Staff to assist with self feeding Compensations: Slow rate;Small sips/bites Postural Changes: Seated upright at 90 degrees;Remain upright for at least 30 minutes after po intake    Other  Recommendations Oral Care  Recommendations: Oral care BID   Follow up Recommendations None      Frequency and Duration min 1 x/week  1 week       Prognosis Prognosis for Safe Diet Advancement: Good Barriers to Reach Goals: Cognitive deficits      Swallow Study   General HPI: Ms Mamone is a 55 yo female with PMH HLD, HTN, CAD s/p stent x2 in 2020, T2DM on insulin, h/o PUD and esophagitis, subclavian steal syndrome s/p bypass graft in 2020 who presents with acute encephalopathy, last known well several days ago, and found to be in diabetic ketoacidosis in the ED with BG in 900s and pH 7.1. Per ED notes there was possible report of hematemsis, she does have dried blood on her lips and mouth without signs of laceration or active bleed. Type of Study: Bedside Swallow Evaluation Diet Prior to this Study: NPO Temperature Spikes Noted: No Respiratory Status: Room air History of Recent Intubation: No Behavior/Cognition: Lethargic/Drowsy;Requires cueing Oral Cavity Assessment: Dry Oral Care Completed by SLP: Yes Oral Cavity - Dentition: Poor condition;Missing dentition Vision: Functional for self-feeding Self-Feeding Abilities: Able to feed self Patient Positioning: Upright in bed Baseline Vocal Quality: Normal Volitional Cough: Cognitively unable to elicit Volitional Swallow: Able to elicit    Oral/Motor/Sensory Function Overall Oral Motor/Sensory Function: Other (comment) (repetitive mouth opening/closing)   Ice Chips Ice chips: Within functional limits Presentation: Spoon   Thin Liquid Thin Liquid: Within functional limits Presentation: Straw;Self [employer]  Thick Nectar Thick Liquid: Not tested   Honey Thick Honey Thick Liquid: Not tested   Puree Puree: Within functional limits Presentation: Spoon   Solid     Solid: Impaired Presentation: Self Fed Oral Phase Impairments: Impaired mastication Oral Phase Functional Implications: Prolonged oral transit;Oral holding     Kenyatte Chatmon MA,  CCC-SLP   Krystina Strieter Meryl 12/14/2020,9:20 AM

## 2020-12-14 NOTE — Progress Notes (Signed)
Interim Progress Note  S: In to see patient this evening. She is asleep in bed but arouses to painful stimuli stating "that hurts" and "what". She is unable to follow my commands and does not squeeze my fingers or open her eyes.   O:  Blood pressure 129/76, pulse (!) 103, temperature 98.4 F (36.9 C), temperature source Oral, resp. rate 20, height 5\' 3"  (1.6 m), weight 108.9 kg, last menstrual period 08/03/2013, SpO2 94 %. Gen: obese patient asleep in bed, arouses to painful stimuli only Cardio: RRR, without murmur Resp: transmitted upper airway sounds, equal chest rise and fall, no wheezing in anterior fields  Abd: obese, mild tenderness on palpation to LLQ, LUQ and epigastric regions (grimaces), without rebound or guarding, soft, non-distended  Ext: SCD's in place, 2+ radial and DP pulses b/l   A/P:  55 y/o F admitted for AMS and DKA. DKA has resolved and patient is now on antibiotics to cover for meningitis given continued AMS. Several unsuccessful attempts made earlier to contact family (son) for consent for LP. Will proceed forward with continuing antibiotics and defer LP at this time. Troponin's have down-trended. -Keep NPO given altered status -Team to reattempt to reach son in the AM  -Echo  -Continue Acyclovir (5/15-) -Continue Meropenem (5/15-) -Continue Vanc (5/14-) -Additional plan per previous note

## 2020-12-14 NOTE — Plan of Care (Signed)

## 2020-12-14 NOTE — Progress Notes (Signed)
OT Cancellation Note  Patient Details Name: Morgan [name] MRN: [id number] DOB: July 15, 1966   Cancelled Treatment:    Reason Eval/Treat Not Completed: Patient not medically ready, per chart, pt not appropriate due to level of arousal and inability to follow commands. OT will return tomorrow.   [facility] OTR/L Acute Rehabilitation Services Office: [place] 12/14/2020, 2:13 PM

## 2020-12-14 NOTE — Progress Notes (Signed)
**  Late entry**  Spoke with Dr. Lorrin Goodell regarding pt's AMS and possible EEG and/or LP. He recommended starting meningitic dosing of antibiotics. Pt will like need fluro-guided LP given her BMI. Neurology to see.    Spoke with Pharmacy team regarding pt's allergies. Will stop Aztreonam and start Meropenem. Add Acyclovir. Continue Vanc. Discussed adding Flagyl for additional anaerobic coverage given possible pancolitis.   Lyndee Hensen, DO PGY-2, [facility] 12/14/2020 3:40 PM

## 2020-12-14 NOTE — Progress Notes (Signed)
Patient is off floor (2w) for procedure.

## 2020-12-14 NOTE — Progress Notes (Signed)
PT Cancellation Note  Patient Details Name: Morgan [name] MRN: [id number] DOB: [birth year]   Cancelled Treatment:    Reason Eval/Treat Not Completed: Patient's level of consciousness Spoke with RN in room with patient. Patient currently unable to participate due to level of arousal and inability to follow commands. Will attempt comprehensive PT evaluation tomorrow.   Ellouise Newer 12/14/2020, 1:25 PM

## 2020-12-14 NOTE — Progress Notes (Signed)
Received verbal order from Apolonio Schneiders, for pt to go down to MRI without progressive monitor.

## 2020-12-14 NOTE — Hospital Course (Addendum)
This is a 55 year old female with past medical history significant for poorly controlled type 2 insulin-dependent diabetes who presented with altered mental status after being found by EMS dehydrated and vomiting with reports of tarry stool and blood in her emesis per EMS.  Per son, patient was last seen normal 2 days prior.  Encephalopathy On admission, patient was alert and oriented x2 but remainder of history and ROS were limited due to patient's condition. Initial work-up significant for negative CT head (but with small  remote cerberallar infarct not seen two weeks prior when seen in ED for dizziness and multiple falls 11/21/20), negative chest x-ray, normal ammonia, UA negative, UDS positive for amphetamines.  Thought to be due to DKA.  Though due to continued but improved alteration, MRI obtained on day 2 of hospitalization, which showed ***   DKA Patient started on insulin drip and ultimately transition to subcutaneous insulin on the evening of admission.  Her electrolyte abnormalities were corrected.   Concern for hematemasis, + FOBT  On admission, EMS noted possible tarry stool and hematemesis.  Her admission hemoglobin was normal but was still started on IV Protonix twice daily.  She had an EGD in 2019 that did not show any varices, though she does have a known history of PUD.  Her CBC was trended and showed ***.   Given her altered mental status, medication reconciliation was unable to be done.  Prior to discharge, medication reconciliation was done*** and she was discharged on appropriate medicaitons and medication list updated.  See discharge summary.  Patient needs close outpatient follow-up.  - Follow up with heme/onc   - Ensure consistent outpatient psych follow up

## 2020-12-14 NOTE — Progress Notes (Signed)
Have attempted to call patient's son at number listed in chart, [phone].  Also called patient's cell number listed in case this was with her son.  So answer at any number.  Unable to leave VM, as rings then disconnects afterwards without option to leave VM.  Also searched chart for prior documentation of son's number, but the only number listed is the one previously mentioned.  Patient's son Jeneen Rinks was also called by radiology for consent for LP, but no answer.

## 2020-12-14 NOTE — Progress Notes (Signed)
Pharmacy Antibiotic Note  Morgan [name] is a 55 y.o. female admitted on 12/13/2020 with sepsis.  Pharmacy has been consulted for Aztreonam and vancomycin dosing. This is day 2 of previously mentioned antimicrobial therapies.   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   WBC elevated at 23.9, afebrile  Scr = 0.84 (crCl 88.49ml/min)  Plan: - Initiate acyclovir 750mg  IV q8h  - Continue IV fluids at 19ml/hr for hydration with acyclovir - Initiate meropenem 2g IV q8h for to provide Listeria and broad spectrum coverage, also covers anaerobes for possible anaerobic involvement in gut colitis  - Discontinue aztreonam  -Continue at current dose - vancomycin 1250 mg IV Q 12 hrs. Goal AUC 400-550. Expected AUC: 502.3, SCr used: 0.7 -Monitor CBC, renal fx, cultures and clinical progress -Vanc levels at steady state    Height: 5\' 3"  (160 cm) Weight: 108.9 kg (240 lb 1.3 oz) IBW/kg (Calculated) : 52.4  Temp (24hrs), Avg:98.3 F (36.8 C), Min:97.6 F (36.4 C), Max:98.8 F (37.1 C)  Recent Labs  Lab 12/13/20 0412 12/13/20 0424 12/13/20 0500 12/13/20 0915 12/13/20 0925 12/13/20 0947 12/13/20 1036 12/13/20 1122 12/13/20 1543 12/13/20 1943 12/14/20 0337 12/14/20 0850 12/14/20 1115  WBC 20.8*  --   --  DUP SEE [id number] 22.6*  --  24.7*  --   --   --   --  23.9*  --   CREATININE 2.48*  --    < >  --   --    < >  --  1.65* 1.32* 1.04* 1.09*  --  0.84  LATICACIDVEN  --  1.9  --   --   --   --   --   --   --   --   --   --   --    < > = values in this interval not displayed.    Estimated Creatinine Clearance: 88.5 mL/min (by C-G formula based on SCr of 0.84 mg/dL).    Allergies  Allergen Reactions  . Ilosone [Erythromycin] Anaphylaxis  . Keflex [Cephalexin] Anaphylaxis  . Penicillins  Anaphylaxis    Has patient had a PCN reaction causing immediate rash, facial/tongue/throat swelling, SOB or lightheadedness with hypotension: YES Has patient had a PCN reaction causing severe rash involving mucus membranes or skin necrosis: NO Has patient had a PCN reaction that required hospitalizationNO Has patient had a PCN reaction occurring within the last 10 years: NO If all of the above answers are "NO", then may proceed with Cephalosporin use.  . Iron Other (See Comments)    Pt reports condition limiting Iron intake.  . Metformin And Related Other (See Comments)    UNSPECIFIED REACTION  Pt prefers to not take this medication Side effects  . Morphine And Related Other (See Comments)    Pt reports hallucinations    Antimicrobials this admission: Aztreonam 5/14 >> 5/15 Vanc 5/14 >>  Acyclovir 5/15> Merrem 5/15>  Dose adjustments this admission:   Microbiology results: 5/14 BCx: no growth 1 day    Thank you for allowing pharmacy to be a part of this patient's care.  Wilson Singer, PharmD PGY1 Pharmacy Resident 12/14/2020 3:06 PM

## 2020-12-14 NOTE — Progress Notes (Addendum)
Family Medicine Teaching Service Daily Progress Note Intern Pager: [phone]  Patient name: Morgan [name] Medical record number: [id number] Date of birth: Apr 08, 1966 Age: 55 y.o. Gender: female  Primary Care Provider: [name], DO Consultants: IP CONSULT TO FAMILY PRACTICE VANCOMYCIN PER PHARMACY CONSULT AZTREONAM (AZACTAM) PER PHARMACYCONSULT CONSULT TO DIABETES COORDINATOR Code Status: Full Code   Pt Overview and Major Events to Date:  Hospital Day: 2 12/13/2020: admitted for Hyperglycemia and Altered Mental Status  Abx 5/14 (1100) - Aztreonam q 8 hours  5/14 (1100) - Vancomycin q 12 hours   Assessment and Plan: Morgan [name] is a 55 y.o. female who presented w/ altered mental status and DKA.  Past medical history significant for type 2 diabetes and previous admissions for DKA, PUD and esophagitis, chronic leukocytosis, CVA, NSTEMI, CAD status post stent, hypertension, hypothyroidism, bipolar, anxiety.   DKA Patient transition to subcutaneous insulin yesterday evening.  Calculated insulin requirement is 50 units Lantus with 16 units 3 times daily with meals and additional sliding scale.  Patient's mental status started to improve yesterday evening and was requesting food around 11 PM, but ultimately patient did not eat. Per RN this AM, request for formal SLP given her continued decreased altered mentation. BMP this AM with gap closed. Last CBG 312  . SLP  . CBG + modSSI q4 hours  o Transition to meal time dosing (16 units + mod SSI) when PO  . Repeat BMP 1100  . PT/OT   Type II DM, poorly controlled  Home meds include as of 09/18/20.  - Lantus 55u AM 09:30 AM; Lantus 55u 09:30 PM             - Humalog 35u AM with breakfast             - Humalog 35u with a proper lunch; only 10 units with a snack             - Humalog 35u for dinner/at night; take only 10 units with a snack  . Diabetes coordinator  . See DKA A/P  . Close o/p f/u, CCM candidate   Electrolyte  Abnormalities  Hypokalemia, resolved Hypernatremia, improving  Phos 2.1 yesterday evening.  . Follow up repeat add on Phos, if low, replete and recheck after repletion  . Repeat BMP 1100  . 1/2 NS at 125. Can switch to LR or NS if stable Na on repeat BMP if continued NPO   Encephalopathy  Seems to be improving, though not back at baseline this morning. GCS 15 . More alert this morning, but continued continued difficulty/effort following commands. Unable to get reliable neuro exam this morning. She was oriented to self, place (states [facility]), and year last night, but only oriented to self and not specific hospital. She reports that she doesn't live with anyone. I asked about contacting her son, and she was okay with [place] reaching out, but seemed more or less aloof about getting in contact with him.  Work up thus far includes: negative CT head (but with small  remote cerberallar infarct not seen two weeks prior when seen in ED for dizziness and multiple falls 11/21/20), negative CXR, normal ammonia, UA, blood cultures neg, UDS + amphetamines, other tox negative. Possibly due to DKA, though given continued alteration this morning, will get MRI brain.   Incomplete med rec. Attempted to call son this morning without success. Restart medications when PO. Consult pharmacy for last fills.  [name] PT/OT  . SLP as  above  . MRI brain  . Try to contact son   Chronic Leukocytosis  Acute neutrophilia? Polymorphic with outpatient work up in February. Waiting on CBC w/ diff from this morning, as left shit on admission. No other objective signs of infection at this time. Vancomycin and aztreonam started empirically on admission.  . Follow up CBC w/ diff from this AM  . Likely needs heme/onc follow up . If blood cultures negative x 24 hours, would strongly consider d/cing abx   Tachycardia Unclear cause. Outpatient visits show consistently normal HR. Med rec with metoprolol at home, could be rebound  tachycardia vs. Dehydration from DKA. Less likely acute blood loss given stable hemoglobin and other vital signs. Troponin up trending yesterday, repeating today.  .  Treat with fluids . Continue cardiac monitoring  . Follow up troponin   Concern for hematemesis  +FOBT  Hx of PUD  Concern for hematemesis & tarry stool per EMS. Dried blood in nose and mouth with positive FOBT.  Last EGD in 2019 without esophageal varices.  Her hemoglobin is stable this morning. Consider mallory weiss tear in setting of emesis with DKA? Med rec: home protonix 40 mg BID  . Hold meds until med rec/AMS  . Continue IV protonix 40 mg BID  . Re-evaluated for VTE ppx   Urinary Retention Bladder scans w/ straight cath yesterday. Foley placed on 12/13/20 PM.  . Continue eval for need for foley cath  . Cath care   Amphetamine use  . Consult CSW    HTN CAD s/p stent 139/87 this morning  EKG normal. Troponin yesterday 14>51.  Med rec: ASA 81, atorvastatin 80 mg, Metoprolol 50 mg BID (last ordered 08/19/20).  . Follow up repeat this AM   Hold meds until med rec/AMS   Continue cardiac monitoring   Hypothyroidism  TSH wnl.  Med rec: Synthroid 150 mcg daily (ordered on 10/16/20 but never dispensed)   Hold meds until med rec/AMS   Bipolar Disorder  Med rec: lamictal 200 mg daily, topamax 75 mg, trazadone 50 mg, venlafaxine XR 150 mg daily, abilify 5 mg daily. All but venlafaxine marked as ordered on 10/16/20 but never dispensed on med rec.   Hold meds until med rec/AMS   Migraines Med rec: sumatriptan   FEN/GI: . Fluids: 1/2 NS  . Electrolytes: replete PRN   . Nutrition: NPO until SLP eval    Status is: Inpatient  Remains inpatient appropriate because:Altered mental status, Ongoing diagnostic testing needed not appropriate for outpatient work up, Unsafe d/c plan, IV treatments appropriate due to intensity of illness or inability to take PO and Inpatient level of care appropriate due to severity of  illness   Dispo: The patient is from: Home              Anticipated d/c is to: TBD              Patient currently is not medically stable to d/c.   Difficult to place patient No   Subjective:  NAEO.   Objective: Temp:  [97.6 F (36.4 C)-98.7 F (37.1 C)] 98.6 F (37 C) (05/15 0745) Pulse Rate:  [108-116] 110 (05/15 0745) Cardiac Rhythm: Sinus tachycardia (05/15 0700) Resp:  [15-20] 20 (05/15 0745) BP: (107-139)/(74-92) 139/87 (05/15 0745) SpO2:  [93 %-96 %] 93 % (05/15 0745) Intake/Output      05/14 0701 05/15 0700 05/15 0701 05/16 0700   I.V. (mL/kg) 250 (2.3)    IV Piggyback 2100  Total Intake(mL/kg) 2350 (21.6)    Urine (mL/kg/hr) 2500 (1)    Stool 1    Total Output 2501    Net -151             Physical Exam: General: NAD, non-toxic. Sitting up in bed working with SLP. Sits with her eyes closed for most of exam but can open on command.   HEENT: [place]/AT. Oral exam improved with no tongue lesions.   Cardiovascular: RRR, normal S1, S2. B/L 2+ RP. No BLEE Respiratory: CTAB. No IWOB.  Abdomen: + BS. NT, ND, soft to palpation.  Extremities: Warm and well perfused. Moving spontaneously. LUE mildly edematous with multiple IV lines and multiple coban strips.  Integumentary: No obvious rashes, lesions, trauma on general exam. Neuro: A & O x1. CN grossly intact. No FND, though exam limited due to patient effort vs. Altered mentation. Answering about 50% of questions, does answer with repetition of some questions.   Laboratory: I have personally read and reviewed all labs and imaging studies.  CBC: Recent Labs  Lab 12/13/20 0915 12/13/20 0925 12/13/20 0947 12/13/20 1036  WBC DUP SEE S51500 22.6*  --  24.7*  NEUTROABS PENDING 19.4*  --   --   HGB DUP SEE S51500 13.4 9.9* 13.3  HCT DUP SEE S51500 46.3* 29.0* 45.8  MCV DUP SEE S51500 68.1*  --  67.6*  PLT DUP SEE S51500 248  --  252   CMP: Recent Labs  Lab 12/13/20 0412 12/13/20 0500 12/13/20 0915 12/13/20 0947  12/13/20 1543 12/13/20 1943 12/14/20 0337  NA 135   < >  --    < > 144 146* 142  K 4.3   < >  --    < > 3.1* 3.0* 4.3  CL 96*   < >  --    < > 118* 121* 114*  CO2 <7*   < >  --    < > 14* 18* 15*  GLUCOSE 924*   < >  --    < > 338* 220* 407*  BUN 77*   < >  --    < > 44* 37* 27*  CREATININE 2.48*   < >  --    < > 1.32* 1.04* 1.09*  CALCIUM 8.2*   < >  --    < > 7.9* 8.2* 8.0*  MG  --   --  2.4  --  2.2 2.4  --   PHOS  --   --  4.1  --  2.0* 2.1*  --   ALBUMIN 3.7  --   --   --   --   --  2.9*   < > = values in this interval not displayed.   CBG: Recent Labs  Lab 12/13/20 1808 12/13/20 1902 12/13/20 2051 12/13/20 2222 12/14/20 0616  GLUCAP 216* 208* 173* 231* 312*   Micro: Covid Negative  Imaging/Diagnostic Tests: DG Chest 1 View  Result Date: 12/13/2020 CLINICAL DATA:  Hematemesis EXAM: CHEST  1 VIEW COMPARISON:  07/08/2020 FINDINGS: Low volume chest with chronically elevated left diaphragm and a overlying scar. No pulmonary edema, air bronchogram, effusion, or pneumothorax. Normal heart size. Clips at the left supraclavicular fossa which may relate to history of subclavian steal syndrome. Coronary stenting. IMPRESSION: 1. No acute finding. 2. Chronic elevation of the left diaphragm with overlying scar. Electronically Signed   By: Monte Fantasia M.D.   On: 12/13/2020 07:06   DG Abd 1 View  Result Date: 12/13/2020  CLINICAL DATA:  Dehydration and vomiting. EXAM: ABDOMEN - 1 VIEW COMPARISON:  11/24/2019 abdominal CT FINDINGS: Normal bowel gas pattern. No abnormal stool retention. Elevated left diaphragm which is chronic based on prior imaging. Cholecystectomy clips. IMPRESSION: 1. Normal bowel gas pattern. 2. Chronically elevated left diaphragm. Electronically Signed   By: Monte Fantasia M.D.   On: 12/13/2020 07:05   CT Head Wo Contrast  Result Date: 12/13/2020 CLINICAL DATA:  Mental status change with unknown cause. Hyperglycemia EXAM: CT HEAD WITHOUT CONTRAST TECHNIQUE:  Contiguous axial images were obtained from the base of the skull through the vertex without intravenous contrast. COMPARISON:  11/21/2020 FINDINGS: Brain: No evidence of acute infarction, hemorrhage, hydrocephalus, extra-axial collection or mass lesion/mass effect. Small remote left cerebellar infarct. Patchy areas of low-density have a band like morphology on reformats and are attributed to artifact. Vascular: No hyperdense vessel or unexpected calcification. Skull: Normal. Negative for fracture or focal lesion. Sinuses/Orbits: No acute finding. IMPRESSION: 1. Stable compared to prior.  No acute finding. 2. Small remote left cerebellar infarct. Electronically Signed   By: Monte Fantasia M.D.   On: 12/13/2020 07:08   CT ABDOMEN PELVIS W CONTRAST  Result Date: 12/13/2020 CLINICAL DATA:  Evaluate for abdominal abscess/infection. Elevated white blood cell count. EXAM: CT ABDOMEN AND PELVIS WITH CONTRAST TECHNIQUE: Multidetector CT imaging of the abdomen and pelvis was performed using the standard protocol following bolus administration of intravenous contrast. CONTRAST:  50mL OMNIPAQUE IOHEXOL 300 MG/ML  SOLN COMPARISON:  11/24/2019 FINDINGS: Lower chest: Atelectasis and volume loss is identified within the left lower lobe and lingula. Perifissural nodule is noted along the major fissure measuring 5 mm. Dependent changes are identified within the right base. Hepatobiliary: Tiny low-density structure along the dome of liver measures 5 mm and is unchanged from previous exam. Focal area of fatty deposition is again noted within segment 5 adjacent to the gallbladder fossa. No suspicious liver lesion identified. Cholecystectomy. No signs of bile duct dilatation. Pancreas: Unremarkable. No pancreatic ductal dilatation or surrounding inflammatory changes. Spleen: Normal in size without focal abnormality. Adrenals/Urinary Tract: Adreniform enlargement of the left adrenal gland appears stable from prior exam. Right adrenal  gland appears normal. No nephrolithiasis, hydronephrosis or mass identified within the kidneys. The urinary bladder is unremarkable for degree of distension. Stomach/Bowel: Stomach appears normal. There is no small bowel wall thickening, inflammation or distension. The appendix is visualized and appears normal. Mild colonic wall thickening is identified. This is nonspecific and may reflect incomplete distention. Uncomplicated pancolitis cannot be excluded. No signs of pneumatosis, bowel perforation or abscess. Vascular/Lymphatic: Aortic atherosclerosis. No aneurysm. No abdominopelvic adenopathy. Reproductive: Uterus and bilateral adnexa are unremarkable. Other: No free fluid or fluid collections. Musculoskeletal: No acute or significant osseous findings. IMPRESSION: 1. No signs of intra-abdominal or pelvic abscess. No focal fluid collections or free fluid noted. 2. Equivocal colonic wall thickening is identified. This is nonspecific and may reflect incomplete distention. Uncomplicated pancolitis cannot be excluded. No signs of pneumatosis, bowel perforation or abscess. Clinical correlation advised. Aortic Atherosclerosis (ICD10-I70.0). Electronically Signed   By: Kerby Moors M.D.   On: 12/13/2020 11:30    Procedures:   Procedure Orders     1-3 Lead EKG Interpretation     Critical Care     EKG 12-Lead  Wilber Oliphant, MD 12/14/2020, 7:59 AM PGY-3, [place] Intern pager: [phone], text pages welcome

## 2020-12-15 ENCOUNTER — Inpatient Hospital Stay (HOSPITAL_COMMUNITY): Payer: Managed Care, Other (non HMO)

## 2020-12-15 DIAGNOSIS — R778 Other specified abnormalities of plasma proteins: Secondary | ICD-10-CM

## 2020-12-15 DIAGNOSIS — E1110 Type 2 diabetes mellitus with ketoacidosis without coma: Secondary | ICD-10-CM | POA: Diagnosis not present

## 2020-12-15 DIAGNOSIS — R4182 Altered mental status, unspecified: Secondary | ICD-10-CM

## 2020-12-15 DIAGNOSIS — R400 Somnolence: Secondary | ICD-10-CM

## 2020-12-15 DIAGNOSIS — R9431 Abnormal electrocardiogram [ECG] [EKG]: Secondary | ICD-10-CM | POA: Diagnosis not present

## 2020-12-15 LAB — BASIC METABOLIC PANEL
Anion gap: 8 (ref 5–15)
Anion gap: 6 (ref 5–15)
BUN: 15 mg/dL (ref 6–20)
BUN: 9 mg/dL (ref 6–20)
CO2: 21 mmol/L — ABNORMAL LOW (ref 22–32)
CO2: 24 mmol/L (ref 22–32)
Calcium: 8.3 mg/dL — ABNORMAL LOW (ref 8.9–10.3)
Calcium: 8.3 mg/dL — ABNORMAL LOW (ref 8.9–10.3)
Chloride: 117 mmol/L — ABNORMAL HIGH (ref 98–111)
Chloride: 117 mmol/L — ABNORMAL HIGH (ref 98–111)
Creatinine, Ser: 0.71 mg/dL (ref 0.44–1.00)
Creatinine, Ser: 0.58 mg/dL (ref 0.44–1.00)
GFR, Estimated: >60 mL/min (ref >60)
GFR, Estimated: >60 mL/min (ref >60)
Glucose, Bld: 155 mg/dL — ABNORMAL HIGH (ref 70–99)
Glucose, Bld: 122 mg/dL — ABNORMAL HIGH (ref 70–99)
Potassium: 4.7 mmol/L (ref 3.5–5.1)
Potassium: 2.8 mmol/L — ABNORMAL LOW (ref 3.5–5.1)
Sodium: 146 mmol/L — ABNORMAL HIGH (ref 135–145)
Sodium: 147 mmol/L — ABNORMAL HIGH (ref 135–145)

## 2020-12-15 LAB — CBC WITH DIFFERENTIAL/PLATELET
Abs Immature Granulocytes: 0.09 10*3/uL — ABNORMAL HIGH (ref 0.00–0.07)
Basophils Absolute: 0 10*3/uL (ref 0.0–0.1)
Basophils Relative: 0 %
Eosinophils Absolute: 0 10*3/uL (ref 0.0–0.5)
Eosinophils Relative: 0 %
HCT: 44 % (ref 36.0–46.0)
Hemoglobin: 13.4 g/dL (ref 12.0–15.0)
Immature Granulocytes: 1 %
Lymphocytes Relative: 17 %
Lymphs Abs: 2.5 10*3/uL (ref 0.7–4.0)
MCH: 19.7 pg — ABNORMAL LOW (ref 26.0–34.0)
MCHC: 30.5 g/dL (ref 30.0–36.0)
MCV: 64.8 fL — ABNORMAL LOW (ref 80.0–100.0)
Monocytes Absolute: 1.1 10*3/uL — ABNORMAL HIGH (ref 0.1–1.0)
Monocytes Relative: 7 %
Neutro Abs: 10.9 10*3/uL — ABNORMAL HIGH (ref 1.7–7.7)
Neutrophils Relative %: 75 %
Platelets: UNDETERMINED 10*3/uL (ref 150–400)
RBC: 6.79 MIL/uL — ABNORMAL HIGH (ref 3.87–5.11)
RDW: 18.2 % — ABNORMAL HIGH (ref 11.5–15.5)
WBC: 14.6 10*3/uL — ABNORMAL HIGH (ref 4.0–10.5)
nRBC: 0 % (ref 0.0–0.2)

## 2020-12-15 LAB — ECHOCARDIOGRAM COMPLETE
AR max vel: 1.6 cm2
AV Area VTI: 1.54 cm2
AV Area mean vel: 1.54 cm2
AV Mean grad: 8 mmHg
AV Peak grad: 16.5 mmHg
Ao pk vel: 2.03 m/s
Area-P 1/2: 4.39 cm2
Height: 63 in
S' Lateral: 2.35 cm
Weight: 3668.45 oz

## 2020-12-15 LAB — GLUCOSE, CAPILLARY
Glucose-Capillary: 392 mg/dL — ABNORMAL HIGH (ref 70–99)
Glucose-Capillary: 180 mg/dL — ABNORMAL HIGH (ref 70–99)
Glucose-Capillary: 188 mg/dL — ABNORMAL HIGH (ref 70–99)
Glucose-Capillary: 145 mg/dL — ABNORMAL HIGH (ref 70–99)
Glucose-Capillary: 130 mg/dL — ABNORMAL HIGH (ref 70–99)
Glucose-Capillary: 268 mg/dL — ABNORMAL HIGH (ref 70–99)

## 2020-12-15 LAB — PLATELET BY CITRATE: Platelet CT in Citrate: 158

## 2020-12-15 MED ORDER — POTASSIUM CHLORIDE 10 MEQ/100ML IV SOLN: 10.0000 meq | INTRAVENOUS | Status: DC

## 2020-12-15 MED ORDER — POTASSIUM CHLORIDE 20 MEQ PO PACK
80.0000 meq | PACK | Freq: Once | ORAL | Status: AC
  Administered 2020-12-15: 80 meq via ORAL
  Filled 2020-12-15: qty 4

## 2020-12-15 MED ORDER — SODIUM CHLORIDE 0.9 % IV SOLN: INTRAVENOUS | Status: AC

## 2020-12-15 MED ORDER — SODIUM CHLORIDE 0.9 % IV SOLN
2.0000 g | Freq: Three times a day (TID) | INTRAVENOUS | Status: DC
  Administered 2020-12-15: 2 g via INTRAVENOUS
  Filled 2020-12-15 (×3): qty 2

## 2020-12-15 MED ORDER — ENOXAPARIN SODIUM 40 MG/0.4ML IJ SOSY
40.0000 mg | PREFILLED_SYRINGE | INTRAMUSCULAR | Status: DC
  Administered 2020-12-15 – 2020-12-18 (×4): 40 mg via SUBCUTANEOUS
  Filled 2020-12-15 (×4): qty 0.4

## 2020-12-15 MED ORDER — INSULIN GLARGINE 100 UNIT/ML ~~LOC~~ SOLN
40.0000 [IU] | Freq: Every day | SUBCUTANEOUS | Status: DC
  Administered 2020-12-15 – 2020-12-19 (×4): 40 [IU] via SUBCUTANEOUS
  Filled 2020-12-15 (×5): qty 0.4

## 2020-12-15 MED ORDER — VANCOMYCIN HCL 1500 MG/300ML IV SOLN
1500.0000 mg | Freq: Two times a day (BID) | INTRAVENOUS | Status: DC
  Filled 2020-12-15: qty 300
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
## 2020-12-15 NOTE — Procedures (Addendum)
Patient Name: Morgan [name]  MRN: [id number]  Epilepsy Attending: Lora Havens  Referring Physician/Provider: Dr Lyndee Hensen Date: 12/15/2020 Duration: 23.44 mins  Patient history: 55yo F with AMS. EEG to evaluate for seizure.   Level of alertness: awake, asleep  AEDs during EEG study: None  Technical aspects: This EEG study was done with scalp electrodes positioned according to the 10-20 International system of electrode placement. Electrical activity was acquired at a sampling rate of 500Hz  and reviewed with a high frequency filter of 70Hz  and a low frequency filter of 1Hz . EEG data were recorded continuously and digitally stored.   Description: No clear posterior dominant rhythm was seen. EEG showed continuous generalized polymorphic mixed frequencies 3 to 6 Hz theta-delta slowing. Sleep was characterized by sleep spindles (12-14hz ), maximal frontocentral region. Hyperventilation and photic stimulation were not performed.     ABNORMALITY - Continuous slow, generalized  IMPRESSION: This study is suggestive of moderate to severe diffuse encephalopathy, nonspecific etiology. No seizures or epileptiform discharges were seen throughout the recording.  [place] Barbra Sarks

## 2020-12-15 NOTE — Progress Notes (Incomplete)
  Echocardiogram 2D Echocardiogram has been performed.  [name] 12/15/2020, 12:57 PM

## 2020-12-15 NOTE — Progress Notes (Signed)
Neurology Progress Note   S:// Seen and examined.  No acute changes.  More verbal per report.   O:// Current vital signs: BP 138/90 (BP Location: Left Arm)   Pulse (!) 108   Temp 98.6 F (37 C) (Oral)   Resp 15   Ht 5\' 3"  (1.6 m)   Wt 104 kg   LMP 08/03/2013   SpO2 93%   BMI 40.61 kg/m  Vital signs in last 24 hours: Temp:  [98.2 F (36.8 C)-98.6 F (37 C)] 98.6 F (37 C) (05/16 0738) Pulse Rate:  [100-108] 108 (05/16 0738) Resp:  [15-20] 15 (05/16 0738) BP: (124-147)/(69-90) 138/90 (05/16 0738) SpO2:  [93 %-95 %] 93 % (05/16 0738) Weight:  [104 kg] 104 kg (05/16 0428) General: Awake alert in no distress HEENT: Cephalic atraumatic Lungs: Breathing well saturating normally on room air, no rales CVS: Regular rate rhythm Extremities warm well perfused with trace edema Neurological exam She is awake, alert, oriented to self. She is able to tell me the year but cannot tell me the month. She is able to follow commands She remains awake for most part and exam looks a little better than what it was last night when my colleague Dr. Leonel Ramsay saw her. She is able to name simple objects but perseverates. She has poor attention concentration Cranial nerve examination: Pupils are equal round react light, she is able to gaze in all directions without restriction, blinks to threat from all sides, face appears symmetric, tongue and palate midline. Motor examination: She moves both upper extremities against gravity but both of them come down to the bed before 10 seconds as she does not follow commands-I do not think she has any focal weakness in the upper extremities.  She also has at least antigravity strength in both her lower extremities and again due to her inattention, lets him down to bed before 5 seconds. Sensory exam: Intact to touch all over-complains of hyperesthesia in bilateral lower extremities Coordination: Difficult to assess given her mentation  Medications  Current  Facility-Administered Medications:  .  0.9 %  sodium chloride infusion, , Intravenous, Continuous, Carollee Leitz, MD .  aztreonam (AZACTAM) 2 g in sodium chloride 0.9 % 100 mL IVPB, 2 g, Intravenous, Q8H, Donnamae Jude, RPH .  Chlorhexidine Gluconate Cloth 2 % PADS 6 each, 6 each, Topical, Daily, Wilber Oliphant, MD, 6 each at 12/15/20 1027 .  dextrose 50 % solution 0-50 mL, 0-50 mL, Intravenous, PRN, Paige, Victoria J, DO .  insulin aspart (novoLOG) injection 0-15 Units, 0-15 Units, Subcutaneous, Q4H, Wilber Oliphant, MD, 3 Units at 12/15/20 [phone] .  insulin glargine (LANTUS) injection 40 Units, 40 Units, Subcutaneous, QHS, Cresenzo, Victor, MD .  pantoprazole (PROTONIX) injection 40 mg, 40 mg, Intravenous, Q12H, Lenoria Chime, MD, 40 mg at 12/14/20 2306 Labs CBC    Component Value Date/Time   WBC 14.6 (H) 12/15/2020 0216   RBC 6.79 (H) 12/15/2020 0216   HGB 13.4 12/15/2020 0216   HGB 12.4 09/26/2020 1540   HCT 44.0 12/15/2020 0216   HCT 40.4 09/26/2020 1540   PLT PLATELET CLUMPS NOTED ON SMEAR, UNABLE TO ESTIMATE 12/15/2020 0216   PLT 362 09/26/2020 1540   MCV 64.8 (L) 12/15/2020 0216   MCV 64 (L) 09/26/2020 1540   MCH 19.7 (L) 12/15/2020 0216   MCHC 30.5 12/15/2020 0216   RDW 18.2 (H) 12/15/2020 0216   RDW 20.1 (H) 09/26/2020 1540   LYMPHSABS 2.5 12/15/2020 0216   LYMPHSABS 3.7 (  H) 09/26/2020 1540   MONOABS 1.1 (H) 12/15/2020 0216   EOSABS 0.0 12/15/2020 0216   EOSABS 0.5 (H) 09/26/2020 1540   BASOSABS 0.0 12/15/2020 0216   BASOSABS 0.1 09/26/2020 1540    CMP     Component Value Date/Time   NA 146 (H) 12/15/2020 0216   NA 147 (H) 09/12/2020 1425   K 4.7 12/15/2020 0216   CL 117 (H) 12/15/2020 0216   CO2 21 (L) 12/15/2020 0216   GLUCOSE 155 (H) 12/15/2020 0216   BUN 15 12/15/2020 0216   BUN 10 09/12/2020 1425   CREATININE 0.71 12/15/2020 0216   CALCIUM 8.3 (L) 12/15/2020 0216   PROT 5.6 (L) 12/14/2020 0337   PROT 6.6 09/12/2020 1425   ALBUMIN 2.9 (L) 12/14/2020 0337    ALBUMIN 4.1 09/12/2020 1425   AST 14 (L) 12/14/2020 0337   ALT 15 12/14/2020 0337   ALKPHOS 76 12/14/2020 0337   BILITOT 1.0 12/14/2020 0337   BILITOT 0.2 09/12/2020 1425   GFRNONAA >60 12/15/2020 0216   GFRAA 117 09/12/2020 1425  Routine EEG suggestive of moderate to severe diffuse encephalopathy  Imaging I have reviewed images in epic and the results pertinent to this consultation are: Motion riddled MRI of the brain with negative diffusion.  Assessment:  55 year old with acute encephalopathy in the setting of severe hyperglycemia with DKA- concern for ensuing encephalopathy even with improvement in her sugars. Concern for underlying CNS infection for which she was started on meningitic coverage due to the report of headache but given her clinical course, suspicion for CNS infection is very less. I would recommend stopping empiric antibiotics and not pursuing an LP at this time. Recovery of her mentation would likely lag behind the lab recovery given her comorbidities.  Toxic metabolic encephalopathy in the setting of DKA Low suspicion for CNS infection  Recommendations:  Given improvement in exam, no LP for now.  Discontinue empiric meningitic coverage.  No evidence of seizures on the EEG.  Continue medical management of her hyperglycemia per primary team as you are.  I would expect her to make improvements over the next few days as her electrolyte and metabolic derangements start to level out. Discussed my plan with the family medicine service resident physician at bedside. Neurology will be available as needed-please call with questions.   -- Amie Portland, MD Neurologist [facility] Pager: [phone]

## 2020-12-15 NOTE — Progress Notes (Signed)
SLP Cancellation Note  Patient Details Name: Morgan [name] MRN: [id number] DOB: Nov 03, 1965   Cancelled treatment:       Reason Eval/Treat Not Completed: Other (comment) (Pt is currently NPO and pt's nurse indicated that she is NPO for possible procedure today pending consent. SLP will f/u on subsequent date unless contacted sooner by staff.)  Tobie Poet I. Hardin Negus, [place], [place] Office number [phone] Pager [place] 12/15/2020, 9:11 AM
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
## 2020-12-15 NOTE — Progress Notes (Signed)
EEG complete - results pending 

## 2020-12-15 NOTE — Progress Notes (Signed)
Pharmacy Antibiotic Note  Morgan [name] is a 55 y.o. female admitted on 12/13/2020 with acute encephalopathy, chronic leukocytosis. Pt previously on IV acyclovir, meropenem, and vanc for bacterial meningitis coverage; no longer suspected warranting d/c of empiric abx coverage. Pharmacy has been consulted for Aztreonam dosing in setting of multiple allergies (PCN, cephalosporins reaction of anaphylaxis - with no documented history of tolerance)  WBC trending down 23.9 > 14.6, afebrile  Scr = 0.71 (crCl 90.5 ml/min) CT abdomen showed possible pancolitis   Plan: - D/c acyclovir, meropenem, and vanc given low suspicion of bacterial meningitis - Start aztreonam 2g IV q8h  -Monitor CBC, renal fx, cultures and clinical progress - f/u end date    Height: 5\' 3"  (160 cm) Weight: 104 kg (229 lb 4.5 oz) IBW/kg (Calculated) : 52.4  Temp (24hrs), Avg:98.3 F (36.8 C), Min:97.6 F (36.4 C), Max:98.6 F (37 C)  Recent Labs  Lab 12/13/20 0424 12/13/20 0500 12/13/20 0915 12/13/20 0925 12/13/20 0947 12/13/20 1036 12/13/20 1122 12/13/20 1543 12/13/20 1943 12/14/20 0337 12/14/20 0850 12/14/20 1115 12/15/20 0216  WBC  --   --  DUP SEE [id number] 22.6*  --  24.7*  --   --   --   --  23.9*  --  14.6*  CREATININE  --    < >  --   --    < >  --    < > 1.32* 1.04* 1.09*  --  0.84 0.71  LATICACIDVEN 1.9  --   --   --   --   --   --   --   --   --   --   --   --    < > = values in this interval not displayed.    Estimated Creatinine Clearance: 90.5 mL/min (by C-G formula based on SCr of 0.71 mg/dL).    Allergies  Allergen Reactions  . Ilosone [Erythromycin] Anaphylaxis  . Keflex [Cephalexin] Anaphylaxis  . Penicillins Anaphylaxis    Has patient had a PCN reaction causing immediate rash, facial/tongue/throat swelling, SOB or lightheadedness with hypotension: YES Has patient had a PCN reaction causing severe rash involving mucus membranes or skin necrosis: NO Has patient had a PCN reaction that  required hospitalizationNO Has patient had a PCN reaction occurring within the last 10 years: NO If all of the above answers are "NO", then may proceed with Cephalosporin use.  . Iron Other (See Comments)    Pt reports condition limiting Iron intake.  . Metformin And Related Other (See Comments)    UNSPECIFIED REACTION  Pt prefers to not take this medication Side effects  . Morphine And Related Other (See Comments)    Pt reports hallucinations    Antimicrobials this admission: Aztreonam 5/14 >> 5/15; restarted 5/16 >> Vanc 5/14 >> 5/16 Acyclovir 5/15>> 5/16 Merrem 5/15>> 5/16  Dose adjustments this admission:   Microbiology results: 5/14 BCx: no growth 1 day   Debria Garret, Student Pharmacist 12/15/2020 12:38 PM

## 2020-12-15 NOTE — Progress Notes (Signed)
PT Cancellation Note  Patient Details Name: Morgan [name] MRN: [id number] DOB: 16-Jul-1966   Cancelled Treatment:    Reason Eval/Treat Not Completed: Patient at procedure or test/unavailable  Attempted to see in a.m. and pt undergoing EEG; currently with nursing care. Will reattempt 5/17 (also noted she was very lethargic with OT this pm).    [name], PT Pager [phone]   [name] 12/15/2020, 3:44 PM

## 2020-12-15 NOTE — Consult Note (Signed)
Neurology Consultation Reason for Consult: AMS Referring Physician: Pray, M  CC: AMS  History is obtained from: patient  HPI: Morgan [name] is a 55 y.o. female with a history of diabetes, hypertension, hyperlipidemia, hypothyroidism who presented on 5/14 with encephalopathy.  She was found to be in DKA in the ED with blood glucose in the 900s and a pH of 7.1.  She was confused and was admitted for glucose management.   Her blood glucoses have improved overnight, however since she was not back to normal neurology was contacted.  She was started on empiric meningitic coverage including acyclovir.  She has not been febrile but does have a leukocytosis.  Though it is higher than it has been previously, every CBC that she has had since December 12/21 has had a leukocytosis, and nearly everyone that has been checked since January 2020.  She is on lamotrigine and topiramate, presumably for her bipolar disorder as I do not see a listed history of seizures.  She is confused and unable to give much in the way of history, but she is able to tell me that she does have a headache.   ROS: Unable to obtain due to altered mental status.   Past Medical History:  Diagnosis Date  . Acute systolic heart failure ([place])   . Anxiety   . Asthma   . Bipolar depression ([place])   . Carpal tunnel syndrome, bilateral 09/07/2019  . Chronic bronchitis ([place])   . Chronic esophagogastric ulcer   . Chronic stomach ulcer   . Diabetic ketoacidosis without coma associated with type 2 diabetes mellitus ([place]) 05/12/2019  . Diarrhea 12/25/2019  . Dysequilibrium 04/03/2020  . Elevated transaminase level 07/21/2018  . Encounter for screening colonoscopy 04/03/2020  . Encounter for screening mammogram for malignant neoplasm of breast 04/03/2020  . Esophagitis determined by endoscopy   . Extravasation accident, initial encounter 07/21/2018  . Fibroid   . GERD (gastroesophageal reflux disease)   . Heart murmur   . Hepatic steatosis    . History of blood transfusion 2010   "related to subclavian stent"  . History of hiatal hernia   . Hyperlipidemia   . Hypertension   . Hypothyroid   . LFTs abnormal   . Migraine    "cerebral migraines; 1-2/month" (06/29/2018)  . Nausea 08/09/2018  . Need for immunization against influenza 04/03/2020  . Non-ST elevation (NSTEMI) myocardial infarction ([place]) 10/27/2018  . 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 SOB (shortness of breath)   . 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 left upper extremity claudication and subclavian steal symptoms. Carotid Dopplers performed 04/17/2019 with suggestion of occluded left subclavian artery with retrograde vertebral filling and subsequent CT angiogram performed 04/27/2019 with confirmation -Admitted for elective PV angiogram in which recommendations were for a left common carotid to subclavian bypass.  . Stroke ([place])    "I've had 2; most recent one was ~ 2015 or before; affected balance" (06/29/2018)  . 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 Thalassemia minor   . Type II diabetes mellitus ([place])   . Vitamin D deficiency      Family History  Problem Relation Age of Onset  . Diabetes Mother   . Hypertension Mother   . Hyperlipidemia Mother   . CAD Mother   . Cervical cancer Mother   . CAD Father   . Heart failure Father   . Testicular cancer  Father   . Hypertension Sister   . Hyperlipidemia Sister   . Hypertension Brother   . Hyperlipidemia Brother      Social History:  reports that she quit smoking about 6 years ago. Her smoking use included cigarettes. She has a 72.00 pack-year smoking history. She has never used smokeless tobacco. She reports previous alcohol use. She reports previous drug use.   Exam: Current vital signs: BP 124/78 (BP Location: Left Arm)   Pulse (!) 102   Temp 98.2 F (36.8 C) (Oral)   Resp 16   Ht 5\' 3"  (1.6 m)    Wt 108.9 kg   LMP 08/03/2013   SpO2 93%   BMI 42.53 kg/m  Vital signs in last 24 hours: Temp:  [98.2 F (36.8 C)-98.8 F (37.1 C)] 98.2 F (36.8 C) (05/16 0000) Pulse Rate:  [100-110] 102 (05/16 0000) Resp:  [16-20] 16 (05/16 0000) BP: (124-144)/(69-96) 124/78 (05/16 0000) SpO2:  [92 %-95 %] 93 % (05/16 0000)   Physical Exam  Constitutional: Appears well-developed and well-nourished.  Psych: Affect appropriate to situation Eyes: No scleral injection HENT: No OP obstruction MSK: no joint deformities.  Cardiovascular: Normal rate and regular rhythm.  Respiratory: Effort normal, non-labored breathing GI: Soft.  No distension. There is no tenderness.  Skin: WDI  Neuro: Mental Status: Patient is obtunded but arousable, she is able to answer me with the year, but does not give me the month.  She is able to follow commands readily.  She will not open her eyes despite repeated requests to do so. Cranial Nerves: II: She fixates and tracks, but refuses to participate with finger counting.  She does blink to threat bilaterally.  Pupils are equal, round, and reactive to light.   III,IV, VI: EOMI without ptosis or diploplia.  V: Facial sensation is symmetric to temperature VII: Facial movement is symmetric.  Motor: She moves all extremities with bilateral arms raising against gravity, she refuses to do so in either lower extremity, but does not have any clear focal findings. sensory: She gives unreliable answers, however does describe decreased sensation in the left leg at least once, though this is not consistent. Deep Tendon Reflexes: Depressed throughout   I have reviewed labs in epic and the results pertinent to this consultation are: Sodium 142, creatinine 0.84, calcium 8.1 with an albumin of 2.9 WBC 20.8, Hematocrit - 50 Venous blood gas pH 7.34(normal), PCO2 33(low), PO2 49(high) Free T4-0.81 (normal) Ammonia - 37  Urinalysis, negative for UTI Blood cultures negative at 1  day Chest x-ray with no acute finding  I have reviewed the images obtained: CT head-unremarkable MRI brain is motion degraded, but I feel that the diffusion images at least are diagnostic and are negative   Impression: 55 year old female with acute encephalopathy in the setting of severe hyperglycemia with DKA.  I think that her mental status is much more likely to be related to her blood sugars and a primary CNS process.  It is not uncommon for mental status improvement to lag significantly behind laboratory improvement.  Given that she has been started on empiric meningitic coverage, as well as her report of a headache, it would be difficult to definitively refute this without lumbar puncture, but my suspicion for this is fairly low.  With her habitus, I think that LP under fluoroscopy would be prudent to pursue.  If she were to return to normal mental status by tomorrow, could consider not pursuing lumbar puncture, but  Recommendations: 1) continue  antibiotics for now 2) consider lumbar puncture for cells, glucose, protein, cultures, HSV 3) routine EEG 4) continue medical management of her underlying medical issues (hyperglycemia) 5) neurology will follow   Roland Rack, MD [facility] [phone]  If 7pm- 7am, please page neurology on call as listed in [place].

## 2020-12-15 NOTE — Progress Notes (Signed)
Family Medicine Teaching Service Daily Progress Note Intern Pager: [phone]  Patient name: Morgan [name] Medical record number: [id number] Date of birth: 1965-11-27 Age: 55 y.o. Gender: female  Primary Care Provider: [name], DO Consultants: Neurology Code Status: Full   Pt Overview and Major Events to Date:  12/13/20: admitted for Hyperglycemia and Altered Mental Status 12/13/20: Transitioned to subcutaneous insulin.   Abx, Antivirals  (5/14) - Aztreonam  (5/14-5/16) - Vancomycin (5/15-5/16) - Meropenem  (5/15-5/16)- Acyclovir   Assessment and Plan: Morgan [name] is a 55 y.o. female who presented w/ altered mental status and DKA, with DKA resolved and still encephalopathic.  Past medical history significant for type 2 diabetes and previous admissions for DKA, PUD and esophagitis, chronic leukocytosis, CVA, NSTEMI, CAD status post stent, hypertension, hypothyroidism, bipolar, anxiety.   Toxic metabolic encephalopathy in the setting of DKA   Likely due to severe DKA, as she is very slowly improving. Was more alert this morning and able to follow commands and have a conversation. Neuro was at bedside as well and recommended discontinuing meningitic coverage given her improvement. EEG without evidence of seizures. Low suspicion for CNS infection given negative workup.  - Discontinuing Vancomycin,  Meropenem, Acyclovir, Aztreonam  - PT/OT- recommends SNF  - SLP recommended dysphagia 1 - Contacted son who verified medications and history (see separate note)   Type II DM, poorly controlled  DKA resolved and patient transitioned to subcutaneous insulin.  Calculated insulin requirement is 50 units Lantus with 16 units 3 times daily with meals and additional sliding scale.  CBG this am 180. Still NPO but as she starts to eat will need to adjust. For now, decreasing Lantus to 40 units at night.   Home meds include as of 09/18/20.             - Lantus 55u AM 09:30 AM; Lantus 55u  09:30 PM - Humalog 35u AM with breakfast - Humalog 35u with a proper lunch; only 10 units with a snack - Humalog 35u for dinner/at night; take only 10 units with a snack  -Decrease Lantus to 40units at night given NPO -CBG + modSSI q4 hours  -Transition to meal time dosing (16 units + mod SSI) when PO  - AM BMP - Diabetes coordinator  -Close o/p f/u, CCM candidate   Hypernatremia: 146< 142 - NS 144 mL/h  - PM BMP @1400   Chronic Leukocytosis  WBC 14.6< 23.9 Polymorphic with outpatient work up in February.  - Likely needs heme/onc follow up outpatient  - continue to monitor   Tachycardia HR this am 108. Unclear cause though likely due to dehydration from DKA vs rebound tachycardia as she was on Metoprolol at home.  - Increased fluids to NS 144 mL/h -Continue cardiac monitoring   Concern for hematemesis  +FOBT , Hx of PUD. Concern for hematemesis & tarry stool per EMS. Dried blood in nose and mouth with positive FOBT.  Last EGD in 2019 without esophageal varices.  Her hemoglobin is stable this morning. Consider mallory weiss tear in setting of emesis with DKA? Med rec: home protonix 40 mg BID  - Continue IV protonix 40 mg BID, switch to po likely tomorrow  - Re-evaluated for VTE ppx - will restart Lovenox   Urinary Retention 1500 UOP last 24h. Net +914. Foley placed on 12/13/20 PM. - discontinue Foley  Amphetamine use  Urine positive for amphetamines but son denies drug use. Son did endorse 4 empty bottles of Trazodone (can cause  false positive in urine) bedside and thinks she was taking more than prescribed since she has been very sleepy recently     HTN CAD s/p stent 139/87 this morning  EKG normal. Troponin yesterday 14>51.  Med rec: ASA 81, atorvastatin 80 mg, Metoprolol 50 mg BID (last ordered 08/19/20).  - Continue cardiac monitoring   Hypothyroidism  TSH wnl.  Med rec: Synthroid 150 mcg daily (ordered on 10/16/20 but never  dispensed)  Per son,  Hold meds until med rec/AMS   Bipolar Disorder  Med rec: lamictal 200 mg daily, topamax 75 mg, trazadone 50 mg, venlafaxine XR 150 mg daily, abilify 5 mg daily. All but venlafaxine marked as ordered on 10/16/20 but never dispensed on med rec. Confirmed patient is taking all of the above with son.  - consider starting home medications as mental status improves   FEN/GI:  Fluids: NS 182mL/h  Electrolytes: replete PRN    Nutrition: NPO until SLP eval    Status is: Inpatient  Remains inpatient appropriate because:Altered mental status, Ongoing diagnostic testing needed not appropriate for outpatient work up, Unsafe d/c plan, IV treatments appropriate due to intensity of illness or inability to take PO and Inpatient level of care appropriate due to severity of illness   Dispo: The patient is from: Home  Anticipated d/c is to: TBD  Patient currently is not medically stable to d/c.              Difficult to place patient No   Disposition: Home vs SNF?   Subjective:   Patent able to tell me she lives alone and her son has been mad at her and left her. She able to say her name and follow comands. She reports having a headache, CP, L arm pain.  Neurologist at bedside and performed evaluation as well    Objective: Temp:  [98.2 F (36.8 C)-98.8 F (37.1 C)] 98.4 F (36.9 C) (05/16 0400) Pulse Rate:  [100-110] 100 (05/16 0400) Resp:  [16-20] 18 (05/16 0400) BP: (124-147)/(69-96) 147/69 (05/16 0400) SpO2:  [92 %-95 %] 94 % (05/16 0400) Weight:  [104 kg] 104 kg (05/16 0428) Physical Exam: General: laying in bed in and out of sleep, responsive, NAD Cardiovascular: RRR no murmurs Respiratory: CTAB normal WOB Abdomen: soft, non distended, non tender Extremities: warm, dry. No LE edmea  Neuro: Alert to name, unsure where she is. Able to name simple objects  CN 2-12 in tact. Can raise arms and legs but quickly drops them. Unable to  assess strength or coordination. Sensation in tact bilaterally   Laboratory: Recent Labs  Lab 12/13/20 1036 12/14/20 0850 12/15/20 0216  WBC 24.7* 23.9* 14.6*  HGB 13.3 12.4 13.4  HCT 45.8 40.7 44.0  PLT 252 193 PLATELET CLUMPS NOTED ON SMEAR, UNABLE TO ESTIMATE   Recent Labs  Lab 12/13/20 0412 12/13/20 0500 12/14/20 0337 12/14/20 1115 12/15/20 0216  NA 135   < > 142 142 146*  K 4.3   < > 4.3 4.8 4.7  CL 96*   < > 114* 117* 117*  CO2 <7*   < > 15* 16* 21*  BUN 77*   < > 27* 22* 15  CREATININE 2.48*   < > 1.09* 0.84 0.71  CALCIUM 8.2*   < > 8.0* 8.1* 8.3*  PROT 6.7  --  5.6*  --   --   BILITOT 3.7*  --  1.0  --   --   ALKPHOS 92  --  76  --   --  ALT 15  --  15  --   --   AST 12*  --  14*  --   --   GLUCOSE 924*   < > 407* 321* 155*   < > = values in this interval not displayed.     Imaging/Diagnostic Tests: CT ANGIO CHEST PE W OR WO CONTRAST  Result Date: 12/14/2020 CLINICAL DATA:  Rule out pulmonary embolus.  High probability. EXAM: CT ANGIOGRAPHY CHEST WITH CONTRAST TECHNIQUE: Multidetector CT imaging of the chest was performed using the standard protocol during bolus administration of intravenous contrast. Multiplanar CT image reconstructions and MIPs were obtained to evaluate the vascular anatomy. CONTRAST:  28mL OMNIPAQUE IOHEXOL 350 MG/ML SOLN COMPARISON:  11/24/2019 FINDINGS: Cardiovascular: The heart size appears within normal limits. No pericardial effusion. Aortic atherosclerosis and coronary artery calcifications. The main pulmonary artery appears patent. No lobar or segmental pulmonary artery filling defects identified Mediastinum/Nodes: No discrete thyroid nodule. The trachea appears patent and is midline. Mild circumferential wall thickening involving the mid and distal esophagus is identified, nonspecific. There are no enlarged axillary, supraclavicular, mediastinal, or hilar lymph nodes. Lungs/Pleura: Left lung base airspace consolidation. Mild subpleural  consolidation is also identified within the posterior right base. Perifissural nodule in the right midlung is identified along the major fissure measuring 5 mm, image 46/10. Upper Abdomen: No acute abnormality. Musculoskeletal: Upper thoracic spine vertebral hemangioma is identified. No acute or suspicious osseous findings identified. Review of the MIP images confirms the above findings. IMPRESSION: 1. No evidence for acute pulmonary embolus. 2. There is progressive airspace consolidation involving the left lower lobe when compared with CT of the abdomen pelvis from 12/03/2020. Correlate for any clinical signs or symptoms of pneumonia. 3. Aortic atherosclerosis and coronary artery calcifications. Aortic Atherosclerosis (ICD10-I70.0). Electronically Signed   By: Kerby Moors M.D.   On: 12/14/2020 13:01   MR BRAIN WO CONTRAST  Result Date: 12/14/2020 CLINICAL DATA:  Mental status change, unknown cause. EXAM: MRI HEAD WITHOUT CONTRAST TECHNIQUE: Multiplanar, multiecho pulse sequences of the brain and surrounding structures were obtained without intravenous contrast. COMPARISON:  Prior head CT examinations 12/13/2020 and earlier. Brain MRI 01/06/2018 FINDINGS: Brain: Due to altered mental status, the patient was unable to tolerate the full examination. Only axial and coronal diffusion-weighted sequences, a sagittal T1 weighted sequence and an axial T2 weighted sequence could be obtained. The sagittal T1 weighted sequence is mildly motion degraded. The axial T2 weighted sequence is moderate to severely motion degraded. Cerebral volume is normal for age. Chronic lacunar infarcts within the bilateral centrum semiovale and corona radiata. Additionally, there is a redemonstrated small remote infarct within the medial left cerebellar hemisphere. There is no acute infarct. Within described limitations, no intracranial mass or extra-axial fluid collection is identified. No midline shift Vascular: Expected proximal  arterial flow voids. Skull and upper cervical spine: No focal marrow lesion. Sinuses/Orbits: Visualized orbits show no acute finding. Trace bilateral ethmoid and right maxillary sinus mucosal thickening. Moderate-sized right maxillary sinus mucous retention cyst. Other: Trace fluid within the right mastoid air cells. IMPRESSION: Due to altered mental status, the patient was unable to tolerate the full examination. Prematurely terminated and motion degraded examination, as described. No evidence of acute infarction. Chronic lacunar infarcts within the bilateral centrum semiovale and corona radiata. Redemonstrated small remote infarct within the medial left cerebellar hemisphere. Paranasal sinus disease, as described. Trace right mastoid effusion. Electronically Signed   By: Kellie Simmering DO   On: 12/14/2020 13:55    Maresha Anastos,  Weldon Picking, DO 12/15/2020, 7:21 AM PGY-1, [facility] Intern pager: [phone], text pages welcome

## 2020-12-15 NOTE — Progress Notes (Addendum)
Inpatient Diabetes Program Recommendations  AACE/ADA: New Consensus Statement on Inpatient Glycemic Control (2015)  Target Ranges:  Prepandial:   less than 140 mg/dL      Peak postprandial:   less than 180 mg/dL (1-2 hours)      Critically ill patients:  140 - 180 mg/dL   Results for Morgan [name], Morgan [name] (MRN [id number]) as of 12/15/2020 07:18  Ref. Range 12/13/2020 04:12  Glucose Latest Ref Range: 70 - 99 mg/dL [facility])   Results for Morgan [name], Morgan [name] (MRN [id number]) as of 12/15/2020 07:18  Ref. Range 12/14/2020 06:16 12/14/2020 10:26 12/14/2020 13:05 12/14/2020 16:00 12/14/2020 18:17 12/14/2020 20:10  Glucose-Capillary Latest Ref Range: 70 - 99 mg/dL 312 (H)  11 units NOVOLOG  324 (H)  11 units NOVOLOG @9 :16am 274 (H)  8 units NOVOLOG  265 (H)  8 units NOVOLOG  233 (H)    195 (H)  3 units NOVOLOG  50 units LANTUS @10 :43pm   Results for Morgan [name], Morgan [name] (MRN [id number]) as of 12/15/2020 07:18  Ref. Range 12/14/2020 23:52 12/15/2020 04:22  Glucose-Capillary Latest Ref Range: 70 - 99 mg/dL 185 (H)  3 units NOVOLOG   180 (H)  3 units NOVOLOG    Results for Morgan [name], Morgan [name] (MRN [id number]) as of 12/15/2020 07:18  Ref. Range 07/07/2020 15:20 12/13/2020 15:43  Hemoglobin A1C Latest Ref Range: 4.8 - 5.6 % 11.9 (A) 13.3 (H)  (335 mg/dl)    Admit with: DKA/ AMS  History: DM  Home DM Meds: Jardiance 25 mg Daily       Basaglar 55 units BID       Humalog 4-10 units 4 times per day   Current Orders: Novolog Moderate Correction Scale/ SSI (0-15 units) Q4 hours      Lantus 50 units QHS   Being treated with antibiotics at present to cover for meningitis per MD notes    MD- Note patient admitted with DKA and is taking Jardiance at home.  Pts taking this oral medication often take longer for their DKA to resolve.  May consider discontinuation of this med for pt at time of discharge home since pt admitted with DKA    Addendum 12pm--Attempted to speak with pt  this afternoon.  Called pt's name several times and shook her arm but pt would not rouse to my voice.  Snoring quite loudly when I was in the room.  Opened her eyes just a tiny bit to my voice but then closed them quickly and would not open them again.  RN caring for pt told me this has been her behavior for the last day.  Attempted to call pt's son on number listed without success.  Did not leave a voicemail.      --Will follow patient during hospitalization--  Wyn Quaker RN, MSN, CDE Diabetes Coordinator Inpatient Glycemic Control Team Team Pager: [phone] (8a-5p)

## 2020-12-15 NOTE — Evaluation (Signed)
Occupational Therapy Evaluation Patient Details Name: Morgan [name] MRN: [id number] DOB: 01/26/1966 Today's Date: 12/15/2020    History of Present Illness 55 y.o. female presented on 5/14 with encephalopathy.  She was found to be in DKA in the ED with blood glucose in the 900s and a pH of 7.1.  PMH diabetes, hypertension, hyperlipidemia, hypothyroidism, ?bipolar, CVA with residual balance deficits   Clinical Impression   Pt admitted with above diagnosis. She presents in bed, asleep with SpO2 reading 78% on RA. Pt woke to therapist voice and SpO2 rebounded to >94% on RA. Pt lethargic throughout session and falling asleep intermittently, but would easily wake to therapist calling pt's name. Pt demonstrate BUE/BLE weakness and cognitive limitations impacting independence with self-care. Pt required minA to use swab for a sip of water. Pt requires modA for rolling in bed. Further mobility deferred at this time secondary to pt's level of arousal. Due to decline in current level of function, pt would benefit from acute OT to address established goals to facilitate safe D/C to venue listed below. At this time, recommend SNF follow-up. Will continue to follow acutely.    Follow Up Recommendations  SNF;Supervision/Assistance - 24 hour    Equipment Recommendations  Other (comment) (defer to next venue)    Recommendations for Other Services       Precautions / Restrictions Precautions Precautions: Fall Restrictions Weight Bearing Restrictions: No      Mobility Bed Mobility Overal bed mobility: Needs Assistance Bed Mobility: Rolling Rolling: Mod assist         General bed mobility comments: modA to roll in bed, further mobility limited secondary to pt's level of arousal    Transfers                 General transfer comment: deferred at this time    Balance                                           ADL either performed or assessed with clinical judgement    ADL Overall ADL's : Needs assistance/impaired Eating/Feeding: Minimal assistance Eating/Feeding Details (indicate cue type and reason): minimal assistance to use swab to wet mouth with water, pt not staying awake long enough to drink water Grooming: Minimal assistance   Upper Body Bathing: Maximal assistance   Lower Body Bathing: Total assistance   Upper Body Dressing : Maximal assistance   Lower Body Dressing: Total assistance     Toilet Transfer Details (indicate cue type and reason): deferred   Toileting - Clothing Manipulation Details (indicate cue type and reason): deferred       General ADL Comments: pt limited by cognition, BUE/BLE weakness, and level of arousal, kept evaluation bed level     Vision Baseline Vision/History: Wears glasses Additional Comments: will continue to assess     Perception     Praxis      Pertinent Vitals/Pain Pain Assessment: No/denies pain     Hand Dominance Right   Extremity/Trunk Assessment Upper Extremity Assessment Upper Extremity Assessment: Generalized weakness;Difficult to assess due to impaired cognition;RUE deficits/detail;LUE deficits/detail RUE Deficits / Details: grip strength 2/5;limited shoulder flexion, pt attempted AROM and progressed about 10*;decreased gross motor coordination when reaching for swab, poor fine motor control, pt demonstrated loose grasp on swab RUE Coordination: decreased fine motor;decreased gross motor LUE Deficits / Details: grip strength 2/5;limited shoulder flexion, pt attempted  AROM and progressed about 10*;decreased gross motor coordination when reaching for cup;spilling water from cup LUE Coordination: decreased fine motor;decreased gross motor   Lower Extremity Assessment Lower Extremity Assessment: Generalized weakness;Defer to PT evaluation;Difficult to assess due to impaired cognition (pt demonstrated ankle pumps and some knee flexion/extension)       Communication  Communication Communication: No difficulties   Cognition Arousal/Alertness: Lethargic Behavior During Therapy: Flat affect Overall Cognitive Status: Impaired/Different from baseline Area of Impairment: Orientation;Attention;Following commands;Safety/judgement;Problem solving                 Orientation Level: Disoriented to;Time;Situation Current Attention Level: Focused Memory: Decreased recall of precautions;Decreased short-term memory Following Commands: Follows one step commands inconsistently;Follows one step commands with increased time Safety/Judgement: Decreased awareness of safety;Decreased awareness of deficits Awareness: Intellectual Problem Solving: Slow processing;Decreased initiation;Requires verbal cues;Requires tactile cues General Comments: pt requires multimodal cues for sequencing. pt falling asleep throughout session, waking up easily to therapist stating her name. Pt keeping eyes open about 1 min with prompting to open and keep open. Pt unsure why she was admitted.   General Comments  Pt asleep upon arrival, SpO2 78% on RA, pt woke to therapist calling her name, SpO2 95% on RA while pt awake and sitting upright at 30*    Exercises     Shoulder Instructions      Home Living Family/patient expects to be discharged to:: Private residence Living Arrangements:  (pt reports her brother) Available Help at Discharge: Family;Available PRN/intermittently Type of Home: Apartment Home Access: Stairs to enter Entrance Stairs-Number of Steps: pt unable to specify         Bathroom Shower/Tub: Teacher, early years/pre: Standard         Additional Comments: pt reports she lives with her brother in an apartment, unable to provide number of stairs to enter, reports no elevator;pt reports no equipment at home      Prior Functioning/Environment Level of Independence: Independent        Comments: pt reports she was independent with ADL/IADL and  functional mobility without use of AD, unsure accuracy of information provided secondary to cognitive limitations        OT Problem List: Decreased activity tolerance;Impaired balance (sitting and/or standing);Decreased cognition;Decreased safety awareness;Decreased knowledge of use of DME or AE;Cardiopulmonary status limiting activity;Impaired UE functional use      OT Treatment/Interventions: Self-care/ADL training;Therapeutic exercise;Energy conservation;DME and/or AE instruction;Therapeutic activities;Cognitive remediation/compensation;Patient/family education;Balance training    OT Goals(Current goals can be found in the care plan section) Acute Rehab OT Goals Patient Stated Goal: none stated OT Goal Formulation: With patient Time For Goal Achievement: 12/29/20 Potential to Achieve Goals: Good ADL Goals Pt Will Transfer to Toilet: with min assist;ambulating Additional ADL Goal #1: Pt will demonstrate anticipatory awareness for safe engagement in ADL/IADL and functional mobility. Additional ADL Goal #2: Pt will consistently follow multi-step commands during ADL/IADL and functional mobility.  OT Frequency: Min 2X/week   Barriers to D/C:            Co-evaluation              AM-PAC OT "6 Clicks" Daily Activity     Outcome Measure Help from another person eating meals?: A Little Help from another person taking care of personal grooming?: A Little Help from another person toileting, which includes using toliet, bedpan, or urinal?: Total Help from another person bathing (including washing, rinsing, drying)?: A Lot Help from another person to put on and taking  off regular upper body clothing?: A Lot Help from another person to put on and taking off regular lower body clothing?: Total 6 Click Score: 12   End of Session Nurse Communication: Mobility status  Activity Tolerance: Patient limited by lethargy Patient left: in bed;with call bell/phone within reach;with bed alarm  set  OT Visit Diagnosis: Unsteadiness on feet (R26.81);Other abnormalities of gait and mobility (R26.89);Muscle weakness (generalized) (M62.81);Other symptoms and signs involving cognitive function                Time: 1341-1355 OT Time Calculation (min): 14 min Charges:  OT General Charges $OT Visit: 1 Visit OT Evaluation $OT Eval Moderate Complexity: [place] OTR/L Acute Rehabilitation Services Office: [place] 12/15/2020, 2:35 PM

## 2020-12-16 LAB — BASIC METABOLIC PANEL
Anion gap: 5 (ref 5–15)
Anion gap: 11 (ref 5–15)
BUN: 8 mg/dL (ref 6–20)
BUN: 5 mg/dL — ABNORMAL LOW (ref 6–20)
CO2: 25 mmol/L (ref 22–32)
CO2: 22 mmol/L (ref 22–32)
Calcium: 8.5 mg/dL — ABNORMAL LOW (ref 8.9–10.3)
Calcium: 8.4 mg/dL — ABNORMAL LOW (ref 8.9–10.3)
Chloride: 111 mmol/L (ref 98–111)
Chloride: 106 mmol/L (ref 98–111)
Creatinine, Ser: 0.55 mg/dL (ref 0.44–1.00)
Creatinine, Ser: 0.53 mg/dL (ref 0.44–1.00)
GFR, Estimated: >60 mL/min (ref >60)
GFR, Estimated: >60 mL/min (ref >60)
Glucose, Bld: 125 mg/dL — ABNORMAL HIGH (ref 70–99)
Glucose, Bld: 282 mg/dL — ABNORMAL HIGH (ref 70–99)
Potassium: 2.9 mmol/L — ABNORMAL LOW (ref 3.5–5.1)
Potassium: 3.4 mmol/L — ABNORMAL LOW (ref 3.5–5.1)
Sodium: 141 mmol/L (ref 135–145)
Sodium: 139 mmol/L (ref 135–145)

## 2020-12-16 LAB — GLUCOSE, CAPILLARY
Glucose-Capillary: 181 mg/dL — ABNORMAL HIGH (ref 70–99)
Glucose-Capillary: 129 mg/dL — ABNORMAL HIGH (ref 70–99)
Glucose-Capillary: 167 mg/dL — ABNORMAL HIGH (ref 70–99)
Glucose-Capillary: 225 mg/dL — ABNORMAL HIGH (ref 70–99)
Glucose-Capillary: 335 mg/dL — ABNORMAL HIGH (ref 70–99)
Glucose-Capillary: 328 mg/dL — ABNORMAL HIGH (ref 70–99)
Glucose-Capillary: 318 mg/dL — ABNORMAL HIGH (ref 70–99)

## 2020-12-16 LAB — CBC
HCT: 44.5 % (ref 36.0–46.0)
Hemoglobin: 13.3 g/dL (ref 12.0–15.0)
MCH: 19.9 pg — ABNORMAL LOW (ref 26.0–34.0)
MCHC: 29.9 g/dL — ABNORMAL LOW (ref 30.0–36.0)
MCV: 66.6 fL — ABNORMAL LOW (ref 80.0–100.0)
Platelets: 166 10*3/uL (ref 150–400)
RBC: 6.68 MIL/uL — ABNORMAL HIGH (ref 3.87–5.11)
RDW: 18.6 % — ABNORMAL HIGH (ref 11.5–15.5)
WBC: 10.5 10*3/uL (ref 4.0–10.5)
nRBC: 0 % (ref 0.0–0.2)

## 2020-12-16 LAB — PHOSPHORUS: Phosphorus: 1 mg/dL — CL (ref 2.5–4.6)

## 2020-12-16 LAB — MAGNESIUM
Magnesium: 1.8 mg/dL (ref 1.7–2.4)
Magnesium: 1.5 mg/dL — ABNORMAL LOW (ref 1.7–2.4)

## 2020-12-16 MED ORDER — POTASSIUM PHOSPHATES 15 MMOLE/5ML IV SOLN
30.0000 mmol | Freq: Once | INTRAVENOUS | Status: AC
  Administered 2020-12-16: 30 mmol via INTRAVENOUS
  Filled 2020-12-16: qty 10

## 2020-12-16 MED ORDER — PANTOPRAZOLE SODIUM 40 MG PO TBEC
40.0000 mg | DELAYED_RELEASE_TABLET | Freq: Two times a day (BID) | ORAL | Status: DC
  Administered 2020-12-16 – 2020-12-19 (×6): 40 mg via ORAL
  Filled 2020-12-16: qty 1
  Filled 2020-12-16: qty 2
  Filled 2020-12-16 (×4): qty 1

## 2020-12-16 MED ORDER — MAGIC MOUTHWASH
2.0000 mL | Freq: Three times a day (TID) | ORAL | Status: DC | PRN
  Administered 2020-12-16 – 2020-12-17 (×4): 2 mL via ORAL
  Filled 2020-12-16 (×5): qty 5

## 2020-12-16 MED ORDER — POTASSIUM CHLORIDE CRYS ER 20 MEQ PO TBCR
40.0000 meq | EXTENDED_RELEASE_TABLET | Freq: Once | ORAL | Status: AC
  Administered 2020-12-16: 40 meq via ORAL
  Filled 2020-12-16: qty 2

## 2020-12-16 MED ORDER — LEVOTHYROXINE SODIUM 75 MCG PO TABS
150.0000 ug | ORAL_TABLET | Freq: Every day | ORAL | Status: DC
  Administered 2020-12-16 – 2020-12-18 (×3): 150 ug via ORAL
  Filled 2020-12-16 (×3): qty 2

## 2020-12-16 MED ORDER — ALUM & MAG HYDROXIDE-SIMETH 200-200-20 MG/5ML PO SUSP
30.0000 mL | Freq: Four times a day (QID) | ORAL | Status: DC | PRN
  Administered 2020-12-16 – 2020-12-19 (×5): 30 mL via ORAL
  Filled 2020-12-16 (×5): qty 30

## 2020-12-16 MED ORDER — POTASSIUM CHLORIDE CRYS ER 20 MEQ PO TBCR
40.0000 meq | EXTENDED_RELEASE_TABLET | Freq: Three times a day (TID) | ORAL | Status: DC
  Administered 2020-12-16: 40 meq via ORAL
  Filled 2020-12-16: qty 2

## 2020-12-16 MED ORDER — MAGNESIUM SULFATE 2 GM/50ML IV SOLN
2.0000 g | Freq: Once | INTRAVENOUS | Status: AC
  Administered 2020-12-16: 2 g via INTRAVENOUS
  Filled 2020-12-16: qty 50

## 2020-12-16 MED ORDER — ATORVASTATIN CALCIUM 80 MG PO TABS
80.0000 mg | ORAL_TABLET | Freq: Every day | ORAL | Status: DC
  Administered 2020-12-16 – 2020-12-19 (×4): 80 mg via ORAL
  Filled 2020-12-16 (×4): qty 1

## 2020-12-16 NOTE — Progress Notes (Signed)
S: went to check in on patient. Nurse reported that she was doing well prior to going to sleep.   O: patient sleeping peacefully in supine position. No acute respiratory distress. Breathing unlabored.  Blood pressure (!) 145/82, pulse 97, temperature 98.1 F (36.7 C), temperature source Oral, resp. rate 16, height 5\' 3"  (1.6 m), weight 104 kg, last menstrual period 08/03/2013, SpO2 97 %.  A/P: Midnight BMP was not drawn yet. Most recent K+ 2.8 and received 27mEq PO K+ 1815.  - requested lab to redraw lab - ordered 7mEq more [place] - vitals per floor protocol

## 2020-12-16 NOTE — Progress Notes (Signed)
Patient called out to stand and sit in chair at 0335. Nurse and nurse tech assisted patient to chair successfully with walker. Patient sat in chair for 30 minutes before calling nurse again to go back to bed. Nurse asked patient to wait for a couple of minutes while she tends to another patient and she would be right there, patient got upset and pressed her call light 5 minutes later. When the nurse tech went in to get patients vitals, patient stated she was fine and did not need anything. RN overheard the conversation between patient and nurse tech across the hallway. Patient called back out to be assisted back to bed 10 minutes later and this time refused for the nurse care partner to enter back in room stating that she "fears for her life and how she has been accused by the nursing staff for being a liar." I asked patient was she ready to go back to bed now and she stated "no I did not want to go back to bed" RN discussed with patient that she had in fact been requesting to go back to bed, so we will go to bed now and she started saying that everyone is accusing her of being "a liar and she fears for her safety."
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
## 2020-12-16 NOTE — Plan of Care (Signed)
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
## 2020-12-16 NOTE — Progress Notes (Signed)
Date and time results received: 12/16/20 216pm   Test: Phosphorus  Critical Value: 1.0  Name of Provider Notified: Dagr MD   Orders Received? Or Actions Taken?: No new orders

## 2020-12-16 NOTE — Progress Notes (Signed)
  Speech Language Pathology Treatment: Dysphagia  Patient Details Name: Morgan [name] MRN: [id number] DOB: 06-23-1966 Today's Date: 12/16/2020 Time: 1980-2217 SLP Time Calculation (min) (ACUTE ONLY): 17 min  Assessment / Plan / Recommendation Clinical Impression  Followed up for diet tolerance and advancement. Pt with improved menation,upright in chair at bedside and eager for PO. Assessed with regular texture. Pt with adequate mastication and oral clearance. No overt s/sx of aspiration with any PO. Recommend diet advancement to regular and thin liquids. Menu brought to patients room, assisted with placing breakfast order. No further ST needs identified.    HPI HPI: Morgan [name] is a 55 y.o. female who presented w/ altered mental status and DKA, with DKA resolved and still encephalopathic.  Past medical history significant for type 2 diabetes and previous admissions for DKA, PUD and esophagitis, chronic leukocytosis, CVA, NSTEMI, CAD status post stent, hypertension, hypothyroidism, bipolar, anxiety      SLP Plan  All goals met       Recommendations  Diet recommendations: Regular;Thin liquid Liquids provided via: Straw;Cup Medication Administration: Whole meds with liquid Supervision: Patient able to self feed Compensations: Slow rate;Small sips/bites Postural Changes and/or Swallow Maneuvers: Seated upright 90 degrees;Upright 30-60 min after meal                Oral Care Recommendations: Oral care BID Follow up Recommendations: None SLP Visit Diagnosis: Dysphagia, unspecified (R13.10) Plan: All goals met       GO               Hayden Rasmussen MA, CCC-SLP Acute Rehabilitation Services   12/16/2020, 9:15 AM
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
## 2020-12-17 LAB — TYPE AND SCREEN
ABO/RH(D): O POS
Antibody Screen: POSITIVE
Donor AG Type: NEGATIVE
Donor AG Type: NEGATIVE
Unit division: 0
Unit division: 0

## 2020-12-17 LAB — BASIC METABOLIC PANEL
Anion gap: 8 (ref 5–15)
BUN: 6 mg/dL (ref 6–20)
CO2: 26 mmol/L (ref 22–32)
Calcium: 8.4 mg/dL — ABNORMAL LOW (ref 8.9–10.3)
Chloride: 107 mmol/L (ref 98–111)
Creatinine, Ser: 0.55 mg/dL (ref 0.44–1.00)
GFR, Estimated: >60 mL/min (ref >60)
Glucose, Bld: 149 mg/dL — ABNORMAL HIGH (ref 70–99)
Potassium: 3.4 mmol/L — ABNORMAL LOW (ref 3.5–5.1)
Sodium: 141 mmol/L (ref 135–145)

## 2020-12-17 LAB — BPAM RBC
Blood Product Expiration Date: 202206122359
Blood Product Expiration Date: 202206122359
Unit Type and Rh: 5100
Unit Type and Rh: 5100

## 2020-12-17 LAB — LAMOTRIGINE LEVEL: Lamotrigine Lvl: <1 ug/mL — ABNORMAL LOW (ref 2.0–20.0)

## 2020-12-17 LAB — C DIFFICILE (CDIFF) QUICK SCRN (NO PCR REFLEX)
C Diff antigen: NEGATIVE
C Diff interpretation: NOT DETECTED
C Diff toxin: NEGATIVE

## 2020-12-17 LAB — GLUCOSE, CAPILLARY
Glucose-Capillary: 110 mg/dL — ABNORMAL HIGH (ref 70–99)
Glucose-Capillary: 119 mg/dL — ABNORMAL HIGH (ref 70–99)
Glucose-Capillary: 269 mg/dL — ABNORMAL HIGH (ref 70–99)
Glucose-Capillary: 209 mg/dL — ABNORMAL HIGH (ref 70–99)
Glucose-Capillary: 263 mg/dL — ABNORMAL HIGH (ref 70–99)

## 2020-12-17 LAB — PHOSPHORUS: Phosphorus: 3.1 mg/dL (ref 2.5–4.6)

## 2020-12-17 LAB — MAGNESIUM: Magnesium: 1.9 mg/dL (ref 1.7–2.4)

## 2020-12-17 MED ORDER — LOPERAMIDE HCL 2 MG PO CAPS
2.0000 mg | ORAL_CAPSULE | Freq: Once | ORAL | Status: AC
  Administered 2020-12-17: 2 mg via ORAL
  Filled 2020-12-17: qty 1

## 2020-12-17 MED ORDER — INSULIN ASPART 100 UNIT/ML IJ SOLN
0.0000 [IU] | Freq: Three times a day (TID) | INTRAMUSCULAR | Status: DC
  Administered 2020-12-17: 8 [IU] via SUBCUTANEOUS
  Administered 2020-12-17 – 2020-12-18 (×2): 5 [IU] via SUBCUTANEOUS
  Administered 2020-12-18: 2 [IU] via SUBCUTANEOUS
  Administered 2020-12-18: 5 [IU] via SUBCUTANEOUS
  Administered 2020-12-19: 8 [IU] via SUBCUTANEOUS
  Administered 2020-12-19: 5 [IU] via SUBCUTANEOUS

## 2020-12-17 MED ORDER — LOSARTAN POTASSIUM 50 MG PO TABS
100.0000 mg | ORAL_TABLET | Freq: Every day | ORAL | Status: DC
  Administered 2020-12-17 – 2020-12-19 (×3): 100 mg via ORAL
  Filled 2020-12-17 (×3): qty 2

## 2020-12-17 MED ORDER — VENLAFAXINE HCL ER 37.5 MG PO CP24: 37.5000 mg | ORAL_CAPSULE | Freq: Every day | ORAL | Status: DC

## 2020-12-17 MED ORDER — INSULIN ASPART 100 UNIT/ML IJ SOLN
5.0000 [IU] | Freq: Three times a day (TID) | INTRAMUSCULAR | Status: DC
  Administered 2020-12-17 – 2020-12-19 (×6): 5 [IU] via SUBCUTANEOUS

## 2020-12-17 MED ORDER — METOPROLOL TARTRATE 50 MG PO TABS
50.0000 mg | ORAL_TABLET | Freq: Two times a day (BID) | ORAL | Status: DC
  Administered 2020-12-17 – 2020-12-19 (×5): 50 mg via ORAL
  Filled 2020-12-17 (×5): qty 1

## 2020-12-17 MED ORDER — POTASSIUM CHLORIDE CRYS ER 20 MEQ PO TBCR
40.0000 meq | EXTENDED_RELEASE_TABLET | Freq: Once | ORAL | Status: AC
  Administered 2020-12-17: 40 meq via ORAL
  Filled 2020-12-17: qty 2

## 2020-12-17 MED ORDER — VENLAFAXINE HCL ER 37.5 MG PO CP24
187.5000 mg | ORAL_CAPSULE | Freq: Every day | ORAL | Status: DC
  Administered 2020-12-18 – 2020-12-19 (×2): 187.5 mg via ORAL
  Filled 2020-12-17 (×2): qty 1

## 2020-12-17 MED ORDER — VENLAFAXINE HCL ER 37.5 MG PO CP24
37.5000 mg | ORAL_CAPSULE | Freq: Every day | ORAL | Status: AC
  Administered 2020-12-17: 37.5 mg via ORAL
  Filled 2020-12-17: qty 1

## 2020-12-17 MED ORDER — ARIPIPRAZOLE 5 MG PO TABS
5.0000 mg | ORAL_TABLET | Freq: Every day | ORAL | Status: DC
  Administered 2020-12-17 – 2020-12-19 (×3): 5 mg via ORAL
  Filled 2020-12-17 (×3): qty 1

## 2020-12-17 MED ORDER — VENLAFAXINE HCL ER 75 MG PO CP24
150.0000 mg | ORAL_CAPSULE | Freq: Every day | ORAL | Status: DC
  Administered 2020-12-17: 150 mg via ORAL
  Filled 2020-12-17: qty 2

## 2020-12-17 MED ORDER — LAMOTRIGINE 100 MG PO TABS
100.0000 mg | ORAL_TABLET | Freq: Every day | ORAL | Status: DC
  Administered 2020-12-17: 100 mg via ORAL
  Filled 2020-12-17: qty 1

## 2020-12-17 NOTE — Progress Notes (Signed)
Physical Therapy Treatment Patient Details Name: Morgan [name] MRN: [id number] DOB: 1965-09-11 Today's Date: 12/17/2020    History of Present Illness 55 y.o. female presented on 5/14 with encephalopathy.  She was found to be in DKA in the ED with blood glucose in the 900s and a pH of 7.1.  PMH diabetes, hypertension, hyperlipidemia, hypothyroidism, ?bipolar, CVA with residual balance deficits    PT Comments    Patient refused PT prior to getting a shower and then was fatigued during PT session (after her shower). Ambulated slightly farther distance, however on return to room refused to attempt step ups or other exercises to further strengthen her legs.     Follow Up Recommendations  Home health PT     Equipment Recommendations  None recommended by PT    Recommendations for Other Services       Precautions / Restrictions Precautions Precautions: Fall    Mobility  Bed Mobility               General bed mobility comments: up in chair    Transfers Overall transfer level: Needs assistance Equipment used: Rolling walker (2 wheeled) Transfers: Sit to/from Stand Sit to Stand: Supervision         General transfer comment: no cues with sit to stand; vc for hand placement with stand to sit  Ambulation/Gait Ambulation/Gait assistance: Min guard Gait Distance (Feet): 95 Feet Assistive device: Rolling walker (2 wheeled) Gait Pattern/deviations: Step-through pattern;Decreased stride length     General Gait Details: steady with use of RW; feels she is too unsteady to attempt without device   Stairs Stairs:  (pt refused pre-ambulation and again post-ambulation)           Wheelchair Mobility    Modified Rankin (Stroke Patients Only)       Balance Overall balance assessment: Needs assistance Sitting-balance support: No upper extremity supported;Feet supported Sitting balance-Leahy Scale: Fair     Standing balance support: No upper extremity  supported Standing balance-Leahy Scale: Fair                              Cognition Arousal/Alertness: Awake/alert Behavior During Therapy: Flat affect Overall Cognitive Status: Impaired/Different from baseline Area of Impairment: Attention;Problem solving                   Current Attention Level: Selective         Problem Solving: Slow processing;Requires verbal cues        Exercises      General Comments General comments (skin integrity, edema, etc.): On initial atttempt to see, pt insisting on taking a shower prior to PT. On return, reported she was fatigued from shower but would participate. Wanted to try walking prior to attempting single step up. On return from ambulating 95 ft she reported she was too fatigued to do step ups. Allowed seated rest and encouraged to do sit to stands for strengthening and pt refused due to fatigue.      Pertinent Vitals/Pain Pain Assessment: No/denies pain    Home Living                      Prior Function            PT Goals (current goals can now be found in the care plan section) Acute Rehab PT Goals Patient Stated Goal: be  able to climb steps without a rail with cane and  confidence Time For Goal Achievement: 12/30/20 Potential to Achieve Goals: Good Progress towards PT goals: Progressing toward goals    Frequency    Min 3X/week      PT Plan Current plan remains appropriate    Co-evaluation              AM-PAC PT "6 Clicks" Mobility   Outcome Measure  Help needed turning from your back to your side while in a flat bed without using bedrails?: A Little Help needed moving from lying on your back to sitting on the side of a flat bed without using bedrails?: A Little Help needed moving to and from a bed to a chair (including a wheelchair)?: A Little Help needed standing up from a chair using your arms (e.g., wheelchair or bedside chair)?: A Little Help needed to walk in hospital  room?: A Little Help needed climbing 3-5 steps with a railing? : A Little 6 Click Score: 18    End of Session   Activity Tolerance: Patient limited by fatigue (O2 sats 100% on room air) Patient left: in chair;with call bell/phone within reach;with chair alarm set Nurse Communication: Mobility status PT Visit Diagnosis: Unsteadiness on feet (R26.81);Other abnormalities of gait and mobility (R26.89);Muscle weakness (generalized) (M62.81)     Time: 1610-9604 PT Time Calculation (min) (ACUTE ONLY): 16 min  Charges:  $Therapeutic Activity: 8-22 mins                      Morgan [name], PT Pager [phone]    [name] 12/17/2020, 11:30 AM

## 2020-12-17 NOTE — Progress Notes (Signed)
Pt. Noted to have large liquid stool. During rounds MD made aware.

## 2020-12-17 NOTE — Plan of Care (Signed)
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
## 2020-12-17 NOTE — Progress Notes (Signed)
Family Medicine Teaching Service Daily Progress Note Intern Pager: [phone]  Patient name: Morgan [name] Medical record number: [id number] Date of birth: 1965/08/28 Age: 55 y.o. Gender: female  Primary Care Provider: [name], DO Consultants: Neurology Code Status: None  Pt Overview and Major Events to Date:  12/13/20: admitted for Hyperglycemia and Altered Mental Status 12/13/20: Transitioned to subcutaneous insulin. Foley placed 12/16/20: Foley removed   Assessment and Plan: Morgan [name] is a 55 y.o. female who presented w/ altered mental status and DKA, with DKA resolved and still encephalopathic.  Past medical history significant for type 2 diabetes and previous admissions for DKA, PUD and esophagitis, chronic leukocytosis, CVA, NSTEMI, CAD status post stent, hypertension, hypothyroidism, bipolar, anxiety.   Type II DM, poorly controlled  Required 40 units Lantus with 16 units 3 times daily with meals and additional sliding scale.  CBG this am 110 followed by 119. Required 45 units Novolog. Start mealtime coverage- 5 units TID  -CBG + modSSI  - Meal coverage 5 units Novolog TID  - Carb modified diet can switch to regular diet  -Close o/p f/u, CCM candidate   Hypokalemia K+ 3.4 this am. Replenished with 40 meq   - continue to monitor    HTN CAD s/p stent, BP 130/110 this morning  Med rec: ASA 81, Losartan 100mg , atorvastatin 80 mg, Metoprolol 50 mg BID (last ordered 08/19/20).  - Restarted losartan, atorvastatin, metoprolol  +FOBT , Hx of PUD Last EGD in 2019 without esophageal varices. Hgb has been stable. Med rec: home protonix 40 mg BID  -Continue protonix 40 mg BID   Urinary Retention, resolved  1750 UOP last 24h with 2 unmeasured. Net +379. Foley placed on 5/14 PM and out 5/17   Bipolar Disorder  Med rec: lamictal 200 mg daily, topamax 75 mg, trazadone 50 mg, venlafaxine XR 150 mg daily, abilify 5 mg daily.  - Psych consulted and signed off   -Restarted Lamictal, venlafaxine, Abilify  - ensure outpatient follow up with psychiatrist    Hypothyroidism  TSH wnl - 3.458 on 5/14 Med rec: Synthroid 150 mcg daily  - resumed Synthroid   Safety concerns Patient shared with me about home safety concerns (see previous note for details)  - TOC consult placed   FEN/GI: - Electrolytes: replete PRN - SLP consulted- advancing to Heart healthy, carb modified die   Prophylaxis: Lovenox   Status is: Inpatient  Remains inpatient appropriate because:Altered mental status, Ongoing diagnostic testing needed not appropriate for outpatient work up, Unsafe d/c plan, IV treatments appropriate due to intensity of illness or inability to take PO and Inpatient level of care appropriate due to severity of illness  Dispo: The patient is from: Home  Anticipated d/c is to: TBD  Patient currently is not medically stable to d/c.              Difficult to place patient No  Subjective:   No acute events overnight. Patient very talkative this morning. She tearful and states she is trying to figure out her next move and where she will stay after leaving. She was able to recall her mood medications and I told her we would be starting those back. Denied any concerns or complaints.    Objective: Temp:  [98.2 F (36.8 C)-100.2 F (37.9 C)] 98.5 F (36.9 C) (05/18 0740) Pulse Rate:  [92-103] 93 (05/17 2258) Resp:  [18-20] 18 (05/18 0740) BP: (95-156)/(61-110) 130/110 (05/18 0740) SpO2:  [93 %-99 %] 98 % (05/18  0740) Weight:  [106.6 kg] 106.6 kg (05/18 0740) Physical Exam: General: alert, sitting in bed eating breakfast, NAD Cardiovascular: RRR no murmurs Respiratory: CTAB normal WOB Abdomen: soft, non distended, non tender Extremities: warm, dry.  Laboratory: Recent Labs  Lab 12/14/20 0850 12/15/20 0216 12/16/20 0343  WBC 23.9* 14.6* 10.5  HGB 12.4 13.4 13.3  HCT 40.7 44.0 44.5  PLT 193 PLATELET CLUMPS NOTED ON  SMEAR, UNABLE TO ESTIMATE 166   Recent Labs  Lab 12/13/20 0412 12/13/20 0500 12/14/20 0337 12/14/20 1115 12/16/20 0343 12/16/20 1455 12/17/20 0256  NA 135   < > 142   < > 141 139 141  K 4.3   < > 4.3   < > 2.9* 3.4* 3.4*  CL 96*   < > 114*   < > 111 106 107  CO2 <7*   < > 15*   < > 25 22 26   BUN 77*   < > 27*   < > 8 5* 6  CREATININE 2.48*   < > 1.09*   < > 0.55 0.53 0.55  CALCIUM 8.2*   < > 8.0*   < > 8.5* 8.4* 8.4*  PROT 6.7  --  5.6*  --   --   --   --   BILITOT 3.7*  --  1.0  --   --   --   --   ALKPHOS 92  --  76  --   --   --   --   ALT 15  --  15  --   --   --   --   AST 12*  --  14*  --   --   --   --   GLUCOSE 924*   < > 407*   < > 125* 282* 149*   < > = values in this interval not displayed.    Imaging/Diagnostic Tests: None new  Shary Key, DO 12/17/2020, 8:11 AM PGY-1, [place] Intern pager: [phone], text pages welcome

## 2020-12-18 DIAGNOSIS — E1169 Type 2 diabetes mellitus with other specified complication: Secondary | ICD-10-CM

## 2020-12-18 DIAGNOSIS — F39 Unspecified mood [affective] disorder: Secondary | ICD-10-CM

## 2020-12-18 DIAGNOSIS — Z794 Long term (current) use of insulin: Secondary | ICD-10-CM

## 2020-12-18 DIAGNOSIS — I2510 Atherosclerotic heart disease of native coronary artery without angina pectoris: Secondary | ICD-10-CM

## 2020-12-18 LAB — CULTURE, BLOOD (ROUTINE X 2)
Culture: NO GROWTH
Culture: NO GROWTH

## 2020-12-18 LAB — CBC
HCT: 40.7 % (ref 36.0–46.0)
Hemoglobin: 12.4 g/dL (ref 12.0–15.0)
MCH: 19.8 pg — ABNORMAL LOW (ref 26.0–34.0)
MCHC: 30.5 g/dL (ref 30.0–36.0)
MCV: 64.9 fL — ABNORMAL LOW (ref 80.0–100.0)
Platelets: 208 10*3/uL (ref 150–400)
RBC: 6.27 MIL/uL — ABNORMAL HIGH (ref 3.87–5.11)
RDW: 17.2 % — ABNORMAL HIGH (ref 11.5–15.5)
WBC: 12.3 10*3/uL — ABNORMAL HIGH (ref 4.0–10.5)
nRBC: 0.3 % — ABNORMAL HIGH (ref 0.0–0.2)

## 2020-12-18 LAB — GLUCOSE, CAPILLARY
Glucose-Capillary: 195 mg/dL — ABNORMAL HIGH (ref 70–99)
Glucose-Capillary: 154 mg/dL — ABNORMAL HIGH (ref 70–99)
Glucose-Capillary: 148 mg/dL — ABNORMAL HIGH (ref 70–99)
Glucose-Capillary: 207 mg/dL — ABNORMAL HIGH (ref 70–99)
Glucose-Capillary: 216 mg/dL — ABNORMAL HIGH (ref 70–99)
Glucose-Capillary: 79 mg/dL (ref 70–99)

## 2020-12-18 LAB — BASIC METABOLIC PANEL
Anion gap: 5 (ref 5–15)
BUN: <5 mg/dL — ABNORMAL LOW (ref 6–20)
CO2: 26 mmol/L (ref 22–32)
Calcium: 8.2 mg/dL — ABNORMAL LOW (ref 8.9–10.3)
Chloride: 105 mmol/L (ref 98–111)
Creatinine, Ser: 0.44 mg/dL (ref 0.44–1.00)
GFR, Estimated: >60 mL/min (ref >60)
Glucose, Bld: 169 mg/dL — ABNORMAL HIGH (ref 70–99)
Potassium: 3.3 mmol/L — ABNORMAL LOW (ref 3.5–5.1)
Sodium: 136 mmol/L (ref 135–145)

## 2020-12-18 LAB — MAGNESIUM: Magnesium: 1.7 mg/dL (ref 1.7–2.4)

## 2020-12-18 LAB — PHOSPHORUS: Phosphorus: 3.2 mg/dL (ref 2.5–4.6)

## 2020-12-18 MED ORDER — ASPIRIN 81 MG PO CHEW
81.0000 mg | CHEWABLE_TABLET | Freq: Every day | ORAL | Status: DC
  Administered 2020-12-18 – 2020-12-19 (×2): 81 mg via ORAL
  Filled 2020-12-18 (×2): qty 1

## 2020-12-18 MED ORDER — MAGIC MOUTHWASH
2.0000 mL | Freq: Four times a day (QID) | ORAL | Status: DC | PRN
  Administered 2020-12-18 (×2): 2 mL via ORAL
  Filled 2020-12-18 (×3): qty 5

## 2020-12-18 MED ORDER — POTASSIUM CHLORIDE CRYS ER 20 MEQ PO TBCR
40.0000 meq | EXTENDED_RELEASE_TABLET | Freq: Once | ORAL | Status: AC
  Administered 2020-12-18: 40 meq via ORAL

## 2020-12-18 MED ORDER — POTASSIUM CHLORIDE CRYS ER 20 MEQ PO TBCR
20.0000 meq | EXTENDED_RELEASE_TABLET | Freq: Every day | ORAL | Status: DC
  Administered 2020-12-18 – 2020-12-19 (×2): 20 meq via ORAL
  Filled 2020-12-18 (×2): qty 1

## 2020-12-18 MED ORDER — LAMOTRIGINE 100 MG PO TABS
100.0000 mg | ORAL_TABLET | Freq: Every day | ORAL | Status: DC
  Administered 2020-12-18 – 2020-12-19 (×2): 100 mg via ORAL
  Filled 2020-12-18 (×2): qty 1

## 2020-12-18 MED ORDER — FUROSEMIDE 20 MG PO TABS
20.0000 mg | ORAL_TABLET | Freq: Two times a day (BID) | ORAL | Status: DC
  Administered 2020-12-18 – 2020-12-19 (×2): 20 mg via ORAL
  Filled 2020-12-18 (×3): qty 1

## 2020-12-18 MED ORDER — MAGNESIUM SULFATE 2 GM/50ML IV SOLN
2.0000 g | Freq: Once | INTRAVENOUS | Status: AC
  Administered 2020-12-18: 2 g via INTRAVENOUS
  Filled 2020-12-18: qty 50

## 2020-12-18 NOTE — Plan of Care (Signed)

## 2020-12-18 NOTE — Progress Notes (Signed)
Occupational Therapy Treatment Patient Details Name: Morgan [name] MRN: [id number] DOB: 1966/05/01 Today's Date: 12/18/2020    History of present illness 55 y.o. female presented on 5/14 with encephalopathy.  She was found to be in DKA in the ED with blood glucose in the 900s and a pH of 7.1.  PMH diabetes, hypertension, hyperlipidemia, hypothyroidism, ?bipolar, CVA with residual balance deficits   OT comments  Patient making great progress toward goals. Current goals met and updated to reflect patient progress. OT treatment session with focus on self-care re-education, ADL transfers, and cognition. Patient completed LB bathing/dressing at EOB with Min guard for safety and transfer to recliner with supervision A without AD (patient declined use of rolling walker for "short-distances"). Patient seemingly close to cognitive baseline with ability to verbally recall technique for managing home medications with use of pillbox and ability to follow multi-step verbal commands with 100% accuracy. OT will continue to follow acutely.    Follow Up Recommendations  No OT follow up    Equipment Recommendations  3 in 1 bedside commode    Recommendations for Other Services      Precautions / Restrictions Precautions Precautions: Fall Restrictions Weight Bearing Restrictions: No       Mobility Bed Mobility Overal bed mobility: Needs Assistance Bed Mobility: Supine to Sit     Supine to sit: Supervision     General bed mobility comments: Supervision A for safety.    Transfers Overall transfer level: Needs assistance Equipment used: None Transfers: Sit to/from Stand Sit to Stand: Supervision         General transfer comment: Patient declined use of RW. Supervision A for safety.    Balance Overall balance assessment: Needs assistance Sitting-balance support: No upper extremity supported;Feet supported Sitting balance-Leahy Scale: Good Sitting balance - Comments: Able to don  footwear seated EOB without LOB.   Standing balance support: No upper extremity supported Standing balance-Leahy Scale: Fair                             ADL either performed or assessed with clinical judgement   ADL Overall ADL's : Needs assistance/impaired Eating/Feeding: Independent;Sitting   Grooming: Wash/dry hands;Wash/dry face;Brushing hair;Min guard;Standing Grooming Details (indicate cue type and reason): Completes grooming tasks standing at sink level with supervision A for safety.     Lower Body Bathing: Min guard;Sit to/from stand Lower Body Bathing Details (indicate cue type and reason): Patient able to wash/dry BLE seated EOB with increased time.Declined washing buttocks and perineal area.     Lower Body Dressing: Minimal assistance;Sit to/from stand Lower Body Dressing Details (indicate cue type and reason): Patient able to don footwear seated EOB with set-up assist and increased time. Toilet Transfer: Magazine features editor Details (indicate cue type and reason): Simulated with transfer to recliner. Patient declined use of RW for "short-distances".         Functional mobility during ADLs: Min guard General ADL Comments: Noted cognition has greatly improved. Patient following 1-2 step verbal commands with 100% accuracy.     Vision       Perception     Praxis      Cognition Arousal/Alertness: Awake/alert Behavior During Therapy: Flat affect Overall Cognitive Status: Impaired/Different from baseline Area of Impairment: Attention;Problem solving                   Current Attention Level: Selective         Problem Solving:  Slow processing;Requires verbal cues          Exercises     Shoulder Instructions       General Comments      Pertinent Vitals/ Pain       Pain Assessment: No/denies pain  Home Living                                          Prior Functioning/Environment               Frequency  Min 2X/week        Progress Toward Goals  OT Goals(current goals can now be found in the care plan section)  Progress towards OT goals: Progressing toward goals  Acute Rehab OT Goals Patient Stated Goal: be  able to climb steps without a rail with cane and confidence OT Goal Formulation: With patient Time For Goal Achievement: 12/29/20 Potential to Achieve Goals: Good ADL Goals Additional ADL Goal #1: Patient will complete ADLs with Mod I and AD/DME as needed. Additional ADL Goal #2: Patient will recall 3 energy conservation techniques in prep for ADLs/IADLs.  Plan Discharge plan remains appropriate;Frequency remains appropriate    Co-evaluation                 AM-PAC OT "6 Clicks" Daily Activity     Outcome Measure   Help from another person eating meals?: None Help from another person taking care of personal grooming?: A Little Help from another person toileting, which includes using toliet, bedpan, or urinal?: A Little Help from another person bathing (including washing, rinsing, drying)?: A Little Help from another person to put on and taking off regular upper body clothing?: A Little Help from another person to put on and taking off regular lower body clothing?: A Little 6 Click Score: 19    End of Session Equipment Utilized During Treatment: Gait belt  OT Visit Diagnosis: Unsteadiness on feet (R26.81);Other abnormalities of gait and mobility (R26.89);Muscle weakness (generalized) (M62.81);Other symptoms and signs involving cognitive function   Activity Tolerance Patient tolerated treatment well   Patient Left in chair;with call bell/phone within reach;with chair alarm set   Nurse Communication Mobility status        Time: 4742-5956 OT Time Calculation (min): 33 min  Charges: OT General Charges $OT Visit: 1 Visit OT Treatments $Self Care/Home Management : 23-37 mins  Shacoya Burkhammer H. OTR/L Supplemental OT, Department of rehab services  [phone]   Carolene Gitto R H. 12/18/2020, 1:58 PM

## 2020-12-18 NOTE — Plan of Care (Signed)

## 2020-12-18 NOTE — Progress Notes (Signed)
Pt afternoon lunch tray consisted of mac and cheese, pizza and chocolate. Pt c/o heartburn and had an inc episode watery bm.

## 2020-12-18 NOTE — Progress Notes (Addendum)
Family Medicine Teaching Service Daily Progress Note Intern Pager: [phone]  Patient name: Morgan [name] Medical record number: [id number] Date of birth: Feb 17, 1966 Age: 55 y.o. Gender: female  Primary Care Provider: [name], DO Consultants: Neurology  Code Status: Full   Pt Overview and Major Events to Date:  12/13/20: admitted for Hyperglycemia and Altered Mental Status 12/13/20: Transitioned to subcutaneous insulin. Foley placed 12/16/20: Foley removed   Assessment and Plan: Morgan [name] is a 55 y.o. female who presented w/ altered mental status and DKA, with DKA resolved and still encephalopathic.  Past medical history significant for type 2 diabetes and previous admissions for DKA, PUD and esophagitis, chronic leukocytosis, CVA, NSTEMI, CAD status post stent, hypertension, hypothyroidism, bipolar, anxiety.   Type II DM, poorly controlled  CBG this am 154. Required 40u Lantus, 18u Novolog.  -CBG with meals and at night + modSSI  - Meal coverage 5 units Novolog TID  - Con't 40 u Lantus  - Regular diet  -Close o/p f/u, CCM candidate   Hypokalemia K+ 3.3 this am. Replenished with 40 meq  KCl - continue to monitor    HTN CAD s/p stent, BP 135/80 this morning  Med rec: ASA 81, Losartan 100mg , atorvastatin 80 mg, Metoprolol 50 mg BID (last ordered 08/19/20).  - Restarted losartan, atorvastatin, metoprolol  +FOBT , Hx of PUD Last EGD in 2019 without esophageal varices. Hgb has been stable. Med rec: home protonix 40 mg BID  -Continue protonix 40 mg BID   Bipolar Disorder  Med rec: lamictal 200 mg daily, topamax 75 mg, trazadone 50 mg, venlafaxine XR 150 mg daily, abilify 5 mg daily.  - Psych consulted and signed off  - Restarted venlafaxine, Abilify    - will restart Lamictal       - ensure outpatient follow up with psychiatrist    Hypothyroidism  TSH wnl - 3.458 on 5/14 Med rec: Synthroid 150 mcg daily  - resumed Synthroid   CAD s/p stent - restarted  aspirin 81mg  daily   Mouth ulcers Patient states she noticed ulcers when she "woke up" here at the hospital about 3 days ago. States she feels it is improving. On Exam visualized 2 ulcers at the posterior palate.  - Magic mouthwash qid prn   Safety concerns Patient shared with me about home safety concerns (see previous note for details)  - TOC consult placed   FEN/GI: - Electrolytes: replete PRN - SLP consulted- advancing to Heart healthy, carb modified die   Prophylaxis: Lovenox   Status is: Inpatient  Remains inpatient appropriate because:Altered mental status, Ongoing diagnostic testing needed not appropriate for outpatient work up, Unsafe d/c plan, IV treatments appropriate due to intensity of illness or inability to take PO and Inpatient level of care appropriate due to severity of illness  Dispo: The patient is from: Home  Anticipated d/c is to: TBD  Patient currently is medically stable to d/c.              Difficult to place patient No  Subjective:   No acute events overnight. Patient endorses feeling better, and states the ulcers on the back of her mouth are improving. States she had an episode of diarrhea over night. States she is still trying to figure out where she is living after this, but definitely will not be returning to live with son.   Objective: Temp:  [98.2 F (36.8 C)-98.5 F (36.9 C)] 98.3 F (36.8 C) (05/19 0418) Pulse Rate:  [74-91]  75 (05/19 0418) Resp:  [15-19] 18 (05/19 0418) BP: (119-144)/(65-110) 135/80 (05/19 0418) SpO2:  [95 %-98 %] 95 % (05/19 0418) Weight:  [106.6 kg] 106.6 kg (05/18 0740) Physical Exam: General: alert, sitting in bed, NAD Cardiovascular: RRR no murmurs Respiratory: CTAB normal WOB Abdomen: soft, non distended, non tender  Extremities: warm, dry  Laboratory: Recent Labs  Lab 12/14/20 0850 12/15/20 0216 12/16/20 0343  WBC 23.9* 14.6* 10.5  HGB 12.4 13.4 13.3  HCT 40.7 44.0 44.5  PLT  193 PLATELET CLUMPS NOTED ON SMEAR, UNABLE TO ESTIMATE 166   Recent Labs  Lab 12/13/20 0412 12/13/20 0500 12/14/20 0337 12/14/20 1115 12/16/20 1455 12/17/20 0256 12/18/20 0340  NA 135   < > 142   < > 139 141 136  K 4.3   < > 4.3   < > 3.4* 3.4* 3.3*  CL 96*   < > 114*   < > 106 107 105  CO2 <7*   < > 15*   < > 22 26 26   BUN 77*   < > 27*   < > 5* 6 <5*  CREATININE 2.48*   < > 1.09*   < > 0.53 0.55 0.44  CALCIUM 8.2*   < > 8.0*   < > 8.4* 8.4* 8.2*  PROT 6.7  --  5.6*  --   --   --   --   BILITOT 3.7*  --  1.0  --   --   --   --   ALKPHOS 92  --  76  --   --   --   --   ALT 15  --  15  --   --   --   --   AST 12*  --  14*  --   --   --   --   GLUCOSE 924*   < > 407*   < > 282* 149* 169*   < > = values in this interval not displayed.    Imaging/Diagnostic Tests: None new  Shary Key, DO 12/18/2020, 7:07 AM PGY-1, [place] Intern pager: [phone], text pages welcome

## 2020-12-18 NOTE — Progress Notes (Signed)
Physical Therapy Treatment Patient Details Name: Morgan [name] MRN: [id number] DOB: 1965/11/28 Today's Date: 12/18/2020    History of Present Illness 55 y.o. female presented on 5/14 with encephalopathy.  She was found to be in DKA in the ED with blood glucose in the 900s and a pH of 7.1.  PMH diabetes, hypertension, hyperlipidemia, hypothyroidism, ?bipolar, CVA with residual balance deficits    PT Comments    Patient making good progress including able to complete 6 steps with rail and cane. Will continue to practice steps to ensure pt can climb steps with cane only.    Follow Up Recommendations  No PT follow up     Equipment Recommendations  None recommended by PT    Recommendations for Other Services       Precautions / Restrictions Precautions Precautions: Fall Restrictions Weight Bearing Restrictions: No    Mobility  Bed Mobility Overal bed mobility: Needs Assistance Bed Mobility: Supine to Sit     Supine to sit: Supervision     General bed mobility comments: Supervision A for safety.    Transfers Overall transfer level: Modified independent Equipment used: None;Rolling walker (2 wheeled);4-wheeled walker Transfers: Sit to/from Stand Sit to Stand: Supervision;Modified independent (Device/Increase time)         General transfer comment: Patient declined use of RW. Supervision A for safety.  Ambulation/Gait Ambulation/Gait assistance: Supervision Gait Distance (Feet): 180 Feet Assistive device: Rolling walker (2 wheeled) Gait Pattern/deviations: Step-through pattern;Decreased stride length     General Gait Details: steady with use of RW; feels she is too unsteady to attempt without device   Stairs Stairs: Yes Stairs assistance: Min guard Stair Management: One rail Left;With cane;Step to pattern;Forwards Number of Stairs: 6 (1 x 6 reps using counter at sink as a rail)     Wheelchair Mobility    Modified Rankin (Stroke Patients Only)        Balance Overall balance assessment: Needs assistance Sitting-balance support: No upper extremity supported;Feet supported Sitting balance-Leahy Scale: Good Sitting balance - Comments: Able to don footwear seated EOB without LOB.   Standing balance support: No upper extremity supported Standing balance-Leahy Scale: Fair                              Cognition Arousal/Alertness: Awake/alert Behavior During Therapy: Flat affect Overall Cognitive Status: Impaired/Different from baseline Area of Impairment: Attention;Problem solving                   Current Attention Level: Selective Memory: Decreased short-term memory Following Commands: Follows one step commands with increased time   Awareness: Intellectual Problem Solving: Slow processing;Requires verbal cues General Comments: able to state when she thought she was at her limit for climbing step and later with ambulation      Exercises      General Comments        Pertinent Vitals/Pain Pain Assessment: No/denies pain    Home Living                      Prior Function            PT Goals (current goals can now be found in the care plan section) Acute Rehab PT Goals Patient Stated Goal: be  able to climb steps without a rail with cane and confidence Time For Goal Achievement: 12/30/20 Potential to Achieve Goals: Good Progress towards PT goals: Progressing toward goals  Frequency    Min 3X/week      PT Plan Discharge plan needs to be updated    Co-evaluation              AM-PAC PT "6 Clicks" Mobility   Outcome Measure  Help needed turning from your back to your side while in a flat bed without using bedrails?: A Little Help needed moving from lying on your back to sitting on the side of a flat bed without using bedrails?: A Little Help needed moving to and from a bed to a chair (including a wheelchair)?: A Little Help needed standing up from a chair using your arms  (e.g., wheelchair or bedside chair)?: A Little Help needed to walk in hospital room?: A Little Help needed climbing 3-5 steps with a railing? : A Little 6 Click Score: 18    End of Session Equipment Utilized During Treatment: Gait belt Activity Tolerance: Patient tolerated treatment well Patient left: in chair;with call bell/phone within reach Nurse Communication: Mobility status PT Visit Diagnosis: Unsteadiness on feet (R26.81);Other abnormalities of gait and mobility (R26.89);Muscle weakness (generalized) (M62.81)     Time: 8101-7510 PT Time Calculation (min) (ACUTE ONLY): 21 min  Charges:  $Gait Training: 8-22 mins                      Morgan [name], PT Pager [phone]    Morgan [name] 12/18/2020, 4:17 PM

## 2020-12-19 ENCOUNTER — Other Ambulatory Visit (HOSPITAL_COMMUNITY): Payer: Self-pay

## 2020-12-19 DIAGNOSIS — E1165 Type 2 diabetes mellitus with hyperglycemia: Secondary | ICD-10-CM

## 2020-12-19 LAB — BASIC METABOLIC PANEL
Anion gap: 9 (ref 5–15)
BUN: 5 mg/dL — ABNORMAL LOW (ref 6–20)
CO2: 26 mmol/L (ref 22–32)
Calcium: 8.3 mg/dL — ABNORMAL LOW (ref 8.9–10.3)
Chloride: 102 mmol/L (ref 98–111)
Creatinine, Ser: 0.57 mg/dL (ref 0.44–1.00)
GFR, Estimated: >60 mL/min (ref >60)
Glucose, Bld: 220 mg/dL — ABNORMAL HIGH (ref 70–99)
Potassium: 3.9 mmol/L (ref 3.5–5.1)
Sodium: 137 mmol/L (ref 135–145)

## 2020-12-19 LAB — GLUCOSE, CAPILLARY
Glucose-Capillary: 190 mg/dL — ABNORMAL HIGH (ref 70–99)
Glucose-Capillary: 211 mg/dL — ABNORMAL HIGH (ref 70–99)
Glucose-Capillary: 289 mg/dL — ABNORMAL HIGH (ref 70–99)

## 2020-12-19 MED ORDER — PANTOPRAZOLE SODIUM 20 MG PO TBEC
20.0000 mg | DELAYED_RELEASE_TABLET | Freq: Every day | ORAL | 0 refills | Status: AC
  Filled 2020-12-19: qty 30, 30d supply, fill #0

## 2020-12-19 MED ORDER — INSULIN LISPRO (1 UNIT DIAL) 100 UNIT/ML (KWIKPEN): 10.0000 [IU] | PEN_INJECTOR | Freq: Three times a day (TID) | SUBCUTANEOUS | 11 refills | Status: DC

## 2020-12-19 MED ORDER — ASPIRIN 81 MG PO CHEW
81.0000 mg | CHEWABLE_TABLET | Freq: Every day | ORAL | 0 refills | Status: AC
  Filled 2020-12-19: qty 90, 90d supply, fill #0

## 2020-12-19 MED ORDER — BASAGLAR KWIKPEN 100 UNIT/ML ~~LOC~~ SOPN
45.0000 [IU] | PEN_INJECTOR | Freq: Every day | SUBCUTANEOUS | 3 refills | Status: AC
  Filled 2020-12-19: qty 15, 30d supply, fill #0

## 2020-12-19 MED ORDER — MAGIC MOUTHWASH
2.0000 mL | Freq: Four times a day (QID) | ORAL | 0 refills | Status: AC
  Filled 2020-12-19: qty 10, 2d supply, fill #0

## 2020-12-19 MED ORDER — INSULIN PEN NEEDLE 32G X 4 MM MISC
1.0000 | Freq: Every day | 11 refills | Status: AC
  Filled 2020-12-19: qty 200, 25d supply, fill #0

## 2020-12-19 MED ORDER — MAGIC MOUTHWASH
2.0000 mL | Freq: Four times a day (QID) | ORAL | Status: DC
  Administered 2020-12-19: 2 mL via ORAL
  Filled 2020-12-19 (×4): qty 5

## 2020-12-19 NOTE — Discharge Instructions (Signed)
Dear Morgan [name],   Thank you so much for allowing [place] to be part of your care!  You were admitted to [facility] for Diabetic Ketoacidosis with altered mental status. We are glad you are doing much better!   We have made a couple changes to your medication: - Lantus insulin: take 45u at night - Novolog: 10 units with meals  - We reduced your protonix to 20mg  once a day. Pleas take for no more than 1 month   Please be sure to attend your PCP appointment below and also follow up with GI outpatient   POST-HOSPITAL & CARE INSTRUCTIONS 1. Please let PCP/Specialists know of any changes that were made.  2. Please see medications section of this packet for any medication changes.   DOCTOR'S APPOINTMENT & FOLLOW UP CARE INSTRUCTIONS  Future Appointments  Date Time Provider Jette  12/25/2020  8:50 AM [name], DO [facility]  01/05/2021 11:50 AM [facility] 2 [facility]    RETURN PRECAUTIONS:   Take care and be well!  [facility]  [place], [place] [address] [phone]

## 2020-12-19 NOTE — TOC Progression Note (Addendum)
Transition of Care [facility]) - Progression Note    Patient Details  Name: [name] MRN: [id number] Date of Birth: Sep 12, 1965  [facility] of [facility]) CM/SW Contact  Joanne Chars, LCSW Phone Number: 12/19/2020, 10:00 AM  Clinical Narrative:    PT not longer recommending HH, [facility] informed.  CSW spoke with pt regarding housing plans.  Pt initially stated she planned to work on the housing problem today.  CSW told pt that MD informed me that she is ready for DC today and we had discussed her working on housing Wed night and yesterday.  Pt said MD also informed her of DC today.  CSW again discussed pursuing a restraining order against her son.  Pt reports her son called this morning and "threatened me that I needed to improve my attitude."  Pt states that even if she obtained a restraining order, she does not believe the police would protect her form her son.  After more discussion, pt declines to pursue this option.  Pt attempted to call daughter in law while CSW was in room, no answer.  Pt sister from [place] called while CSW was in the room, after this call pt reports that she is not interested in going to stay with her sister but that her sister will attempt to help her contact her daughter in law about staying there.  Discussed option of staying in a hotel, pt initially said she cannot afford this, but then said she is going to look into this option.  CSW asked about transportation and pt said she may need help with this as well.  CSW will check back with pt around lunchtime to see what she has worked out.    1200: Pt reports she is going to be staying with an exboyfriend near [place].  This person does have a car, someone has it at the dentist office currently, but he will be coming to get her.  Pt declines 3n1 DME recommendation.    Expected Discharge Plan:  (TBD) Barriers to Discharge: Continued Medical Work up,Other (must enter comment) (APS issues)  Expected  Discharge Plan and Services Expected Discharge Plan:  (TBD) In-house Referral: Clinical Social Work   Post Acute Care Choice:  (TBD) Living arrangements for the past 2 months: Single Family Home                                       Social Determinants of Health (SDOH) Interventions    Readmission Risk Interventions Readmission Risk Prevention Plan 10/29/2018  Transportation Screening Complete  PCP or Specialist Appt within 3-5 Days Complete  HRI or [place] Complete  Social Work Consult for [place] Planning/Counseling Complete  Palliative Care Screening Not Applicable  Medication Review [employer] photographer) Complete  Some recent data might be hidden

## 2020-12-19 NOTE — Clinical Note (Incomplete)
Family Medicine Teaching Service Daily Progress Note Intern Pager: [phone]  Patient name: Morgan [name] Medical record number: [id number] Date of birth: September 28, 1965 Age: 55 y.o. Gender: female  Primary Care Provider: [name], DO Consultants: Neurology  Code Status: Full   Pt Overview and Major Events to Date:  ***  Assessment and Plan: Morgan [name] is a 55 y.o. female who presented w/ altered mental status and DKA that has resolved.  Past medical history significant for type 2 diabetes and previous admissions for DKA, PUD and esophagitis, chronic leukocytosis, CVA, NSTEMI, CAD status post stent, hypertension, hypothyroidism, bipolar, anxiety.   Type 2 diabetes, poorly controlled CBG this morning 190, random glucose 220.  Required 40 units Lantus, 27 units NovoLog - CBG with meals and at night - Mod SSI - Continue Lantus 40 units - Meal coverage NovoLog 5 units 3 times daily - Regular diet  Hypokalemia K+ 3.9 - Continue to monitor  FEN/GI: *** PPx: ***  Disposition: ***  Subjective:  ***  Objective: Temp:  [97.8 F (36.6 C)-99 F (37.2 C)] 99 F (37.2 C) (05/20 0303) Pulse Rate:  [65-79] 79 (05/20 0303) Resp:  [17-20] 18 (05/20 0303) BP: (103-150)/(53-99) 150/78 (05/20 0303) SpO2:  [93 %-99 %] 93 % (05/20 0303) Weight:  [104.3 kg] 104.3 kg (05/20 0302) Physical Exam: General: *** Cardiovascular: *** Respiratory: *** Abdomen: *** Extremities: ***  Laboratory: Recent Labs  Lab 12/15/20 0216 12/16/20 0343 12/18/20 1117  WBC 14.6* 10.5 12.3*  HGB 13.4 13.3 12.4  HCT 44.0 44.5 40.7  PLT PLATELET CLUMPS NOTED ON SMEAR, UNABLE TO ESTIMATE 166 208   Recent Labs  Lab 12/13/20 0412 12/13/20 0500 12/14/20 0337 12/14/20 1115 12/17/20 0256 12/18/20 0340 12/19/20 0515  NA 135   < > 142   < > 141 136 137  K 4.3   < > 4.3   < > 3.4* 3.3* 3.9  CL 96*   < > 114*   < > 107 105 102  CO2 <7*   < > 15*   < > 26 26 26   BUN 77*   < > 27*   < > 6 <5*  5*  CREATININE 2.48*   < > 1.09*   < > 0.55 0.44 0.57  CALCIUM 8.2*   < > 8.0*   < > 8.4* 8.2* 8.3*  PROT 6.7  --  5.6*  --   --   --   --   BILITOT 3.7*  --  1.0  --   --   --   --   ALKPHOS 92  --  76  --   --   --   --   ALT 15  --  15  --   --   --   --   AST 12*  --  14*  --   --   --   --   GLUCOSE 924*   < > 407*   < > 149* 169* 220*   < > = values in this interval not displayed.    ***  Imaging/Diagnostic Tests: ***  Shary Key, DO 12/19/2020, 7:13 AM PGY-***, [place] Intern pager: [phone], text pages welcome

## 2020-12-19 NOTE — TOC Transition Note (Signed)
Transition of Care [facility]) - CM/SW Discharge Note   Patient Details  Name: Morgan [name] MRN: [id number] Date of Birth: November 10, 1965  [facility] CM/SW Contact:  Joanne Chars, LCSW Phone Number: 12/19/2020, 12:13 PM   Clinical Narrative:   Pt is discharging to a friend's home near [place].  Please see previous notes for details.  Pt friend will pick her up later today.  Pt declines recommended 3n1, no other needs identified.     Final next level of care: Home/Self Care Barriers to Discharge: Barriers Resolved   Patient Goals and CMS Choice        Discharge Placement                       Discharge Plan and Services In-house Referral: Clinical Social Work   Post Acute Care Choice:  (TBD)          DME Arranged: N/A         HH Arranged: NA          Social Determinants of Health (SDOH) Interventions     Readmission Risk Interventions Readmission Risk Prevention Plan 12/19/2020 10/29/2018  Transportation Screening Complete Complete  PCP or Specialist Appt within 3-5 Days Not Complete Complete  Not Complete comments Pt will be staying out of town at discharge, not sure what her plans are longer term.  Aware that she needs PCP follow up. -  [place] or Home Care Consult Complete Complete  Social Work Consult for [place] Planning/Counseling Complete Complete  Palliative Care Screening Not Applicable Not Applicable  Medication Review [employer]) - Complete  Some recent data might be hidden

## 2020-12-19 NOTE — Discharge Summary (Addendum)
I have reviewed the below documentation. I have seen the patient on the day of discharge and agree with the below exam.  In addition to below, I recommend the following at follow up:  Airspace disease on CT, no symptoms during stay, monitor. Consider repeat chest x-ray as outpatient.   Polypharmacy recommending using caution with multiple agents, suspect one component of her presentation due to medication overuse.   Social Safety, again offered to help patient with restraint order. She declined. Safe disposition with patient planned---to [facility] with friend. Reports she has medications available to her.   Hematemesis, no recurrence, multiple normal stools, CBC stable at 12.4 on 5/19 (stable for entire week). Restarted aspirin, will need GI follow up. Suspect esophagitis/gastritis due to vomiting.   Agree--repeat BMP and CBC at follow up.   Morgan Singh, MD  [facility] Discharge Summary  Patient name: Morgan [name] Medical record number: [id number] Date of birth: 12/02/1965 Age: 55 y.o. Gender: female Date of Admission: 12/13/2020  Date of Discharge: 12/19/20 Admitting Physician: Lenoria Chime, MD  Primary Care Provider: [name], DO Consultants: Neurology   Indication for Hospitalization: DKA and altered mental status   Discharge Diagnoses/Problem List:   Active Problems:   Tachycardia   DKA (diabetic ketoacidosis) ([place])   Hematemesis   Encephalopathy   Somnolence  Disposition: Home   Discharge Condition: Stable   Discharge Exam:   Temp:  [97.8 F (36.6 C)-99 F (37.2 C)] 99 F (37.2 C) (05/20 0303) Pulse Rate:  [65-79] 79 (05/20 0303) Resp:  [17-20] 18 (05/20 0303) BP: (103-150)/(53-99) 150/78 (05/20 0303) SpO2:  [93 %-99 %] 93 % (05/20 0303) Weight:  [104.3 kg] 104.3 kg (05/20 0302)  Physical Exam: General: alert, sitting in chair, NAD Cardiovascular: RRR no murmurs   Respiratory: CTAB normal WOB  Abdomen: soft, non distended, non tender  Extremities: warm, dry   Brief Hospital Course:   Morgan [name] is a 55 y.o. female who presented w/ altered mental status and DKA.  DKA likely due to nonadherence to insulin in setting of altered mental status. Toxic encephalopathy felt to most likely be due to excess use of trazodone in addition to multiple other medications.  . Past medical history significant for type 2 diabetes and previous admissions for DKA, PUD and esophagitis, chronic leukocytosis, CVA, NSTEMI, CAD status post stent, hypertension, hypothyroidism, bipolar, anxiety.   Encephalopathy On admission, patient was not able to give a history due to AMS due to her DKA. Initial work-up significant for negative CT head (but with small  remote cerberallar infarct not seen two weeks prior when seen in ED for dizziness and multiple falls 11/21/20), negative chest x-ray, normal ammonia, UA negative, UDS positive for amphetamines likely a false positive due to increased Trazodone use. Initially thought to potentially be infectious (markedly elevated WBC) but she improved and antibiotics were stopped.  Though due to continued but improved alteration, MRI obtained on day 2 of hospitalization, which showed no acute infarction, though study limited due to patient not tolerating full exam.   DKA  Type 2 diabetes  Patient started on insulin drip and ultimately transitioned to subcutaneous insulin on the evening of admission. She ultimately discharged on Lantus 45 units at night, and 10 units of short acting Novolog insulin with meals. Her electrolyte abnormalities were also corrected including frequent potassium replacements, and she was discharged on 2mq of potassium to be continued  daily. She was set up with close follow up with her PCP and pharmacist at [facility] to ensure patient is on the correct diabetes regimen.   Concern for hematemasis, + FOBT  On admission, EMS  noted possible tarry stool and hematemesis.  Her admission hemoglobin was normal but was still started on IV Protonix twice daily.  She had an EGD in 2019 that did not show any varices, though she does have a known history of PUD.  Her hemoglobin remained stable during hospitalization.    All other conditions managed were chronic and stable   Issues for Follow Up:  1. F/u with GI outpatient with hx of PUD and esophagitis. Lowered dose of protonix to 33m daily  2. F/u with psych outpatient. Lamictal was restarted at a lower dose, and then to full dose at discharge. Trazodone was held due to potential overdose. She was continued on her other bipolar medication  3. F/u with PCP and pharmacist to ensure appropriate insulin regimen- was discharged on 45u LAntus daily, 10u Novolog with meals but will likely need more as she gets back to her normal routine   Significant Procedures: None   Significant Labs and Imaging:  Recent Labs  Lab 12/15/20 0216 12/16/20 0343 12/18/20 1117  WBC 14.6* 10.5 12.3*  HGB 13.4 13.3 12.4  HCT 44.0 44.5 40.7  PLT PLATELET CLUMPS NOTED ON SMEAR, UNABLE TO ESTIMATE 166 208   Recent Labs  Lab 12/13/20 0412 12/13/20 0500 12/13/20 1943 12/14/20 0337 12/14/20 0850 12/14/20 1115 12/16/20 0343 12/16/20 1231 12/16/20 1455 12/17/20 0256 12/18/20 0340 12/19/20 0515  NA 135   < > 146* 142  --    < > 141  --  139 141 136 137  K 4.3   < > 3.0* 4.3  --    < > 2.9*  --  3.4* 3.4* 3.3* 3.9  CL 96*   < > 121* 114*  --    < > 111  --  106 107 105 102  CO2 <7*   < > 18* 15*  --    < > 25  --  _0 GLUCOSE 924*   < > 220* 407*  --    < > 125*  --  282* 149* 169* 220*  BUN 77*   < > 37* 27*  --    < > 8  --  5* 6 <5* 5*  CREATININE 2.48*   < > 1.04* 1.09*  --    < > 0.55  --  0.53 0.55 0.44 0.57  CALCIUM 8.2*   < > 8.2* 8.0*  --    < > 8.5*  --  8.4* 8.4* 8.2* 8.3*  MG  --    < > 2.4  --   --   --  1.8 1.5*  --  1.9 1.7  --   PHOS  --    < > 2.1*  --  2.7  --    --  1.0*  --  3.1 3.2  --   ALKPHOS 92  --   --  76  --   --   --   --   --   --   --   --   AST 12*  --   --  14*  --   --   --   --   --   --   --   --   ALT 15  --   --  15  --   --   --   --   --   --   --   --   ALBUMIN 3.7  --   --  2.9*  --   --   --   --   --   --   --   --    < > = values in this interval not displayed.   DG Chest 1 View  Result Date: 12/13/2020 CLINICAL DATA:  Hematemesis EXAM: CHEST  1 VIEW COMPARISON:  07/08/2020 FINDINGS: Low volume chest with chronically elevated left diaphragm and a overlying scar. No pulmonary edema, air bronchogram, effusion, or pneumothorax. Normal heart size. Clips at the left supraclavicular fossa which may relate to history of subclavian steal syndrome. Coronary stenting. IMPRESSION: 1. No acute finding. 2. Chronic elevation of the left diaphragm with overlying scar. Electronically Signed   By: Monte Fantasia M.D.   On: 12/13/2020 07:06   DG Abd 1 View  Result Date: 12/13/2020 CLINICAL DATA:  Dehydration and vomiting. EXAM: ABDOMEN - 1 VIEW COMPARISON:  11/24/2019 abdominal CT FINDINGS: Normal bowel gas pattern. No abnormal stool retention. Elevated left diaphragm which is chronic based on prior imaging. Cholecystectomy clips. IMPRESSION: 1. Normal bowel gas pattern. 2. Chronically elevated left diaphragm. Electronically Signed   By: Monte Fantasia M.D.   On: 12/13/2020 07:05   CT Head Wo Contrast  Result Date: 12/13/2020 CLINICAL DATA:  Mental status change with unknown cause. Hyperglycemia EXAM: CT HEAD WITHOUT CONTRAST TECHNIQUE: Contiguous axial images were obtained from the base of the skull through the vertex without intravenous contrast. COMPARISON:  11/21/2020 FINDINGS: Brain: No evidence of acute infarction, hemorrhage, hydrocephalus, extra-axial collection or mass lesion/mass effect. Small remote left cerebellar infarct. Patchy areas of low-density have a band like morphology on reformats and are attributed to artifact. Vascular: No  hyperdense vessel or unexpected calcification. Skull: Normal. Negative for fracture or focal lesion. Sinuses/Orbits: No acute finding. IMPRESSION: 1. Stable compared to prior.  No acute finding. 2. Small remote left cerebellar infarct. Electronically Signed   By: Monte Fantasia M.D.   On: 12/13/2020 07:08   CT Head Wo Contrast  Result Date: 11/21/2020 CLINICAL DATA:  Dizziness, multiple falls EXAM: CT HEAD WITHOUT CONTRAST TECHNIQUE: Contiguous axial images were obtained from the base of the skull through the vertex without intravenous contrast. COMPARISON:  MRI head 01/06/2018 FINDINGS: Brain: No evidence of acute infarction, hemorrhage, hydrocephalus, extra-axial collection or mass lesion/mass effect. Chronic infarct right frontal periventricular white matter unchanged from the prior study. Vascular: Negative for hyperdense vessel Skull: Negative Sinuses/Orbits: Mild mucosal edema paranasal sinuses. Negative orbit Other: None IMPRESSION: No acute abnormality. 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 Electronically Signed   By: Franchot Gallo M.D.   On: 11/21/2020 16:32   CT ANGIO CHEST PE W OR WO CONTRAST  Result Date: 12/14/2020 CLINICAL DATA:  Rule out pulmonary embolus.  High probability. EXAM: CT ANGIOGRAPHY CHEST WITH CONTRAST TECHNIQUE: Multidetector CT imaging of the chest was performed using the standard protocol during bolus administration of intravenous contrast. Multiplanar CT image reconstructions and MIPs were obtained to evaluate the vascular anatomy. CONTRAST:  86m OMNIPAQUE IOHEXOL 350 MG/ML SOLN COMPARISON:  11/24/2019 FINDINGS: Cardiovascular: The heart size appears within normal limits. No pericardial effusion. Aortic atherosclerosis and coronary artery calcifications. The main pulmonary artery appears patent. No lobar or segmental pulmonary artery filling defects identified Mediastinum/Nodes: No discrete thyroid nodule. The trachea appears patent and is midline.  Mild circumferential  wall thickening involving the mid and distal esophagus is identified, nonspecific. There are no enlarged axillary, supraclavicular, mediastinal, or hilar lymph nodes. Lungs/Pleura: Left lung base airspace consolidation. Mild subpleural consolidation is also identified within the posterior right base. Perifissural nodule in the right midlung is identified along the major fissure measuring 5 mm, image 46/10. Upper Abdomen: No acute abnormality. Musculoskeletal: Upper thoracic spine vertebral hemangioma is identified. No acute or suspicious osseous findings identified. Review of the MIP images confirms the above findings. IMPRESSION: 1. No evidence for acute pulmonary embolus. 2. There is progressive airspace consolidation involving the left lower lobe when compared with CT of the abdomen pelvis from 12/03/2020. Correlate for any clinical signs or symptoms of pneumonia. 3. Aortic atherosclerosis and coronary artery calcifications. Aortic Atherosclerosis (ICD10-I70.0). Electronically Signed   By: Kerby Moors M.D.   On: 12/14/2020 13:01   MR BRAIN WO CONTRAST  Result Date: 12/14/2020 CLINICAL DATA:  Mental status change, unknown cause. EXAM: MRI HEAD WITHOUT CONTRAST TECHNIQUE: Multiplanar, multiecho pulse sequences of the brain and surrounding structures were obtained without intravenous contrast. COMPARISON:  Prior head CT examinations 12/13/2020 and earlier. Brain MRI 01/06/2018 FINDINGS: Brain: Due to altered mental status, the patient was unable to tolerate the full examination. Only axial and coronal diffusion-weighted sequences, a sagittal T1 weighted sequence and an axial T2 weighted sequence could be obtained. The sagittal T1 weighted sequence is mildly motion degraded. The axial T2 weighted sequence is moderate to severely motion degraded. Cerebral volume is normal for age. Chronic lacunar infarcts within the bilateral centrum semiovale and corona radiata. Additionally, there is a  redemonstrated small remote infarct within the medial left cerebellar hemisphere. There is no acute infarct. Within described limitations, no intracranial mass or extra-axial fluid collection is identified. No midline shift Vascular: Expected proximal arterial flow voids. Skull and upper cervical spine: No focal marrow lesion. Sinuses/Orbits: Visualized orbits show no acute finding. Trace bilateral ethmoid and right maxillary sinus mucosal thickening. Moderate-sized right maxillary sinus mucous retention cyst. Other: Trace fluid within the right mastoid air cells. IMPRESSION: Due to altered mental status, the patient was unable to tolerate the full examination. Prematurely terminated and motion degraded examination, as described. No evidence of acute infarction. Chronic lacunar infarcts within the bilateral centrum semiovale and corona radiata. Redemonstrated small remote infarct within the medial left cerebellar hemisphere. Paranasal sinus disease, as described. Trace right mastoid effusion. Electronically Signed   By: Kellie Simmering DO   On: 12/14/2020 13:55   CT ABDOMEN PELVIS W CONTRAST  Result Date: 12/13/2020 CLINICAL DATA:  Evaluate for abdominal abscess/infection. Elevated white blood cell count. EXAM: CT ABDOMEN AND PELVIS WITH CONTRAST TECHNIQUE: Multidetector CT imaging of the abdomen and pelvis was performed using the standard protocol following bolus administration of intravenous contrast. CONTRAST:  42m OMNIPAQUE IOHEXOL 300 MG/ML  SOLN COMPARISON:  11/24/2019 FINDINGS: Lower chest: Atelectasis and volume loss is identified within the left lower lobe and lingula. Perifissural nodule is noted along the major fissure measuring 5 mm. Dependent changes are identified within the right base. Hepatobiliary: Tiny low-density structure along the dome of liver measures 5 mm and is unchanged from previous exam. Focal area of fatty deposition is again noted within segment 5 adjacent to the gallbladder fossa. No  suspicious liver lesion identified. Cholecystectomy. No signs of bile duct dilatation. Pancreas: Unremarkable. No pancreatic ductal dilatation or surrounding inflammatory changes. Spleen: Normal in size without focal abnormality. Adrenals/Urinary Tract: Adreniform enlargement of the left adrenal gland appears stable from prior  exam. Right adrenal gland appears normal. No nephrolithiasis, hydronephrosis or mass identified within the kidneys. The urinary bladder is unremarkable for degree of distension. Stomach/Bowel: Stomach appears normal. There is no small bowel wall thickening, inflammation or distension. The appendix is visualized and appears normal. Mild colonic wall thickening is identified. This is nonspecific and may reflect incomplete distention. Uncomplicated pancolitis cannot be excluded. No signs of pneumatosis, bowel perforation or abscess. Vascular/Lymphatic: Aortic atherosclerosis. No aneurysm. No abdominopelvic adenopathy. Reproductive: Uterus and bilateral adnexa are unremarkable. Other: No free fluid or fluid collections. Musculoskeletal: No acute or significant osseous findings. IMPRESSION: 1. No signs of intra-abdominal or pelvic abscess. No focal fluid collections or free fluid noted. 2. Equivocal colonic wall thickening is identified. This is nonspecific and may reflect incomplete distention. Uncomplicated pancolitis cannot be excluded. No signs of pneumatosis, bowel perforation or abscess. Clinical correlation advised. Aortic Atherosclerosis (ICD10-I70.0). Electronically Signed   By: Kerby Moors M.D.   On: 12/13/2020 11:30   EEG adult  Result Date: 12/15/2020 Lora Havens, MD     12/15/2020 10:49 AM Patient Name: MARILEA GWYNNE MRN: [id number] Epilepsy Attending: Lora Havens Referring Physician/Provider: Dr Lyndee Hensen Date: 12/15/2020 Duration: 23.44 mins Patient history: 55yo F with AMS. EEG to evaluate for seizure. Level of alertness: awake, asleep AEDs during EEG study:  None Technical aspects: This EEG study was done with scalp electrodes positioned according to the 10-20 International system of electrode placement. Electrical activity was acquired at a sampling rate of _0  and reviewed with a high frequency filter of _1  and a low frequency filter of _2 . EEG data were recorded continuously and digitally stored. Description: No clear posterior dominant rhythm was seen. EEG showed continuous generalized polymorphic mixed frequencies 3 to 6 Hz theta-delta slowing. Sleep was characterized by sleep spindles (12-_3 ), maximal frontocentral region. Hyperventilation and photic stimulation were not performed.   ABNORMALITY - Continuous slow, generalized IMPRESSION: This study is suggestive of moderate to severe diffuse encephalopathy, nonspecific etiology. No seizures or epileptiform discharges were seen throughout the recording. Lora Havens   ECHOCARDIOGRAM COMPLETE  Result Date: 12/15/2020    ECHOCARDIOGRAM REPORT   Patient Name:   BLANKA ROCKHOLT Date of Exam: 12/15/2020 Medical Rec #:  [id number]       Height:       63.0 in Accession #:    [id number]      Weight:       229.3 lb Date of Birth:  [birth year]       BSA:          2.049 m Patient Age:    69 years        BP:           147/69 mmHg Patient Gender: F               HR:           100 bpm. Exam Location:  Inpatient Procedure: 2D Echo, Cardiac Doppler and Color Doppler Indications:    elevated troponin  History:        Patient has prior history of Echocardiogram examinations, most                 recent 09/23/2020. Stroke; Risk Factors:Dyslipidemia, Diabetes                 and Hypertension.  Sonographer:    [name] Referring Phys: [id number] MARGARET E PRAY  Sonographer Comments: Image acquisition challenging due to patient body habitus  and Image acquisition challenging due to uncooperative patient. IMPRESSIONS  1. Left ventricular ejection fraction, by estimation, is 65 to 70%. The left ventricle has normal  function. The left ventricle has no regional wall motion abnormalities. There is mild left ventricular hypertrophy. Left ventricular diastolic parameters are indeterminate.  2. Right ventricular systolic function is normal. The right ventricular size is normal.  3. No evidence of mitral valve regurgitation.  4. The aortic valve is tricuspid. Aortic valve regurgitation is not visualized. Mild to moderate aortic valve sclerosis/calcification is present, without any evidence of aortic stenosis.  5. The inferior vena cava is dilated in size with <50% respiratory variability, suggesting right atrial pressure of 15 mmHg. Comparison(s): The left ventricular function is unchanged. FINDINGS  Left Ventricle: Left ventricular ejection fraction, by estimation, is 65 to 70%. The left ventricle has normal function. The left ventricle has no regional wall motion abnormalities. The left ventricular internal cavity size was small. There is mild left ventricular hypertrophy. Left ventricular diastolic parameters are indeterminate. Right Ventricle: The right ventricular size is normal. Right vetricular wall thickness was not assessed. Right ventricular systolic function is normal. Left Atrium: Left atrial size was normal in size. Right Atrium: Right atrial size was normal in size. Pericardium: There is no evidence of pericardial effusion. Mitral Valve: Mild mitral annular calcification. No evidence of mitral valve regurgitation. Tricuspid Valve: The tricuspid valve is normal in structure. Tricuspid valve regurgitation is trivial. Aortic Valve: The aortic valve is tricuspid. Aortic valve regurgitation is not visualized. Mild to moderate aortic valve sclerosis/calcification is present, without any evidence of aortic stenosis. Aortic valve mean gradient measures 8.0 mmHg. Aortic valve peak gradient measures 16.5 mmHg. Aortic valve area, by VTI measures 1.54 cm. Pulmonic Valve: The pulmonic valve was normal in structure. Pulmonic valve  regurgitation is not visualized. Aorta: The aortic root and ascending aorta are structurally normal, with no evidence of dilitation. Venous: The inferior vena cava is dilated in size with less than 50% respiratory variability, suggesting right atrial pressure of 15 mmHg. IAS/Shunts: No atrial level shunt detected by color flow Doppler.  LEFT VENTRICLE PLAX 2D LVIDd:         3.65 cm  Diastology LVIDs:         2.35 cm  LV e' medial:    5.33 cm/s LV PW:         1.10 cm  LV E/e' medial:  17.3 LV IVS:        1.15 cm  LV e' lateral:   10.10 cm/s LVOT diam:     1.70 cm  LV E/e' lateral: 9.1 LV SV:         56 LV SV Index:   27 LVOT Area:     2.27 cm  RIGHT VENTRICLE RV S prime:     20.70 cm/s TAPSE (M-mode): 1.9 cm LEFT ATRIUM             Index       RIGHT ATRIUM           Index LA diam:        2.70 cm 1.32 cm/m  RA Area:     11.90 cm LA Vol (A2C):   39.5 ml 19.28 ml/m RA Volume:   24.00 ml  11.71 ml/m LA Vol (A4C):   41.0 ml 20.01 ml/m LA Biplane Vol: 40.3 ml 19.67 ml/m  AORTIC VALVE AV Area (Vmax):    1.60 cm AV Area (Vmean):   1.54 cm AV Area (VTI):  1.54 cm AV Vmax:           203.00 cm/s AV Vmean:          134.000 cm/s AV VTI:            0.363 m AV Peak Grad:      16.5 mmHg AV Mean Grad:      8.0 mmHg LVOT Vmax:         143.00 cm/s LVOT Vmean:        91.200 cm/s LVOT VTI:          0.246 m LVOT/AV VTI ratio: 0.68  AORTA Ao Root diam: 3.00 cm Ao Asc diam:  3.20 cm MITRAL VALVE MV Area (PHT): 4.39 cm     SHUNTS MV Decel Time: 173 msec     Systemic VTI:  0.25 m MV E velocity: 92.00 cm/s   Systemic Diam: 1.70 cm MV A velocity: 131.00 cm/s MV E/A ratio:  0.70 Morgan Carnes MD Electronically signed by Morgan Carnes MD Signature Date/Time: 12/15/2020/4:13:52 PM    Final     Results/Tests Pending at Time of Discharge: None   Discharge Medications:  Allergies as of 12/19/2020      Reactions   Ilosone [erythromycin] Anaphylaxis   Keflex [cephalexin] Anaphylaxis   Penicillins Anaphylaxis   Has patient had a PCN  reaction causing immediate rash, facial/tongue/throat swelling, SOB or lightheadedness with hypotension: YES Has patient had a PCN reaction causing severe rash involving mucus membranes or skin necrosis: NO Has patient had a PCN reaction that required hospitalizationNO Has patient had a PCN reaction occurring within the last 10 years: NO If all of the above answers are "NO", then may proceed with Cephalosporin use.   Iron Other (See Comments)   Pt reports condition limiting Iron intake.   Metformin And Related Other (See Comments)   UNSPECIFIED REACTION  Pt prefers to not take this medication Side effects   Morphine And Related Other (See Comments)   Pt reports hallucinations      Medication List    STOP taking these medications   diazepam 2 MG tablet Commonly known as: Valium   SUMAtriptan 25 MG tablet Commonly known as: IMITREX   topiramate 25 MG tablet Commonly known as: Topamax   topiramate 50 MG tablet Commonly known as: Topamax   traZODone 50 MG tablet Commonly known as: DESYREL     TAKE these medications   Advair HFA 115-21 MCG/ACT inhaler Generic drug: fluticasone-salmeterol USE 2 INHALATIONS BY MOUTH  TWICE DAILY What changed:   how much to take  how to take this  when to take this  additional instructions Notes to patient: To help you breathe easier This is a maintenance inhaler that should be used every day even when you feel fine Wash/rinse out mouth after each use   albuterol 108 (90 Base) MCG/ACT inhaler Commonly known as: VENTOLIN HFA Inhale 2-4 puffs into the lungs every 4 (four) hours as needed for wheezing (or cough). Notes to patient: As needed medication when you need extra help breathing if you are short of breath   ARIPiprazole 5 MG tablet Commonly known as: Abilify Take 1 tablet (5 mg total) by mouth daily. Notes to patient: Helps improve mood   Aspirin Low Dose 81 MG chewable tablet Generic drug: aspirin Chew 1 tablet (81 mg  total) by mouth daily. Start taking on: Dec 20, 2020 Notes to patient: Helps prevent blood clots   atorvastatin 80 MG tablet Commonly known as: LIPITOR Take  1 tablet (80 mg total) by mouth daily. Notes to patient: Lowers high cholesterol   Basaglar KwikPen 100 UNIT/ML Inject 45 Units into the skin daily. What changed:   how much to take  when to take this Notes to patient: Helps lower high blood sugar levels   empagliflozin 25 MG Tabs tablet Commonly known as: Jardiance Take 1 tablet (25 mg total) by mouth daily. Notes to patient: Helps lower high blood sugar levels   EPINEPHrine 0.3 mg/0.3 mL Soaj injection Commonly known as: EPI-PEN Inject 0.3 mg into the muscle once as needed for anaphylaxis (for an anaphylactic reaction). Notes to patient: As needed medication for anaphylactic reactions   FreeStyle Libre 2 Reader Devi 1 each by Does not apply route 6 (six) times daily.   FreeStyle Libre 2 Sensor Misc 1 each by Does not apply route 6 (six) times daily.   furosemide 20 MG tablet Commonly known as: LASIX Take 1 tablet (20 mg total) by mouth 2 (two) times daily. Notes to patient: Helps prevent fluid retention    Glucagon Emergency 1 MG Kit Inject 1 mg into the vein once as needed (FOR ONSET OF HYPOGLYCEMIA). Notes to patient: Helps increase low blood sugars    insulin lispro 100 UNIT/ML KwikPen Commonly known as: HumaLOG KwikPen Inject 10 Units into the skin 3 (three) times daily. What changed:   how much to take  when to take this  additional instructions Notes to patient: Helps lower high blood sugar levels   lamoTRIgine 200 MG tablet Commonly known as: LAMICTAL Take 1 tablet (200 mg total) by mouth daily. Notes to patient: Helps improve mood   levothyroxine 150 MCG tablet Commonly known as: SYNTHROID Take 1 tablet (150 mcg total) by mouth at bedtime. 3:30 Am Notes to patient: Helps prevent low thyroid hormone levels   losartan 100 MG  tablet Commonly known as: COZAAR Take 1 tablet (100 mg total) by mouth daily. Notes to patient: Helps reduce high blood pressure   magic mouthwash Soln Take 2 mLs by mouth 4 (four) times daily.   metoprolol tartrate 50 MG tablet Commonly known as: LOPRESSOR Take 1 tablet (50 mg total) by mouth 2 (two) times daily. Notes to patient: Helps to reduce high blood pressure   multivitamin with minerals Tabs tablet Take 1 tablet by mouth daily. Woman 50 +   nitroGLYCERIN 0.4 MG SL tablet Commonly known as: NITROSTAT Place 1 tablet (0.4 mg total) under the tongue every 5 (five) minutes x 3 doses as needed for chest pain. Notes to patient: Helps to relieve chest pain    pantoprazole 20 MG tablet Commonly known as: Protonix Take 1 tablet (20 mg total) by mouth daily. Notes to patient: Helps to prevent bleeds   PenTips 32G X 4 MM Misc Generic drug: Insulin Pen Needle Use 6 (six) times daily.   potassium chloride SA 20 MEQ tablet Commonly known as: KLOR-CON Take 1 tablet (20 mEq total) by mouth daily. Notes to patient: Helps to prevent low potassium levels   True Metrix Blood Glucose Test test strip Generic drug: glucose blood Use as instructed   venlafaxine XR 37.5 MG 24 hr capsule Commonly known as: EFFEXOR-XR Take 37.5 mg by mouth daily with breakfast. Take with 171m for total of 187.531mdaily What changed: Another medication with the same name was changed. Make sure you understand how and when to take each. Notes to patient: Helps improve mood   venlafaxine XR 150 MG 24 hr capsule Commonly known as:  EFFEXOR-XR Take 1 capsule (150 mg total) by mouth daily. What changed: additional instructions Notes to patient: Helps improve mood   Vitamin D3 25 MCG (1000 UT) Caps Take 1,000-2,000 Units by mouth See admin instructions. Take 2 tablets by mouth once a day on Mon/Wed/Fri and take 1 tablet on Sun/Tues/Thurs/Sat Notes to patient: Helps prevent low vitamin D3 levels             Durable Medical Equipment  (From admission, onward)         Start     Ordered   12/19/20 1112  For home use only DME Bedside commode  Once       Comments: 3 in 1 bedside commode  Question:  Patient needs a bedside commode to treat with the following condition  Answer:  Weakness   12/19/20 1112          Discharge Instructions: Please refer to Patient Instructions section of EMR for full details.  Patient was counseled important signs and symptoms that should prompt return to medical care, changes in medications, dietary instructions, activity restrictions, and follow up appointments.   Follow-Up Appointments:  Follow-up Information    [name], DO. Go on 12/19/2020.   Specialty: Family Medicine Contact information: [address] [place] [address] [phone]        Josue Hector, MD .   Specialty: Cardiology Contact information: [phone] [place] [address] [phone]               Shary Key, DO 12/19/2020, 2:45 PM PGY-1, [place]

## 2020-12-22 ENCOUNTER — Telehealth: Payer: Self-pay

## 2020-12-22 NOTE — Telephone Encounter (Signed)
Pt called stated that she's over an hour and a half away from the office, unable to make it in for apt. Pt has requested a letter to return back to work without restrictions, please call pt to discuss.

## 2020-12-23 ENCOUNTER — Telehealth: Payer: Self-pay | Admitting: Family Medicine

## 2020-12-23 NOTE — Telephone Encounter (Signed)
Entered in error, see other encounter.   Milus Banister, [place], PGY-3 12/23/2020 4:07 PM

## 2020-12-23 NOTE — Telephone Encounter (Signed)
Telephone encounter  I called the patient at the number in her chart, [phone], unfortunate there was no answer and I was unable to leave a voicemail.  I called the physician Dr. Carollee Leitz who recently helped care for the patient while she was hospitalized.  Patient was released from the hospital after treated for DKA on 12/19/2020.  Per the notes patient was going to stay with her friend in [place], [place].  I discussed the patient's request for a work note that allows her "to work without restrictions".  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  Dr. Volanda Napoleon said that the patient's job position, last she knew, was also at home and at a desk.  Is also unsure as to why she would need this letter; however, says the patient on her last evaluation was stable and should be able to return to her previous job without any restriction.  Patient called yesterday 12/22/2020 to cancel her appointment on 12/25/2020 as she is over an hour and a half away and would have a hard time coming in to be seen before returning for work.  I will send the patient a letter stating she can return to work without restriction.  Milus Banister, [place], PGY-3 12/23/2020 4:00 PM

## 2020-12-23 NOTE — Telephone Encounter (Signed)
Routed to PCP. Morgan [name], CMA  
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
## 2020-12-25 ENCOUNTER — Inpatient Hospital Stay: Payer: Managed Care, Other (non HMO) | Admitting: Family Medicine

## 2021-01-01 ENCOUNTER — Encounter: Payer: Self-pay | Admitting: Family Medicine
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
## 2021-01-05 ENCOUNTER — Ambulatory Visit: Payer: Self-pay

## 2021-01-09 ENCOUNTER — Telehealth: Payer: Self-pay

## 2021-01-09 NOTE — Telephone Encounter (Signed)
Patient returns call to nurse line stating she went to [facility] and was told to go to the ED. Patient states she is not going to the ED. Patient stated she took her short acting insulin before eating taco meat and cheese. Patient took her blood sugar ~2 hours after this and 313. Patient advised if symptoms continue and sugars rise again to report to the ED. Patient voiced understanding.

## 2021-01-09 NOTE — Telephone Encounter (Signed)
Patient calls nurse line reporting high CBGs. Patient reports she has not been feeling well for the last 3 days. Patient reports lightheadedness, dizziness, and nausea without vomiting. Patient took 40u of basaglar before bed last night and woke up to a fasting CBG of 381. Patient reports she took another 40u of basaglar and checked sugars ~2 hours 411. Patient offered an apt for today, however she now lives 1.45 hours away. Patient advised to look up UCs around her area and to be evaluated today. Patient offered PCP apt for next week, however declined again due to drive. Patient encouraged to find a closer PCP.

## 2021-01-27 ENCOUNTER — Ambulatory Visit: Payer: Managed Care, Other (non HMO) | Admitting: Licensed Clinical Social Worker

## 2021-01-27 DIAGNOSIS — Z789 Other specified health status: Secondary | ICD-10-CM

## 2021-01-27 NOTE — Chronic Care Management (AMB) (Signed)
Care Management   Clinical Social Work Note  01/27/2021 Name: Morgan [name] MRN: [id number] DOB: 1966-02-01  Morgan [name] is a 55 y.o. year old female who is a primary care patient of [name], DO. The CCM team was consulted to assist the patient with chronic disease management and/or care coordination needs related to: [employer] .   Engaged with patient by telephone for follow up visit in response to provider referral for social work chronic care management and care coordination services.   Consent to Services: Patient agreed to services and consent obtained.   Assessment: Patient is engaged in conversation, continues to maintain positive progress with care plan goals. She has connected with a new PCP but not able to establish care until Sept.. [place] below for interventions and patient self-care actives. Recent life changes /stressors: recently relocated to [facility].  Follow up Plan: Patient has met all care plan goals, and does not require or desire continued follow-up due to relocating to another county.  LCSW will disconnect from patient's care team.  Patient will contact the office if needed prior to appointment with new medical provider.    Review of patient past medical history, allergies, medications, and health status, including review of relevant consultants reports was performed today as part of a comprehensive evaluation and provision of chronic care management and care coordination services.     SDOH (Social Determinants of Health) assessments and interventions performed:    Advanced Directives Status: Not addressed in this encounter.  CCM Care Plan  Allergies  Allergen Reactions   Ilosone [Erythromycin] Anaphylaxis   Keflex [Cephalexin] Anaphylaxis   Penicillins Anaphylaxis    Has patient had a PCN reaction causing immediate rash, facial/tongue/throat swelling, SOB or lightheadedness with hypotension: YES Has patient had a PCN  reaction causing severe rash involving mucus membranes or skin necrosis: NO Has patient had a PCN reaction that required hospitalizationNO Has patient had a PCN reaction occurring within the last 10 years: NO If all of the above answers are "NO", then may proceed with Cephalosporin use.   Iron Other (See Comments)    Pt reports condition limiting Iron intake.   Metformin And Related Other (See Comments)    UNSPECIFIED REACTION  Pt prefers to not take this medication Side effects   Morphine And Related Other (See Comments)    Pt reports hallucinations    Outpatient Encounter Medications as of 01/27/2021  Medication Sig   albuterol (VENTOLIN HFA) 108 (90 Base) MCG/ACT inhaler Inhale 2-4 puffs into the lungs every 4 (four) hours as needed for wheezing (or cough).   ARIPiprazole (ABILIFY) 5 MG tablet Take 1 tablet (5 mg total) by mouth daily.   aspirin 81 MG chewable tablet Chew 1 tablet (81 mg total) by mouth daily.   atorvastatin (LIPITOR) 80 MG tablet Take 1 tablet (80 mg total) by mouth daily.   Cholecalciferol (VITAMIN D3) 25 MCG (1000 UT) CAPS Take 1,000-2,000 Units by mouth See admin instructions. Take 2 tablets by mouth once a day on Mon/Wed/Fri and take 1 tablet on Sun/Tues/Thurs/Sat   Continuous Blood Gluc Receiver (FREESTYLE LIBRE 2 READER) DEVI 1 each by Does not apply route 6 (six) times daily.   Continuous Blood Gluc Sensor (FREESTYLE LIBRE 2 SENSOR) MISC 1 each by Does not apply route 6 (six) times daily.   empagliflozin (JARDIANCE) 25 MG TABS tablet Take 1 tablet (25 mg total) by mouth daily.   EPINEPHrine 0.3 mg/0.3 mL IJ SOAJ injection  Inject 0.3 mg into the muscle once as needed for anaphylaxis (for an anaphylactic reaction).   fluticasone-salmeterol (ADVAIR HFA) 115-21 MCG/ACT inhaler USE 2 INHALATIONS BY MOUTH  TWICE DAILY (Patient taking differently: No sig reported)   furosemide (LASIX) 20 MG tablet Take 1 tablet (20 mg total) by mouth 2 (two) times daily.   Glucagon,  rDNA, (GLUCAGON EMERGENCY) 1 MG KIT Inject 1 mg into the vein once as needed (FOR ONSET OF HYPOGLYCEMIA).   glucose blood (TRUE METRIX BLOOD GLUCOSE TEST) test strip Use as instructed   Insulin Glargine (BASAGLAR KWIKPEN) 100 UNIT/ML Inject 45 Units into the skin daily.   insulin lispro (HUMALOG KWIKPEN) 100 UNIT/ML KwikPen Inject 10 Units into the skin 3 (three) times daily.   Insulin Pen Needle 32G X 4 MM MISC Use 6 (six) times daily.   lamoTRIgine (LAMICTAL) 200 MG tablet Take 1 tablet (200 mg total) by mouth daily.   levothyroxine (SYNTHROID) 150 MCG tablet Take 1 tablet (150 mcg total) by mouth at bedtime. 3:30 Am   losartan (COZAAR) 100 MG tablet Take 1 tablet (100 mg total) by mouth daily.   magic mouthwash SOLN Take 2 mLs by mouth 4 (four) times daily.   metoprolol tartrate (LOPRESSOR) 50 MG tablet Take 1 tablet (50 mg total) by mouth 2 (two) times daily.   Multiple Vitamin (MULTIVITAMIN WITH MINERALS) TABS tablet Take 1 tablet by mouth daily. Woman 50 +   nitroGLYCERIN (NITROSTAT) 0.4 MG SL tablet Place 1 tablet (0.4 mg total) under the tongue every 5 (five) minutes x 3 doses as needed for chest pain.   pantoprazole (PROTONIX) 20 MG tablet Take 1 tablet (20 mg total) by mouth daily.   potassium chloride SA (KLOR-CON) 20 MEQ tablet Take 1 tablet (20 mEq total) by mouth daily.   venlafaxine XR (EFFEXOR-XR) 150 MG 24 hr capsule Take 1 capsule (150 mg total) by mouth daily. (Patient taking differently: Take 150 mg by mouth daily. Take with 37.31m for total of 187.536mdaily)   venlafaxine XR (EFFEXOR-XR) 37.5 MG 24 hr capsule Take 37.5 mg by mouth daily with breakfast. Take with 15029mor total of 187.5mg72mily   No facility-administered encounter medications on file as of 01/27/2021.    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   Microcytosis  09/18/2020   Leg swelling 09/10/2020   Oral pharyngeal candidiasis 08/31/2020   Chronic left shoulder pain 08/17/2020   Hyperglycemia 07/08/2020   Type 2 diabetes mellitus with hyperglycemia (HCC)[place]/01/2020   No-show for appointment 05/12/2020   Cervical spinal stenosis 10/09/2019   Status post carotid endarterectomy 07/30/2019   Subclavian artery stenosis, left (HCC)[place]/28/2020   Migraines 07/16/2019   Vitamin D deficiency 05/19/2019   Hyperglycemia due to diabetes mellitus (HCC)[place]/17/2020   S/P coronary artery stent placement    Hyperlipidemia LDL goal <70    Tachycardia 07/22/2018   Hepatic steatosis    Mild renal insufficiency 07/21/2018   Leukocytosis 07/21/2018   Bipolar depression (HCC)[place]/20/2019   Incidental lung nodule 07/21/2018   Prolonged QT interval 07/21/2018   Hyperlipidemia 05/14/2016   Hypothyroidism 05/14/2016   Essential hypertension 05/14/2016    Conditions to be addressed/monitored: community support  Care Plan : Social Work  Updates made by MoorMaurine CaneSW since 01/27/2021 12:00 AM   Problem: Barriers to Treatment with  obtaining medication    Goal: Obtain needed medication Completed 01/27/2021  Start Date: 10/14/2020  Recent Progress: On track  Priority: High  Current barriers:   Goal completed patient has insurance  Patient in need of assistance with connecting to community resources for assistance with medication  Patient is unable to independently navigate community resource options without care coordination support Clinical Goals: patient will work with SW to address concerns related to getting medication  Clinical Interventions:  Inter-disciplinary care team collaboration (see longitudinal plan of care) Assessment of needs, barriers as well as how impacting  Collaborated with appropriate clinical care team members regarding patient needs ( [place] team) Other interventions provided:Emotional/Supportive Counseling Patient  Goals/Self-Care Activities: none at this time     [name], [place] / [place]   [phone] 11:44 AM

## 2021-01-27 NOTE — Patient Instructions (Signed)
Visit Information   Goals Addressed             This Visit's Progress    COMPLETED: Find Help in My Community       You have completed all of the goals we were working on.  Good luck with  Your new move.         Patient verbalizes understanding of instructions provided today and agrees to view in [place].   No further follow up required: by [place], [place] Management & Coordination  [phone]

## 2021-02-04 ENCOUNTER — Other Ambulatory Visit: Payer: Self-pay | Admitting: Family Medicine

## 2021-02-06 NOTE — Telephone Encounter (Signed)
Needs appt

## 2021-02-11 ENCOUNTER — Telehealth: Payer: Self-pay

## 2021-02-11 NOTE — Telephone Encounter (Signed)
Patient calls nurse line reporting N/V and dizziness. Patient reports she has been compliant with insulin and her sugars have been ranging from 110-230, most recent 123. Patient reports symptoms since yesterday. Patient denies excessive thirst, fatigue, headaches, or frequent urination. Patient lives far away and is unable to make an apt. Patient advised to go to UC for her symptoms with her hx of DKA. Patient reminded to find a closer PCP for DM management.

## 2021-03-26 ENCOUNTER — Other Ambulatory Visit: Payer: Self-pay | Admitting: Pharmacist

## 2021-03-26 DIAGNOSIS — G43709 Chronic migraine without aura, not intractable, without status migrainosus: Secondary | ICD-10-CM

## 2021-03-26 NOTE — Telephone Encounter (Signed)
This medication was previously discontinued by last PCP.  If patient would like a refill she can schedule a visit in the clinic.  Thank you.

## 2021-03-30 ENCOUNTER — Other Ambulatory Visit: Payer: Self-pay

## 2021-03-31 ENCOUNTER — Other Ambulatory Visit: Payer: Self-pay | Admitting: Pharmacist

## 2021-03-31 DIAGNOSIS — I10 Essential (primary) hypertension: Secondary | ICD-10-CM

## 2021-03-31 DIAGNOSIS — E039 Hypothyroidism, unspecified: Secondary | ICD-10-CM

## 2021-04-09 ENCOUNTER — Other Ambulatory Visit: Payer: Self-pay

## 2021-04-11 MED ORDER — FUROSEMIDE 20 MG PO TABS: 20.0000 mg | ORAL_TABLET | Freq: Two times a day (BID) | ORAL | 3 refills | Status: AC

## 2021-05-13 ENCOUNTER — Other Ambulatory Visit: Payer: Self-pay | Admitting: Family Medicine

## 2021-05-13 DIAGNOSIS — E1165 Type 2 diabetes mellitus with hyperglycemia: Secondary | ICD-10-CM

## 2021-06-23 ENCOUNTER — Other Ambulatory Visit: Payer: Self-pay

## 2021-06-23 DIAGNOSIS — E1165 Type 2 diabetes mellitus with hyperglycemia: Secondary | ICD-10-CM

## 2021-06-23 DIAGNOSIS — Z794 Long term (current) use of insulin: Secondary | ICD-10-CM

## 2021-06-24 MED ORDER — INSULIN LISPRO (1 UNIT DIAL) 100 UNIT/ML (KWIKPEN): 10.0000 [IU] | PEN_INJECTOR | Freq: Three times a day (TID) | SUBCUTANEOUS | 10 refills | Status: AC

## 2021-12-08 IMAGING — MR MR CERVICAL SPINE WO/W CM
10 of 12 series · 27 of 48 positions shown · IV contrast (Eovist)
Comparison: None.

CLINICAL DATA: Paresthesias from shoulders to hands. History of
carotid subclavian bypass and Jansen

EXAM:
MRI CERVICAL SPINE WITHOUT AND WITH CONTRAST
TECHNIQUE: Multiplanar and multiecho pulse sequences of the cervical spine, to
include the craniocervical junction and cervicothoracic junction,
were obtained without and with intravenous contrast.
CONTRAST:  9mL GADAVIST GADOBUTROL 1 MMOL/ML IV SOLN

[Series 5: T2 · sagittal · 3.0mm · 0.69mm/px · 2 of 15 slices shown (1 of 2)]
[im 1/15]
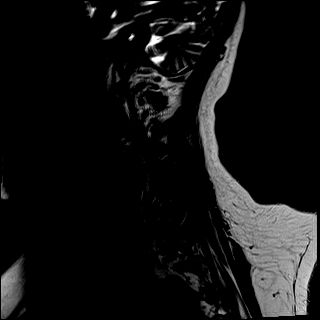
[im 15/15]
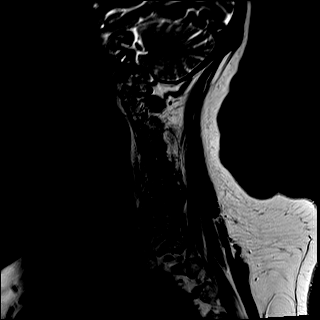

[Series 6: T1 · sagittal · 3.0mm · 0.69mm/px · 2 of 15 slices shown (1 of 3)]
[im 1/15]
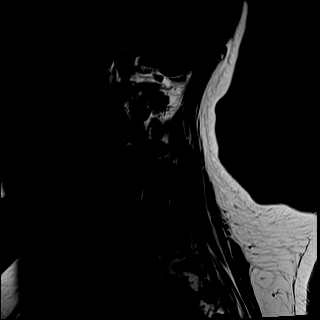
[im 15/15]
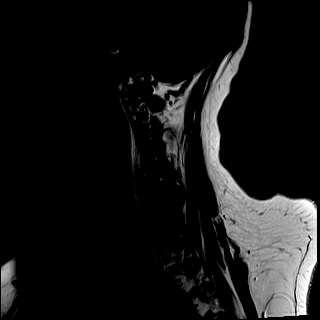

[Series 8: T2 · axial · 3.0mm · 0.66mm/px · z∈[-116,+4]mm · 3 of 40 slices shown (2 of 2)]
[im 1/40]
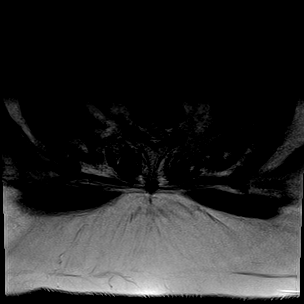
[im 20/40]
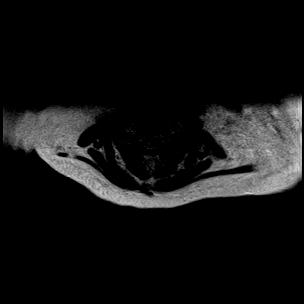
[im 40/40]
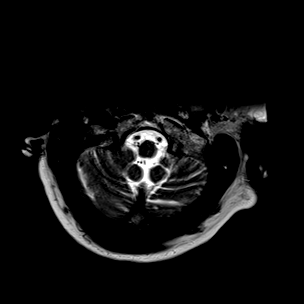

[Series 9: GRE · axial · 3.0mm · 0.39mm/px · z∈[-116,+4]mm · 3 of 40 slices shown]
[im 1/40]
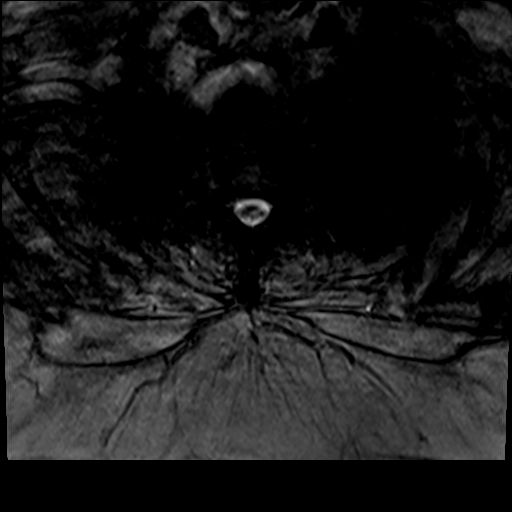
[im 20/40]
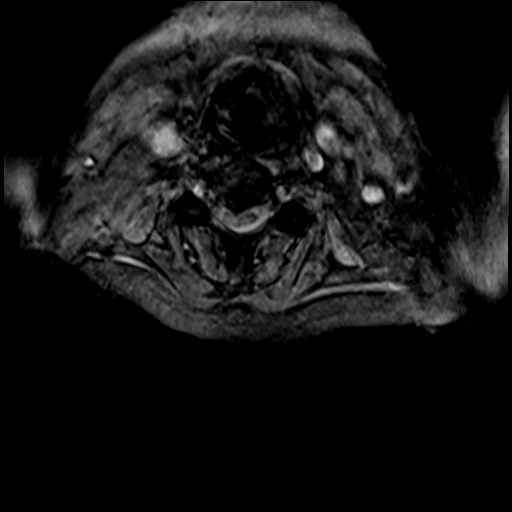
[im 40/40]
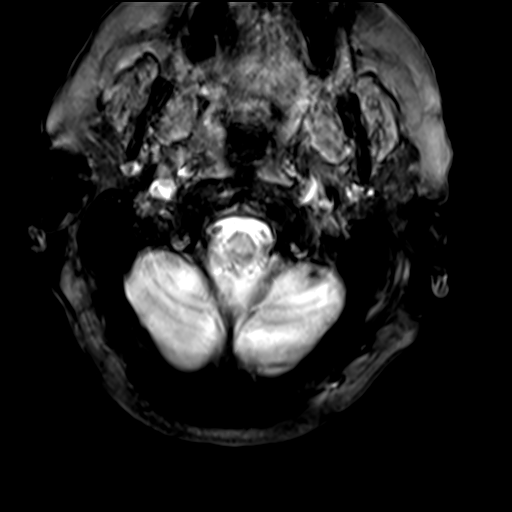

[Series 10: STIR · sagittal · 3.0mm · 0.86mm/px · 1 of 15 slices shown]
[im 1/15]
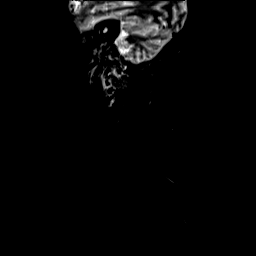

[Series 11: T1 · axial · 3.0mm · 0.39mm/px · z∈[-116,+4]mm · 3 of 40 slices shown (2 of 3)]
[im 1/40]
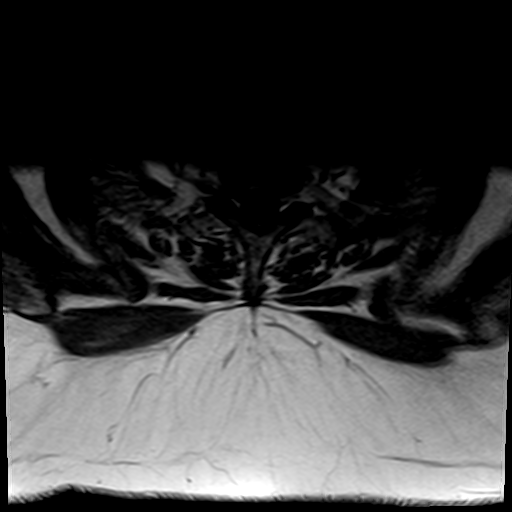
[im 20/40]
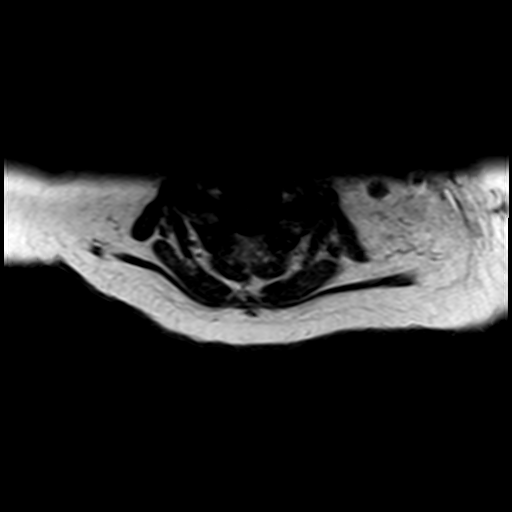
[im 40/40]
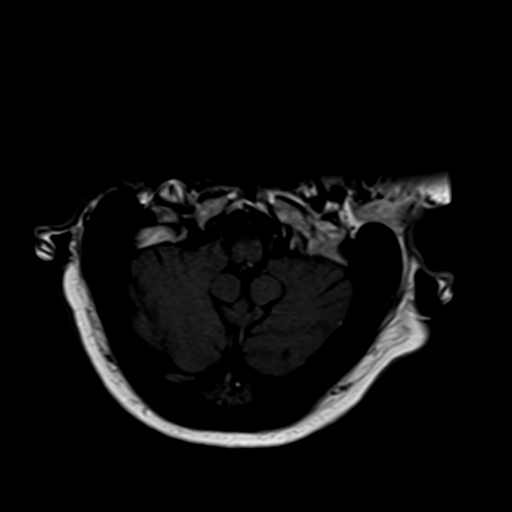

[Series 12: T1 · axial · 3.0mm · 0.39mm/px · z∈[-116,+4]mm · 3 of 40 slices shown (3 of 3)]
[im 1/40]
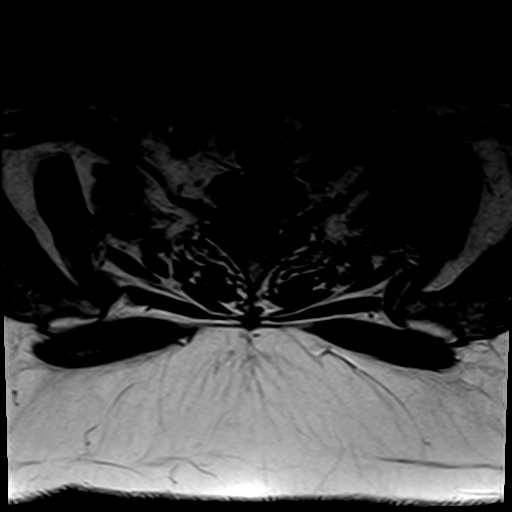
[im 20/40]
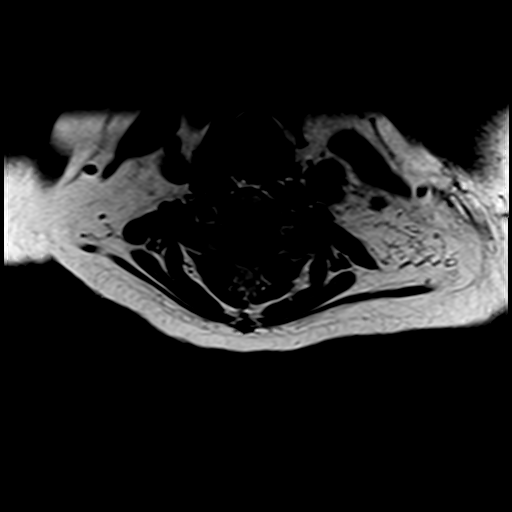
[im 40/40]
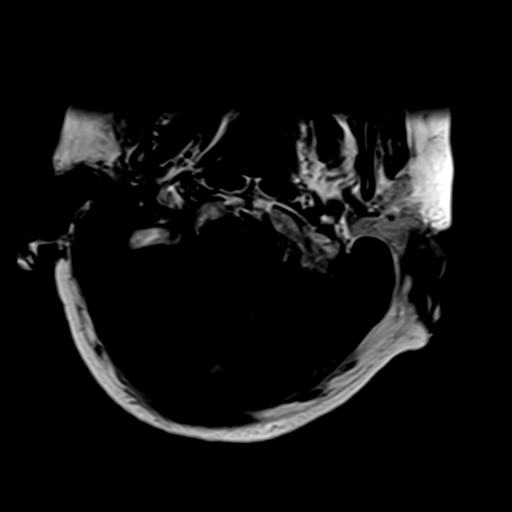

[Series 20: tof_fl3d_tra_iso · axial · 0.6mm · 0.52mm/px · z∈[-96,-17]mm · 6 of 195 slices shown]
[im 1/195]
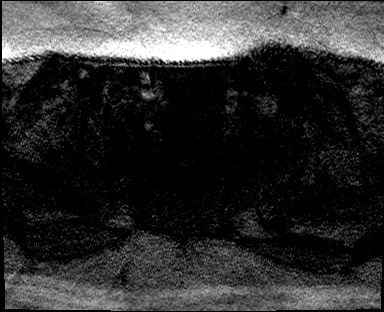
[im 28/195]
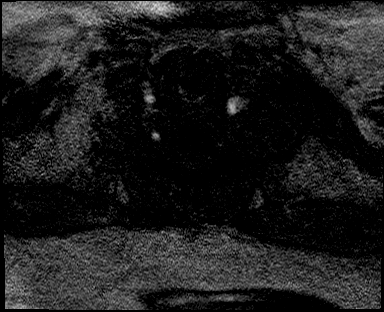
[im 56/195]
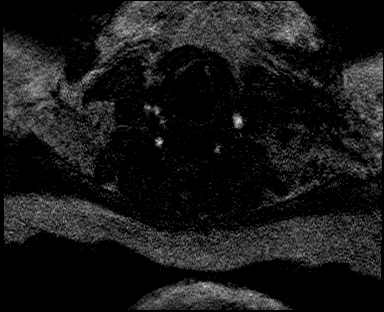
[im 84/195]
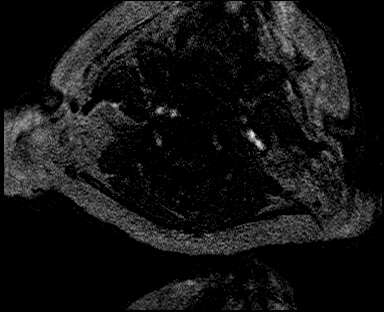
[im 111/195]
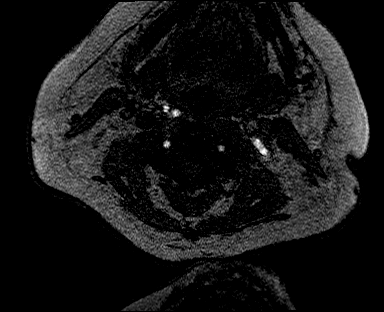
[im 139/195]
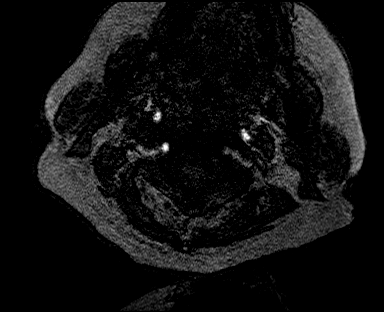

[Series 27: T1 fat-sat post-contrast · sagittal · 3.0mm · 0.43mm/px · 1 of 15 slices shown]
[im 1/15]
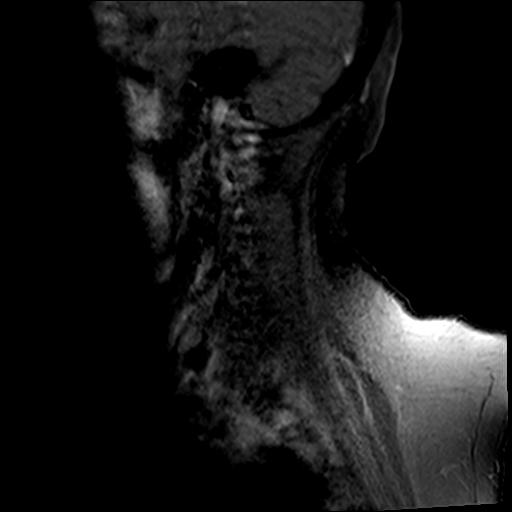

[Series 28: T1 post-contrast · axial · 3.0mm · 0.39mm/px · z∈[-116,+4]mm · 3 of 40 slices shown]
[im 1/40]
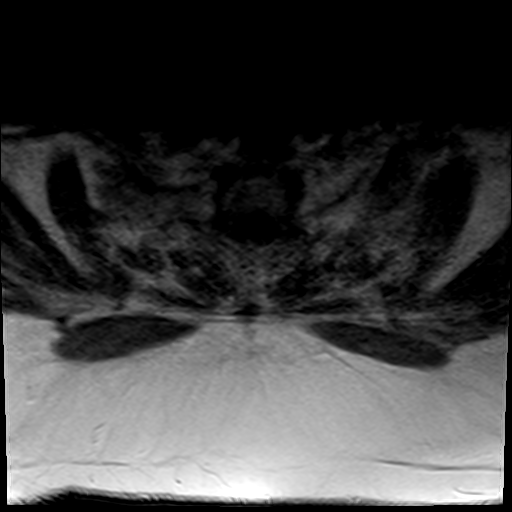
[im 20/40]
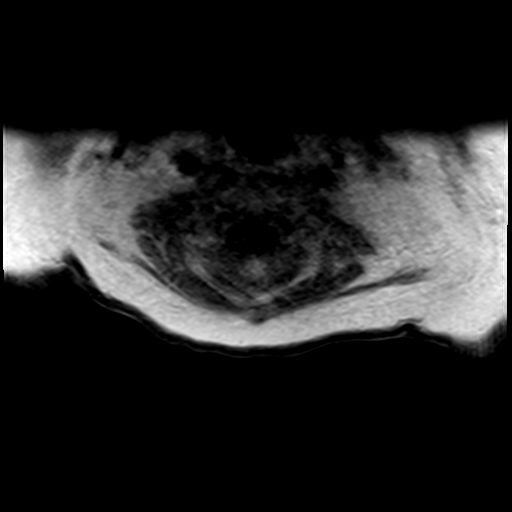
[im 40/40]
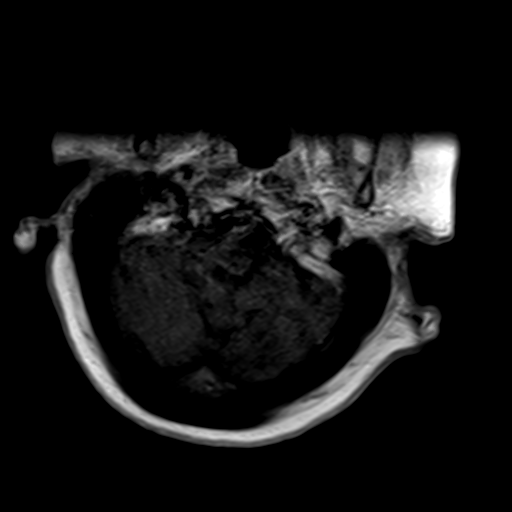

[27 of 48 positions shown; findings below may reference images not displayed]

FINDINGS: Alignment: Reversal of cervical lordosis, chronic and degenerative
appearing

Vertebrae: No fracture, evidence of discitis, or bone lesion.

Cord: Normal signal and morphology.

Posterior Fossa, vertebral arteries, paraspinal tissues: There is
amorphous soft tissue around the left carotid to subclavian bypass,
especially inferiorly. Although this area appears fusiform lead
dilated, no pseudoaneurysm is seen on MRA which is described
separately. No discrete rim enhancing collection. Notable to the
history this is in the area of traversing brachial plexus. Mild
regional adenopathy suggesting a degree of chronicity.

Disc levels:

C4-5 and C5-6 disc narrowing with shallow central protrusions
contacting but not deforming the cord. Diffusely patent foramina.
IMPRESSION: 1. Infiltrated soft tissues around the left carotid subclavian
bypass which is widely patent and without pseudoaneurysm on
contemporaneous neck MRA. Soft tissue infiltration is closely
associated with the brachial plexus and could explain paresthesias.
This could reflect perioperative hemorrhage or surgical site
infection.
2. No impingement in the cervical spine.

## 2022-01-05 ENCOUNTER — Encounter: Payer: Self-pay | Admitting: *Deleted

## 2022-08-04 ENCOUNTER — Telehealth: Payer: Self-pay

## 2022-08-04 NOTE — Telephone Encounter (Signed)
Patient has transferred Juanda Crumble, [place] Direct Dial [phone]

## 2022-08-05 ENCOUNTER — Other Ambulatory Visit (HOSPITAL_COMMUNITY): Payer: Self-pay

## 2023-04-26 ENCOUNTER — Telehealth: Payer: Self-pay

## 2023-04-26 NOTE — Transitions of Care (Post Inpatient/ED Visit) (Signed)
04/26/2023  Name: Morgan [name] MRN: [id number] DOB: 1966-05-14  Today's TOC FU Call Status: Today's TOC FU Call Status:: Successful TOC FU Call Completed TOC FU Call Complete Date: 04/26/23 Patient's Name and Date of Birth confirmed.  Transition Care Management Follow-up Telephone Call Date of Discharge: 04/25/23 Discharge Facility: Other [facility]) Name of Other (Non[facility]) Discharge Facility: [facility] Type of Discharge: Inpatient Admission Primary Inpatient Discharge Diagnosis:: chest pain How have you been since you were released from the hospital?: Better Any questions or concerns?: No  Items Reviewed: Did you receive and understand the discharge instructions provided?: Yes Medications obtained,verified, and reconciled?: Yes (Medications Reviewed) Any new allergies since your discharge?: No Dietary orders reviewed?: Yes Do you have support at home?: No  Medications Reviewed Today: Medications Reviewed Today     Reviewed by Karena Addison, LPN (Licensed Practical Nurse) on 04/26/23 at [phone]  Med List Status: <None>   Medication Order Taking? Sig Documenting Provider Last Dose Status Informant  albuterol (VENTOLIN HFA) 108 (90 Base) MCG/ACT inhaler [id number]  Inhale 2-4 puffs into the lungs every 4 (four) hours as needed for wheezing (or cough). Alfredo Martinez, MD  Active   ARIPiprazole (ABILIFY) 5 MG tablet [id number] No Take 1 tablet (5 mg total) by mouth daily. Dollene Cleveland, DO unk Active Child  aspirin 81 MG chewable tablet [id number]  Chew 1 tablet (81 mg total) by mouth daily. Cora Collum, DO  Active   atorvastatin (LIPITOR) 80 MG tablet [id number]  Take 1 tablet (80 mg total) by mouth daily. Alfredo Martinez, MD  Active   Cholecalciferol (VITAMIN D3) 25 MCG (1000 UT) CAPS [id number] No Take 1,000-2,000 Units by mouth See admin instructions. Take 2 tablets by mouth once a day on Mon/Wed/Fri and take 1 tablet on Sun/Tues/Thurs/Sat [provider] unk  Active Child  Continuous Blood Gluc Receiver (FREESTYLE LIBRE 2 READER) DEVI [id number] No 1 each by Does not apply route 6 (six) times daily. Dollene Cleveland, DO Taking Active Child  Continuous Blood Gluc Sensor (FREESTYLE LIBRE 2 SENSOR) MISC [id number] No 1 each by Does not apply route 6 (six) times daily. Dollene Cleveland, DO Taking Active Child  empagliflozin (JARDIANCE) 25 MG TABS tablet [id number] No Take 1 tablet (25 mg total) by mouth daily. Simmons-Robinson, Makiera, MD unk Active Child  EPINEPHrine 0.3 mg/0.3 mL IJ SOAJ injection [id number] No Inject 0.3 mg into the muscle once as needed for anaphylaxis (for an anaphylactic reaction). Kathrin Ruddy, RPH-CPP unk Active Child  fluticasone-salmeterol (ADVAIR HFA) 182-99 MCG/ACT inhaler [id number] No USE 2 INHALATIONS BY MOUTH  TWICE DAILY  Patient taking differently: No sig reported   Dollene Cleveland, DO unk Active   furosemide (LASIX) 20 MG tablet [id number]  Take 1 tablet (20 mg total) by mouth 2 (two) times daily. Alfredo Martinez, MD  Active   Glucagon, rDNA, (GLUCAGON EMERGENCY) 1 MG KIT [id number] No Inject 1 mg into the vein once as needed (FOR ONSET OF HYPOGLYCEMIA). Dollene Cleveland, DO unk Active Child  glucose blood (TRUE METRIX BLOOD GLUCOSE TEST) test strip [id number] No Use as instructed Simmons-Robinson, Makiera, MD Taking Active Child  Insulin Glargine (BASAGLAR KWIKPEN) 100 UNIT/ML [id number]  Inject 45 Units into the skin daily. Cora Collum, DO  Active   insulin lispro (HUMALOG) 100 UNIT/ML KwikPen [id number]  Inject 10 Units into the skin 3 (three) times daily. Humalog 10u for small meals/snack or 30 units for larger/sugary meals 3-4 times daily Alfredo Martinez, MD  Active   Insulin Pen Needle 32G X 4 MM MISC [id number]  Use 6 (six) times daily. Westley Chandler, MD  Active   lamoTRIgine (LAMICTAL) 200 MG tablet [id number] No Take 1 tablet (200 mg total) by mouth daily. Dollene Cleveland, DO unk Active Child  levothyroxine  (SYNTHROID) 150 MCG tablet [id number]  Take 1 tablet (150 mcg total) by mouth at bedtime. 3:30 Am Alfredo Martinez, MD  Active   losartan (COZAAR) 100 MG tablet [id number]  Take 1 tablet (100 mg total) by mouth daily. Alfredo Martinez, MD  Active   magic mouthwash SOLN [id number]  Take 2 mLs by mouth 4 (four) times daily. Cora Collum, DO  Active   metoprolol tartrate (LOPRESSOR) 50 MG tablet [id number]  Take 1 tablet (50 mg total) by mouth 2 (two) times daily. Alfredo Martinez, MD  Active   Multiple Vitamin (MULTIVITAMIN WITH MINERALS) TABS tablet [id number] No Take 1 tablet by mouth daily. Woman 50 + [provider] unk Active Child  nitroGLYCERIN (NITROSTAT) 0.4 MG SL tablet [id number] No Place 1 tablet (0.4 mg total) under the tongue every 5 (five) minutes x 3 doses as needed for chest pain. Kathrin Ruddy, RPH-CPP unk Active Child  pantoprazole (PROTONIX) 20 MG tablet [id number]  Take 1 tablet (20 mg total) by mouth daily. Cora Collum, DO  Active   potassium chloride SA (KLOR-CON) 20 MEQ tablet [id number] No Take 1 tablet (20 mEq total) by mouth daily. Lilland, Alana, DO unk Active Child  venlafaxine XR (EFFEXOR-XR) 150 MG 24 hr capsule [id number] No Take 1 capsule (150 mg total) by mouth daily.  Patient taking differently: Take 150 mg by mouth daily. Take with 37.5mg  for total of 187.5mg  daily   Dollene Cleveland, DO unk Active Pharmacy Records  venlafaxine XR (EFFEXOR-XR) 37.5 MG 24 hr capsule [id number]  Take 37.5 mg by mouth daily with breakfast. Take with 150mg  for total of 187.5mg  daily [provider]  Active             Home Care and Equipment/Supplies: Were Home Health Services Ordered?: NA Any new equipment or medical supplies ordered?: NA  Functional Questionnaire: Do you need assistance with bathing/showering or dressing?: No Do you need assistance with meal preparation?: No Do you need assistance with eating?: No Do you have difficulty maintaining  continence: No Do you need assistance with getting out of bed/getting out of a chair/moving?: No Do you have difficulty managing or taking your medications?: No  Follow up appointments reviewed: PCP Follow-up appointment confirmed?: No (patient has moved, transferred PCP) MD Provider Line Number:[phone] Given: No Specialist Hospital Follow-up appointment confirmed?: NA Do you need transportation to your follow-up appointment?: No Do you understand care options if your condition(s) worsen?: Yes-patient verbalized understanding    SIGNATURE Karena Addison, LPN [facility] Nurse Health Advisor Direct Dial [phone]
